# Patient Record
Sex: Female | Born: 1955 | Race: Black or African American | Hispanic: No | Smoking: Never smoker
Health system: Southern US, Community
[De-identification: ages and names within clinical notes are randomized; demographics above are authoritative.]

## PROBLEM LIST (undated history)

## (undated) DIAGNOSIS — M542 Cervicalgia: Secondary | ICD-10-CM

## (undated) DIAGNOSIS — F419 Anxiety disorder, unspecified: Secondary | ICD-10-CM

## (undated) DIAGNOSIS — N200 Calculus of kidney: Secondary | ICD-10-CM

## (undated) DIAGNOSIS — J939 Pneumothorax, unspecified: Secondary | ICD-10-CM

## (undated) DIAGNOSIS — M419 Scoliosis, unspecified: Secondary | ICD-10-CM

## (undated) DIAGNOSIS — N19 Unspecified kidney failure: Secondary | ICD-10-CM

## (undated) DIAGNOSIS — R011 Cardiac murmur, unspecified: Secondary | ICD-10-CM

## (undated) DIAGNOSIS — J45909 Unspecified asthma, uncomplicated: Secondary | ICD-10-CM

## (undated) DIAGNOSIS — M1990 Unspecified osteoarthritis, unspecified site: Secondary | ICD-10-CM

## (undated) DIAGNOSIS — I10 Essential (primary) hypertension: Secondary | ICD-10-CM

## (undated) DIAGNOSIS — A048 Other specified bacterial intestinal infections: Secondary | ICD-10-CM

## (undated) DIAGNOSIS — K219 Gastro-esophageal reflux disease without esophagitis: Secondary | ICD-10-CM

## (undated) DIAGNOSIS — N952 Postmenopausal atrophic vaginitis: Secondary | ICD-10-CM

## (undated) DIAGNOSIS — J189 Pneumonia, unspecified organism: Secondary | ICD-10-CM

## (undated) DIAGNOSIS — I639 Cerebral infarction, unspecified: Secondary | ICD-10-CM

## (undated) DIAGNOSIS — J40 Bronchitis, not specified as acute or chronic: Secondary | ICD-10-CM

## (undated) DIAGNOSIS — R310 Gross hematuria: Secondary | ICD-10-CM

## (undated) DIAGNOSIS — J302 Other seasonal allergic rhinitis: Secondary | ICD-10-CM

## (undated) DIAGNOSIS — R350 Frequency of micturition: Secondary | ICD-10-CM

## (undated) DIAGNOSIS — J984 Other disorders of lung: Secondary | ICD-10-CM

## (undated) HISTORY — PX: NECK SURGERY: SHX720

## (undated) HISTORY — PX: BREAST BIOPSY: SHX20

## (undated) HISTORY — DX: Calculus of kidney: N20.0

## (undated) HISTORY — DX: Essential (primary) hypertension: I10

## (undated) HISTORY — DX: Pneumothorax, unspecified: J93.9

## (undated) HISTORY — PX: CORONARY ARTERY BYPASS GRAFT: SHX141

## (undated) HISTORY — PX: APPENDECTOMY: SHX54

## (undated) HISTORY — DX: Postmenopausal atrophic vaginitis: N95.2

## (undated) HISTORY — DX: Gastro-esophageal reflux disease without esophagitis: K21.9

## (undated) HISTORY — DX: Bronchitis, not specified as acute or chronic: J40

## (undated) HISTORY — DX: Other disorders of lung: J98.4

## (undated) HISTORY — DX: Unspecified osteoarthritis, unspecified site: M19.90

## (undated) HISTORY — DX: Cervicalgia: M54.2

## (undated) HISTORY — DX: Anxiety disorder, unspecified: F41.9

## (undated) HISTORY — DX: Cardiac murmur, unspecified: R01.1

## (undated) HISTORY — DX: Scoliosis, unspecified: M41.9

## (undated) HISTORY — DX: Pneumonia, unspecified organism: J18.9

## (undated) HISTORY — PX: FOOT SURGERY: SHX648

## (undated) HISTORY — DX: Frequency of micturition: R35.0

## (undated) HISTORY — PX: ABDOMINAL HYSTERECTOMY: SHX81

## (undated) HISTORY — DX: Gross hematuria: R31.0

## (undated) HISTORY — DX: Other specified bacterial intestinal infections: A04.8

## (undated) HISTORY — DX: Unspecified asthma, uncomplicated: J45.909

## (undated) HISTORY — DX: Unspecified kidney failure: N19

---

## 2001-12-06 ENCOUNTER — Ambulatory Visit (HOSPITAL_COMMUNITY): Admission: RE | Admit: 2001-12-06 | Discharge: 2001-12-06 | Payer: Self-pay | Admitting: Orthopedic Surgery

## 2001-12-06 ENCOUNTER — Encounter: Payer: Self-pay | Admitting: Orthopedic Surgery

## 2001-12-22 ENCOUNTER — Encounter: Admission: RE | Admit: 2001-12-22 | Discharge: 2002-01-25 | Payer: Self-pay | Admitting: Orthopedic Surgery

## 2002-01-08 ENCOUNTER — Ambulatory Visit (HOSPITAL_COMMUNITY): Admission: RE | Admit: 2002-01-08 | Discharge: 2002-01-08 | Payer: Self-pay | Admitting: Orthopedic Surgery

## 2002-01-15 ENCOUNTER — Ambulatory Visit (HOSPITAL_COMMUNITY): Admission: RE | Admit: 2002-01-15 | Discharge: 2002-01-15 | Payer: Self-pay | Admitting: Orthopedic Surgery

## 2002-01-15 ENCOUNTER — Encounter: Payer: Self-pay | Admitting: Orthopedic Surgery

## 2002-03-26 ENCOUNTER — Ambulatory Visit (HOSPITAL_COMMUNITY): Admission: RE | Admit: 2002-03-26 | Discharge: 2002-03-26 | Payer: Self-pay | Admitting: Orthopedic Surgery

## 2002-03-26 ENCOUNTER — Encounter: Payer: Self-pay | Admitting: Orthopedic Surgery

## 2002-12-30 ENCOUNTER — Encounter: Admission: RE | Admit: 2002-12-30 | Discharge: 2002-12-30 | Payer: Self-pay | Admitting: Neurosurgery

## 2002-12-30 ENCOUNTER — Encounter: Payer: Self-pay | Admitting: Neurosurgery

## 2003-02-08 ENCOUNTER — Ambulatory Visit (HOSPITAL_COMMUNITY): Admission: RE | Admit: 2003-02-08 | Discharge: 2003-02-09 | Payer: Self-pay | Admitting: Neurosurgery

## 2003-04-14 ENCOUNTER — Encounter: Admission: RE | Admit: 2003-04-14 | Discharge: 2003-04-14 | Payer: Self-pay | Admitting: Neurosurgery

## 2003-11-04 ENCOUNTER — Encounter: Admission: RE | Admit: 2003-11-04 | Discharge: 2003-11-04 | Payer: Self-pay | Admitting: Neurosurgery

## 2004-01-11 ENCOUNTER — Ambulatory Visit: Payer: Self-pay | Admitting: Pain Medicine

## 2004-02-07 ENCOUNTER — Ambulatory Visit: Payer: Self-pay | Admitting: Pain Medicine

## 2004-02-15 ENCOUNTER — Ambulatory Visit: Payer: Self-pay | Admitting: Pain Medicine

## 2004-03-07 ENCOUNTER — Encounter: Admission: RE | Admit: 2004-03-07 | Discharge: 2004-03-07 | Payer: Self-pay | Admitting: Neurosurgery

## 2004-03-15 ENCOUNTER — Ambulatory Visit: Payer: Self-pay | Admitting: Pain Medicine

## 2004-03-28 ENCOUNTER — Ambulatory Visit: Payer: Self-pay | Admitting: Pain Medicine

## 2004-04-12 ENCOUNTER — Ambulatory Visit: Payer: Self-pay | Admitting: Pain Medicine

## 2004-04-23 ENCOUNTER — Ambulatory Visit: Payer: Self-pay | Admitting: Pain Medicine

## 2004-05-29 ENCOUNTER — Ambulatory Visit: Payer: Self-pay | Admitting: Pain Medicine

## 2004-05-29 ENCOUNTER — Ambulatory Visit: Payer: Self-pay

## 2004-06-04 ENCOUNTER — Ambulatory Visit: Payer: Self-pay | Admitting: Pain Medicine

## 2004-07-05 ENCOUNTER — Ambulatory Visit: Payer: Self-pay | Admitting: Pain Medicine

## 2004-07-11 ENCOUNTER — Ambulatory Visit: Payer: Self-pay | Admitting: Pain Medicine

## 2004-07-18 ENCOUNTER — Ambulatory Visit: Payer: Self-pay | Admitting: Pain Medicine

## 2004-07-31 ENCOUNTER — Ambulatory Visit: Payer: Self-pay | Admitting: Pain Medicine

## 2004-08-27 ENCOUNTER — Ambulatory Visit: Payer: Self-pay | Admitting: Pain Medicine

## 2004-09-18 ENCOUNTER — Emergency Department: Payer: Self-pay | Admitting: Emergency Medicine

## 2004-09-24 ENCOUNTER — Ambulatory Visit: Payer: Self-pay | Admitting: Pain Medicine

## 2004-10-30 ENCOUNTER — Ambulatory Visit: Payer: Self-pay | Admitting: Pain Medicine

## 2004-10-31 ENCOUNTER — Ambulatory Visit: Payer: Self-pay | Admitting: Pain Medicine

## 2004-12-03 ENCOUNTER — Ambulatory Visit: Payer: Self-pay | Admitting: Pain Medicine

## 2004-12-10 ENCOUNTER — Ambulatory Visit: Payer: Self-pay | Admitting: Pain Medicine

## 2005-01-15 ENCOUNTER — Ambulatory Visit: Payer: Self-pay | Admitting: Pain Medicine

## 2005-01-23 ENCOUNTER — Ambulatory Visit: Payer: Self-pay | Admitting: Pain Medicine

## 2005-02-12 ENCOUNTER — Ambulatory Visit: Payer: Self-pay | Admitting: Pain Medicine

## 2005-02-20 ENCOUNTER — Ambulatory Visit: Payer: Self-pay | Admitting: Pain Medicine

## 2005-03-14 ENCOUNTER — Ambulatory Visit: Payer: Self-pay | Admitting: Pain Medicine

## 2005-04-10 ENCOUNTER — Ambulatory Visit: Payer: Self-pay | Admitting: Pain Medicine

## 2005-05-09 ENCOUNTER — Ambulatory Visit: Payer: Self-pay | Admitting: Pain Medicine

## 2005-05-13 ENCOUNTER — Emergency Department: Payer: Self-pay | Admitting: Emergency Medicine

## 2005-05-15 ENCOUNTER — Ambulatory Visit: Payer: Self-pay | Admitting: Pain Medicine

## 2005-06-06 ENCOUNTER — Ambulatory Visit: Payer: Self-pay | Admitting: Pain Medicine

## 2005-06-12 ENCOUNTER — Ambulatory Visit: Payer: Self-pay | Admitting: Pain Medicine

## 2005-06-18 ENCOUNTER — Encounter: Payer: Self-pay | Admitting: Pain Medicine

## 2005-08-28 ENCOUNTER — Ambulatory Visit: Payer: Self-pay | Admitting: Pain Medicine

## 2005-09-13 ENCOUNTER — Ambulatory Visit: Payer: Self-pay | Admitting: Family Medicine

## 2005-10-01 ENCOUNTER — Ambulatory Visit: Payer: Self-pay | Admitting: Pain Medicine

## 2005-10-21 ENCOUNTER — Ambulatory Visit: Payer: Self-pay | Admitting: Pain Medicine

## 2005-11-07 ENCOUNTER — Ambulatory Visit: Payer: Self-pay | Admitting: Pain Medicine

## 2005-12-02 ENCOUNTER — Ambulatory Visit: Payer: Self-pay | Admitting: Pain Medicine

## 2006-01-02 ENCOUNTER — Ambulatory Visit: Payer: Self-pay | Admitting: Pain Medicine

## 2006-01-08 ENCOUNTER — Ambulatory Visit: Payer: Self-pay | Admitting: Pain Medicine

## 2006-02-25 ENCOUNTER — Ambulatory Visit: Payer: Self-pay | Admitting: Pain Medicine

## 2006-04-01 ENCOUNTER — Ambulatory Visit: Payer: Self-pay | Admitting: Pain Medicine

## 2006-04-14 ENCOUNTER — Ambulatory Visit: Payer: Self-pay | Admitting: Pain Medicine

## 2006-05-07 ENCOUNTER — Ambulatory Visit: Payer: Self-pay | Admitting: Pain Medicine

## 2006-05-19 ENCOUNTER — Ambulatory Visit: Payer: Self-pay | Admitting: Pain Medicine

## 2006-06-03 ENCOUNTER — Ambulatory Visit: Payer: Self-pay | Admitting: Pain Medicine

## 2006-06-11 ENCOUNTER — Ambulatory Visit: Payer: Self-pay | Admitting: Pain Medicine

## 2006-06-20 ENCOUNTER — Emergency Department: Payer: Self-pay | Admitting: Emergency Medicine

## 2006-07-01 ENCOUNTER — Ambulatory Visit: Payer: Self-pay | Admitting: Pain Medicine

## 2006-09-08 ENCOUNTER — Ambulatory Visit: Payer: Self-pay | Admitting: Pain Medicine

## 2006-10-02 ENCOUNTER — Ambulatory Visit: Payer: Self-pay | Admitting: Pain Medicine

## 2007-01-13 ENCOUNTER — Ambulatory Visit: Payer: Self-pay | Admitting: Pain Medicine

## 2007-01-21 ENCOUNTER — Ambulatory Visit: Payer: Self-pay | Admitting: Pain Medicine

## 2007-02-10 ENCOUNTER — Ambulatory Visit: Payer: Self-pay | Admitting: Pain Medicine

## 2007-05-07 ENCOUNTER — Ambulatory Visit: Payer: Self-pay | Admitting: Family Medicine

## 2007-05-19 ENCOUNTER — Ambulatory Visit: Payer: Self-pay | Admitting: Family Medicine

## 2007-06-10 ENCOUNTER — Ambulatory Visit: Payer: Self-pay | Admitting: Gastroenterology

## 2011-01-05 ENCOUNTER — Emergency Department: Payer: Self-pay | Admitting: *Deleted

## 2011-01-10 ENCOUNTER — Ambulatory Visit: Payer: Self-pay | Admitting: Family Medicine

## 2011-06-01 ENCOUNTER — Emergency Department: Payer: Self-pay | Admitting: Emergency Medicine

## 2011-06-19 ENCOUNTER — Ambulatory Visit: Payer: Self-pay | Admitting: Family Medicine

## 2011-06-21 ENCOUNTER — Inpatient Hospital Stay: Payer: Self-pay | Admitting: Internal Medicine

## 2011-06-21 LAB — COMPREHENSIVE METABOLIC PANEL
Albumin: 3.4 g/dL (ref 3.4–5.0)
Alkaline Phosphatase: 84 U/L (ref 50–136)
Anion Gap: 11 (ref 7–16)
BUN: 9 mg/dL (ref 7–18)
Bilirubin,Total: 0.4 mg/dL (ref 0.2–1.0)
Calcium, Total: 8.4 mg/dL — ABNORMAL LOW (ref 8.5–10.1)
Chloride: 108 mmol/L — ABNORMAL HIGH (ref 98–107)
Co2: 25 mmol/L (ref 21–32)
Creatinine: 0.82 mg/dL (ref 0.60–1.30)
EGFR (African American): >60
EGFR (Non-African Amer.): >60
Glucose: 87 mg/dL (ref 65–99)
Osmolality: 285 (ref 275–301)
Potassium: 3.9 mmol/L (ref 3.5–5.1)
SGOT(AST): 60 U/L — ABNORMAL HIGH (ref 15–37)
SGPT (ALT): 67 U/L
Sodium: 144 mmol/L (ref 136–145)
Total Protein: 7.4 g/dL (ref 6.4–8.2)

## 2011-06-21 LAB — CBC
HCT: 34.7 % — ABNORMAL LOW (ref 35.0–47.0)
HGB: 11.5 g/dL — ABNORMAL LOW (ref 12.0–16.0)
MCH: 30.1 pg (ref 26.0–34.0)
MCHC: 33.2 g/dL (ref 32.0–36.0)
MCV: 91 fL (ref 80–100)
RBC: 3.83 10*6/uL (ref 3.80–5.20)
RDW: 14.3 % (ref 11.5–14.5)
WBC: 11.3 10*3/uL — ABNORMAL HIGH (ref 3.6–11.0)

## 2011-06-21 LAB — RAPID HIV-1/2 QL/CONFIRM: HIV-1/2,Rapid Ql: NEGATIVE

## 2011-06-22 LAB — BASIC METABOLIC PANEL
Anion Gap: 11 (ref 7–16)
BUN: 12 mg/dL (ref 7–18)
Calcium, Total: 9.1 mg/dL (ref 8.5–10.1)
Chloride: 106 mmol/L (ref 98–107)
Co2: 24 mmol/L (ref 21–32)
Creatinine: 0.83 mg/dL (ref 0.60–1.30)
EGFR (African American): >60
EGFR (Non-African Amer.): >60
Glucose: 155 mg/dL — ABNORMAL HIGH (ref 65–99)
Osmolality: 284 (ref 275–301)
Potassium: 3.7 mmol/L (ref 3.5–5.1)
Sodium: 141 mmol/L (ref 136–145)

## 2011-06-26 LAB — CULTURE, BLOOD (SINGLE)

## 2011-06-27 ENCOUNTER — Institutional Professional Consult (permissible substitution): Payer: Self-pay | Admitting: Internal Medicine

## 2011-07-18 ENCOUNTER — Ambulatory Visit: Payer: Self-pay | Admitting: Family Medicine

## 2011-12-10 ENCOUNTER — Ambulatory Visit: Payer: Self-pay | Admitting: Family Medicine

## 2012-01-14 ENCOUNTER — Emergency Department: Payer: Self-pay | Admitting: Emergency Medicine

## 2012-01-14 LAB — COMPREHENSIVE METABOLIC PANEL
Albumin: 3.8 g/dL (ref 3.4–5.0)
Alkaline Phosphatase: 63 U/L (ref 50–136)
Anion Gap: 9 (ref 7–16)
BUN: 9 mg/dL (ref 7–18)
Bilirubin,Total: 0.3 mg/dL (ref 0.2–1.0)
Calcium, Total: 8.8 mg/dL (ref 8.5–10.1)
Chloride: 109 mmol/L — ABNORMAL HIGH (ref 98–107)
Co2: 27 mmol/L (ref 21–32)
Creatinine: 0.89 mg/dL (ref 0.60–1.30)
EGFR (African American): >60
EGFR (Non-African Amer.): >60
Glucose: 87 mg/dL (ref 65–99)
Osmolality: 287 (ref 275–301)
Potassium: 3.9 mmol/L (ref 3.5–5.1)
SGOT(AST): 24 U/L (ref 15–37)
SGPT (ALT): 21 U/L (ref 12–78)
Sodium: 145 mmol/L (ref 136–145)
Total Protein: 7.7 g/dL (ref 6.4–8.2)

## 2012-01-14 LAB — CBC
HCT: 35.6 % (ref 35.0–47.0)
HGB: 12 g/dL (ref 12.0–16.0)
MCH: 30.3 pg (ref 26.0–34.0)
MCHC: 33.8 g/dL (ref 32.0–36.0)
MCV: 90 fL (ref 80–100)
Platelet: 318 10*3/uL (ref 150–440)
RBC: 3.96 10*6/uL (ref 3.80–5.20)
RDW: 13.4 % (ref 11.5–14.5)
WBC: 9.8 10*3/uL (ref 3.6–11.0)

## 2012-01-14 LAB — CK TOTAL AND CKMB (NOT AT ARMC)
CK, Total: 210 U/L (ref 21–215)
CK-MB: 1.3 ng/mL (ref 0.5–3.6)

## 2012-01-14 LAB — TROPONIN I: Troponin-I: 0.03 ng/mL

## 2012-07-17 ENCOUNTER — Emergency Department: Payer: Self-pay | Admitting: Emergency Medicine

## 2012-07-17 LAB — BASIC METABOLIC PANEL
Anion Gap: 3 — ABNORMAL LOW (ref 7–16)
BUN: 11 mg/dL (ref 7–18)
Calcium, Total: 9.2 mg/dL (ref 8.5–10.1)
Chloride: 107 mmol/L (ref 98–107)
Co2: 31 mmol/L (ref 21–32)
Creatinine: 0.72 mg/dL (ref 0.60–1.30)
EGFR (African American): >60
EGFR (Non-African Amer.): >60
Glucose: 79 mg/dL (ref 65–99)
Osmolality: 280 (ref 275–301)
Potassium: 3.3 mmol/L — ABNORMAL LOW (ref 3.5–5.1)
Sodium: 141 mmol/L (ref 136–145)

## 2012-07-17 LAB — CBC
HCT: 36.3 % (ref 35.0–47.0)
HGB: 12.1 g/dL (ref 12.0–16.0)
MCH: 29.5 pg (ref 26.0–34.0)
MCHC: 33.4 g/dL (ref 32.0–36.0)
MCV: 89 fL (ref 80–100)
Platelet: 308 10*3/uL (ref 150–440)
RBC: 4.11 10*6/uL (ref 3.80–5.20)
RDW: 13.1 % (ref 11.5–14.5)
WBC: 6.4 10*3/uL (ref 3.6–11.0)

## 2012-07-17 LAB — TROPONIN I: Troponin-I: <0.02 ng/mL

## 2012-10-23 ENCOUNTER — Ambulatory Visit: Payer: Self-pay | Admitting: Family Medicine

## 2013-02-24 ENCOUNTER — Ambulatory Visit: Payer: Self-pay | Admitting: Family Medicine

## 2013-03-09 ENCOUNTER — Emergency Department: Payer: Self-pay | Admitting: Emergency Medicine

## 2013-04-23 ENCOUNTER — Ambulatory Visit: Payer: Self-pay | Admitting: Family Medicine

## 2013-07-21 ENCOUNTER — Ambulatory Visit: Payer: Self-pay | Admitting: Family Medicine

## 2013-07-22 ENCOUNTER — Inpatient Hospital Stay: Payer: Self-pay | Admitting: Internal Medicine

## 2013-07-22 LAB — COMPREHENSIVE METABOLIC PANEL
Albumin: 3.6 g/dL (ref 3.4–5.0)
Alkaline Phosphatase: 94 U/L
Anion Gap: 5 — ABNORMAL LOW (ref 7–16)
BUN: 6 mg/dL — ABNORMAL LOW (ref 7–18)
Bilirubin,Total: 0.4 mg/dL (ref 0.2–1.0)
Calcium, Total: 8.9 mg/dL (ref 8.5–10.1)
Chloride: 104 mmol/L (ref 98–107)
Co2: 28 mmol/L (ref 21–32)
Creatinine: 0.94 mg/dL (ref 0.60–1.30)
EGFR (African American): >60
EGFR (Non-African Amer.): >60
Glucose: 98 mg/dL (ref 65–99)
Osmolality: 271 (ref 275–301)
Potassium: 3.6 mmol/L (ref 3.5–5.1)
SGOT(AST): 40 U/L — ABNORMAL HIGH (ref 15–37)
SGPT (ALT): 36 U/L (ref 12–78)
Sodium: 137 mmol/L (ref 136–145)
Total Protein: 7.8 g/dL (ref 6.4–8.2)

## 2013-07-22 LAB — CK TOTAL AND CKMB (NOT AT ARMC)
CK, Total: 61 U/L
CK-MB: <0.5 ng/mL — ABNORMAL LOW (ref 0.5–3.6)

## 2013-07-22 LAB — CBC
HCT: 39.6 % (ref 35.0–47.0)
HGB: 13 g/dL (ref 12.0–16.0)
MCH: 29.5 pg (ref 26.0–34.0)
MCHC: 32.8 g/dL (ref 32.0–36.0)
MCV: 90 fL (ref 80–100)
Platelet: 306 10*3/uL (ref 150–440)
RBC: 4.4 10*6/uL (ref 3.80–5.20)
RDW: 12.8 % (ref 11.5–14.5)
WBC: 15.6 10*3/uL — ABNORMAL HIGH (ref 3.6–11.0)

## 2013-07-22 LAB — RAPID HIV-1/2 QL/CONFIRM: HIV-1/2,Rapid Ql: NEGATIVE

## 2013-07-22 LAB — TROPONIN I: Troponin-I: <0.02 ng/mL

## 2013-07-22 LAB — RAPID INFLUENZA A&B ANTIGENS

## 2013-07-23 LAB — URINALYSIS, COMPLETE
Bacteria: NONE SEEN
Bilirubin,UR: NEGATIVE
Blood: NEGATIVE
Glucose,UR: NEGATIVE mg/dL (ref 0–75)
Ketone: NEGATIVE
Leukocyte Esterase: NEGATIVE
Nitrite: NEGATIVE
Ph: 6 (ref 4.5–8.0)
Protein: NEGATIVE
RBC,UR: NONE SEEN /HPF (ref 0–5)
Specific Gravity: 1.009 (ref 1.003–1.030)
Squamous Epithelial: <1
WBC UR: 1 /HPF (ref 0–5)

## 2013-07-23 LAB — COMPREHENSIVE METABOLIC PANEL
Albumin: 2.7 g/dL — ABNORMAL LOW (ref 3.4–5.0)
Alkaline Phosphatase: 80 U/L
Anion Gap: 5 — ABNORMAL LOW (ref 7–16)
BUN: 5 mg/dL — ABNORMAL LOW (ref 7–18)
Bilirubin,Total: 0.8 mg/dL (ref 0.2–1.0)
Calcium, Total: 8.5 mg/dL (ref 8.5–10.1)
Chloride: 108 mmol/L — ABNORMAL HIGH (ref 98–107)
Co2: 26 mmol/L (ref 21–32)
Creatinine: 0.98 mg/dL (ref 0.60–1.30)
EGFR (African American): >60
EGFR (Non-African Amer.): >60
Glucose: 96 mg/dL (ref 65–99)
Osmolality: 275 (ref 275–301)
Potassium: 3.5 mmol/L (ref 3.5–5.1)
SGOT(AST): 23 U/L (ref 15–37)
SGPT (ALT): 24 U/L (ref 12–78)
Sodium: 139 mmol/L (ref 136–145)
Total Protein: 6.3 g/dL — ABNORMAL LOW (ref 6.4–8.2)

## 2013-07-23 LAB — CBC WITH DIFFERENTIAL/PLATELET
Basophil #: 0 10*3/uL (ref 0.0–0.1)
Basophil %: 0.2 %
Eosinophil #: 0.1 10*3/uL (ref 0.0–0.7)
Eosinophil %: 0.3 %
HCT: 33.9 % — ABNORMAL LOW (ref 35.0–47.0)
HGB: 11.1 g/dL — ABNORMAL LOW (ref 12.0–16.0)
Lymphocyte #: 1.7 10*3/uL (ref 1.0–3.6)
Lymphocyte %: 9.7 %
MCH: 29.7 pg (ref 26.0–34.0)
MCHC: 32.9 g/dL (ref 32.0–36.0)
MCV: 90 fL (ref 80–100)
Monocyte #: 0.7 x10 3/mm (ref 0.2–0.9)
Monocyte %: 3.9 %
Neutrophil #: 15.1 10*3/uL — ABNORMAL HIGH (ref 1.4–6.5)
Neutrophil %: 85.9 %
Platelet: 237 10*3/uL (ref 150–440)
RBC: 3.75 10*6/uL — ABNORMAL LOW (ref 3.80–5.20)
RDW: 13 % (ref 11.5–14.5)
WBC: 17.6 10*3/uL — ABNORMAL HIGH (ref 3.6–11.0)

## 2013-07-24 LAB — CBC WITH DIFFERENTIAL/PLATELET
Basophil #: 0 10*3/uL (ref 0.0–0.1)
Basophil %: 0.1 %
Eosinophil #: 0 10*3/uL (ref 0.0–0.7)
Eosinophil %: 0 %
HCT: 36.6 % (ref 35.0–47.0)
HGB: 11.7 g/dL — ABNORMAL LOW (ref 12.0–16.0)
Lymphocyte #: 0.9 10*3/uL — ABNORMAL LOW (ref 1.0–3.6)
Lymphocyte %: 4.1 %
MCH: 28.8 pg (ref 26.0–34.0)
MCHC: 31.9 g/dL — ABNORMAL LOW (ref 32.0–36.0)
MCV: 90 fL (ref 80–100)
Monocyte #: 0.3 x10 3/mm (ref 0.2–0.9)
Monocyte %: 1.5 %
Neutrophil #: 19.7 10*3/uL — ABNORMAL HIGH (ref 1.4–6.5)
Neutrophil %: 94.3 %
Platelet: 275 10*3/uL (ref 150–440)
RBC: 4.05 10*6/uL (ref 3.80–5.20)
RDW: 13 % (ref 11.5–14.5)
WBC: 20.9 10*3/uL — ABNORMAL HIGH (ref 3.6–11.0)

## 2013-07-24 LAB — BASIC METABOLIC PANEL
Anion Gap: 6 — ABNORMAL LOW (ref 7–16)
BUN: 5 mg/dL — ABNORMAL LOW (ref 7–18)
Calcium, Total: 9.2 mg/dL (ref 8.5–10.1)
Chloride: 109 mmol/L — ABNORMAL HIGH (ref 98–107)
Co2: 26 mmol/L (ref 21–32)
Creatinine: 0.88 mg/dL (ref 0.60–1.30)
EGFR (African American): >60
EGFR (Non-African Amer.): >60
Glucose: 145 mg/dL — ABNORMAL HIGH (ref 65–99)
Osmolality: 281 (ref 275–301)
Potassium: 3.6 mmol/L (ref 3.5–5.1)
Sodium: 141 mmol/L (ref 136–145)

## 2013-07-24 LAB — VANCOMYCIN, TROUGH: Vancomycin, Trough: 8 ug/mL — ABNORMAL LOW (ref 10–20)

## 2013-07-24 LAB — URINE CULTURE

## 2013-07-25 LAB — CBC WITH DIFFERENTIAL/PLATELET
Basophil #: 0.1 10*3/uL (ref 0.0–0.1)
Basophil %: 0.4 %
Eosinophil #: 0 10*3/uL (ref 0.0–0.7)
Eosinophil %: 0.1 %
HCT: 36.2 % (ref 35.0–47.0)
HGB: 12 g/dL (ref 12.0–16.0)
Lymphocyte #: 1 10*3/uL (ref 1.0–3.6)
Lymphocyte %: 3.6 %
MCH: 29.7 pg (ref 26.0–34.0)
MCHC: 33.2 g/dL (ref 32.0–36.0)
MCV: 89 fL (ref 80–100)
Monocyte #: 0.8 x10 3/mm (ref 0.2–0.9)
Monocyte %: 2.6 %
Neutrophil #: 27.4 10*3/uL — ABNORMAL HIGH (ref 1.4–6.5)
Neutrophil %: 93.3 %
Platelet: 356 10*3/uL (ref 150–440)
RBC: 4.06 10*6/uL (ref 3.80–5.20)
RDW: 13.1 % (ref 11.5–14.5)
WBC: 29.3 10*3/uL — ABNORMAL HIGH (ref 3.6–11.0)

## 2013-07-25 LAB — VANCOMYCIN, TROUGH: Vancomycin, Trough: 14 ug/mL (ref 10–20)

## 2013-07-26 LAB — CBC WITH DIFFERENTIAL/PLATELET
Basophil #: 0.1 10*3/uL (ref 0.0–0.1)
Basophil %: 0.3 %
Eosinophil #: 0 10*3/uL (ref 0.0–0.7)
Eosinophil %: 0 %
HCT: 36 % (ref 35.0–47.0)
HGB: 12.1 g/dL (ref 12.0–16.0)
Lymphocyte #: 1.5 10*3/uL (ref 1.0–3.6)
Lymphocyte %: 7.3 %
MCH: 29.8 pg (ref 26.0–34.0)
MCHC: 33.6 g/dL (ref 32.0–36.0)
MCV: 89 fL (ref 80–100)
Monocyte #: 1.1 x10 3/mm — ABNORMAL HIGH (ref 0.2–0.9)
Monocyte %: 5.6 %
Neutrophil #: 17.7 10*3/uL — ABNORMAL HIGH (ref 1.4–6.5)
Neutrophil %: 86.8 %
Platelet: 381 10*3/uL (ref 150–440)
RBC: 4.06 10*6/uL (ref 3.80–5.20)
RDW: 13 % (ref 11.5–14.5)
WBC: 20.3 10*3/uL — ABNORMAL HIGH (ref 3.6–11.0)

## 2013-07-26 LAB — CREATININE, SERUM
Creatinine: 0.79 mg/dL (ref 0.60–1.30)
EGFR (African American): >60
EGFR (Non-African Amer.): >60

## 2013-07-26 LAB — VANCOMYCIN, TROUGH: Vancomycin, Trough: 14 ug/mL (ref 10–20)

## 2013-07-27 LAB — CREATININE, SERUM
Creatinine: 0.87 mg/dL (ref 0.60–1.30)
EGFR (African American): >60
EGFR (Non-African Amer.): >60

## 2013-07-27 LAB — CLOSTRIDIUM DIFFICILE(ARMC)

## 2013-07-27 LAB — CBC WITH DIFFERENTIAL/PLATELET
Bands: 2 %
Comment - H1-Com1: NORMAL
HCT: 35.7 % (ref 35.0–47.0)
HGB: 11.8 g/dL — ABNORMAL LOW (ref 12.0–16.0)
Lymphocytes: 26 %
MCH: 29.4 pg (ref 26.0–34.0)
MCHC: 33.1 g/dL (ref 32.0–36.0)
MCV: 89 fL (ref 80–100)
Metamyelocyte: 1 %
Monocytes: 10 %
Myelocyte: 2 %
Platelet: 330 10*3/uL (ref 150–440)
RBC: 4.02 10*6/uL (ref 3.80–5.20)
RDW: 12.7 % (ref 11.5–14.5)
Segmented Neutrophils: 59 %
WBC: 15 10*3/uL — ABNORMAL HIGH (ref 3.6–11.0)

## 2013-07-27 LAB — CULTURE, BLOOD (SINGLE)

## 2013-07-27 LAB — VANCOMYCIN, TROUGH: Vancomycin, Trough: 41 ug/mL (ref 10–20)

## 2013-07-28 DIAGNOSIS — I319 Disease of pericardium, unspecified: Secondary | ICD-10-CM

## 2013-07-28 LAB — CBC WITH DIFFERENTIAL/PLATELET
Bands: 3 %
Comment - H1-Com2: NORMAL
HCT: 35.9 % (ref 35.0–47.0)
HGB: 12 g/dL (ref 12.0–16.0)
Lymphocytes: 15 %
MCH: 29.6 pg (ref 26.0–34.0)
MCHC: 33.5 g/dL (ref 32.0–36.0)
MCV: 88 fL (ref 80–100)
MYELOCYTE: 1 %
Metamyelocyte: 1 %
Monocytes: 11 %
Platelet: 345 10*3/uL (ref 150–440)
RBC: 4.06 10*6/uL (ref 3.80–5.20)
RDW: 12.5 % (ref 11.5–14.5)
Segmented Neutrophils: 69 %
WBC: 17.7 10*3/uL — AB (ref 3.6–11.0)

## 2013-07-28 LAB — VANCOMYCIN, RANDOM
VANCOMYCIN, RANDOM: 35 ug/mL
Vancomycin, Random: 35 ug/mL

## 2013-07-28 LAB — EXPECTORATED SPUTUM ASSESSMENT W GRAM STAIN, RFLX TO RESP C

## 2013-07-28 LAB — CREATININE, SERUM
CREATININE: 4 mg/dL — AB (ref 0.60–1.30)
GFR CALC AF AMER: 14 — AB
GFR CALC NON AF AMER: 12 — AB

## 2013-07-29 LAB — CBC WITH DIFFERENTIAL/PLATELET
Basophil #: 0 10*3/uL (ref 0.0–0.1)
Basophil %: 0.1 %
EOS ABS: 0 10*3/uL (ref 0.0–0.7)
Eosinophil %: 0.2 %
HCT: 34.7 % — AB (ref 35.0–47.0)
HGB: 11.3 g/dL — ABNORMAL LOW (ref 12.0–16.0)
LYMPHS ABS: 2.4 10*3/uL (ref 1.0–3.6)
Lymphocyte %: 12.5 %
MCH: 28.7 pg (ref 26.0–34.0)
MCHC: 32.4 g/dL (ref 32.0–36.0)
MCV: 88 fL (ref 80–100)
Monocyte #: 2 x10 3/mm — ABNORMAL HIGH (ref 0.2–0.9)
Monocyte %: 10.7 %
NEUTROS ABS: 14.5 10*3/uL — AB (ref 1.4–6.5)
Neutrophil %: 76.5 %
PLATELETS: 351 10*3/uL (ref 150–440)
RBC: 3.93 10*6/uL (ref 3.80–5.20)
RDW: 12.7 % (ref 11.5–14.5)
WBC: 18.9 10*3/uL — AB (ref 3.6–11.0)

## 2013-07-29 LAB — PROTEIN / CREATININE RATIO, URINE
Creatinine, Urine: 44.9 mg/dL (ref 30.0–125.0)
PROTEIN/CREAT. RATIO: 245 mg/g{creat} — AB (ref 0–200)
Protein, Random Urine: 11 mg/dL (ref 0–12)

## 2013-07-29 LAB — BASIC METABOLIC PANEL
Anion Gap: 8 (ref 7–16)
BUN: 34 mg/dL — ABNORMAL HIGH (ref 7–18)
CALCIUM: 8.1 mg/dL — AB (ref 8.5–10.1)
Chloride: 108 mmol/L — ABNORMAL HIGH (ref 98–107)
Co2: 27 mmol/L (ref 21–32)
Creatinine: 4.84 mg/dL — ABNORMAL HIGH (ref 0.60–1.30)
EGFR (African American): 11 — ABNORMAL LOW
EGFR (Non-African Amer.): 9 — ABNORMAL LOW
Glucose: 98 mg/dL (ref 65–99)
Osmolality: 293 (ref 275–301)
Potassium: 2.6 mmol/L — ABNORMAL LOW (ref 3.5–5.1)
Sodium: 143 mmol/L (ref 136–145)

## 2013-07-29 LAB — SODIUM, URINE, RANDOM: SODIUM, URINE RANDOM: 85 mmol/L (ref 20–110)

## 2013-07-29 LAB — MAGNESIUM: Magnesium: 2.1 mg/dL

## 2013-07-30 LAB — BASIC METABOLIC PANEL
Anion Gap: 9 (ref 7–16)
BUN: 29 mg/dL — ABNORMAL HIGH (ref 7–18)
CALCIUM: 8.4 mg/dL — AB (ref 8.5–10.1)
CHLORIDE: 112 mmol/L — AB (ref 98–107)
CREATININE: 4.47 mg/dL — AB (ref 0.60–1.30)
Co2: 22 mmol/L (ref 21–32)
EGFR (African American): 12 — ABNORMAL LOW
GFR CALC NON AF AMER: 10 — AB
Glucose: 86 mg/dL (ref 65–99)
Osmolality: 290 (ref 275–301)
Potassium: 2.9 mmol/L — ABNORMAL LOW (ref 3.5–5.1)
Sodium: 143 mmol/L (ref 136–145)

## 2013-07-30 LAB — CBC WITH DIFFERENTIAL/PLATELET
Bands: 1 %
Comment - H1-Com1: NORMAL
Comment - H1-Com2: NORMAL
HCT: 34.8 % — AB (ref 35.0–47.0)
HGB: 11.3 g/dL — AB (ref 12.0–16.0)
LYMPHS PCT: 18 %
MCH: 29 pg (ref 26.0–34.0)
MCHC: 32.4 g/dL (ref 32.0–36.0)
MCV: 89 fL (ref 80–100)
METAMYELOCYTE: 1 %
Monocytes: 10 %
PLATELETS: 367 10*3/uL (ref 150–440)
RBC: 3.9 10*6/uL (ref 3.80–5.20)
RDW: 13 % (ref 11.5–14.5)
Segmented Neutrophils: 70 %
WBC: 18.8 10*3/uL — ABNORMAL HIGH (ref 3.6–11.0)

## 2013-07-30 LAB — URINALYSIS, COMPLETE
BACTERIA: NONE SEEN
Bilirubin,UR: NEGATIVE
GLUCOSE, UR: NEGATIVE mg/dL (ref 0–75)
LEUKOCYTE ESTERASE: NEGATIVE
Nitrite: NEGATIVE
PH: 6 (ref 4.5–8.0)
PROTEIN: NEGATIVE
SPECIFIC GRAVITY: 1.008 (ref 1.003–1.030)
Squamous Epithelial: NONE SEEN
WBC UR: 10 /HPF (ref 0–5)

## 2013-07-30 LAB — PROTEIN ELECTROPHORESIS(ARMC)

## 2013-07-31 LAB — CBC WITH DIFFERENTIAL/PLATELET
Basophil #: 0 10*3/uL (ref 0.0–0.1)
Basophil %: 0.1 %
EOS PCT: 0.4 %
Eosinophil #: 0.1 10*3/uL (ref 0.0–0.7)
HCT: 32.8 % — AB (ref 35.0–47.0)
HGB: 10.7 g/dL — ABNORMAL LOW (ref 12.0–16.0)
Lymphocyte #: 2.3 10*3/uL (ref 1.0–3.6)
Lymphocyte %: 14.3 %
MCH: 29.1 pg (ref 26.0–34.0)
MCHC: 32.8 g/dL (ref 32.0–36.0)
MCV: 89 fL (ref 80–100)
MONO ABS: 1.6 x10 3/mm — AB (ref 0.2–0.9)
Monocyte %: 10 %
NEUTROS PCT: 75.2 %
Neutrophil #: 12.3 10*3/uL — ABNORMAL HIGH (ref 1.4–6.5)
Platelet: 344 10*3/uL (ref 150–440)
RBC: 3.69 10*6/uL — AB (ref 3.80–5.20)
RDW: 13.4 % (ref 11.5–14.5)
WBC: 16.4 10*3/uL — AB (ref 3.6–11.0)

## 2013-07-31 LAB — BASIC METABOLIC PANEL
Anion Gap: 6 — ABNORMAL LOW (ref 7–16)
BUN: 28 mg/dL — ABNORMAL HIGH (ref 7–18)
CALCIUM: 8.4 mg/dL — AB (ref 8.5–10.1)
Chloride: 111 mmol/L — ABNORMAL HIGH (ref 98–107)
Co2: 24 mmol/L (ref 21–32)
Creatinine: 3.89 mg/dL — ABNORMAL HIGH (ref 0.60–1.30)
EGFR (African American): 14 — ABNORMAL LOW
EGFR (Non-African Amer.): 12 — ABNORMAL LOW
GLUCOSE: 83 mg/dL (ref 65–99)
OSMOLALITY: 286 (ref 275–301)
Potassium: 3.5 mmol/L (ref 3.5–5.1)
Sodium: 141 mmol/L (ref 136–145)

## 2013-07-31 LAB — BRONCHIAL WASH CULTURE

## 2013-08-01 LAB — BASIC METABOLIC PANEL
ANION GAP: 8 (ref 7–16)
BUN: 22 mg/dL — ABNORMAL HIGH (ref 7–18)
Calcium, Total: 8.2 mg/dL — ABNORMAL LOW (ref 8.5–10.1)
Chloride: 111 mmol/L — ABNORMAL HIGH (ref 98–107)
Co2: 23 mmol/L (ref 21–32)
Creatinine: 3.25 mg/dL — ABNORMAL HIGH (ref 0.60–1.30)
EGFR (Non-African Amer.): 15 — ABNORMAL LOW
GFR CALC AF AMER: 18 — AB
GLUCOSE: 86 mg/dL (ref 65–99)
Osmolality: 286 (ref 275–301)
Potassium: 3.3 mmol/L — ABNORMAL LOW (ref 3.5–5.1)
Sodium: 142 mmol/L (ref 136–145)

## 2013-08-02 LAB — BASIC METABOLIC PANEL
Anion Gap: 7 (ref 7–16)
BUN: 18 mg/dL (ref 7–18)
CHLORIDE: 110 mmol/L — AB (ref 98–107)
CREATININE: 2.95 mg/dL — AB (ref 0.60–1.30)
Calcium, Total: 8.7 mg/dL (ref 8.5–10.1)
Co2: 25 mmol/L (ref 21–32)
EGFR (African American): 20 — ABNORMAL LOW
GFR CALC NON AF AMER: 17 — AB
GLUCOSE: 92 mg/dL (ref 65–99)
Osmolality: 285 (ref 275–301)
POTASSIUM: 3.6 mmol/L (ref 3.5–5.1)
SODIUM: 142 mmol/L (ref 136–145)

## 2013-08-04 LAB — UR PROT ELECTROPHORESIS, URINE RANDOM

## 2013-08-09 DIAGNOSIS — R6510 Systemic inflammatory response syndrome (SIRS) of non-infectious origin without acute organ dysfunction: Secondary | ICD-10-CM | POA: Insufficient documentation

## 2013-08-09 DIAGNOSIS — N189 Chronic kidney disease, unspecified: Secondary | ICD-10-CM | POA: Insufficient documentation

## 2013-08-09 DIAGNOSIS — N179 Acute kidney failure, unspecified: Secondary | ICD-10-CM | POA: Insufficient documentation

## 2013-08-09 DIAGNOSIS — N19 Unspecified kidney failure: Secondary | ICD-10-CM | POA: Insufficient documentation

## 2013-08-14 DIAGNOSIS — A048 Other specified bacterial intestinal infections: Secondary | ICD-10-CM | POA: Insufficient documentation

## 2013-08-14 DIAGNOSIS — K289 Gastrojejunal ulcer, unspecified as acute or chronic, without hemorrhage or perforation: Secondary | ICD-10-CM | POA: Insufficient documentation

## 2013-08-14 DIAGNOSIS — B9681 Helicobacter pylori [H. pylori] as the cause of diseases classified elsewhere: Secondary | ICD-10-CM | POA: Insufficient documentation

## 2013-08-15 DIAGNOSIS — D649 Anemia, unspecified: Secondary | ICD-10-CM | POA: Insufficient documentation

## 2013-08-15 DIAGNOSIS — E079 Disorder of thyroid, unspecified: Secondary | ICD-10-CM | POA: Insufficient documentation

## 2013-08-15 DIAGNOSIS — N183 Chronic kidney disease, stage 3 unspecified: Secondary | ICD-10-CM | POA: Insufficient documentation

## 2013-08-15 DIAGNOSIS — J310 Chronic rhinitis: Secondary | ICD-10-CM | POA: Insufficient documentation

## 2013-08-15 DIAGNOSIS — IMO0001 Reserved for inherently not codable concepts without codable children: Secondary | ICD-10-CM | POA: Insufficient documentation

## 2013-08-15 DIAGNOSIS — E010 Iodine-deficiency related diffuse (endemic) goiter: Secondary | ICD-10-CM | POA: Insufficient documentation

## 2013-08-15 DIAGNOSIS — R748 Abnormal levels of other serum enzymes: Secondary | ICD-10-CM | POA: Insufficient documentation

## 2013-08-15 DIAGNOSIS — R3129 Other microscopic hematuria: Secondary | ICD-10-CM | POA: Insufficient documentation

## 2013-08-15 DIAGNOSIS — A048 Other specified bacterial intestinal infections: Secondary | ICD-10-CM | POA: Insufficient documentation

## 2013-08-18 LAB — CULTURE, FUNGUS WITHOUT SMEAR

## 2013-08-29 DIAGNOSIS — M26629 Arthralgia of temporomandibular joint, unspecified side: Secondary | ICD-10-CM | POA: Insufficient documentation

## 2013-08-31 ENCOUNTER — Ambulatory Visit: Payer: Self-pay | Admitting: Internal Medicine

## 2013-09-23 DIAGNOSIS — M549 Dorsalgia, unspecified: Secondary | ICD-10-CM

## 2013-09-23 DIAGNOSIS — M542 Cervicalgia: Secondary | ICD-10-CM

## 2013-09-23 DIAGNOSIS — G8929 Other chronic pain: Secondary | ICD-10-CM | POA: Insufficient documentation

## 2013-11-02 ENCOUNTER — Ambulatory Visit: Payer: Self-pay | Admitting: Pain Medicine

## 2014-05-11 ENCOUNTER — Ambulatory Visit: Payer: Self-pay | Admitting: Family Medicine

## 2014-06-19 ENCOUNTER — Emergency Department
Admit: 2014-06-19 | Disposition: A | Discharge status: Home / Self Care | Payer: Self-pay | Admitting: Emergency Medicine

## 2014-06-19 LAB — URINALYSIS, COMPLETE
BILIRUBIN, UR: NEGATIVE
Bacteria: NONE SEEN
Blood: NEGATIVE
Glucose,UR: NEGATIVE mg/dL (ref 0–75)
Hyaline Cast: 2
Ketone: NEGATIVE
NITRITE: NEGATIVE
PH: 6 (ref 4.5–8.0)
Protein: NEGATIVE
RBC,UR: 1 /HPF (ref 0–5)
Specific Gravity: 1.019 (ref 1.003–1.030)
Squamous Epithelial: 3
WBC UR: 4 /HPF (ref 0–5)

## 2014-06-27 ENCOUNTER — Emergency Department
Admit: 2014-06-27 | Disposition: A | Discharge status: Home / Self Care | Payer: Self-pay | Admitting: Emergency Medicine

## 2014-06-27 LAB — COMPREHENSIVE METABOLIC PANEL
ALT: 60 U/L — AB
Albumin: 3.5 g/dL
Alkaline Phosphatase: 74 U/L
Anion Gap: 5 — ABNORMAL LOW (ref 7–16)
BUN: 8 mg/dL
Bilirubin,Total: 0.2 mg/dL — ABNORMAL LOW
CO2: 32 mmol/L
Calcium, Total: 8.6 mg/dL — ABNORMAL LOW
Chloride: 102 mmol/L
Creatinine: 0.78 mg/dL
Glucose: 94 mg/dL
Potassium: 4.1 mmol/L
SGOT(AST): 66 U/L — ABNORMAL HIGH
Sodium: 139 mmol/L
TOTAL PROTEIN: 6.4 g/dL — AB

## 2014-06-27 LAB — URINALYSIS, COMPLETE
BACTERIA: NONE SEEN
Bilirubin,UR: NEGATIVE
Blood: NEGATIVE
Glucose,UR: NEGATIVE mg/dL (ref 0–75)
Ketone: NEGATIVE
Leukocyte Esterase: NEGATIVE
Nitrite: NEGATIVE
PH: 7 (ref 4.5–8.0)
Protein: NEGATIVE
Specific Gravity: 1.009 (ref 1.003–1.030)

## 2014-06-27 LAB — LIPASE, BLOOD: Lipase: 27 U/L

## 2014-06-27 LAB — CBC
HCT: 34.8 % — ABNORMAL LOW (ref 35.0–47.0)
HGB: 11.2 g/dL — ABNORMAL LOW (ref 12.0–16.0)
MCH: 29.3 pg (ref 26.0–34.0)
MCHC: 32.3 g/dL (ref 32.0–36.0)
MCV: 91 fL (ref 80–100)
PLATELETS: 322 10*3/uL (ref 150–440)
RBC: 3.83 10*6/uL (ref 3.80–5.20)
RDW: 13.7 % (ref 11.5–14.5)
WBC: 9.4 10*3/uL (ref 3.6–11.0)

## 2014-06-27 LAB — TROPONIN I

## 2014-06-30 ENCOUNTER — Ambulatory Visit
Admit: 2014-06-30 | Disposition: A | Discharge status: Home / Self Care | Payer: Self-pay | Attending: Pain Medicine | Admitting: Pain Medicine

## 2014-07-09 NOTE — Discharge Summary (Signed)
PATIENT NAME:  Rachael Jordan, Rachael Jordan MR#:  [id number] DATE OF BIRTH:  [birth year]  DATE OF ADMISSION:  07/22/2013 DATE OF DISCHARGE:  08/02/2013  ADDENDUM:  This is an addendum to earlier dictated discharge summary on 07/30/2013 by Dr. Demetrios Loll.   FINAL DISCHARGE DIAGNOSES:  1. Pneumonia.  Failed Levaquin as outpatient.  Given total 12 days of therapy in hospital.  2. Acute renal failure, likely due to vancomycin. Slow improvement. Follow with nephrology clinic.  3. Hypertension.  4. Chronic pain.  5. Complaint of abdominal pain. CT scan was negative. Under control with pain medication. Follow with pain clinic.   CONDITION ON DISCHARGE: Stable.   MEDICATION ON DISCHARGE: 1. Promethazine 25 mg oral tablet every 6 hours as needed.  2. Cetirizine 10 mg oral tablet once a day.  3. Oxybutynin 5 mg oral tablet 4 times a day.  4. Acetaminophen and hydrocodone 325/5 mg oral tablet every 6 hours as needed.  5. Omeprazole 40 mg oral delayed-release capsule once a day.  6. Prednisone 10 mg oral once a day for 2 days.  7. Oxycodone 15 mg oral extended-release tablet every 12 hours for 4 days.  8. Metoprolol tartrate 25 mg oral tablet 2 times a day.   DIET ON DISCHARGE: Low-sodium diet.   CONSISTENCY: Regular.   ACTIVITY: As tolerated.   FOLLOWUP: Timeframe to follow up within 1-2 weeks with nephrology team and advised not to take ibuprofen, Advil, or any over-the-counter pain medication. Advised to speak to pharmacist first and no nonsteroidal anti-inflammatory drug due to kidney failure.   For history of presenting illness and hospital course up to May 15th, please see discharge summary done by Dr. Demetrios Loll on May 15th.   HOSPITAL COURSE AFTER MAY 15TH:   1. For SIRS with pneumonia, which was improving with her antibiotics and so it was changed to Augmentin oral.  Her IV steroidals were changed to oral prednisone tapering and patient had significant improvement. So, after finishing 12  days of antibiotic, we did not give any antibiotic at the time of discharge.  2. Acute renal failure, which was gradually getting better in hospital and urine output was good. Nephrology was following almost every day and so advised to follow with nephrology clinic within a week after discharge for complete restoration of kidney function.  3. Leukocytosis. Likely this was due to pneumonia.  4. Chronic pain syndrome.  As all the workup was done, but patient had complained of abdominal pain, and the CT scan was negative, this was chronic pain.  What she had seemed real, and she continued getting pain medication. She continued asking for IV dilaudid, but finally started on long-acting oxycodone and stopped dilaudid.  She was able to tolerate oral hydrocodone and control her pain, so discharged with that.  5. Hypertension. Likely it was due to pain related. Continued hydralazine and Lopressor and it was under control.   IMPORTANT LABORATORY RESULTS: On May 15th, creatinine was 4.47. WBC was 18.8 and hemoglobin was 11.3. Urinalysis was 1000 RBCs and 10 WBCs. On May 16, creatinine 3.89.  On May 17th, it was 3.25 and potassium was 3.3. On May 18th, on day of discharge, creatinine was 2.95, potassium level was 3.6.   TOTAL TIME SPENT ON THIS DISCHARGE: 40 minutes.  ____________________________ Ceasar Lund Anselm Jungling, MD vgv:dd D: 08/02/2013 23:47:00 ET T: 08/03/2013 03:18:02 ET JOB#: [id number]  cc: Ceasar Lund. Anselm Jungling, MD, <Dictator> Unknown cc Salome Holmes, MD  Vaughan Basta MD ELECTRONICALLY SIGNED 08/10/2013  16:32 
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
## 2014-07-09 NOTE — Op Note (Signed)
PATIENT NAME:  Rachael Jordan, [name] MR#:  [id number] DATE OF BIRTH:  10-Sep-1955  PULMONARY PROCEDURE NOTE  DATE OF PROCEDURE:  07/28/2013  PROCEDURE: A diagnostic fiberoptic bronchoscopy with bronchial washings.   INDICATION: Persistent pneumonia.  DETAILS OF PROCEDURE: After informed written consent was obtained from the patient and risks and benefits were explained to the patient, she was prepared in the usual manner in the bronchoscopy suite. The patient was given Versed 3 mg and 75 mcg of fentanyl. The patient tolerated the sedation well. Next, the fiberoptic scope was inserted through the right nostril down into the vocal cords. The vocal cords were anesthetized with 1% lidocaine. Then, the scope was passed through the cords down into the trachea. The trachea was visualized to be extremely erythematous, and it was very sensitive to the touch of the scope. Right lung was examined. Upper lobe and lower lobe all showed diffuse erythema, and the mucosa was friable. There were no masses noted. Some mucus plugging was noted, and the patient was suctioned out. The patient's left lung was then evaluated. Upper lobe, lower lobe and lingula were evaluated, and they were also found to be extremely erythematous. The mucosa was very friable to the touch, and the secretions were again cleared out. Two specimen jars were collected, and each will be sent for diagnostic studies. We will send the specimens for Gram stain, C and S, Legionella, Mycoplasma, AFB and fungus and also will send for viral cultures. The patient tolerated the procedure well. She will be recovered in the standard postoperative period. Will follow up with results once these are available.      ____________________________ Allyne Gee, MD sak:lb D: 07/28/2013 11:03:19 ET T: 07/28/2013 11:30:28 ET JOB#: [id number]  cc: Allyne Gee, MD, <Dictator> Allyne Gee MD ELECTRONICALLY SIGNED 08/03/2013 21:30

## 2014-07-09 NOTE — Consult Note (Signed)
Chief Complaint:  Subjective/Chief Complaint she states that she is slowly improving. Still has a cough and some sputum production   VITAL SIGNS/ANCILLARY NOTES: **Vital Signs.:   11-May-15 05:00  Vital Signs Type Routine  Temperature Temperature (F) 97.7  Celsius 36.5  Temperature Source oral  Pulse Pulse 82  Respirations Respirations 18  Systolic BP Systolic BP 643  Diastolic BP (mmHg) Diastolic BP (mmHg) 329  Mean BP 122  Pulse Ox % Pulse Ox % 94  Pulse Ox Activity Level  At rest  Oxygen Delivery 2L  *Intake and Output.:   Shift 11-May-15 15:00  Grand Totals Intake:  240 Output:      Net:  240 14 Hr.:  240  Oral Intake      In:  240  Length of Stay Totals Intake:  8829 Output:  7200    Net:  5188   Brief Assessment:  GEN well nourished, no acute distress   Cardiac Regular  no murmur  --Gallop   Respiratory normal resp effort  wheezing  rhonchi   Gastrointestinal details normal Soft  Nontender   EXTR negative cyanosis/clubbing, negative edema   Lab Results: Routine Chem:  11-May-15 06:48   Creatinine (comp) 0.79  eGFR (African American) >60  eGFR (Non-African American) >60 (eGFR values <66m/min/1.73 m2 may be an indication of chronic kidney disease (CKD). Calculated eGFR is useful in patients with stable renal function. The eGFR calculation will not be reliable in acutely ill patients when serum creatinine is changing rapidly. It is not useful in  patients on dialysis. The eGFR calculation may not be applicable to patients at the low and high extremes of body sizes, pregnant women, and vegetarians.)  Routine Hem:  11-May-15 06:48   WBC (CBC)  20.3  RBC (CBC) 4.06  Hemoglobin (CBC) 12.1  Hematocrit (CBC) 36.0  Platelet Count (CBC) 381  MCV 89  MCH 29.8  MCHC 33.6  RDW 13.0  Neutrophil % 86.8  Lymphocyte % 7.3  Monocyte % 5.6  Eosinophil % 0.0  Basophil % 0.3  Neutrophil #  17.7  Lymphocyte # 1.5  Monocyte #  1.1  Eosinophil # 0.0  Basophil  # 0.1 (Result(s) reported on 26 Jul 2013 at 07:58AM.)   Assessment/Plan:  Assessment/Plan:  Assessment 1. Pneumonia -multilobar involvement -slowly improving -will need to ontinue with present abx -if there is a decline in status would consdier bronch -repeat cxr   Electronic Signatures: KAllyne Gee(MD)  (Signed 11-May-15 14:00)  Authored: Chief Complaint, VITAL SIGNS/ANCILLARY NOTES, Brief Assessment, Lab Results, Assessment/Plan   Last Updated: 11-May-15 14:00 by KAllyne Gee(MD)

## 2014-07-09 NOTE — Discharge Summary (Signed)
PATIENT NAME:  Rachael Jordan, Rachael Jordan MR#:  [id number] DATE OF BIRTH:  1955/12/11  DATE OF ADMISSION:  07/22/2013  INTERIM DISCHARGE SUMMARY  PRIMARY CARE PHYSICIAN: Dr. Iona Beard.  REASON FOR ADMISSION: Cough, sputum, and shortness of breath.   HOSPITAL COURSE: The patient is a 59 year old African American female with a history of pneumonia 2 years ago who came to the ED due to cough, sputum, and shortness of breath. The patient underwent a CT scan of the chest, which revealed a multifocal pneumonia. The patient was on Levaquin for 4 days without improvement. The patient's oxygen saturation was 93 on room air in the ED. She was tachycardic at 134. For a detailed history and physical examination, please refer to the admission note dictated by Dr. Ether Griffins.   LABORATORY DATA: On admission date showed WBC 15.6, hemoglobin 11. BMP was in normal range. CAT scan of chest showed no evidence of acute pulmonary emboli and new bibasilar airspace opacity.  PROBLEMS: 1. SIRS with pneumonia. After admission, the patient was treated with vancomycin, Zosyn, and Levaquin. However, the patient continued to have a cough, shortness of breath so pulmonary Dr. Humphrey Rolls evaluated the patient, did a bronchoscopy which shows a mucous plus. The mucous plug was removed. After bronchoscopy, the patient's shortness of breath and cough has been improving. After  bronchoscopy the patient's symptoms have improved. The patient is off oxygen by nasal cannula. The patient's lung sounds are clear today, but still has a white count at 18, which is possibly due to pneumonia and the steroid. The patient was treated with IV Solu-Medrol and nebulizer then changed to p.o. prednisone 2 days ago. Since patient developed acute renal failure, vancomycin was discontinued.  2. Acute renal failure, which is possibly due to a combination of vancomycin, ibuprofen, dehydration. The patient's creatinine was normal but increased to 4 two days ago and  increased to 4.8 yesterday, decreased to 4.4 today. The patient got IV fluid support. Vancomycin and Ibuprofen were discontinued. The patient's kidney ultrasound did not show any hydronephrosis. Dr. Holley Raring evaluated the patient, suggested IV fluid support, avoid nephrotoxin, follow up BMP.  3. Hypokalemia. The patient's potassium was 2.6 yesterday was treated with potassium supplement. The patient's potassium is still low at 2.9 today. We will give potassium both p.o. and IV.  4. Chronic pain syndrome. The patient continuously had back pain and also abdominal pain. We gave morphine and Percocet. The patient said it does not work so I requested a pain management consult, per pain management physician, the patient can follow up with him as outpatient. No further recommendation at this time.  5. Patient got an abdomen and pelvis CAT scan, which did not show any acute abnormality. Since the patient continuously complains of severe back pain and abdominal pain, I changed to Dilaudid IV p.r.n.  6. Hypertension which is possibly due to body pain. The patient has been treated with IV hydralazine p.r.n. and Lopressor b.i.d.  7. Discussed with patient and the patient's family member every day. Answered all questions.  CURRENT DIAGNOSES: 1. Systemic inflammatory response syndrome with pneumonia. 2. Acute renal failure. 3. Leukocytosis. 4. Chronic pain syndrome. 5. Hypertension.   ____________________________ Demetrios Loll, MD qc:lt D: 07/30/2013 13:10:10 ET T: 07/30/2013 21:04:28 ET JOB#: [id number]  cc: Demetrios Loll, MD, <Dictator> Demetrios Loll MD ELECTRONICALLY SIGNED 08/07/2013 13:19
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
## 2014-07-20 ENCOUNTER — Other Ambulatory Visit: Payer: Self-pay | Admitting: Pain Medicine

## 2014-07-20 DIAGNOSIS — M5416 Radiculopathy, lumbar region: Secondary | ICD-10-CM

## 2014-07-27 ENCOUNTER — Other Ambulatory Visit: Payer: Self-pay | Admitting: Pain Medicine

## 2014-07-27 ENCOUNTER — Ambulatory Visit
Admission: RE | Admit: 2014-07-27 | Discharge: 2014-07-27 | Disposition: A | Discharge status: Home / Self Care | Payer: PRIVATE HEALTH INSURANCE | Source: Ambulatory Visit | Attending: Pain Medicine | Admitting: Pain Medicine

## 2014-07-27 DIAGNOSIS — R531 Weakness: Secondary | ICD-10-CM | POA: Insufficient documentation

## 2014-07-27 DIAGNOSIS — M5416 Radiculopathy, lumbar region: Secondary | ICD-10-CM

## 2014-07-27 DIAGNOSIS — M5136 Other intervertebral disc degeneration, lumbar region: Secondary | ICD-10-CM

## 2014-07-27 DIAGNOSIS — M5126 Other intervertebral disc displacement, lumbar region: Secondary | ICD-10-CM | POA: Insufficient documentation

## 2014-07-27 DIAGNOSIS — M545 Low back pain: Secondary | ICD-10-CM | POA: Diagnosis present

## 2014-07-27 DIAGNOSIS — M47816 Spondylosis without myelopathy or radiculopathy, lumbar region: Secondary | ICD-10-CM

## 2014-08-01 ENCOUNTER — Telehealth: Payer: Self-pay | Admitting: *Deleted

## 2014-08-01 NOTE — Progress Notes (Signed)
Does not take ASA or other blood thinners. Clearance request sent to Dr. Iona Beard. Pt. Agrees to come for epidural on May 23, pre-procedure instructions given. Does not wish to schedule neurosurgeon until she sees Dr. Primus Bravo.

## 2014-08-01 NOTE — Telephone Encounter (Signed)
-----   Message from Mohammed Kindle, MD sent at 07/27/2014  5:58 PM EDT ----- Please schedule patient for neurosurgical evaluation and have patient take lumbar MRI report and discs to the evaluation. Schedule patient for lumbar epidural steroid injection May 23 or 08/10/2014 . Have primary care physician clear patient for lumbar epidural steroid injection and have the nurses inquire about patient taking aspirin or other anticoagulant medications and discusse with me

## 2014-08-03 ENCOUNTER — Telehealth: Payer: Self-pay | Admitting: Pain Medicine

## 2014-08-03 NOTE — Telephone Encounter (Signed)
[place] needs note requesting permission from dr Iona Beard to have a procedure /  Fax to Dr. Cleon Dew office  [phone]

## 2014-08-04 ENCOUNTER — Telehealth: Payer: Self-pay | Admitting: Pain Medicine

## 2014-08-04 NOTE — Telephone Encounter (Signed)
Dr Primus Bravo when do you want  Rachael Jordan scheduled and what procedure ?

## 2014-08-04 NOTE — Telephone Encounter (Signed)
Rachael Jordan  Schedule lumbar facet, medial branch nerve, blocks for June 1 please  His please call Rachael Jordan to see if patient is taking aspirin or other similar medications and stopped aspirin for 5 days and discuss other medications with me if necessary

## 2014-08-10 NOTE — Progress Notes (Signed)
Patient already has appt. For next Wednesday.  She does not take any blood thinners.

## 2014-08-15 ENCOUNTER — Other Ambulatory Visit: Payer: Self-pay | Admitting: Pain Medicine

## 2014-08-15 DIAGNOSIS — G8929 Other chronic pain: Secondary | ICD-10-CM | POA: Insufficient documentation

## 2014-08-15 DIAGNOSIS — G43909 Migraine, unspecified, not intractable, without status migrainosus: Secondary | ICD-10-CM | POA: Insufficient documentation

## 2014-08-15 DIAGNOSIS — M48062 Spinal stenosis, lumbar region with neurogenic claudication: Secondary | ICD-10-CM | POA: Insufficient documentation

## 2014-08-15 DIAGNOSIS — G43109 Migraine with aura, not intractable, without status migrainosus: Secondary | ICD-10-CM

## 2014-08-15 DIAGNOSIS — M5481 Occipital neuralgia: Secondary | ICD-10-CM

## 2014-08-15 DIAGNOSIS — M5030 Other cervical disc degeneration, unspecified cervical region: Secondary | ICD-10-CM

## 2014-08-15 DIAGNOSIS — M533 Sacrococcygeal disorders, not elsewhere classified: Secondary | ICD-10-CM | POA: Insufficient documentation

## 2014-08-15 DIAGNOSIS — M5136 Other intervertebral disc degeneration, lumbar region: Secondary | ICD-10-CM | POA: Insufficient documentation

## 2014-08-15 DIAGNOSIS — M5416 Radiculopathy, lumbar region: Secondary | ICD-10-CM | POA: Insufficient documentation

## 2014-08-17 ENCOUNTER — Encounter: Payer: Self-pay | Admitting: Pain Medicine

## 2014-08-17 ENCOUNTER — Ambulatory Visit: Payer: PRIVATE HEALTH INSURANCE | Attending: Pain Medicine | Admitting: Pain Medicine

## 2014-08-17 VITALS — BP 121/76 | HR 77 | Temp 98.0°F | Resp 14 | Ht 65.0 in | Wt 160.0 lb

## 2014-08-17 DIAGNOSIS — M533 Sacrococcygeal disorders, not elsewhere classified: Secondary | ICD-10-CM

## 2014-08-17 DIAGNOSIS — M48062 Spinal stenosis, lumbar region with neurogenic claudication: Secondary | ICD-10-CM

## 2014-08-17 DIAGNOSIS — M5126 Other intervertebral disc displacement, lumbar region: Secondary | ICD-10-CM | POA: Diagnosis not present

## 2014-08-17 DIAGNOSIS — M5416 Radiculopathy, lumbar region: Secondary | ICD-10-CM

## 2014-08-17 DIAGNOSIS — M5030 Other cervical disc degeneration, unspecified cervical region: Secondary | ICD-10-CM

## 2014-08-17 DIAGNOSIS — M79605 Pain in left leg: Secondary | ICD-10-CM | POA: Diagnosis present

## 2014-08-17 DIAGNOSIS — G43109 Migraine with aura, not intractable, without status migrainosus: Secondary | ICD-10-CM

## 2014-08-17 DIAGNOSIS — M79604 Pain in right leg: Secondary | ICD-10-CM | POA: Diagnosis present

## 2014-08-17 DIAGNOSIS — M545 Low back pain: Secondary | ICD-10-CM | POA: Diagnosis present

## 2014-08-17 DIAGNOSIS — M5136 Other intervertebral disc degeneration, lumbar region: Secondary | ICD-10-CM

## 2014-08-17 DIAGNOSIS — M5481 Occipital neuralgia: Secondary | ICD-10-CM

## 2014-08-17 MED ORDER — CEFUROXIME AXETIL 250 MG PO TABS: 250.0000 mg | ORAL_TABLET | Freq: Two times a day (BID) | ORAL | Status: DC

## 2014-08-17 MED ORDER — FENTANYL CITRATE (PF) 100 MCG/2ML IJ SOLN
INTRAMUSCULAR | Status: AC
  Administered 2014-08-17: 100 ug via INTRAVENOUS
  Filled 2014-08-17: qty 2

## 2014-08-17 MED ORDER — TRIAMCINOLONE ACETONIDE 40 MG/ML IJ SUSP
INTRAMUSCULAR | Status: AC
  Administered 2014-08-17: 12:00:00
  Filled 2014-08-17: qty 1

## 2014-08-17 MED ORDER — CEFAZOLIN SODIUM 1 G IJ SOLR
INTRAMUSCULAR | Status: AC
  Administered 2014-08-17: 1 g via INTRAVENOUS
  Filled 2014-08-17: qty 10

## 2014-08-17 MED ORDER — LIDOCAINE HCL (PF) 1 % IJ SOLN
INTRAMUSCULAR | Status: AC
  Administered 2014-08-17: 12:00:00
  Filled 2014-08-17: qty 5

## 2014-08-17 MED ORDER — MIDAZOLAM HCL 5 MG/5ML IJ SOLN
INTRAMUSCULAR | Status: AC
  Administered 2014-08-17: 4 mg via INTRAVENOUS
  Filled 2014-08-17: qty 5

## 2014-08-17 MED ORDER — SODIUM CHLORIDE 0.9 % IJ SOLN
INTRAMUSCULAR | Status: AC
  Administered 2014-08-17: 12:00:00
  Filled 2014-08-17: qty 20

## 2014-08-17 MED ORDER — ORPHENADRINE CITRATE 30 MG/ML IJ SOLN
INTRAMUSCULAR | Status: AC
  Administered 2014-08-17: 12:00:00
  Filled 2014-08-17: qty 2

## 2014-08-17 MED ORDER — BUPIVACAINE HCL (PF) 0.25 % IJ SOLN
INTRAMUSCULAR | Status: AC
  Administered 2014-08-17: 12:00:00
  Filled 2014-08-17: qty 30

## 2014-08-17 NOTE — Patient Instructions (Addendum)
Continue present medications and antibiotics  F/U PCP for evaliation of  BP and general medical  condition.  F/U surgical evaluation. As discussed would like for you to have neurosurgical evaluation to discuss the findings on your recent MRI and to discuss treatment of your condition  F/U neurological evaluation.  May consider radiofrequency rhizolysis or intraspinal procedures pending response to present treatment and F/U evaluation.  Patient to call Pain Management Center should patient have concerns prior to scheduled return appointment.   Pain Management Discharge Instructions  General Discharge Instructions :  If you need to reach your doctor call: Monday-Friday 8:00 am - 4:00 pm at [phone] or toll free [phone].  After clinic hours [phone] to have operator reach doctor.  Bring all of your medication bottles to all your appointments in the pain clinic.  To cancel or reschedule your appointment with Pain Management please remember to call 24 hours in advance to avoid a fee.  Refer to the educational materials which you have been given on: General Risks, I had my Procedure. Discharge Instructions, Post Sedation.  Post Procedure Instructions:  The drugs you were given will stay in your system until tomorrow, so for the next 24 hours you should not drive, make any legal decisions or drink any alcoholic beverages.  You may eat anything you prefer, but it is better to start with liquids then soups and crackers, and gradually work up to solid foods.  Please notify your doctor immediately if you have any unusual bleeding, trouble breathing or pain that is not related to your normal pain.  Depending on the type of procedure that was done, some parts of your body may feel week and/or numb.  This usually clears up by tonight or the next day.  Walk with the use of an assistive device or accompanied by an adult for the 24 hours.  You may use ice on the affected area for  the first 24 hours.  Put ice in a Ziploc bag and cover with a towel and place against area 15 minutes on 15 minutes off.  You may switch to heat after 24 hours.  A prescription for CEFTIN was sent to your pharmacy and should be available for pickup today.

## 2014-08-17 NOTE — Progress Notes (Signed)
   Subjective:    [name], female    DOB: [birth year], 57 y.o.   MRN: [id number]  HPI   PROCEDURE PERFORMED: Lumbar epidural steroid injection   NOTE: The patient is a 59 y.o. female who returns to [place] for further evaluation and treatment of pain involving the lumbar and lower extremity region. MRI revealed the patient to be with shallow rightward disc protrusion and mild right subarticular narrowing. Mild facet hypertrophy at L5-S1 without significant foraminal stenosis. The risks, benefits, and expectations of the procedure have been discussed and explained to the patient who was understanding and in agreement with suggested treatment plan. We will proceed with interventional treatment as discussed and explained to the patient who is willing to proceed with procedure as planned.   DESCRIPTION OF PROCEDURE: Lumbar epidural steroid injection with IV Versed, IV fentanyl conscious sedation, EKG, blood pressure, pulse, and pulse oximetry monitoring. The procedure was performed with the patient in the prone position under fluoroscopic guidance. A local anesthetic skin wheal of 1.5% plain lidocaine was accomplished at proposed entry site. An 18-gauge Tuohy epidural needle was inserted at the L 4 vertebral body level right of the midline via loss-of-resistance technique with negative heme and negative CSF return. A total of 4 mL of Preservative-Free normal saline with 40 mg of Kenalog injected incrementally via epidurally placed needle. Needle removed. The patient tolerated the injection well.   PLAN:   1. Medications: We will continue presently prescribed medications. 2. Will consider modification of treatment regimen pending response to treatment rendered on today's visit and follow-up evaluation. 3. The patient is to follow-up with primary care physician regarding blood pressure and general medical condition status post lumbar epidural steroid injection  performed on today's visit. 4. Surgical evaluation. 5. Neurological evaluation. 6. The patient may be a candidate for radiofrequency procedures, implantation device, and other treatment pending response to treatment and follow-up evaluation. 7. The patient has been advised to adhere to proper body mechanics and avoid activities which appear to aggravate condition. 8. The patient has been advised to call the Pain Management Center prior to scheduled return appointment should there be significant change in condition or should there be significant  1. Medications: We will continue presently prescribed medications.  2. Will consider modification of treatment regimen pending response to treatment rendered on today's visit and follow-up evaluation.  3. The patient is to follow-up with primary care physician regarding blood pressure and general medical condition status post lumbar epidural steroid injection performed on today's visit.  4. Surgical evaluation.  5. Neurological evaluation. 6. The patient may be a candidate for radiofrequency procedures, implantation device, and other treatment pending response to treatment and follow-up evaluation.  7. The patient has been advised to adhere to proper body mechanics and avoid activities which appear to aggravate condition.  8. The patient has been advised to call the Pain Management Center prior to scheduled return appointment should there be significant change in condition or should should patient have other concerns regarding condition prior to scheduled return appointment.  The patient is understanding and in agreement with suggested treatment plan.   Review of Systems     Objective:   Physical Exam        Assessment & Plan:

## 2014-08-17 NOTE — Progress Notes (Signed)
Safety precautions to be maintained throughout the outpatient stay will include: orient to surroundings, keep bed in low position, maintain call bell within reach at all times, provide assistance with transfer out of bed and ambulation.  

## 2014-08-17 NOTE — Progress Notes (Signed)
   Subjective:    [name], female    DOB: 1955/11/23, 59 y.o.   MRN: [id number]  HPI    Review of Systems     Objective:   Physical Exam        Assessment & Plan:

## 2014-08-18 ENCOUNTER — Telehealth: Payer: Self-pay | Admitting: *Deleted

## 2014-08-18 NOTE — Telephone Encounter (Signed)
No problems 

## 2014-08-29 ENCOUNTER — Telehealth: Payer: Self-pay | Admitting: Pain Medicine

## 2014-08-29 NOTE — Telephone Encounter (Signed)
Would like to talk to Dr Primus Bravo reaction she had to procedure. Would like to talk to talk to someone

## 2014-08-30 NOTE — Telephone Encounter (Signed)
Patient states that she went to PCP on Friday June 10th for some spasming that she was having in her neck. States that she got a shot in her hip for the pain. States no complications from the LESI and that she is getting pain relief from the procedure. Wanted Dr. Primus Bravo to know.

## 2014-08-30 NOTE — Telephone Encounter (Signed)
Nurses  Please thank patient for providing me with this information also informed me of patient's return appointment date and let me know if patient has any other issues to address

## 2014-08-30 NOTE — Telephone Encounter (Signed)
Patient's next appointment is 09-13-14.

## 2014-09-13 ENCOUNTER — Ambulatory Visit: Payer: PRIVATE HEALTH INSURANCE | Attending: Pain Medicine | Admitting: Pain Medicine

## 2014-09-13 ENCOUNTER — Encounter: Payer: Self-pay | Admitting: Pain Medicine

## 2014-09-13 VITALS — BP 114/65 | HR 85 | Temp 97.4°F | Resp 16 | Ht 65.0 in | Wt 160.0 lb

## 2014-09-13 DIAGNOSIS — M533 Sacrococcygeal disorders, not elsewhere classified: Secondary | ICD-10-CM | POA: Diagnosis not present

## 2014-09-13 DIAGNOSIS — M5481 Occipital neuralgia: Secondary | ICD-10-CM | POA: Diagnosis not present

## 2014-09-13 DIAGNOSIS — M79604 Pain in right leg: Secondary | ICD-10-CM | POA: Diagnosis present

## 2014-09-13 DIAGNOSIS — M5136 Other intervertebral disc degeneration, lumbar region: Secondary | ICD-10-CM | POA: Diagnosis not present

## 2014-09-13 DIAGNOSIS — M5126 Other intervertebral disc displacement, lumbar region: Secondary | ICD-10-CM | POA: Diagnosis not present

## 2014-09-13 DIAGNOSIS — M79605 Pain in left leg: Secondary | ICD-10-CM | POA: Diagnosis present

## 2014-09-13 DIAGNOSIS — M545 Low back pain: Secondary | ICD-10-CM | POA: Diagnosis present

## 2014-09-13 DIAGNOSIS — M5030 Other cervical disc degeneration, unspecified cervical region: Secondary | ICD-10-CM

## 2014-09-13 DIAGNOSIS — M48062 Spinal stenosis, lumbar region with neurogenic claudication: Secondary | ICD-10-CM

## 2014-09-13 DIAGNOSIS — M5416 Radiculopathy, lumbar region: Secondary | ICD-10-CM

## 2014-09-13 MED ORDER — HYDROCODONE-ACETAMINOPHEN 7.5-325 MG PO TABS: ORAL_TABLET | ORAL | Status: DC

## 2014-09-13 NOTE — Progress Notes (Signed)
   Subjective:    [name], female    DOB: 1955-10-05, 59 y.o.   MRN: [id number]  HPI  Patient 59 year old female returns to [place] for further evaluation and treatment of pain involving the lower back and lower extremity regions. Patient is with pain of lesser degree of the cervical region. Patient states that her pain of the lower back lower extremity regions aggravated by standing walking twisting turning maneuvers. Patient denies any trauma change in events of daily living the call significant changes and pathology. Patient's condition on today's visit and will consider patient for interventional treatment consisting of lumbar facet, medial branch nerve, blocks at time return appointment in attempt to decrease severity of symptoms and hopefully avoid progression of patient's symptoms. The patient was understanding and agrees suggested treatment plan.    Review of Systems     Objective:   Physical Exam There was tends to palpation of the spinous Tenderness of the talus musculature region of mild to moderate degree. This appeared to be with unremarkable Spurling's maneuver. Palpation of the acromioclavicular and glenohumeral joint region was with minimal tenderness to palpation. Patient appeared to be with slightly decreased grip strength. Tinel and Phalen's maneuver without increased pain of mild degree. Palpation over the thoracic facet thoracic paraspinal muscles region was with mild tends to palpation of upper thoracic region and moderate tends to palpation in the lower thoracic region with no crepitus of the thoracic region noted. Palpation over the lumbar paraspinal muscles region lumbar facet region associated with moderate moderately severe discomfort with significant muscle spasms lower thoracic region and severe tenderness to palpation over the lumbar facet lumbar paraspinal musculature region. No definite sensory deficit of dermatomal disc disease  was detected. There was negative clonus negative Homans. Mild tinnitus of the greater trochanteric region iliotibial band region. DTRs difficult to elicit patient had difficulty relaxing. Negative clonus negative Homans. Abdomen nontender and no costovertebral angle tenderness noted.          Assessment & Plan:  Degenerative disc disease lumbar spine Shallow rightward disc protrusion with mild right subarticular narrowing. Mild facet hypertrophy L5-S1 without significant foraminal stenosis  Lumbar facet syndrome  Sacroiliac joint dysfunction  Bilateral occipital neuralgia     Plan    Continue present medications hydrocodone acetaminophen as prescribed. We will assume prescribing hydrocodone acetaminophen as discussed was the obtain permission from your prescribing physician to prescribe this medication for you  Lumbar facet, medial branch nerve, blocks to be performed at time return appointment  F/U PCP for evaliation of  BP and general medical  condition.  F/U surgical evaluation  F/U neurological evaluation  May consider radiofrequency rhizolysis or intraspinal procedures pending response to present treatment and F/U evaluation.  Patient to call Pain Management Center should patient have concerns prior to scheduled return appointment.

## 2014-09-13 NOTE — Progress Notes (Signed)
Safety precautions to be maintained throughout the outpatient stay will include: orient to surroundings, keep bed in low position, maintain call bell within reach at all times, provide assistance with transfer out of bed and ambulation.  

## 2014-09-13 NOTE — Patient Instructions (Addendum)
Continue present medications You will receive hydrocodone acetaminophen prescription today  Lumbar facet, medial branch nerve, blocks to be performed 09/26/2014  F/U PCP for evaliation of  BP and general medical  condition.  F/U surgical evaluation.  F/U neurological evaluation.  May consider radiofrequency rhizolysis or intraspinal procedures pending response to present treatment and F/U evaluation.  Patient to call Pain Management Center should patient have concerns prior to scheduled return appointment.

## 2014-09-26 ENCOUNTER — Ambulatory Visit: Payer: PRIVATE HEALTH INSURANCE | Admitting: Pain Medicine

## 2014-09-27 NOTE — Telephone Encounter (Signed)
Patient was called and left message in reference to a  prescription.

## 2014-10-03 ENCOUNTER — Ambulatory Visit: Payer: PRIVATE HEALTH INSURANCE | Attending: Pain Medicine | Admitting: Pain Medicine

## 2014-10-03 VITALS — BP 121/62 | HR 78 | Temp 98.4°F | Resp 16 | Ht 65.0 in | Wt 164.0 lb

## 2014-10-03 DIAGNOSIS — M533 Sacrococcygeal disorders, not elsewhere classified: Secondary | ICD-10-CM

## 2014-10-03 DIAGNOSIS — M5416 Radiculopathy, lumbar region: Secondary | ICD-10-CM

## 2014-10-03 DIAGNOSIS — M5126 Other intervertebral disc displacement, lumbar region: Secondary | ICD-10-CM | POA: Insufficient documentation

## 2014-10-03 DIAGNOSIS — M47816 Spondylosis without myelopathy or radiculopathy, lumbar region: Secondary | ICD-10-CM | POA: Insufficient documentation

## 2014-10-03 DIAGNOSIS — M5030 Other cervical disc degeneration, unspecified cervical region: Secondary | ICD-10-CM

## 2014-10-03 DIAGNOSIS — M5481 Occipital neuralgia: Secondary | ICD-10-CM

## 2014-10-03 DIAGNOSIS — M79604 Pain in right leg: Secondary | ICD-10-CM | POA: Diagnosis present

## 2014-10-03 DIAGNOSIS — G43101 Migraine with aura, not intractable, with status migrainosus: Secondary | ICD-10-CM

## 2014-10-03 DIAGNOSIS — M545 Low back pain: Secondary | ICD-10-CM | POA: Diagnosis present

## 2014-10-03 DIAGNOSIS — M79605 Pain in left leg: Secondary | ICD-10-CM | POA: Diagnosis present

## 2014-10-03 DIAGNOSIS — M5136 Other intervertebral disc degeneration, lumbar region: Secondary | ICD-10-CM

## 2014-10-03 DIAGNOSIS — M48062 Spinal stenosis, lumbar region with neurogenic claudication: Secondary | ICD-10-CM

## 2014-10-03 MED ORDER — BUPIVACAINE HCL (PF) 0.25 % IJ SOLN
INTRAMUSCULAR | Status: AC
  Administered 2014-10-03: 12:00:00
  Filled 2014-10-03: qty 30

## 2014-10-03 MED ORDER — TRIAMCINOLONE ACETONIDE 40 MG/ML IJ SUSP
INTRAMUSCULAR | Status: AC
  Administered 2014-10-03: 12:00:00
  Filled 2014-10-03: qty 1

## 2014-10-03 MED ORDER — ORPHENADRINE CITRATE 30 MG/ML IJ SOLN
INTRAMUSCULAR | Status: AC
  Filled 2014-10-03: qty 2

## 2014-10-03 MED ORDER — FENTANYL CITRATE (PF) 100 MCG/2ML IJ SOLN
INTRAMUSCULAR | Status: AC
  Administered 2014-10-03: 100 ug
  Filled 2014-10-03: qty 2

## 2014-10-03 MED ORDER — MIDAZOLAM HCL 5 MG/5ML IJ SOLN
INTRAMUSCULAR | Status: AC
  Administered 2014-10-03: 5 mg via INTRAVENOUS
  Filled 2014-10-03: qty 5

## 2014-10-03 NOTE — Progress Notes (Signed)
Subjective:    [name], female    DOB: June 17, 1955, 59 y.o.   MRN: [id number]  HPI  PROCEDURE PERFORMED: Lumbar facet (medial branch block)   NOTE: The patient is a 59 y.o. female who returns to [place] for further evaluation and treatment of pain involving the lumbar and lower extremity region. MRI  revealed the patient to be with evidence of multilevel degenerative changes with shallow rightward disc protrusion and mild right subarticular narrowing with facet hypertrophy at L5-S1 of significant degree. The risks, benefits, and expectations of the procedure have been discussed and explained to the patient who was understanding and in agreement with suggested treatment plan. We will proceed with interventional treatment as discussed and as explained to the patient who was understanding and wished to proceed with procedure as planned.   DESCRIPTION OF PROCEDURE: Lumbar facet (medial branch block) with IV Versed, IV fentanyl conscious sedation, EKG, blood pressure, pulse, and pulse oximetry monitoring. The procedure was performed with the patient in the prone position. Betadine prep of proposed entry site performed.   NEEDLE PLACEMENT AT: Left L 3 lumbar facet (medial branch block). Under fluoroscopic guidance with oblique orientation of 15 degrees, a 22-gauge needle was inserted at the L 3 vertebral body level with needle placed at the targeted area of Burton's Eye or Eye of the Scotty Dog with documentation of needle placement in the superior and lateral border of targeted area of Burton's Eye or Eye of the Scotty Dog with oblique orientation of 15 degrees. Following documentation of needle placement at the L 3 vertebral body level, needle placement was then accomplished at the L 4 vertebral body level.   NEEDLE PLACEMENT AT L4 and L5 VERTEBRAL BODY LEVELS ON THE LEFT SIDE The procedure was performed at the L4 and L5 vertebral body levels exactly as was  performed at the L 3 vertebral body level utilizing the same technique and under fluoroscopic guidance.  NEEDLE PLACEMENT AT THE SACRAL ALA with AP view of the lumbosacral spine. With the patient in the prone position, Betadine prep of proposed entry site accomplished, a 22 gauge needle was inserted in the region of the sacral ala (groove formed by the superior articulating process of S1 and the sacral wing). Following documentation of needle placement at the sacral ala,  needle placement was then accomplished at the S1 foramen level.   NEEDLE PLACEMENT AT THE S1 FORAMEN LEVEL under fluoroscopic guidance with AP view of the lumbosacral spine and cephalad orientation of the fluoroscope, a 22-gauge needle was placed at the superior and lateral border of the S1 foramen under fluoroscopic guidance. Following documentation of needle placement at the S1 foramen.   Needle placement was then verified at all levels on lateral view. Following documentation of needle placement at all levels on lateral view and following negative aspiration for heme and CSF, each level was injected with 1 mL of 0.25% bupivacaine with Kenalog.     LUMBAR FACET, MEDIAL BRANCH NERVE, BLOCKS PERFORMED ON THE RIGHT SIDE   The procedure was performed on the right side exactly as was performed on the left side at the same levels and utilizing the same technique under fluoroscopic guidance.     The patient tolerated the procedure well. A total of 40 mg of Kenalog was utilized for the procedure.   PLAN:  1. Medications: The patient will continue presently prescribed medication hydrocodone acetaminophen 2. May consider modification of treatment regimen at time of return  appointment pending response to treatment rendered on today's visit. 3. The patient is to follow-up with primary care physician Dr. Iona Beard for further evaluation of blood pressure and general medical condition status post steroid injection performed on today's  visit. 4. Surgical follow-up evaluation. 5. Neurological follow-up evaluation. 6. The patient may be candidate for radiofrequency procedures, implantation type procedures, and other treatment pending response to treatment and follow-up evaluation. 7. The patient has been advised to call the Pain Management Center prior to scheduled return appointment should there be significant change in condition or should patient have other concerns regarding condition prior to scheduled return appointment.  The patient is understanding and in agreement with suggested treatment plan.      Review of Systems     Objective:   Physical Exam        Assessment & Plan:

## 2014-10-03 NOTE — Patient Instructions (Addendum)
Continue present medications  F/U PCP Dr  Iona Beard  for evaliation of  BP and general medical  condition.  F/U surgical evaluation  F/U neurological evaluation  May consider radiofrequency rhizolysis or intraspinal procedures pending response to present treatment and F/U evaluation.  Patient to call [facility] should patient have concerns prior to scheduled return appointment.  Pain Management Discharge Instructions  General Discharge Instructions :  If you need to reach your doctor call: Monday-Friday 8:00 am - 4:00 pm at [phone] or toll free [phone].  After clinic hours [phone] to have operator reach doctor.  Bring all of your medication bottles to all your appointments in the pain clinic.  To cancel or reschedule your appointment with Pain Management please remember to call 24 hours in advance to avoid a fee.  Refer to the educational materials which you have been given on: General Risks, I had my Procedure. Discharge Instructions, Post Sedation.  Post Procedure Instructions:  The drugs you were given will stay in your system until tomorrow, so for the next 24 hours you should not drive, make any legal decisions or drink any alcoholic beverages.  You may eat anything you prefer, but it is better to start with liquids then soups and crackers, and gradually work up to solid foods.  Please notify your doctor immediately if you have any unusual bleeding, trouble breathing or pain that is not related to your normal pain.  Depending on the type of procedure that was done, some parts of your body may feel week and/or numb.  This usually clears up by tonight or the next day.  Walk with the use of an assistive device or accompanied by an adult for the 24 hours.  You may use ice on the affected area for the first 24 hours.  Put ice in a Ziploc bag and cover with a towel and place against area 15 minutes on 15 minutes off.  You may switch to heat after 24  hours.Facet Joint Block The facet joints connect the bones of the spine (vertebrae). They make it possible for you to bend, twist, and make other movements with your spine. They also prevent you from overbending, overtwisting, and making other excessive movements.  A facet joint block is a procedure where a numbing medicine (anesthetic) is injected into a facet joint. Often, a type of anti-inflammatory medicine called a steroid is also injected. A facet joint block may be done for two reasons:   Diagnosis. A facet joint block may be done as a test to see whether neck or back pain is caused by a worn-down or infected facet joint. If the pain gets better after a facet joint block, it means the pain is probably coming from the facet joint. If the pain does not get better, it means the pain is probably not coming from the facet joint.   Therapy. A facet joint block may be done to relieve neck or back pain caused by a facet joint. A facet joint block is only done as a therapy if the pain does not improve with medicine, exercise programs, physical therapy, and other forms of pain management. LET [facility] CARE PROVIDER KNOW ABOUT:   Any allergies you have.   All medicines you are taking, including vitamins, herbs, eyedrops, and over-the-counter medicines and creams.   Previous problems you or members of your family have had with the use of anesthetics.   Any blood disorders you have had.   Other health problems you  have. RISKS AND COMPLICATIONS Generally, having a facet joint block is safe. However, as with any procedure, complications can occur. Possible complications associated with having a facet joint block include:   Bleeding.   Injury to a nerve near the injection site.   Pain at the injection site.   Weakness or numbness in areas controlled by nerves near the injection site.   Infection.   Temporary fluid retention.   Allergic reaction to anesthetics or medicines used  during the procedure. BEFORE THE PROCEDURE   Follow your health care provider's instructions if you are taking dietary supplements or medicines. You may need to stop taking them or reduce your dosage.   Do not take any new dietary supplements or medicines without asking your health care provider first.   Follow your health care provider's instructions about eating and drinking before the procedure. You may need to stop eating and drinking several hours before the procedure.   Arrange to have an adult drive you home after the procedure. PROCEDURE  You may need to remove your clothing and dress in an open-back gown so that your health care provider can access your spine.   The procedure will be done while you are lying on an X-ray table. Most of the time you will be asked to lie on your stomach, but you may be asked to lie in a different position if an injection will be made in your neck.   Special machines will be used to monitor your oxygen levels, heart rate, and blood pressure.   If an injection will be made in your neck, an intravenous (IV) tube will be inserted into one of your veins. Fluids and medicine will flow directly into your body through the IV tube.   The area over the facet joint where the injection will be made will be cleaned with an antiseptic soap. The surrounding skin will be covered with sterile drapes.   An anesthetic will be applied to your skin to make the injection area numb. You may feel a temporary stinging or burning sensation.   A video X-ray machine will be used to locate the joint. A contrast dye may be injected into the facet joint area to help with locating the joint.   When the joint is located, an anesthetic medicine will be injected into the joint through the needle.   Your health care provider will ask you whether you feel pain relief. If you do feel relief, a steroid may be injected to provide pain relief for a longer period of time. If you  do not feel relief or feel only partial relief, additional injections of an anesthetic may be made in other facet joints.   The needle will be removed, the skin will be cleansed, and bandages will be applied.  AFTER THE PROCEDURE   You will be observed for 15-30 minutes before being allowed to go home. Do not drive. Have an adult drive you or take a taxi or public transportation instead.   If you feel pain relief, the pain will return in several hours or days when the anesthetic wears off.   You may feel pain relief 2-14 days after the procedure. The amount of time this relief lasts varies from person to person.   It is normal to feel some tenderness over the injected area(s) for 2 days following the procedure.   If you have diabetes, you may have a temporary increase in blood sugar. Document Released: 07/24/2006 Document Revised: 07/19/2013 Document  Reviewed: 12/23/2011 Exit[employer] Patient Information 2015 [place], [place]. This information is not intended to replace advice given to you by your health care provider. Make sure you discuss any questions you have with your health care provider.

## 2014-10-03 NOTE — Progress Notes (Signed)
Safety precautions to be maintained throughout the outpatient stay will include: orient to surroundings, keep bed in low position, maintain call bell within reach at all times, provide assistance with transfer out of bed and ambulation.  

## 2014-10-04 ENCOUNTER — Telehealth: Payer: Self-pay | Admitting: *Deleted

## 2014-10-04 NOTE — Telephone Encounter (Signed)
Left msg

## 2014-10-13 ENCOUNTER — Encounter: Payer: PRIVATE HEALTH INSURANCE | Admitting: Pain Medicine

## 2014-10-26 ENCOUNTER — Ambulatory Visit: Payer: PRIVATE HEALTH INSURANCE | Attending: Pain Medicine | Admitting: Pain Medicine

## 2014-10-26 ENCOUNTER — Encounter: Payer: Self-pay | Admitting: Pain Medicine

## 2014-10-26 VITALS — BP 112/77 | HR 92 | Temp 98.3°F | Resp 14 | Ht 65.0 in | Wt 160.0 lb

## 2014-10-26 DIAGNOSIS — M5030 Other cervical disc degeneration, unspecified cervical region: Secondary | ICD-10-CM

## 2014-10-26 DIAGNOSIS — M5126 Other intervertebral disc displacement, lumbar region: Secondary | ICD-10-CM | POA: Diagnosis not present

## 2014-10-26 DIAGNOSIS — M5136 Other intervertebral disc degeneration, lumbar region: Secondary | ICD-10-CM | POA: Diagnosis not present

## 2014-10-26 DIAGNOSIS — M5416 Radiculopathy, lumbar region: Secondary | ICD-10-CM

## 2014-10-26 DIAGNOSIS — G43109 Migraine with aura, not intractable, without status migrainosus: Secondary | ICD-10-CM

## 2014-10-26 DIAGNOSIS — M546 Pain in thoracic spine: Secondary | ICD-10-CM | POA: Diagnosis present

## 2014-10-26 DIAGNOSIS — M533 Sacrococcygeal disorders, not elsewhere classified: Secondary | ICD-10-CM | POA: Diagnosis not present

## 2014-10-26 DIAGNOSIS — M5481 Occipital neuralgia: Secondary | ICD-10-CM | POA: Diagnosis not present

## 2014-10-26 DIAGNOSIS — M48062 Spinal stenosis, lumbar region with neurogenic claudication: Secondary | ICD-10-CM

## 2014-10-26 DIAGNOSIS — M542 Cervicalgia: Secondary | ICD-10-CM | POA: Diagnosis present

## 2014-10-26 NOTE — Progress Notes (Signed)
   Subjective:    [name], female    DOB: [birth year], 57 y.o.   MRN: [id number]  HPI  Patient is 59 year old female returns to [place] for further evaluation and treatment of pain involving the region of the neck and entire back upper and lower extremity regions. Patient is a significant pain occurring in the mid back region as well as the lower back region with significant muscle spasms. Patient with improvement with prior interventional treatment and [facility] wishes to proceed with lumbar facet, medial branch nerve, blocks at time return appointment in attempt to decrease the pain will significantly. We discussed patient's condition and will proceed with lumbar facet, medial branch nerve, blocks at time return appointment and will continue presently prescribed medications. We will also discussed further surgical evaluation and will consider such pending response to treatment and follow-up evaluation. The patient is in agreement with suggested treatment plan  Review of Systems     Objective:   Physical Exam There was tenderness over the splenius capitis and occipitalis musculature region of mild degree. There was mild tenderness of the acromioclavicular glenohumeral joint region. There was unremarkable Spurling's maneuver. Palpation over the thoracic facet thoracic paraspinal musculature region reproduced moderate discomfort with moderate muscle spasms being noted. Palpation over the lower thoracic paraspinal musculature region was with moderately severe tenderness to palpation of muscle spasms. No crepitus of the thoracic region noted. Be with unremarkable Tinel and Phalen's maneuver. Palpation over the lumbar facets lumbar paraspinal musculature region reproduced severe disabling pain. Lateral bending and rotation and extension and palpation of the lumbar facets reproduce mid moderately severe discomfort. Straight leg raising limited to  approximately 20 without increased pain with dorsiflexion noted. With mild to moderate discomfort over the PSIS and PII S regions. There was negative clonus negative Homans. Abdomen was nontender with no costovertebral angle tenderness noted.       Assessment & Plan:   Degenerative disc disease lumbar spine Shallow rightward disc protrusion with mild right subarticular narrowing. Mild facet hypertrophy L5-S1 without significant foraminal stenosis  Lumbar facet syndrome  Sacroiliac joint dysfunction  Bilateral occipital neuralgia    Plan   Continue present medication hydrocodone acetaminophen  Lumbar facet, medial branch nerve, blocks to be performed at time return appointment as planned  F/U PCP Dr. Iona Beard for evaliation of  BP and general medical  condition  F/U surgical evaluation. We have discussed neurosurgical reevaluation and will consider as discussed  F/U neurological evaluation as discussed  May consider radiofrequency rhizolysis or intraspinal procedures pending response to present treatment and F/U evaluation   Patient to call Pain Management Center should patient have concerns prior to scheduled return appointmen.

## 2014-10-26 NOTE — Progress Notes (Signed)
Safety precautions to be maintained throughout the outpatient stay will include: orient to surroundings, keep bed in low position, maintain call bell within reach at all times, provide assistance with transfer out of bed and ambulation.  

## 2014-10-26 NOTE — Patient Instructions (Addendum)
Continue present medication hydrocodone acetaminophen  Lumbar facet, medial branch nerve, blocks to be performed at time return appointment as discussed  F/U PCP Dr. Iona Beard for evaliation of  BP and general medical  condition  F/U surgical evaluation as needed  F/U neurological evaluation  May consider radiofrequency rhizolysis or intraspinal procedures pending response to present treatment and F/U evaluation   Patient to call Pain Management Center should patient have concerns prior to scheduled return appointmen. Facet Blocks Patient Information  Description: The facets are joints in the spine between the vertebrae.  Like any joints in the body, facets can become irritated and painful.  Arthritis can also effect the facets.  By injecting steroids and local anesthetic in and around these joints, we can temporarily block the nerve supply to them.  Steroids act directly on irritated nerves and tissues to reduce selling and inflammation which often leads to decreased pain.  Facet blocks may be done anywhere along the spine from the neck to the low back depending upon the location of your pain.   After numbing the skin with local anesthetic (like Novocaine), a small needle is passed onto the facet joints under x-ray guidance.  You may experience a sensation of pressure while this is being done.  The entire block usually lasts about 15-25 minutes.   Conditions which may be treated by facet blocks:   Low back/buttock pain  Neck/shoulder pain  Certain types of headaches  Preparation for the injection:  1. Do not eat any solid food or dairy products within 6 hours of your appointment. 2. You may drink clear liquid up to 2 hours before appointment.  Clear liquids include water, black coffee, juice or soda.  No milk or cream please. 3. You may take your regular medication, including pain medications, with a sip of water before your appointment.  Diabetics should hold regular insulin (if taken  separately) and take 1/2 normal NPH dose the morning of the procedure.  Carry some sugar containing items with you to your appointment. 4. A driver must accompany you and be prepared to drive you home after your procedure. 5. Bring all your current medications with you. 6. An IV may be inserted and sedation may be given at the discretion of the physician. 7. A blood pressure cuff, EKG and other monitors will often be applied during the procedure.  Some patients may need to have extra oxygen administered for a short period. 8. You will be asked to provide medical information, including your allergies and medications, prior to the procedure.  We must know immediately if you are taking blood thinners (like Coumadin/Warfarin) or if you are allergic to IV iodine contrast (dye).  We must know if you could possible be pregnant.  Possible side-effects:   Bleeding from needle site  Infection (rare, may require surgery)  Nerve injury (rare)  Numbness & tingling (temporary)  Difficulty urinating (rare, temporary)  Spinal headache (a headache worse with upright posture)  Light-headedness (temporary)  Pain at injection site (serveral days)  Decreased blood pressure (rare, temporary)  Weakness in arm/leg (temporary)  Pressure sensation in back/neck (temporary)   Call if you experience:   Fever/chills associated with headache or increased back/neck pain  Headache worsened by an upright position  New onset, weakness or numbness of an extremity below the injection site  Hives or difficulty breathing (go to the emergency room)  Inflammation or drainage at the injection site(s)  Severe back/neck pain greater than usual  New symptoms which  are concerning to you  Please note:  Although the local anesthetic injected can often make your back or neck feel good for several hours after the injection, the pain will likely return. It takes 3-7 days for steroids to work.  You may not notice  any pain relief for at least one week.  If effective, we will often do a series of 2-3 injections spaced 3-6 weeks apart to maximally decrease your pain.  After the initial series, you may be a candidate for a more permanent nerve block of the facets.  If you have any questions, please call #[phone] [place]  What are the risk, side effects and possible complications? Generally speaking, most procedures are safe.  However, with any procedure there are risks, side effects, and the possibility of complications.  The risks and complications are dependent upon the sites that are lesioned, or the type of nerve block to be performed.  The closer the procedure is to the spine, the more serious the risks are.  Great care is taken when placing the radio frequency needles, block needles or lesioning probes, but sometimes complications can occur. 1. Infection: Any time there is an injection through the skin, there is a risk of infection.  This is why sterile conditions are used for these blocks.  There are four possible types of infection. 1. Localized skin infection. 2. Central Nervous System Infection-This can be in the form of Meningitis, which can be deadly. 3. Epidural Infections-This can be in the form of an epidural abscess, which can cause pressure inside of the spine, causing compression of the spinal cord with subsequent paralysis. This would require an emergency surgery to decompress, and there are no guarantees that the patient would recover from the paralysis. 4. Discitis-This is an infection of the intervertebral discs.  It occurs in about 1% of discography procedures.  It is difficult to treat and it may lead to surgery.        2. Pain: the needles have to go through skin and soft tissues, will cause soreness.       3. Damage to internal structures:  The nerves to be lesioned may be near blood vessels or    other nerves  which can be potentially damaged.       4. Bleeding: Bleeding is more common if the patient is taking blood thinners such as  aspirin, Coumadin, Ticiid, Plavix, etc., or if he/she have some genetic predisposition  such as hemophilia. Bleeding into the spinal canal can cause compression of the spinal  cord with subsequent paralysis.  This would require an emergency surgery to  decompress and there are no guarantees that the patient would recover from the  paralysis.       5. Pneumothorax:  Puncturing of a lung is a possibility, every time a needle is introduced in  the area of the chest or upper back.  Pneumothorax refers to free air around the  collapsed lung(s), inside of the thoracic cavity (chest cavity).  Another two possible  complications related to a similar event would include: Hemothorax and Chylothorax.   These are variations of the Pneumothorax, where instead of air around the collapsed  lung(s), you may have blood or chyle, respectively.       6. Spinal headaches: They may occur with any procedures in the area of the spine.       7. Persistent CSF (Cerebro-Spinal Fluid) leakage: This is a rare  problem, but may occur  with prolonged intrathecal or epidural catheters either due to the formation of a fistulous  track or a dural tear.       8. Nerve damage: By working so close to the spinal cord, there is always a possibility of  nerve damage, which could be as serious as a permanent spinal cord injury with  paralysis.       9. Death:  Although rare, severe deadly allergic reactions known as "Anaphylactic  reaction" can occur to any of the medications used.      10. Worsening of the symptoms:  We can always make thing worse.  What are the chances of something like this happening? Chances of any of this occuring are extremely low.  By statistics, you have more of a chance of getting killed in a motor vehicle accident: while driving to the hospital than any of the above occurring .  Nevertheless, you  should be aware that they are possibilities.  In general, it is similar to taking a shower.  Everybody knows that you can slip, hit your head and get killed.  Does that mean that you should not shower again?  Nevertheless always keep in mind that statistics do not mean anything if you happen to be on the wrong side of them.  Even if a procedure has a 1 (one) in a 1,000,000 (million) chance of going wrong, it you happen to be that one..Also, keep in mind that by statistics, you have more of a chance of having something go wrong when taking medications.  Who should not have this procedure? If you are on a blood thinning medication (e.g. Coumadin, Plavix, see list of "Blood Thinners"), or if you have an active infection going on, you should not have the procedure.  If you are taking any blood thinners, please inform your physician.  How should I prepare for this procedure?  Do not eat or drink anything at least six hours prior to the procedure.  Bring a driver with you .  It cannot be a taxi.  Come accompanied by an adult that can drive you back, and that is strong enough to help you if your legs get weak or numb from the local anesthetic.  Take all of your medicines the morning of the procedure with just enough water to swallow them.  If you have diabetes, make sure that you are scheduled to have your procedure done first thing in the morning, whenever possible.  If you have diabetes, take only half of your insulin dose and notify our nurse that you have done so as soon as you arrive at the clinic.  If you are diabetic, but only take blood sugar pills (oral hypoglycemic), then do not take them on the morning of your procedure.  You may take them after you have had the procedure.  Do not take aspirin or any aspirin-containing medications, at least eleven (11) days prior to the procedure.  They may prolong bleeding.  Wear loose fitting clothing that may be easy to take off and that you would not  mind if it got stained with Betadine or blood.  Do not wear any jewelry or perfume  Remove any nail coloring.  It will interfere with some of our monitoring equipment.  NOTE: Remember that this is not meant to be interpreted as a complete list of all possible complications.  Unforeseen problems may occur.  BLOOD THINNERS The following drugs contain aspirin or other products, which can  cause increased bleeding during surgery and should not be taken for 2 weeks prior to and 1 week after surgery.  If you should need take something for relief of minor pain, you may take acetaminophen which is found in Tylenol,m Datril, Anacin-3 and Panadol. It is not blood thinner. The products listed below are.  Do not take any of the products listed below in addition to any listed on your instruction sheet.  A.P.C or A.P.C with Codeine Codeine Phosphate Capsules #3 Ibuprofen Ridaura  ABC compound Congesprin Imuran rimadil  Advil Cope Indocin Robaxisal  Alka-Seltzer Effervescent Pain Reliever and Antacid Coricidin or Coricidin-D  Indomethacin Rufen  Alka-Seltzer plus Cold Medicine Cosprin Ketoprofen S-A-C Tablets  Anacin Analgesic Tablets or Capsules Coumadin Korlgesic Salflex  Anacin Extra Strength Analgesic tablets or capsules CP-2 Tablets Lanoril Salicylate  Anaprox Cuprimine Capsules Levenox Salocol  Anexsia-D Dalteparin Magan Salsalate  Anodynos Darvon compound Magnesium Salicylate Sine-off  Ansaid Dasin Capsules Magsal Sodium Salicylate  Anturane Depen Capsules Marnal Soma  APF Arthritis pain formula Dewitt's Pills Measurin Stanback  Argesic Dia-Gesic Meclofenamic Sulfinpyrazone  Arthritis Bayer Timed Release Aspirin Diclofenac Meclomen Sulindac  Arthritis pain formula Anacin Dicumarol Medipren Supac  Analgesic (Safety coated) Arthralgen Diffunasal Mefanamic Suprofen  Arthritis Strength Bufferin Dihydrocodeine Mepro Compound Suprol  Arthropan liquid Dopirydamole Methcarbomol with Aspirin Synalgos   ASA tablets/Enseals Disalcid Micrainin Tagament  Ascriptin Doan's Midol Talwin  Ascriptin A/D Dolene Mobidin Tanderil  Ascriptin Extra Strength Dolobid Moblgesic Ticlid  Ascriptin with Codeine Doloprin or Doloprin with Codeine Momentum Tolectin  Asperbuf Duoprin Mono-gesic Trendar  Aspergum Duradyne Motrin or Motrin IB Triminicin  Aspirin plain, buffered or enteric coated Durasal Myochrisine Trigesic  Aspirin Suppositories Easprin Nalfon Trillsate  Aspirin with Codeine Ecotrin Regular or Extra Strength Naprosyn Uracel  Atromid-S Efficin Naproxen Ursinus  Auranofin Capsules Elmiron Neocylate Vanquish  Axotal Emagrin Norgesic Verin  Azathioprine Empirin or Empirin with Codeine Normiflo Vitamin E  Azolid Emprazil Nuprin Voltaren  Bayer Aspirin plain, buffered or children's or timed BC Tablets or powders Encaprin Orgaran Warfarin Sodium  Buff-a-Comp Enoxaparin Orudis Zorpin  Buff-a-Comp with Codeine Equegesic Os-Cal-Gesic   Buffaprin Excedrin plain, buffered or Extra Strength Oxalid   Bufferin Arthritis Strength Feldene Oxphenbutazone   Bufferin plain or Extra Strength Feldene Capsules Oxycodone with Aspirin   Bufferin with Codeine Fenoprofen Fenoprofen Pabalate or Pabalate-SF   Buffets II Flogesic Panagesic   Buffinol plain or Extra Strength Florinal or Florinal with Codeine Panwarfarin   Buf-Tabs Flurbiprofen Penicillamine   Butalbital Compound Four-way cold tablets Penicillin   Butazolidin Fragmin Pepto-Bismol   Carbenicillin Geminisyn Percodan   Carna Arthritis Reliever Geopen Persantine   Carprofen Gold's salt Persistin   Chloramphenicol Goody's Phenylbutazone   Chloromycetin Haltrain Piroxlcam   Clmetidine heparin Plaquenil   Cllnoril Hyco-pap Ponstel   Clofibrate Hydroxy chloroquine Propoxyphen         Before stopping any of these medications, be sure to consult the physician who ordered them.  Some, such as Coumadin (Warfarin) are ordered to prevent or treat serious  conditions such as "deep thrombosis", "pumonary embolisms", and other heart problems.  The amount of time that you may need off of the medication may also vary with the medication and the reason for which you were taking it.  If you are taking any of these medications, please make sure you notify your pain physician before you undergo any procedures.

## 2014-11-02 ENCOUNTER — Encounter: Payer: Self-pay | Admitting: Pain Medicine

## 2014-11-02 ENCOUNTER — Ambulatory Visit: Payer: PRIVATE HEALTH INSURANCE | Attending: Pain Medicine | Admitting: Pain Medicine

## 2014-11-02 VITALS — BP 105/63 | HR 63 | Resp 16

## 2014-11-02 DIAGNOSIS — M5126 Other intervertebral disc displacement, lumbar region: Secondary | ICD-10-CM | POA: Insufficient documentation

## 2014-11-02 DIAGNOSIS — M5030 Other cervical disc degeneration, unspecified cervical region: Secondary | ICD-10-CM

## 2014-11-02 DIAGNOSIS — M5481 Occipital neuralgia: Secondary | ICD-10-CM

## 2014-11-02 DIAGNOSIS — M545 Low back pain: Secondary | ICD-10-CM | POA: Diagnosis present

## 2014-11-02 DIAGNOSIS — M5136 Other intervertebral disc degeneration, lumbar region: Secondary | ICD-10-CM | POA: Diagnosis not present

## 2014-11-02 DIAGNOSIS — M5416 Radiculopathy, lumbar region: Secondary | ICD-10-CM

## 2014-11-02 DIAGNOSIS — M533 Sacrococcygeal disorders, not elsewhere classified: Secondary | ICD-10-CM

## 2014-11-02 DIAGNOSIS — M79604 Pain in right leg: Secondary | ICD-10-CM | POA: Diagnosis present

## 2014-11-02 DIAGNOSIS — M48062 Spinal stenosis, lumbar region with neurogenic claudication: Secondary | ICD-10-CM

## 2014-11-02 DIAGNOSIS — G43109 Migraine with aura, not intractable, without status migrainosus: Secondary | ICD-10-CM

## 2014-11-02 DIAGNOSIS — M79605 Pain in left leg: Secondary | ICD-10-CM | POA: Diagnosis present

## 2014-11-02 MED ORDER — FENTANYL CITRATE (PF) 100 MCG/2ML IJ SOLN
INTRAMUSCULAR | Status: AC
  Administered 2014-11-02: 100 ug via INTRAVENOUS
  Filled 2014-11-02: qty 2

## 2014-11-02 MED ORDER — ORPHENADRINE CITRATE 30 MG/ML IJ SOLN
INTRAMUSCULAR | Status: AC
  Filled 2014-11-02: qty 2

## 2014-11-02 MED ORDER — CEFUROXIME AXETIL 250 MG PO TABS: 250.0000 mg | ORAL_TABLET | Freq: Two times a day (BID) | ORAL | Status: DC

## 2014-11-02 MED ORDER — CEFAZOLIN SODIUM 1 G IJ SOLR
INTRAMUSCULAR | Status: AC
  Administered 2014-11-02: 1 g via INTRAVENOUS
  Filled 2014-11-02: qty 10

## 2014-11-02 MED ORDER — TRIAMCINOLONE ACETONIDE 40 MG/ML IJ SUSP
INTRAMUSCULAR | Status: AC
  Administered 2014-11-02: 09:00:00
  Filled 2014-11-02: qty 1

## 2014-11-02 MED ORDER — BUPIVACAINE HCL (PF) 0.25 % IJ SOLN
INTRAMUSCULAR | Status: AC
  Administered 2014-11-02: 09:00:00
  Filled 2014-11-02: qty 30

## 2014-11-02 MED ORDER — MIDAZOLAM HCL 5 MG/5ML IJ SOLN
INTRAMUSCULAR | Status: AC
  Administered 2014-11-02: 3 mg via INTRAVENOUS
  Filled 2014-11-02: qty 5

## 2014-11-02 NOTE — Patient Instructions (Addendum)
Continue present medications and begin taking antibiotic Ceftin as prescribed. Please obtain your antibiotic Ceftin today and begin taking antibiotic today  F/U PCP  Dr. Iona Beard for evaliation of  BP and general medical  condition.  F/U surgical evaluation as discussed  F/U neurological evaluation.  May consider radiofrequency rhizolysis or intraspinal procedures pending response to present treatment and F/U evaluation.  Patient to call [facility] should patient have concerns prior to scheduled return appointment.  Pain Management Discharge Instructions  General Discharge Instructions :  If you need to reach your doctor call: Monday-Friday 8:00 am - 4:00 pm at [phone] or toll free [phone].  After clinic hours [phone] to have operator reach doctor.  Bring all of your medication bottles to all your appointments in the pain clinic.  To cancel or reschedule your appointment with Pain Management please remember to call 24 hours in advance to avoid a fee.  Refer to the educational materials which you have been given on: General Risks, I had my Procedure. Discharge Instructions, Post Sedation.  Post Procedure Instructions:  The drugs you were given will stay in your system until tomorrow, so for the next 24 hours you should not drive, make any legal decisions or drink any alcoholic beverages.  You may eat anything you prefer, but it is better to start with liquids then soups and crackers, and gradually work up to solid foods.  Please notify your doctor immediately if you have any unusual bleeding, trouble breathing or pain that is not related to your normal pain.  Depending on the type of procedure that was done, some parts of your body may feel week and/or numb.  This usually clears up by tonight or the next day.  Walk with the use of an assistive device or accompanied by an adult for the 24 hours.  You may use ice on the affected area for the first 24 hours.   Put ice in a Ziploc bag and cover with a towel and place against area 15 minutes on 15 minutes off.  You may switch to heat after 24 hours.GENERAL RISKS AND COMPLICATIONS  What are the risk, side effects and possible complications? Generally speaking, most procedures are safe.  However, with any procedure there are risks, side effects, and the possibility of complications.  The risks and complications are dependent upon the sites that are lesioned, or the type of nerve block to be performed.  The closer the procedure is to the spine, the more serious the risks are.  Great care is taken when placing the radio frequency needles, block needles or lesioning probes, but sometimes complications can occur. 1. Infection: Any time there is an injection through the skin, there is a risk of infection.  This is why sterile conditions are used for these blocks.  There are four possible types of infection. 1. Localized skin infection. 2. Central Nervous System Infection-This can be in the form of Meningitis, which can be deadly. 3. Epidural Infections-This can be in the form of an epidural abscess, which can cause pressure inside of the spine, causing compression of the spinal cord with subsequent paralysis. This would require an emergency surgery to decompress, and there are no guarantees that the patient would recover from the paralysis. 4. Discitis-This is an infection of the intervertebral discs.  It occurs in about 1% of discography procedures.  It is difficult to treat and it may lead to surgery.        2. Pain: the needles have  to go through skin and soft tissues, will cause soreness.       3. Damage to internal structures:  The nerves to be lesioned may be near blood vessels or    other nerves which can be potentially damaged.       4. Bleeding: Bleeding is more common if the patient is taking blood thinners such as  aspirin, Coumadin, Ticiid, Plavix, etc., or if he/she have some genetic predisposition  such  as hemophilia. Bleeding into the spinal canal can cause compression of the spinal  cord with subsequent paralysis.  This would require an emergency surgery to  decompress and there are no guarantees that the patient would recover from the  paralysis.       5. Pneumothorax:  Puncturing of a lung is a possibility, every time a needle is introduced in  the area of the chest or upper back.  Pneumothorax refers to free air around the  collapsed lung(s), inside of the thoracic cavity (chest cavity).  Another two possible  complications related to a similar event would include: Hemothorax and Chylothorax.   These are variations of the Pneumothorax, where instead of air around the collapsed  lung(s), you may have blood or chyle, respectively.       6. Spinal headaches: They may occur with any procedures in the area of the spine.       7. Persistent CSF (Cerebro-Spinal Fluid) leakage: This is a rare problem, but may occur  with prolonged intrathecal or epidural catheters either due to the formation of a fistulous  track or a dural tear.       8. Nerve damage: By working so close to the spinal cord, there is always a possibility of  nerve damage, which could be as serious as a permanent spinal cord injury with  paralysis.       9. Death:  Although rare, severe deadly allergic reactions known as "Anaphylactic  reaction" can occur to any of the medications used.      10. Worsening of the symptoms:  We can always make thing worse.  What are the chances of something like this happening? Chances of any of this occuring are extremely low.  By statistics, you have more of a chance of getting killed in a motor vehicle accident: while driving to the hospital than any of the above occurring .  Nevertheless, you should be aware that they are possibilities.  In general, it is similar to taking a shower.  Everybody knows that you can slip, hit your head and get killed.  Does that mean that you should not shower again?   Nevertheless always keep in mind that statistics do not mean anything if you happen to be on the wrong side of them.  Even if a procedure has a 1 (one) in a 1,000,000 (million) chance of going wrong, it you happen to be that one..Also, keep in mind that by statistics, you have more of a chance of having something go wrong when taking medications.  Who should not have this procedure? If you are on a blood thinning medication (e.g. Coumadin, Plavix, see list of "Blood Thinners"), or if you have an active infection going on, you should not have the procedure.  If you are taking any blood thinners, please inform your physician.  How should I prepare for this procedure?  Do not eat or drink anything at least six hours prior to the procedure.  Bring a driver with you .  It  cannot be a taxi.  Come accompanied by an adult that can drive you back, and that is strong enough to help you if your legs get weak or numb from the local anesthetic.  Take all of your medicines the morning of the procedure with just enough water to swallow them.  If you have diabetes, make sure that you are scheduled to have your procedure done first thing in the morning, whenever possible.  If you have diabetes, take only half of your insulin dose and notify our nurse that you have done so as soon as you arrive at the clinic.  If you are diabetic, but only take blood sugar pills (oral hypoglycemic), then do not take them on the morning of your procedure.  You may take them after you have had the procedure.  Do not take aspirin or any aspirin-containing medications, at least eleven (11) days prior to the procedure.  They may prolong bleeding.  Wear loose fitting clothing that may be easy to take off and that you would not mind if it got stained with Betadine or blood.  Do not wear any jewelry or perfume  Remove any nail coloring.  It will interfere with some of our monitoring equipment.  NOTE: Remember that this is not  meant to be interpreted as a complete list of all possible complications.  Unforeseen problems may occur.  BLOOD THINNERS The following drugs contain aspirin or other products, which can cause increased bleeding during surgery and should not be taken for 2 weeks prior to and 1 week after surgery.  If you should need take something for relief of minor pain, you may take acetaminophen which is found in Tylenol,m Datril, Anacin-3 and Panadol. It is not blood thinner. The products listed below are.  Do not take any of the products listed below in addition to any listed on your instruction sheet.  A.P.C or A.P.C with Codeine Codeine Phosphate Capsules #3 Ibuprofen Ridaura  ABC compound Congesprin Imuran rimadil  Advil Cope Indocin Robaxisal  Alka-Seltzer Effervescent Pain Reliever and Antacid Coricidin or Coricidin-D  Indomethacin Rufen  Alka-Seltzer plus Cold Medicine Cosprin Ketoprofen S-A-C Tablets  Anacin Analgesic Tablets or Capsules Coumadin Korlgesic Salflex  Anacin Extra Strength Analgesic tablets or capsules CP-2 Tablets Lanoril Salicylate  Anaprox Cuprimine Capsules Levenox Salocol  Anexsia-D Dalteparin Magan Salsalate  Anodynos Darvon compound Magnesium Salicylate Sine-off  Ansaid Dasin Capsules Magsal Sodium Salicylate  Anturane Depen Capsules Marnal Soma  APF Arthritis pain formula Dewitt's Pills Measurin Stanback  Argesic Dia-Gesic Meclofenamic Sulfinpyrazone  Arthritis Bayer Timed Release Aspirin Diclofenac Meclomen Sulindac  Arthritis pain formula Anacin Dicumarol Medipren Supac  Analgesic (Safety coated) Arthralgen Diffunasal Mefanamic Suprofen  Arthritis Strength Bufferin Dihydrocodeine Mepro Compound Suprol  Arthropan liquid Dopirydamole Methcarbomol with Aspirin Synalgos  ASA tablets/Enseals Disalcid Micrainin Tagament  Ascriptin Doan's Midol Talwin  Ascriptin A/D Dolene Mobidin Tanderil  Ascriptin Extra Strength Dolobid Moblgesic Ticlid  Ascriptin with Codeine Doloprin or  Doloprin with Codeine Momentum Tolectin  Asperbuf Duoprin Mono-gesic Trendar  Aspergum Duradyne Motrin or Motrin IB Triminicin  Aspirin plain, buffered or enteric coated Durasal Myochrisine Trigesic  Aspirin Suppositories Easprin Nalfon Trillsate  Aspirin with Codeine Ecotrin Regular or Extra Strength Naprosyn Uracel  Atromid-S Efficin Naproxen Ursinus  Auranofin Capsules Elmiron Neocylate Vanquish  Axotal Emagrin Norgesic Verin  Azathioprine Empirin or Empirin with Codeine Normiflo Vitamin E  Azolid Emprazil Nuprin Voltaren  Bayer Aspirin plain, buffered or children's or timed BC Tablets or powders Encaprin Orgaran Warfarin Sodium  Buff-a-Comp Enoxaparin Orudis Zorpin  Buff-a-Comp with Codeine Equegesic Os-Cal-Gesic   Buffaprin Excedrin plain, buffered or Extra Strength Oxalid   Bufferin Arthritis Strength Feldene Oxphenbutazone   Bufferin plain or Extra Strength Feldene Capsules Oxycodone with Aspirin   Bufferin with Codeine Fenoprofen Fenoprofen Pabalate or Pabalate-SF   Buffets II Flogesic Panagesic   Buffinol plain or Extra Strength Florinal or Florinal with Codeine Panwarfarin   Buf-Tabs Flurbiprofen Penicillamine   Butalbital Compound Four-way cold tablets Penicillin   Butazolidin Fragmin Pepto-Bismol   Carbenicillin Geminisyn Percodan   Carna Arthritis Reliever Geopen Persantine   Carprofen Gold's salt Persistin   Chloramphenicol Goody's Phenylbutazone   Chloromycetin Haltrain Piroxlcam   Clmetidine heparin Plaquenil   Cllnoril Hyco-pap Ponstel   Clofibrate Hydroxy chloroquine Propoxyphen         Before stopping any of these medications, be sure to consult the physician who ordered them.  Some, such as Coumadin (Warfarin) are ordered to prevent or treat serious conditions such as "deep thrombosis", "pumonary embolisms", and other heart problems.  The amount of time that you may need off of the medication may also vary with the medication and the reason for which you were  taking it.  If you are taking any of these medications, please make sure you notify your pain physician before you undergo any procedures.

## 2014-11-02 NOTE — Progress Notes (Signed)
Safety precautions to be maintained throughout the outpatient stay will include: orient to surroundings, keep bed in low position, maintain call bell within reach at all times, provide assistance with transfer out of bed and ambulation.  

## 2014-11-02 NOTE — Progress Notes (Signed)
Subjective:    [name], female    DOB: [birth year], 57 y.o.   MRN: [id number]  HPI  PROCEDURE PERFORMED: Lumbar facet (medial branch block)   NOTE: The patient is a 59 y.o. female who returns to [place] for further evaluation and treatment of pain involving the lumbar and lower extremity region. 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 Mild facet hypertrophy L5-S1 without significant foraminal stenosis. The risks, benefits, and expectations of the procedure have been discussed and explained to the patient who was understanding and in agreement with suggested treatment plan. We will proceed with interventional treatment as discussed and as explained to the patient who was understanding and wished to proceed with procedure as planned.   DESCRIPTION OF PROCEDURE: Lumbar facet (medial branch block) with IV Versed, IV fentanyl conscious sedation, EKG, blood pressure, pulse, and pulse oximetry monitoring. The procedure was performed with the patient in the prone position. Betadine prep of proposed entry site performed.   NEEDLE PLACEMENT AT: Left L 2 lumbar facet (medial branch block). Under fluoroscopic guidance with oblique orientation of 15 degrees, a 22-gauge needle was inserted at the L 2 vertebral body level with needle placed at the targeted area of Burton's Eye or Eye of the Scotty Dog with documentation of needle placement in the superior and lateral border of targeted area of Burton's Eye or Eye of the Scotty Dog with oblique orientation of 15 degrees. Following documentation of needle placement at the L 2 vertebral body level, needle placement was then accomplished at the L 3 vertebral body level.   NEEDLE PLACEMENT AT L3, L4, and L5 VERTEBRAL BODY LEVELS ON THE LEFT SIDE The procedure was performed at the L3, L4, and L5 vertebral  body levels exactly as was performed at the L 2 vertebral body level utilizing the same technique and under fluoroscopic guidance.  NEEDLE PLACEMENT AT THE SACRAL ALA with AP view of the lumbosacral spine. With the patient in the prone position, Betadine prep of proposed entry site accomplished, a 22 gauge needle was inserted in the region of the sacral ala (groove formed by the superior articulating process of S1 and the sacral wing)..    Needle placement was then verified at all levels on lateral view. Following documentation of needle placement at all levels on lateral view and following negative aspiration for heme and CSF, each level was injected with 1 mL of 0.25% bupivacaine with Kenalog.     LUMBAR FACET, MEDIAL BRANCH NERVE, BLOCKS PERFORMED ON THE RIGHT SIDE   The procedure was performed on the right side exactly as was performed on the left side at the same levels and utilizing the same technique under fluoroscopic guidance.     The patient tolerated the procedure well. A total of 40 mg of Kenalog was utilized for the procedure.   PLAN:  1. Medications: The patient will continue presently prescribed medication hydrocodone acetaminophen. 2. May consider modification of treatment regimen at time of return appointment pending response to treatment rendered on today's visit. 3. The patient is to follow-up with primary care physician Dr. Iona Beard for further evaluation of blood pressure and general medical condition status post steroid injection performed on today's visit. 4. Surgical follow-up evaluation. 5. Neurological follow-up evaluation. 6. The patient may be candidate for radiofrequency procedures, implantation type procedures, and other treatment pending response to treatment and  follow-up evaluation. 7. The patient has been advised to call the Pain Management Center prior to scheduled return appointment should there be significant change in condition or should patient have  other concerns regarding condition prior to scheduled return appointment.  The patient is understanding and in agreement with suggested treatment plan.     Review of Systems     Objective:   Physical Exam        Assessment & Plan:

## 2014-11-03 ENCOUNTER — Telehealth: Payer: Self-pay | Admitting: *Deleted

## 2014-11-03 NOTE — Telephone Encounter (Signed)
Message left

## 2014-11-04 ENCOUNTER — Telehealth: Payer: Self-pay | Admitting: Pain Medicine

## 2014-11-04 NOTE — Telephone Encounter (Signed)
Nursing assistant staff spoke with patient earlier today, instructed patient to go to ED. Nurse called patient again, not knowing someone has already spoken to her. States she took some pain meds, and if that doesn't help, she will go to ED.

## 2014-11-04 NOTE — Telephone Encounter (Signed)
Was at sink washing a pot and something happened / extreme pain cannot move or straighten up

## 2014-11-06 NOTE — Telephone Encounter (Signed)
Nurses and Staff,  Please have patient to ask evaluating MD to call me to discuss patient's condition.

## 2014-11-07 NOTE — Telephone Encounter (Signed)
Appt scheduled for 11-14-14 at 1230. Patient notified.

## 2014-11-07 NOTE — Telephone Encounter (Signed)
Called to check on Rachael Jordan today, states pain is better than it was on Friday, but still bad. She did not go to the ED, but applied heating pad and took meds and rested. She is asking if you want to see her?

## 2014-11-07 NOTE — Telephone Encounter (Signed)
Nurses a Staff Please schedule patient for evaluation on Monday, 11/14/2014

## 2014-11-14 ENCOUNTER — Encounter: Payer: Self-pay | Admitting: Pain Medicine

## 2014-11-14 ENCOUNTER — Ambulatory Visit: Payer: PRIVATE HEALTH INSURANCE | Attending: Pain Medicine | Admitting: Pain Medicine

## 2014-11-14 VITALS — BP 120/79 | HR 93 | Temp 98.5°F | Resp 16 | Ht 66.0 in | Wt 164.0 lb

## 2014-11-14 DIAGNOSIS — M5030 Other cervical disc degeneration, unspecified cervical region: Secondary | ICD-10-CM

## 2014-11-14 DIAGNOSIS — M546 Pain in thoracic spine: Secondary | ICD-10-CM | POA: Diagnosis present

## 2014-11-14 DIAGNOSIS — M533 Sacrococcygeal disorders, not elsewhere classified: Secondary | ICD-10-CM

## 2014-11-14 DIAGNOSIS — M51369 Other intervertebral disc degeneration, lumbar region without mention of lumbar back pain or lower extremity pain: Secondary | ICD-10-CM

## 2014-11-14 DIAGNOSIS — M5481 Occipital neuralgia: Secondary | ICD-10-CM

## 2014-11-14 DIAGNOSIS — M48062 Spinal stenosis, lumbar region with neurogenic claudication: Secondary | ICD-10-CM

## 2014-11-14 DIAGNOSIS — G43109 Migraine with aura, not intractable, without status migrainosus: Secondary | ICD-10-CM

## 2014-11-14 DIAGNOSIS — M5126 Other intervertebral disc displacement, lumbar region: Secondary | ICD-10-CM | POA: Insufficient documentation

## 2014-11-14 DIAGNOSIS — M542 Cervicalgia: Secondary | ICD-10-CM | POA: Diagnosis present

## 2014-11-14 DIAGNOSIS — M5136 Other intervertebral disc degeneration, lumbar region: Secondary | ICD-10-CM | POA: Insufficient documentation

## 2014-11-14 DIAGNOSIS — M5416 Radiculopathy, lumbar region: Secondary | ICD-10-CM

## 2014-11-14 MED ORDER — HYDROCODONE-ACETAMINOPHEN 7.5-325 MG PO TABS: ORAL_TABLET | ORAL | Status: DC

## 2014-11-14 NOTE — Progress Notes (Signed)
Discharged to home ambulatory with script in hand for Hydrocodone.  Pre procedure instructions given with teach back 3 done.

## 2014-11-14 NOTE — Patient Instructions (Addendum)
Continue present medication hydrocodone acetaminophen  Block of nerves to the sacroiliac joint to be performed at time of return appointment  F/U PCP Dr. Iona Beard for evaliation of  BP and general medical  condition  F/U surgical evaluation  F/U neurological evaluation  May consider radiofrequency rhizolysis or intraspinal procedures pending response to present treatment and F/U evaluation   Patient to call [facility] should patient have concerns prior to scheduled return appointment. GENERAL RISKS AND COMPLICATIONS  What are the risk, side effects and possible complications? Generally speaking, most procedures are safe.  However, with any procedure there are risks, side effects, and the possibility of complications.  The risks and complications are dependent upon the sites that are lesioned, or the type of nerve block to be performed.  The closer the procedure is to the spine, the more serious the risks are.  Great care is taken when placing the radio frequency needles, block needles or lesioning probes, but sometimes complications can occur. 1. Infection: Any time there is an injection through the skin, there is a risk of infection.  This is why sterile conditions are used for these blocks.  There are four possible types of infection. 1. Localized skin infection. 2. Central Nervous System Infection-This can be in the form of Meningitis, which can be deadly. 3. Epidural Infections-This can be in the form of an epidural abscess, which can cause pressure inside of the spine, causing compression of the spinal cord with subsequent paralysis. This would require an emergency surgery to decompress, and there are no guarantees that the patient would recover from the paralysis. 4. Discitis-This is an infection of the intervertebral discs.  It occurs in about 1% of discography procedures.  It is difficult to treat and it may lead to surgery.        2. Pain: the needles have to go through skin  and soft tissues, will cause soreness.       3. Damage to internal structures:  The nerves to be lesioned may be near blood vessels or    other nerves which can be potentially damaged.       4. Bleeding: Bleeding is more common if the patient is taking blood thinners such as  aspirin, Coumadin, Ticiid, Plavix, etc., or if he/she have some genetic predisposition  such as hemophilia. Bleeding into the spinal canal can cause compression of the spinal  cord with subsequent paralysis.  This would require an emergency surgery to  decompress and there are no guarantees that the patient would recover from the  paralysis.       5. Pneumothorax:  Puncturing of a lung is a possibility, every time a needle is introduced in  the area of the chest or upper back.  Pneumothorax refers to free air around the  collapsed lung(s), inside of the thoracic cavity (chest cavity).  Another two possible  complications related to a similar event would include: Hemothorax and Chylothorax.   These are variations of the Pneumothorax, where instead of air around the collapsed  lung(s), you may have blood or chyle, respectively.       6. Spinal headaches: They may occur with any procedures in the area of the spine.       7. Persistent CSF (Cerebro-Spinal Fluid) leakage: This is a rare problem, but may occur  with prolonged intrathecal or epidural catheters either due to the formation of a fistulous  track or a dural tear.       8. Nerve  damage: By working so close to the spinal cord, there is always a possibility of  nerve damage, which could be as serious as a permanent spinal cord injury with  paralysis.       9. Death:  Although rare, severe deadly allergic reactions known as "Anaphylactic  reaction" can occur to any of the medications used.      10. Worsening of the symptoms:  We can always make thing worse.  What are the chances of something like this happening? Chances of any of this occuring are extremely low.  By statistics,  you have more of a chance of getting killed in a motor vehicle accident: while driving to the hospital than any of the above occurring .  Nevertheless, you should be aware that they are possibilities.  In general, it is similar to taking a shower.  Everybody knows that you can slip, hit your head and get killed.  Does that mean that you should not shower again?  Nevertheless always keep in mind that statistics do not mean anything if you happen to be on the wrong side of them.  Even if a procedure has a 1 (one) in a 1,000,000 (million) chance of going wrong, it you happen to be that one..Also, keep in mind that by statistics, you have more of a chance of having something go wrong when taking medications.  Who should not have this procedure? If you are on a blood thinning medication (e.g. Coumadin, Plavix, see list of "Blood Thinners"), or if you have an active infection going on, you should not have the procedure.  If you are taking any blood thinners, please inform your physician.  How should I prepare for this procedure?  Do not eat or drink anything at least six hours prior to the procedure.  Bring a driver with you .  It cannot be a taxi.  Come accompanied by an adult that can drive you back, and that is strong enough to help you if your legs get weak or numb from the local anesthetic.  Take all of your medicines the morning of the procedure with just enough water to swallow them.  If you have diabetes, make sure that you are scheduled to have your procedure done first thing in the morning, whenever possible.  If you have diabetes, take only half of your insulin dose and notify our nurse that you have done so as soon as you arrive at the clinic.  If you are diabetic, but only take blood sugar pills (oral hypoglycemic), then do not take them on the morning of your procedure.  You may take them after you have had the procedure.  Do not take aspirin or any aspirin-containing medications, at  least eleven (11) days prior to the procedure.  They may prolong bleeding.  Wear loose fitting clothing that may be easy to take off and that you would not mind if it got stained with Betadine or blood.  Do not wear any jewelry or perfume  Remove any nail coloring.  It will interfere with some of our monitoring equipment.  NOTE: Remember that this is not meant to be interpreted as a complete list of all possible complications.  Unforeseen problems may occur.  BLOOD THINNERS The following drugs contain aspirin or other products, which can cause increased bleeding during surgery and should not be taken for 2 weeks prior to and 1 week after surgery.  If you should need take something for relief of minor pain, you  may take acetaminophen which is found in Tylenol,m Datril, Anacin-3 and Panadol. It is not blood thinner. The products listed below are.  Do not take any of the products listed below in addition to any listed on your instruction sheet.  A.P.C or A.P.C with Codeine Codeine Phosphate Capsules #3 Ibuprofen Ridaura  ABC compound Congesprin Imuran rimadil  Advil Cope Indocin Robaxisal  Alka-Seltzer Effervescent Pain Reliever and Antacid Coricidin or Coricidin-D  Indomethacin Rufen  Alka-Seltzer plus Cold Medicine Cosprin Ketoprofen S-A-C Tablets  Anacin Analgesic Tablets or Capsules Coumadin Korlgesic Salflex  Anacin Extra Strength Analgesic tablets or capsules CP-2 Tablets Lanoril Salicylate  Anaprox Cuprimine Capsules Levenox Salocol  Anexsia-D Dalteparin Magan Salsalate  Anodynos Darvon compound Magnesium Salicylate Sine-off  Ansaid Dasin Capsules Magsal Sodium Salicylate  Anturane Depen Capsules Marnal Soma  APF Arthritis pain formula Dewitt's Pills Measurin Stanback  Argesic Dia-Gesic Meclofenamic Sulfinpyrazone  Arthritis Bayer Timed Release Aspirin Diclofenac Meclomen Sulindac  Arthritis pain formula Anacin Dicumarol Medipren Supac  Analgesic (Safety coated) Arthralgen  Diffunasal Mefanamic Suprofen  Arthritis Strength Bufferin Dihydrocodeine Mepro Compound Suprol  Arthropan liquid Dopirydamole Methcarbomol with Aspirin Synalgos  ASA tablets/Enseals Disalcid Micrainin Tagament  Ascriptin Doan's Midol Talwin  Ascriptin A/D Dolene Mobidin Tanderil  Ascriptin Extra Strength Dolobid Moblgesic Ticlid  Ascriptin with Codeine Doloprin or Doloprin with Codeine Momentum Tolectin  Asperbuf Duoprin Mono-gesic Trendar  Aspergum Duradyne Motrin or Motrin IB Triminicin  Aspirin plain, buffered or enteric coated Durasal Myochrisine Trigesic  Aspirin Suppositories Easprin Nalfon Trillsate  Aspirin with Codeine Ecotrin Regular or Extra Strength Naprosyn Uracel  Atromid-S Efficin Naproxen Ursinus  Auranofin Capsules Elmiron Neocylate Vanquish  Axotal Emagrin Norgesic Verin  Azathioprine Empirin or Empirin with Codeine Normiflo Vitamin E  Azolid Emprazil Nuprin Voltaren  Bayer Aspirin plain, buffered or children's or timed BC Tablets or powders Encaprin Orgaran Warfarin Sodium  Buff-a-Comp Enoxaparin Orudis Zorpin  Buff-a-Comp with Codeine Equegesic Os-Cal-Gesic   Buffaprin Excedrin plain, buffered or Extra Strength Oxalid   Bufferin Arthritis Strength Feldene Oxphenbutazone   Bufferin plain or Extra Strength Feldene Capsules Oxycodone with Aspirin   Bufferin with Codeine Fenoprofen Fenoprofen Pabalate or Pabalate-SF   Buffets II Flogesic Panagesic   Buffinol plain or Extra Strength Florinal or Florinal with Codeine Panwarfarin   Buf-Tabs Flurbiprofen Penicillamine   Butalbital Compound Four-way cold tablets Penicillin   Butazolidin Fragmin Pepto-Bismol   Carbenicillin Geminisyn Percodan   Carna Arthritis Reliever Geopen Persantine   Carprofen Gold's salt Persistin   Chloramphenicol Goody's Phenylbutazone   Chloromycetin Haltrain Piroxlcam   Clmetidine heparin Plaquenil   Cllnoril Hyco-pap Ponstel   Clofibrate Hydroxy chloroquine Propoxyphen         Before  stopping any of these medications, be sure to consult the physician who ordered them.  Some, such as Coumadin (Warfarin) are ordered to prevent or treat serious conditions such as "deep thrombosis", "pumonary embolisms", and other heart problems.  The amount of time that you may need off of the medication may also vary with the medication and the reason for which you were taking it.  If you are taking any of these medications, please make sure you notify your pain physician before you undergo any procedures.         Selective Nerve Root Block Patient Information  Description: Specific nerve roots exit the spinal canal and these nerves can be compressed and inflamed by a bulging disc and bone spurs.  By injecting steroids on the nerve root, we can potentially decrease the inflammation surrounding these  nerves, which often leads to decreased pain.  Also, by injecting local anesthesia on the nerve root, this can provide [place] helpful information to give to your referring doctor if it decreases your pain.  Selective nerve root blocks can be done along the spine from the neck to the low back depending on the location of your pain.   After numbing the skin with local anesthesia, a small needle is passed to the nerve root and the position of the needle is verified using x-ray pictures.  After the needle is in correct position, we then deposit the medication.  You may experience a pressure sensation while this is being done.  The entire block usually lasts less than 15 minutes.  Conditions that may be treated with selective nerve root blocks:  Low back and leg pain  Spinal stenosis  Diagnostic block prior to potential surgery  Neck and arm pain  Post laminectomy syndrome  Preparation for the injection:  1. Do not eat any solid food or dairy products within 6 hours of your appointment. 2. You may drink clear liquids up to 2 hours before an appointment.  Clear liquids include water, black coffee,  juice or soda.  No milk or cream please. 3. You may take your regular medications, including pain medications, with a sip of water before your appointment.  Diabetics should hold regular insulin (if taken separately) and take 1/2 normal NPH dose the morning of the procedure.  Carry some sugar containing items with you to your appointment. 4. A driver must accompany you and be prepared to drive you home after your procedure. 5. Bring all your current medications with you. 6. An IV may be inserted and sedation may be given at the discretion of the physician. 7. A blood pressure cuff, EKG, and other monitors will often be applied during the procedure.  Some patients may need to have extra oxygen administered for a short period. 8. You will be asked to provide medical information, including allergies, prior to the procedure.  We must know immediately if you are taking blood  Thinners (like Coumadin) or if you are allergic to IV iodine contrast (dye).  Possible side-effects: All are usually temporary  Bleeding from needle site  Light headedness  Numbness and tingling  Decreased blood pressure  Weakness in arms/legs  Pressure sensation in back/neck  Pain at injection site (several days)  Possible complications: All are extremely rare  Infection  Nerve injury  Spinal headache (a headache wore with upright position)  Call if you experience:  Fever/chills associated with headache or increased back/neck pain  Headache worsened by an upright position  New onset weakness or numbness of an extremity below the injection site  Hives or difficulty breathing (go to the emergency room)  Inflammation or drainage at the injection site(s)  Severe back/neck pain greater than usual  New symptoms which are concerning to you  Please note:  Although the local anesthetic injected can often make your back or neck feel good for several hours after the injection the pain will likely return.   It takes 3-5 days for steroids to work on the nerve root. You may not notice any pain relief for at least one week.  If effective, we will often do a series of 3 injections spaced 3-6 weeks apart to maximally decrease your pain.    If you have any questions, please call [phone] [facility]

## 2014-11-14 NOTE — Progress Notes (Signed)
   Subjective:    [name], female    DOB: 08-08-1955, 59 y.o.   MRN: [id number]  HPI  Patient is a 59 year old female returns to [place] for further evaluation and treatment of pain involving neck upper back lower back and lower extremity regions. Patient states that she has some return of lower back pain with pain radiating across the back to the buttocks on the left as well as on the right. Patient states his pain is aggravated by standing walking twisting turning maneuvers. Patient has difficulty turning over in bed as well. Prolonged standing and walking aggravate patient's pain to severe degree. Patient denies any recent trauma change in events of daily living the call significant change in symptoms pathology. Patient's condition and will continue medications as prescribed this time. We'll consider patient for block of nerves to the sacroiliac joint at time return appointment in attempt to decrease severity of patient's symptoms, minimize progression of patient's symptoms, and avoid the need for more involved treatment. The patient was understanding and in agreement status treatment plan.    Review of Systems     Objective:   Physical Exam  There was tenderness of the splenius capitis and occipitalis musculature region of mild to moderate degree. There were no new lesions of the head and neck noted there was tenderness over the cervical facet cervical paraspinal musculature region of mild degree. Patient appeared to be unremarkable Spurling's maneuver with bilaterally equal grip strength. Tinel and Phalen's maneuver without increased pain of significant degree. There was tennis over the thoracic facet thoracic paraspinal muscles region a mild degree with no crepitus of the thoracic region noted. Palpation over the lumbar paraspinal muscles region lumbar facet region associated with moderate moderately severe discomfort. Lateral bending and rotation and  extension and palpation of the lumbar facets reproduce moderate to moderately severe discomfort. There was moderate to moderately severe tenderness to palpation over the PSIS and PII S regions. There was tenderness over the gluteal and piriformis musculature region of moderate to moderately severe degree as well mild tenderness of the greater trochanteric region and iliotibial band region. Straight leg raising tolerates approximately 30 without increased pain with dorsiflexion noted. There appeared to be negative clonus negative Homans. Abdomen nontender with no costovertebral maintenance noted.    Assessment & Plan:    Degenerative disc disease lumbar spine Shallow rightward disc protrusion with mild right subarticular narrowing. Mild facet hypertrophy L5-S1 without significant foraminal stenosis  Lumbar facet syndrome  Sacroiliac joint dysfunction  Bilateral occipital neuralgia    Plan  Continue present medication hydrocodone acetaminophen  Block of nerves to the sacroiliac joint to be performed at time return appointment  F/U PCP Dr. Iona Beard for evaliation of  BP and general medical  condition  F/U surgical evaluation as discussed  F/U neurological evaluation  May consider radiofrequency rhizolysis or intraspinal procedures pending response to present treatment and F/U evaluation   Patient to call [facility] Center should patient have concerns prior to scheduled return appointment.

## 2014-11-14 NOTE — Progress Notes (Signed)
Safety precautions to be maintained throughout the outpatient stay will include: orient to surroundings, keep bed in low position, maintain call bell within reach at all times, provide assistance with transfer out of bed and ambulation.  

## 2014-11-25 ENCOUNTER — Other Ambulatory Visit: Payer: Self-pay

## 2014-11-25 DIAGNOSIS — N3281 Overactive bladder: Secondary | ICD-10-CM

## 2014-11-25 MED ORDER — MIRABEGRON ER 25 MG PO TB24: 25.0000 mg | ORAL_TABLET | Freq: Every day | ORAL | Status: DC

## 2014-11-30 ENCOUNTER — Ambulatory Visit: Payer: PRIVATE HEALTH INSURANCE | Attending: Pain Medicine | Admitting: Pain Medicine

## 2014-11-30 ENCOUNTER — Encounter: Payer: Self-pay | Admitting: Pain Medicine

## 2014-11-30 VITALS — BP 127/63 | HR 79 | Temp 98.7°F | Resp 16 | Ht 65.0 in | Wt 166.0 lb

## 2014-11-30 DIAGNOSIS — M5136 Other intervertebral disc degeneration, lumbar region: Secondary | ICD-10-CM | POA: Insufficient documentation

## 2014-11-30 DIAGNOSIS — M5416 Radiculopathy, lumbar region: Secondary | ICD-10-CM

## 2014-11-30 DIAGNOSIS — M545 Low back pain: Secondary | ICD-10-CM | POA: Diagnosis present

## 2014-11-30 DIAGNOSIS — M79605 Pain in left leg: Secondary | ICD-10-CM | POA: Diagnosis present

## 2014-11-30 DIAGNOSIS — M79604 Pain in right leg: Secondary | ICD-10-CM | POA: Diagnosis present

## 2014-11-30 DIAGNOSIS — M5126 Other intervertebral disc displacement, lumbar region: Secondary | ICD-10-CM | POA: Diagnosis not present

## 2014-11-30 DIAGNOSIS — G43109 Migraine with aura, not intractable, without status migrainosus: Secondary | ICD-10-CM

## 2014-11-30 DIAGNOSIS — M533 Sacrococcygeal disorders, not elsewhere classified: Secondary | ICD-10-CM

## 2014-11-30 DIAGNOSIS — G43101 Migraine with aura, not intractable, with status migrainosus: Secondary | ICD-10-CM

## 2014-11-30 DIAGNOSIS — M5481 Occipital neuralgia: Secondary | ICD-10-CM

## 2014-11-30 DIAGNOSIS — M5030 Other cervical disc degeneration, unspecified cervical region: Secondary | ICD-10-CM

## 2014-11-30 DIAGNOSIS — M48062 Spinal stenosis, lumbar region with neurogenic claudication: Secondary | ICD-10-CM

## 2014-11-30 MED ORDER — BUPIVACAINE HCL (PF) 0.25 % IJ SOLN
30.0000 mL | Freq: Once | INTRAMUSCULAR | Status: AC
  Administered 2014-11-30: 30 mL

## 2014-11-30 MED ORDER — CEFUROXIME AXETIL 250 MG PO TABS: 250.0000 mg | ORAL_TABLET | Freq: Two times a day (BID) | ORAL | Status: DC

## 2014-11-30 MED ORDER — FENTANYL CITRATE (PF) 100 MCG/2ML IJ SOLN
100.0000 ug | INTRAMUSCULAR | Status: DC
  Administered 2014-11-30: 100 ug via INTRAVENOUS

## 2014-11-30 MED ORDER — ORPHENADRINE CITRATE 30 MG/ML IJ SOLN
INTRAMUSCULAR | Status: AC
Start: 2014-11-30 — End: 2014-11-30
  Administered 2014-11-30: 60 mg via INTRAMUSCULAR
  Filled 2014-11-30: qty 2

## 2014-11-30 MED ORDER — MIDAZOLAM HCL 5 MG/5ML IJ SOLN
INTRAMUSCULAR | Status: AC
  Administered 2014-11-30: 4 mg via INTRAVENOUS
  Filled 2014-11-30: qty 5

## 2014-11-30 MED ORDER — TRIAMCINOLONE ACETONIDE 40 MG/ML IJ SUSP
INTRAMUSCULAR | Status: AC
  Administered 2014-11-30: 40 mg
  Filled 2014-11-30: qty 1

## 2014-11-30 MED ORDER — BUPIVACAINE HCL (PF) 0.25 % IJ SOLN
INTRAMUSCULAR | Status: AC
  Administered 2014-11-30: 30 mL
  Filled 2014-11-30: qty 30

## 2014-11-30 MED ORDER — MIDAZOLAM HCL 5 MG/5ML IJ SOLN
5.0000 mg | INTRAMUSCULAR | Status: DC
  Administered 2014-11-30: 4 mg via INTRAVENOUS

## 2014-11-30 MED ORDER — TRIAMCINOLONE ACETONIDE 40 MG/ML IJ SUSP: 40.0000 mg | Freq: Once | INTRAMUSCULAR | Status: DC

## 2014-11-30 MED ORDER — FENTANYL CITRATE (PF) 100 MCG/2ML IJ SOLN
INTRAMUSCULAR | Status: AC
  Administered 2014-11-30: 100 ug via INTRAVENOUS
  Filled 2014-11-30: qty 2

## 2014-11-30 MED ORDER — ORPHENADRINE CITRATE 30 MG/ML IJ SOLN
60.0000 mg | Freq: Once | INTRAMUSCULAR | Status: AC
  Administered 2014-11-30: 60 mg via INTRAMUSCULAR

## 2014-11-30 NOTE — Progress Notes (Signed)
Subjective:    [name], female    DOB: 26-Jun-1955, 59 y.o.   MRN: [id number]  HPI  PROCEDURE:  Block of nerves to the sacroiliac joint.   NOTE:  The patient is a 59 y.o. female who returns to the [place] for further evaluation and treatment of pain involving the lower back and lower extremity region with pain in the region of the buttocks as well. Prior MRI studies reveal degenerative disc disease lumbar spine with shallow rightward disc protrusion with mild right subarticular narrowing. Mild facet hypertrophy L5-S1 without significant foraminal stenosis.  The patient is with positive Patrick's maneuver and is with severe tenderness to palpation over the PSIS and PII S regions  There is concern regarding a significant component of the patient's pain being due to sacroiliac joint dysfunction The risks, benefits, expectations of the procedure have been discussed and explained to the patient who is understanding and willing to proceed with interventional treatment in attempt to decrease severity of patient's symptoms, minimize the risk of medication escalation and  hopefully retard the progression of the patient's symptoms. We will proceed with what is felt to be a medically necessary procedure, block of nerves to the sacroiliac joint.   DESCRIPTION OF PROCEDURE:  Block of nerves to the sacroiliac joint.   The patient was taken to the fluoroscopy suite. With the patient in the prone position with EKG, blood pressure, pulse and pulse oximetry monitoring, IV Versed, IV fentanyl conscious sedation, Betadine prep of proposed entry site was performed.   Block of nerves at the L5 vertebral body level.   With the patient in prone position, under fluoroscopic guidance, a 22 -gauge needle was inserted at the L5 vertebral body level on the left side. With 15 degrees oblique orientation a 22 -gauge needle was inserted in the region known as Burton's eye or eye of the  Scotty dog. Following documentation of needle placement in the area of Burton's eye or eye of the Scotty dog under fluoroscopic guidance, needle placement was then ac left complished at the sacral ala level on the left side.   Needle placement at the sacral ala.   With the patient in prone position under fluoroscopic guidance with AP view of the lumbosacral spine, a 22 -gauge needle was inserted in the region known as the sacral ala on the left side. Following documentation of needle placement on the left side under fluoroscopic guidance needle placement was then accomplished at the S1 foramen level.   Needle placement at the S1 foramen level.   With the patient in prone position under fluoroscopic guidance with AP view of the lumbosacral spine and cephalad orientation, a 22 -gauge needle was inserted at the superior and lateral border of the S1 foramen on the left side. Following documentation of needle placement at the S1 foramen level on the left side, needle placement was then accomplished at the S2 foramen level on the left side.   Needle placement at the S2 foramen level.   With the patient in prone position with AP view of the lumbosacral spine with cephalad orientation, a 22 - gauge needle was inserted at the superior and lateral border of the S2 foramen under fluoroscopic guidance on the left side. Following needle placement at the L5 vertebral body level, sacral ala, S1 foramen and S2 foramen on the left side, needle placement was verified on lateral view under fluoroscopic guidance.  Following needle placement documentation on lateral view, each needle  was injected with 1 mL of 0.25% bupivacaine and Kenalog.   BLOCK OF THE NERVES TO SACROILIAC JOINT ON THE RIGHT SIDE The procedure was performed on the right side at the same levels as was performed on the left side and utilizing the same technique as on the left side and was performed under fluoroscopic guidance as on the left  side  Myoneural block injections of the lumbar paraspinal musculature region  Following Betadine prep of proposed entry site a 22-gauge needle was inserted in the lumbar paraspinal musculature region following negative aspiration 2 cc of 0.25% bupivacaine with Norflex was injected for myoneural block injection 4  The patient tolerated the procedure well  A total of 56m of Kenalog was utilized for the procedure.   PLAN:  1. Medications: The patient will continue presently prescribed medications. Hydrocodone acetaminophen  2. The patient will be considered for modification of treatment regimen pending response to the procedure performed on today's visit.  3. The patient is to follow-up with primary care physician Dr. GIona Beardor evaluation of blood pressure and general medical condition following the procedure performed on today's visit.  4. Surgical evaluation as discussed.  5. Neurological evaluation as discussed.  6. The patient may be a candidate for radiofrequency procedures, implantation devices and other treatment pending response to treatment performed on today's visit and follow-up evaluation.  7. The patient has been advised to adhere to proper body mechanics and to avoid activities which may exacerbate the patient's symptoms.   Return appointment to Pain Management Center as scheduled.    Review of Systems     Objective:   Physical Exam        Assessment & Plan:

## 2014-11-30 NOTE — Progress Notes (Signed)
Safety precautions to be maintained throughout the outpatient stay will include: orient to surroundings, keep bed in low position, maintain call bell within reach at all times, provide assistance with transfer out of bed and ambulation.  

## 2014-11-30 NOTE — Patient Instructions (Addendum)
PLAN  Continue present medication hydrocodone acetaminophen and begin taking antibiotic Ceftin as prescribed. Please obtain your antibiotic Ceftin today and begin taking antibiotic today  F/U PCP Dr. Iona Beard for evaliation of  BP and general medical  condition. Blood pressure low on today evaluation. Please follow-up with Dr. Iona Beard as discussed  F/U surgical evaluation. May consider pending follow-up evaluations  F/U neurological evaluation. May consider pending follow-up evaluations  May consider radiofrequency rhizolysis or intraspinal procedures pending response to present treatment and F/U evaluation.  Patient to call [facility] should patient have concerns prior to scheduled return appointment.  Pain Management Discharge Instructions  General Discharge Instructions :  If you need to reach your doctor call: Monday-Friday 8:00 am - 4:00 pm at [phone] or toll free [phone].  After clinic hours [phone] to have operator reach doctor.  Bring all of your medication bottles to all your appointments in the pain clinic.  To cancel or reschedule your appointment with Pain Management please remember to call 24 hours in advance to avoid a fee.  Refer to the educational materials which you have been given on: General Risks, I had my Procedure. Discharge Instructions, Post Sedation.  Post Procedure Instructions:  The drugs you were given will stay in your system until tomorrow, so for the next 24 hours you should not drive, make any legal decisions or drink any alcoholic beverages.  You may eat anything you prefer, but it is better to start with liquids then soups and crackers, and gradually work up to solid foods.  Please notify your doctor immediately if you have any unusual bleeding, trouble breathing or pain that is not related to your normal pain.  Depending on the type of procedure that was done, some parts of your body may feel week and/or numb.  This usually  clears up by tonight or the next day.  Walk with the use of an assistive device or accompanied by an adult for the 24 hours.  You may use ice on the affected area for the first 24 hours.  Put ice in a Ziploc bag and cover with a towel and place against area 15 minutes on 15 minutes off.  You may switch to heat after 24 hours.Selective Nerve Root Block Patient Information  Description: Specific nerve roots exit the spinal canal and these nerves can be compressed and inflamed by a bulging disc and bone spurs.  By injecting steroids on the nerve root, we can potentially decrease the inflammation surrounding these nerves, which often leads to decreased pain.  Also, by injecting local anesthesia on the nerve root, this can provide [place] helpful information to give to your referring doctor if it decreases your pain.  Selective nerve root blocks can be done along the spine from the neck to the low back depending on the location of your pain.   After numbing the skin with local anesthesia, a small needle is passed to the nerve root and the position of the needle is verified using x-ray pictures.  After the needle is in correct position, we then deposit the medication.  You may experience a pressure sensation while this is being done.  The entire block usually lasts less than 15 minutes.  Conditions that may be treated with selective nerve root blocks:  Low back and leg pain  Spinal stenosis  Diagnostic block prior to potential surgery  Neck and arm pain  Post laminectomy syndrome  Preparation for the injection:  1. Do not eat any solid  food or dairy products within 6 hours of your appointment. 2. You may drink clear liquids up to 2 hours before an appointment.  Clear liquids include water, black coffee, juice or soda.  No milk or cream please. 3. You may take your regular medications, including pain medications, with a sip of water before your appointment.  Diabetics should hold regular insulin (if  taken separately) and take 1/2 normal NPH dose the morning of the procedure.  Carry some sugar containing items with you to your appointment. 4. A driver must accompany you and be prepared to drive you home after your procedure. 5. Bring all your current medications with you. 6. An IV may be inserted and sedation may be given at the discretion of the physician. 7. A blood pressure cuff, EKG, and other monitors will often be applied during the procedure.  Some patients may need to have extra oxygen administered for a short period. 8. You will be asked to provide medical information, including allergies, prior to the procedure.  We must know immediately if you are taking blood  Thinners (like Coumadin) or if you are allergic to IV iodine contrast (dye).  Possible side-effects: All are usually temporary  Bleeding from needle site  Light headedness  Numbness and tingling  Decreased blood pressure  Weakness in arms/legs  Pressure sensation in back/neck  Pain at injection site (several days)  Possible complications: All are extremely rare  Infection  Nerve injury  Spinal headache (a headache wore with upright position)  Call if you experience:  Fever/chills associated with headache or increased back/neck pain  Headache worsened by an upright position  New onset weakness or numbness of an extremity below the injection site  Hives or difficulty breathing (go to the emergency room)  Inflammation or drainage at the injection site(s)  Severe back/neck pain greater than usual  New symptoms which are concerning to you  Please note:  Although the local anesthetic injected can often make your back or neck feel good for several hours after the injection the pain will likely return.  It takes 3-5 days for steroids to work on the nerve root. You may not notice any pain relief for at least one week.  If effective, we will often do a series of 3 injections spaced 3-6 weeks apart  to maximally decrease your pain.    If you have any questions, please call [phone] [facility]

## 2014-12-01 ENCOUNTER — Ambulatory Visit: Payer: PRIVATE HEALTH INSURANCE | Admitting: Pain Medicine

## 2014-12-01 ENCOUNTER — Telehealth: Payer: Self-pay

## 2014-12-01 NOTE — Telephone Encounter (Signed)
Patient states she is doing fine.   

## 2014-12-26 ENCOUNTER — Telehealth: Payer: Self-pay | Admitting: Pain Medicine

## 2014-12-26 NOTE — Telephone Encounter (Signed)
Ms. Arp called on Mon at 11:00 / her father is in hospital and not expected to live / she willl call back to resched

## 2014-12-27 ENCOUNTER — Ambulatory Visit: Payer: PRIVATE HEALTH INSURANCE | Admitting: Pain Medicine

## 2014-12-28 ENCOUNTER — Encounter: Payer: Self-pay | Admitting: *Deleted

## 2014-12-28 DIAGNOSIS — J189 Pneumonia, unspecified organism: Secondary | ICD-10-CM | POA: Insufficient documentation

## 2014-12-29 ENCOUNTER — Ambulatory Visit (INDEPENDENT_AMBULATORY_CARE_PROVIDER_SITE_OTHER): Payer: PRIVATE HEALTH INSURANCE | Admitting: Obstetrics and Gynecology

## 2014-12-29 ENCOUNTER — Encounter: Payer: Self-pay | Admitting: Obstetrics and Gynecology

## 2014-12-29 VITALS — BP 121/79 | HR 93 | Resp 18 | Ht 65.0 in | Wt 173.3 lb

## 2014-12-29 DIAGNOSIS — R350 Frequency of micturition: Secondary | ICD-10-CM | POA: Diagnosis not present

## 2014-12-29 DIAGNOSIS — R3129 Other microscopic hematuria: Secondary | ICD-10-CM | POA: Diagnosis not present

## 2014-12-29 LAB — MICROSCOPIC EXAMINATION
Bacteria, UA: NONE SEEN
EPITHELIAL CELLS (NON RENAL): NONE SEEN /HPF (ref 0–10)
RBC, UA: NONE SEEN /hpf (ref 0–?)
Renal Epithel, UA: NONE SEEN /hpf

## 2014-12-29 LAB — URINALYSIS, COMPLETE
BILIRUBIN UA: NEGATIVE
Glucose, UA: NEGATIVE
Ketones, UA: NEGATIVE
NITRITE UA: NEGATIVE
PH UA: 6 (ref 5.0–7.5)
PROTEIN UA: NEGATIVE
RBC UA: NEGATIVE
Specific Gravity, UA: 1.02 (ref 1.005–1.030)
UUROB: 0.2 mg/dL (ref 0.2–1.0)

## 2014-12-29 LAB — BLADDER SCAN AMB NON-IMAGING

## 2014-12-29 NOTE — Progress Notes (Signed)
12/29/2014 9:06 AM   Geral Ronney Lion 09-18-1955 [id number]  Referring provider: Sharyne Peach, MD [place], [place] [address]  Chief Complaint  Patient presents with  . Hematuria    HPI: Patient is a 59 year old female with a history of urinary frequency, gross hematuria and vaginal atrophy. She presents today for 6 month follow-up.  Negative hematuria workup completed 07/01/14 including CT urogram and cystoscopy.  She denies any further episodes of gross hematuria since her last visit.  She is currently taking daily Myrbetriq for management of her urinary frequency. She reports that she is tolerating the medication well. She has noticed some slight increase in frequency and states that she occasionally feels the urge to void and is unable to. She does admit to only drinking a few sips of liquids per day. She states that she has no desire to drink.  She has previously being treated for her vaginal atrophy with vaginal estrogen cream. She states that her dysuria completely resolved after using the cream that she has dense discontinue the medication. She does not desire to restart it at this time.  PMH: Past Medical History  Diagnosis Date  . Arthritis   . Lung abnormality     "damaged lung due to pneumonia"  . H. pylori infection   . HTN (hypertension)   . Pneumonia   . Heart murmur   . Pneumothorax   . Scoliosis   . Neck pain   . Kidney failure   . Nephrolithiasis   . Anxiety   . Acid reflux   . Urinary frequency   . Vaginal atrophy   . Gross hematuria     Surgical History: Past Surgical History  Procedure Laterality Date  . Appendectomy    . Abdominal hysterectomy      vaginal  . Neck surgery      Home Medications:    Medication List       This list is accurate as of: 12/29/14  9:06 AM.  Always use your most recent med list.               albuterol-ipratropium 18-103 MCG/ACT inhaler  Commonly known as:  COMBIVENT  Inhale into  the lungs every 4 (four) hours as needed for wheezing or shortness of breath.     amitriptyline 50 MG tablet  Commonly known as:  ELAVIL  Take 50 mg by mouth at bedtime.     cefUROXime 250 MG tablet  Commonly known as:  CEFTIN  Take 1 tablet (250 mg total) by mouth 2 (two) times daily with a meal.     CENTRUM ADULTS PO  Take by mouth.     cetirizine 10 MG tablet  Commonly known as:  ZYRTEC  Take 10 mg by mouth daily.     docusate calcium 240 MG capsule  Commonly known as:  SURFAK  Take 240 mg by mouth QID.     fluticasone 50 MCG/ACT nasal spray  Commonly known as:  FLONASE  Place 2 sprays into both nostrils daily.     HYDROcodone-acetaminophen 7.5-325 MG tablet  Commonly known as:  NORCO  Limit one tab by mouth per day or twice per day if tolerated     mirabegron ER 25 MG Tb24 tablet  Commonly known as:  MYRBETRIQ  Take 25 mg by mouth daily.     mometasone 220 MCG/INH inhaler  Commonly known as:  ASMANEX  Inhale 2 puffs into the lungs every 4 (four) hours as needed.  montelukast 10 MG tablet  Commonly known as:  SINGULAIR  Take 10 mg by mouth daily.     pantoprazole 40 MG tablet  Commonly known as:  PROTONIX  Take 40 mg by mouth.     promethazine 25 MG tablet  Commonly known as:  PHENERGAN  Take 25 mg by mouth every 6 (six) hours as needed for nausea or vomiting.     tiZANidine 4 MG tablet  Commonly known as:  ZANAFLEX  Take 4 mg by mouth 3 (three) times daily.     traZODone 100 MG tablet  Commonly known as:  DESYREL  Take 100 mg by mouth at bedtime.     vitamin C 500 MG tablet  Commonly known as:  ASCORBIC ACID  Take 500 mg by mouth daily.        Allergies:  Allergies  Allergen Reactions  . Grapefruit Bioflavonoid Complex Rash  . Propoxyphene Nausea And Vomiting and Nausea Only    Family History: Family History  Problem Relation Age of Onset  . Arthritis Mother   . Asthma Mother   . Hyperlipidemia Mother   . Hypertension Mother   .  Diabetes Sister   . Cancer Maternal Aunt     esophagus    Social History:  reports that she has never smoked. She does not have any smokeless tobacco history on file. She reports that she does not drink alcohol or use illicit drugs.  ROS: UROLOGY Frequent Urination?: No Hard to postpone urination?: No Burning/pain with urination?: No Get up at night to urinate?: No Leakage of urine?: No Urine stream starts and stops?: Yes Trouble starting stream?: No Do you have to strain to urinate?: No Blood in urine?: No Urinary tract infection?: No Sexually transmitted disease?: No Injury to kidneys or bladder?: No Painful intercourse?: No Weak stream?: No Currently pregnant?: No Vaginal bleeding?: No Last menstrual period?: n  Gastrointestinal Nausea?: No Vomiting?: No Indigestion/heartburn?: No Diarrhea?: No Constipation?: No  Constitutional Fever: No Night sweats?: No Weight loss?: No Fatigue?: No  Skin Skin rash/lesions?: No Itching?: No  Eyes Blurred vision?: No Double vision?: No  Ears/Nose/Throat Sore throat?: No Sinus problems?: Yes  Hematologic/Lymphatic Swollen glands?: No Easy bruising?: No  Cardiovascular Leg swelling?: No Chest pain?: No  Respiratory Cough?: No Shortness of breath?: No  Endocrine Excessive thirst?: No  Musculoskeletal Back pain?: No Joint pain?: No  Neurological Headaches?: Yes Dizziness?: No  Psychologic Depression?: No Anxiety?: No  Physical Exam: BP 121/79 mmHg  Pulse 93  Resp 18  Ht 5' 5"  (1.651 m)  Wt 173 lb 4.8 oz (78.608 kg)  BMI 28.84 kg/m2  Constitutional:  Alert and oriented, No acute distress. HEENT: [place] AT, moist mucus membranes.  Trachea midline, no masses. Cardiovascular: No clubbing, cyanosis, or edema. Respiratory: Normal respiratory effort, no increased work of breathing. Skin: No rashes, bruises or suspicious lesions. Lymph: No cervical or inguinal adenopathy. Neurologic: Grossly intact, no  focal deficits, moving all 4 extremities. Psychiatric: Normal mood and affect.  Laboratory Data:   Urinalysis    Component Value Date/Time   COLORURINE Straw 06/27/2014 1414   APPEARANCEUR Clear 06/27/2014 1414   LABSPEC 1.009 06/27/2014 1414   PHURINE 7.0 06/27/2014 1414   GLUCOSEU Negative 06/27/2014 1414   HGBUR Negative 06/27/2014 1414   BILIRUBINUR Negative 06/27/2014 1414   KETONESUR Negative 06/27/2014 1414   PROTEINUR Negative 06/27/2014 1414   NITRITE Negative 06/27/2014 1414   LEUKOCYTESUR Negative 06/27/2014 1414    Pertinent Imaging:  Assessment & Plan:    1. Hematuria- No further gross hematuria. Negative hematuria workup completed on 07/01/14. No microscopic hematuria noted on today's UA. - Urinalysis, Complete  2. Urinary Frequency- PVR 82m. patient's symptoms previously well managed on Myrbetriq. Recent slight increase in frequency. PVR within normal limits today. I suspect the patient's recent urinary symptoms are related to an adequate fluid intake. I encouraged her to increase her daily water intake significantly. We will see her in 6 months to recheck her symptoms.  3. Vaginal Atrophy- Previously treated with vaginal skin cream. Patient is asymptomatic at this time.  Return in about 6 months (around 06/29/2015).  These notes generated with voice recognition software. I apologize for typographical errors.  LHerbert Moors [facility] [address] [employer] [phone]

## 2015-01-13 ENCOUNTER — Emergency Department: Payer: PRIVATE HEALTH INSURANCE

## 2015-01-13 ENCOUNTER — Encounter: Payer: Self-pay | Admitting: Emergency Medicine

## 2015-01-13 ENCOUNTER — Emergency Department
Admission: EM | Admit: 2015-01-13 | Discharge: 2015-01-13 | Disposition: A | Discharge status: Home / Self Care | Payer: PRIVATE HEALTH INSURANCE | Attending: Emergency Medicine | Admitting: Emergency Medicine

## 2015-01-13 DIAGNOSIS — J209 Acute bronchitis, unspecified: Secondary | ICD-10-CM | POA: Diagnosis not present

## 2015-01-13 DIAGNOSIS — Z7951 Long term (current) use of inhaled steroids: Secondary | ICD-10-CM | POA: Insufficient documentation

## 2015-01-13 DIAGNOSIS — Z79899 Other long term (current) drug therapy: Secondary | ICD-10-CM | POA: Diagnosis not present

## 2015-01-13 DIAGNOSIS — Z792 Long term (current) use of antibiotics: Secondary | ICD-10-CM | POA: Insufficient documentation

## 2015-01-13 DIAGNOSIS — R05 Cough: Secondary | ICD-10-CM | POA: Diagnosis present

## 2015-01-13 DIAGNOSIS — J40 Bronchitis, not specified as acute or chronic: Secondary | ICD-10-CM

## 2015-01-13 LAB — BASIC METABOLIC PANEL
Anion gap: 5 (ref 5–15)
BUN: 14 mg/dL (ref 6–20)
CALCIUM: 8.7 mg/dL — AB (ref 8.9–10.3)
CO2: 29 mmol/L (ref 22–32)
Chloride: 104 mmol/L (ref 101–111)
Creatinine, Ser: 0.81 mg/dL (ref 0.44–1.00)
GFR calc Af Amer: >60 mL/min (ref >60)
GLUCOSE: 102 mg/dL — AB (ref 65–99)
Potassium: 4.1 mmol/L (ref 3.5–5.1)
Sodium: 138 mmol/L (ref 135–145)

## 2015-01-13 LAB — CBC
HCT: 35.5 % (ref 35.0–47.0)
Hemoglobin: 11.6 g/dL — ABNORMAL LOW (ref 12.0–16.0)
MCH: 29.6 pg (ref 26.0–34.0)
MCHC: 32.6 g/dL (ref 32.0–36.0)
MCV: 90.6 fL (ref 80.0–100.0)
Platelets: 396 10*3/uL (ref 150–440)
RBC: 3.92 MIL/uL (ref 3.80–5.20)
RDW: 13.2 % (ref 11.5–14.5)
WBC: 10 10*3/uL (ref 3.6–11.0)

## 2015-01-13 LAB — TROPONIN I

## 2015-01-13 MED ORDER — IPRATROPIUM-ALBUTEROL 0.5-2.5 (3) MG/3ML IN SOLN
3.0000 mL | Freq: Once | RESPIRATORY_TRACT | Status: AC
  Administered 2015-01-13: 3 mL via RESPIRATORY_TRACT
  Filled 2015-01-13: qty 3

## 2015-01-13 MED ORDER — ACETAMINOPHEN 500 MG PO TABS
1000.0000 mg | ORAL_TABLET | Freq: Once | ORAL | Status: AC
  Administered 2015-01-13: 1000 mg via ORAL
  Filled 2015-01-13: qty 2

## 2015-01-13 MED ORDER — LEVOFLOXACIN 750 MG PO TABS: 750.0000 mg | ORAL_TABLET | Freq: Every day | ORAL | Status: AC

## 2015-01-13 MED ORDER — IPRATROPIUM-ALBUTEROL 0.5-2.5 (3) MG/3ML IN SOLN: 3.0000 mL | RESPIRATORY_TRACT | Status: DC | PRN

## 2015-01-13 NOTE — Discharge Instructions (Signed)
Use your albuterol inhaler every 4 hours--2 puffs. Take the antibiotic until finished. Schedule a follow up with the pulmonologist. Take the cough medicine every 12 hours as prescribed--this will also help with your headache. Take Tylenol 1070m every 4 hours as needed.

## 2015-01-13 NOTE — ED Notes (Signed)
Pt to ed with c/o cough, congestion x 3 weeks,  Pt states was seen by md this week and told she had bronchitis and fluid on her lungs.  Pt reports increasing sob.

## 2015-01-13 NOTE — ED Provider Notes (Signed)
ECG viewed and interpreted by myself, attending physician Dr. Reita Cliche  57bpm.  Sinus bradycardia.  Narrow qrs.  Normal axis.  Nonspecific t wave.  Lisa Roca, MD 01/13/15 1524

## 2015-01-13 NOTE — ED Notes (Signed)
Patient signed discharge instructions. Computer froze on screen. Unable to find signature at this time.

## 2015-01-13 NOTE — ED Provider Notes (Signed)
[facility] Emergency Department Provider Note  ____________________________________________  Time seen: Approximately 1:48 PM  I have reviewed the triage vital signs and the nursing notes.   HISTORY  Chief Complaint Cough and Nasal Congestion   HPI Rachael Jordan is a 59 y.o. female who presents to the emergency department for evaluation of cough and chest congestion for the past 3 weeks. She has been evaluated by her PCP. She finished her antibiotic. She is still taking her prednisone, albuterol, and cough medication but continues to have dyspnea and cough.   Past Medical History  Diagnosis Date  . Arthritis   . Lung abnormality     "damaged lung due to pneumonia"  . H. pylori infection   . Pneumonia   . Heart murmur   . Pneumothorax   . Scoliosis   . Neck pain   . Kidney failure   . Nephrolithiasis   . Anxiety   . Acid reflux   . Urinary frequency   . Vaginal atrophy   . Gross hematuria     Patient Active Problem List   Diagnosis Date Noted  . PNA (pneumonia) 12/28/2014  . DDD (degenerative disc disease), lumbar 08/15/2014  . Lumbar radicular syndrome 08/15/2014  . 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 DDD (degenerative disc disease), cervical 08/15/2014  . Bilateral occipital neuralgia 08/15/2014  . Migraine 08/15/2014  . Sacroiliac joint dysfunction 08/15/2014  . Back pain, chronic 09/23/2013  . Chronic cervical pain 09/23/2013  . Arthralgia of temporomandibular joint 08/29/2013  . Absolute anemia 08/15/2013  . Chronic kidney disease (CKD), stage III (moderate) 08/15/2013  . Abnormal serum level of alkaline phosphatase 08/15/2013  . Blush 08/15/2013  . Hematuria, microscopic 08/15/2013  . Inflamed nasal mucosa 08/15/2013  . Big thyroid 08/15/2013  . Gastrointestinal ulcer due to Helicobacter pylori 22/04/5425  . Kidney failure 08/09/2013  . SIRS (systemic inflammatory response  syndrome) ([place]) 08/09/2013    Past Surgical History  Procedure Laterality Date  . Appendectomy    . Abdominal hysterectomy      vaginal  . Neck surgery      Current Outpatient Rx  Name  Route  Sig  Dispense  Refill  . albuterol-ipratropium (COMBIVENT) 18-103 MCG/ACT inhaler   Inhalation   Inhale into the lungs every 4 (four) hours as needed for wheezing or shortness of breath.         Marland Kitchen amitriptyline (ELAVIL) 50 MG tablet   Oral   Take 50 mg by mouth at bedtime.         . cefUROXime (CEFTIN) 250 MG tablet   Oral   Take 1 tablet (250 mg total) by mouth 2 (two) times daily with a meal. Patient not taking: Reported on 11/02/2014   14 tablet   0   . cetirizine (ZYRTEC) 10 MG tablet   Oral   Take 10 mg by mouth daily.         Marland Kitchen docusate calcium (SURFAK) 240 MG capsule   Oral   Take 240 mg by mouth QID.         . fluticasone (FLONASE) 50 MCG/ACT nasal spray   Each Nare   Place 2 sprays into both nostrils daily.         Marland Kitchen HYDROcodone-acetaminophen (NORCO) 7.5-325 MG per tablet      Limit one tab by mouth per day or twice per day if tolerated   60 tablet   0   . mirabegron ER (MYRBETRIQ)  25 MG TB24 tablet   Oral   Take 25 mg by mouth daily.         . mometasone (ASMANEX) 220 MCG/INH inhaler   Inhalation   Inhale 2 puffs into the lungs every 4 (four) hours as needed.         . montelukast (SINGULAIR) 10 MG tablet   Oral   Take 10 mg by mouth daily.         . Multiple Vitamins-Minerals (CENTRUM ADULTS PO)   Oral   Take by mouth.         . pantoprazole (PROTONIX) 40 MG tablet   Oral   Take 40 mg by mouth.         . promethazine (PHENERGAN) 25 MG tablet   Oral   Take 25 mg by mouth every 6 (six) hours as needed for nausea or vomiting.         Marland Kitchen tiZANidine (ZANAFLEX) 4 MG tablet   Oral   Take 4 mg by mouth 3 (three) times daily.           Dispense as written.   . traZODone (DESYREL) 100 MG tablet   Oral   Take 100 mg by mouth at  bedtime.         . vitamin C (ASCORBIC ACID) 500 MG tablet   Oral   Take 500 mg by mouth daily.           Allergies Grapefruit bioflavonoid complex and Propoxyphene  Family History  Problem Relation Age of Onset  . Arthritis Mother   . Asthma Mother   . Hyperlipidemia Mother   . Hypertension Mother   . Diabetes Sister   . Cancer Maternal Aunt     esophagus    Social History Social History  Substance Use Topics  . Smoking status: Never Smoker   . Smokeless tobacco: None  . Alcohol Use: No    Review of Systems Constitutional: No fever/chills Eyes: No visual changes. ENT: No sore throat. Cardiovascular: Denies chest pain. Respiratory: Denies shortness of breath. Gastrointestinal: No abdominal pain.  No nausea, no vomiting.  No diarrhea.  No constipation. Genitourinary: Negative for dysuria. Musculoskeletal: Negative for back pain. Skin: Negative for rash. Neurological: Negative for headaches, focal weakness or numbness.  10-point ROS otherwise negative.  ____________________________________________   PHYSICAL EXAM:  VITAL SIGNS: ED Triage Vitals  Enc Vitals Group     BP --      Pulse Rate 01/13/15 1121 103     Resp 01/13/15 1121 22     Temp 01/13/15 1121 98.4 F (36.9 C)     Temp Source 01/13/15 1121 Oral     SpO2 01/13/15 1121 96 %     Weight 01/13/15 1121 170 lb (77.111 kg)     Height 01/13/15 1121 5' 5"  (1.651 m)     Head Cir --      Peak Flow --      Pain Score 01/13/15 1122 8     Pain Loc --      Pain Edu? --      Excl. in [place]? --     Constitutional: Alert and oriented. Well appearing and in no acute distress. Eyes: Conjunctivae are normal. PERRL. EOMI. Head: Atraumatic. Nose: No congestion/rhinnorhea. Mouth/Throat: Mucous membranes are moist.  Oropharynx non-erythematous. Neck: No stridor.   Cardiovascular: Normal rate, regular rhythm. Grossly normal heart sounds.  Good peripheral circulation. Respiratory: Normal respiratory effort.   No retractions. Rhonchi noted in bilateral bases.  Gastrointestinal: Soft and nontender. No distention. No abdominal bruits. No CVA tenderness. Musculoskeletal: No lower extremity tenderness nor edema.  No joint effusions. Neurologic:  Normal speech and language. No gross focal neurologic deficits are appreciated. No gait instability. Skin:  Skin is warm, dry and intact. No rash noted. Psychiatric: Mood and affect are normal. Speech and behavior are normal.  ____________________________________________   LABS (all labs ordered are listed, but only abnormal results are displayed)  Labs Reviewed  BASIC METABOLIC PANEL - Abnormal; Notable for the following:    Glucose, Bld 102 (*)    Calcium 8.7 (*)    All other components within normal limits  CBC - Abnormal; Notable for the following:    Hemoglobin 11.6 (*)    All other components within normal limits  TROPONIN I   ____________________________________________  EKG  Sinus tachycardia; Normal axis, nonspecific T wave abnormality without STEMI ____________________________________________  RADIOLOGY  Chest xray shows improved atelectasis compared to the previous. No edema, consolidation, effusion, or pneumothorax per radiologist. Awanda Mink B lines noted.  I, Sherrie George, personally viewed and evaluated these images (plain radiographs) as part of my medical decision making.   ____________________________________________   PROCEDURES  Procedure(s) performed: None  Critical Care performed: No  ____________________________________________   INITIAL IMPRESSION / ASSESSMENT AND PLAN / ED COURSE  Pertinent labs & imaging results that were available during my care of the patient were reviewed by me and considered in my medical decision making (see chart for details).  Ambulatory saturation 96-100% on room air.  Patient will be discharged home with Rx for Levaquin, ipratroprium bromide-albuterol for the nebulizer. She was  instructed to call this afternoon to schedule an appointment with her pulmonologist, Dr. Humphrey Rolls. She was advised to return to the emergency department immediately for symptoms that change or worsen over the weekend.  ____________________________________________   FINAL CLINICAL IMPRESSION(S) / ED DIAGNOSES  Final diagnoses:  None      Victorino Dike, FNP 01/13/15 1514  Nance Pear, MD 01/14/15 0710

## 2015-01-17 DIAGNOSIS — M7061 Trochanteric bursitis, right hip: Secondary | ICD-10-CM | POA: Insufficient documentation

## 2015-01-17 DIAGNOSIS — M545 Low back pain, unspecified: Secondary | ICD-10-CM | POA: Insufficient documentation

## 2015-02-03 ENCOUNTER — Telehealth: Payer: Self-pay | Admitting: Pain Medicine

## 2015-02-03 NOTE — Telephone Encounter (Signed)
[name] has been unable to come in for med refill / bronchitis and then father has passed /  Could she pick up a month of meds and come for appt sched for Dec ?

## 2015-02-03 NOTE — Telephone Encounter (Signed)
Spoke with Dr. Primus Bravo, pt must come for appt, Tue at 0700. Pt notified. Please schedule pt for Tue Nov  22 at 0700

## 2015-02-07 ENCOUNTER — Encounter: Payer: Self-pay | Admitting: Pain Medicine

## 2015-02-07 ENCOUNTER — Ambulatory Visit: Payer: PRIVATE HEALTH INSURANCE | Attending: Pain Medicine | Admitting: Pain Medicine

## 2015-02-07 VITALS — BP 111/66 | HR 92 | Temp 98.1°F | Resp 16 | Wt 173.0 lb

## 2015-02-07 DIAGNOSIS — M546 Pain in thoracic spine: Secondary | ICD-10-CM | POA: Diagnosis present

## 2015-02-07 DIAGNOSIS — G43109 Migraine with aura, not intractable, without status migrainosus: Secondary | ICD-10-CM

## 2015-02-07 DIAGNOSIS — M5481 Occipital neuralgia: Secondary | ICD-10-CM | POA: Diagnosis not present

## 2015-02-07 DIAGNOSIS — M5030 Other cervical disc degeneration, unspecified cervical region: Secondary | ICD-10-CM

## 2015-02-07 DIAGNOSIS — M533 Sacrococcygeal disorders, not elsewhere classified: Secondary | ICD-10-CM | POA: Diagnosis not present

## 2015-02-07 DIAGNOSIS — M48062 Spinal stenosis, lumbar region with neurogenic claudication: Secondary | ICD-10-CM

## 2015-02-07 DIAGNOSIS — G43101 Migraine with aura, not intractable, with status migrainosus: Secondary | ICD-10-CM

## 2015-02-07 DIAGNOSIS — M5136 Other intervertebral disc degeneration, lumbar region: Secondary | ICD-10-CM | POA: Insufficient documentation

## 2015-02-07 DIAGNOSIS — M5416 Radiculopathy, lumbar region: Secondary | ICD-10-CM

## 2015-02-07 DIAGNOSIS — M5126 Other intervertebral disc displacement, lumbar region: Secondary | ICD-10-CM | POA: Insufficient documentation

## 2015-02-07 DIAGNOSIS — M542 Cervicalgia: Secondary | ICD-10-CM | POA: Diagnosis present

## 2015-02-07 MED ORDER — HYDROCODONE-ACETAMINOPHEN 7.5-325 MG PO TABS: ORAL_TABLET | ORAL | Status: DC

## 2015-02-07 NOTE — Patient Instructions (Signed)
Continue present medication hydrocodone acetaminophen  F/U PCP Dr. Iona Beard for evaliation of  BP and general medical  condition  F/U surgical evaluation  F/U neurological evaluation  May consider radiofrequency rhizolysis or intraspinal procedures pending response to present treatment and F/U evaluation   Patient to call Pain Management Center should patient have concerns prior to scheduled return appointment.

## 2015-02-07 NOTE — Progress Notes (Signed)
   Subjective:    [name], female    DOB: [birth year], 57 y.o.   MRN: [id number]  HPI  The patient is a 59 year old female who returns to pain management for further evaluation and treatment of pain involving the neck and back upper and lower extremity regions. Patient states that she has had improvement of lower back and lower extremity pain following interventional treatment performed in pain management Center. Patient recently lost her father and is assisting with family matters at this time. We will continue present medications and avoid interventional treatment. The patient denies any trauma or change in events of daily living the call significant change in symptomatology. Patient states that she has some increase of pain with twisting turning maneuvers and pain becomes more intense as the day progresses. Patient also has pain with turning over in bed. We will consider modifications of treatment pending response to present treatment and follow-up evaluation. The patient was understanding and agreement with suggested treatment plan   Review of Systems     Objective:   Physical Exam  There was tenderness over the splenius capitis and a separate talus musculature regions palpation which reproduces pain of mild degree. There was mild tenderness to palpation over the acromioclavicular and glenohumeral joint regions. Tinel and Phalen's maneuver were without significant increase of pain and patient appeared to be with bilaterally equal grip strength. There was tenderness to palpation over the thoracic facet thoracic paraspinal musculature region of mild degree. There was evidence of muscle spasms in the lower thoracic paraspinal musculature region. Palpation over the lumbar paraspinal musculatures and lumbar facet region was attends to palpation of moderate degree. Lateral bending rotation extension and palpation over the lumbar facets reproduced moderate discomfort. Straight  leg raising was tolerates approximately 20 without increased pain with dorsiflexion noted. There was moderate tenderness to palpation of the greater trochanteric region and iliotibial band region. There was moderate tends to palpation of the PSIS and PII S regions. No definite sensory deficit or dermatomal distribution was detected. There was negative clonus negative Homans. DTRs appeared to be trace at the knees. The abdomen was nontender and no costovertebral tenderness was noted.      Assessment & Plan:   Degenerative disc disease lumbar spine Shallow rightward disc protrusion with mild right subarticular narrowing. Mild facet hypertrophy L5-S1 without significant foraminal stenosis  Lumbar facet syndrome  Sacroiliac joint dysfunction  Bilateral occipital neuralgia    PLAN   Continue present medication hydrocodone acetaminophen  F/U PCP Dr. Iona Beard for evaliation of  BP and general medical  condition  F/U surgical evaluation  F/U neurological evaluation  May consider radiofrequency rhizolysis or intraspinal procedures pending response to present treatment and F/U evaluation   Patient to call Pain Management Center should patient have concerns prior to scheduled return appointment.

## 2015-02-16 ENCOUNTER — Telehealth: Payer: Self-pay | Admitting: Pain Medicine

## 2015-02-16 NOTE — Telephone Encounter (Signed)
Back is hurting and swollen / would like to come in for procedure ? Has eval appt 03-07-15

## 2015-02-16 NOTE — Telephone Encounter (Signed)
Juliann Pulse and Nurses Please call patient and described patient's pain to me Schedule patient for procedure for 02/20/2015 or 02/22/2015 provided insurance will approve patient for procedure At this time request approval for lumbar facet, medial branch nerve blocks Discuss patient's pain with me today so that we can change the procedure request if necessary to ensure insurance approval

## 2015-02-17 NOTE — Telephone Encounter (Signed)
Pain in lower back, buttocks, right leg, knee, and ankle. Also pain in left leg, with numbness.

## 2015-02-17 NOTE — Telephone Encounter (Signed)
Dr. Primus Bravo notified. Schedule patient for [facility] on 12/05 or 12/07

## 2015-02-22 ENCOUNTER — Encounter: Payer: Self-pay | Admitting: Pain Medicine

## 2015-02-22 ENCOUNTER — Ambulatory Visit: Payer: PRIVATE HEALTH INSURANCE | Attending: Pain Medicine | Admitting: Pain Medicine

## 2015-02-22 VITALS — BP 127/76 | HR 81 | Temp 98.2°F | Resp 16 | Ht 64.0 in | Wt 173.0 lb

## 2015-02-22 DIAGNOSIS — M79605 Pain in left leg: Secondary | ICD-10-CM | POA: Diagnosis present

## 2015-02-22 DIAGNOSIS — M545 Low back pain: Secondary | ICD-10-CM | POA: Insufficient documentation

## 2015-02-22 DIAGNOSIS — M5416 Radiculopathy, lumbar region: Secondary | ICD-10-CM

## 2015-02-22 DIAGNOSIS — M5136 Other intervertebral disc degeneration, lumbar region: Secondary | ICD-10-CM | POA: Diagnosis not present

## 2015-02-22 DIAGNOSIS — M48062 Spinal stenosis, lumbar region with neurogenic claudication: Secondary | ICD-10-CM

## 2015-02-22 DIAGNOSIS — M79604 Pain in right leg: Secondary | ICD-10-CM | POA: Diagnosis present

## 2015-02-22 DIAGNOSIS — M533 Sacrococcygeal disorders, not elsewhere classified: Secondary | ICD-10-CM

## 2015-02-22 DIAGNOSIS — M5481 Occipital neuralgia: Secondary | ICD-10-CM

## 2015-02-22 DIAGNOSIS — G43101 Migraine with aura, not intractable, with status migrainosus: Secondary | ICD-10-CM

## 2015-02-22 DIAGNOSIS — G43109 Migraine with aura, not intractable, without status migrainosus: Secondary | ICD-10-CM

## 2015-02-22 DIAGNOSIS — M5030 Other cervical disc degeneration, unspecified cervical region: Secondary | ICD-10-CM

## 2015-02-22 MED ORDER — SODIUM CHLORIDE 0.9 % IJ SOLN
20.0000 mL | Freq: Once | INTRAMUSCULAR | Status: AC
  Administered 2015-02-22: 4 mL

## 2015-02-22 MED ORDER — BUPIVACAINE HCL (PF) 0.25 % IJ SOLN
INTRAMUSCULAR | Status: AC
  Filled 2015-02-22: qty 30

## 2015-02-22 MED ORDER — FENTANYL CITRATE (PF) 100 MCG/2ML IJ SOLN
INTRAMUSCULAR | Status: AC
  Administered 2015-02-22: 50 ug via INTRAVENOUS
  Filled 2015-02-22: qty 2

## 2015-02-22 MED ORDER — FENTANYL CITRATE (PF) 100 MCG/2ML IJ SOLN
100.0000 ug | Freq: Once | INTRAMUSCULAR | Status: AC
  Administered 2015-02-22: 50 ug via INTRAVENOUS

## 2015-02-22 MED ORDER — BUPIVACAINE HCL (PF) 0.25 % IJ SOLN: 30.0000 mL | Freq: Once | INTRAMUSCULAR | Status: DC

## 2015-02-22 MED ORDER — LIDOCAINE HCL (PF) 1 % IJ SOLN
INTRAMUSCULAR | Status: AC
  Administered 2015-02-22: 5 mL via SUBCUTANEOUS
  Filled 2015-02-22: qty 10

## 2015-02-22 MED ORDER — MIDAZOLAM HCL 5 MG/5ML IJ SOLN
5.0000 mg | Freq: Once | INTRAMUSCULAR | Status: AC
  Administered 2015-02-22: 2 mg via INTRAVENOUS

## 2015-02-22 MED ORDER — TRIAMCINOLONE ACETONIDE 40 MG/ML IJ SUSP
40.0000 mg | Freq: Once | INTRAMUSCULAR | Status: AC
  Administered 2015-02-22: 40 mg

## 2015-02-22 MED ORDER — CEFAZOLIN SODIUM 1 G IJ SOLR
INTRAMUSCULAR | Status: AC
  Administered 2015-02-22: 1 g via INTRAVENOUS
  Filled 2015-02-22: qty 10

## 2015-02-22 MED ORDER — CEFAZOLIN SODIUM 1-5 GM-% IV SOLN: 1.0000 g | Freq: Once | INTRAVENOUS | Status: DC

## 2015-02-22 MED ORDER — CEFUROXIME AXETIL 250 MG PO TABS: 250.0000 mg | ORAL_TABLET | Freq: Two times a day (BID) | ORAL | Status: DC

## 2015-02-22 MED ORDER — LIDOCAINE HCL (PF) 1 % IJ SOLN
10.0000 mL | Freq: Once | INTRAMUSCULAR | Status: AC
  Administered 2015-02-22: 5 mL via SUBCUTANEOUS

## 2015-02-22 MED ORDER — LACTATED RINGERS IV SOLN: 1000.0000 mL | INTRAVENOUS | Status: DC

## 2015-02-22 MED ORDER — MIDAZOLAM HCL 5 MG/5ML IJ SOLN
INTRAMUSCULAR | Status: AC
  Administered 2015-02-22: 2 mg via INTRAVENOUS
  Filled 2015-02-22: qty 5

## 2015-02-22 MED ORDER — TRIAMCINOLONE ACETONIDE 40 MG/ML IJ SUSP
INTRAMUSCULAR | Status: AC
  Administered 2015-02-22: 40 mg
  Filled 2015-02-22: qty 1

## 2015-02-22 MED ORDER — ORPHENADRINE CITRATE 30 MG/ML IJ SOLN
INTRAMUSCULAR | Status: AC
  Filled 2015-02-22: qty 2

## 2015-02-22 MED ORDER — ORPHENADRINE CITRATE 30 MG/ML IJ SOLN: 60.0000 mg | Freq: Once | INTRAMUSCULAR | Status: DC

## 2015-02-22 MED ORDER — SODIUM CHLORIDE 0.9 % IJ SOLN
INTRAMUSCULAR | Status: AC
  Administered 2015-02-22: 4 mL
  Filled 2015-02-22: qty 20

## 2015-02-22 NOTE — Progress Notes (Signed)
   Subjective:    Patient ID: Nicoletta Dress, female    DOB: 1955/07/05, 59 y.o.   MRN: [id number]  HPI PROCEDURE PERFORMED: Lumbar epidural steroid injection   NOTE: The patient is a 59 y.o. female who returns to [facility] for further evaluation and treatment of pain involving the lumbar and lower extremity region. MRI revealed the patient to be with Degenerative disc disease lumbar spine Shallow rightward disc protrusion with mild right subarticular narrowing. Mild facet hypertrophy L5-S1 without significant foraminal stenosis. There is concern regarding patient being with evidence of lumbar stenosis with neurogenic claudication and lumbar radiculopathy. The risks, benefits, and expectations of the procedure have been discussed and explained to the patient who was understanding and in agreement with suggested treatment plan. We will proceed with lumbar epidural steroid injection as discussed and as explained to the patient who is willing to proceed with procedure as planned.   DESCRIPTION OF PROCEDURE: Lumbar epidural steroid injection with IV Versed, IV fentanyl conscious sedation, EKG, blood pressure, pulse, and pulse oximetry monitoring. The procedure was performed with the patient in the prone position under fluoroscopic guidance. A local anesthetic skin wheal of 1.5% plain lidocaine was accomplished at proposed entry site. An 18-gauge Tuohy epidural needle was inserted at the L 4 vertebral body level right of the midline via loss-of-resistance technique with negative heme and negative CSF return. A total of 4 mL of Preservative-Free normal saline with 40 mg of Kenalog injected incrementally via epidurally placed needle. Needle was removed.    A total of 40 mg of Kenalog was utilized for the procedure.   The patient tolerated the injection well.    PLAN:   1. Medications: We will continue presently prescribed medication hydrocodone acetaminophen 2. Will consider  modification of treatment regimen pending response to treatment rendered on today's visit and follow-up evaluation. 3. The patient is to follow-up with primary care physician Dr. Iona Beard egarding blood pressure and general medical condition status post lumbar epidural steroid injection performed on today's visit. 4. Surgical evaluation. Surgical evaluation as planned  5. Neurological evaluation. May consider PN[facility] EMG studies 6. Patient may be candidate for radiofrequency procedures, implantation device, and other treatment pending response to treatment and follow-up evaluation. 7. The patient has been advised to adhere to proper body mechanics and avoid activities which appear to aggravate condition. 8. The patient has been advised to call the Pain Management Center prior to scheduled return appointment should there be significant change in condition or should there be sign  The patient is understanding and agrees with the suggested  treatment plan      Review of Systems     Objective:   Physical Exam        Assessment & Plan:

## 2015-02-22 NOTE — Progress Notes (Signed)
Safety precautions to be maintained throughout the outpatient stay will include: orient to surroundings, keep bed in low position, maintain call bell within reach at all times, provide assistance with transfer out of bed and ambulation.  

## 2015-02-22 NOTE — Patient Instructions (Addendum)
PLAN  Continue present medication hydrocodone acetaminophen and begin taking antibiotic Ceftin as prescribed. Please obtain your antibiotic Ceftin today and begin taking antibiotic today  F/U PCP Dr. Iona Beard for evaliation of  BP and general medical  condition.   F/U surgical evaluation. Surgical evaluation as discussed  F/U neurological evaluation. May consider pending follow-up evaluations  May consider radiofrequency rhizolysis or intraspinal procedures pending response to present treatment and F/U evaluation.  Patient to call Pain Management Center should patient have concerns prior to scheduled return appointment. Epidural Steroid Injection Patient Information  Description: The epidural space surrounds the nerves as they exit the spinal cord.  In some patients, the nerves can be compressed and inflamed by a bulging disc or a tight spinal canal (spinal stenosis).  By injecting steroids into the epidural space, we can bring irritated nerves into direct contact with a potentially helpful medication.  These steroids act directly on the irritated nerves and can reduce swelling and inflammation which often leads to decreased pain.  Epidural steroids may be injected anywhere along the spine and from the neck to the low back depending upon the location of your pain.   After numbing the skin with local anesthetic (like Novocaine), a small needle is passed into the epidural space slowly.  You may experience a sensation of pressure while this is being done.  The entire block usually last less than 10 minutes.  Conditions which may be treated by epidural steroids:   Low back and leg pain  Neck and arm pain  Spinal stenosis  Post-laminectomy syndrome  Herpes zoster (shingles) pain  Pain from compression fractures  Preparation for the injection:  1. Do not eat any solid food or dairy products within 6 hours of your appointment.  2. You may drink clear liquids up to 2 hours before  appointment.  Clear liquids include water, black coffee, juice or soda.  No milk or cream please. 3. You may take your regular medication, including pain medications, with a sip of water before your appointment  Diabetics should hold regular insulin (if taken separately) and take 1/2 normal NPH dos the morning of the procedure.  Carry some sugar containing items with you to your appointment. 4. A driver must accompany you and be prepared to drive you home after your procedure.  5. Bring all your current medications with your. 6. An IV may be inserted and sedation may be given at the discretion of the physician.   7. A blood pressure cuff, EKG and other monitors will often be applied during the procedure.  Some patients may need to have extra oxygen administered for a short period. 8. You will be asked to provide medical information, including your allergies, prior to the procedure.  We must know immediately if you are taking blood thinners (like Coumadin/Warfarin)  Or if you are allergic to IV iodine contrast (dye). We must know if you could possible be pregnant.  Possible side-effects:  Bleeding from needle site  Infection (rare, may require surgery)  Nerve injury (rare)  Numbness & tingling (temporary)  Difficulty urinating (rare, temporary)  Spinal headache ( a headache worse with upright posture)  Light -headedness (temporary)  Pain at injection site (several days)  Decreased blood pressure (temporary)  Weakness in arm/leg (temporary)  Pressure sensation in back/neck (temporary)  Call if you experience:  Fever/chills associated with headache or increased back/neck pain.  Headache worsened by an upright position.  New onset weakness or numbness of an extremity below the  injection site  Hives or difficulty breathing (go to the emergency room)  Inflammation or drainage at the infection site  Severe back/neck pain  Any new symptoms which are concerning to you  Please  note:  Although the local anesthetic injected can often make your back or neck feel good for several hours after the injection, the pain will likely return.  It takes 3-7 days for steroids to work in the epidural space.  You may not notice any pain relief for at least that one week.  If effective, we will often do a series of three injections spaced 3-6 weeks apart to maximally decrease your pain.  After the initial series, we generally will wait several months before considering a repeat injection of the same type.  If you have any questions, please call [phone] [facility]  General Discharge Instructions :  If you need to reach your doctor call: Monday-Friday 8:00 am - 4:00 pm at [phone] or toll free [phone].  After clinic hours [phone] to have operator reach doctor.  Bring all of your medication bottles to all your appointments in the pain clinic.  To cancel or reschedule your appointment with Pain Management please remember to call 24 hours in advance to avoid a fee.  Refer to the educational materials which you have been given on: General Risks, I had my Procedure. Discharge Instructions, Post Sedation.  Post Procedure Instructions:  The drugs you were given will stay in your system until tomorrow, so for the next 24 hours you should not drive, make any legal decisions or drink any alcoholic beverages.  You may eat anything you prefer, but it is better to start with liquids then soups and crackers, and gradually work up to solid foods.  Please notify your doctor immediately if you have any unusual bleeding, trouble breathing or pain that is not related to your normal pain.  Depending on the type of procedure that was done, some parts of your body may feel week and/or numb.  This usually clears up by tonight or the next day.  Walk with the use of an assistive device or accompanied by an  adult for the 24 hours.  You may use ice on the affected area for the first 24 hours.  Put ice in a Ziploc bag and cover with a towel and place against area 15 minutes on 15 minutes off.  You may switch to heat after 24 hours.

## 2015-02-23 ENCOUNTER — Telehealth: Payer: Self-pay | Admitting: *Deleted

## 2015-02-23 NOTE — Telephone Encounter (Signed)
No problems post procedure. 

## 2015-02-24 ENCOUNTER — Other Ambulatory Visit: Payer: Self-pay | Admitting: Pain Medicine

## 2015-02-27 ENCOUNTER — Encounter: Payer: PRIVATE HEALTH INSURANCE | Admitting: Pain Medicine

## 2015-03-07 ENCOUNTER — Encounter: Payer: Self-pay | Admitting: Pain Medicine

## 2015-03-07 ENCOUNTER — Ambulatory Visit: Payer: PRIVATE HEALTH INSURANCE | Attending: Pain Medicine | Admitting: Pain Medicine

## 2015-03-07 VITALS — BP 110/70 | HR 87 | Temp 98.6°F | Resp 16 | Ht 65.0 in | Wt 173.0 lb

## 2015-03-07 DIAGNOSIS — M48062 Spinal stenosis, lumbar region with neurogenic claudication: Secondary | ICD-10-CM

## 2015-03-07 DIAGNOSIS — M5126 Other intervertebral disc displacement, lumbar region: Secondary | ICD-10-CM | POA: Insufficient documentation

## 2015-03-07 DIAGNOSIS — M5136 Other intervertebral disc degeneration, lumbar region: Secondary | ICD-10-CM

## 2015-03-07 DIAGNOSIS — M533 Sacrococcygeal disorders, not elsewhere classified: Secondary | ICD-10-CM | POA: Insufficient documentation

## 2015-03-07 DIAGNOSIS — M5116 Intervertebral disc disorders with radiculopathy, lumbar region: Secondary | ICD-10-CM | POA: Insufficient documentation

## 2015-03-07 DIAGNOSIS — M4806 Spinal stenosis, lumbar region: Secondary | ICD-10-CM | POA: Insufficient documentation

## 2015-03-07 DIAGNOSIS — M5481 Occipital neuralgia: Secondary | ICD-10-CM | POA: Insufficient documentation

## 2015-03-07 DIAGNOSIS — G43109 Migraine with aura, not intractable, without status migrainosus: Secondary | ICD-10-CM

## 2015-03-07 DIAGNOSIS — M542 Cervicalgia: Secondary | ICD-10-CM | POA: Diagnosis present

## 2015-03-07 DIAGNOSIS — M549 Dorsalgia, unspecified: Secondary | ICD-10-CM | POA: Diagnosis present

## 2015-03-07 DIAGNOSIS — M5030 Other cervical disc degeneration, unspecified cervical region: Secondary | ICD-10-CM | POA: Insufficient documentation

## 2015-03-07 DIAGNOSIS — M5416 Radiculopathy, lumbar region: Secondary | ICD-10-CM

## 2015-03-07 DIAGNOSIS — G43101 Migraine with aura, not intractable, with status migrainosus: Secondary | ICD-10-CM

## 2015-03-07 MED ORDER — HYDROCODONE-ACETAMINOPHEN 7.5-325 MG PO TABS: ORAL_TABLET | ORAL | Status: DC

## 2015-03-07 NOTE — Progress Notes (Signed)
   Subjective:    [name], female    DOB: 24-Aug-1955, 59 y.o.   MRN: [id number]  HPI  The patient is a 59 year old female who returns to pain management for further evaluation and treatment of pain involving the neck entire back upper and lower extremity regions. The patient states that her lower back lower extremity pain is associated with some weakness after prolonged standing and walking. We have discussed performing lumbar epidural steroid injection which patient prefers to delay at this time due to concern regarding weight gain. The patient denies any recent trauma change in events of daily living to cause change in symptomatology. We have also discussed surgical reevaluation and recommend patient for surgical reevaluation as well at this time. We will continue presently prescribed medications. The patient has been advised to call pain management should they be significant change in condition prior to scheduled return appointment. Patient agrees with suggested treatment plan.     Review of Systems     Objective:   Physical Exam  There was tenderness to palpation of the splenius capitis and occipitalis musculature region a mild degree. There was mild tenderness over the cervical facet cervical paraspinal musculature region. Palpation of the acromioclavicular and glenohumeral joint regions reproduce mild discomfort. There appeared to be unremarkable Spurling's maneuver and Tinel and Phalen's maneuver were without increased pain of significant degree palpation over the thoracic facet thoracic paraspinal musculature region was attends to palpation of moderate degree with moderate muscle spasms in the lower thoracic paraspinal musculature region. There was tenderness to palpation over the lumbar paraspinal musculature region lumbar facet region of moderate degree. Lateral bending rotation extension and palpation of the lumbar facets reproduce moderate discomfort. There was  moderate tenderness to palpation of the PSIS and PII S regions. There was mild tenderness of the greater trochanteric region iliotibial band region. Straight leg raising is limited to 20 without an increase of pain with dorsiflexion noted. There was negative clonus negative Homans. DTRs appeared to be trace at the knees. No sensory deficit or dermatomal distribution detected. Abdomen was nontender with no costovertebral tenderness noted      Assessment & Plan:    Degenerative disc disease lumbar spine Shallow rightward disc protrusion with mild right subarticular narrowing. Mild facet hypertrophy L5-S1 without significant foraminal stenosis  Lumbar stenosis with neurogenic claudication  Lumbar radiculopathy  Lumbar facet syndrome  Sacroiliac joint dysfunction  Bilateral occipital neuralgia  Degenerative disc disease of the cervical spine   PLAN   Continue present medication hydrocodone acetaminophen  F/U PCP Dr. Iona Beard for evaliation of  BP and general medical  condition  F/U surgical evaluation. Surgical reevaluation has been addressed Recommend surgical reevaluation at this time  F/U neurological evaluation. May consider [facility] EMG studies as discussed with patient  May consider radiofrequency rhizolysis or intraspinal procedures pending response to present treatment and F/U evaluation   Patient to call Pain Management Center should patient have concerns prior to scheduled return appointment.

## 2015-03-07 NOTE — Progress Notes (Signed)
Safety precautions to be maintained throughout the outpatient stay will include: orient to surroundings, keep bed in low position, maintain call bell within reach at all times, provide assistance with transfer out of bed and ambulation.  

## 2015-03-07 NOTE — Patient Instructions (Addendum)
PLAN   Continue present medication hydrocodone acetaminophen  F/U PCP Dr. Iona Beard for evaliation of  BP and general medical  condition  F/U surgical evaluation  F/U neurological evaluation  May consider radiofrequency rhizolysis or intraspinal procedures pending response to present treatment and F/U evaluation   Patient to call Pain Management Center should patient have concerns prior to scheduled return appointment.

## 2015-03-24 ENCOUNTER — Telehealth: Payer: Self-pay

## 2015-03-24 NOTE — Telephone Encounter (Signed)
Pt myrbetriq has been approved for 12/09/14-12/08/17.

## 2015-04-05 ENCOUNTER — Telehealth: Payer: Self-pay | Admitting: Pain Medicine

## 2015-04-05 NOTE — Telephone Encounter (Signed)
Patient informed that Dr. Primus Bravo is a provider for her insurance.

## 2015-04-05 NOTE — Telephone Encounter (Signed)
Raquel Sarna is going to call patient today to discuss issues

## 2015-04-05 NOTE — Telephone Encounter (Signed)
Nurses and [place] patient to keep her appointment as scheduled for Thursday, 04/06/2015 Also inform patient that I am accepting her insurance and that I am on the list of providers for her insurance

## 2015-04-05 NOTE — Telephone Encounter (Signed)
Has bc[employer]  Blue value for January and cannot pay self pay fee Thurs. Is it ok if her pcp will write her meds for this month until new ins kicks in for Feb ?

## 2015-04-06 ENCOUNTER — Encounter: Payer: Self-pay | Admitting: Pain Medicine

## 2015-04-06 ENCOUNTER — Ambulatory Visit: Payer: BLUE CROSS/BLUE SHIELD | Attending: Pain Medicine | Admitting: Pain Medicine

## 2015-04-06 VITALS — BP 122/74 | HR 94 | Temp 98.2°F | Resp 16 | Ht 65.0 in | Wt 168.0 lb

## 2015-04-06 DIAGNOSIS — G43109 Migraine with aura, not intractable, without status migrainosus: Secondary | ICD-10-CM

## 2015-04-06 DIAGNOSIS — M5126 Other intervertebral disc displacement, lumbar region: Secondary | ICD-10-CM | POA: Diagnosis not present

## 2015-04-06 DIAGNOSIS — M5481 Occipital neuralgia: Secondary | ICD-10-CM | POA: Insufficient documentation

## 2015-04-06 DIAGNOSIS — M546 Pain in thoracic spine: Secondary | ICD-10-CM | POA: Diagnosis present

## 2015-04-06 DIAGNOSIS — M4806 Spinal stenosis, lumbar region: Secondary | ICD-10-CM | POA: Insufficient documentation

## 2015-04-06 DIAGNOSIS — M5030 Other cervical disc degeneration, unspecified cervical region: Secondary | ICD-10-CM | POA: Diagnosis not present

## 2015-04-06 DIAGNOSIS — M542 Cervicalgia: Secondary | ICD-10-CM | POA: Diagnosis present

## 2015-04-06 DIAGNOSIS — M79606 Pain in leg, unspecified: Secondary | ICD-10-CM | POA: Diagnosis present

## 2015-04-06 DIAGNOSIS — M5136 Other intervertebral disc degeneration, lumbar region: Secondary | ICD-10-CM

## 2015-04-06 DIAGNOSIS — M5116 Intervertebral disc disorders with radiculopathy, lumbar region: Secondary | ICD-10-CM | POA: Diagnosis not present

## 2015-04-06 DIAGNOSIS — G43101 Migraine with aura, not intractable, with status migrainosus: Secondary | ICD-10-CM

## 2015-04-06 DIAGNOSIS — M533 Sacrococcygeal disorders, not elsewhere classified: Secondary | ICD-10-CM | POA: Insufficient documentation

## 2015-04-06 DIAGNOSIS — M48062 Spinal stenosis, lumbar region with neurogenic claudication: Secondary | ICD-10-CM

## 2015-04-06 DIAGNOSIS — M5416 Radiculopathy, lumbar region: Secondary | ICD-10-CM

## 2015-04-06 MED ORDER — HYDROCODONE-ACETAMINOPHEN 7.5-325 MG PO TABS: ORAL_TABLET | ORAL | Status: DC

## 2015-04-06 NOTE — Progress Notes (Signed)
Subjective:    [name], female    DOB: [birth year], 60 y.o.   MRN: [id number]  HPI The patient is a 60 year old female who returns to pain management Center for further evaluation and treatment of pain involving the region of the neck entire back upper and lower extremity regions.. The patient admits to multiple arthralgias and myalgias was severe tenderness to palpation of paraspinal misreading cervical region cervical facet region and with pain radiating from the back of the neck traveling to the hip precipitating headaches. Patient continues to have pain of the mid lower back lower extremity region states the pain of the neck and headaches have been most bothersome and incapacitating. We discussed patient's condition and will proceed with interventional treatment at time return appointment. The patient was without headache which appeared to be due to other significant abnormalities and appeared to be with significant component of greater occipital neuralgia myofascial pain related headaches. He denied any symptomatology suggestive of intracranial abnormalities or cardiovascular abnormalities are neurological abnormalities contributing to patient's condition and appeared to be with headaches significant component of which was musculoskeletal with significant component of pain due to greater occipital neuralgia myofascial pain related headaches. The patient was with understanding and in agreement suggested treatment plan to proceed with greater occipital nerve blocks at time return appointment in attempt to decrease severity of headache and pain of the cervical thoracic region, decreased progression of symptoms, and avoid need for more involved treatment.   Review of Systems     Objective:   Physical Exam  There was tenderness over the splenius capitis and occipitalis musculature regions of moderately severe degree with palpation over the cervical facet cervical paraspinal  musculature region reproducing moderately severe degree of pain as well as moderate increase of pain with palpation of the trapezius levator scapula and rhomboid musculature regions. Palpation of the acromioclavicular and glenohumeral joint region was with reproduction of pain of moderate degree and patient appeared to be with bilaterally equal grip strength with Tinel and Phalen's maneuver reproducing minimal discomfort. Palpation thoracic region thoracic facet region was attends to palpation with no crepitus of the thoracic region noted. Palpation over the lumbar paraspinal muscles lumbar facet region was with moderate tenderness to palpation with lateral bending rotation extension and palpation of the lumbar facets reproducing moderate discomfort as well as moderate tenderness of the PSIS PII S region and gluteal and piriformis musculature region with minimal tenderness of the greater trochanteric region iliotibial band region. Straight leg raise was tolerates approximately 20 without increased pain with dorsiflexion noted with negative clonus negative Homans. DTRs appeared to be trace at the knees neurodeficit deficit or dermatomal distribution detected. Negative clonus negative Homans. Abdomen nontender with no costovertebral tenderness noted.      Assessment & Plan:    Degenerative disc disease lumbar spine Shallow rightward disc protrusion with mild right subarticular narrowing. Mild facet hypertrophy L5-S1 without significant foraminal stenosis  Lumbar stenosis with neurogenic claudication  Lumbar radiculopathy  Lumbar facet syndrome  Sacroiliac joint dysfunction  Bilateral occipital neuralgia  Degenerative disc disease of the cervical spine     PLAN   Continue present medication hydrocodone acetaminophen  Greater occipital nerve block to be performed at time return appointment  F/U PCP Dr. Iona Beard for evaliation of  BP and general medical  condition  F/U surgical  evaluation  F/U neurological evaluation  May consider radiofrequency rhizolysis or intraspinal procedures pending response to present treatment and F/U evaluation   Patient  to call Pain Management Center should patient have concerns prior to scheduled return appointment.Pain Management

## 2015-04-06 NOTE — Progress Notes (Signed)
Safety precautions to be maintained throughout the outpatient stay will include: orient to surroundings, keep bed in low position, maintain call bell within reach at all times, provide assistance with transfer out of bed and ambulation.  

## 2015-04-06 NOTE — Patient Instructions (Addendum)
PLAN   Continue present medication hydrocodone acetaminophen  Greater occipital nerve block to be performed at time return appointment  F/U PCP Dr. Iona Beard for evaliation of  BP and general medical  condition  F/U surgical evaluation  F/U neurological evaluation  May consider radiofrequency rhizolysis or intraspinal procedures pending response to present treatment and F/U evaluation   Patient to call Pain Management Center should patient have concerns prior to scheduled return appointment.Pain Management Discharge Instructions  General Discharge Instructions :  If you need to reach your doctor call: Monday-Friday 8:00 am - 4:00 pm at [phone] or toll free [phone].  After clinic hours [phone] to have operator reach doctor.  Bring all of your medication bottles to all your appointments in the pain clinic.  To cancel or reschedule your appointment with Pain Management please remember to call 24 hours in advance to avoid a fee.  Refer to the educational materials which you have been given on: General Risks, I had my Procedure. Discharge Instructions, Post Sedation.  Post Procedure Instructions:  The drugs you were given will stay in your system until tomorrow, so for the next 24 hours you should not drive, make any legal decisions or drink any alcoholic beverages.  You may eat anything you prefer, but it is better to start with liquids then soups and crackers, and gradually work up to solid foods.  Please notify your doctor immediately if you have any unusual bleeding, trouble breathing or pain that is not related to your normal pain.  Depending on the type of procedure that was done, some parts of your body may feel week and/or numb.  This usually clears up by tonight or the next day.  Walk with the use of an assistive device or accompanied by an adult for the 24 hours.  You may use ice on the affected area for the first 24 hours.  Put ice in a Ziploc bag and cover  with a towel and place against area 15 minutes on 15 minutes off.  You may switch to heat after 24 hours.Occipital Nerve Block Patient Information  Description: The occipital nerves originate in the cervical (neck) spinal cord and travel upward through muscle and tissue to supply sensation to the back of the head and top of the scalp.  In addition, the nerves control some of the muscles of the scalp.  Occipital neuralgia is an irritation of these nerves which can cause headaches, numbness of the scalp, and neck discomfort.     The occipital nerve block will interrupt nerve transmission through these nerves and can relieve pain and spasm.  The block consists of insertion of a small needle under the skin in the back of the head to deposit local anesthetic (numbing medicine) and/or steroids around the nerve.  The entire block usually lasts less than 5 minutes.  Conditions which may be treated by occipital blocks:   Muscular pain and spasm of the scalp  Nerve irritation, back of the head  Headaches  Upper neck pain  Preparation for the injection:  1. Do not eat any solid food or dairy products within 6 hours of your appointment. 2. You may drink clear liquids up to 2 hours before appointment.  Clear liquids include water, black coffee, juice or soda.  No milk or cream please. 3. You may take your regular medication, including pain medications, with a sip of water before you appointment.  Diabetics should hold regular insulin (if taken separately) and take 1/2 normal NPH dose  the morning of the procedure.  Carry some sugar containing items with you to your appointment. 4. A driver must accompany you and be prepared to drive you home after your procedure. 5. Bring all your current medications with you. 6. An IV may be inserted and sedation may be given at the discretion of the physician. 7. A blood pressure cuff, EKG, and other monitors will often be applied during the procedure.  Some patients  may need to have extra oxygen administered for a short period. 8. You will be asked to provide medical information, including your allergies and medications, prior to the procedure.  We must know immediately if you are taking blood thinners (like Coumadin/Warfarin) or if you are allergic to IV iodine contrast (dye).  We must know if you could possible be pregnant.  9. Do not wear a high collared shirt or turtleneck.  Tie long hair up in the back if possible.  Possible side-effects:   Bleeding from needle site  Infection (rare, may require surgery)  Nerve injury (rare)  Hair on back of neck can be tinged with iodine scrub (this will wash out)  Light-headedness (temporary)  Pain at injection site (several days)  Decreased blood pressure (rare, temporary)  Seizure (very rare)  Call if you experience:   Hives or difficulty breathing ( go to the emergency room)  Inflammation or drainage at the injection site(s)  Please note:  Although the local anesthetic injected can often make your painful muscles or headache feel good for several hours after the injection, the pain may return.  It takes 3-7 days for steroids to work.  You may not notice any pain relief for at least one week.  If effective, we will often do a series of injections spaced 3-6 weeks apart to maximally decrease your pain.  If you have any questions, please call [phone] [place]  What are the risk, side effects and possible complications? Generally speaking, most procedures are safe.  However, with any procedure there are risks, side effects, and the possibility of complications.  The risks and complications are dependent upon the sites that are lesioned, or the type of nerve block to be performed.  The closer the procedure is to the spine, the more serious the risks are.  Great care is taken when placing the radio frequency needles, block  needles or lesioning probes, but sometimes complications can occur. 1. Infection: Any time there is an injection through the skin, there is a risk of infection.  This is why sterile conditions are used for these blocks.  There are four possible types of infection. 1. Localized skin infection. 2. Central Nervous System Infection-This can be in the form of Meningitis, which can be deadly. 3. Epidural Infections-This can be in the form of an epidural abscess, which can cause pressure inside of the spine, causing compression of the spinal cord with subsequent paralysis. This would require an emergency surgery to decompress, and there are no guarantees that the patient would recover from the paralysis. 4. Discitis-This is an infection of the intervertebral discs.  It occurs in about 1% of discography procedures.  It is difficult to treat and it may lead to surgery.        2. Pain: the needles have to go through skin and soft tissues, will cause soreness.       3. Damage to internal structures:  The nerves to be lesioned may be near blood  vessels or    other nerves which can be potentially damaged.       4. Bleeding: Bleeding is more common if the patient is taking blood thinners such as  aspirin, Coumadin, Ticiid, Plavix, etc., or if he/she have some genetic predisposition  such as hemophilia. Bleeding into the spinal canal can cause compression of the spinal  cord with subsequent paralysis.  This would require an emergency surgery to  decompress and there are no guarantees that the patient would recover from the  paralysis.       5. Pneumothorax:  Puncturing of a lung is a possibility, every time a needle is introduced in  the area of the chest or upper back.  Pneumothorax refers to free air around the  collapsed lung(s), inside of the thoracic cavity (chest cavity).  Another two possible  complications related to a similar event would include: Hemothorax and Chylothorax.   These are variations of the  Pneumothorax, where instead of air around the collapsed  lung(s), you may have blood or chyle, respectively.       6. Spinal headaches: They may occur with any procedures in the area of the spine.       7. Persistent CSF (Cerebro-Spinal Fluid) leakage: This is a rare problem, but may occur  with prolonged intrathecal or epidural catheters either due to the formation of a fistulous  track or a dural tear.       8. Nerve damage: By working so close to the spinal cord, there is always a possibility of  nerve damage, which could be as serious as a permanent spinal cord injury with  paralysis.       9. Death:  Although rare, severe deadly allergic reactions known as "Anaphylactic  reaction" can occur to any of the medications used.      10. Worsening of the symptoms:  We can always make thing worse.  What are the chances of something like this happening? Chances of any of this occuring are extremely low.  By statistics, you have more of a chance of getting killed in a motor vehicle accident: while driving to the hospital than any of the above occurring .  Nevertheless, you should be aware that they are possibilities.  In general, it is similar to taking a shower.  Everybody knows that you can slip, hit your head and get killed.  Does that mean that you should not shower again?  Nevertheless always keep in mind that statistics do not mean anything if you happen to be on the wrong side of them.  Even if a procedure has a 1 (one) in a 1,000,000 (million) chance of going wrong, it you happen to be that one..Also, keep in mind that by statistics, you have more of a chance of having something go wrong when taking medications.  Who should not have this procedure? If you are on a blood thinning medication (e.g. Coumadin, Plavix, see list of "Blood Thinners"), or if you have an active infection going on, you should not have the procedure.  If you are taking any blood thinners, please inform your physician.  How  should I prepare for this procedure?  Do not eat or drink anything at least six hours prior to the procedure.  Bring a driver with you .  It cannot be a taxi.  Come accompanied by an adult that can drive you back, and that is strong enough to help you if your legs get weak or numb from  the local anesthetic.  Take all of your medicines the morning of the procedure with just enough water to swallow them.  If you have diabetes, make sure that you are scheduled to have your procedure done first thing in the morning, whenever possible.  If you have diabetes, take only half of your insulin dose and notify our nurse that you have done so as soon as you arrive at the clinic.  If you are diabetic, but only take blood sugar pills (oral hypoglycemic), then do not take them on the morning of your procedure.  You may take them after you have had the procedure.  Do not take aspirin or any aspirin-containing medications, at least eleven (11) days prior to the procedure.  They may prolong bleeding.  Wear loose fitting clothing that may be easy to take off and that you would not mind if it got stained with Betadine or blood.  Do not wear any jewelry or perfume  Remove any nail coloring.  It will interfere with some of our monitoring equipment.  NOTE: Remember that this is not meant to be interpreted as a complete list of all possible complications.  Unforeseen problems may occur.  BLOOD THINNERS The following drugs contain aspirin or other products, which can cause increased bleeding during surgery and should not be taken for 2 weeks prior to and 1 week after surgery.  If you should need take something for relief of minor pain, you may take acetaminophen which is found in Tylenol,m Datril, Anacin-3 and Panadol. It is not blood thinner. The products listed below are.  Do not take any of the products listed below in addition to any listed on your instruction sheet.  A.P.C or A.P.C with Codeine Codeine  Phosphate Capsules #3 Ibuprofen Ridaura  ABC compound Congesprin Imuran rimadil  Advil Cope Indocin Robaxisal  Alka-Seltzer Effervescent Pain Reliever and Antacid Coricidin or Coricidin-D  Indomethacin Rufen  Alka-Seltzer plus Cold Medicine Cosprin Ketoprofen S-A-C Tablets  Anacin Analgesic Tablets or Capsules Coumadin Korlgesic Salflex  Anacin Extra Strength Analgesic tablets or capsules CP-2 Tablets Lanoril Salicylate  Anaprox Cuprimine Capsules Levenox Salocol  Anexsia-D Dalteparin Magan Salsalate  Anodynos Darvon compound Magnesium Salicylate Sine-off  Ansaid Dasin Capsules Magsal Sodium Salicylate  Anturane Depen Capsules Marnal Soma  APF Arthritis pain formula Dewitt's Pills Measurin Stanback  Argesic Dia-Gesic Meclofenamic Sulfinpyrazone  Arthritis Bayer Timed Release Aspirin Diclofenac Meclomen Sulindac  Arthritis pain formula Anacin Dicumarol Medipren Supac  Analgesic (Safety coated) Arthralgen Diffunasal Mefanamic Suprofen  Arthritis Strength Bufferin Dihydrocodeine Mepro Compound Suprol  Arthropan liquid Dopirydamole Methcarbomol with Aspirin Synalgos  ASA tablets/Enseals Disalcid Micrainin Tagament  Ascriptin Doan's Midol Talwin  Ascriptin A/D Dolene Mobidin Tanderil  Ascriptin Extra Strength Dolobid Moblgesic Ticlid  Ascriptin with Codeine Doloprin or Doloprin with Codeine Momentum Tolectin  Asperbuf Duoprin Mono-gesic Trendar  Aspergum Duradyne Motrin or Motrin IB Triminicin  Aspirin plain, buffered or enteric coated Durasal Myochrisine Trigesic  Aspirin Suppositories Easprin Nalfon Trillsate  Aspirin with Codeine Ecotrin Regular or Extra Strength Naprosyn Uracel  Atromid-S Efficin Naproxen Ursinus  Auranofin Capsules Elmiron Neocylate Vanquish  Axotal Emagrin Norgesic Verin  Azathioprine Empirin or Empirin with Codeine Normiflo Vitamin E  Azolid Emprazil Nuprin Voltaren  Bayer Aspirin plain, buffered or children's or timed BC Tablets or powders Encaprin Orgaran  Warfarin Sodium  Buff-a-Comp Enoxaparin Orudis Zorpin  Buff-a-Comp with Codeine Equegesic Os-Cal-Gesic   Buffaprin Excedrin plain, buffered or Extra Strength Oxalid   Bufferin Arthritis Strength Feldene Oxphenbutazone   Bufferin plain  or Extra Strength Feldene Capsules Oxycodone with Aspirin   Bufferin with Codeine Fenoprofen Fenoprofen Pabalate or Pabalate-SF   Buffets II Flogesic Panagesic   Buffinol plain or Extra Strength Florinal or Florinal with Codeine Panwarfarin   Buf-Tabs Flurbiprofen Penicillamine   Butalbital Compound Four-way cold tablets Penicillin   Butazolidin Fragmin Pepto-Bismol   Carbenicillin Geminisyn Percodan   Carna Arthritis Reliever Geopen Persantine   Carprofen Gold's salt Persistin   Chloramphenicol Goody's Phenylbutazone   Chloromycetin Haltrain Piroxlcam   Clmetidine heparin Plaquenil   Cllnoril Hyco-pap Ponstel   Clofibrate Hydroxy chloroquine Propoxyphen         Before stopping any of these medications, be sure to consult the physician who ordered them.  Some, such as Coumadin (Warfarin) are ordered to prevent or treat serious conditions such as "deep thrombosis", "pumonary embolisms", and other heart problems.  The amount of time that you may need off of the medication may also vary with the medication and the reason for which you were taking it.  If you are taking any of these medications, please make sure you notify your pain physician before you undergo any procedures.

## 2015-04-11 ENCOUNTER — Telehealth: Payer: Self-pay | Admitting: Pain Medicine

## 2015-04-11 NOTE — Telephone Encounter (Signed)
Having severe pain all weekend, meds not helping, what can be done

## 2015-04-12 ENCOUNTER — Telehealth: Payer: Self-pay | Admitting: *Deleted

## 2015-04-12 NOTE — Telephone Encounter (Signed)
Nurses and Secretaries  As previously requested please schedule patient for lumbosacral selective nerve root block for Monday, January 30 or Wednesday, February 1

## 2015-04-12 NOTE — Telephone Encounter (Signed)
Attempted to call patient to ask details of pain. Message left.

## 2015-04-12 NOTE — Telephone Encounter (Signed)
Nurses and Secretaries Please call patient today and describe patient's pain to me in further detail. Need details of pain so that we can decide plan of treatment . Please discuss with me today so that we can call patient today and present plan the patient

## 2015-04-12 NOTE — Telephone Encounter (Signed)
Spoke with patient re; description of pain and routed back to Dr Primus Bravo.

## 2015-04-12 NOTE — Telephone Encounter (Signed)
Spoke with patient, shoulders, back, hips and knees was hurting at medication evaluation but has become more aggravated over the weekend.  Has taken her medicine and muscle relaxers.  Pain is better today and is not as intense as it was over the weekend.  States that the lower back and hips hurt the most.

## 2015-04-13 ENCOUNTER — Other Ambulatory Visit: Payer: Self-pay | Admitting: Pain Medicine

## 2015-04-13 DIAGNOSIS — M5136 Other intervertebral disc degeneration, lumbar region: Secondary | ICD-10-CM

## 2015-04-13 DIAGNOSIS — M533 Sacrococcygeal disorders, not elsewhere classified: Secondary | ICD-10-CM

## 2015-04-13 DIAGNOSIS — M961 Postlaminectomy syndrome, not elsewhere classified: Secondary | ICD-10-CM

## 2015-04-13 DIAGNOSIS — M51369 Other intervertebral disc degeneration, lumbar region without mention of lumbar back pain or lower extremity pain: Secondary | ICD-10-CM

## 2015-04-13 DIAGNOSIS — M47816 Spondylosis without myelopathy or radiculopathy, lumbar region: Secondary | ICD-10-CM

## 2015-04-13 NOTE — Telephone Encounter (Signed)
[employer] does not require a prior auth for SNRB

## 2015-04-13 NOTE — Telephone Encounter (Signed)
Patient called and instructed that Rachael Jordan would call her to schedule a procedure appointment.

## 2015-04-14 ENCOUNTER — Other Ambulatory Visit: Payer: Self-pay | Admitting: Pain Medicine

## 2015-04-17 ENCOUNTER — Ambulatory Visit: Payer: BLUE CROSS/BLUE SHIELD | Attending: Pain Medicine | Admitting: Pain Medicine

## 2015-04-17 ENCOUNTER — Encounter: Payer: Self-pay | Admitting: Pain Medicine

## 2015-04-17 VITALS — BP 111/68 | HR 71 | Temp 98.2°F | Resp 17 | Wt 170.0 lb

## 2015-04-17 DIAGNOSIS — M5126 Other intervertebral disc displacement, lumbar region: Secondary | ICD-10-CM | POA: Diagnosis not present

## 2015-04-17 DIAGNOSIS — M5481 Occipital neuralgia: Secondary | ICD-10-CM

## 2015-04-17 DIAGNOSIS — M5136 Other intervertebral disc degeneration, lumbar region: Secondary | ICD-10-CM | POA: Diagnosis not present

## 2015-04-17 DIAGNOSIS — M545 Low back pain: Secondary | ICD-10-CM | POA: Diagnosis present

## 2015-04-17 DIAGNOSIS — M5030 Other cervical disc degeneration, unspecified cervical region: Secondary | ICD-10-CM

## 2015-04-17 DIAGNOSIS — M5416 Radiculopathy, lumbar region: Secondary | ICD-10-CM

## 2015-04-17 DIAGNOSIS — M48062 Spinal stenosis, lumbar region with neurogenic claudication: Secondary | ICD-10-CM

## 2015-04-17 DIAGNOSIS — G43101 Migraine with aura, not intractable, with status migrainosus: Secondary | ICD-10-CM

## 2015-04-17 DIAGNOSIS — M51369 Other intervertebral disc degeneration, lumbar region without mention of lumbar back pain or lower extremity pain: Secondary | ICD-10-CM

## 2015-04-17 DIAGNOSIS — G43109 Migraine with aura, not intractable, without status migrainosus: Secondary | ICD-10-CM

## 2015-04-17 DIAGNOSIS — M533 Sacrococcygeal disorders, not elsewhere classified: Secondary | ICD-10-CM

## 2015-04-17 MED ORDER — LACTATED RINGERS IV SOLN: 1000.0000 mL | INTRAVENOUS | Status: DC

## 2015-04-17 MED ORDER — FENTANYL CITRATE (PF) 100 MCG/2ML IJ SOLN
INTRAMUSCULAR | Status: AC
  Administered 2015-04-17: 100 ug via INTRAVENOUS
  Filled 2015-04-17: qty 2

## 2015-04-17 MED ORDER — CEFUROXIME AXETIL 250 MG PO TABS: 250.0000 mg | ORAL_TABLET | Freq: Two times a day (BID) | ORAL | Status: DC

## 2015-04-17 MED ORDER — ORPHENADRINE CITRATE 30 MG/ML IJ SOLN: 60.0000 mg | Freq: Once | INTRAMUSCULAR | Status: DC

## 2015-04-17 MED ORDER — MIDAZOLAM HCL 5 MG/5ML IJ SOLN
INTRAMUSCULAR | Status: AC
  Administered 2015-04-17: 5 mg via INTRAVENOUS
  Filled 2015-04-17: qty 5

## 2015-04-17 MED ORDER — CEFAZOLIN SODIUM 1-5 GM-% IV SOLN: 1.0000 g | Freq: Once | INTRAVENOUS | Status: DC

## 2015-04-17 MED ORDER — TRIAMCINOLONE ACETONIDE 40 MG/ML IJ SUSP
INTRAMUSCULAR | Status: AC
  Administered 2015-04-17: 12:00:00
  Filled 2015-04-17: qty 1

## 2015-04-17 MED ORDER — CEFAZOLIN SODIUM 1 G IJ SOLR
INTRAMUSCULAR | Status: AC
  Administered 2015-04-17: 1 g via INTRAVENOUS
  Filled 2015-04-17: qty 10

## 2015-04-17 MED ORDER — FENTANYL CITRATE (PF) 100 MCG/2ML IJ SOLN: 100.0000 ug | Freq: Once | INTRAMUSCULAR | Status: DC

## 2015-04-17 MED ORDER — ORPHENADRINE CITRATE 30 MG/ML IJ SOLN
INTRAMUSCULAR | Status: AC
  Administered 2015-04-17: 12:00:00
  Filled 2015-04-17: qty 2

## 2015-04-17 MED ORDER — BUPIVACAINE HCL (PF) 0.25 % IJ SOLN: 30.0000 mL | Freq: Once | INTRAMUSCULAR | Status: DC

## 2015-04-17 MED ORDER — TRIAMCINOLONE ACETONIDE 40 MG/ML IJ SUSP: 40.0000 mg | Freq: Once | INTRAMUSCULAR | Status: DC

## 2015-04-17 MED ORDER — LIDOCAINE HCL (PF) 1 % IJ SOLN: 10.0000 mL | Freq: Once | INTRAMUSCULAR | Status: DC

## 2015-04-17 MED ORDER — BUPIVACAINE HCL (PF) 0.25 % IJ SOLN
INTRAMUSCULAR | Status: AC
  Administered 2015-04-17: 12:00:00
  Filled 2015-04-17: qty 30

## 2015-04-17 MED ORDER — MIDAZOLAM HCL 5 MG/5ML IJ SOLN: 5.0000 mg | Freq: Once | INTRAMUSCULAR | Status: DC

## 2015-04-17 NOTE — Progress Notes (Signed)
Subjective:    [name], female    DOB: 13-Nov-1955, 60 y.o.   MRN: [id number]  HPI  PROCEDURE PERFORMED: Lumbosacral selective nerve root block   NOTE: The patient is a 60 y.o. female who returns to [facility] for further evaluation and treatment of pain involving the lumbar and lower extremity region. Studies consisting of MRI has revealed the patient to be with evidence of Degenerative disc disease lumbar spine  With shallow rightward disc protrusion with mild right subarticular narrowing. Mild facet hypertrophy L5-S1 without significant foraminal stenosis. There is concern regarding intraspinal abnormalities contributing to the patient's symptomatology. . There is concern regarding lumbar radiculopathy as well as lumbar stenosis contributing to patient's symptomatology The risks, benefits, and expectations of the procedure have been explained to the patient who was understanding and in agreement with suggested treatment plan. We will proceed with interventional treatment as discussed and as explained to the patient. The patient is understanding and in agreement with suggested treatment plan.   DESCRIPTION OF PROCEDURE: Lumbosacral selective nerve root block with IV Versed, IV fentanyl conscious sedation, EKG, blood pressure, pulse, and pulse oximetry monitoring. The procedure was performed with the patient in the prone position under fluoroscopic guidance. With the patient in the prone position, Betadine prep of proposed entry site was performed. Local anesthetic skin wheal of proposed needle entry site was prepared with 1.5% plain lidocaine with AP view of the lumbosacral spine.   PROCEDURE #1: Needle placement at the left L 2 vertebral body: A 22 -gauge needle was inserted at the inferior border of the transverse process of the vertebral body with needle placed medial to the midline of the transverse process on AP view of the lumbosacral spine.   NEEDLE  PLACEMENT AT  L3, L4, and L5  VERTEBRAL BODY LEVELS  Needle  placement was accomplished at L3, L4, and L5  vertebral body levels on the left side exactly as was accomplished at the L2  vertebral body level  and utilizing the same technique and under fluoroscopic guidance.    Needle placement was then verified on lateral view at all levels with needle tip documented to be in the posterior superior quadrant of the intervertebral foramen of  L 2, L3, L4, and L5. Following negative aspiration for heme and CSF at each level, each level was injected with 3 mL of 0.25% bupivacaine with Kenalog.   LUMBOSACRAL SELECTIVE NERVE ROOT BLOCKS THE THE  RIGHT SIDE  The procedure was performed on the right side exactly as was performed on the left side and at the same levels  Under fluoroscopic guidance and utilizing the same technique.    The patient tolerated the procedure well. A total of 10 mg of Kenalog was utilized for the procedure.   PLAN:  1. Medications: Will continue presently prescribed medications. Hydrocodone acetaminophen 2. The patient is to undergo follow-up evaluation with PCP Dr. Iona Beard for evaluation of blood pressure and general medical condition status post procedure performed on today's visit. 3. Surgical follow-up evaluation. We may proceed with further surgical evaluation as discussed with patient 4. Neurological evaluation.May consider PNCV EMG studies and other studies 5. May consider radiofrequency procedures, implantation type procedures and other treatment pending response to treatment and follow-up evaluation. 6. The patient has been advise do adhere to proper body mechanics and avoid activities which may aggravate condition. 7. The patient has been advised to call the Pain Management Center prior to scheduled return appointment should there  be significant change in the patient's condition or should the patient have other concerns regarding condition prior to scheduled return  appointment.   Review of Systems     Objective:   Physical Exam        Assessment & Plan:

## 2015-04-17 NOTE — Progress Notes (Signed)
Safety precautions to be maintained throughout the outpatient stay will include: orient to surroundings, keep bed in low position, maintain call bell within reach at all times, provide assistance with transfer out of bed and ambulation.  

## 2015-04-17 NOTE — Patient Instructions (Addendum)
PLAN  Continue present medication hydrocodone acetaminophen and begin taking antibiotic Ceftin as prescribed. Please obtain your antibiotic Ceftin today and begin taking antibiotic today  F/U PCP Dr. Iona Beard for evaliation of  BP and general medical  condition.   F/U surgical evaluation. Surgical evaluation as discussed  F/U neurological evaluation. May consider pending follow-up evaluations  May consider radiofrequency rhizolysis or intraspinal procedures pending response to present treatment and F/U evaluation.  Patient to call Pain Management Center should patient have concerns prior to scheduled return appointment.Facet Blocks Patient Information  Description: The facets are joints in the spine between the vertebrae.  Like any joints in the body, facets can become irritated and painful.  Arthritis can also effect the facets.  By injecting steroids and local anesthetic in and around these joints, we can temporarily block the nerve supply to them.  Steroids act directly on irritated nerves and tissues to reduce selling and inflammation which often leads to decreased pain.  Facet blocks may be done anywhere along the spine from the neck to the low back depending upon the location of your pain.   After numbing the skin with local anesthetic (like Novocaine), a small needle is passed onto the facet joints under x-ray guidance.  You may experience a sensation of pressure while this is being done.  The entire block usually lasts about 15-25 minutes.   Conditions which may be treated by facet blocks:   Low back/buttock pain  Neck/shoulder pain  Certain types of headaches  Preparation for the injection:  1. Do not eat any solid food or dairy products within 6 hours of your appointment. 2. You may drink clear liquid up to 2 hours before appointment.  Clear liquids include water, black coffee, juice or soda.  No milk or cream please. 3. You may take your regular medication, including pain  medications, with a sip of water before your appointment.  Diabetics should hold regular insulin (if taken separately) and take 1/2 normal NPH dose the morning of the procedure.  Carry some sugar containing items with you to your appointment. 4. A driver must accompany you and be prepared to drive you home after your procedure. 5. Bring all your current medications with you. 6. An IV may be inserted and sedation may be given at the discretion of the physician. 7. A blood pressure cuff, EKG and other monitors will often be applied during the procedure.  Some patients may need to have extra oxygen administered for a short period. 8. You will be asked to provide medical information, including your allergies and medications, prior to the procedure.  We must know immediately if you are taking blood thinners (like Coumadin/Warfarin) or if you are allergic to IV iodine contrast (dye).  We must know if you could possible be pregnant.  Possible side-effects:   Bleeding from needle site  Infection (rare, may require surgery)  Nerve injury (rare)  Numbness & tingling (temporary)  Difficulty urinating (rare, temporary)  Spinal headache (a headache worse with upright posture)  Light-headedness (temporary)  Pain at injection site (serveral days)  Decreased blood pressure (rare, temporary)  Weakness in arm/leg (temporary)  Pressure sensation in back/neck (temporary)   Call if you experience:   Fever/chills associated with headache or increased back/neck pain  Headache worsened by an upright position  New onset, weakness or numbness of an extremity below the injection site  Hives or difficulty breathing (go to the emergency room)  Inflammation or drainage at the injection site(s)  Severe back/neck pain greater than usual  New symptoms which are concerning to you  Please note:  Although the local anesthetic injected can often make your back or neck feel good for several hours after  the injection, the pain will likely return. It takes 3-7 days for steroids to work.  You may not notice any pain relief for at least one week.  If effective, we will often do a series of 2-3 injections spaced 3-6 weeks apart to maximally decrease your pain.  After the initial series, you may be a candidate for a more permanent nerve block of the facets.  If you have any questions, please call #[phone] [facility]  General Discharge Instructions :  If you need to reach your doctor call: Monday-Friday 8:00 am - 4:00 pm at [phone] or toll free [phone].  After clinic hours [phone] to have operator reach doctor.  Bring all of your medication bottles to all your appointments in the pain clinic.  To cancel or reschedule your appointment with Pain Management please remember to call 24 hours in advance to avoid a fee.  Refer to the educational materials which you have been given on: General Risks, I had my Procedure. Discharge Instructions, Post Sedation.  Post Procedure Instructions:  The drugs you were given will stay in your system until tomorrow, so for the next 24 hours you should not drive, make any legal decisions or drink any alcoholic beverages.  You may eat anything you prefer, but it is better to start with liquids then soups and crackers, and gradually work up to solid foods.  Please notify your doctor immediately if you have any unusual bleeding, trouble breathing or pain that is not related to your normal pain.  Depending on the type of procedure that was done, some parts of your body may feel week and/or numb.  This usually clears up by tonight or the next day.  Walk with the use of an assistive device or accompanied by an adult for the 24 hours.  You may use ice on the affected area for the first 24 hours.  Put ice in a Ziploc bag and cover with a towel and place against area 15 minutes  on 15 minutes off.  You may switch to heat after 24 hours.

## 2015-04-18 NOTE — Telephone Encounter (Signed)
Message left

## 2015-05-04 ENCOUNTER — Encounter: Payer: PRIVATE HEALTH INSURANCE | Admitting: Pain Medicine

## 2015-06-01 ENCOUNTER — Encounter: Payer: Self-pay | Admitting: Pain Medicine

## 2015-06-01 ENCOUNTER — Ambulatory Visit: Payer: BLUE CROSS/BLUE SHIELD | Attending: Pain Medicine | Admitting: Pain Medicine

## 2015-06-01 VITALS — BP 120/74 | HR 88 | Temp 98.0°F | Resp 16 | Ht 66.0 in | Wt 163.0 lb

## 2015-06-01 DIAGNOSIS — M5030 Other cervical disc degeneration, unspecified cervical region: Secondary | ICD-10-CM

## 2015-06-01 DIAGNOSIS — M5116 Intervertebral disc disorders with radiculopathy, lumbar region: Secondary | ICD-10-CM | POA: Diagnosis not present

## 2015-06-01 DIAGNOSIS — M48062 Spinal stenosis, lumbar region with neurogenic claudication: Secondary | ICD-10-CM

## 2015-06-01 DIAGNOSIS — M5136 Other intervertebral disc degeneration, lumbar region: Secondary | ICD-10-CM

## 2015-06-01 DIAGNOSIS — M5126 Other intervertebral disc displacement, lumbar region: Secondary | ICD-10-CM | POA: Diagnosis not present

## 2015-06-01 DIAGNOSIS — M5481 Occipital neuralgia: Secondary | ICD-10-CM | POA: Diagnosis not present

## 2015-06-01 DIAGNOSIS — M533 Sacrococcygeal disorders, not elsewhere classified: Secondary | ICD-10-CM | POA: Diagnosis not present

## 2015-06-01 DIAGNOSIS — M5416 Radiculopathy, lumbar region: Secondary | ICD-10-CM

## 2015-06-01 DIAGNOSIS — M4806 Spinal stenosis, lumbar region: Secondary | ICD-10-CM | POA: Insufficient documentation

## 2015-06-01 DIAGNOSIS — G43109 Migraine with aura, not intractable, without status migrainosus: Secondary | ICD-10-CM

## 2015-06-01 DIAGNOSIS — M51369 Other intervertebral disc degeneration, lumbar region without mention of lumbar back pain or lower extremity pain: Secondary | ICD-10-CM

## 2015-06-01 DIAGNOSIS — M542 Cervicalgia: Secondary | ICD-10-CM | POA: Diagnosis present

## 2015-06-01 DIAGNOSIS — M546 Pain in thoracic spine: Secondary | ICD-10-CM | POA: Diagnosis present

## 2015-06-01 DIAGNOSIS — G43101 Migraine with aura, not intractable, with status migrainosus: Secondary | ICD-10-CM

## 2015-06-01 MED ORDER — HYDROCODONE-ACETAMINOPHEN 7.5-325 MG PO TABS: ORAL_TABLET | ORAL | Status: DC

## 2015-06-01 MED ORDER — TOPIRAMATE 25 MG PO TABS: ORAL_TABLET | ORAL | Status: DC

## 2015-06-01 NOTE — Progress Notes (Signed)
   Subjective:    [name], female    DOB: 07/12/1955, 60 y.o.   MRN: [id number]  HPI  The patient is a 60 year old female who returns to pain management for further evaluation and treatment of pain involving the neck entire back upper and lower extremity regions. At the present time patient's most bothersome symptom involves the lower back and lower extremity especially on the right. The patient states that she has healing sensations and sharp shooting pains of the right lower extremity. The symptoms are aggravated by standing walking and become more intense as the day progresses. Patient denies any trauma change in events of daily living the call significant change in symptomatology. We discussed patient's condition and will consider patient for lumbosacral selective nerve root block to be performed at time return appointment. The patient was in agreement with suggested treatment plan. The patient will continue hydrocodone acetaminophen and we will begin Topamax. Patient will avoid Neurontin. All agreed to suggested treatment plan    Review of Systems     Objective:   Physical Exam  There was tenderness to palpation of the splenius capitis and occipitalis musculature regions of mild to moderate degree. There was mild to moderate tenderness of the cervical facet cervical paraspinal musculature region. Palpation of the acromioclavicular and glenohumeral joint regions were with mild discomfort. The patient was with unremarkable Spurling's maneuver and Tinel and Phalen's maneuver were without increase of pain of significant degree. Palpation over the thoracic region thoracic facet region was with mild tenderness of the upper and mid thoracic region and moderate tenderness of the lower thoracic paraspinal musculature region with muscle spasms noted to be moderate to moderately severe right median the left. Palpation over the lumbar paraspinal musculatures and lumbar facet region  was attends to palpation with moderate to moderately severe tenderness on the right compared to the left. Straight leg raising was tolerates approximately 20 without increase of pain with dorsiflexion noted. EHL strength appeared to be decreased. There was negative clonus negative Homans. DTRs were difficult to elicit. The patient had difficulty relaxing. No definite sensory deficit or dermatomal distribution was detected. There was negative clonus negative Homans. Palpation over the PSIS and PII S region was with mild to moderate discomfort with mild tenderness on the greater trochanteric region and iliotibial band region. Abdomen was nontender with no costovertebral tenderness noted.         Assessment & Plan:     Degenerative disc disease lumbar spine Shallow rightward disc protrusion with mild right subarticular narrowing. Mild facet hypertrophy L5-S1 without significant foraminal stenosis  Lumbar stenosis with neurogenic claudication  Lumbar radiculopathy  Lumbar facet syndrome  Sacroiliac joint dysfunction  Bilateral occipital neuralgia      PLAN   Continue present medication hydrocodone acetaminophen and begin Topamax NO NEURONTIN  . Topamax is similar to Neurontin and can cause drowsiness, confusion, and excessive sedation. Be very careful when taking Topamax to avoid these undesirable side effects  Lumbosacral selective nerve root block to be performed at time of return appointment  F/U PCP Dr. Iona Beard for evaliation of  BP and general medical  condition  F/U surgical evaluation. May consider further surgical evaluation  F/U neurological evaluation. May consider [facility] EMG studies and other studies  May consider radiofrequency rhizolysis or intraspinal procedures pending response to present treatment and F/U evaluation   Patient to call Pain Management Center should patient have concerns prior to scheduled return appointment.

## 2015-06-01 NOTE — Progress Notes (Signed)
Safety precautions to be maintained throughout the outpatient stay will include: orient to surroundings, keep bed in low position, maintain call bell within reach at all times, provide assistance with transfer out of bed and ambulation.  

## 2015-06-01 NOTE — Patient Instructions (Addendum)
PLAN   Continue present medication hydrocodone acetaminophen and begin Topamax NO NEURONTIN  . Topamax is similar to Neurontin and can cause drowsiness, confusion, and excessive sedation. Be very careful when taking Topamax to avoid these undesirable side effects  Lumbosacral selective nerve root block to be performed at time of return appointment  F/U PCP Dr. Iona Beard for evaliation of  BP and general medical  condition  F/U surgical evaluation. May consider further surgical evaluation  F/U neurological evaluation. May consider PNCV EMG studies and other studies  May consider radiofrequency rhizolysis or intraspinal procedures pending response to present treatment and F/U evaluation   Patient to call Pain Management Center should patient have concerns prior to scheduled return appointment.GENERAL RISKS AND COMPLICATIONS  What are the risk, side effects and possible complications? Generally speaking, most procedures are safe.  However, with any procedure there are risks, side effects, and the possibility of complications.  The risks and complications are dependent upon the sites that are lesioned, or the type of nerve block to be performed.  The closer the procedure is to the spine, the more serious the risks are.  Great care is taken when placing the radio frequency needles, block needles or lesioning probes, but sometimes complications can occur. 1. Infection: Any time there is an injection through the skin, there is a risk of infection.  This is why sterile conditions are used for these blocks.  There are four possible types of infection. 1. Localized skin infection. 2. Central Nervous System Infection-This can be in the form of Meningitis, which can be deadly. 3. Epidural Infections-This can be in the form of an epidural abscess, which can cause pressure inside of the spine, causing compression of the spinal cord with subsequent paralysis. This would require an emergency surgery to  decompress, and there are no guarantees that the patient would recover from the paralysis. 4. Discitis-This is an infection of the intervertebral discs.  It occurs in about 1% of discography procedures.  It is difficult to treat and it may lead to surgery.        2. Pain: the needles have to go through skin and soft tissues, will cause soreness.       3. Damage to internal structures:  The nerves to be lesioned may be near blood vessels or    other nerves which can be potentially damaged.       4. Bleeding: Bleeding is more common if the patient is taking blood thinners such as  aspirin, Coumadin, Ticiid, Plavix, etc., or if he/she have some genetic predisposition  such as hemophilia. Bleeding into the spinal canal can cause compression of the spinal  cord with subsequent paralysis.  This would require an emergency surgery to  decompress and there are no guarantees that the patient would recover from the  paralysis.       5. Pneumothorax:  Puncturing of a lung is a possibility, every time a needle is introduced in  the area of the chest or upper back.  Pneumothorax refers to free air around the  collapsed lung(s), inside of the thoracic cavity (chest cavity).  Another two possible  complications related to a similar event would include: Hemothorax and Chylothorax.   These are variations of the Pneumothorax, where instead of air around the collapsed  lung(s), you may have blood or chyle, respectively.       6. Spinal headaches: They may occur with any procedures in the area of the spine.  7. Persistent CSF (Cerebro-Spinal Fluid) leakage: This is a rare problem, but may occur  with prolonged intrathecal or epidural catheters either due to the formation of a fistulous  track or a dural tear.       8. Nerve damage: By working so close to the spinal cord, there is always a possibility of  nerve damage, which could be as serious as a permanent spinal cord injury with  paralysis.       9. Death:  Although  rare, severe deadly allergic reactions known as "Anaphylactic  reaction" can occur to any of the medications used.      10. Worsening of the symptoms:  We can always make thing worse.  What are the chances of something like this happening? Chances of any of this occuring are extremely low.  By statistics, you have more of a chance of getting killed in a motor vehicle accident: while driving to the hospital than any of the above occurring .  Nevertheless, you should be aware that they are possibilities.  In general, it is similar to taking a shower.  Everybody knows that you can slip, hit your head and get killed.  Does that mean that you should not shower again?  Nevertheless always keep in mind that statistics do not mean anything if you happen to be on the wrong side of them.  Even if a procedure has a 1 (one) in a 1,000,000 (million) chance of going wrong, it you happen to be that one..Also, keep in mind that by statistics, you have more of a chance of having something go wrong when taking medications.  Who should not have this procedure? If you are on a blood thinning medication (e.g. Coumadin, Plavix, see list of "Blood Thinners"), or if you have an active infection going on, you should not have the procedure.  If you are taking any blood thinners, please inform your physician.  How should I prepare for this procedure?  Do not eat or drink anything at least six hours prior to the procedure.  Bring a driver with you .  It cannot be a taxi.  Come accompanied by an adult that can drive you back, and that is strong enough to help you if your legs get weak or numb from the local anesthetic.  Take all of your medicines the morning of the procedure with just enough water to swallow them.  If you have diabetes, make sure that you are scheduled to have your procedure done first thing in the morning, whenever possible.  If you have diabetes, take only half of your insulin dose and notify our nurse  that you have done so as soon as you arrive at the clinic.  If you are diabetic, but only take blood sugar pills (oral hypoglycemic), then do not take them on the morning of your procedure.  You may take them after you have had the procedure.  Do not take aspirin or any aspirin-containing medications, at least eleven (11) days prior to the procedure.  They may prolong bleeding.  Wear loose fitting clothing that may be easy to take off and that you would not mind if it got stained with Betadine or blood.  Do not wear any jewelry or perfume  Remove any nail coloring.  It will interfere with some of our monitoring equipment.  NOTE: Remember that this is not meant to be interpreted as a complete list of all possible complications.  Unforeseen problems may occur.  BLOOD THINNERS  The following drugs contain aspirin or other products, which can cause increased bleeding during surgery and should not be taken for 2 weeks prior to and 1 week after surgery.  If you should need take something for relief of minor pain, you may take acetaminophen which is found in Tylenol,m Datril, Anacin-3 and Panadol. It is not blood thinner. The products listed below are.  Do not take any of the products listed below in addition to any listed on your instruction sheet.  A.P.C or A.P.C with Codeine Codeine Phosphate Capsules #3 Ibuprofen Ridaura  ABC compound Congesprin Imuran rimadil  Advil Cope Indocin Robaxisal  Alka-Seltzer Effervescent Pain Reliever and Antacid Coricidin or Coricidin-D  Indomethacin Rufen  Alka-Seltzer plus Cold Medicine Cosprin Ketoprofen S-A-C Tablets  Anacin Analgesic Tablets or Capsules Coumadin Korlgesic Salflex  Anacin Extra Strength Analgesic tablets or capsules CP-2 Tablets Lanoril Salicylate  Anaprox Cuprimine Capsules Levenox Salocol  Anexsia-D Dalteparin Magan Salsalate  Anodynos Darvon compound Magnesium Salicylate Sine-off  Ansaid Dasin Capsules Magsal Sodium Salicylate  Anturane  Depen Capsules Marnal Soma  APF Arthritis pain formula Dewitt's Pills Measurin Stanback  Argesic Dia-Gesic Meclofenamic Sulfinpyrazone  Arthritis Bayer Timed Release Aspirin Diclofenac Meclomen Sulindac  Arthritis pain formula Anacin Dicumarol Medipren Supac  Analgesic (Safety coated) Arthralgen Diffunasal Mefanamic Suprofen  Arthritis Strength Bufferin Dihydrocodeine Mepro Compound Suprol  Arthropan liquid Dopirydamole Methcarbomol with Aspirin Synalgos  ASA tablets/Enseals Disalcid Micrainin Tagament  Ascriptin Doan's Midol Talwin  Ascriptin A/D Dolene Mobidin Tanderil  Ascriptin Extra Strength Dolobid Moblgesic Ticlid  Ascriptin with Codeine Doloprin or Doloprin with Codeine Momentum Tolectin  Asperbuf Duoprin Mono-gesic Trendar  Aspergum Duradyne Motrin or Motrin IB Triminicin  Aspirin plain, buffered or enteric coated Durasal Myochrisine Trigesic  Aspirin Suppositories Easprin Nalfon Trillsate  Aspirin with Codeine Ecotrin Regular or Extra Strength Naprosyn Uracel  Atromid-S Efficin Naproxen Ursinus  Auranofin Capsules Elmiron Neocylate Vanquish  Axotal Emagrin Norgesic Verin  Azathioprine Empirin or Empirin with Codeine Normiflo Vitamin E  Azolid Emprazil Nuprin Voltaren  Bayer Aspirin plain, buffered or children's or timed BC Tablets or powders Encaprin Orgaran Warfarin Sodium  Buff-a-Comp Enoxaparin Orudis Zorpin  Buff-a-Comp with Codeine Equegesic Os-Cal-Gesic   Buffaprin Excedrin plain, buffered or Extra Strength Oxalid   Bufferin Arthritis Strength Feldene Oxphenbutazone   Bufferin plain or Extra Strength Feldene Capsules Oxycodone with Aspirin   Bufferin with Codeine Fenoprofen Fenoprofen Pabalate or Pabalate-SF   Buffets II Flogesic Panagesic   Buffinol plain or Extra Strength Florinal or Florinal with Codeine Panwarfarin   Buf-Tabs Flurbiprofen Penicillamine   Butalbital Compound Four-way cold tablets Penicillin   Butazolidin Fragmin Pepto-Bismol   Carbenicillin  Geminisyn Percodan   Carna Arthritis Reliever Geopen Persantine   Carprofen Gold's salt Persistin   Chloramphenicol Goody's Phenylbutazone   Chloromycetin Haltrain Piroxlcam   Clmetidine heparin Plaquenil   Cllnoril Hyco-pap Ponstel   Clofibrate Hydroxy chloroquine Propoxyphen         Before stopping any of these medications, be sure to consult the physician who ordered them.  Some, such as Coumadin (Warfarin) are ordered to prevent or treat serious conditions such as "deep thrombosis", "pumonary embolisms", and other heart problems.  The amount of time that you may need off of the medication may also vary with the medication and the reason for which you were taking it.  If you are taking any of these medications, please make sure you notify your pain physician before you undergo any procedures.         Selective Nerve Root  Block Patient Information  Description: Specific nerve roots exit the spinal canal and these nerves can be compressed and inflamed by a bulging disc and bone spurs.  By injecting steroids on the nerve root, we can potentially decrease the inflammation surrounding these nerves, which often leads to decreased pain.  Also, by injecting local anesthesia on the nerve root, this can provide [place] helpful information to give to your referring doctor if it decreases your pain.  Selective nerve root blocks can be done along the spine from the neck to the low back depending on the location of your pain.   After numbing the skin with local anesthesia, a small needle is passed to the nerve root and the position of the needle is verified using x-ray pictures.  After the needle is in correct position, we then deposit the medication.  You may experience a pressure sensation while this is being done.  The entire block usually lasts less than 15 minutes.  Conditions that may be treated with selective nerve root blocks:  Low back and leg pain  Spinal stenosis  Diagnostic block prior to  potential surgery  Neck and arm pain  Post laminectomy syndrome  Preparation for the injection:  1. Do not eat any solid food or dairy products within 8 hours of your appointment. 2. You may drink clear liquids up to 3 hours before an appointment.  Clear liquids include water, black coffee, juice or soda.  No milk or cream please. 3. You may take your regular medications, including pain medications, with a sip of water before your appointment.  Diabetics should hold regular insulin (if taken separately) and take 1/2 normal NPH dose the morning of the procedure.  Carry some sugar containing items with you to your appointment. 4. A driver must accompany you and be prepared to drive you home after your procedure. 5. Bring all your current medications with you. 6. An IV may be inserted and sedation may be given at the discretion of the physician. 7. A blood pressure cuff, EKG, and other monitors will often be applied during the procedure.  Some patients may need to have extra oxygen administered for a short period. 8. You will be asked to provide medical information, including allergies, prior to the procedure.  We must know immediately if you are taking blood  Thinners (like Coumadin) or if you are allergic to IV iodine contrast (dye).  Possible side-effects: All are usually temporary  Bleeding from needle site  Light headedness  Numbness and tingling  Decreased blood pressure  Weakness in arms/legs  Pressure sensation in back/neck  Pain at injection site (several days)  Possible complications: All are extremely rare  Infection  Nerve injury  Spinal headache (a headache wore with upright position)  Call if you experience:  Fever/chills associated with headache or increased back/neck pain  Headache worsened by an upright position  New onset weakness or numbness of an extremity below the injection site  Hives or difficulty breathing (go to the emergency  room)  Inflammation or drainage at the injection site(s)  Severe back/neck pain greater than usual  New symptoms which are concerning to you  Please note:  Although the local anesthetic injected can often make your back or neck feel good for several hours after the injection the pain will likely return.  It takes 3-5 days for steroids to work on the nerve root. You may not notice any pain relief for at least one week.  If effective, we will  often do a series of 3 injections spaced 3-6 weeks apart to maximally decrease your pain.    If you have any questions, please call [phone] [facility]    DISCONTINUE NEURONTIN. START TOPAMAX AS PRESCRIBED. BE CAUTIOUS AS TO SIDE EFFECTS OF TOPAMAX- AS DISCUSSED WITH DR Zelie Asbill.

## 2015-06-09 ENCOUNTER — Other Ambulatory Visit: Payer: Self-pay | Admitting: Pain Medicine

## 2015-06-12 ENCOUNTER — Ambulatory Visit: Payer: BLUE CROSS/BLUE SHIELD | Attending: Pain Medicine | Admitting: Pain Medicine

## 2015-06-12 ENCOUNTER — Encounter: Payer: Self-pay | Admitting: Pain Medicine

## 2015-06-12 VITALS — BP 122/76 | HR 72 | Temp 97.2°F | Resp 14 | Ht 64.0 in | Wt 170.0 lb

## 2015-06-12 DIAGNOSIS — M5481 Occipital neuralgia: Secondary | ICD-10-CM

## 2015-06-12 DIAGNOSIS — M79606 Pain in leg, unspecified: Secondary | ICD-10-CM | POA: Diagnosis present

## 2015-06-12 DIAGNOSIS — G43101 Migraine with aura, not intractable, with status migrainosus: Secondary | ICD-10-CM

## 2015-06-12 DIAGNOSIS — G43109 Migraine with aura, not intractable, without status migrainosus: Secondary | ICD-10-CM

## 2015-06-12 DIAGNOSIS — M5136 Other intervertebral disc degeneration, lumbar region: Secondary | ICD-10-CM

## 2015-06-12 DIAGNOSIS — M5126 Other intervertebral disc displacement, lumbar region: Secondary | ICD-10-CM | POA: Diagnosis not present

## 2015-06-12 DIAGNOSIS — M48062 Spinal stenosis, lumbar region with neurogenic claudication: Secondary | ICD-10-CM

## 2015-06-12 DIAGNOSIS — M5416 Radiculopathy, lumbar region: Secondary | ICD-10-CM

## 2015-06-12 DIAGNOSIS — M533 Sacrococcygeal disorders, not elsewhere classified: Secondary | ICD-10-CM

## 2015-06-12 DIAGNOSIS — M545 Low back pain: Secondary | ICD-10-CM | POA: Diagnosis present

## 2015-06-12 DIAGNOSIS — M5030 Other cervical disc degeneration, unspecified cervical region: Secondary | ICD-10-CM

## 2015-06-12 MED ORDER — MIDAZOLAM HCL 5 MG/5ML IJ SOLN
INTRAMUSCULAR | Status: AC
  Administered 2015-06-12: 3 mg via INTRAVENOUS
  Filled 2015-06-12: qty 5

## 2015-06-12 MED ORDER — CEFUROXIME AXETIL 250 MG PO TABS: 250.0000 mg | ORAL_TABLET | Freq: Two times a day (BID) | ORAL | Status: DC

## 2015-06-12 MED ORDER — LACTATED RINGERS IV SOLN: 1000.0000 mL | INTRAVENOUS | Status: DC

## 2015-06-12 MED ORDER — BUPIVACAINE HCL (PF) 0.25 % IJ SOLN
INTRAMUSCULAR | Status: AC
  Administered 2015-06-12: 09:00:00
  Filled 2015-06-12: qty 30

## 2015-06-12 MED ORDER — FENTANYL CITRATE (PF) 100 MCG/2ML IJ SOLN: 100.0000 ug | Freq: Once | INTRAMUSCULAR | Status: DC

## 2015-06-12 MED ORDER — BUPIVACAINE HCL (PF) 0.25 % IJ SOLN: 30.0000 mL | Freq: Once | INTRAMUSCULAR | Status: DC

## 2015-06-12 MED ORDER — MIDAZOLAM HCL 5 MG/5ML IJ SOLN: 5.0000 mg | Freq: Once | INTRAMUSCULAR | Status: DC

## 2015-06-12 MED ORDER — TRIAMCINOLONE ACETONIDE 40 MG/ML IJ SUSP
INTRAMUSCULAR | Status: AC
  Administered 2015-06-12: 09:00:00
  Filled 2015-06-12: qty 1

## 2015-06-12 MED ORDER — FENTANYL CITRATE (PF) 100 MCG/2ML IJ SOLN
INTRAMUSCULAR | Status: AC
  Administered 2015-06-12: 100 ug via INTRAVENOUS
  Filled 2015-06-12: qty 2

## 2015-06-12 MED ORDER — TRIAMCINOLONE ACETONIDE 40 MG/ML IJ SUSP: 40.0000 mg | Freq: Once | INTRAMUSCULAR | Status: DC

## 2015-06-12 MED ORDER — CEFAZOLIN SODIUM 1-5 GM-% IV SOLN
1.0000 g | Freq: Once | INTRAVENOUS | Status: DC
Start: 2015-06-12 — End: 2015-07-04

## 2015-06-12 MED ORDER — LIDOCAINE HCL (PF) 1 % IJ SOLN: 10.0000 mL | Freq: Once | INTRAMUSCULAR | Status: DC

## 2015-06-12 MED ORDER — LIDOCAINE HCL (PF) 1 % IJ SOLN
INTRAMUSCULAR | Status: AC
  Administered 2015-06-12: 09:00:00
  Filled 2015-06-12: qty 5

## 2015-06-12 MED ORDER — CEFAZOLIN SODIUM 1 G IJ SOLR
INTRAMUSCULAR | Status: AC
  Administered 2015-06-12: 09:00:00 via INTRAVENOUS
  Filled 2015-06-12: qty 10

## 2015-06-12 MED ORDER — ORPHENADRINE CITRATE 30 MG/ML IJ SOLN
INTRAMUSCULAR | Status: AC
  Administered 2015-06-12: 09:00:00
  Filled 2015-06-12: qty 2

## 2015-06-12 MED ORDER — ORPHENADRINE CITRATE 30 MG/ML IJ SOLN: 60.0000 mg | Freq: Once | INTRAMUSCULAR | Status: DC

## 2015-06-12 NOTE — Patient Instructions (Addendum)
PLAN  Continue present medication hydrocodone acetaminophen and begin taking antibiotic Ceftin as prescribed. Please obtain your antibiotic Ceftin today and begin taking antibiotic today  F/U PCP Dr. Iona Beard for evaliation of  BP and general medical  condition.   F/U surgical evaluation. Surgical evaluation as discussed  F/U neurological evaluation. May consider PNCV/EMG studies and other studies pending follow-up evaluations  May consider radiofrequency rhizolysis or intraspinal procedures pending response to present treatment and F/U evaluation.  Patient to call [facility] should patient have concerns prior to scheduled return appointmentConstipation, Adult Constipation is when a person has fewer than three bowel movements a week, has difficulty having a bowel movement, or has stools that are dry, hard, or larger than normal. As people grow older, constipation is more common. A low-fiber diet, not taking in enough fluids, and taking certain medicines may make constipation worse.  CAUSES   Certain medicines, such as antidepressants, pain medicine, iron supplements, antacids, and water pills.   Certain diseases, such as diabetes, irritable bowel syndrome (IBS), thyroid disease, or depression.   Not drinking enough water.   Not eating enough fiber-rich foods.   Stress or travel.   Lack of physical activity or exercise.   Ignoring the urge to have a bowel movement.   Using laxatives too much.  SIGNS AND SYMPTOMS   Having fewer than three bowel movements a week.   Straining to have a bowel movement.   Having stools that are hard, dry, or larger than normal.   Feeling full or bloated.   Pain in the lower abdomen.   Not feeling relief after having a bowel movement.  DIAGNOSIS  Your health care provider will take a medical history and perform a physical exam. Further testing may be done for severe constipation. Some tests may include:  A barium enema  X-ray to examine your rectum, colon, and, sometimes, your small intestine.   A sigmoidoscopy to examine your lower colon.   A colonoscopy to examine your entire colon. TREATMENT  Treatment will depend on the severity of your constipation and what is causing it. Some dietary treatments include drinking more fluids and eating more fiber-rich foods. Lifestyle treatments may include regular exercise. If these diet and lifestyle recommendations do not help, your health care provider may recommend taking over-the-counter laxative medicines to help you have bowel movements. Prescription medicines may be prescribed if over-the-counter medicines do not work.  HOME CARE INSTRUCTIONS   Eat foods that have a lot of fiber, such as fruits, vegetables, whole grains, and beans.  Limit foods high in fat and processed sugars, such as french fries, hamburgers, cookies, candies, and soda.   A fiber supplement may be added to your diet if you cannot get enough fiber from foods.   Drink enough fluids to keep your urine clear or pale yellow.   Exercise regularly or as directed by your health care provider.   Go to the restroom when you have the urge to go. Do not hold it.   Only take over-the-counter or prescription medicines as directed by your health care provider. Do not take other medicines for constipation without talking to your health care provider first.  [place] IF:   You have bright red blood in your stool.   Your constipation lasts for more than 4 days or gets worse.   You have abdominal or rectal pain.   You have thin, pencil-like stools.   You have unexplained weight loss. MAKE SURE YOU:  Understand these instructions.  Will watch your condition.  Will get help right away if you are not doing well or get worse.   This information is not intended to replace advice given to you by your health care provider. Make sure you discuss any questions you have  with your health care provider.   Document Released: 12/01/2003 Document Revised: 03/25/2014 Document Reviewed: 12/14/2012 Elsevier Interactive Patient Education [employer].

## 2015-06-12 NOTE — Progress Notes (Signed)
Safety precautions to be maintained throughout the outpatient stay will include: orient to surroundings, keep bed in low position, maintain call bell within reach at all times, provide assistance with transfer out of bed and ambulation.  

## 2015-06-12 NOTE — Progress Notes (Signed)
Subjective:    [name], female    DOB: 1955/06/25, 60 y.o.   MRN: [id number]  HPI  PROCEDURE PERFORMED: Lumbosacral selective nerve root block   NOTE: The patient is a 60 y.o. female who returns to [facility] for further evaluation and treatment of pain involving the lumbar and lower extremity region. Studies consisting of MRI has revealed the patient to be with evidence of  degenerative disc disease lumbar spine Shallow rightward disc protrusion with mild right subarticular narrowing. Mild facet hypertrophy L5-S1 without significant foraminal stenosis. There is concern regarding intraspinal abnormalities contributing to the patient's symptomatology. 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 We will proceed with interventional treatment as discussed and as explained to the patient. The patient is understanding and in agreement with suggested treatment plan.   DESCRIPTION OF PROCEDURE: Lumbosacral selective nerve root block with IV Versed, IV fentanyl conscious sedation, EKG, blood pressure, pulse, and pulse oximetry monitoring. The procedure was performed with the patient in the prone position under fluoroscopic guidance. With the patient in the prone position, Betadine prep of proposed entry site was performed. Local anesthetic skin wheal of proposed needle entry site was prepared with 1.5% plain lidocaine with AP view of the lumbosacral spine.   PROCEDURE #1: Needle placement at the right L 2 vertebral body: A 22 -gauge needle was inserted at the inferior border of the transverse process of the vertebral body with needle placed medial to the midline of the transverse process on AP view of the lumbosacral spine.   NEEDLE  PLACEMENT AT  L3, L4, and L5  VERTEBRAL BODY LEVELS  Needle  placement was accomplished at L3, L4, and L5  vertebral body levels on the right side exactly as was accomplished at the L2  vertebral body level  and utilizing the same technique and under fluoroscopic guidance.     Needle placement was then verified on lateral view at all levels with needle tip documented to be in the posterior superior quadrant of the intervertebral foramen of  L 2, L3, L4, and L5, and needle tip documented at the level of the S1 foramen. Following negative aspiration for heme and CSF at each level, each level was injected with 3 mL of 0.25% bupivacaine with Kenalog.      The patient tolerated the procedure well. A total of 10 mg of Kenalog was utilized for the procedure.   PLAN:  1. Medications: Will continue presently prescribed medications. 2. The patient is to undergo follow-up evaluation with PCP Dr. Cleon Dew for evaluation of blood pressure and general medical condition status post procedure performed on today's visit. 3. Surgical follow-up evaluation. Has been addressed 4. Neurological evaluation. May consider PNCV/EMG studies and other studies 5. May consider radiofrequency procedures, implantation type procedures and other treatment pending response to treatment and follow-up evaluation. 6. The patient has been advise do adhere to proper body mechanics and avoid activities which may aggravate condition. 7. The patient has been advised to call the [facility] prior to scheduled return appointment should there be significant change in the patient's condition or should the patient have other concerns regarding condition prior to scheduled return appointment.   Review of Systems     Objective:   Physical Exam  Assessment & Plan:

## 2015-06-13 ENCOUNTER — Telehealth: Payer: Self-pay | Admitting: *Deleted

## 2015-06-13 NOTE — Telephone Encounter (Signed)
No problems post procedure. 

## 2015-06-27 ENCOUNTER — Ambulatory Visit: Payer: Self-pay | Admitting: Podiatry

## 2015-06-29 ENCOUNTER — Encounter: Payer: Self-pay | Admitting: Podiatry

## 2015-06-29 ENCOUNTER — Ambulatory Visit (INDEPENDENT_AMBULATORY_CARE_PROVIDER_SITE_OTHER): Payer: BLUE CROSS/BLUE SHIELD | Admitting: Podiatry

## 2015-06-29 ENCOUNTER — Ambulatory Visit (INDEPENDENT_AMBULATORY_CARE_PROVIDER_SITE_OTHER): Payer: BLUE CROSS/BLUE SHIELD

## 2015-06-29 DIAGNOSIS — M205X9 Other deformities of toe(s) (acquired), unspecified foot: Secondary | ICD-10-CM | POA: Diagnosis not present

## 2015-06-29 DIAGNOSIS — M2040 Other hammer toe(s) (acquired), unspecified foot: Secondary | ICD-10-CM | POA: Diagnosis not present

## 2015-06-29 DIAGNOSIS — M2010 Hallux valgus (acquired), unspecified foot: Secondary | ICD-10-CM

## 2015-06-29 DIAGNOSIS — R52 Pain, unspecified: Secondary | ICD-10-CM

## 2015-06-29 NOTE — Progress Notes (Signed)
   Subjective:    [name], female    DOB: 11/28/1955, 60 y.o.   MRN: [id number]  HPI  60 year old female presents the also concerns of bilateral bunions as well as her second toe crossing over her big toe left side worse than the right. His been ongoing for several years and has been progressive. She has tried shoe gear changes, offloading padding without any relief. She denies any numbness or tingling. The areas painful most of pressure in shoe gear and after standing for long periods of time. At this time so discussed other options. No other complaints at this time.  Review of Systems  All other systems reviewed and are negative.      Objective:   Physical Exam General: AAO x3, NAD  Dermatological: Skin is warm, dry and supple bilateral. Nails x 10 are well manicured; remaining integument appears unremarkable at this time. There are no open sores, no preulcerative lesions, no rash or signs of infection present.  Vascular: Dorsalis Pedis artery and Posterior Tibial artery pedal pulses are 2/4 bilateral with immedate capillary fill time. Pedal hair growth present. No varicosities and no lower extremity edema present bilateral. There is no pain with calf compression, swelling, warmth, erythema.   Neruologic: Grossly intact via light touch bilateral. Vibratory intact via tuning fork bilateral. Protective threshold with Semmes Wienstein monofilament intact to all pedal sites bilateral. Patellar and Achilles deep tendon reflexes 2+ bilateral. No Babinski or clonus noted bilateral.   Musculoskeletal:  Moderate HAV present bilaterally with mild tenderness palpation medial aspect of the first metatarsal head with a left side worse than right. There is no hypermobility. There is no pain or crepitation first MTPJ range of motion. On the left side greater than the right the second toe does overlap the hallux. There is hammertoe contractures present. There is no other areas of  tenderness to bilateral lower extremities. MMT 5/5, ROM WNL   Gait: Unassisted, Nonantalgic.      Assessment & Plan:  60 year old female with left greater than right bunion, hammertoe, overlapping second toe. -Treatment options discussed including all alternatives, risks, and complications -Etiology of symptoms were discussed -X-rays were obtained and reviewed with the patient.  -At this time a discussed both conservative and surgical treatment options. She is attended multiple conservative treatment options I discussed with her surgical intervention today. I discussed her likely Austin/Akin bunionectomy with hammertoe repair 2 and 3 and MTPJ capsulotomy the second toe. She'll consider her options. She'll likely undergo this in the near future. Also discussed with her medical clearance as well as to discuss pain management. She is a plantar palms of both these physicians. -Follow-up in the near future at her timeframe or sooner if any problems arise. In the meantime, encouraged to call the office with any questions, concerns, change in symptoms.   Celesta Gentile, DPM

## 2015-07-02 DIAGNOSIS — M2040 Other hammer toe(s) (acquired), unspecified foot: Secondary | ICD-10-CM | POA: Insufficient documentation

## 2015-07-02 DIAGNOSIS — M2010 Hallux valgus (acquired), unspecified foot: Secondary | ICD-10-CM | POA: Insufficient documentation

## 2015-07-02 DIAGNOSIS — M205X9 Other deformities of toe(s) (acquired), unspecified foot: Secondary | ICD-10-CM | POA: Insufficient documentation

## 2015-07-04 ENCOUNTER — Encounter: Payer: Self-pay | Admitting: Pain Medicine

## 2015-07-04 ENCOUNTER — Ambulatory Visit: Payer: PRIVATE HEALTH INSURANCE | Admitting: Urology

## 2015-07-04 ENCOUNTER — Ambulatory Visit: Payer: BLUE CROSS/BLUE SHIELD | Attending: Pain Medicine | Admitting: Pain Medicine

## 2015-07-04 VITALS — BP 131/81 | HR 85 | Temp 98.3°F | Resp 14 | Ht 65.0 in | Wt 165.0 lb

## 2015-07-04 DIAGNOSIS — M5481 Occipital neuralgia: Secondary | ICD-10-CM

## 2015-07-04 DIAGNOSIS — G43109 Migraine with aura, not intractable, without status migrainosus: Secondary | ICD-10-CM

## 2015-07-04 DIAGNOSIS — M48062 Spinal stenosis, lumbar region with neurogenic claudication: Secondary | ICD-10-CM

## 2015-07-04 DIAGNOSIS — M5126 Other intervertebral disc displacement, lumbar region: Secondary | ICD-10-CM | POA: Diagnosis not present

## 2015-07-04 DIAGNOSIS — M533 Sacrococcygeal disorders, not elsewhere classified: Secondary | ICD-10-CM

## 2015-07-04 DIAGNOSIS — M4806 Spinal stenosis, lumbar region: Secondary | ICD-10-CM | POA: Diagnosis not present

## 2015-07-04 DIAGNOSIS — M5116 Intervertebral disc disorders with radiculopathy, lumbar region: Secondary | ICD-10-CM | POA: Insufficient documentation

## 2015-07-04 DIAGNOSIS — Z9889 Other specified postprocedural states: Secondary | ICD-10-CM | POA: Insufficient documentation

## 2015-07-04 DIAGNOSIS — M545 Low back pain: Secondary | ICD-10-CM | POA: Diagnosis present

## 2015-07-04 DIAGNOSIS — M5416 Radiculopathy, lumbar region: Secondary | ICD-10-CM

## 2015-07-04 DIAGNOSIS — M51369 Other intervertebral disc degeneration, lumbar region without mention of lumbar back pain or lower extremity pain: Secondary | ICD-10-CM

## 2015-07-04 DIAGNOSIS — M79606 Pain in leg, unspecified: Secondary | ICD-10-CM | POA: Diagnosis present

## 2015-07-04 DIAGNOSIS — G43101 Migraine with aura, not intractable, with status migrainosus: Secondary | ICD-10-CM

## 2015-07-04 DIAGNOSIS — M5136 Other intervertebral disc degeneration, lumbar region: Secondary | ICD-10-CM

## 2015-07-04 DIAGNOSIS — M5030 Other cervical disc degeneration, unspecified cervical region: Secondary | ICD-10-CM

## 2015-07-04 MED ORDER — TOPIRAMATE 25 MG PO TABS: ORAL_TABLET | ORAL | Status: DC

## 2015-07-04 MED ORDER — HYDROCODONE-ACETAMINOPHEN 7.5-325 MG PO TABS: ORAL_TABLET | ORAL | Status: DC

## 2015-07-04 NOTE — Progress Notes (Signed)
Scheduled to have bunion foot surgery in August 2017

## 2015-07-04 NOTE — Patient Instructions (Addendum)
PLAN   Continue present medication hydrocodone acetaminophen and Topamax NO NEURONTIN  . Topamax is similar to Neurontin and can cause drowsiness, confusion, and excessive sedation. Be very careful when taking Topamax to avoid these undesirable side effects  Lumbosacral selective nerve root block to be performed at time of return appointment  F/U PCP Dr. Iona Beard for evaliation of  BP and general medical  condition  F/U surgical evaluation. May consider further surgical evaluation  F/U neurological evaluation. May consider PN[facility] EMG studies and other studies  May consider radiofrequency rhizolysis or intraspinal procedures pending response to present treatment and F/U evaluation   Patient to call [facility] should patient have concerns prior to scheduled return appointment.  Selective Nerve Root Block Patient Information  Description: Specific nerve roots exit the spinal canal and these nerves can be compressed and inflamed by a bulging disc and bone spurs.  By injecting steroids on the nerve root, we can potentially decrease the inflammation surrounding these nerves, which often leads to decreased pain.  Also, by injecting local anesthesia on the nerve root, this can provide [place] helpful information to give to your referring doctor if it decreases your pain.  Selective nerve root blocks can be done along the spine from the neck to the low back depending on the location of your pain.   After numbing the skin with local anesthesia, a small needle is passed to the nerve root and the position of the needle is verified using x-ray pictures.  After the needle is in correct position, we then deposit the medication.  You may experience a pressure sensation while this is being done.  The entire block usually lasts less than 15 minutes.  Conditions that may be treated with selective nerve root blocks:  Low back and leg pain  Spinal stenosis  Diagnostic block prior to potential  surgery  Neck and arm pain  Post laminectomy syndrome  Preparation for the injection:  1. Do not eat any solid food or dairy products within 8 hours of your appointment. 2. You may drink clear liquids up to 3 hours before an appointment.  Clear liquids include water, black coffee, juice or soda.  No milk or cream please. 3. You may take your regular medications, including pain medications, with a sip of water before your appointment.  Diabetics should hold regular insulin (if taken separately) and take 1/2 normal NPH dose the morning of the procedure.  Carry some sugar containing items with you to your appointment. 4. A driver must accompany you and be prepared to drive you home after your procedure. 5. Bring all your current medications with you. 6. An IV may be inserted and sedation may be given at the discretion of the physician. 7. A blood pressure cuff, EKG, and other monitors will often be applied during the procedure.  Some patients may need to have extra oxygen administered for a short period. 8. You will be asked to provide medical information, including allergies, prior to the procedure.  We must know immediately if you are taking blood  Thinners (like Coumadin) or if you are allergic to IV iodine contrast (dye).  Possible side-effects: All are usually temporary  Bleeding from needle site  Light headedness  Numbness and tingling  Decreased blood pressure  Weakness in arms/legs  Pressure sensation in back/neck  Pain at injection site (several days)  Possible complications: All are extremely rare  Infection  Nerve injury  Spinal headache (a headache wore with upright position)  Call if you experience:  Fever/chills associated with headache or increased back/neck pain  Headache worsened by an upright position  New onset weakness or numbness of an extremity below the injection site  Hives or difficulty breathing (go to the emergency room)  Inflammation or  drainage at the injection site(s)  Severe back/neck pain greater than usual  New symptoms which are concerning to you  Please note:  Although the local anesthetic injected can often make your back or neck feel good for several hours after the injection the pain will likely return.  It takes 3-5 days for steroids to work on the nerve root. You may not notice any pain relief for at least one week.  If effective, we will often do a series of 3 injections spaced 3-6 weeks apart to maximally decrease your pain.    If you have any questions, please call [phone] [facility]  A prescription for TOPAMAX was sent to your pharmacy and should be available for pickup today.  A prescription for HYDROCODONE was given to you today.GENERAL RISKS AND COMPLICATIONS  What are the risk, side effects and possible complications? Generally speaking, most procedures are safe.  However, with any procedure there are risks, side effects, and the possibility of complications.  The risks and complications are dependent upon the sites that are lesioned, or the type of nerve block to be performed.  The closer the procedure is to the spine, the more serious the risks are.  Great care is taken when placing the radio frequency needles, block needles or lesioning probes, but sometimes complications can occur. 1. Infection: Any time there is an injection through the skin, there is a risk of infection.  This is why sterile conditions are used for these blocks.  There are four possible types of infection. 1. Localized skin infection. 2. Central Nervous System Infection-This can be in the form of Meningitis, which can be deadly. 3. Epidural Infections-This can be in the form of an epidural abscess, which can cause pressure inside of the spine, causing compression of the spinal cord with subsequent paralysis. This would require an emergency surgery to decompress, and there are no guarantees  that the patient would recover from the paralysis. 4. Discitis-This is an infection of the intervertebral discs.  It occurs in about 1% of discography procedures.  It is difficult to treat and it may lead to surgery.        2. Pain: the needles have to go through skin and soft tissues, will cause soreness.       3. Damage to internal structures:  The nerves to be lesioned may be near blood vessels or    other nerves which can be potentially damaged.       4. Bleeding: Bleeding is more common if the patient is taking blood thinners such as  aspirin, Coumadin, Ticiid, Plavix, etc., or if he/she have some genetic predisposition  such as hemophilia. Bleeding into the spinal canal can cause compression of the spinal  cord with subsequent paralysis.  This would require an emergency surgery to  decompress and there are no guarantees that the patient would recover from the  paralysis.       5. Pneumothorax:  Puncturing of a lung is a possibility, every time a needle is introduced in  the area of the chest or upper back.  Pneumothorax refers to free air around the  collapsed lung(s), inside of the thoracic cavity (chest cavity).  Another  two possible  complications related to a similar event would include: Hemothorax and Chylothorax.   These are variations of the Pneumothorax, where instead of air around the collapsed  lung(s), you may have blood or chyle, respectively.       6. Spinal headaches: They may occur with any procedures in the area of the spine.       7. Persistent CSF (Cerebro-Spinal Fluid) leakage: This is a rare problem, but may occur  with prolonged intrathecal or epidural catheters either due to the formation of a fistulous  track or a dural tear.       8. Nerve damage: By working so close to the spinal cord, there is always a possibility of  nerve damage, which could be as serious as a permanent spinal cord injury with  paralysis.       9. Death:  Although rare, severe deadly allergic reactions  known as "Anaphylactic  reaction" can occur to any of the medications used.      10. Worsening of the symptoms:  We can always make thing worse.  What are the chances of something like this happening? Chances of any of this occuring are extremely low.  By statistics, you have more of a chance of getting killed in a motor vehicle accident: while driving to the hospital than any of the above occurring .  Nevertheless, you should be aware that they are possibilities.  In general, it is similar to taking a shower.  Everybody knows that you can slip, hit your head and get killed.  Does that mean that you should not shower again?  Nevertheless always keep in mind that statistics do not mean anything if you happen to be on the wrong side of them.  Even if a procedure has a 1 (one) in a 1,000,000 (million) chance of going wrong, it you happen to be that one..Also, keep in mind that by statistics, you have more of a chance of having something go wrong when taking medications.  Who should not have this procedure? If you are on a blood thinning medication (e.g. Coumadin, Plavix, see list of "Blood Thinners"), or if you have an active infection going on, you should not have the procedure.  If you are taking any blood thinners, please inform your physician.  How should I prepare for this procedure?  Do not eat or drink anything at least six hours prior to the procedure.  Bring a driver with you .  It cannot be a taxi.  Come accompanied by an adult that can drive you back, and that is strong enough to help you if your legs get weak or numb from the local anesthetic.  Take all of your medicines the morning of the procedure with just enough water to swallow them.  If you have diabetes, make sure that you are scheduled to have your procedure done first thing in the morning, whenever possible.  If you have diabetes, take only half of your insulin dose and notify our nurse that you have done so as soon as you  arrive at the clinic.  If you are diabetic, but only take blood sugar pills (oral hypoglycemic), then do not take them on the morning of your procedure.  You may take them after you have had the procedure.  Do not take aspirin or any aspirin-containing medications, at least eleven (11) days prior to the procedure.  They may prolong bleeding.  Wear loose fitting clothing that may be easy to take  off and that you would not mind if it got stained with Betadine or blood.  Do not wear any jewelry or perfume  Remove any nail coloring.  It will interfere with some of our monitoring equipment.  NOTE: Remember that this is not meant to be interpreted as a complete list of all possible complications.  Unforeseen problems may occur.  BLOOD THINNERS The following drugs contain aspirin or other products, which can cause increased bleeding during surgery and should not be taken for 2 weeks prior to and 1 week after surgery.  If you should need take something for relief of minor pain, you may take acetaminophen which is found in Tylenol,m Datril, Anacin-3 and Panadol. It is not blood thinner. The products listed below are.  Do not take any of the products listed below in addition to any listed on your instruction sheet.  A.P.C or A.P.C with Codeine Codeine Phosphate Capsules #3 Ibuprofen Ridaura  ABC compound Congesprin Imuran rimadil  Advil Cope Indocin Robaxisal  Alka-Seltzer Effervescent Pain Reliever and Antacid Coricidin or Coricidin-D  Indomethacin Rufen  Alka-Seltzer plus Cold Medicine Cosprin Ketoprofen S-A-C Tablets  Anacin Analgesic Tablets or Capsules Coumadin Korlgesic Salflex  Anacin Extra Strength Analgesic tablets or capsules CP-2 Tablets Lanoril Salicylate  Anaprox Cuprimine Capsules Levenox Salocol  Anexsia-D Dalteparin Magan Salsalate  Anodynos Darvon compound Magnesium Salicylate Sine-off  Ansaid Dasin Capsules Magsal Sodium Salicylate  Anturane Depen Capsules Marnal Soma  APF  Arthritis pain formula Dewitt's Pills Measurin Stanback  Argesic Dia-Gesic Meclofenamic Sulfinpyrazone  Arthritis Bayer Timed Release Aspirin Diclofenac Meclomen Sulindac  Arthritis pain formula Anacin Dicumarol Medipren Supac  Analgesic (Safety coated) Arthralgen Diffunasal Mefanamic Suprofen  Arthritis Strength Bufferin Dihydrocodeine Mepro Compound Suprol  Arthropan liquid Dopirydamole Methcarbomol with Aspirin Synalgos  ASA tablets/Enseals Disalcid Micrainin Tagament  Ascriptin Doan's Midol Talwin  Ascriptin A/D Dolene Mobidin Tanderil  Ascriptin Extra Strength Dolobid Moblgesic Ticlid  Ascriptin with Codeine Doloprin or Doloprin with Codeine Momentum Tolectin  Asperbuf Duoprin Mono-gesic Trendar  Aspergum Duradyne Motrin or Motrin IB Triminicin  Aspirin plain, buffered or enteric coated Durasal Myochrisine Trigesic  Aspirin Suppositories Easprin Nalfon Trillsate  Aspirin with Codeine Ecotrin Regular or Extra Strength Naprosyn Uracel  Atromid-S Efficin Naproxen Ursinus  Auranofin Capsules Elmiron Neocylate Vanquish  Axotal Emagrin Norgesic Verin  Azathioprine Empirin or Empirin with Codeine Normiflo Vitamin E  Azolid Emprazil Nuprin Voltaren  Bayer Aspirin plain, buffered or children's or timed BC Tablets or powders Encaprin Orgaran Warfarin Sodium  Buff-a-Comp Enoxaparin Orudis Zorpin  Buff-a-Comp with Codeine Equegesic Os-Cal-Gesic   Buffaprin Excedrin plain, buffered or Extra Strength Oxalid   Bufferin Arthritis Strength Feldene Oxphenbutazone   Bufferin plain or Extra Strength Feldene Capsules Oxycodone with Aspirin   Bufferin with Codeine Fenoprofen Fenoprofen Pabalate or Pabalate-SF   Buffets II Flogesic Panagesic   Buffinol plain or Extra Strength Florinal or Florinal with Codeine Panwarfarin   Buf-Tabs Flurbiprofen Penicillamine   Butalbital Compound Four-way cold tablets Penicillin   Butazolidin Fragmin Pepto-Bismol   Carbenicillin Geminisyn Percodan   Carna Arthritis  Reliever Geopen Persantine   Carprofen Gold's salt Persistin   Chloramphenicol Goody's Phenylbutazone   Chloromycetin Haltrain Piroxlcam   Clmetidine heparin Plaquenil   Cllnoril Hyco-pap Ponstel   Clofibrate Hydroxy chloroquine Propoxyphen         Before stopping any of these medications, be sure to consult the physician who ordered them.  Some, such as Coumadin (Warfarin) are ordered to prevent or treat serious conditions such as "deep thrombosis", "pumonary embolisms",  and other heart problems.  The amount of time that you may need off of the medication may also vary with the medication and the reason for which you were taking it.  If you are taking any of these medications, please make sure you notify your pain physician before you undergo any procedures.         Selective Nerve Root Block Patient Information  Description: Specific nerve roots exit the spinal canal and these nerves can be compressed and inflamed by a bulging disc and bone spurs.  By injecting steroids on the nerve root, we can potentially decrease the inflammation surrounding these nerves, which often leads to decreased pain.  Also, by injecting local anesthesia on the nerve root, this can provide [place] helpful information to give to your referring doctor if it decreases your pain.  Selective nerve root blocks can be done along the spine from the neck to the low back depending on the location of your pain.   After numbing the skin with local anesthesia, a small needle is passed to the nerve root and the position of the needle is verified using x-ray pictures.  After the needle is in correct position, we then deposit the medication.  You may experience a pressure sensation while this is being done.  The entire block usually lasts less than 15 minutes.  Conditions that may be treated with selective nerve root blocks:  Low back and leg pain  Spinal stenosis  Diagnostic block prior to potential surgery  Neck and arm  pain  Post laminectomy syndrome  Preparation for the injection:  9. Do not eat any solid food or dairy products within 8 hours of your appointment. 10. You may drink clear liquids up to 3 hours before an appointment.  Clear liquids include water, black coffee, juice or soda.  No milk or cream please. 11. You may take your regular medications, including pain medications, with a sip of water before your appointment.  Diabetics should hold regular insulin (if taken separately) and take 1/2 normal NPH dose the morning of the procedure.  Carry some sugar containing items with you to your appointment. 12. A driver must accompany you and be prepared to drive you home after your procedure. 11. Bring all your current medications with you. 14. An IV may be inserted and sedation may be given at the discretion of the physician. 15. A blood pressure cuff, EKG, and other monitors will often be applied during the procedure.  Some patients may need to have extra oxygen administered for a short period. 66. You will be asked to provide medical information, including allergies, prior to the procedure.  We must know immediately if you are taking blood  Thinners (like Coumadin) or if you are allergic to IV iodine contrast (dye).  Possible side-effects: All are usually temporary  Bleeding from needle site  Light headedness  Numbness and tingling  Decreased blood pressure  Weakness in arms/legs  Pressure sensation in back/neck  Pain at injection site (several days)  Possible complications: All are extremely rare  Infection  Nerve injury  Spinal headache (a headache wore with upright position)  Call if you experience:  Fever/chills associated with headache or increased back/neck pain  Headache worsened by an upright position  New onset weakness or numbness of an extremity below the injection site  Hives or difficulty breathing (go to the emergency room)  Inflammation or drainage at the  injection site(s)  Severe back/neck pain greater than usual  New  symptoms which are concerning to you  Please note:  Although the local anesthetic injected can often make your back or neck feel good for several hours after the injection the pain will likely return.  It takes 3-5 days for steroids to work on the nerve root. You may not notice any pain relief for at least one week.  If effective, we will often do a series of 3 injections spaced 3-6 weeks apart to maximally decrease your pain.    If you have any questions, please call [phone] [facility]

## 2015-07-04 NOTE — Progress Notes (Signed)
   Subjective:    [name], female    DOB: Aug 01, 1955, 60 y.o.   MRN: [id number]  HPI   The patient is a 60 year old female who returns to pain management for further evaluation and treatment of pain involving the lower back and lower extremity region predominantly the patient states that the pain is aggravated by standing walking and becomes more intense as the day progresses. The patient denies any trauma change in events of daily living to cause change in symptomatology. We discussed patient's condition. The patient is with prior surgery of the lumbar region without desire to consider additional surgery. We will proceed with lumbosacral selective nerve root block at time of return appointment in attempt to decrease severity of symptoms, minimize progression of symptoms, and avoid the need for more involved treatment. The patient states that she has had improvement of her pain with previous lumbosacral selective nerve blocks. The patient will continue Topamax and hydrocodone acetaminophen as prescribed    Review of Systems     Objective:   Physical Exam  There was tenderness over the splenius capitis and occipitalis musculature region of mild degree with mild tenderness over the cervical facet cervical paraspinal musculature region. Palpation over the region of the acromioclavicular and glenohumeral joint regions reproduced pain of mild degree with mild tenderness over the thoracic facet thoracic paraspinal musculature region. Palpation over the thoracic paraspinal musculature region was with evidence of muscle spasm of moderate degree in the lower thoracic region. There was unremarkable Spurling's maneuver. Tinel and Phalen's maneuver were without increased pain of significant degree. Palpation over the lumbar paraspinal musculatures and lumbar facet region was attends to palpation of moderate degree with lateral bending rotation extension and palpation of the lumbar  facets reproducing moderately severe discomfort. Straight leg raising was decreased on the right compared to the left. EHL strength appeared to be decreased on the right compared to the left. DTRs appeared to be trace at the knees. No definite sensory deficit or dermatomal distribution was detected. There was negative clonus negative Homans. Palpation of the PSIS and PII S region reproduced mild to moderate discomfort with mild tenderness along the greater trochanteric region and iliotibial band region. There was negative clonus negative Homans. Abdomen was nontender with no costovertebral angle tenderness noted      Assessment & Plan:    Degenerative disc disease lumbar spine Shallow rightward disc protrusion with mild right subarticular narrowing. Mild facet hypertrophy L5-S1 without significant foraminal stenosis  Lumbar stenosis with neurogenic claudication  Lumbar radiculopathy  Lumbar facet syndrome  Sacroiliac joint dysfunction  Bilateral occipital neuralgia    PLAN   Continue present medication hydrocodone acetaminophen and Topamax NO NEURONTIN  . Topamax is similar to Neurontin and can cause drowsiness, confusion, and excessive sedation. Be very careful when taking Topamax to avoid these undesirable side effects  Lumbosacral selective nerve root block to be performed at time of return appointment  F/U PCP Dr. Iona Beard for evaliation of  BP and general medical  condition  F/U surgical evaluation. May consider further surgical evaluation  F/U neurological evaluation. May consider PNCV EMG studies and other studies  May consider radiofrequency rhizolysis or intraspinal procedures pending response to present treatment and F/U evaluation   Patient to call Pain Management Center should patient have concerns prior to scheduled return appointment.

## 2015-07-06 ENCOUNTER — Telehealth: Payer: Self-pay | Admitting: *Deleted

## 2015-07-06 NOTE — Telephone Encounter (Signed)
"  Dr. Jacqualyn Posey told me to call you to schedule foot surgery.  I'm wondering if we can do it the first of August.  Give me a call."  I'm returning your call.  I can schedule you tentatively August 2nd.  Dr. Jacqualyn Posey said he wants medical clearance.  "I saw my doctor yesterday.  She said she has no problem giving the clearance.  She said normally someone from your office will send a medical clearance request.  I also spoke with Dr. Primus Bravo and informed him Dr. Jacqualyn Posey will want to switch me to Percocet and wanted to know if that's okay.  He said it shouldn't be a problem."  I can schedule you for a consultation with Dr. Jacqualyn Posey.  He can see you on May 18.  "That will be fine any date will be fine, whatever is good for you."  Okay, your appointment will be 08/03/2015 at 9:15am.  Who are your doctors we need to get clearance from.  "My pain management doctor is Dr. Primus Bravo at [facility] and the other doctor is Dr. Salvadore Farber with [facility] in [place]."

## 2015-07-11 ENCOUNTER — Ambulatory Visit (INDEPENDENT_AMBULATORY_CARE_PROVIDER_SITE_OTHER): Payer: BLUE CROSS/BLUE SHIELD | Admitting: Urology

## 2015-07-11 ENCOUNTER — Encounter: Payer: Self-pay | Admitting: Urology

## 2015-07-11 VITALS — BP 114/79 | HR 86 | Ht 65.0 in | Wt 166.0 lb

## 2015-07-11 DIAGNOSIS — Z87448 Personal history of other diseases of urinary system: Secondary | ICD-10-CM

## 2015-07-11 DIAGNOSIS — N952 Postmenopausal atrophic vaginitis: Secondary | ICD-10-CM

## 2015-07-11 DIAGNOSIS — R3129 Other microscopic hematuria: Secondary | ICD-10-CM

## 2015-07-11 DIAGNOSIS — R350 Frequency of micturition: Secondary | ICD-10-CM | POA: Diagnosis not present

## 2015-07-11 LAB — URINALYSIS, COMPLETE
Bilirubin, UA: NEGATIVE
GLUCOSE, UA: NEGATIVE
KETONES UA: NEGATIVE
Leukocytes, UA: NEGATIVE
NITRITE UA: NEGATIVE
PROTEIN UA: NEGATIVE
RBC, UA: NEGATIVE
SPEC GRAV UA: 1.01 (ref 1.005–1.030)
UUROB: 0.2 mg/dL (ref 0.2–1.0)
pH, UA: 6.5 (ref 5.0–7.5)

## 2015-07-11 LAB — MICROSCOPIC EXAMINATION
Bacteria, UA: NONE SEEN
RBC MICROSCOPIC, UA: NONE SEEN /HPF (ref 0–?)

## 2015-07-11 LAB — BLADDER SCAN AMB NON-IMAGING

## 2015-07-11 MED ORDER — MIRABEGRON ER 25 MG PO TB24: 25.0000 mg | ORAL_TABLET | Freq: Every day | ORAL | Status: DC

## 2015-07-11 NOTE — Progress Notes (Signed)
10:04 AM   Rachael Jordan Jun 29, 1955 [id number]  Referring provider: Sharyne Peach, MD [place], [place] [address]  Chief Complaint  Patient presents with  . Urinary Frequency    55month   HPI: Patient is a 60year old African-American female with a history of urinary frequency, gross hematuria and vaginal atrophy. She presents today for 6 month follow-up.    History of gross hematuria Negative hematuria workup completed 07/01/14 including CT urogram and cystoscopy.  She denies any further episodes of gross hematuria since her last visit.  Her UA today is negative for hematuria.  Urinary frequency She is currently taking daily Myrbetriq for management of her urinary frequency.   She had been out of the medication for 1 week and could tell there was a worsening of her urinary symptoms.   She reports that she is tolerating the medication well.  She does admit to only drinking a few sips of liquids per day. She states that she has no desire to drink.  Vaginal atrophy She has previously being treated for her vaginal atrophy with vaginal estrogen cream. She states that her dysuria completely resolved after using the cream that she has since discontinue the medication.  She does not desire to restart it at this time.  PMH: Past Medical History  Diagnosis Date  . Arthritis   . Lung abnormality     "damaged lung due to pneumonia"  . H. pylori infection   . Pneumonia   . Heart murmur   . Pneumothorax   . Scoliosis   . Neck pain   . Kidney failure   . Nephrolithiasis   . Anxiety   . Acid reflux   . Urinary frequency   . Vaginal atrophy   . Gross hematuria     Surgical History: Past Surgical History  Procedure Laterality Date  . Appendectomy    . Abdominal hysterectomy      vaginal  . Neck surgery      Home Medications:    Medication List       This list is accurate as of: 07/11/15 10:04 AM.  Always use your most recent med list.                azelastine 0.1 % nasal spray  Commonly known as:  ASTELIN  Place into the nose.     CENTRUM ADULTS PO  Take by mouth.     cetirizine 10 MG tablet  Commonly known as:  ZYRTEC  Take 10 mg by mouth daily.     HYDROcodone-acetaminophen 7.5-325 MG tablet  Commonly known as:  NORCO  Limit one tab by mouth per day or 2-4 times per day if tolerated     ipratropium-albuterol 0.5-2.5 (3) MG/3ML Soln  Commonly known as:  DUONEB  Take 3 mLs by nebulization every 4 (four) hours as needed.     mirabegron ER 25 MG Tb24 tablet  Commonly known as:  MYRBETRIQ  Take 1 tablet (25 mg total) by mouth daily.     montelukast 10 MG tablet  Commonly known as:  SINGULAIR  Take 10 mg by mouth daily.     pantoprazole 40 MG tablet  Commonly known as:  PROTONIX  Take 40 mg by mouth.     phentermine 15 MG capsule  Take 15 mg by mouth daily. Reported on 06/12/2015     promethazine 25 MG tablet  Commonly known as:  PHENERGAN  Take 25 mg by mouth every 6 (six)  hours as needed for nausea or vomiting.     tiZANidine 4 MG tablet  Commonly known as:  ZANAFLEX  Take 4 mg by mouth 3 (three) times daily.     topiramate 25 MG tablet  Commonly known as:  TOPAMAX  Limit 1 tablet by mouth per day or twice per day if tolerated  NO NEURONTIN     traZODone 100 MG tablet  Commonly known as:  DESYREL  Take 100 mg by mouth at bedtime.     vitamin C 500 MG tablet  Commonly known as:  ASCORBIC ACID  Take 500 mg by mouth daily.        Allergies:  Allergies  Allergen Reactions  . Grapefruit Bioflavonoid Complex Rash  . Propoxyphene Nausea And Vomiting and Nausea Only    Family History: Family History  Problem Relation Age of Onset  . Arthritis Mother   . Asthma Mother   . Hyperlipidemia Mother   . Hypertension Mother   . Diabetes Sister   . Cancer Maternal Aunt     esophagus    Social History:  reports that she has never smoked. She does not have any smokeless tobacco history on file. She  reports that she does not drink alcohol or use illicit drugs.  ROS: UROLOGY Frequent Urination?: No Hard to postpone urination?: No Burning/pain with urination?: No Get up at night to urinate?: Yes Leakage of urine?: No Urine stream starts and stops?: No Trouble starting stream?: Yes Do you have to strain to urinate?: No Blood in urine?: No Urinary tract infection?: No Sexually transmitted disease?: No Injury to kidneys or bladder?: No Painful intercourse?: No Weak stream?: No Currently pregnant?: No Vaginal bleeding?: No Last menstrual period?: n  Gastrointestinal Nausea?: No Vomiting?: No Indigestion/heartburn?: No Diarrhea?: No Constipation?: Yes  Constitutional Fever: No Night sweats?: No Weight loss?: No Fatigue?: No  Skin Skin rash/lesions?: No Itching?: No  Eyes Blurred vision?: No Double vision?: No  Ears/Nose/Throat Sore throat?: No Sinus problems?: Yes  Hematologic/Lymphatic Swollen glands?: No Easy bruising?: No  Cardiovascular Leg swelling?: No Chest pain?: No  Respiratory Cough?: No Shortness of breath?: No  Endocrine Excessive thirst?: No  Musculoskeletal Back pain?: No Joint pain?: No  Neurological Headaches?: No Dizziness?: No  Psychologic Depression?: No Anxiety?: No  Physical Exam: BP 114/79 mmHg  Pulse 86  Ht 5' 5"  (1.651 m)  Wt 166 lb (75.297 kg)  BMI 27.62 kg/m2  Constitutional:  Alert and oriented, No acute distress. HEENT: [place] AT, moist mucus membranes.  Trachea midline, no masses. Cardiovascular: No clubbing, cyanosis, or edema. Respiratory: Normal respiratory effort, no increased work of breathing. Skin: No rashes, bruises or suspicious lesions. Lymph: No cervical or inguinal adenopathy. Neurologic: Grossly intact, no focal deficits, moving all 4 extremities. Psychiatric: Normal mood and affect.  Laboratory Data:  Urinalysis Results for orders placed or performed in visit on 07/11/15  Microscopic  Examination  Result Value Ref Range   WBC, UA 0-5 0 -  5 /hpf   RBC, UA None seen 0 -  2 /hpf   Epithelial Cells (non renal) 0-10 0 - 10 /hpf   Bacteria, UA None seen None seen/Few  Urinalysis, Complete  Result Value Ref Range   Specific Gravity, UA 1.010 1.005 - 1.030   pH, UA 6.5 5.0 - 7.5   Color, UA Yellow Yellow   Appearance Ur Clear Clear   Leukocytes, UA Negative Negative   Protein, UA Negative Negative/Trace   Glucose, UA Negative Negative  Ketones, UA Negative Negative   RBC, UA Negative Negative   Bilirubin, UA Negative Negative   Urobilinogen, Ur 0.2 0.2 - 1.0 mg/dL   Nitrite, UA Negative Negative   Microscopic Examination See below:   BLADDER SCAN AMB NON-IMAGING  Result Value Ref Range   Scan Result [facility]    Pertinent imaging Results for Rachael Jordan, Rachael Jordan (MRN [id number]) as of 07/11/2015 13:32  Ref. Range 07/11/2015 09:39  Scan Result Unknown 0ML   Assessment & Plan:    1. History of gross hematuria- No further gross hematuria. Negative hematuria workup completed on 07/01/14. No microscopic hematuria noted on today's UA.  She will return in 1 year for repeat UA.  She will contact the office if she should develop gross hematuria.  - Urinalysis, Complete  2. Urinary Frequency- PVR 0 mL. patient's symptoms  well managed on Myrbetriq.  She will continue the medication. She will follow-up in one year time for symptom recheck and PVR.  3. Vaginal Atrophy- Previously treated with vaginal estrogen cream. Patient remains asymptomatic at this time.  Will continue to monitor. She'll return in 1 year for symptom recheck.  Return in about 1 year (around 07/10/2016) for UA and PVR and symptom recheck.  These notes generated with voice recognition software. I apologize for typographical errors.  Zara Council, [facility] [address], [place], [place] [address] [phone]

## 2015-07-12 ENCOUNTER — Encounter: Payer: Self-pay | Admitting: Pain Medicine

## 2015-07-12 ENCOUNTER — Ambulatory Visit: Payer: BLUE CROSS/BLUE SHIELD | Attending: Pain Medicine | Admitting: Pain Medicine

## 2015-07-12 VITALS — BP 122/67 | HR 72 | Temp 97.6°F | Resp 12 | Ht 65.0 in | Wt 166.0 lb

## 2015-07-12 DIAGNOSIS — M5136 Other intervertebral disc degeneration, lumbar region: Secondary | ICD-10-CM | POA: Insufficient documentation

## 2015-07-12 DIAGNOSIS — Z87448 Personal history of other diseases of urinary system: Secondary | ICD-10-CM | POA: Insufficient documentation

## 2015-07-12 DIAGNOSIS — M5030 Other cervical disc degeneration, unspecified cervical region: Secondary | ICD-10-CM

## 2015-07-12 DIAGNOSIS — M5416 Radiculopathy, lumbar region: Secondary | ICD-10-CM

## 2015-07-12 DIAGNOSIS — M545 Low back pain: Secondary | ICD-10-CM | POA: Diagnosis present

## 2015-07-12 DIAGNOSIS — M5481 Occipital neuralgia: Secondary | ICD-10-CM

## 2015-07-12 DIAGNOSIS — R350 Frequency of micturition: Secondary | ICD-10-CM | POA: Insufficient documentation

## 2015-07-12 DIAGNOSIS — M5126 Other intervertebral disc displacement, lumbar region: Secondary | ICD-10-CM | POA: Insufficient documentation

## 2015-07-12 DIAGNOSIS — G43109 Migraine with aura, not intractable, without status migrainosus: Secondary | ICD-10-CM

## 2015-07-12 DIAGNOSIS — G43101 Migraine with aura, not intractable, with status migrainosus: Secondary | ICD-10-CM

## 2015-07-12 DIAGNOSIS — M533 Sacrococcygeal disorders, not elsewhere classified: Secondary | ICD-10-CM

## 2015-07-12 DIAGNOSIS — N952 Postmenopausal atrophic vaginitis: Secondary | ICD-10-CM | POA: Insufficient documentation

## 2015-07-12 DIAGNOSIS — M48062 Spinal stenosis, lumbar region with neurogenic claudication: Secondary | ICD-10-CM

## 2015-07-12 DIAGNOSIS — M79606 Pain in leg, unspecified: Secondary | ICD-10-CM | POA: Diagnosis present

## 2015-07-12 MED ORDER — CEFAZOLIN SODIUM 1-5 GM-% IV SOLN: 1.0000 g | Freq: Once | INTRAVENOUS | Status: DC

## 2015-07-12 MED ORDER — FENTANYL CITRATE (PF) 100 MCG/2ML IJ SOLN
100.0000 ug | Freq: Once | INTRAMUSCULAR | Status: AC
  Administered 2015-07-12: 50 ug via INTRAVENOUS

## 2015-07-12 MED ORDER — CEFUROXIME AXETIL 250 MG PO TABS: 250.0000 mg | ORAL_TABLET | Freq: Two times a day (BID) | ORAL | Status: DC

## 2015-07-12 MED ORDER — BUPIVACAINE HCL (PF) 0.25 % IJ SOLN
INTRAMUSCULAR | Status: AC
  Administered 2015-07-12: 30 mL
  Filled 2015-07-12: qty 30

## 2015-07-12 MED ORDER — TRIAMCINOLONE ACETONIDE 40 MG/ML IJ SUSP
40.0000 mg | Freq: Once | INTRAMUSCULAR | Status: AC
  Administered 2015-07-12: 40 mg

## 2015-07-12 MED ORDER — ORPHENADRINE CITRATE 30 MG/ML IJ SOLN
INTRAMUSCULAR | Status: AC
  Administered 2015-07-12: 60 mg via INTRAMUSCULAR
  Filled 2015-07-12: qty 2

## 2015-07-12 MED ORDER — BUPIVACAINE HCL (PF) 0.25 % IJ SOLN
30.0000 mL | Freq: Once | INTRAMUSCULAR | Status: AC
  Administered 2015-07-12: 30 mL

## 2015-07-12 MED ORDER — MIDAZOLAM HCL 5 MG/5ML IJ SOLN
5.0000 mg | Freq: Once | INTRAMUSCULAR | Status: AC
  Administered 2015-07-12: 3 mg via INTRAVENOUS

## 2015-07-12 MED ORDER — CEFAZOLIN SODIUM 1 G IJ SOLR
INTRAMUSCULAR | Status: AC
  Administered 2015-07-12: 1 g
  Filled 2015-07-12: qty 10

## 2015-07-12 MED ORDER — MIDAZOLAM HCL 5 MG/5ML IJ SOLN
INTRAMUSCULAR | Status: AC
  Administered 2015-07-12: 3 mg via INTRAVENOUS
  Filled 2015-07-12: qty 5

## 2015-07-12 MED ORDER — LACTATED RINGERS IV SOLN: 1000.0000 mL | INTRAVENOUS | Status: DC

## 2015-07-12 MED ORDER — TRIAMCINOLONE ACETONIDE 40 MG/ML IJ SUSP
INTRAMUSCULAR | Status: AC
  Administered 2015-07-12: 40 mg
  Filled 2015-07-12: qty 1

## 2015-07-12 MED ORDER — ORPHENADRINE CITRATE 30 MG/ML IJ SOLN
60.0000 mg | Freq: Once | INTRAMUSCULAR | Status: AC
  Administered 2015-07-12: 60 mg via INTRAMUSCULAR

## 2015-07-12 MED ORDER — FENTANYL CITRATE (PF) 100 MCG/2ML IJ SOLN
INTRAMUSCULAR | Status: AC
  Administered 2015-07-12: 50 ug via INTRAVENOUS
  Filled 2015-07-12: qty 2

## 2015-07-12 NOTE — Progress Notes (Signed)
Patient here today for a procedure due to lower back pain that is causing pain in R leg and foot.  Safety precautions to be maintained throughout the outpatient stay will include: orient to surroundings, keep bed in low position, maintain call bell within reach at all times, provide assistance with transfer out of bed and ambulation.

## 2015-07-12 NOTE — Patient Instructions (Addendum)
PLAN  Continue present medication hydrocodone acetaminophen and begin taking antibiotic Ceftin as prescribed. Please obtain your antibiotic Ceftin today and begin taking antibiotic today  F/U PCP Dr. Iona Beard for evaliation of  BP and general medical  condition.   F/U surgical evaluation. Surgical evaluation as discussed  F/U neurological evaluation. May consider PNCV/EMG studies and other studies pending follow-up evaluations  May consider radiofrequency rhizolysis or intraspinal procedures pending response to present treatment and F/U evaluation.  Patient to call Pain Management Center should patient have concerns prior to scheduled return appointmentPain Management Discharge Instructions  General Discharge Instructions :  If you need to reach your doctor call: Monday-Friday 8:00 am - 4:00 pm at [phone] or toll free [phone].  After clinic hours [phone] to have operator reach doctor.  Bring all of your medication bottles to all your appointments in the pain clinic.  To cancel or reschedule your appointment with Pain Management please remember to call 24 hours in advance to avoid a fee.  Refer to the educational materials which you have been given on: General Risks, I had my Procedure. Discharge Instructions, Post Sedation.  Post Procedure Instructions:  The drugs you were given will stay in your system until tomorrow, so for the next 24 hours you should not drive, make any legal decisions or drink any alcoholic beverages.  You may eat anything you prefer, but it is better to start with liquids then soups and crackers, and gradually work up to solid foods.  Please notify your doctor immediately if you have any unusual bleeding, trouble breathing or pain that is not related to your normal pain.  Depending on the type of procedure that was done, some parts of your body may feel week and/or numb.  This usually clears up by tonight or the next day.  Walk with the use of  an assistive device or accompanied by an adult for the 24 hours.  You may use ice on the affected area for the first 24 hours.  Put ice in a Ziploc bag and cover with a towel and place against area 15 minutes on 15 minutes off.  You may switch to heat after 24 hours.

## 2015-07-12 NOTE — Progress Notes (Signed)
Subjective:    [name], female    DOB: [birth year], 58 y.o.   MRN: [id number]  HPI  PROCEDURE PERFORMED: Lumbosacral selective nerve root block   NOTE: The patient is a 60 y.o. female who returns to [facility] for further evaluation and treatment of pain involving the lumbar and lower extremity region. Studies consisting of MRI has revealed the patient to be with evidence of degenerative disc disease lumbar spine Shallow rightward disc protrusion with mild right subarticular narrowing. Mild facet hypertrophy L5-S1 without significant foraminal stenosis. There is concern regarding intraspinal abnormalities contributing to the patient's symptomatology with concern regarding component of pain due to lumbar radiculopathy . The risks, benefits, and expectations of the procedure have been explained to the patient who was understanding and in agreement with suggested treatment plan. We will proceed with interventional treatment as discussed and as explained to the patient. The patient is understanding and in agreement with suggested treatment plan.   DESCRIPTION OF PROCEDURE: Lumbosacral selective nerve root block with IV Versed, IV fentanyl conscious sedation, EKG, blood pressure, pulse, capnography, and pulse oximetry monitoring. The procedure was performed with the patient in the prone position under fluoroscopic guidance. With the patient in the prone position, Betadine prep of proposed entry site was performed. Local anesthetic skin wheal of proposed needle entry site was prepared with 1.5% plain lidocaine with AP view of the lumbosacral spine.   PROCEDURE #1: Needle placement at the right  L2  vertebral body: A 22 -gauge needle was inserted at the inferior border of the transverse process of the vertebral body with needle placed medial to the midline of the transverse process on AP view of the lumbosacral spine.   NEEDLE PLACEMENT AT  L3, L4, and L5  VERTEBRAL  BODY LEVELS  Needle  placement was accomplished at L3, L4, and L5  vertebral body levels on the right  side exactly as was accomplished at the L2  vertebral body level  and utilizing the same technique and under fluoroscopic guidance.  PROCEDURE #4: Needle placement at the S1 foramen. With the patient in the prone position with Betadine prep of proposed entry site accomplished, the S1 foramen was visualized under fluoroscopic guidance with AP view of the lumbosacral spine with cephalad orientation of the fluoroscope with local anesthetic skin wheal of 1.5% lidocaine of proposed needle entry site prepared. A 22-gauge needle was inserted S1 foramen under fluoroscopic guidance eliciting paresthesias radiating from the buttocks to the lower extremity after which needle was slightly withdrawn.   Needle placement was then verified on lateral view at all levels with needle tip documented to be in the posterior superior quadrant of the intervertebral foramen of  L 2, L3, L4, and L5. Following negative aspiration for heme and CSF at each level, each level was injected with 3 mL of 0.25% bupivacaine with Kenalog.  Myoneural block injections of the gluteal musculature region Following Betadine prep of proposed entry site a 22-gauge needle was inserted into the gluteal musculature region and following negative aspiration 2 cc of 0.25% bupivacaine with Norflex was injected for myoneural block injection of the gluteal musculature region 4   The patient tolerated the procedure well. A total of 10 mg of Kenalog was utilized for the procedure.   PLAN:  1. Medications: Will continue presently prescribed medication hydrocodone acetaminophen. 2. The patient is to undergo follow-up evaluation with PCP Dr. Iona Beard  for evaluation of blood pressure and general medical condition status post  procedure performed on today's visit. 3. Surgical follow-up evaluation.Has been addressed  4. Neurological evaluation.May consider  PNCV EMG studies and other studies  5. May consider radiofrequency procedures, implantation type procedures and other treatment pending response to treatment and follow-up evaluation. 6. The patient has been advise do adhere to proper body mechanics and avoid activities which may aggravate condition. 7. The patient has been advised to call the Pain Management Center prior to scheduled return appointment should there be significant change in the patient's condition or should the patient have other concerns regarding condition prior to scheduled return appointment.   Review of Systems     Objective:   Physical Exam        Assessment & Plan:

## 2015-07-13 ENCOUNTER — Telehealth: Payer: Self-pay | Admitting: *Deleted

## 2015-07-13 NOTE — Telephone Encounter (Signed)
Spoke with patient re; procedure on yesterday.  Denies any questions or concerns.

## 2015-07-14 ENCOUNTER — Encounter: Payer: Self-pay | Admitting: *Deleted

## 2015-07-18 NOTE — Telephone Encounter (Signed)
Medical clearance letter was faxed to Dr. Primus Bravo and Dr. Salome Holmes.

## 2015-07-19 ENCOUNTER — Telehealth: Payer: Self-pay | Admitting: Pain Medicine

## 2015-07-19 NOTE — Telephone Encounter (Signed)
Having problems in arm that IV was in pain and swelling, please call

## 2015-07-20 NOTE — Telephone Encounter (Signed)
Patient states arm turned black, and had tingling down the arm. Patient advised to see PCP. Also wants Dr. Primus Bravo to know that she continues to have pain in neck and lower back. Dr. Primus Bravo, please advise.

## 2015-07-20 NOTE — Telephone Encounter (Signed)
Nurses Please have patient see primary care physician for evaluation of IV site. We will further evaluate pain involving the neck and upper extremity at time of return appointment Please have patient go to the emergency room if she has significant pain of the neck and upper extremity and have the emergency room physician called me

## 2015-07-21 NOTE — Telephone Encounter (Signed)
attempted to return call.  Left message to return call to office.

## 2015-08-01 ENCOUNTER — Encounter: Payer: Self-pay | Admitting: Pain Medicine

## 2015-08-01 ENCOUNTER — Ambulatory Visit: Payer: BLUE CROSS/BLUE SHIELD | Attending: Pain Medicine | Admitting: Pain Medicine

## 2015-08-01 VITALS — BP 124/84 | HR 91 | Temp 98.0°F | Resp 16 | Ht 65.5 in | Wt 155.0 lb

## 2015-08-01 DIAGNOSIS — M5136 Other intervertebral disc degeneration, lumbar region: Secondary | ICD-10-CM

## 2015-08-01 DIAGNOSIS — M79606 Pain in leg, unspecified: Secondary | ICD-10-CM | POA: Diagnosis present

## 2015-08-01 DIAGNOSIS — M4806 Spinal stenosis, lumbar region: Secondary | ICD-10-CM | POA: Diagnosis not present

## 2015-08-01 DIAGNOSIS — M533 Sacrococcygeal disorders, not elsewhere classified: Secondary | ICD-10-CM | POA: Insufficient documentation

## 2015-08-01 DIAGNOSIS — M5126 Other intervertebral disc displacement, lumbar region: Secondary | ICD-10-CM | POA: Insufficient documentation

## 2015-08-01 DIAGNOSIS — M5481 Occipital neuralgia: Secondary | ICD-10-CM | POA: Diagnosis not present

## 2015-08-01 DIAGNOSIS — M5030 Other cervical disc degeneration, unspecified cervical region: Secondary | ICD-10-CM

## 2015-08-01 DIAGNOSIS — M5116 Intervertebral disc disorders with radiculopathy, lumbar region: Secondary | ICD-10-CM | POA: Insufficient documentation

## 2015-08-01 DIAGNOSIS — M545 Low back pain: Secondary | ICD-10-CM | POA: Diagnosis present

## 2015-08-01 DIAGNOSIS — G43101 Migraine with aura, not intractable, with status migrainosus: Secondary | ICD-10-CM

## 2015-08-01 DIAGNOSIS — G43109 Migraine with aura, not intractable, without status migrainosus: Secondary | ICD-10-CM

## 2015-08-01 DIAGNOSIS — M48062 Spinal stenosis, lumbar region with neurogenic claudication: Secondary | ICD-10-CM

## 2015-08-01 DIAGNOSIS — M5416 Radiculopathy, lumbar region: Secondary | ICD-10-CM

## 2015-08-01 MED ORDER — TOPIRAMATE 25 MG PO TABS: ORAL_TABLET | ORAL | Status: DC

## 2015-08-01 MED ORDER — HYDROCODONE-ACETAMINOPHEN 7.5-325 MG PO TABS: ORAL_TABLET | ORAL | Status: DC

## 2015-08-01 NOTE — Patient Instructions (Addendum)
PLAN   Continue present medication hydrocodone acetaminophen and Topamax NO NEURONTIN  . Topamax is similar to Neurontin and can cause drowsiness, confusion, and excessive sedation. Be very careful when taking Topamax to avoid these undesirable side effects  Lumbar facet, medial branch nerve, blocks to be performed at time return appointment  F/U PCP Dr. Iona Beard for evaliation of  BP and general medical  condition  F/U surgical evaluation. May consider further surgical evaluation  F/U neurological evaluation. May consider [facility] EMG studies and other studies  May consider radiofrequency rhizolysis or intraspinal procedures pending response to present treatment and F/U evaluation   Patient to call [facility] should patient have concerns prior to scheduled return appointment.GENERAL RISKS AND COMPLICATIONS  What are the risk, side effects and possible complications? Generally speaking, most procedures are safe.  However, with any procedure there are risks, side effects, and the possibility of complications.  The risks and complications are dependent upon the sites that are lesioned, or the type of nerve block to be performed.  The closer the procedure is to the spine, the more serious the risks are.  Great care is taken when placing the radio frequency needles, block needles or lesioning probes, but sometimes complications can occur. 1. Infection: Any time there is an injection through the skin, there is a risk of infection.  This is why sterile conditions are used for these blocks.  There are four possible types of infection. 1. Localized skin infection. 2. Central Nervous System Infection-This can be in the form of Meningitis, which can be deadly. 3. Epidural Infections-This can be in the form of an epidural abscess, which can cause pressure inside of the spine, causing compression of the spinal cord with subsequent paralysis. This would require an emergency surgery to decompress, and  there are no guarantees that the patient would recover from the paralysis. 4. Discitis-This is an infection of the intervertebral discs.  It occurs in about 1% of discography procedures.  It is difficult to treat and it may lead to surgery.        2. Pain: the needles have to go through skin and soft tissues, will cause soreness.       3. Damage to internal structures:  The nerves to be lesioned may be near blood vessels or    other nerves which can be potentially damaged.       4. Bleeding: Bleeding is more common if the patient is taking blood thinners such as  aspirin, Coumadin, Ticiid, Plavix, etc., or if he/she have some genetic predisposition  such as hemophilia. Bleeding into the spinal canal can cause compression of the spinal  cord with subsequent paralysis.  This would require an emergency surgery to  decompress and there are no guarantees that the patient would recover from the  paralysis.       5. Pneumothorax:  Puncturing of a lung is a possibility, every time a needle is introduced in  the area of the chest or upper back.  Pneumothorax refers to free air around the  collapsed lung(s), inside of the thoracic cavity (chest cavity).  Another two possible  complications related to a similar event would include: Hemothorax and Chylothorax.   These are variations of the Pneumothorax, where instead of air around the collapsed  lung(s), you may have blood or chyle, respectively.       6. Spinal headaches: They may occur with any procedures in the area of the spine.  7. Persistent CSF (Cerebro-Spinal Fluid) leakage: This is a rare problem, but may occur  with prolonged intrathecal or epidural catheters either due to the formation of a fistulous  track or a dural tear.       8. Nerve damage: By working so close to the spinal cord, there is always a possibility of  nerve damage, which could be as serious as a permanent spinal cord injury with  paralysis.       9. Death:  Although rare, severe  deadly allergic reactions known as "Anaphylactic  reaction" can occur to any of the medications used.      10. Worsening of the symptoms:  We can always make thing worse.  What are the chances of something like this happening? Chances of any of this occuring are extremely low.  By statistics, you have more of a chance of getting killed in a motor vehicle accident: while driving to the hospital than any of the above occurring .  Nevertheless, you should be aware that they are possibilities.  In general, it is similar to taking a shower.  Everybody knows that you can slip, hit your head and get killed.  Does that mean that you should not shower again?  Nevertheless always keep in mind that statistics do not mean anything if you happen to be on the wrong side of them.  Even if a procedure has a 1 (one) in a 1,000,000 (million) chance of going wrong, it you happen to be that one..Also, keep in mind that by statistics, you have more of a chance of having something go wrong when taking medications.  Who should not have this procedure? If you are on a blood thinning medication (e.g. Coumadin, Plavix, see list of "Blood Thinners"), or if you have an active infection going on, you should not have the procedure.  If you are taking any blood thinners, please inform your physician.  How should I prepare for this procedure?  Do not eat or drink anything at least six hours prior to the procedure.  Bring a driver with you .  It cannot be a taxi.  Come accompanied by an adult that can drive you back, and that is strong enough to help you if your legs get weak or numb from the local anesthetic.  Take all of your medicines the morning of the procedure with just enough water to swallow them.  If you have diabetes, make sure that you are scheduled to have your procedure done first thing in the morning, whenever possible.  If you have diabetes, take only half of your insulin dose and notify our nurse that you have  done so as soon as you arrive at the clinic.  If you are diabetic, but only take blood sugar pills (oral hypoglycemic), then do not take them on the morning of your procedure.  You may take them after you have had the procedure.  Do not take aspirin or any aspirin-containing medications, at least eleven (11) days prior to the procedure.  They may prolong bleeding.  Wear loose fitting clothing that may be easy to take off and that you would not mind if it got stained with Betadine or blood.  Do not wear any jewelry or perfume  Remove any nail coloring.  It will interfere with some of our monitoring equipment.  NOTE: Remember that this is not meant to be interpreted as a complete list of all possible complications.  Unforeseen problems may occur.  BLOOD THINNERS  The following drugs contain aspirin or other products, which can cause increased bleeding during surgery and should not be taken for 2 weeks prior to and 1 week after surgery.  If you should need take something for relief of minor pain, you may take acetaminophen which is found in Tylenol,m Datril, Anacin-3 and Panadol. It is not blood thinner. The products listed below are.  Do not take any of the products listed below in addition to any listed on your instruction sheet.  A.P.C or A.P.C with Codeine Codeine Phosphate Capsules #3 Ibuprofen Ridaura  ABC compound Congesprin Imuran rimadil  Advil Cope Indocin Robaxisal  Alka-Seltzer Effervescent Pain Reliever and Antacid Coricidin or Coricidin-D  Indomethacin Rufen  Alka-Seltzer plus Cold Medicine Cosprin Ketoprofen S-A-C Tablets  Anacin Analgesic Tablets or Capsules Coumadin Korlgesic Salflex  Anacin Extra Strength Analgesic tablets or capsules CP-2 Tablets Lanoril Salicylate  Anaprox Cuprimine Capsules Levenox Salocol  Anexsia-D Dalteparin Magan Salsalate  Anodynos Darvon compound Magnesium Salicylate Sine-off  Ansaid Dasin Capsules Magsal Sodium Salicylate  Anturane Depen Capsules  Marnal Soma  APF Arthritis pain formula Dewitt's Pills Measurin Stanback  Argesic Dia-Gesic Meclofenamic Sulfinpyrazone  Arthritis Bayer Timed Release Aspirin Diclofenac Meclomen Sulindac  Arthritis pain formula Anacin Dicumarol Medipren Supac  Analgesic (Safety coated) Arthralgen Diffunasal Mefanamic Suprofen  Arthritis Strength Bufferin Dihydrocodeine Mepro Compound Suprol  Arthropan liquid Dopirydamole Methcarbomol with Aspirin Synalgos  ASA tablets/Enseals Disalcid Micrainin Tagament  Ascriptin Doan's Midol Talwin  Ascriptin A/D Dolene Mobidin Tanderil  Ascriptin Extra Strength Dolobid Moblgesic Ticlid  Ascriptin with Codeine Doloprin or Doloprin with Codeine Momentum Tolectin  Asperbuf Duoprin Mono-gesic Trendar  Aspergum Duradyne Motrin or Motrin IB Triminicin  Aspirin plain, buffered or enteric coated Durasal Myochrisine Trigesic  Aspirin Suppositories Easprin Nalfon Trillsate  Aspirin with Codeine Ecotrin Regular or Extra Strength Naprosyn Uracel  Atromid-S Efficin Naproxen Ursinus  Auranofin Capsules Elmiron Neocylate Vanquish  Axotal Emagrin Norgesic Verin  Azathioprine Empirin or Empirin with Codeine Normiflo Vitamin E  Azolid Emprazil Nuprin Voltaren  Bayer Aspirin plain, buffered or children's or timed BC Tablets or powders Encaprin Orgaran Warfarin Sodium  Buff-a-Comp Enoxaparin Orudis Zorpin  Buff-a-Comp with Codeine Equegesic Os-Cal-Gesic   Buffaprin Excedrin plain, buffered or Extra Strength Oxalid   Bufferin Arthritis Strength Feldene Oxphenbutazone   Bufferin plain or Extra Strength Feldene Capsules Oxycodone with Aspirin   Bufferin with Codeine Fenoprofen Fenoprofen Pabalate or Pabalate-SF   Buffets II Flogesic Panagesic   Buffinol plain or Extra Strength Florinal or Florinal with Codeine Panwarfarin   Buf-Tabs Flurbiprofen Penicillamine   Butalbital Compound Four-way cold tablets Penicillin   Butazolidin Fragmin Pepto-Bismol   Carbenicillin Geminisyn Percodan    Carna Arthritis Reliever Geopen Persantine   Carprofen Gold's salt Persistin   Chloramphenicol Goody's Phenylbutazone   Chloromycetin Haltrain Piroxlcam   Clmetidine heparin Plaquenil   Cllnoril Hyco-pap Ponstel   Clofibrate Hydroxy chloroquine Propoxyphen         Before stopping any of these medications, be sure to consult the physician who ordered them.  Some, such as Coumadin (Warfarin) are ordered to prevent or treat serious conditions such as "deep thrombosis", "pumonary embolisms", and other heart problems.  The amount of time that you may need off of the medication may also vary with the medication and the reason for which you were taking it.  If you are taking any of these medications, please make sure you notify your pain physician before you undergo any procedures.         Facet Joint Block  The facet joints connect the bones of the spine (vertebrae). They make it possible for you to bend, twist, and make other movements with your spine. They also prevent you from overbending, overtwisting, and making other excessive movements.  A facet joint block is a procedure where a numbing medicine (anesthetic) is injected into a facet joint. Often, a type of anti-inflammatory medicine called a steroid is also injected. A facet joint block may be done for two reasons:  2. Diagnosis. A facet joint block may be done as a test to see whether neck or back pain is caused by a worn-down or infected facet joint. If the pain gets better after a facet joint block, it means the pain is probably coming from the facet joint. If the pain does not get better, it means the pain is probably not coming from the facet joint.  3. Therapy. A facet joint block may be done to relieve neck or back pain caused by a facet joint. A facet joint block is only done as a therapy if the pain does not improve with medicine, exercise programs, physical therapy, and other forms of pain management. LET [facility] CARE  PROVIDER KNOW ABOUT:   Any allergies you have.   All medicines you are taking, including vitamins, herbs, eyedrops, and over-the-counter medicines and creams.   Previous problems you or members of your family have had with the use of anesthetics.   Any blood disorders you have had.   Other health problems you have. RISKS AND COMPLICATIONS Generally, having a facet joint block is safe. However, as with any procedure, complications can occur. Possible complications associated with having a facet joint block include:   Bleeding.   Injury to a nerve near the injection site.   Pain at the injection site.   Weakness or numbness in areas controlled by nerves near the injection site.   Infection.   Temporary fluid retention.   Allergic reaction to anesthetics or medicines used during the procedure. BEFORE THE PROCEDURE   Follow your health care provider's instructions if you are taking dietary supplements or medicines. You may need to stop taking them or reduce your dosage.   Do not take any new dietary supplements or medicines without asking your health care provider first.   Follow your health care provider's instructions about eating and drinking before the procedure. You may need to stop eating and drinking several hours before the procedure.   Arrange to have an adult drive you home after the procedure. PROCEDURE 12. You may need to remove your clothing and dress in an open-back gown so that your health care provider can access your spine.  13. The procedure will be done while you are lying on an X-ray table. Most of the time you will be asked to lie on your stomach, but you may be asked to lie in a different position if an injection will be made in your neck.  14. Special machines will be used to monitor your oxygen levels, heart rate, and blood pressure.  15. If an injection will be made in your neck, an intravenous (IV) tube will be inserted into one of your  veins. Fluids and medicine will flow directly into your body through the IV tube.  16. The area over the facet joint where the injection will be made will be cleaned with an antiseptic soap. The surrounding skin will be covered with sterile drapes.  17. An anesthetic will be applied to your skin to make  the injection area numb. You may feel a temporary stinging or burning sensation.  18. A video X-ray machine will be used to locate the joint. A contrast dye may be injected into the facet joint area to help with locating the joint.  19. When the joint is located, an anesthetic medicine will be injected into the joint through the needle.  21. Your health care provider will ask you whether you feel pain relief. If you do feel relief, a steroid may be injected to provide pain relief for a longer period of time. If you do not feel relief or feel only partial relief, additional injections of an anesthetic may be made in other facet joints.  21. The needle will be removed, the skin will be cleansed, and bandages will be applied.  AFTER THE PROCEDURE   You will be observed for 15-30 minutes before being allowed to go home. Do not drive. Have an adult drive you or take a taxi or public transportation instead.   If you feel pain relief, the pain will return in several hours or days when the anesthetic wears off.   You may feel pain relief 2-14 days after the procedure. The amount of time this relief lasts varies from person to person.   It is normal to feel some tenderness over the injected area(s) for 2 days following the procedure.   If you have diabetes, you may have a temporary increase in blood sugar.   This information is not intended to replace advice given to you by your health care provider. Make sure you discuss any questions you have with your health care provider.   Document Released: 07/24/2006 Document Revised: 03/25/2014 Document Reviewed: 12/23/2011 Elsevier Interactive  Patient Education [employer].

## 2015-08-01 NOTE — Progress Notes (Signed)
Safety precautions to be maintained throughout the outpatient stay will include: orient to surroundings, keep bed in low position, maintain call bell within reach at all times, provide assistance with transfer out of bed and ambulation.  

## 2015-08-01 NOTE — Progress Notes (Signed)
Subjective:    Patient ID: Nicoletta Dress, female    DOB: [birth year], 58 y.o.   MRN: [id number]  HPI  The patient is a 60 year old female who returns to pain management for further evaluation and treatment of pain involving the lower back and lower extremity region. The patient also had complaint of pain involving the region of the right wrist and upper extremity in the region of the wrist and hand predominantly due to prior intravenous access. The patient stated that her pain involves the lower back and lower extremity region and that the pain increased with twisting and turning maneuvers especially. The patient is with prior surgical intervention of the lumbar region and appears to be with significant component of pain due to facet syndrome. We discussed patient's condition and will continue medication hydrocodone acetaminophen and will proceed with lumbar facet, medial branch nerve, blocks to be performed at time of return appointment in attempt to decrease severity of symptoms, minimize progression of symptoms, and avoid the need for more involved treatment. The patient was with understanding and agreement suggested treatment plan.  Review of Systems     Objective:   Physical Exam  There was tenderness to palpation of the paraspinal musculature the cervical region cervical facet region of mild degree with mild tenderness over the splenius capitis and occipitalis musculature regions. Palpation of the thoracic facet thoracic paraspinal musculature region was attends to palpation of the lower thoracic region a moderate degree with no crepitus of the thoracic region noted. Palpation of the acromial clavicular and glenohumeral joint regions was attends to palpation of mild degree and patient appeared to be with unremarkable Spurling's maneuver. Tinel and Phalen's maneuver were without increased pain of significant degree and patient appeared to be with bilaterally equal grip strength.  Palpation over the lumbar paraspinal must reason lumbar facet region was attends to palpation with lateral bending rotation extension and palpation of the lumbar facets reproducing pain of moderately severe degree. There was severe tenderness to palpation over the PSIS and PII S regions as well as the gluteal and piriformis musculature region with moderate tenderness of the greater trochanteric region and iliotibial band region. Straight leg raise was tolerates approximately 30 without an increase of pain with dorsiflexion noted. EHL strength appeared to be slightly decreased. No definite sensory deficit of dermatomal distribution was detected. DTRs appeared to be trace at the knees. There was negative clonus negative Homans. Abdomen nontender with no costovertebral tenderness noted.      Assessment & Plan:    Degenerative disc disease lumbar spine Shallow rightward disc protrusion with mild right subarticular narrowing. Mild facet hypertrophy L5-S1 without significant foraminal stenosis  Lumbar facet syndrome  Lumbar stenosis with neurogenic claudication  Lumbar radiculopathy  Sacroiliac joint dysfunction  Bilateral occipital neuralgia        PLAN   Continue present medication hydrocodone acetaminophen and Topamax NO NEURONTIN  . Topamax is similar to Neurontin and can cause drowsiness, confusion, and excessive sedation. Be very careful when taking Topamax to avoid these undesirable side effects  Lumbar facet, medial branch nerve, blocks to be performed at time return appointment  F/U PCP Dr. Iona Beard for evaliation of  BP and general medical  condition  F/U surgical evaluation. May consider further surgical evaluation  F/U neurological evaluation. May consider PN[facility] EMG studies and other studies  May consider radiofrequency rhizolysis or intraspinal procedures pending response to present treatment and F/U evaluation   Patient to call Pain Management Center should patient  have  concerns prior to scheduled return appointment.

## 2015-08-03 ENCOUNTER — Ambulatory Visit (INDEPENDENT_AMBULATORY_CARE_PROVIDER_SITE_OTHER): Payer: BLUE CROSS/BLUE SHIELD | Admitting: Podiatry

## 2015-08-03 ENCOUNTER — Encounter: Payer: Self-pay | Admitting: Podiatry

## 2015-08-03 DIAGNOSIS — M2040 Other hammer toe(s) (acquired), unspecified foot: Secondary | ICD-10-CM

## 2015-08-03 DIAGNOSIS — M205X9 Other deformities of toe(s) (acquired), unspecified foot: Secondary | ICD-10-CM

## 2015-08-03 DIAGNOSIS — M2010 Hallux valgus (acquired), unspecified foot: Secondary | ICD-10-CM

## 2015-08-03 NOTE — Patient Instructions (Signed)

## 2015-08-04 NOTE — Progress Notes (Signed)
[name], female   DOB: 22-Jun-1955, 60 y.o.   MRN: [id number]  Subjective: 60 year old female presents the office they for surgical consultation with her son for pain and left foot. The majority pain condition overlying the bunion as well as a crossover second toe. She describes as a sharp pain at times as well. This is the third toe is leaning as well. She is tried multiple conservative treatments for any relief of symptoms. She is tried shoe gear changes, offloading, padding without any relief of symptoms. At this time she is requesting surgical intervention. Denies any systemic complaints such as fevers, chills, nausea, vomiting. No acute changes since last appointment, and no other complaints at this time.   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 The third digit on the left side is also somewhat medially deviated. There is tenderness overlying these areas. No other areas of tenderness to the foot. No areas of pinpoint bony tenderness or pain with vibratory sensation. MMT 5/5, ROM WNL. No edema, erythema, increase in warmth to bilateral lower extremities.  No open lesions or pre-ulcerative lesions.  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  -Previous x-rays were again reviewed with the patient. I discussed both conservative and surgical treatment options as point she'll pursue surgical intervention.  I discussed with her left foot [place], Akin bunionectomy with toe repair of second and likely third digit as well as MPJ capsulotomy. -Patient encouraged  to call the office with any questions, concerns, change in symptoms.  The incision placement as well as the postoperative course was discussed with the patient. I discussed risks of the surgery which include, but not limited to, infection, bleeding, pain, swelling, need for further surgery, delayed or nonhealing, painful or ugly scar, numbness or sensation changes, over/under correction, recurrence, transfer lesions, further deformity, hardware failure, DVT/PE, loss of toe/foot. Patient understands these risks and wishes to proceed with surgery. The surgical consent was reviewed with the patient all 3 pages were signed. No promises or guarantees were given to the outcome of the procedure. All questions were answered to the best of my ability. Before the surgery the patient was encouraged to call the office if there is any further questions. The surgery will be performed at the [facility] on an outpatient basis.  Celesta Gentile, DPM

## 2015-08-07 ENCOUNTER — Ambulatory Visit: Payer: BLUE CROSS/BLUE SHIELD | Attending: Pain Medicine | Admitting: Pain Medicine

## 2015-08-07 ENCOUNTER — Encounter: Payer: Self-pay | Admitting: Pain Medicine

## 2015-08-07 VITALS — BP 123/79 | HR 78 | Temp 96.1°F | Resp 12 | Ht 65.0 in | Wt 161.0 lb

## 2015-08-07 DIAGNOSIS — M5126 Other intervertebral disc displacement, lumbar region: Secondary | ICD-10-CM | POA: Insufficient documentation

## 2015-08-07 DIAGNOSIS — M5030 Other cervical disc degeneration, unspecified cervical region: Secondary | ICD-10-CM

## 2015-08-07 DIAGNOSIS — M5416 Radiculopathy, lumbar region: Secondary | ICD-10-CM

## 2015-08-07 DIAGNOSIS — M5481 Occipital neuralgia: Secondary | ICD-10-CM

## 2015-08-07 DIAGNOSIS — M79606 Pain in leg, unspecified: Secondary | ICD-10-CM | POA: Diagnosis present

## 2015-08-07 DIAGNOSIS — M5136 Other intervertebral disc degeneration, lumbar region: Secondary | ICD-10-CM

## 2015-08-07 DIAGNOSIS — G43101 Migraine with aura, not intractable, with status migrainosus: Secondary | ICD-10-CM

## 2015-08-07 DIAGNOSIS — M48062 Spinal stenosis, lumbar region with neurogenic claudication: Secondary | ICD-10-CM

## 2015-08-07 DIAGNOSIS — M545 Low back pain: Secondary | ICD-10-CM | POA: Diagnosis present

## 2015-08-07 DIAGNOSIS — M533 Sacrococcygeal disorders, not elsewhere classified: Secondary | ICD-10-CM

## 2015-08-07 DIAGNOSIS — G43109 Migraine with aura, not intractable, without status migrainosus: Secondary | ICD-10-CM

## 2015-08-07 MED ORDER — MIDAZOLAM HCL 5 MG/5ML IJ SOLN
5.0000 mg | Freq: Once | INTRAMUSCULAR | Status: AC
  Administered 2015-08-07: 2 mg via INTRAVENOUS
  Filled 2015-08-07: qty 5

## 2015-08-07 MED ORDER — ORPHENADRINE CITRATE 30 MG/ML IJ SOLN
60.0000 mg | Freq: Once | INTRAMUSCULAR | Status: AC
  Administered 2015-08-07: 60 mg via INTRAMUSCULAR
  Filled 2015-08-07: qty 2

## 2015-08-07 MED ORDER — CEFAZOLIN SODIUM 1-5 GM-% IV SOLN
1.0000 g | Freq: Once | INTRAVENOUS | Status: AC
  Administered 2015-08-07: 1 g via INTRAVENOUS

## 2015-08-07 MED ORDER — LACTATED RINGERS IV SOLN: 1000.0000 mL | INTRAVENOUS | Status: DC

## 2015-08-07 MED ORDER — BUPIVACAINE HCL (PF) 0.25 % IJ SOLN
30.0000 mL | Freq: Once | INTRAMUSCULAR | Status: AC
  Administered 2015-08-07: 30 mL
  Filled 2015-08-07: qty 30

## 2015-08-07 MED ORDER — CEFUROXIME AXETIL 250 MG PO TABS: 250.0000 mg | ORAL_TABLET | Freq: Two times a day (BID) | ORAL | Status: DC

## 2015-08-07 MED ORDER — CEFAZOLIN SODIUM 1 G IJ SOLR
INTRAMUSCULAR | Status: AC
  Administered 2015-08-07: 11:00:00
  Filled 2015-08-07: qty 10

## 2015-08-07 MED ORDER — TRIAMCINOLONE ACETONIDE 40 MG/ML IJ SUSP
40.0000 mg | Freq: Once | INTRAMUSCULAR | Status: AC
  Administered 2015-08-07: 40 mg
  Filled 2015-08-07: qty 1

## 2015-08-07 MED ORDER — FENTANYL CITRATE (PF) 100 MCG/2ML IJ SOLN
100.0000 ug | Freq: Once | INTRAMUSCULAR | Status: AC
  Administered 2015-08-07: 50 ug via INTRAVENOUS
  Filled 2015-08-07: qty 2

## 2015-08-07 NOTE — Progress Notes (Signed)
Subjective:    [name], female    DOB: [birth year], 60 y.o.   MRN: [id number]  HPI  PROCEDURE PERFORMED: Lumbosacral selective nerve root block   NOTE: The patient is a 60 y.o. female who returns to [facility] for further evaluation and treatment of pain involving the lumbar and lower extremity region. Studies consisting of MRI has revealed the patient to be with evidence of Degenerative disc disease lumbar spine Shallow rightward disc protrusion with mild right subarticular narrowing. Mild facet hypertrophy L5-S1 without significant foraminal stenosis. There is concern regarding intraspinal abnormalities continue patient's symptomatology with concern regarding lumbar facet syndrome as well as lumbar radiculopathy. The patient is with complaint of significant pain involving the left lower extremity on today's visit which is more significant than pain of the lumbar region or the right lower extremity. This is his been made to proceed with lumbosacral selective nerve root block in attempt to decrease severity of patient's symptoms, minimize progression of symptoms, and avoid the need for more involved treatment. All agreed to suggested treatment plan. There is concern regarding intraspinal abnormalities contributing to the patient's symptomatology. The risks, benefits, and expectations of the procedure have been explained to the patient who was understanding and in agreement with suggested treatment plan. We will proceed with interventional treatment as discussed and as explained to the patient. The patient is understanding and in agreement with suggested treatment plan.   DESCRIPTION OF PROCEDURE: Lumbosacral selective nerve root block with IV Versed, IV fentanyl conscious sedation, EKG, blood pressure, pulse, capnography, and pulse oximetry monitoring. The procedure was performed with the patient in the prone position under fluoroscopic guidance. With the patient in  the prone position, Betadine prep of proposed entry site was performed. Local anesthetic skin wheal of proposed needle entry site was prepared with 1.5% plain lidocaine with AP view of the lumbosacral spine.   PROCEDURE #1: Needle placement at the left L 2 vertebral body: A 22 -gauge needle was inserted at the inferior border of the transverse process of the vertebral body with needle placed medial to the midline of the transverse process on AP view of the lumbosacral spine.   NEEDLE PLACEMENT AT  L3, L4, and L5  VERTEBRAL BODY LEVELS  Needle  placement was accomplished at L3, L4, and L5  vertebral body levels on the left side exactly as was accomplished at the L2  vertebral body level  and utilizing the same technique and under fluoroscopic guidance.  PROCEDURE #4: Needle placement at the S1 foramen. With the patient in the prone position with Betadine prep of proposed entry site accomplished, the S1 foramen was visualized under fluoroscopic guidance with AP view of the lumbosacral spine with cephalad orientation of the fluoroscope with local anesthetic skin wheal of 1.5% lidocaine of proposed needle entry site prepared. A 22-gauge needle was inserted S1 foramen under fluoroscopic guidance eliciting paresthesias radiating from the buttocks to the lower extremity after which needle was slightly withdrawn.   Needle placement was then verified on lateral view at all levels with needle tip documented to be in the posterior superior quadrant of the intervertebral foramen of  L 2, L3, L4, and L5. Following negative aspiration for heme and CSF at each level, each level was injected with 3 mL of 0.25% bupivacaine with Kenalog.  Myoneural block injections of the lumbar paraspinal musculature region Following Betadine prep of proposed entry site a 22-gauge needle was inserted into the lumbar paraspinal musculature region and  following negative aspiration 2 cc of 0.25% bupivacaine with Norflex was injected for  myoneural block injection of the lumbar paraspinal musculature region 4   The patient tolerated the procedure well  . A total of 10 mg of Kenalog was utilized for the procedure.   PLAN:  1. Medications: Will continue presently prescribed medications hydrocodone acetaminophen and Topamax. 2. The patient is to undergo follow-up evaluation with PCP Dr. Iona Beard for evaluation of blood pressure and general medical condition status post procedure performed on today's visit. 3. Surgical follow-up evaluation Has been addressed. 4. Neurological evaluation. May consider PNCV EMG studies and other studies May consider radiofrequency procedures, implantation type procedures and other treatment pending response to treatment and follow-up evaluation. 5. The patient has been advise do adhere to proper body mechanics and avoid activities which may aggravate condition. 6. The patient has been advised to call the [facility] Management Center prior to scheduled return appointment should there be significant change in the patient's condition or should the patient have other concerns regarding condition prior to scheduled return appointment.   Review of Systems     Objective:   Physical Exam        Assessment & Plan:

## 2015-08-07 NOTE — Progress Notes (Signed)
Patient her for procedure d/t neck pain.   Safety precautions to be maintained throughout the outpatient stay will include: orient to surroundings, keep bed in low position, maintain call bell within reach at all times, provide assistance with transfer out of bed and ambulation.

## 2015-08-07 NOTE — Patient Instructions (Addendum)
PLAN  Continue present medication hydrocodone acetaminophen and begin taking antibiotic Ceftin as prescribed. Please obtain your antibiotic Ceftin today and begin taking antibiotic today  F/U PCP Dr. Iona Beard for evaliation of  BP and general medical  condition.   F/U surgical evaluation. Surgical evaluation as discussed  F/U neurological evaluation. May consider PNCV/EMG studies and other studies pending follow-up evaluations  May consider radiofrequency rhizolysis or intraspinal procedures pending response to present treatment and F/U evaluation.  Patient to call Pain Management Center should patient have concerns prior to scheduled return appointment  Pain Management Discharge Instructions  General Discharge Instructions :  If you need to reach your doctor call: Monday-Friday 8:00 am - 4:00 pm at [phone] or toll free [phone].  After clinic hours [phone] to have operator reach doctor.  Bring all of your medication bottles to all your appointments in the pain clinic.  To cancel or reschedule your appointment with Pain Management please remember to call 24 hours in advance to avoid a fee.  Refer to the educational materials which you have been given on: General Risks, I had my Procedure. Discharge Instructions, Post Sedation.  Post Procedure Instructions:  The drugs you were given will stay in your system until tomorrow, so for the next 24 hours you should not drive, make any legal decisions or drink any alcoholic beverages.  You may eat anything you prefer, but it is better to start with liquids then soups and crackers, and gradually work up to solid foods.  Please notify your doctor immediately if you have any unusual bleeding, trouble breathing or pain that is not related to your normal pain.  Depending on the type of procedure that was done, some parts of your body may feel week and/or numb.  This usually clears up by tonight or the next day.  Walk with the use  of an assistive device or accompanied by an adult for the 24 hours.  You may use ice on the affected area for the first 24 hours.  Put ice in a Ziploc bag and cover with a towel and place against area 15 minutes on 15 minutes off.  You may switch to heat after 24 hours.  A prescription for CEFTIN was sent to your pharmacy and should be available for pickup today.

## 2015-08-08 ENCOUNTER — Telehealth: Payer: Self-pay | Admitting: *Deleted

## 2015-08-08 NOTE — Telephone Encounter (Signed)
Left message post procedure.

## 2015-08-08 NOTE — Telephone Encounter (Signed)
Dr. Iona Beard referred patient to Cardiology for medical clearance due to abnormal EKG.  Cannot give clearance.

## 2015-08-11 ENCOUNTER — Telehealth: Payer: Self-pay | Admitting: *Deleted

## 2015-08-11 NOTE — Telephone Encounter (Signed)
"  Is there any way possible I can move my surgery from August 2 to the following week?"  Yes, we can move it to August 9.  "That will be great because August 2 is my mama's birthday."  I'll get it rescheduled.  I called and rescheduled surgery with Renee at [facility]

## 2015-08-29 ENCOUNTER — Other Ambulatory Visit: Payer: Self-pay | Admitting: Family Medicine

## 2015-08-29 DIAGNOSIS — R0602 Shortness of breath: Secondary | ICD-10-CM | POA: Insufficient documentation

## 2015-08-29 DIAGNOSIS — Z1231 Encounter for screening mammogram for malignant neoplasm of breast: Secondary | ICD-10-CM

## 2015-08-30 ENCOUNTER — Encounter: Payer: BLUE CROSS/BLUE SHIELD | Admitting: Pain Medicine

## 2015-09-12 ENCOUNTER — Telehealth: Payer: Self-pay | Admitting: *Deleted

## 2015-09-12 NOTE — Telephone Encounter (Signed)
Pt called gave appt for 09/28/15 @ 8:15am...td

## 2015-09-14 ENCOUNTER — Other Ambulatory Visit: Payer: Self-pay | Admitting: Family Medicine

## 2015-09-14 ENCOUNTER — Ambulatory Visit
Admission: RE | Admit: 2015-09-14 | Discharge: 2015-09-14 | Disposition: A | Discharge status: Home / Self Care | Payer: BLUE CROSS/BLUE SHIELD | Source: Ambulatory Visit | Attending: Family Medicine | Admitting: Family Medicine

## 2015-09-14 DIAGNOSIS — Z1231 Encounter for screening mammogram for malignant neoplasm of breast: Secondary | ICD-10-CM | POA: Diagnosis present

## 2015-09-16 DIAGNOSIS — J40 Bronchitis, not specified as acute or chronic: Secondary | ICD-10-CM

## 2015-09-16 HISTORY — DX: Bronchitis, not specified as acute or chronic: J40

## 2015-09-20 ENCOUNTER — Telehealth: Payer: Self-pay | Admitting: *Deleted

## 2015-09-20 NOTE — Telephone Encounter (Signed)
Returned call-Pt states she is having a lot of pain in her back and down leg.Encourage pain medications, heat and if in severe pain she could go to the ER. Pt has appt on July 13,2017 for med refill

## 2015-09-20 NOTE — Telephone Encounter (Signed)
PT IS HAVING A LOT OF PROBLEMS WITH HER BACK AND RIGHT LEG. PLEASE GIVE THE PT A CALL.Marland KitchenMarland KitchenTHANKS

## 2015-09-21 ENCOUNTER — Telehealth: Payer: Self-pay | Admitting: *Deleted

## 2015-09-21 NOTE — Telephone Encounter (Signed)
Nurses Thank you for advising patient. I may consider neurosurgical reevaluation and updating MRI as well as obtaining PNCV/EMG studies and other studies at the time of patient's appointment.. Please inform patient of potential plan and remind patient, again, to go to the emergency department if her symptoms continue or worsen.  Thank you

## 2015-09-21 NOTE — Telephone Encounter (Signed)
Spoke with patient and told her Dr Primus Bravo response and plan.  Also, told patient that if she continues to worsen she should go to the ED for evaluation.  Patient has appt on July 13.  Patient verbalizes u/o information given.

## 2015-09-28 ENCOUNTER — Ambulatory Visit: Payer: BLUE CROSS/BLUE SHIELD | Attending: Pain Medicine | Admitting: Pain Medicine

## 2015-09-28 ENCOUNTER — Encounter: Payer: Self-pay | Admitting: Pain Medicine

## 2015-09-28 VITALS — BP 123/70 | HR 81 | Temp 98.3°F | Resp 16 | Ht 65.0 in | Wt 160.0 lb

## 2015-09-28 DIAGNOSIS — M5126 Other intervertebral disc displacement, lumbar region: Secondary | ICD-10-CM | POA: Diagnosis not present

## 2015-09-28 DIAGNOSIS — M5416 Radiculopathy, lumbar region: Secondary | ICD-10-CM

## 2015-09-28 DIAGNOSIS — M533 Sacrococcygeal disorders, not elsewhere classified: Secondary | ICD-10-CM | POA: Diagnosis not present

## 2015-09-28 DIAGNOSIS — M4806 Spinal stenosis, lumbar region: Secondary | ICD-10-CM | POA: Insufficient documentation

## 2015-09-28 DIAGNOSIS — M546 Pain in thoracic spine: Secondary | ICD-10-CM | POA: Diagnosis present

## 2015-09-28 DIAGNOSIS — M5116 Intervertebral disc disorders with radiculopathy, lumbar region: Secondary | ICD-10-CM | POA: Insufficient documentation

## 2015-09-28 DIAGNOSIS — M542 Cervicalgia: Secondary | ICD-10-CM | POA: Diagnosis present

## 2015-09-28 DIAGNOSIS — G43101 Migraine with aura, not intractable, with status migrainosus: Secondary | ICD-10-CM

## 2015-09-28 DIAGNOSIS — M5030 Other cervical disc degeneration, unspecified cervical region: Secondary | ICD-10-CM

## 2015-09-28 DIAGNOSIS — G43109 Migraine with aura, not intractable, without status migrainosus: Secondary | ICD-10-CM

## 2015-09-28 DIAGNOSIS — M5481 Occipital neuralgia: Secondary | ICD-10-CM | POA: Insufficient documentation

## 2015-09-28 DIAGNOSIS — M79606 Pain in leg, unspecified: Secondary | ICD-10-CM | POA: Diagnosis present

## 2015-09-28 DIAGNOSIS — M48062 Spinal stenosis, lumbar region with neurogenic claudication: Secondary | ICD-10-CM

## 2015-09-28 DIAGNOSIS — M5136 Other intervertebral disc degeneration, lumbar region: Secondary | ICD-10-CM

## 2015-09-28 MED ORDER — HYDROCODONE-ACETAMINOPHEN 7.5-325 MG PO TABS: ORAL_TABLET | ORAL | Status: DC

## 2015-09-28 NOTE — Progress Notes (Signed)
Patient here for medication management.  Patient states she will be having foot surgery on August 9. Safety precautions to be maintained throughout the outpatient stay will include: orient to surroundings, keep bed in low position, maintain call bell within reach at all times, provide assistance with transfer out of bed and ambulation.

## 2015-09-28 NOTE — Patient Instructions (Addendum)
PLAN  Continue present medication hydrocodone acetaminophen   Lumbosacral selective nerve root block to be performed at time of return appointment  F/U PCP Dr. Iona Beard for evaliation of  BP and general medical  condition.   F/U surgical evaluation. Surgical evaluation as discussed  F/U neurological evaluation. May consider PNCV/EMG studies and other studies pending follow-up evaluations  May consider radiofrequency rhizolysis or intraspinal procedures pending response to present treatment and F/U evaluation.  Patient to call [facility] should patient have concerns prior to scheduled return appointmentSelective Nerve Root Block Patient Information  Description: Specific nerve roots exit the spinal canal and these nerves can be compressed and inflamed by a bulging disc and bone spurs.  By injecting steroids on the nerve root, we can potentially decrease the inflammation surrounding these nerves, which often leads to decreased pain.  Also, by injecting local anesthesia on the nerve root, this can provide [place] helpful information to give to your referring doctor if it decreases your pain.  Selective nerve root blocks can be done along the spine from the neck to the low back depending on the location of your pain.   After numbing the skin with local anesthesia, a small needle is passed to the nerve root and the position of the needle is verified using x-ray pictures.  After the needle is in correct position, we then deposit the medication.  You may experience a pressure sensation while this is being done.  The entire block usually lasts less than 15 minutes.  Conditions that may be treated with selective nerve root blocks:  Low back and leg pain  Spinal stenosis  Diagnostic block prior to potential surgery  Neck and arm pain  Post laminectomy syndrome  Preparation for the injection:  1. Do not eat any solid food or dairy products within 8 hours of your appointment. 2. You may  drink clear liquids up to 3 hours before an appointment.  Clear liquids include water, black coffee, juice or soda.  No milk or cream please. 3. You may take your regular medications, including pain medications, with a sip of water before your appointment.  Diabetics should hold regular insulin (if taken separately) and take 1/2 normal NPH dose the morning of the procedure.  Carry some sugar containing items with you to your appointment. 4. A driver must accompany you and be prepared to drive you home after your procedure. 5. Bring all your current medications with you. 6. An IV may be inserted and sedation may be given at the discretion of the physician. 7. A blood pressure cuff, EKG, and other monitors will often be applied during the procedure.  Some patients may need to have extra oxygen administered for a short period. 8. You will be asked to provide medical information, including allergies, prior to the procedure.  We must know immediately if you are taking blood  Thinners (like Coumadin) or if you are allergic to IV iodine contrast (dye).  Possible side-effects: All are usually temporary  Bleeding from needle site  Light headedness  Numbness and tingling  Decreased blood pressure  Weakness in arms/legs  Pressure sensation in back/neck  Pain at injection site (several days)  Possible complications: All are extremely rare  Infection  Nerve injury  Spinal headache (a headache wore with upright position)  Call if you experience:  Fever/chills associated with headache or increased back/neck pain  Headache worsened by an upright position  New onset weakness or numbness of an extremity below the injection  site  Hives or difficulty breathing (go to the emergency room)  Inflammation or drainage at the injection site(s)  Severe back/neck pain greater than usual  New symptoms which are concerning to you  Please note:  Although the local anesthetic injected can often  make your back or neck feel good for several hours after the injection the pain will likely return.  It takes 3-5 days for steroids to work on the nerve root. You may not notice any pain relief for at least one week.  If effective, we will often do a series of 3 injections spaced 3-6 weeks apart to maximally decrease your pain.    If you have any questions, please call [phone]  [facility] R[facility] AND COMPLICATIONS  What are the risk, side effects and possible complications? Generally speaking, most procedures are safe.  However, with any procedure there are risks, side effects, and the possibility of complications.  The risks and complications are dependent upon the sites that are lesioned, or the type of nerve block to be performed.  The closer the procedure is to the spine, the more serious the risks are.  Great care is taken when placing the radio frequency needles, block needles or lesioning probes, but sometimes complications can occur. 1. Infection: Any time there is an injection through the skin, there is a risk of infection.  This is why sterile conditions are used for these blocks.  There are four possible types of infection. 1. Localized skin infection. 2. Central Nervous System Infection-This can be in the form of Meningitis, which can be deadly. 3. Epidural Infections-This can be in the form of an epidural abscess, which can cause pressure inside of the spine, causing compression of the spinal cord with subsequent paralysis. This would require an emergency surgery to decompress, and there are no guarantees that the patient would recover from the paralysis. 4. Discitis-This is an infection of the intervertebral discs.  It occurs in about 1% of discography procedures.  It is difficult to treat and it may lead to surgery.        2. Pain: the needles have to go through skin and soft tissues, will cause soreness.       3. Damage to internal  structures:  The nerves to be lesioned may be near blood vessels or    other nerves which can be potentially damaged.       4. Bleeding: Bleeding is more common if the patient is taking blood thinners such as  aspirin, Coumadin, Ticiid, Plavix, etc., or if he/she have some genetic predisposition  such as hemophilia. Bleeding into the spinal canal can cause compression of the spinal  cord with subsequent paralysis.  This would require an emergency surgery to  decompress and there are no guarantees that the patient would recover from the  paralysis.       5. Pneumothorax:  Puncturing of a lung is a possibility, every time a needle is introduced in  the area of the chest or upper back.  Pneumothorax refers to free air around the  collapsed lung(s), inside of the thoracic cavity (chest cavity).  Another two possible  complications related to a similar event would include: Hemothorax and Chylothorax.   These are variations of the Pneumothorax, where instead of air around the collapsed  lung(s), you may have blood or chyle, respectively.       6. Spinal headaches: They may occur with any procedures in the area of the spine.  7. Persistent CSF (Cerebro-Spinal Fluid) leakage: This is a rare problem, but may occur  with prolonged intrathecal or epidural catheters either due to the formation of a fistulous  track or a dural tear.       8. Nerve damage: By working so close to the spinal cord, there is always a possibility of  nerve damage, which could be as serious as a permanent spinal cord injury with  paralysis.       9. Death:  Although rare, severe deadly allergic reactions known as "Anaphylactic  reaction" can occur to any of the medications used.      10. Worsening of the symptoms:  We can always make thing worse.  What are the chances of something like this happening? Chances of any of this occuring are extremely low.  By statistics, you have more of a chance of getting killed in a motor vehicle  accident: while driving to the hospital than any of the above occurring .  Nevertheless, you should be aware that they are possibilities.  In general, it is similar to taking a shower.  Everybody knows that you can slip, hit your head and get killed.  Does that mean that you should not shower again?  Nevertheless always keep in mind that statistics do not mean anything if you happen to be on the wrong side of them.  Even if a procedure has a 1 (one) in a 1,000,000 (million) chance of going wrong, it you happen to be that one..Also, keep in mind that by statistics, you have more of a chance of having something go wrong when taking medications.  Who should not have this procedure? If you are on a blood thinning medication (e.g. Coumadin, Plavix, see list of "Blood Thinners"), or if you have an active infection going on, you should not have the procedure.  If you are taking any blood thinners, please inform your physician.  How should I prepare for this procedure?  Do not eat or drink anything at least six hours prior to the procedure.  Bring a driver with you .  It cannot be a taxi.  Come accompanied by an adult that can drive you back, and that is strong enough to help you if your legs get weak or numb from the local anesthetic.  Take all of your medicines the morning of the procedure with just enough water to swallow them.  If you have diabetes, make sure that you are scheduled to have your procedure done first thing in the morning, whenever possible.  If you have diabetes, take only half of your insulin dose and notify our nurse that you have done so as soon as you arrive at the clinic.  If you are diabetic, but only take blood sugar pills (oral hypoglycemic), then do not take them on the morning of your procedure.  You may take them after you have had the procedure.  Do not take aspirin or any aspirin-containing medications, at least eleven (11) days prior to the procedure.  They may prolong  bleeding.  Wear loose fitting clothing that may be easy to take off and that you would not mind if it got stained with Betadine or blood.  Do not wear any jewelry or perfume  Remove any nail coloring.  It will interfere with some of our monitoring equipment.  NOTE: Remember that this is not meant to be interpreted as a complete list of all possible complications.  Unforeseen problems may occur.  BLOOD THINNERS  The following drugs contain aspirin or other products, which can cause increased bleeding during surgery and should not be taken for 2 weeks prior to and 1 week after surgery.  If you should need take something for relief of minor pain, you may take acetaminophen which is found in Tylenol,m Datril, Anacin-3 and Panadol. It is not blood thinner. The products listed below are.  Do not take any of the products listed below in addition to any listed on your instruction sheet.  A.P.C or A.P.C with Codeine Codeine Phosphate Capsules #3 Ibuprofen Ridaura  ABC compound Congesprin Imuran rimadil  Advil Cope Indocin Robaxisal  Alka-Seltzer Effervescent Pain Reliever and Antacid Coricidin or Coricidin-D  Indomethacin Rufen  Alka-Seltzer plus Cold Medicine Cosprin Ketoprofen S-A-C Tablets  Anacin Analgesic Tablets or Capsules Coumadin Korlgesic Salflex  Anacin Extra Strength Analgesic tablets or capsules CP-2 Tablets Lanoril Salicylate  Anaprox Cuprimine Capsules Levenox Salocol  Anexsia-D Dalteparin Magan Salsalate  Anodynos Darvon compound Magnesium Salicylate Sine-off  Ansaid Dasin Capsules Magsal Sodium Salicylate  Anturane Depen Capsules Marnal Soma  APF Arthritis pain formula Dewitt's Pills Measurin Stanback  Argesic Dia-Gesic Meclofenamic Sulfinpyrazone  Arthritis Bayer Timed Release Aspirin Diclofenac Meclomen Sulindac  Arthritis pain formula Anacin Dicumarol Medipren Supac  Analgesic (Safety coated) Arthralgen Diffunasal Mefanamic Suprofen  Arthritis Strength Bufferin Dihydrocodeine  Mepro Compound Suprol  Arthropan liquid Dopirydamole Methcarbomol with Aspirin Synalgos  ASA tablets/Enseals Disalcid Micrainin Tagament  Ascriptin Doan's Midol Talwin  Ascriptin A/D Dolene Mobidin Tanderil  Ascriptin Extra Strength Dolobid Moblgesic Ticlid  Ascriptin with Codeine Doloprin or Doloprin with Codeine Momentum Tolectin  Asperbuf Duoprin Mono-gesic Trendar  Aspergum Duradyne Motrin or Motrin IB Triminicin  Aspirin plain, buffered or enteric coated Durasal Myochrisine Trigesic  Aspirin Suppositories Easprin Nalfon Trillsate  Aspirin with Codeine Ecotrin Regular or Extra Strength Naprosyn Uracel  Atromid-S Efficin Naproxen Ursinus  Auranofin Capsules Elmiron Neocylate Vanquish  Axotal Emagrin Norgesic Verin  Azathioprine Empirin or Empirin with Codeine Normiflo Vitamin E  Azolid Emprazil Nuprin Voltaren  Bayer Aspirin plain, buffered or children's or timed BC Tablets or powders Encaprin Orgaran Warfarin Sodium  Buff-a-Comp Enoxaparin Orudis Zorpin  Buff-a-Comp with Codeine Equegesic Os-Cal-Gesic   Buffaprin Excedrin plain, buffered or Extra Strength Oxalid   Bufferin Arthritis Strength Feldene Oxphenbutazone   Bufferin plain or Extra Strength Feldene Capsules Oxycodone with Aspirin   Bufferin with Codeine Fenoprofen Fenoprofen Pabalate or Pabalate-SF   Buffets II Flogesic Panagesic   Buffinol plain or Extra Strength Florinal or Florinal with Codeine Panwarfarin   Buf-Tabs Flurbiprofen Penicillamine   Butalbital Compound Four-way cold tablets Penicillin   Butazolidin Fragmin Pepto-Bismol   Carbenicillin Geminisyn Percodan   Carna Arthritis Reliever Geopen Persantine   Carprofen Gold's salt Persistin   Chloramphenicol Goody's Phenylbutazone   Chloromycetin Haltrain Piroxlcam   Clmetidine heparin Plaquenil   Cllnoril Hyco-pap Ponstel   Clofibrate Hydroxy chloroquine Propoxyphen         Before stopping any of these medications, be sure to consult the physician who ordered  them.  Some, such as Coumadin (Warfarin) are ordered to prevent or treat serious conditions such as "deep thrombosis", "pumonary embolisms", and other heart problems.  The amount of time that you may need off of the medication may also vary with the medication and the reason for which you were taking it.  If you are taking any of these medications, please make sure you notify your pain physician before you undergo any procedures.         GENERAL RISKS AND  COMPLICATIONS  What are the risk, side effects and possible complications? Generally speaking, most procedures are safe.  However, with any procedure there are risks, side effects, and the possibility of complications.  The risks and complications are dependent upon the sites that are lesioned, or the type of nerve block to be performed.  The closer the procedure is to the spine, the more serious the risks are.  Great care is taken when placing the radio frequency needles, block needles or lesioning probes, but sometimes complications can occur. 2. Infection: Any time there is an injection through the skin, there is a risk of infection.  This is why sterile conditions are used for these blocks.  There are four possible types of infection. 1. Localized skin infection. 2. Central Nervous System Infection-This can be in the form of Meningitis, which can be deadly. 3. Epidural Infections-This can be in the form of an epidural abscess, which can cause pressure inside of the spine, causing compression of the spinal cord with subsequent paralysis. This would require an emergency surgery to decompress, and there are no guarantees that the patient would recover from the paralysis. 4. Discitis-This is an infection of the intervertebral discs.  It occurs in about 1% of discography procedures.  It is difficult to treat and it may lead to surgery.        2. Pain: the needles have to go through skin and soft tissues, will cause soreness.       3. Damage to  internal structures:  The nerves to be lesioned may be near blood vessels or    other nerves which can be potentially damaged.       4. Bleeding: Bleeding is more common if the patient is taking blood thinners such as  aspirin, Coumadin, Ticiid, Plavix, etc., or if he/she have some genetic predisposition  such as hemophilia. Bleeding into the spinal canal can cause compression of the spinal  cord with subsequent paralysis.  This would require an emergency surgery to  decompress and there are no guarantees that the patient would recover from the  paralysis.       5. Pneumothorax:  Puncturing of a lung is a possibility, every time a needle is introduced in  the area of the chest or upper back.  Pneumothorax refers to free air around the  collapsed lung(s), inside of the thoracic cavity (chest cavity).  Another two possible  complications related to a similar event would include: Hemothorax and Chylothorax.   These are variations of the Pneumothorax, where instead of air around the collapsed  lung(s), you may have blood or chyle, respectively.       6. Spinal headaches: They may occur with any procedures in the area of the spine.       7. Persistent CSF (Cerebro-Spinal Fluid) leakage: This is a rare problem, but may occur  with prolonged intrathecal or epidural catheters either due to the formation of a fistulous  track or a dural tear.       8. Nerve damage: By working so close to the spinal cord, there is always a possibility of  nerve damage, which could be as serious as a permanent spinal cord injury with  paralysis.       9. Death:  Although rare, severe deadly allergic reactions known as "Anaphylactic  reaction" can occur to any of the medications used.      10. Worsening of the symptoms:  We can always make thing worse.  What are  the chances of something like this happening? Chances of any of this occuring are extremely low.  By statistics, you have more of a chance of getting killed in a motor  vehicle accident: while driving to the hospital than any of the above occurring .  Nevertheless, you should be aware that they are possibilities.  In general, it is similar to taking a shower.  Everybody knows that you can slip, hit your head and get killed.  Does that mean that you should not shower again?  Nevertheless always keep in mind that statistics do not mean anything if you happen to be on the wrong side of them.  Even if a procedure has a 1 (one) in a 1,000,000 (million) chance of going wrong, it you happen to be that one..Also, keep in mind that by statistics, you have more of a chance of having something go wrong when taking medications.  Who should not have this procedure? If you are on a blood thinning medication (e.g. Coumadin, Plavix, see list of "Blood Thinners"), or if you have an active infection going on, you should not have the procedure.  If you are taking any blood thinners, please inform your physician.  How should I prepare for this procedure?  Do not eat or drink anything at least six hours prior to the procedure.  Bring a driver with you .  It cannot be a taxi.  Come accompanied by an adult that can drive you back, and that is strong enough to help you if your legs get weak or numb from the local anesthetic.  Take all of your medicines the morning of the procedure with just enough water to swallow them.  If you have diabetes, make sure that you are scheduled to have your procedure done first thing in the morning, whenever possible.  If you have diabetes, take only half of your insulin dose and notify our nurse that you have done so as soon as you arrive at the clinic.  If you are diabetic, but only take blood sugar pills (oral hypoglycemic), then do not take them on the morning of your procedure.  You may take them after you have had the procedure.  Do not take aspirin or any aspirin-containing medications, at least eleven (11) days prior to the procedure.  They may  prolong bleeding.  Wear loose fitting clothing that may be easy to take off and that you would not mind if it got stained with Betadine or blood.  Do not wear any jewelry or perfume  Remove any nail coloring.  It will interfere with some of our monitoring equipment.  NOTE: Remember that this is not meant to be interpreted as a complete list of all possible complications.  Unforeseen problems may occur.  BLOOD THINNERS The following drugs contain aspirin or other products, which can cause increased bleeding during surgery and should not be taken for 2 weeks prior to and 1 week after surgery.  If you should need take something for relief of minor pain, you may take acetaminophen which is found in Tylenol,m Datril, Anacin-3 and Panadol. It is not blood thinner. The products listed below are.  Do not take any of the products listed below in addition to any listed on your instruction sheet.  A.P.C or A.P.C with Codeine Codeine Phosphate Capsules #3 Ibuprofen Ridaura  ABC compound Congesprin Imuran rimadil  Advil Cope Indocin Robaxisal  Alka-Seltzer Effervescent Pain Reliever and Antacid Coricidin or Coricidin-D  Indomethacin Rufen  Alka-Seltzer plus  Cold Medicine Cosprin Ketoprofen S-A-C Tablets  Anacin Analgesic Tablets or Capsules Coumadin Korlgesic Salflex  Anacin Extra Strength Analgesic tablets or capsules CP-2 Tablets Lanoril Salicylate  Anaprox Cuprimine Capsules Levenox Salocol  Anexsia-D Dalteparin Magan Salsalate  Anodynos Darvon compound Magnesium Salicylate Sine-off  Ansaid Dasin Capsules Magsal Sodium Salicylate  Anturane Depen Capsules Marnal Soma  APF Arthritis pain formula Dewitt's Pills Measurin Stanback  Argesic Dia-Gesic Meclofenamic Sulfinpyrazone  Arthritis Bayer Timed Release Aspirin Diclofenac Meclomen Sulindac  Arthritis pain formula Anacin Dicumarol Medipren Supac  Analgesic (Safety coated) Arthralgen Diffunasal Mefanamic Suprofen  Arthritis Strength Bufferin  Dihydrocodeine Mepro Compound Suprol  Arthropan liquid Dopirydamole Methcarbomol with Aspirin Synalgos  ASA tablets/Enseals Disalcid Micrainin Tagament  Ascriptin Doan's Midol Talwin  Ascriptin A/D Dolene Mobidin Tanderil  Ascriptin Extra Strength Dolobid Moblgesic Ticlid  Ascriptin with Codeine Doloprin or Doloprin with Codeine Momentum Tolectin  Asperbuf Duoprin Mono-gesic Trendar  Aspergum Duradyne Motrin or Motrin IB Triminicin  Aspirin plain, buffered or enteric coated Durasal Myochrisine Trigesic  Aspirin Suppositories Easprin Nalfon Trillsate  Aspirin with Codeine Ecotrin Regular or Extra Strength Naprosyn Uracel  Atromid-S Efficin Naproxen Ursinus  Auranofin Capsules Elmiron Neocylate Vanquish  Axotal Emagrin Norgesic Verin  Azathioprine Empirin or Empirin with Codeine Normiflo Vitamin E  Azolid Emprazil Nuprin Voltaren  Bayer Aspirin plain, buffered or children's or timed BC Tablets or powders Encaprin Orgaran Warfarin Sodium  Buff-a-Comp Enoxaparin Orudis Zorpin  Buff-a-Comp with Codeine Equegesic Os-Cal-Gesic   Buffaprin Excedrin plain, buffered or Extra Strength Oxalid   Bufferin Arthritis Strength Feldene Oxphenbutazone   Bufferin plain or Extra Strength Feldene Capsules Oxycodone with Aspirin   Bufferin with Codeine Fenoprofen Fenoprofen Pabalate or Pabalate-SF   Buffets II Flogesic Panagesic   Buffinol plain or Extra Strength Florinal or Florinal with Codeine Panwarfarin   Buf-Tabs Flurbiprofen Penicillamine   Butalbital Compound Four-way cold tablets Penicillin   Butazolidin Fragmin Pepto-Bismol   Carbenicillin Geminisyn Percodan   Carna Arthritis Reliever Geopen Persantine   Carprofen Gold's salt Persistin   Chloramphenicol Goody's Phenylbutazone   Chloromycetin Haltrain Piroxlcam   Clmetidine heparin Plaquenil   Cllnoril Hyco-pap Ponstel   Clofibrate Hydroxy chloroquine Propoxyphen         Before stopping any of these medications, be sure to consult the  physician who ordered them.  Some, such as Coumadin (Warfarin) are ordered to prevent or treat serious conditions such as "deep thrombosis", "pumonary embolisms", and other heart problems.  The amount of time that you may need off of the medication may also vary with the medication and the reason for which you were taking it.  If you are taking any of these medications, please make sure you notify your pain physician before you undergo any procedures.         Selective Nerve Root Block Patient Information  Description: Specific nerve roots exit the spinal canal and these nerves can be compressed and inflamed by a bulging disc and bone spurs.  By injecting steroids on the nerve root, we can potentially decrease the inflammation surrounding these nerves, which often leads to decreased pain.  Also, by injecting local anesthesia on the nerve root, this can provide [place] helpful information to give to your referring doctor if it decreases your pain.  Selective nerve root blocks can be done along the spine from the neck to the low back depending on the location of your pain.   After numbing the skin with local anesthesia, a small needle is passed to the nerve root and the position  of the needle is verified using x-ray pictures.  After the needle is in correct position, we then deposit the medication.  You may experience a pressure sensation while this is being done.  The entire block usually lasts less than 15 minutes.  Conditions that may be treated with selective nerve root blocks:  Low back and leg pain  Spinal stenosis  Diagnostic block prior to potential surgery  Neck and arm pain  Post laminectomy syndrome  Preparation for the injection:  9. Do not eat any solid food or dairy products within 8 hours of your appointment. 10. You may drink clear liquids up to 3 hours before an appointment.  Clear liquids include water, black coffee, juice or soda.  No milk or cream please. 11. You may take  your regular medications, including pain medications, with a sip of water before your appointment.  Diabetics should hold regular insulin (if taken separately) and take 1/2 normal NPH dose the morning of the procedure.  Carry some sugar containing items with you to your appointment. 12. A driver must accompany you and be prepared to drive you home after your procedure. 79. Bring all your current medications with you. 14. An IV may be inserted and sedation may be given at the discretion of the physician. 15. A blood pressure cuff, EKG, and other monitors will often be applied during the procedure.  Some patients may need to have extra oxygen administered for a short period. 28. You will be asked to provide medical information, including allergies, prior to the procedure.  We must know immediately if you are taking blood  Thinners (like Coumadin) or if you are allergic to IV iodine contrast (dye).  Possible side-effects: All are usually temporary  Bleeding from needle site  Light headedness  Numbness and tingling  Decreased blood pressure  Weakness in arms/legs  Pressure sensation in back/neck  Pain at injection site (several days)  Possible complications: All are extremely rare  Infection  Nerve injury  Spinal headache (a headache wore with upright position)  Call if you experience:  Fever/chills associated with headache or increased back/neck pain  Headache worsened by an upright position  New onset weakness or numbness of an extremity below the injection site  Hives or difficulty breathing (go to the emergency room)  Inflammation or drainage at the injection site(s)  Severe back/neck pain greater than usual  New symptoms which are concerning to you  Please note:  Although the local anesthetic injected can often make your back or neck feel good for several hours after the injection the pain will likely return.  It takes 3-5 days for steroids to work on the nerve  root. You may not notice any pain relief for at least one week.  If effective, we will often do a series of 3 injections spaced 3-6 weeks apart to maximally decrease your pain.    If you have any questions, please call [phone] [facility]

## 2015-09-28 NOTE — Progress Notes (Signed)
   Subjective:    [name], female    DOB: [birth year], 60 y.o.   MRN: [id number]  HPI  The patient returns to pain management for further evaluation and treatment of pain involving the neck entire back upper and lower extremity region. The patient states she is beginning to have some return of pain involving the lower back and lower extremity region. The patient stated that she enjoyed having no pain of the lower back and lower extremity following lumbosacral selective nerve root block. The patient stated that she was in hopes of being able to undergo the procedure again. The patient denies trauma change in events of daily living the cost change in symptomatology. We will continue hydrocodone acetaminophen. The patient is without plans for additional surgical intervention. We will proceed with lumbosacral selective nerve root block to be performed at time of return appointment. All agreed to suggested treatment plan  Review of Systems     Objective:   Physical Exam   Palpation of the splenius capitate and occipitalis region reproduced mild to moderate discomfort with mild to moderate tenderness over the cervical and thoracic facet thoracic paraspinal musculature region. There was mild to moderate tenderness of the acromioclavicular and glenohumeral joint region. The patient was with decreased grip strength. Tinel and Phalen's maneuver was associated with mild discomfort. There was unremarkable Spurling's maneuver. The patient relates to perform drop test with mild to moderate difficulty. Palpation of the lumbar region was with tenderness to palpation of moderate degree with lateral bending rotation extension and palpation over the lumbar facets reproducing moderate discomfort. Straight leg raising was tolerates approximately 20 without increase of pain with dorsiflexion noted. No definite sensory deficit of dermatomal distribution was detected. DTRs were difficult to elicit.  Palpation of the PSIS and PII S regions reproduce moderate discomfort. There was negative clonus negative Homans.     Assessment & Plan:     Degenerative disc disease lumbar spine Shallow rightward disc protrusion with mild right subarticular narrowing. Mild facet hypertrophy L5-S1 without significant foraminal stenosis  Lumbar facet syndrome  Lumbar stenosis with neurogenic claudication  Lumbar radiculopathy  Sacroiliac joint dysfunction  Bilateral occipital neuralgia       PLAN  Continue present medication hydrocodone acetaminophen   Lumbosacral selective nerve root block to be performed at time of return appointment  F/U PCP Dr. Iona Beard for evaliation of  BP and general medical  condition.   F/U surgical evaluation. Surgical evaluation as discussed  F/U neurological evaluation. May consider PNCV/EMG studies and other studies pending follow-up evaluations  May consider radiofrequency rhizolysis or intraspinal procedures pending response to present treatment and F/U evaluation.  Patient to call Pain Management Center should patient have concerns prior to scheduled return appointment

## 2015-10-09 ENCOUNTER — Encounter: Payer: Self-pay | Admitting: Pain Medicine

## 2015-10-09 ENCOUNTER — Ambulatory Visit: Payer: BLUE CROSS/BLUE SHIELD | Attending: Pain Medicine | Admitting: Pain Medicine

## 2015-10-09 DIAGNOSIS — M79606 Pain in leg, unspecified: Secondary | ICD-10-CM | POA: Diagnosis present

## 2015-10-09 DIAGNOSIS — G43109 Migraine with aura, not intractable, without status migrainosus: Secondary | ICD-10-CM

## 2015-10-09 DIAGNOSIS — M5136 Other intervertebral disc degeneration, lumbar region: Secondary | ICD-10-CM | POA: Diagnosis not present

## 2015-10-09 DIAGNOSIS — M533 Sacrococcygeal disorders, not elsewhere classified: Secondary | ICD-10-CM

## 2015-10-09 DIAGNOSIS — M48062 Spinal stenosis, lumbar region with neurogenic claudication: Secondary | ICD-10-CM

## 2015-10-09 DIAGNOSIS — M5481 Occipital neuralgia: Secondary | ICD-10-CM

## 2015-10-09 DIAGNOSIS — G43101 Migraine with aura, not intractable, with status migrainosus: Secondary | ICD-10-CM

## 2015-10-09 DIAGNOSIS — M5126 Other intervertebral disc displacement, lumbar region: Secondary | ICD-10-CM | POA: Diagnosis not present

## 2015-10-09 DIAGNOSIS — M545 Low back pain: Secondary | ICD-10-CM | POA: Diagnosis present

## 2015-10-09 DIAGNOSIS — M5030 Other cervical disc degeneration, unspecified cervical region: Secondary | ICD-10-CM

## 2015-10-09 DIAGNOSIS — M5416 Radiculopathy, lumbar region: Secondary | ICD-10-CM

## 2015-10-09 MED ORDER — LIDOCAINE HCL (PF) 1 % IJ SOLN
10.0000 mL | Freq: Once | INTRAMUSCULAR | Status: AC
  Administered 2015-10-09: 10 mL via SUBCUTANEOUS

## 2015-10-09 MED ORDER — MIDAZOLAM HCL 5 MG/5ML IJ SOLN
5.0000 mg | Freq: Once | INTRAMUSCULAR | Status: AC
  Administered 2015-10-09: 2 mg via INTRAVENOUS

## 2015-10-09 MED ORDER — CEFAZOLIN IN D5W 1 GM/50ML IV SOLN
1.0000 g | Freq: Once | INTRAVENOUS | Status: AC
  Administered 2015-10-09: 1 g via INTRAVENOUS

## 2015-10-09 MED ORDER — TRIAMCINOLONE ACETONIDE 40 MG/ML IJ SUSP
40.0000 mg | Freq: Once | INTRAMUSCULAR | Status: AC
  Administered 2015-10-09: 40 mg

## 2015-10-09 MED ORDER — LACTATED RINGERS IV SOLN
1000.0000 mL | INTRAVENOUS | Status: DC
  Administered 2015-10-09: 1000 mL via INTRAVENOUS

## 2015-10-09 MED ORDER — FENTANYL CITRATE (PF) 100 MCG/2ML IJ SOLN
100.0000 ug | Freq: Once | INTRAMUSCULAR | Status: AC
  Administered 2015-10-09: 100 ug via INTRAVENOUS

## 2015-10-09 MED ORDER — CEFUROXIME AXETIL 250 MG PO TABS: 250.0000 mg | ORAL_TABLET | Freq: Two times a day (BID) | ORAL | 0 refills | Status: DC

## 2015-10-09 MED ORDER — ORPHENADRINE CITRATE 30 MG/ML IJ SOLN
60.0000 mg | Freq: Once | INTRAMUSCULAR | Status: AC
  Administered 2015-10-09: 60 mg via INTRAMUSCULAR

## 2015-10-09 MED ORDER — BUPIVACAINE HCL (PF) 0.25 % IJ SOLN
30.0000 mL | Freq: Once | INTRAMUSCULAR | Status: AC
  Administered 2015-10-09: 30 mL

## 2015-10-09 NOTE — Patient Instructions (Addendum)
PLAN  Continue present medication hydrocodone acetaminophen and begin taking antibiotic Ceftin as prescribed. Please obtain your antibiotic Ceftin today and begin taking antibiotic today  F/U PCP Dr. Iona Beard for evaliation of  BP and general medical  condition.   F/U surgical evaluation. Surgical evaluation as discussed  F/U neurological evaluation. May consider PNCV/EMG studies and other studies pending follow-up evaluations  May consider radiofrequency rhizolysis or intraspinal procedures pending response to present treatment and F/U evaluation.  Patient to call Pain Management Center should patient have concerns prior to scheduled return appointmentPain Management Discharge Instructions  General Discharge Instructions :  If you need to reach your doctor call: Monday-Friday 8:00 am - 4:00 pm at [phone] or toll free [phone].  After clinic hours [phone] to have operator reach doctor.  Bring all of your medication bottles to all your appointments in the pain clinic.  To cancel or reschedule your appointment with Pain Management please remember to call 24 hours in advance to avoid a fee.  Refer to the educational materials which you have been given on: General Risks, I had my Procedure. Discharge Instructions, Post Sedation.  Post Procedure Instructions:  The drugs you were given will stay in your system until tomorrow, so for the next 24 hours you should not drive, make any legal decisions or drink any alcoholic beverages.  You may eat anything you prefer, but it is better to start with liquids then soups and crackers, and gradually work up to solid foods.  Please notify your doctor immediately if you have any unusual bleeding, trouble breathing or pain that is not related to your normal pain.  Depending on the type of procedure that was done, some parts of your body may feel week and/or numb.  This usually clears up by tonight or the next day.  Walk with the use of  an assistive device or accompanied by an adult for the 24 hours.  You may use ice on the affected area for the first 24 hours.  Put ice in a Ziploc bag and cover with a towel and place against area 15 minutes on 15 minutes off.  You may switch to heat after 24 hours.GENERAL RISKS AND COMPLICATIONS  What are the risk, side effects and possible complications? Generally speaking, most procedures are safe.  However, with any procedure there are risks, side effects, and the possibility of complications.  The risks and complications are dependent upon the sites that are lesioned, or the type of nerve block to be performed.  The closer the procedure is to the spine, the more serious the risks are.  Great care is taken when placing the radio frequency needles, block needles or lesioning probes, but sometimes complications can occur. 1. Infection: Any time there is an injection through the skin, there is a risk of infection.  This is why sterile conditions are used for these blocks.  There are four possible types of infection. 1. Localized skin infection. 2. Central Nervous System Infection-This can be in the form of Meningitis, which can be deadly. 3. Epidural Infections-This can be in the form of an epidural abscess, which can cause pressure inside of the spine, causing compression of the spinal cord with subsequent paralysis. This would require an emergency surgery to decompress, and there are no guarantees that the patient would recover from the paralysis. 4. Discitis-This is an infection of the intervertebral discs.  It occurs in about 1% of discography procedures.  It is difficult to treat and it may lead  to surgery.        2. Pain: the needles have to go through skin and soft tissues, will cause soreness.       3. Damage to internal structures:  The nerves to be lesioned may be near blood vessels or    other nerves which can be potentially damaged.       4. Bleeding: Bleeding is more common if the  patient is taking blood thinners such as  aspirin, Coumadin, Ticiid, Plavix, etc., or if he/she have some genetic predisposition  such as hemophilia. Bleeding into the spinal canal can cause compression of the spinal  cord with subsequent paralysis.  This would require an emergency surgery to  decompress and there are no guarantees that the patient would recover from the  paralysis.       5. Pneumothorax:  Puncturing of a lung is a possibility, every time a needle is introduced in  the area of the chest or upper back.  Pneumothorax refers to free air around the  collapsed lung(s), inside of the thoracic cavity (chest cavity).  Another two possible  complications related to a similar event would include: Hemothorax and Chylothorax.   These are variations of the Pneumothorax, where instead of air around the collapsed  lung(s), you may have blood or chyle, respectively.       6. Spinal headaches: They may occur with any procedures in the area of the spine.       7. Persistent CSF (Cerebro-Spinal Fluid) leakage: This is a rare problem, but may occur  with prolonged intrathecal or epidural catheters either due to the formation of a fistulous  track or a dural tear.       8. Nerve damage: By working so close to the spinal cord, there is always a possibility of  nerve damage, which could be as serious as a permanent spinal cord injury with  paralysis.       9. Death:  Although rare, severe deadly allergic reactions known as "Anaphylactic  reaction" can occur to any of the medications used.      10. Worsening of the symptoms:  We can always make thing worse.  What are the chances of something like this happening? Chances of any of this occuring are extremely low.  By statistics, you have more of a chance of getting killed in a motor vehicle accident: while driving to the hospital than any of the above occurring .  Nevertheless, you should be aware that they are possibilities.  In general, it is similar to taking a  shower.  Everybody knows that you can slip, hit your head and get killed.  Does that mean that you should not shower again?  Nevertheless always keep in mind that statistics do not mean anything if you happen to be on the wrong side of them.  Even if a procedure has a 1 (one) in a 1,000,000 (million) chance of going wrong, it you happen to be that one..Also, keep in mind that by statistics, you have more of a chance of having something go wrong when taking medications.  Who should not have this procedure? If you are on a blood thinning medication (e.g. Coumadin, Plavix, see list of "Blood Thinners"), or if you have an active infection going on, you should not have the procedure.  If you are taking any blood thinners, please inform your physician.  How should I prepare for this procedure?  Do not eat or drink anything at least six  hours prior to the procedure.  Bring a driver with you .  It cannot be a taxi.  Come accompanied by an adult that can drive you back, and that is strong enough to help you if your legs get weak or numb from the local anesthetic.  Take all of your medicines the morning of the procedure with just enough water to swallow them.  If you have diabetes, make sure that you are scheduled to have your procedure done first thing in the morning, whenever possible.  If you have diabetes, take only half of your insulin dose and notify our nurse that you have done so as soon as you arrive at the clinic.  If you are diabetic, but only take blood sugar pills (oral hypoglycemic), then do not take them on the morning of your procedure.  You may take them after you have had the procedure.  Do not take aspirin or any aspirin-containing medications, at least eleven (11) days prior to the procedure.  They may prolong bleeding.  Wear loose fitting clothing that may be easy to take off and that you would not mind if it got stained with Betadine or blood.  Do not wear any jewelry or  perfume  Remove any nail coloring.  It will interfere with some of our monitoring equipment.  NOTE: Remember that this is not meant to be interpreted as a complete list of all possible complications.  Unforeseen problems may occur.  BLOOD THINNERS The following drugs contain aspirin or other products, which can cause increased bleeding during surgery and should not be taken for 2 weeks prior to and 1 week after surgery.  If you should need take something for relief of minor pain, you may take acetaminophen which is found in Tylenol,m Datril, Anacin-3 and Panadol. It is not blood thinner. The products listed below are.  Do not take any of the products listed below in addition to any listed on your instruction sheet.  A.P.C or A.P.C with Codeine Codeine Phosphate Capsules #3 Ibuprofen Ridaura  ABC compound Congesprin Imuran rimadil  Advil Cope Indocin Robaxisal  Alka-Seltzer Effervescent Pain Reliever and Antacid Coricidin or Coricidin-D  Indomethacin Rufen  Alka-Seltzer plus Cold Medicine Cosprin Ketoprofen S-A-C Tablets  Anacin Analgesic Tablets or Capsules Coumadin Korlgesic Salflex  Anacin Extra Strength Analgesic tablets or capsules CP-2 Tablets Lanoril Salicylate  Anaprox Cuprimine Capsules Levenox Salocol  Anexsia-D Dalteparin Magan Salsalate  Anodynos Darvon compound Magnesium Salicylate Sine-off  Ansaid Dasin Capsules Magsal Sodium Salicylate  Anturane Depen Capsules Marnal Soma  APF Arthritis pain formula Dewitt's Pills Measurin Stanback  Argesic Dia-Gesic Meclofenamic Sulfinpyrazone  Arthritis Bayer Timed Release Aspirin Diclofenac Meclomen Sulindac  Arthritis pain formula Anacin Dicumarol Medipren Supac  Analgesic (Safety coated) Arthralgen Diffunasal Mefanamic Suprofen  Arthritis Strength Bufferin Dihydrocodeine Mepro Compound Suprol  Arthropan liquid Dopirydamole Methcarbomol with Aspirin Synalgos  ASA tablets/Enseals Disalcid Micrainin Tagament  Ascriptin Doan's Midol  Talwin  Ascriptin A/D Dolene Mobidin Tanderil  Ascriptin Extra Strength Dolobid Moblgesic Ticlid  Ascriptin with Codeine Doloprin or Doloprin with Codeine Momentum Tolectin  Asperbuf Duoprin Mono-gesic Trendar  Aspergum Duradyne Motrin or Motrin IB Triminicin  Aspirin plain, buffered or enteric coated Durasal Myochrisine Trigesic  Aspirin Suppositories Easprin Nalfon Trillsate  Aspirin with Codeine Ecotrin Regular or Extra Strength Naprosyn Uracel  Atromid-S Efficin Naproxen Ursinus  Auranofin Capsules Elmiron Neocylate Vanquish  Axotal Emagrin Norgesic Verin  Azathioprine Empirin or Empirin with Codeine Normiflo Vitamin E  Azolid Emprazil Nuprin Voltaren  Bayer Aspirin plain,  buffered or children's or timed BC Tablets or powders Encaprin Orgaran Warfarin Sodium  Buff-a-Comp Enoxaparin Orudis Zorpin  Buff-a-Comp with Codeine Equegesic Os-Cal-Gesic   Buffaprin Excedrin plain, buffered or Extra Strength Oxalid   Bufferin Arthritis Strength Feldene Oxphenbutazone   Bufferin plain or Extra Strength Feldene Capsules Oxycodone with Aspirin   Bufferin with Codeine Fenoprofen Fenoprofen Pabalate or Pabalate-SF   Buffets II Flogesic Panagesic   Buffinol plain or Extra Strength Florinal or Florinal with Codeine Panwarfarin   Buf-Tabs Flurbiprofen Penicillamine   Butalbital Compound Four-way cold tablets Penicillin   Butazolidin Fragmin Pepto-Bismol   Carbenicillin Geminisyn Percodan   Carna Arthritis Reliever Geopen Persantine   Carprofen Gold's salt Persistin   Chloramphenicol Goody's Phenylbutazone   Chloromycetin Haltrain Piroxlcam   Clmetidine heparin Plaquenil   Cllnoril Hyco-pap Ponstel   Clofibrate Hydroxy chloroquine Propoxyphen         Before stopping any of these medications, be sure to consult the physician who ordered them.  Some, such as Coumadin (Warfarin) are ordered to prevent or treat serious conditions such as "deep thrombosis", "pumonary embolisms", and other heart  problems.  The amount of time that you may need off of the medication may also vary with the medication and the reason for which you were taking it.  If you are taking any of these medications, please make sure you notify your pain physician before you undergo any procedures.

## 2015-10-09 NOTE — Progress Notes (Signed)
PROCEDURE PERFORMED: Lumbosacral selective nerve root block   NOTE: The patient is a 60 y.o. female who returns to [place] for further evaluation and treatment of pain involving the lumbar and lower extremity region. Studies consisting of MRI has revealed the patient to be with evidence of Degenerative disc disease lumbar spine Shallow rightward disc protrusion with mild right subarticular narrowing. Mild facet hypertrophy L5-S1 without significant foraminal stenosis. There is concern regarding intraspinal abnormalities contributing to the patient's symptomatology. There is concern regarding significant component of patient's pain being due to lumbar radiculopathy The risks, benefits, and expectations of the procedure have been explained to the patient who was understanding and in agreement with suggested treatment plan. We will proceed with interventional treatment as discussed and as explained to the patient. The patient is understanding and in agreement with suggested treatment plan.   DESCRIPTION OF PROCEDURE: Lumbosacral selective nerve root block with IV Versed, IV fentanyl conscious sedation, EKG, blood pressure, pulse, capnography, and pulse oximetry monitoring. The procedure was performed with the patient in the prone position under fluoroscopic guidance. With the patient in the prone position, Betadine prep of proposed entry site was performed. Local anesthetic skin wheal of proposed needle entry site was prepared with 1.5% plain lidocaine with AP view of the lumbosacral spine.   PROCEDURE #1: Needle placement at the right L 2 vertebral body: A 22 -gauge needle was inserted at the inferior border of the transverse process of the vertebral body with needle placed medial to the midline of the transverse process on AP view of the lumbosacral spine.   NEEDLE PLACEMENT AT  L3, L4, and L5  VERTEBRAL BODY LEVELS  Needle  placement was accomplished at L3, L4, and L5  vertebral  body levels on the right side exactly as was accomplished at the L2  vertebral body level  and utilizing the same technique and under fluoroscopic guidance.   Needle placement was then verified on lateral view at all levels with needle tip documented to be in the posterior superior quadrant of the intervertebral foramen of  L 2 L3, L4, and L5, and needle tip documented at the level of the S1 foramen. Following negative aspiration for heme and CSF at each level, each level was injected with 3 mL of 0.25% bupivacaine with Kenalog.   Myoneural block injections of the cervical region Following Betadine prep proposed entry site a 22-gauge needle was inserted into the cervical paraspinal musculature region and following negative aspiration 2 cc of 0.25% bupivacaine with Norflex was injected for myoneural block injection of the paraspinal cervical musculature region times two.   The patient tolerated the procedure well. A total of 10 mg of Kenalog was utilized for the procedure.   PLAN:  1. Medications: Will continue presently prescribed medication hydrocodone acetaminophen  We also discussed patient's use of Topamax 2. The patient is to undergo follow-up evaluation with PCP Dr. Iona Beard for evaluation of blood pressure and general medical condition status post procedure performed on today's visit. 3. Surgical follow-up evaluation. Has been addressed 4. Neurological evaluation. May consider PNCV EMG studies and other studies 5. May consider radiofrequency procedures, implantation type procedures and other treatment pending response to treatment and follow-up evaluation. 6. The patient has been advised do adhere to proper body mechanics and avoid activities which may aggravate condition. 7. The patient has been advised to call the Pain Management Center prior to scheduled return appointment should there be significant change in the patient's condition  or should the patient have other concerns regarding  condition prior to scheduled return appointment.

## 2015-10-10 ENCOUNTER — Telehealth: Payer: Self-pay | Admitting: *Deleted

## 2015-10-10 NOTE — Telephone Encounter (Signed)
Voicemail left with patient to call with any questions or concerns re; procedure on yesterday.

## 2015-10-11 ENCOUNTER — Telehealth: Payer: Self-pay

## 2015-10-11 NOTE — Telephone Encounter (Signed)
Nurses Please call patient and describe pain which patient is having in detail. There is concern regarding patient's pain being due to nerve entrapment as well as to intraspinal abnormalities. Recommend patient see her primary care physician to evaluate pain involving the inguinal region. Pending follow-up patient by primary care physician, the patient may be a candidate for nerve block of the inguinal region or of the intraspinal region. Please call patient and get further details of patient's symptoms and discussed with me and we will consider additional treatment as felt to be necessary. Thank you

## 2015-10-11 NOTE — Telephone Encounter (Signed)
Patient states that that the pain is in the lower back and goes around to right groin and right side of neck hurts as well.  Feels like a burning pain.  Instructed to go to PCP  As per Dr Primus Bravo.

## 2015-10-11 NOTE — Telephone Encounter (Signed)
Pt wants to know what can she do for this continuous pain in her groin.

## 2015-10-11 NOTE — Telephone Encounter (Signed)
Thank you very much 

## 2015-10-12 ENCOUNTER — Telehealth: Payer: Self-pay | Admitting: *Deleted

## 2015-10-12 NOTE — Telephone Encounter (Signed)
I'm calling to let you know we still haven't received medical clearance from Dr. Iona Beard.  You are scheduled for surgery on August 9.  "I know, I go to see her on tomorrow.  I will get her to send you a letter then."

## 2015-10-20 ENCOUNTER — Telehealth: Payer: Self-pay

## 2015-10-20 NOTE — Telephone Encounter (Signed)
Returned pt phone call, she stated that she needed to reschedule her surgery from 10/25/15 due to recent sinus infection and bronchitis. She stated that she was almost finished with her 2 week abt regimen and would like to get a surgical date before her kids returned to school. Agreed with new surgical date of 11/01/15. Informed [facility] and Dr Jacqualyn Posey of change.

## 2015-10-24 ENCOUNTER — Ambulatory Visit: Payer: BLUE CROSS/BLUE SHIELD | Attending: Pain Medicine | Admitting: Pain Medicine

## 2015-10-24 ENCOUNTER — Encounter: Payer: Self-pay | Admitting: Pain Medicine

## 2015-10-24 VITALS — BP 125/76 | HR 95 | Temp 98.5°F | Resp 14 | Ht 63.0 in | Wt 155.0 lb

## 2015-10-24 DIAGNOSIS — M533 Sacrococcygeal disorders, not elsewhere classified: Secondary | ICD-10-CM | POA: Insufficient documentation

## 2015-10-24 DIAGNOSIS — M5030 Other cervical disc degeneration, unspecified cervical region: Secondary | ICD-10-CM

## 2015-10-24 DIAGNOSIS — M4806 Spinal stenosis, lumbar region: Secondary | ICD-10-CM | POA: Diagnosis not present

## 2015-10-24 DIAGNOSIS — M48062 Spinal stenosis, lumbar region with neurogenic claudication: Secondary | ICD-10-CM

## 2015-10-24 DIAGNOSIS — M5416 Radiculopathy, lumbar region: Secondary | ICD-10-CM

## 2015-10-24 DIAGNOSIS — M5126 Other intervertebral disc displacement, lumbar region: Secondary | ICD-10-CM | POA: Diagnosis not present

## 2015-10-24 DIAGNOSIS — M5136 Other intervertebral disc degeneration, lumbar region: Secondary | ICD-10-CM

## 2015-10-24 DIAGNOSIS — M79606 Pain in leg, unspecified: Secondary | ICD-10-CM | POA: Diagnosis present

## 2015-10-24 DIAGNOSIS — M546 Pain in thoracic spine: Secondary | ICD-10-CM | POA: Diagnosis present

## 2015-10-24 DIAGNOSIS — M5116 Intervertebral disc disorders with radiculopathy, lumbar region: Secondary | ICD-10-CM | POA: Diagnosis not present

## 2015-10-24 DIAGNOSIS — M5481 Occipital neuralgia: Secondary | ICD-10-CM | POA: Diagnosis not present

## 2015-10-24 DIAGNOSIS — M542 Cervicalgia: Secondary | ICD-10-CM | POA: Diagnosis present

## 2015-10-24 DIAGNOSIS — G43009 Migraine without aura, not intractable, without status migrainosus: Secondary | ICD-10-CM

## 2015-10-24 MED ORDER — HYDROCODONE-ACETAMINOPHEN 7.5-325 MG PO TABS: ORAL_TABLET | ORAL | 0 refills | Status: DC

## 2015-10-24 MED ORDER — TOPIRAMATE 25 MG PO TABS: ORAL_TABLET | ORAL | 0 refills | Status: DC

## 2015-10-24 NOTE — Progress Notes (Signed)
    The patient is a 60 year old female who returns to pain management for further evaluation and treatment of pain involving the neck entire back upper and lower extremity region. At the present time patient states that she has headache which occurs and back of the neck radiates to the back of the head and continues to behind the eyes. We discussed patient's condition and we will proceed with greater occipital nerve block to be performed at time return appointment in attempt to decrease severity of patient's headaches. The patient was with understanding and agreed with suggested treatment plan. The patient denied any trauma change in events of daily living the call significant change in symptoms pathology. The patient stated that she was doing well in terms of lower back lower extremity pain and had no pain involving the inguinal region status post lumbosacral selective nerve root blocks.     Physical examination   There was tenderness to palpation of the splenius capitis and occipitalis region palpation which reproduces moderately severe discomfort. No new lesions of the head and neck were noted. There were no bounding pulsations of the temporal region noted. No excessive tends to palpation over the region of the sinuses noted. Palpation of the cervical facet region reproduced moderate discomfort as well. Palpation of the thoracic region was attends to palpation without crepitus of the thoracic region noted. Palpation over the lumbar region was of increased pain of mild-to-moderate degree with lateral bending rotation extension and palpation of the lumbar facets reproducing mild to moderate discomfort. Straight leg raise was tolerates approximately 30 without increased pain with dorsiflexion noted there was no sensory deficit or dermatomal distribution detected. The inguinal region was with minimal tenderness to palpation there was no sensory deficit or dermatomal distribution detected. There was  negative clonus negative Homans. Abdomen nontender with no costovertebral tenderness noted     Assessment   Degenerative disc disease lumbar spine Shallow rightward disc protrusion with mild right subarticular narrowing. Mild facet hypertrophy L5-S1 without significant foraminal stenosis  Lumbar facet syndrome  Lumbar stenosis with neurogenic claudication  Lumbar radiculopathy  Sacroiliac joint dysfunction  Bilateral occipital neuralgia     PLAN  Continue present medication Topamax and hydrocodone acetaminophen    Greater occipital nerve block to be performed at time of return appointment  F/U PCP Dr. Iona Beard for evaliation of  BP and general medical  condition.   F/U surgical evaluation. Surgical evaluation as discussed  F/U neurological evaluation. May consider PNCV/EMG studies and other studies pending follow-up evaluations  May consider radiofrequency rhizolysis or intraspinal procedures pending response to present treatment and F/U evaluation.  Patient to call [facility] should patient have concerns prior to scheduled return appointment

## 2015-10-24 NOTE — Patient Instructions (Addendum)
PLAN  Continue present medication Topamax and hydrocodone acetaminophen    Greater occipital nerve block to be performed at time of return appointment  F/U PCP Dr. Iona Beard for evaliation of  BP and general medical  condition.   F/U surgical evaluation. Surgical evaluation as discussed  F/U neurological evaluation. May consider PNCV/EMG studies and other studies pending follow-up evaluations  May consider radiofrequency rhizolysis or intraspinal procedures pending response to present treatment and F/U evaluation.  Patient to call Pain Management Center should patient have concerns prior to scheduled return appointment  Occipital Nerve Block Patient Information  Description: The occipital nerves originate in the cervical (neck) spinal cord and travel upward through muscle and tissue to supply sensation to the back of the head and top of the scalp.  In addition, the nerves control some of the muscles of the scalp.  Occipital neuralgia is an irritation of these nerves which can cause headaches, numbness of the scalp, and neck discomfort.     The occipital nerve block will interrupt nerve transmission through these nerves and can relieve pain and spasm.  The block consists of insertion of a small needle under the skin in the back of the head to deposit local anesthetic (numbing medicine) and/or steroids around the nerve.  The entire block usually lasts less than 5 minutes.  Conditions which may be treated by occipital blocks:   Muscular pain and spasm of the scalp  Nerve irritation, back of the head  Headaches  Upper neck pain  Preparation for the injection:  1. Do not eat any solid food or dairy products within 8 hours of your appointment. 2. You may drink clear liquids up to 3 hours before appointment.  Clear liquids include water, black coffee, juice or soda.  No milk or cream please. 3. You may take your regular medication, including pain medications, with a sip of water before you  appointment.  Diabetics should hold regular insulin (if taken separately) and take 1/2 normal NPH dose the morning of the procedure.  Carry some sugar containing items with you to your appointment. 4. A driver must accompany you and be prepared to drive you home after your procedure. 5. Bring all your current medications with you. 6. An IV may be inserted and sedation may be given at the discretion of the physician. 7. A blood pressure cuff, EKG, and other monitors will often be applied during the procedure.  Some patients may need to have extra oxygen administered for a short period. 8. You will be asked to provide medical information, including your allergies and medications, prior to the procedure.  We must know immediately if you are taking blood thinners (like Coumadin/Warfarin) or if you are allergic to IV iodine contrast (dye).  We must know if you could possible be pregnant.  9. Do not wear a high collared shirt or turtleneck.  Tie long hair up in the back if possible.  Possible side-effects:   Bleeding from needle site  Infection (rare, may require surgery)  Nerve injury (rare)  Hair on back of neck can be tinged with iodine scrub (this will wash out)  Light-headedness (temporary)  Pain at injection site (several days)  Decreased blood pressure (rare, temporary)  Seizure (very rare)  Call if you experience:   Hives or difficulty breathing ( go to the emergency room)  Inflammation or drainage at the injection site(s)  Please note:  Although the local anesthetic injected can often make your painful muscles or headache feel good  for several hours after the injection, the pain may return.  It takes 3-7 days for steroids to work.  You may not notice any pain relief for at least one week.  If effective, we will often do a series of injections spaced 3-6 weeks apart to maximally decrease your pain.  If you have any questions, please call [phone] [facility]

## 2015-10-26 ENCOUNTER — Encounter: Payer: Self-pay | Admitting: Podiatry

## 2015-10-26 ENCOUNTER — Telehealth: Payer: Self-pay | Admitting: *Deleted

## 2015-10-26 NOTE — Telephone Encounter (Signed)
Unable to contact pt, phone number in EPIC is invalid.

## 2015-10-27 ENCOUNTER — Telehealth: Payer: Self-pay | Admitting: *Deleted

## 2015-10-27 ENCOUNTER — Telehealth: Payer: Self-pay | Admitting: Pain Medicine

## 2015-10-27 NOTE — Telephone Encounter (Signed)
Patient states she has taken turn for the worse with bronchitis and illness and wants to wait on procedure. She will resched when she is feeling better.

## 2015-10-27 NOTE — Telephone Encounter (Signed)
Thank you :)

## 2015-10-27 NOTE — Telephone Encounter (Signed)
"  I need to reschedule my surgery again from 11/01/2015.  I thought I was getting better then I made a turn for the worst. 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  All of it together is excruciating.  I am experiencing pain and nausea.  I went back to the doctor today."  I'm hope you feel better soon.  When would you like to reschedule your surgery.  He can do it on August 30.  "That date will be fine."  I'll call and reschedule with the surgical center and I'll let Dr. Jacqualyn Posey know.  I called and left Caren Griffins at [facility] a message to reschedule surgery from 11/01/2015 to 11/15/2015.  Patient has Bronchitis, Sinus Infection and TMJ.

## 2015-10-31 DIAGNOSIS — E538 Deficiency of other specified B group vitamins: Secondary | ICD-10-CM | POA: Insufficient documentation

## 2015-11-01 ENCOUNTER — Ambulatory Visit: Payer: BLUE CROSS/BLUE SHIELD | Admitting: Pain Medicine

## 2015-11-01 LAB — TOXASSURE SELECT 13 (MW), URINE: PDF: 0

## 2015-11-01 NOTE — Progress Notes (Signed)
Reviewed

## 2015-11-02 ENCOUNTER — Encounter: Payer: Self-pay | Admitting: Podiatry

## 2015-11-03 ENCOUNTER — Telehealth: Payer: Self-pay

## 2015-11-03 NOTE — Telephone Encounter (Signed)
LVM to follow up on pt post op status. Advised any acute status changes should be report immediatley.

## 2015-11-09 ENCOUNTER — Encounter: Payer: Self-pay | Admitting: Podiatry

## 2015-11-14 ENCOUNTER — Telehealth: Payer: Self-pay | Admitting: *Deleted

## 2015-11-14 NOTE — Telephone Encounter (Addendum)
Pt states she was waiting to get instructions for what to do and what not to do before her surgery tomorrow. Murlean Caller - Surgery coordinator states she has spoken to pt. 12/27/2015-Pt complains of burning over the top of her foot and swelling. Pt states she has been icing without relief, and has some swelling.  I offered pt an appt in [place] and transferred to schedulers.

## 2015-11-14 NOTE — Telephone Encounter (Signed)
"  I'm scheduled for surgery on tomorrow.  Is there anything I'm supposed to do before surgery?"  You are not to eat or drink anything after midnight.  Do you still have your blue bag with brochure from the surgical center?  "Oh yes, I need to get that scrub brush out and clean my foot tonight.  Do I take my medicine in the morning?"  Call the surgical center and ask them.  Their number is [phone].

## 2015-11-15 ENCOUNTER — Encounter: Payer: Self-pay | Admitting: Podiatry

## 2015-11-15 DIAGNOSIS — M2012 Hallux valgus (acquired), left foot: Secondary | ICD-10-CM | POA: Diagnosis not present

## 2015-11-15 DIAGNOSIS — M2022 Hallux rigidus, left foot: Secondary | ICD-10-CM | POA: Diagnosis not present

## 2015-11-15 DIAGNOSIS — M2042 Other hammer toe(s) (acquired), left foot: Secondary | ICD-10-CM | POA: Diagnosis not present

## 2015-11-16 ENCOUNTER — Encounter: Payer: Self-pay | Admitting: Podiatry

## 2015-11-21 ENCOUNTER — Telehealth: Payer: Self-pay | Admitting: Podiatry

## 2015-11-21 NOTE — Telephone Encounter (Signed)
Patient called the [facility] office on 11/16/15 returning a call. This was her post-op call. I spoke to the patient. She states that she was having pain to her foot and it started earlier that morning. She has been taking 1 percocet every 4 hours. I increased her dose to 2 every 6 hours. Continue to ice and elevate and not dangle her foot or be up on her feet. She verbalized understanding. Denies any systemic complaints such as fevers, chills, nausea, vomiting. No calf pain, chest pain, SOB. No other complaints at this time. Encouraged to call back with any further questions or concerns.

## 2015-11-23 ENCOUNTER — Ambulatory Visit: Payer: BLUE CROSS/BLUE SHIELD | Admitting: Pain Medicine

## 2015-11-24 ENCOUNTER — Encounter: Payer: Self-pay | Admitting: Podiatry

## 2015-11-24 ENCOUNTER — Ambulatory Visit (INDEPENDENT_AMBULATORY_CARE_PROVIDER_SITE_OTHER): Payer: BLUE CROSS/BLUE SHIELD | Admitting: Podiatry

## 2015-11-24 ENCOUNTER — Ambulatory Visit (INDEPENDENT_AMBULATORY_CARE_PROVIDER_SITE_OTHER): Payer: BLUE CROSS/BLUE SHIELD

## 2015-11-24 VITALS — BP 105/72 | HR 108 | Ht 63.0 in | Wt 158.0 lb

## 2015-11-24 DIAGNOSIS — Z9889 Other specified postprocedural states: Secondary | ICD-10-CM

## 2015-11-24 DIAGNOSIS — M2012 Hallux valgus (acquired), left foot: Secondary | ICD-10-CM

## 2015-11-24 DIAGNOSIS — M2042 Other hammer toe(s) (acquired), left foot: Secondary | ICD-10-CM

## 2015-11-24 NOTE — Patient Instructions (Signed)
Pain Relief Preoperatively and Postoperatively  If you have questions, problems, or concerns about the pain that you may feel after surgery, let your health care provider know. Patients have the right to assessment and management of pain. Severe pain after surgery--and the fear or anxiety associated with that pain--may cause extreme discomfort that:  · Prevents sleep.  · Decreases the ability to breathe deeply and to cough. This can result in pneumonia or other upper airway infections.  · Causes the heart to beat more quickly and the blood pressure to be higher.  · Increases the risk for constipation and bloating.  · Decreases the ability of wounds to heal.  · May result in depression, increased anxiety, and feelings of helplessness.  Relieving pain before surgery (preoperatively) is also important because it lessens pain that you have after surgery (postoperatively). Patients who receive pain relief both before and after surgery experience greater pain relief than those who receive pain relief only after surgery. Let your health care provider know if you are having uncontrolled pain. This is very important. Pain after surgery is more difficult to manage if it is severe, so receiving prompt and adequate treatment of acute pain is necessary. If you become constipated after taking pain medicine, drink more liquids if you can. Your health care provider may have you take a mild laxative.  PAIN CONTROL METHODS  Your health care providers follow policies and procedures about the management of your pain. These guidelines should be explained to you before surgery. Plans for pain control after surgery must be decided upon by you and your health care provider and put into use with your full understanding and agreement. Do not be afraid to ask questions about the care that you are receiving.  Your health care providers will attempt to control your pain in various ways, and these methods may be used together (multimodal  analgesia). Using this approach has many benefits for you, including being able to eat, move around, and leave the hospital sooner.  As-Needed Pain Control  · You may be given pain medicine through an IV tube or as a pill or liquid that you can swallow. Let your health care provider know when you are having pain, and he or she will give you the pain medicine that is ordered for you.  IV Patient-Controlled Analgesia (PCA) Pump  · You can receive your pain medicine through an IV tube that goes into one of your veins. You can control the amount of pain medicine that you get. The pain medicine is controlled by a pump. When you push the button that is hooked up to this pump, you receive a specific amount of pain medicine. This button should be pushed only by you or by someone who is specifically assigned by you to do so. It is set up to keep you from accidentally giving yourself too much pain medicine. You will be able to start using your pain pump in the recovery room after your surgery. This method can be helpful for most types of surgery.  · Tell your health care provider:    If you are having too much pain.    If you are feeling too sleepy or nauseous.  Continuous Epidural Pain Control  · A thin, soft tube (catheter) is put into your back, outside the outer layer of your spinal cord. Pain medicine flows through the catheter to lessen pain in areas of your body that are below the level of catheter placement. Continuous epidural pain control may work best for you   if you are having surgery on your abdomen, hip area, or legs. The epidural catheter is usually put into your back shortly before surgery. It is left in until you can eat, take medicine by mouth, pass urine, and have a bowel movement.  · Giving pain medicine through the epidural catheter may help you to heal more quickly because you can do these things sooner:    Regain normal bowel and bladder function.    Return to eating.    Get up and walk.  Medicine That  Numbs the Area (Local Anesthetic)  You may be given pain medicine:  · As an injection near the area of the pain (local infiltration).  · As an injection near the nerve that controls the sensation to a specific part of your body (peripheral nerve block).  · In your spine to block pain (spinal block).  · Through a local anesthetic reservoir pump. If your surgeon or anesthesiologist selects this option as a part of your pain control, one or more thin, soft tubes will be inserted into your incision site(s) at the end of surgery. These tubes will be connected to a device that is filled with a non-narcotic pain medicine. This medicine gradually empties into your incision site over the next several days. Usually, after all of the medicine is used, your health care provider will remove the tubes and throw away the device.  Opioids  · Moderate to moderately severe acute pain after surgery may respond to opioids. Opioids are narcotic pain medicine. Opioids are often combined with non-narcotic medicines to improve pain relief, lower the risk of side effects, and reduce the chance of addiction.  · If you follow your health care provider's directions about taking opioids and you do not have a history of substance abuse, your risk of becoming addicted is very small. To prevent addiction, opioids are given for short periods of time in careful doses.  Other Methods of Pain Control  · Steroids.  · Physical therapy.  · Heat and cold therapy.  · Compression, such as wrapping an elastic bandage around the area of the pain.  · Massage.     This information is not intended to replace advice given to you by your health care provider. Make sure you discuss any questions you have with your health care provider.     Document Released: 05/25/2002 Document Revised: 03/25/2014 Document Reviewed: 05/29/2010  Elsevier Interactive Patient Education ©2016 [employer]

## 2015-11-24 NOTE — Progress Notes (Signed)
Subjective: Patient comes in today 1 week postop for bunionectomy and hammertoe repair of the left foot. Patient presents wearing her CAM boot and dressings which are clean dry and intact. Patient does state she's had quite a bit of significant pain over the past week  Objective: Incision sites are well coapted no dehiscence noted. No erythema with only a moderate amount of edema.  Radiographic exam: Orthopedic hardware intact with no changes evident on radiologic exam. X-rays demonstrate stable postop.  Assessment:  #1 one week postop bunionectomy and hammertoe repair left foot.  Plan of care: Patient is doing well. She's returned back in 1 week with either myself or Dr. Earleen Newport for 2 week evaluation and possible suture removal.

## 2015-11-24 NOTE — Progress Notes (Signed)
DOS 08.30.2017 Left foot surgical correction of bunion (Austin/Akin bunionectomy) hammertoe repair 2nd toe possible 3rd toe

## 2015-11-30 ENCOUNTER — Ambulatory Visit (INDEPENDENT_AMBULATORY_CARE_PROVIDER_SITE_OTHER): Payer: BLUE CROSS/BLUE SHIELD | Admitting: Podiatry

## 2015-11-30 ENCOUNTER — Encounter: Payer: Self-pay | Admitting: Podiatry

## 2015-11-30 DIAGNOSIS — Z9889 Other specified postprocedural states: Secondary | ICD-10-CM

## 2015-11-30 DIAGNOSIS — M2012 Hallux valgus (acquired), left foot: Secondary | ICD-10-CM | POA: Diagnosis not present

## 2015-11-30 DIAGNOSIS — M2042 Other hammer toe(s) (acquired), left foot: Secondary | ICD-10-CM

## 2015-11-30 MED ORDER — OXYCODONE-ACETAMINOPHEN 5-325 MG PO TABS: 1.0000 | ORAL_TABLET | ORAL | 0 refills | Status: DC | PRN

## 2015-11-30 NOTE — Progress Notes (Signed)
Subjective: Rachael Jordan is a 60 y.o. female is seen today in office s/p left Austin/akin, 2nd and 3rd hammertoe repair preformed on 11/15/15. She is getting some sharp, shooting pain to her toes as she is on her feet or puts her foot down and it swells. Denies any systemic complaints such as fevers, chills, nausea, vomiting. No calf pain, chest pain, shortness of breath.   Objective: General: No acute distress, AAOx3  DP/PT pulses palpable 2/4, CRT < 3 sec to all digits.  Protective sensation intact. Motor function intact.  Left foot: Incision is well coapted without any evidence of dehiscence and sutures intact. There is no surrounding erythema, ascending cellulitis, fluctuance, crepitus, malodor, drainage/purulence. There is mild edema around the surgical site. There is mild pain along the surgical site. Toes sit in a rectus position.  No other areas of tenderness to bilateral lower extremities.  No other open lesions or pre-ulcerative lesions.  No pain with calf compression, swelling, warmth, erythema.   Assessment and Plan:  Status post left foot surgery, doing well with no complications   -Treatment options discussed including all alternatives, risks, and complications -I left sutures intact. Antibiotic ointment was applied followed by dressing. Keep dressing clean, dry, intact. If the bandage becomes tight shoes the outer bandage. -Ice/elevation. She'll wear surgical shoe at night but she is not to wear this during the day. She is to with the cam boot at all times otherwise. -Pain medication as needed. Refilled Percocet. She's been taking Vicodin that she had previously for her back. She is not to take any other pain is well taking Percocet. She verbally understood this. -Monitor for any clinical signs or symptoms of infection and DVT/PE and directed to call the office immediately should any occur or go to the ER. -Follow-up in 1 week or sooner if any problems arise. In the  meantime, encouraged to call the office with any questions, concerns, change in symptoms.   Celesta Gentile, DPM

## 2015-12-07 ENCOUNTER — Ambulatory Visit (INDEPENDENT_AMBULATORY_CARE_PROVIDER_SITE_OTHER): Payer: BLUE CROSS/BLUE SHIELD | Admitting: Podiatry

## 2015-12-07 ENCOUNTER — Encounter: Payer: Self-pay | Admitting: Podiatry

## 2015-12-07 DIAGNOSIS — M2042 Other hammer toe(s) (acquired), left foot: Secondary | ICD-10-CM

## 2015-12-07 DIAGNOSIS — M2012 Hallux valgus (acquired), left foot: Secondary | ICD-10-CM

## 2015-12-07 DIAGNOSIS — Z9889 Other specified postprocedural states: Secondary | ICD-10-CM

## 2015-12-07 MED ORDER — OXYCODONE-ACETAMINOPHEN 5-325 MG PO TABS: 1.0000 | ORAL_TABLET | Freq: Four times a day (QID) | ORAL | 0 refills | Status: DC | PRN

## 2015-12-11 NOTE — Progress Notes (Signed)
Subjective: Rachael Jordan is a 60 y.o. female is seen today in office s/p left Austin/akin, 2nd and 3rd hammertoe repair preformed on 11/15/15. She is still getting some sharp, shooting pain to her toes as she is on her feet or puts her foot down and it swells but this is improving. She presents today for suture removal. Denies any systemic complaints such as fevers, chills, nausea, vomiting. No calf pain, chest pain, shortness of breath.   Objective: General: No acute distress, AAOx3  DP/PT pulses palpable 2/4, CRT < 3 sec to all digits.  Protective sensation intact. Motor function intact.  Left foot: Incision is well coapted without any evidence of dehiscence and sutures intact. There is no surrounding erythema, ascending cellulitis, fluctuance, crepitus, malodor, drainage/purulence. There is mild edema around the surgical site but this is improving. There is mild pain along the surgical site. Toes sit in a rectus position.  No other areas of tenderness to bilateral lower extremities.  No other open lesions or pre-ulcerative lesions.  No pain with calf compression, swelling, warmth, erythema.   Assessment and Plan:  Status post left foot surgery, doing well with no complications   -Treatment options discussed including all alternatives, risks, and complications -Sutures removed today without complications. Antibiotic ointment was applied followed by dressing. Keep dressing clean, dry, intact. -Continue Cam Walker. -Ice elevation. -Pain medication as needed. Refilled Percocet today. -Monitor for any clinical signs or symptoms of infection and DVT/PE and directed to call the office immediately should any occur or go to the ER. -Follow-up as scheduled or sooner if any problems arise. In the meantime, encouraged to call the office with any questions, concerns, change in symptoms.   Celesta Gentile, DPM

## 2015-12-14 DIAGNOSIS — R202 Paresthesia of skin: Secondary | ICD-10-CM | POA: Insufficient documentation

## 2015-12-21 ENCOUNTER — Ambulatory Visit (INDEPENDENT_AMBULATORY_CARE_PROVIDER_SITE_OTHER): Payer: BLUE CROSS/BLUE SHIELD

## 2015-12-21 ENCOUNTER — Encounter: Payer: Self-pay | Admitting: Podiatry

## 2015-12-21 ENCOUNTER — Ambulatory Visit (INDEPENDENT_AMBULATORY_CARE_PROVIDER_SITE_OTHER): Payer: BLUE CROSS/BLUE SHIELD | Admitting: Podiatry

## 2015-12-21 DIAGNOSIS — M205X9 Other deformities of toe(s) (acquired), unspecified foot: Secondary | ICD-10-CM

## 2015-12-21 DIAGNOSIS — Z09 Encounter for follow-up examination after completed treatment for conditions other than malignant neoplasm: Secondary | ICD-10-CM

## 2015-12-21 DIAGNOSIS — M2012 Hallux valgus (acquired), left foot: Secondary | ICD-10-CM

## 2015-12-21 NOTE — Progress Notes (Signed)
Subjective: Rachael Jordan is a 60 y.o. female is seen today in office s/p left Austin/akin, 2nd and 3rd hammertoe repair preformed on 11/15/15. She said that she is doing better and she has not been taking any pain medicine. She has been continuing to walk in the cam boot. She presents today for pin removal. The sharp pains have improved as well. Denies any systemic complaints such as fevers, chills, nausea, vomiting. No calf pain, chest pain, shortness of breath.   Objective: General: No acute distress, AAOx3  DP/PT pulses palpable 2/4, CRT < 3 sec to all digits.  Protective sensation intact. Motor function intact.  Left foot: Incision is well coapted without any evidence of dehiscence and a scar has formed. K wire intact the second toe. Toes sit in rectus position. There is minimal edema to the surgical sites. Mild to palpation of the surgery sites however they appear to be improved. There is no one erythema, increase in warmth there is no drainage or any signs of infection.  No other areas of tenderness to bilateral lower extremities.  No other open lesions or pre-ulcerative lesions.  No pain with calf compression, swelling, warmth, erythema.   Assessment and Plan:  Status post left foot surgery, doing well with no complications   -Treatment options discussed including all alternatives, risks, and complications -X-rays obtained and reviewed. Hardware intact. Increased consolidation across the osteotomy site. No evidence of acute fracture. -Daily K wire was removed in total. Robotic on it was applied followed by a bandage. -She can start range of motion exercises. Ordered physical therapy. -Transition to surgical shoe which was dispensed today. She can transition to a regular shoe for the next couple weeks as tolerated. -Continue ice and elevation. -She has vicodin at home. This was prescribed by pain management she can return to her normal prescription. -Monitor for any clinical  signs or symptoms of infection and DVT/PE and directed to call the office immediately should any occur or go to the ER. -Follow-up in 3 weeks or sooner if any problems arise. In the meantime, encouraged to call the office with any questions, concerns, change in symptoms.   Celesta Gentile, DPM

## 2015-12-28 ENCOUNTER — Encounter: Payer: Self-pay | Admitting: Podiatry

## 2015-12-28 ENCOUNTER — Ambulatory Visit (INDEPENDENT_AMBULATORY_CARE_PROVIDER_SITE_OTHER): Payer: BLUE CROSS/BLUE SHIELD | Admitting: Podiatry

## 2015-12-28 DIAGNOSIS — M205X9 Other deformities of toe(s) (acquired), unspecified foot: Secondary | ICD-10-CM

## 2015-12-28 DIAGNOSIS — Z09 Encounter for follow-up examination after completed treatment for conditions other than malignant neoplasm: Secondary | ICD-10-CM

## 2015-12-28 DIAGNOSIS — M2012 Hallux valgus (acquired), left foot: Secondary | ICD-10-CM

## 2015-12-28 DIAGNOSIS — M792 Neuralgia and neuritis, unspecified: Secondary | ICD-10-CM

## 2015-12-28 MED ORDER — GABAPENTIN 300 MG PO CAPS: 300.0000 mg | ORAL_CAPSULE | Freq: Every day | ORAL | 3 refills | Status: DC

## 2015-12-28 NOTE — Patient Instructions (Signed)
Gabapentin capsules or tablets What is this medicine? GABAPENTIN (GA ba pen tin) is used to control partial seizures in adults with epilepsy. It is also used to treat certain types of nerve pain. This medicine may be used for other purposes; ask your health care provider or pharmacist if you have questions. What should I tell my health care provider before I take this medicine? They need to know if you have any of these conditions: -kidney disease -suicidal thoughts, plans, or attempt; a previous suicide attempt by you or a family member -an unusual or allergic reaction to gabapentin, other medicines, foods, dyes, or preservatives -pregnant or trying to get pregnant -breast-feeding How should I use this medicine? Take this medicine by mouth with a glass of water. Follow the directions on the prescription label. You can take it with or without food. If it upsets your stomach, take it with food.Take your medicine at regular intervals. Do not take it more often than directed. Do not stop taking except on your doctor's advice. If you are directed to break the 600 or 800 mg tablets in half as part of your dose, the extra half tablet should be used for the next dose. If you have not used the extra half tablet within 28 days, it should be thrown away. A special MedGuide will be given to you by the pharmacist with each prescription and refill. Be sure to read this information carefully each time. Talk to your pediatrician regarding the use of this medicine in children. Special care may be needed. Overdosage: If you think you have taken too much of this medicine contact a poison control center or emergency room at once. NOTE: This medicine is only for you. Do not share this medicine with others. What if I miss a dose? If you miss a dose, take it as soon as you can. If it is almost time for your next dose, take only that dose. Do not take double or extra doses. What may interact with this medicine? Do not  take this medicine with any of the following medications: -other gabapentin products This medicine may also interact with the following medications: -alcohol -antacids -antihistamines for allergy, cough and cold -certain medicines for anxiety or sleep -certain medicines for depression or psychotic disturbances -homatropine; hydrocodone -naproxen -narcotic medicines (opiates) for pain -phenothiazines like chlorpromazine, mesoridazine, prochlorperazine, thioridazine This list may not describe all possible interactions. Give your health care provider a list of all the medicines, herbs, non-prescription drugs, or dietary supplements you use. Also tell them if you smoke, drink alcohol, or use illegal drugs. Some items may interact with your medicine. What should I watch for while using this medicine? Visit your doctor or health care professional for regular checks on your progress. You may want to keep a record at home of how you feel your condition is responding to treatment. You may want to share this information with your doctor or health care professional at each visit. You should contact your doctor or health care professional if your seizures get worse or if you have any new types of seizures. Do not stop taking this medicine or any of your seizure medicines unless instructed by your doctor or health care professional. Stopping your medicine suddenly can increase your seizures or their severity. Wear a medical identification bracelet or chain if you are taking this medicine for seizures, and carry a card that lists all your medications. You may get drowsy, dizzy, or have blurred vision. Do not drive, use  machinery, or do anything that needs mental alertness until you know how this medicine affects you. To reduce dizzy or fainting spells, do not sit or stand up quickly, especially if you are an older patient. Alcohol can increase drowsiness and dizziness. Avoid alcoholic drinks. Your mouth may get  dry. Chewing sugarless gum or sucking hard candy, and drinking plenty of water will help. The use of this medicine may increase the chance of suicidal thoughts or actions. Pay special attention to how you are responding while on this medicine. Any worsening of mood, or thoughts of suicide or dying should be reported to your health care professional right away. Women who become pregnant while using this medicine may enroll in the [place] Pregnancy Registry by calling [phone]. This registry collects information about the safety of antiepileptic drug use during pregnancy. What side effects may I notice from receiving this medicine? Side effects that you should report to your doctor or health care professional as soon as possible: -allergic reactions like skin rash, itching or hives, swelling of the face, lips, or tongue -worsening of mood, thoughts or actions of suicide or dying Side effects that usually do not require medical attention (report to your doctor or health care professional if they continue or are bothersome): -constipation -difficulty walking or controlling muscle movements -dizziness -nausea -slurred speech -tiredness -tremors -weight gain This list may not describe all possible side effects. Call your doctor for medical advice about side effects. You may report side effects to FDA at 1-800-FDA-1088. Where should I keep my medicine? Keep out of reach of children. This medicine may cause accidental overdose and death if it taken by other adults, children, or pets. Mix any unused medicine with a substance like cat litter or coffee grounds. Then throw the medicine away in a sealed container like a sealed bag or a coffee can with a lid. Do not use the medicine after the expiration date. Store at room temperature between 15 and 30 degrees C (59 and 86 degrees F). NOTE: This sheet is a summary. It may not cover all possible information. If you have  questions about this medicine, talk to your doctor, pharmacist, or health care provider.    2016, Elsevier/Gold Standard. (2013-04-30 15:26:50)

## 2015-12-28 NOTE — Progress Notes (Signed)
Subjective: Rachael Jordan is a 60 y.o. female is seen today in office s/p left Austin/akin, 2nd and 3rd hammertoe repair preformed on 11/15/15. She presents today for concerns of a burning sharp pain to the top of her foot going the toes. She states that she's had this since the surgery and has been improving but does continue. She states that she has continued icing elevating the area and she has been wearing surgical shoe walking with the use of a crutch help take pressure off the foot. She has been doing range of motion exercises for the toe joints. Denies any systemic complaints such as fevers, chills, nausea, vomiting. No calf pain, chest pain, shortness of breath.   Objective: General: No acute distress, AAOx3  DP/PT pulses palpable 2/4, CRT < 3 sec to all digits.  Protective sensation intact. Motor function intact.  Left foot: Incision is well coapted without any evidence of dehiscence and a scar has formed. The toe sits in rectus position. There is mild to palpation on surgical sites. There is a mild decrease range of motion of MTPJ's of the first second with mild discomfort with MPJ range of motion. There is mild edema to the foot without any associated erythema or increase in warmth. There is no drainage from the incision is no clinical signs of infection. Subjective there is sharp pains and some of course the superficial peroneal nerve I dorsal osteophyte of the foot. No other areas of tenderness are identified bilaterally. No other areas of tenderness to bilateral lower extremities.  No other open lesions or pre-ulcerative lesions.  No pain with calf compression, swelling, warmth, erythema.   Assessment and Plan:  Status post left foot surgery, neuritis  -Treatment options discussed including all alternatives, risks, and complications -Continue range of motion exercises as well as icing to the area. Continue with compression to help with swelling. Prescribed gabapentin to take at  nighttime to help the nerve pain and discussed side effects. She was on this medication previously but she has not taken it in some time she does not have any at home. Also prescribed a topical cream for neuritis through a compound pharmacy. Vicodin as needed. Elevation. Flap in 2 weeks or sooner if needed. Call any questions or concerns the meantime.  Celesta Gentile, DPM

## 2016-01-11 ENCOUNTER — Encounter: Payer: BLUE CROSS/BLUE SHIELD | Admitting: Podiatry

## 2016-01-18 ENCOUNTER — Encounter: Payer: BLUE CROSS/BLUE SHIELD | Admitting: Podiatry

## 2016-01-18 ENCOUNTER — Ambulatory Visit (INDEPENDENT_AMBULATORY_CARE_PROVIDER_SITE_OTHER): Payer: BLUE CROSS/BLUE SHIELD | Admitting: Podiatry

## 2016-01-18 DIAGNOSIS — M2012 Hallux valgus (acquired), left foot: Secondary | ICD-10-CM

## 2016-01-18 DIAGNOSIS — M205X9 Other deformities of toe(s) (acquired), unspecified foot: Secondary | ICD-10-CM

## 2016-01-18 DIAGNOSIS — Z09 Encounter for follow-up examination after completed treatment for conditions other than malignant neoplasm: Secondary | ICD-10-CM

## 2016-01-19 ENCOUNTER — Telehealth: Payer: Self-pay | Admitting: *Deleted

## 2016-01-19 MED ORDER — GABAPENTIN 300 MG PO CAPS: 300.0000 mg | ORAL_CAPSULE | Freq: Three times a day (TID) | ORAL | 3 refills | Status: DC

## 2016-01-19 NOTE — Telephone Encounter (Addendum)
Pt states Dr. Jacqualyn Posey ordered pt to take Gabapentin 369m 1 tablet 2-3 times daily, and she needs a new rx.  Dr. WJacqualyn Poseyordered Gabapentin 302m#90 1 capsule 3 time a day +3refills. Orders to [facility]/27/2017-Pt states Dr. CrPrimus Bravoanted her to ask if Dr. WaJacqualyn Poseyould up her Gabapentin to 4-5 times daily, since Dr. WaJacqualyn Poseyrescribed. 02/21/2016-Informed pt of Dr. WaLeigh Aurorarders to increase Gabapentin to 60061mne tablet 3 times daily. I told her it would be ordered with 5 additional refills, and Dr. WagJacqualyn Poseyuld want to see her in 6 months to make certain the dose was working for her pt states understanding and that she has an appt 03/07/2016.

## 2016-01-25 NOTE — Progress Notes (Signed)
Subjective: Rachael Jordan is a 60 y.o. female is seen today in office s/p left Austin/akin, 2nd and 3rd hammertoe repair preformed on 11/15/15. She's been 1 physical therapy and she feels that she is doing better. Her pain is improving PT is helping with this. She said at some sharp pains to her toes at times. She cannot correlate any activity that brings [place] on an she states it is intermittent in nature. Swelling is also improved. Denies any systemic complaints such as fevers, chills, nausea, vomiting. No calf pain, chest pain, shortness of breath.   Objective: General: No acute distress, AAOx3  DP/PT pulses palpable 2/4, CRT < 3 sec to all digits.  Protective sensation intact. Motor function intact.  Left foot: Incision is well coapted without any evidence of dehiscence and a scar has formed. Toes are in rectus position. Mild decrease in first and second MPJ range of motion have this has apparently improved compared to last appointment. There is mild edema to the area. There is mild to palpation of the surgical sites however she states this is improving as well. Subjectively the sharp pains along the course of the superficial peroneal nerve have improved. No other areas of tenderness are identified bilaterally. No other areas of tenderness to bilateral lower extremities.  No other open lesions or pre-ulcerative lesions.  No pain with calf compression, swelling, warmth, erythema.   Assessment and Plan:  Status post left foot surgery, neuritis  -Treatment options discussed including all alternatives, risks, and complications -Continue range of motion exercises as well as icing to the area. Continue physical therapy. Continue with compression to help with swelling. She's taking gabapentin nighttime without any side effects. Discussed increasing gabapentin gradually discussed with her to this and she verbally understood. -She can start to transition to regular shoe as tolerated. -Order  compound cream for scarring. She has minimal scarring foot to help prevent future scarring she would proceed with this. -Ice/elevation -Follow-up as scheduled or sooner if any problems arise. In the meantime, encouraged to call the office with any questions, concerns, change in symptoms.   Celesta Gentile, DPM

## 2016-02-01 ENCOUNTER — Other Ambulatory Visit: Payer: Self-pay | Admitting: Pain Medicine

## 2016-02-12 ENCOUNTER — Encounter: Payer: Self-pay | Admitting: *Deleted

## 2016-02-12 NOTE — Telephone Encounter (Signed)
Can you see how much she was taking before. We can go up to 642m TID and see how she does with this. I think this is what she was on before.

## 2016-02-21 MED ORDER — GABAPENTIN 600 MG PO TABS: 600.0000 mg | ORAL_TABLET | Freq: Three times a day (TID) | ORAL | 5 refills | Status: DC

## 2016-03-07 ENCOUNTER — Ambulatory Visit (INDEPENDENT_AMBULATORY_CARE_PROVIDER_SITE_OTHER): Payer: Self-pay | Admitting: Podiatry

## 2016-03-07 DIAGNOSIS — Z09 Encounter for follow-up examination after completed treatment for conditions other than malignant neoplasm: Secondary | ICD-10-CM

## 2016-03-07 DIAGNOSIS — M205X9 Other deformities of toe(s) (acquired), unspecified foot: Secondary | ICD-10-CM

## 2016-03-07 DIAGNOSIS — M792 Neuralgia and neuritis, unspecified: Secondary | ICD-10-CM

## 2016-03-12 NOTE — Progress Notes (Signed)
Subjective: Rachael Jordan is a 60 y.o. female is seen today in office s/p left Austin/akin, 2nd and 3rd hammertoe repair preformed on 11/15/15. She states that she is doing better but she still getting sharp come burning pain to her toes which is intermittent. She is continuing the gabapentin 300 mg 3 times a day and she did not go to 600 mg quite yet due to not getting the prescription. She has returned to a regular shoe. Her swelling has been minimal. She states that she has been very active and she is on her feet all day.  Denies any systemic complaints such as fevers, chills, nausea, vomiting. No calf pain, chest pain, shortness of breath.   Objective: General: No acute distress, AAOx3  DP/PT pulses palpable 2/4, CRT < 3 sec to all digits.  Protective sensation intact. Motor function intact.  Left foot: Incision is well coapted without any evidence of dehiscence and a scar has formed. Toes are in rectus position. There is minimal tenderness on surgical sites. She still getting sharp, nerve pain to her toes intermittently. Today the sharp pains appear to be localized to the toes and does not appear to be following 1 nerve distribution.  No other areas of tenderness are identified bilaterally. No other areas of tenderness to bilateral lower extremities.  No other open lesions or pre-ulcerative lesions.  No pain with calf compression, swelling, warmth, erythema.   Assessment and Plan:  Status post left foot surgery, neuritis  -Treatment options discussed including all alternatives, risks, and complications -Recommended start to increase her gabapentin to 600 mg 3 times a day. I also discussed this with Dr. Primus Bravo and she can go up to 622m 4-5 day if needed.  -Continue range of motion exercises. -She is when I dressy, flat shoe today. Discussed with her more supportive shoe. -Ice/elevation -Follow-up as scheduled or sooner if any problems arise. In the meantime, encouraged to call the  office with any questions, concerns, change in symptoms.   MCelesta Gentile DPM

## 2016-03-20 ENCOUNTER — Other Ambulatory Visit: Payer: Self-pay | Admitting: Pain Medicine

## 2016-04-04 ENCOUNTER — Ambulatory Visit: Payer: BLUE CROSS/BLUE SHIELD | Admitting: Podiatry

## 2016-04-18 ENCOUNTER — Ambulatory Visit (INDEPENDENT_AMBULATORY_CARE_PROVIDER_SITE_OTHER): Payer: BLUE CROSS/BLUE SHIELD

## 2016-04-18 ENCOUNTER — Ambulatory Visit (INDEPENDENT_AMBULATORY_CARE_PROVIDER_SITE_OTHER): Payer: BLUE CROSS/BLUE SHIELD | Admitting: Podiatry

## 2016-04-18 DIAGNOSIS — M792 Neuralgia and neuritis, unspecified: Secondary | ICD-10-CM

## 2016-04-18 DIAGNOSIS — M2012 Hallux valgus (acquired), left foot: Secondary | ICD-10-CM | POA: Diagnosis not present

## 2016-04-18 DIAGNOSIS — Q828 Other specified congenital malformations of skin: Secondary | ICD-10-CM | POA: Diagnosis not present

## 2016-04-18 MED ORDER — GABAPENTIN 600 MG PO TABS: 600.0000 mg | ORAL_TABLET | Freq: Three times a day (TID) | ORAL | 2 refills | Status: DC

## 2016-04-19 NOTE — Progress Notes (Signed)
Subjective: Rachael Jordan is a 61 y.o. female is seen today in office s/p left Austin/akin, 2nd and 3rd hammertoe repair preformed on 11/15/15. She states that she is doing much better. Nerve pain to her feet is improved as well. She is taking 300 mg a gabapentin fortified times a day. She never increase to 600 mg. She still states that she gets some occasional sharp pains when she is on her feet for some time. She is able to wear regular shoe without any problems during the day. She is asking to wear high heels today. She also states that she's had a corn on the ball of her foot for several years and she points to submetatarsal 3. She states the area throbs a time and she is asking if this can be trimmed today.  Denies any systemic complaints such as fevers, chills, nausea, vomiting. No calf pain, chest pain, shortness of breath.   Objective: General: No acute distress, AAOx3  DP/PT pulses palpable 2/4, CRT < 3 sec to all digits.  Protective sensation intact. Motor function intact.  Left foot: Incision is well coapted without any evidence of dehiscence and a scar has formed. Toes are in rectus position. There is minimal tenderness on surgical sites. The sharp ends to her feet have greatly improved. The toes are rectus position. There is mild prominence the third metatarsal head. Under the series of small annular hyperkeratotic lesion. Upon debridement no underlying ulceration, drainage, formed body or evidence of verruca. No other areas of tenderness to bilateral lower extremities.  No other open lesions or pre-ulcerative lesions.  No pain with calf compression, swelling, warmth, erythema.   Assessment and Plan:  Status post left foot surgery with much improved symptoms; porokertosis  -Treatment options discussed including all alternatives, risks, and complications -X-rays were obtained and reviewed with the patient. Healing osteotomy sites present. Hardware intact.  -I recent gabapentin  600 mg 3 times a day to her pharmacy. We will start with this dose. She did not get the prescription after last appointment she still had 300 mg at home. -She is doing well from a surgical standpoint. We will discharge her from her postoperative course. However discussed that this any increase in pain or any problems to call the office. -Hyperkeratotic lesion sub-metatarsal 3 with sharp debrided without complications. A pad was placed around the lesion followed by salicylic acid and a bandage. Post procedure instructions were discussed. Monitor for infection.   Celesta Gentile, DPM

## 2016-04-30 NOTE — Progress Notes (Signed)
DOS 08.30.2017 Left foot surgical correction of bunion (Austin/Akin bunionectomy) hammertoe repair 2nd toe possible 3rd toe.

## 2016-07-09 NOTE — Progress Notes (Deleted)
11:14 AM   Rachael Jordan [birth year] [id number]  Referring provider: Sharyne Peach, MD [address] [place], [place] [address]  No chief complaint on file.   HPI: Patient is a 61 year old African-American female with a history of urinary frequency, gross hematuria and vaginal atrophy. She presents today for 12 month follow-up.    History of gross hematuria Negative hematuria workup completed 07/01/14 including CT urogram and cystoscopy.  She denies any further episodes of gross hematuria since her last visit.  Her UA today is negative for hematuria. ***  Urinary frequency She is currently taking daily Myrbetriq for management of her urinary frequency.   She had been out of the medication for 1 week and could tell there was a worsening of her urinary symptoms.   She reports that she is tolerating the medication well.  She does admit to only drinking a few sips of liquids per day. She states that she has no desire to drink.  The patient has been experiencing urgency x *** (***), frequency x *** (***), not/is restricting fluids to avoid visits to the restroom ***, not/is engaging in toilet mapping, incontinence x *** (***) and nocturia x *** (***).    Vaginal atrophy She has previously being treated for her vaginal atrophy with vaginal estrogen cream. She states that her dysuria completely resolved after using the cream that she has since discontinue the medication.  She does not desire to restart it at this time.  PMH: Past Medical History:  Diagnosis Date  . Acid reflux   . Anxiety   . Arthritis   . Bronchitis 09/2015  . Gross hematuria   . H. pylori infection   . Heart murmur   . Kidney failure   . Lung abnormality    "damaged lung due to pneumonia"  . Neck pain   . Nephrolithiasis   . Pneumonia   . Pneumothorax   . Scoliosis   . Urinary frequency   . Vaginal atrophy     Surgical History: Past Surgical History:  Procedure Laterality Date  . ABDOMINAL  HYSTERECTOMY     vaginal  . APPENDECTOMY    . NECK SURGERY      Home Medications:  Allergies as of 07/10/2016      Reactions   Grapefruit Bioflavonoid Complex Rash   Propoxyphene Nausea And Vomiting, Nausea Only      Medication List       Accurate as of 07/09/16 11:14 AM. Always use your most recent med list.          azelastine 0.1 % nasal spray Commonly known as:  ASTELIN Place into the nose.   cefUROXime 500 MG tablet Commonly known as:  CEFTIN Take 500 mg by mouth 2 (two) times daily with a meal.   CENTRUM ADULTS PO Take by mouth. Reported on 09/28/2015   cetirizine 10 MG tablet Commonly known as:  ZYRTEC Take 10 mg by mouth daily. Reported on 08/01/2015   gabapentin 600 MG tablet Commonly known as:  NEURONTIN Take 1 tablet (600 mg total) by mouth 3 (three) times daily.   gabapentin 600 MG tablet Commonly known as:  NEURONTIN Take 1 tablet (600 mg total) by mouth 3 (three) times daily.   HYDROcodone-acetaminophen 7.5-325 MG tablet Commonly known as:  NORCO Limit one tab by mouth per day or 3-5 times per day if tolerated   HYDROCODONE-CHLORPHENIRAMINE PO Take 5 mLs by mouth every 12 (twelve) hours as needed.   ipratropium-albuterol 0.5-2.5 (3) MG/3ML Soln  Commonly known as:  DUONEB Take 3 mLs by nebulization every 4 (four) hours as needed.   mirabegron ER 25 MG Tb24 tablet Commonly known as:  MYRBETRIQ Take 1 tablet (25 mg total) by mouth daily.   montelukast 10 MG tablet Commonly known as:  SINGULAIR Take 10 mg by mouth daily.   oxyCODONE-acetaminophen 5-325 MG tablet Commonly known as:  ROXICET Take 1 tablet by mouth every 6 (six) hours as needed for severe pain.   pantoprazole 40 MG tablet Commonly known as:  PROTONIX Take 40 mg by mouth.   phentermine 15 MG capsule Take 15 mg by mouth daily. Reported on 08/07/2015   promethazine 25 MG tablet Commonly known as:  PHENERGAN Take 25 mg by mouth every 6 (six) hours as needed for nausea or  vomiting.   tiZANidine 4 MG tablet Commonly known as:  ZANAFLEX Take 4 mg by mouth 3 (three) times daily.   topiramate 25 MG tablet Commonly known as:  TOPAMAX Limit 1 - 2 tablets by mouth per day or twice per day if tolerated  NO NEURONTIN   traZODone 100 MG tablet Commonly known as:  DESYREL Take 100 mg by mouth at bedtime.   vitamin C 500 MG tablet Commonly known as:  ASCORBIC ACID Take 500 mg by mouth daily.       Allergies:  Allergies  Allergen Reactions  . Grapefruit Bioflavonoid Complex Rash  . Propoxyphene Nausea And Vomiting and Nausea Only    Family History: Family History  Problem Relation Age of Onset  . Arthritis Mother   . Asthma Mother   . Hyperlipidemia Mother   . Hypertension Mother   . Diabetes Sister   . Cancer Maternal Aunt     esophagus  . Breast cancer Neg Hx     Social History:  reports that she has never smoked. She has never used smokeless tobacco. She reports that she does not drink alcohol or use drugs.  ROS:                                        Physical Exam: There were no vitals taken for this visit.  Constitutional:  Alert and oriented, No acute distress. HEENT: [place] AT, moist mucus membranes.  Trachea midline, no masses. Cardiovascular: No clubbing, cyanosis, or edema. Respiratory: Normal respiratory effort, no increased work of breathing. Skin: No rashes, bruises or suspicious lesions. Lymph: No cervical or inguinal adenopathy. Neurologic: Grossly intact, no focal deficits, moving all 4 extremities. Psychiatric: Normal mood and affect.  Laboratory Data:  Urinalysis ***  Pertinent imaging ***  Assessment & Plan:    1. History of gross hematuria- No further gross hematuria. Negative hematuria workup completed on 07/01/14. No microscopic hematuria noted on today's UA.  She will return in 1 year for repeat UA.  She will contact the office if she should develop gross hematuria.  - Urinalysis,  Complete  2. Urinary Frequency- PVR 0 mL. patient's symptoms  well managed on Myrbetriq.  She will continue the medication. She will follow-up in one year time for symptom recheck and PVR.  3. Vaginal Atrophy- Previously treated with vaginal estrogen cream. Patient remains asymptomatic at this time.  Will continue to monitor. She'll return in 1 year for symptom recheck.  No Follow-up on file.  These notes generated with voice recognition software. I apologize for typographical errors.  Zara Council, PA-C  [facility] [address], [place], [place] [address] [phone]

## 2016-07-10 ENCOUNTER — Ambulatory Visit: Payer: BLUE CROSS/BLUE SHIELD | Admitting: Urology

## 2016-07-29 NOTE — Progress Notes (Signed)
10:53 AM   Rachael Jordan 05/21/1955 [id number]  Referring provider: Sharyne Peach, MD [place], [place] [address]  Chief Complaint  Patient presents with  . Follow-up    1 year hx of hematuria / vaginal atrophy    HPI: Patient is a 61 year old African-American female with a history of urinary frequency, gross hematuria and vaginal atrophy. She presents today for 12 month follow-up.    History of gross hematuria Negative hematuria workup completed 07/01/14 including CT urogram and cystoscopy.  She has episodes of red tinged urine for time to time but is only occurs with morning voids.  Her UA today is negative for hematuria.   Urinary frequency She is currently taking daily Myrbetriq for management of her urinary frequency.   She had been out of the medication for 1 week and could tell there was a worsening of her urinary symptoms.   She reports that she is tolerating the medication well.  She does admit to only drinking a few sips of liquids per day. She states that she has no desire to drink.  The patient has been experiencing urgency x 4-7, frequency x 4-7, not restricting fluids to avoid visits to the restroom, is engaging in toilet mapping, incontinence x 0-3 and nocturia x 4-7.   Her PVR is 0 mL.    Vaginal atrophy She has previously being treated for her vaginal atrophy with vaginal estrogen cream. She states that her dysuria completely resolved after using the cream that she has since discontinue the medication.  She does not desire to restart it at this time.  PMH: Past Medical History:  Diagnosis Date  . Acid reflux   . Anxiety   . Arthritis   . Bronchitis 09/2015  . Gross hematuria   . H. pylori infection   . Heart murmur   . Kidney failure   . Lung abnormality    "damaged lung due to pneumonia"  . Neck pain   . Nephrolithiasis   . Pneumonia   . Pneumothorax   . Scoliosis   . Urinary frequency   . Vaginal atrophy     Surgical  History: Past Surgical History:  Procedure Laterality Date  . ABDOMINAL HYSTERECTOMY     vaginal  . APPENDECTOMY    . FOOT SURGERY Left    10/2015  . NECK SURGERY      Home Medications:  Allergies as of 07/31/2016      Reactions   Grapefruit Bioflavonoid Complex Rash   Propoxyphene Nausea And Vomiting, Nausea Only      Medication List       Accurate as of 07/31/16 10:53 AM. Always use your most recent med list.          Acetaminophen 500 MG coapsule Take by mouth.   amitriptyline 50 MG tablet Commonly known as:  ELAVIL Take 50 mg by mouth at bedtime.   azelastine 0.1 % nasal spray Commonly known as:  ASTELIN Place into the nose.   Calcium Carbonate-Vitamin D3 600-400 MG-UNIT Tabs Take by mouth.   cefUROXime 500 MG tablet Commonly known as:  CEFTIN Take 500 mg by mouth 2 (two) times daily with a meal.   CENTRUM ADULTS PO Take by mouth. Reported on 09/28/2015   cetirizine 10 MG tablet Commonly known as:  ZYRTEC Take 10 mg by mouth daily. Reported on 08/01/2015   conjugated estrogens vaginal cream Commonly known as:  PREMARIN Place 1 Applicatorful vaginally daily. Apply 0.10m (pea-sized amount)  just  inside the vaginal introitus with a finger-tip every night for two weeks and then Monday, Wednesday and Friday nights.   docusate sodium 100 MG capsule Commonly known as:  COLACE Take by mouth.   estradiol 0.1 MG/GM vaginal cream Commonly known as:  ESTRACE VAGINAL Apply 0.42m (pea-sized amount)  just inside the vaginal introitus with a finger-tip every night for two weeks and then Monday, Wednesday and Friday nights.   gabapentin 600 MG tablet Commonly known as:  NEURONTIN Take 1 tablet (600 mg total) by mouth 3 (three) times daily.   gabapentin 600 MG tablet Commonly known as:  NEURONTIN Take 1 tablet (600 mg total) by mouth 3 (three) times daily.   HYDROcodone-acetaminophen 10-325 MG tablet Commonly known as:  NORCO Take 1 tablet by mouth every 6 (six)  hours as needed.   HYDROcodone-acetaminophen 7.5-325 MG tablet Commonly known as:  NORCO Limit one tab by mouth per day or 3-5 times per day if tolerated   HYDROCODONE-CHLORPHENIRAMINE PO Take 5 mLs by mouth every 12 (twelve) hours as needed.   ipratropium-albuterol 0.5-2.5 (3) MG/3ML Soln Commonly known as:  DUONEB Take 3 mLs by nebulization every 4 (four) hours as needed.   mirabegron ER 25 MG Tb24 tablet Commonly known as:  MYRBETRIQ Take 1 tablet (25 mg total) by mouth daily.   montelukast 10 MG tablet Commonly known as:  SINGULAIR Take 10 mg by mouth daily.   naproxen 500 MG EC tablet Commonly known as:  EC NAPROSYN Take by mouth.   OMEPRAZOLE PO Take by mouth.   oxyCODONE-acetaminophen 5-325 MG tablet Commonly known as:  ROXICET Take 1 tablet by mouth every 6 (six) hours as needed for severe pain.   pantoprazole 40 MG tablet Commonly known as:  PROTONIX Take 40 mg by mouth.   phentermine 15 MG capsule Take 15 mg by mouth daily. Reported on 08/07/2015   potassium chloride SA 20 MEQ tablet Commonly known as:  K-DUR,KLOR-CON Take 3 tablets by mouth daily for 7 days   promethazine 25 MG tablet Commonly known as:  PHENERGAN Take 25 mg by mouth every 6 (six) hours as needed for nausea or vomiting.   promethazine 25 MG tablet Commonly known as:  PHENERGAN Take 25 mg by mouth every 8 (eight) hours as needed for nausea or vomiting (take 1 tablet by mouth every eight hours as needed for nausea and/or vomiting).   rizatriptan 10 MG tablet Commonly known as:  MAXALT Take 10 mg by mouth as needed for migraine. May repeat in 2 hours if needed   tiZANidine 4 MG tablet Commonly known as:  ZANAFLEX Take 4 mg by mouth 3 (three) times daily.   topiramate 25 MG tablet Commonly known as:  TOPAMAX Limit 1 - 2 tablets by mouth per day or twice per day if tolerated  NO NEURONTIN   traZODone 100 MG tablet Commonly known as:  DESYREL Take 100 mg by mouth at bedtime.    valACYclovir 1000 MG tablet Commonly known as:  VALTREX Take by mouth.   vitamin B-12 1000 MCG tablet Commonly known as:  CYANOCOBALAMIN Take by mouth.   vitamin C 500 MG tablet Commonly known as:  ASCORBIC ACID Take 500 mg by mouth daily.       Allergies:  Allergies  Allergen Reactions  . Grapefruit Bioflavonoid Complex Rash  . Propoxyphene Nausea And Vomiting and Nausea Only    Family History: Family History  Problem Relation Age of Onset  . Arthritis Mother   .  Asthma Mother   . Hyperlipidemia Mother   . Hypertension Mother   . Diabetes Sister   . Cancer Maternal Aunt        esophagus  . Breast cancer Neg Hx   . Kidney cancer Neg Hx   . Bladder Cancer Neg Hx     Social History:  reports that she has never smoked. She has never used smokeless tobacco. She reports that she does not drink alcohol or use drugs.  ROS: UROLOGY Frequent Urination?: Yes Hard to postpone urination?: No Burning/pain with urination?: No Get up at night to urinate?: Yes Leakage of urine?: Yes Urine stream starts and stops?: No Trouble starting stream?: No Do you have to strain to urinate?: No Blood in urine?: No Urinary tract infection?: No Sexually transmitted disease?: No Injury to kidneys or bladder?: No Painful intercourse?: No Weak stream?: No Currently pregnant?: No Vaginal bleeding?: No Last menstrual period?: n  Gastrointestinal Nausea?: No Vomiting?: Yes Indigestion/heartburn?: No Diarrhea?: No Constipation?: No  Constitutional Fever: No Night sweats?: Yes Weight loss?: No Fatigue?: No  Skin Skin rash/lesions?: No Itching?: No  Eyes Blurred vision?: Yes Double vision?: No  Ears/Nose/Throat Sore throat?: No Sinus problems?: Yes  Hematologic/Lymphatic Swollen glands?: No Easy bruising?: No  Cardiovascular Leg swelling?: No Chest pain?: No  Respiratory Cough?: No Shortness of breath?: No  Endocrine Excessive thirst?:  No  Musculoskeletal Back pain?: Yes Joint pain?: Yes  Neurological Headaches?: Yes Dizziness?: No  Psychologic Depression?: No Anxiety?: No  Physical Exam: BP (!) 144/88   Pulse (!) 102   Ht 5' 5"  (1.651 m)   Wt 166 lb 6.4 oz (75.5 kg)   BMI 27.69 kg/m   Constitutional: Well nourished. Alert and oriented, No acute distress. HEENT: [place] AT, moist mucus membranes. Trachea midline, no masses. Cardiovascular: No clubbing, cyanosis, or edema. Respiratory: Normal respiratory effort, no increased work of breathing. GI: Abdomen is soft, non tender, non distended, no abdominal masses. Liver and spleen not palpable.  No hernias appreciated.  Stool sample for occult testing is not indicated.   GU: No CVA tenderness.  No bladder fullness or masses. Atrophic external genitalia, normal pubic hair distribution, no lesions.  Normal urethral meatus, no lesions, no prolapse, no discharge.   No urethral masses, tenderness and/or tenderness. No bladder fullness, tenderness or masses. Pale vagina mucosa, poor estrogen effect, no discharge, no lesions, good pelvic support, no cystocele or rectocele noted.  Cervix and uterus are surgically absent.  No adnexal/parametria masses or tenderness noted.  Anus and perineum are without rashes or lesions.    Skin: No rashes, bruises or suspicious lesions. Lymph: No cervical or inguinal adenopathy. Neurologic: Grossly intact, no focal deficits, moving all 4 extremities. Psychiatric: Normal mood and affect.   Laboratory Data:  Urinalysis Unremarkable.  See EPIC.    Pertinent imaging Results for WYNELL HALBERG, Dazja (MRN [id number]) as of 07/31/2016 10:39  Ref. Range 07/31/2016 10:30  Scan Result Unknown 0    Assessment & Plan:    1. History of gross hematuria- Occasional red tinged urine.  Negative hematuria workup completed on 07/01/14.  No microscopic hematuria noted on today's UA.  She will return in 1 year for repeat UA.  She will contact the office  if she should develop gross hematuria.  - Urinalysis, Complete  2. Urinary Frequency- PVR 0 mL. patient's symptoms  well managed on Myrbetriq.  Needs a refill.  She will continue the medication. She will follow-up in one year time for symptom recheck  and PVR.  3. Vaginal Atrophy- Restart vaginal estrogen cream. Patient remains asymptomatic at this time.  Will continue to monitor. She'll return in 1 year for symptom recheck.  Return in about 1 year (around 07/31/2017) for OAB questionnaire, UA, PVR and exam.  These notes generated with voice recognition software. I apologize for typographical errors.  Zara Council, [facility] [address] [place], [place] [address] [phone]

## 2016-07-31 ENCOUNTER — Ambulatory Visit: Payer: BLUE CROSS/BLUE SHIELD | Admitting: Urology

## 2016-07-31 ENCOUNTER — Encounter: Payer: Self-pay | Admitting: Urology

## 2016-07-31 VITALS — BP 144/88 | HR 102 | Ht 65.0 in | Wt 166.4 lb

## 2016-07-31 DIAGNOSIS — Z87448 Personal history of other diseases of urinary system: Secondary | ICD-10-CM

## 2016-07-31 DIAGNOSIS — R350 Frequency of micturition: Secondary | ICD-10-CM

## 2016-07-31 DIAGNOSIS — N952 Postmenopausal atrophic vaginitis: Secondary | ICD-10-CM

## 2016-07-31 LAB — URINALYSIS, COMPLETE
BILIRUBIN UA: NEGATIVE
Glucose, UA: NEGATIVE
Ketones, UA: NEGATIVE
Leukocytes, UA: NEGATIVE
Nitrite, UA: NEGATIVE
PH UA: 7.5 (ref 5.0–7.5)
Protein, UA: NEGATIVE
Specific Gravity, UA: 1.01 (ref 1.005–1.030)
UUROB: 0.2 mg/dL (ref 0.2–1.0)

## 2016-07-31 LAB — BLADDER SCAN AMB NON-IMAGING: SCAN RESULT: 0

## 2016-07-31 MED ORDER — ESTROGENS, CONJUGATED 0.625 MG/GM VA CREA: 1.0000 | TOPICAL_CREAM | Freq: Every day | VAGINAL | 12 refills | Status: DC

## 2016-07-31 MED ORDER — ESTRADIOL 0.1 MG/GM VA CREA
TOPICAL_CREAM | VAGINAL | 12 refills | Status: DC
Start: 2016-07-31 — End: 2017-06-08

## 2016-07-31 MED ORDER — MIRABEGRON ER 25 MG PO TB24: 25.0000 mg | ORAL_TABLET | Freq: Every day | ORAL | 11 refills | Status: DC

## 2016-08-14 ENCOUNTER — Telehealth: Payer: Self-pay

## 2016-08-14 NOTE — Telephone Encounter (Signed)
PA for myrbetriq has been DENIED. Pt is to try darfenacin, oxybutynin, tolterodine, trospium.

## 2016-09-02 ENCOUNTER — Other Ambulatory Visit: Payer: Self-pay | Admitting: Family Medicine

## 2016-09-02 DIAGNOSIS — Z1231 Encounter for screening mammogram for malignant neoplasm of breast: Secondary | ICD-10-CM

## 2016-09-14 IMAGING — MR MR LUMBAR SPINE W/O CM
4 of 5 series · 25 of 48 positions shown · non-contrast
Comparison: Report of MRI lumbar spine 06/02/2003. CT of the
abdomen and pelvis 05/11/2014

CLINICAL DATA: Lumbar radiculopathy. Low back pain extending into
the right lower extremity to the foot. Numbness and weakness in the
right leg.

EXAM:
MRI LUMBAR SPINE WITHOUT CONTRAST
TECHNIQUE: Multiplanar, multisequence MR imaging of the lumbar spine was
performed. No intravenous contrast was administered.

[Series 3: T2 · sagittal · 4.0mm · 0.81mm/px · 5 of 14 slices shown (1 of 2)]
[im 1/14]
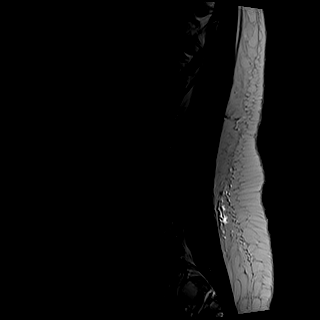
[im 4/14]
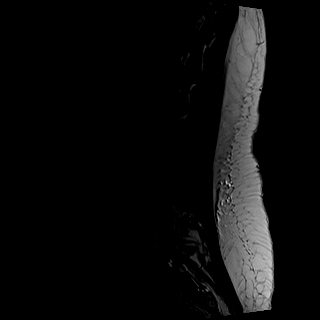
[im 7/14]
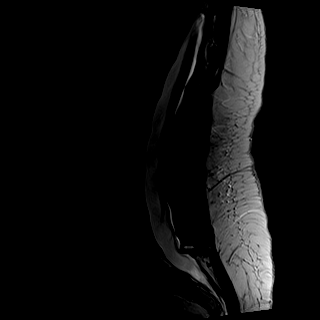
[im 10/14]
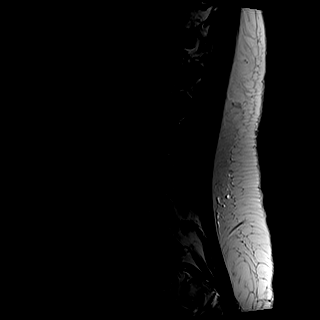
[im 14/14]
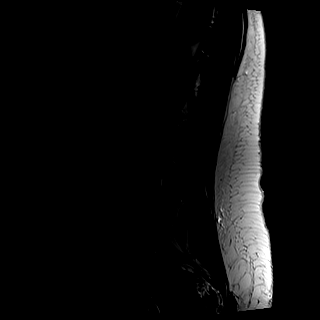

[Series 4: T1 · sagittal · 4.0mm · 0.41mm/px · 5 of 14 slices shown (1 of 2)]
[im 1/14]
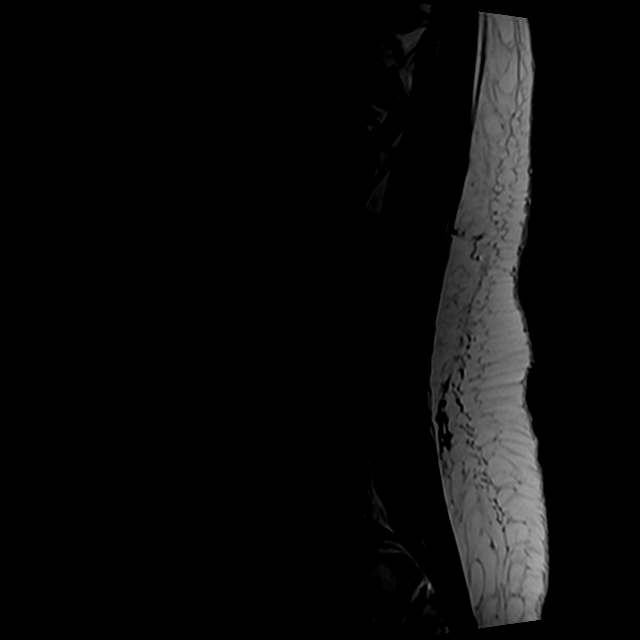
[im 4/14]
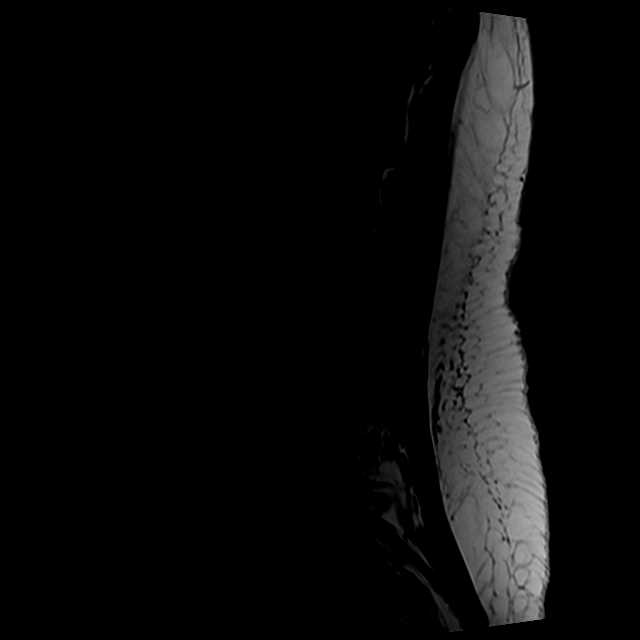
[im 7/14]
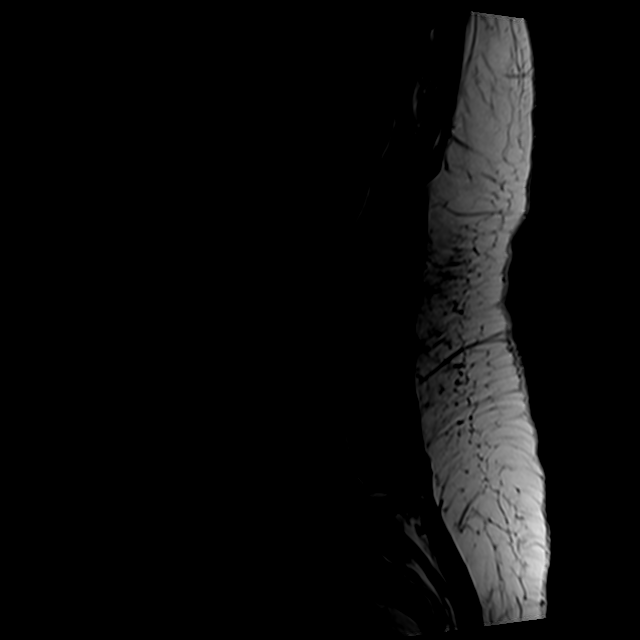
[im 10/14]
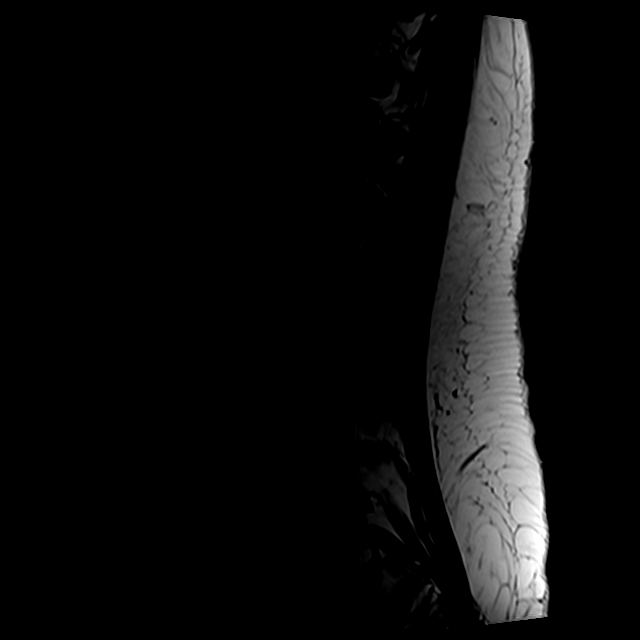
[im 14/14]
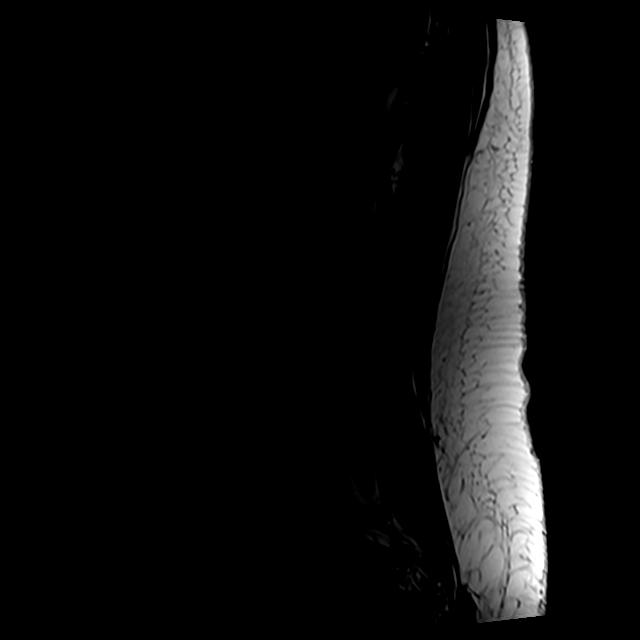

[Series 6: T2 · axial · 4.0mm · 0.78mm/px · z∈[-55,+162]mm · 10 of 39 slices shown (2 of 2)]
[im 3/39]
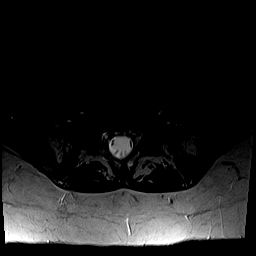
[im 6/39]
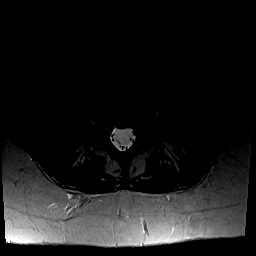
[im 8/39]
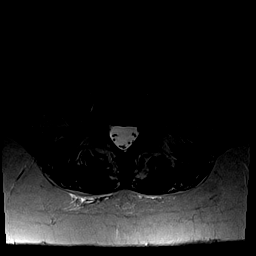
[im 13/39]
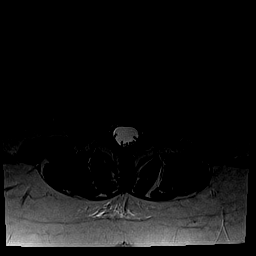
[im 18/39]
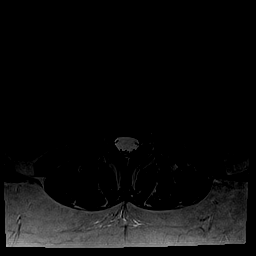
[im 21/39]
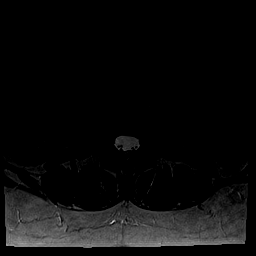
[im 23/39]
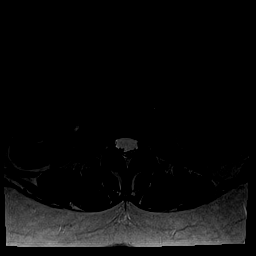
[im 28/39]
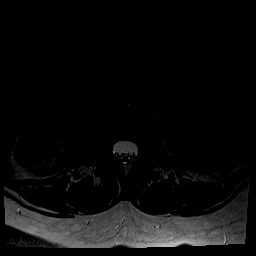
[im 33/39]
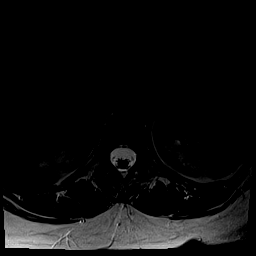
[im 39/39]
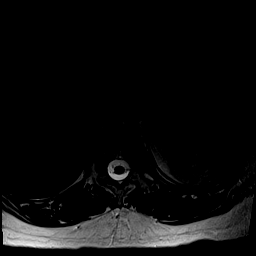

[Series 7: T1 · axial · 4.0mm · 0.31mm/px · z∈[-55,+133]mm · 5 of 39 slices shown (2 of 2)]
[im 3/39]
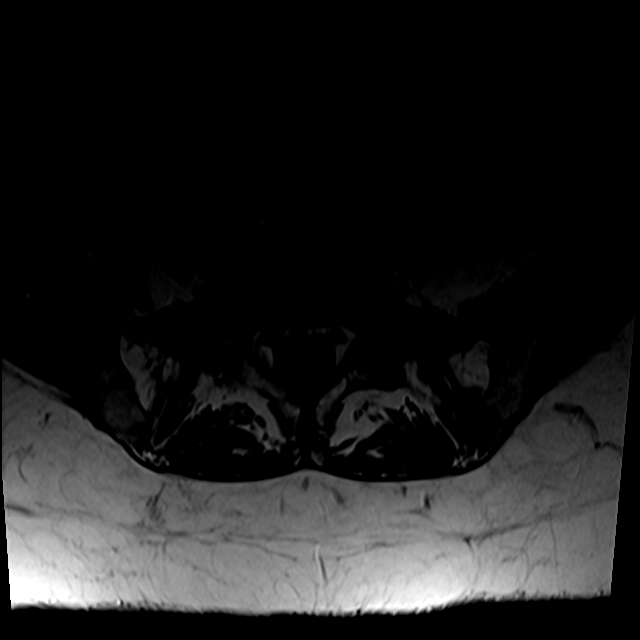
[im 6/39]
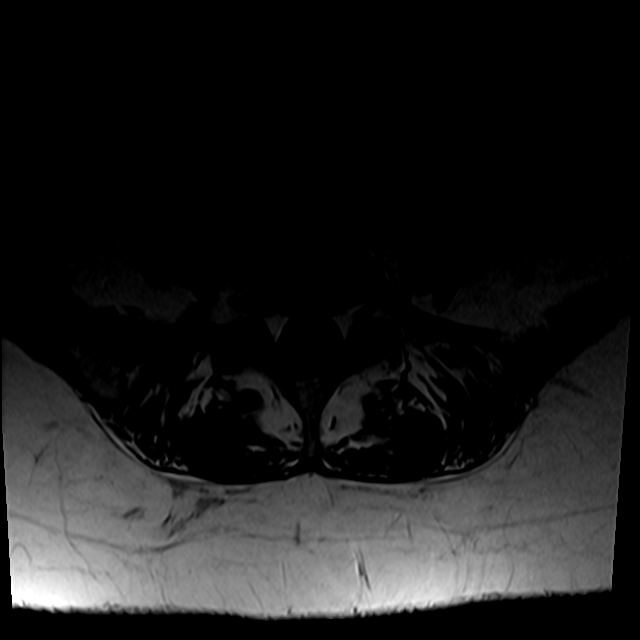
[im 8/39]
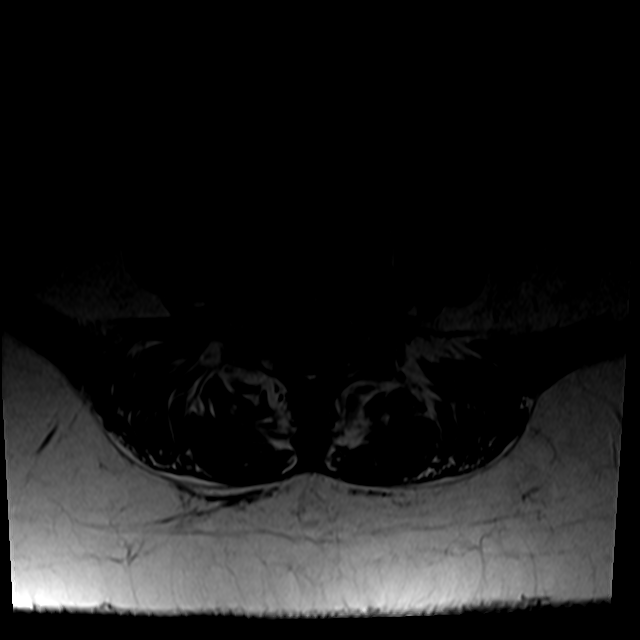
[im 21/39]
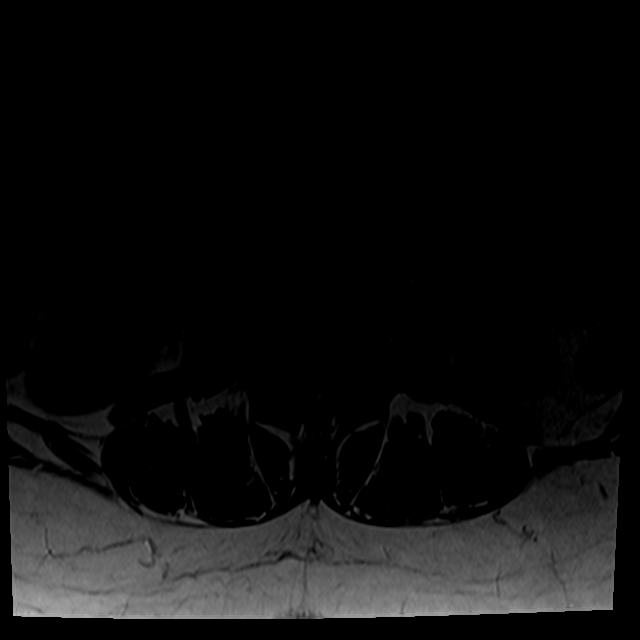
[im 33/39]
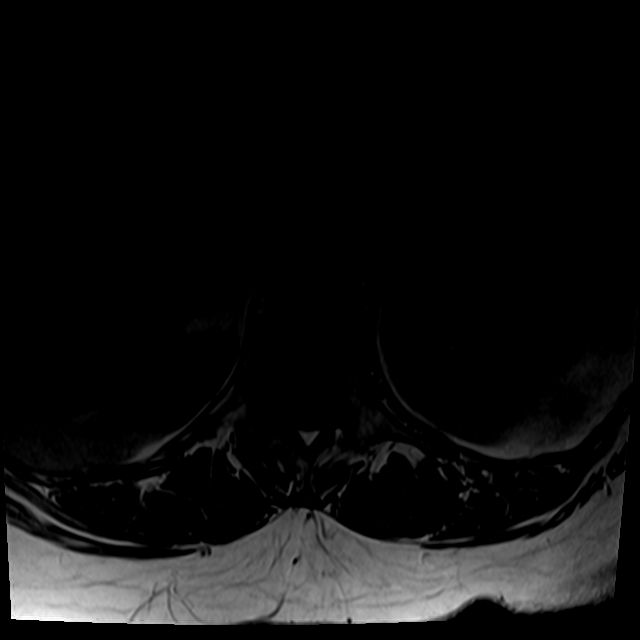

[25 of 48 positions shown; findings below may reference images not displayed]

FINDINGS: Normal signal is present in the conus medullaris which terminates at
L1-2. Marrow signal, vertebral body heights, alignment are normal.

Limited imaging of the abdomen is unremarkable. There is no
significant adenopathy.

The disc levels at L4-5 and above are normal.

L5-S1: A shallow rightward disc protrusion is present. There is mild
right subarticular narrowing. Mild facet hypertrophy is present
bilaterally without significant foraminal stenosis.
IMPRESSION: 1. Shallow rightward disc protrusion with mild right subarticular
narrowing.
2. Mild facet hypertrophy at L5-S1 without significant foraminal
stenosis.
3. No other significant disc protrusion or stenosis.

## 2016-09-16 ENCOUNTER — Ambulatory Visit
Admission: RE | Admit: 2016-09-16 | Discharge: 2016-09-16 | Disposition: A | Discharge status: Home / Self Care | Payer: BLUE CROSS/BLUE SHIELD | Source: Ambulatory Visit | Attending: Family Medicine | Admitting: Family Medicine

## 2016-09-16 DIAGNOSIS — Z1231 Encounter for screening mammogram for malignant neoplasm of breast: Secondary | ICD-10-CM | POA: Insufficient documentation

## 2016-10-28 ENCOUNTER — Ambulatory Visit
Admission: RE | Admit: 2016-10-28 | Discharge: 2016-10-28 | Disposition: A | Discharge status: Home / Self Care | Payer: BLUE CROSS/BLUE SHIELD | Source: Ambulatory Visit | Attending: Unknown Physician Specialty | Admitting: Unknown Physician Specialty

## 2016-10-28 ENCOUNTER — Other Ambulatory Visit: Payer: Self-pay | Admitting: Unknown Physician Specialty

## 2016-10-28 DIAGNOSIS — M5416 Radiculopathy, lumbar region: Secondary | ICD-10-CM | POA: Diagnosis present

## 2016-10-28 DIAGNOSIS — M8588 Other specified disorders of bone density and structure, other site: Secondary | ICD-10-CM | POA: Diagnosis not present

## 2016-10-28 DIAGNOSIS — R1031 Right lower quadrant pain: Secondary | ICD-10-CM

## 2017-01-08 DIAGNOSIS — R209 Unspecified disturbances of skin sensation: Secondary | ICD-10-CM | POA: Insufficient documentation

## 2017-03-23 ENCOUNTER — Emergency Department
Admission: EM | Admit: 2017-03-23 | Discharge: 2017-03-23 | Disposition: A | Discharge status: Home / Self Care | Payer: BLUE CROSS/BLUE SHIELD | Attending: Emergency Medicine | Admitting: Emergency Medicine

## 2017-03-23 ENCOUNTER — Encounter: Payer: Self-pay | Admitting: Intensive Care

## 2017-03-23 DIAGNOSIS — M545 Low back pain: Secondary | ICD-10-CM | POA: Diagnosis present

## 2017-03-23 DIAGNOSIS — M5432 Sciatica, left side: Secondary | ICD-10-CM

## 2017-03-23 DIAGNOSIS — M5442 Lumbago with sciatica, left side: Secondary | ICD-10-CM | POA: Insufficient documentation

## 2017-03-23 DIAGNOSIS — N183 Chronic kidney disease, stage 3 (moderate): Secondary | ICD-10-CM | POA: Diagnosis not present

## 2017-03-23 DIAGNOSIS — G8929 Other chronic pain: Secondary | ICD-10-CM

## 2017-03-23 LAB — URINALYSIS, COMPLETE (UACMP) WITH MICROSCOPIC
BILIRUBIN URINE: NEGATIVE
Bacteria, UA: NONE SEEN
GLUCOSE, UA: NEGATIVE mg/dL
HGB URINE DIPSTICK: NEGATIVE
Ketones, ur: NEGATIVE mg/dL
Leukocytes, UA: NEGATIVE
NITRITE: NEGATIVE
Protein, ur: NEGATIVE mg/dL
SPECIFIC GRAVITY, URINE: 1.018 (ref 1.005–1.030)
pH: 6 (ref 5.0–8.0)

## 2017-03-23 MED ORDER — METHOCARBAMOL 500 MG PO TABS
1000.0000 mg | ORAL_TABLET | Freq: Once | ORAL | Status: AC
Start: 2017-03-23 — End: 2017-03-23
  Administered 2017-03-23: 1000 mg via ORAL
  Filled 2017-03-23: qty 2

## 2017-03-23 MED ORDER — KETOROLAC TROMETHAMINE 30 MG/ML IJ SOLN
30.0000 mg | Freq: Once | INTRAMUSCULAR | Status: AC
Start: 2017-03-23 — End: 2017-03-23
  Administered 2017-03-23: 30 mg via INTRAMUSCULAR
  Filled 2017-03-23: qty 1

## 2017-03-23 NOTE — ED Provider Notes (Signed)
[facility] Emergency Department Provider Note   ____________________________________________   First MD Initiated Contact with Patient 03/23/17 [phone]     (approximate)  I have reviewed the triage vital signs and the nursing notes.   HISTORY  Chief Complaint Back Pain (Lside pain, back pain, bilateral leg pain)   HPI Rachael Jordan is a 62 y.o. female does have complaint of low back pain with left-sided radiculopathy. Patient has a history of arthritis and is also currently seeing her doctor for the same. She also is taking Zanaflex and Percocet. She denies any urinary symptoms at this time. She denies any saddle anesthesias or incontinence of bowel or bladder. Patient denies any recent injury.she rates her pain as 10 over 10.   Past Medical History:  Diagnosis Date  . Acid reflux   . Anxiety   . Arthritis   . Bronchitis 09/2015  . Gross hematuria   . H. pylori infection   . Heart murmur   . Kidney failure   . Lung abnormality    "damaged lung due to pneumonia"  . Neck pain   . Nephrolithiasis   . Pneumonia   . Pneumothorax   . Scoliosis   . Urinary frequency   . Vaginal atrophy     Patient Active Problem List   Diagnosis Date Noted  . History of hematuria 07/12/2015  . Urinary frequency 07/12/2015  . Vaginal atrophy 07/12/2015  . HAV (hallux abducto valgus) 07/02/2015  . Overlapping toe 07/02/2015  . Hammertoe 07/02/2015  . LBP (low back pain) 01/17/2015  . Trochanteric bursitis of right hip 01/17/2015  . PNA (pneumonia) 12/28/2014  . DDD (degenerative disc disease), lumbar 08/15/2014  . Lumbar radicular syndrome 08/15/2014  . 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 DDD (degenerative disc disease), cervical 08/15/2014  . Bilateral occipital neuralgia 08/15/2014  . Migraine 08/15/2014  . Sacroiliac joint dysfunction 08/15/2014  . Back pain, chronic 09/23/2013  . Chronic cervical pain  09/23/2013  . Arthralgia of temporomandibular joint 08/29/2013  . Absolute anemia 08/15/2013  . Chronic kidney disease (CKD), stage III (moderate) ([place]) 08/15/2013  . Abnormal serum level of alkaline phosphatase 08/15/2013  . Blush 08/15/2013  . Hematuria, microscopic 08/15/2013  . Inflamed nasal mucosa 08/15/2013  . Big thyroid 08/15/2013  . Disease of thyroid gland 08/15/2013  . Gastrointestinal ulcer due to Helicobacter pylori 93/81/8299  . Other specified bacterial intestinal infections 08/14/2013  . Kidney failure 08/09/2013  . SIRS (systemic inflammatory response syndrome) ([place]) 08/09/2013  . Systemic inflammatory response syndrome (SIRS) ([place]) 08/09/2013    Past Surgical History:  Procedure Laterality Date  . ABDOMINAL HYSTERECTOMY     vaginal  . APPENDECTOMY    . FOOT SURGERY Left    10/2015  . NECK SURGERY      Prior to Admission medications   Medication Sig Start Date End Date Taking? Authorizing Provider  Acetaminophen 500 MG coapsule Take by mouth.    [provider]  amitriptyline (ELAVIL) 50 MG tablet Take 50 mg by mouth at bedtime.    [provider]  azelastine (ASTELIN) 0.1 % nasal spray Place into the nose. 07/05/15 07/04/16  [provider]  Calcium Carbonate-Vitamin D3 600-400 MG-UNIT TABS Take by mouth.    [provider]  cefUROXime (CEFTIN) 500 MG tablet Take 500 mg by mouth 2 (two) times daily with a meal.    [provider]  cephALEXin (KEFLEX) 500 MG capsule Take 500 mg by mouth  3 (three) times daily.    [provider]  cetirizine (ZYRTEC) 10 MG tablet Take 10 mg by mouth daily. Reported on 08/01/2015    [provider]  conjugated estrogens (PREMARIN) vaginal cream Place 1 Applicatorful vaginally daily. Apply 0.64m (pea-sized amount)  just inside the vaginal introitus with a finger-tip every night for two weeks and then Monday, Wednesday and Friday nights. 07/31/16   MZara CouncilA, PA-C    docusate sodium (COLACE) 100 MG capsule Take by mouth.    [provider]  estradiol (ESTRACE VAGINAL) 0.1 MG/GM vaginal cream Apply 0.515m(pea-sized amount)  just inside the vaginal introitus with a finger-tip every night for two weeks and then Monday, Wednesday and Friday nights. 07/31/16   McZara Council, PA-C  gabapentin (NEURONTIN) 600 MG tablet Take 1 tablet (600 mg total) by mouth 3 (three) times daily. Patient not taking: Reported on 07/31/2016 02/21/16   WaTrula SladeDPM  gabapentin (NEURONTIN) 600 MG tablet Take 1 tablet (600 mg total) by mouth 3 (three) times daily. 04/18/16   WaTrula SladeDPM  HYDROcodone-acetaminophen (NORCO) 10-325 MG tablet Take 1 tablet by mouth every 6 (six) hours as needed.    [provider]  HYDROcodone-acetaminophen (NORCO) 7.5-325 MG tablet Limit one tab by mouth per day or 3-5 times per day if tolerated 10/24/15   CrMohammed KindleMD  HYDROCODONE-CHLORPHENIRAMINE PO Take 5 mLs by mouth every 12 (twelve) hours as needed.    [provider]  ipratropium-albuterol (DUONEB) 0.5-2.5 (3) MG/3ML SOLN Take 3 mLs by nebulization every 4 (four) hours as needed. 01/13/15   Triplett, CaJohnette Abraham, FNP  mirabegron ER (MYRBETRIQ) 25 MG TB24 tablet Take 1 tablet (25 mg total) by mouth daily. 07/31/16   McZara Council, PA-C  montelukast (SINGULAIR) 10 MG tablet Take 10 mg by mouth daily.    [provider]  Multiple Vitamins-Minerals (CENTRUM ADULTS PO) Take by mouth. Reported on 09/28/2015    [provider]  OMEPRAZOLE PO Take by mouth.    [provider]  oxyCODONE-acetaminophen (PERCOCET/ROXICET) 5-325 MG tablet Take 1 tablet by mouth every 4 (four) hours as needed for severe pain (take 1 tablet every four to six hours as needed for pain).    [provider]  oxyCODONE-acetaminophen (ROXICET) 5-325 MG tablet Take 1 tablet by mouth every 6 (six) hours as needed for severe pain. Patient not taking:  Reported on 07/31/2016 12/07/15   WaTrula SladeDPM  pantoprazole (PROTONIX) 40 MG tablet Take 40 mg by mouth.    [provider]  phentermine 15 MG capsule Take 15 mg by mouth daily. Reported on 08/07/2015    [provider]  potassium chloride SA (K-DUR,KLOR-CON) 20 MEQ tablet Take 3 tablets by mouth daily for 7 days 08/06/15   [provider]  promethazine (PHENERGAN) 25 MG tablet Take 25 mg by mouth every 6 (six) hours as needed for nausea or vomiting.    [provider]  promethazine (PHENERGAN) 25 MG tablet Take 25 mg by mouth every 8 (eight) hours as needed for nausea or vomiting (take 1 tablet by mouth every eight hours as needed for nausea and/or vomiting).    [provider]  rizatriptan (MAXALT) 10 MG tablet Take 10 mg by mouth as needed for migraine. May repeat in 2 hours if needed    [provider]  tiZANidine (ZANAFLEX) 4 MG tablet Take 4 mg by mouth 3 (three) times daily.  [provider]  topiramate (TOPAMAX) 25 MG tablet Limit 1 - 2 tablets by mouth per day or twice per day if tolerated  NO NEURONTIN Patient not taking: Reported on 07/31/2016 10/24/15   Mohammed Kindle, MD  traZODone (DESYREL) 100 MG tablet Take 100 mg by mouth at bedtime.    [provider]  vitamin C (ASCORBIC ACID) 500 MG tablet Take 500 mg by mouth daily.    [provider]    Allergies Grapefruit bioflavonoid complex; Propoxyphene; and Prednisolone  Family History  Problem Relation Age of Onset  . Arthritis Mother   . Asthma Mother   . Hyperlipidemia Mother   . Hypertension Mother   . Diabetes Sister   . Cancer Maternal Aunt        esophagus  . Breast cancer Neg Hx   . Kidney cancer Neg Hx   . Bladder Cancer Neg Hx     Social History Social History   Tobacco Use  . Smoking status: Never Smoker  . Smokeless tobacco: Never Used  Substance Use Topics  . Alcohol use: No    Alcohol/week: 0.0 oz  . Drug use: No     Review of Systems Constitutional: No fever/chills Eyes: No visual changes. ENT: No sore throat. Denies ear pain. Cardiovascular: Denies chest pain. Respiratory: Denies shortness of breath. Gastrointestinal: No abdominal pain.  No nausea, no vomiting.   Genitourinary: Negative for dysuria. Musculoskeletal: positive for chronic low back pain. Positive for radiculopathy. Skin: Negative for rash. Neurological: Negative for headaches, focal weakness or numbness. ____________________________________________   PHYSICAL EXAM:  VITAL SIGNS: ED Triage Vitals  Enc Vitals Group     BP 03/23/17 0753 (!) 148/80     Pulse Rate 03/23/17 0753 98     Resp 03/23/17 0753 16     Temp 03/23/17 0753 98.2 F (36.8 C)     Temp Source 03/23/17 0753 Oral     SpO2 03/23/17 0753 100 %     Weight 03/23/17 0752 176 lb (79.8 kg)     Height 03/23/17 0752 5' 5"  (1.651 m)     Head Circumference --      Peak Flow --      Pain Score 03/23/17 0752 10     Pain Loc --      Pain Edu? --      Excl. in [place]? --    Constitutional: Alert and oriented. Well appearing and in no acute distress. Eyes: Conjunctivae are normal.  Head: Atraumatic. Neck: No stridor.   Cardiovascular: Normal rate, regular rhythm. Grossly normal heart sounds.  Good peripheral circulation. Respiratory: Normal respiratory effort.  No retractions. Lungs CTAB. Gastrointestinal: Soft and nontender. No distention.  No CVA tenderness. Musculoskeletal: No lower extremity tenderness nor edema.  No joint effusions. Examination back there is no gross deformity noted however there is some tenderness on palpation of the SI joint and surrounding tissue on the left.  Patient has guarded gait. Range of motion is decreased secondary to discomfort. Good muscle strength bilaterally. Neurologic:  Normal speech and language. No gross focal neurologic deficits are appreciated. reflexes 2+ bilaterally. Skin:  Skin is warm, dry and intact. No rash  noted. Psychiatric: Mood and affect are normal. Speech and behavior are normal.  ____________________________________________   LABS (all labs ordered are listed, but only abnormal results are displayed)  Labs Reviewed  URINALYSIS, COMPLETE (UACMP) WITH MICROSCOPIC - Abnormal; Notable for the following components:      Result Value   Color, Urine  YELLOW (*)    APPearance CLEAR (*)    Squamous Epithelial / LPF 0-5 (*)    All other components within normal limits    PROCEDURES  Procedure(s) performed: None  Procedures  Critical Care performed: No  ____________________________________________   INITIAL IMPRESSION / ASSESSMENT AND PLAN / ED COURSE Urinalysis is reassuring that patient does not have a stone. Patient did get some improvement with the Toradol injection. She'll continue taking her current medications of Zanaflex and oxycodone. Patient is to follow-up with her PCP if any continued problems.  ____________________________________________   FINAL CLINICAL IMPRESSION(S) / ED DIAGNOSES  Final diagnoses:  Sciatica of left side  Chronic left-sided low back pain with left-sided sciatica     ED Discharge Orders    None       Note:  This document was prepared using Dragon voice recognition software and may include unintentional dictation errors.    Johnn Hai, PA-C 03/23/17 1112    Nance Pear, MD 03/23/17 [phone]

## 2017-03-23 NOTE — Discharge Instructions (Signed)
Continue regular medication as prescribed by her doctor including the Zanaflex and oxycodone. Elevate legs to help with back pain. Moist heat or ice to your back as needed for comfort.

## 2017-03-23 NOTE — ED Notes (Signed)
See triage note. Pt c/o pain from bil hips down to bil feet. States she has been taking "all my medicine and it's not helping." Pt's bag of meds contains bottles of oxycodone, gabapentin, and tizanidine, as well as diclofenac gel.

## 2017-03-23 NOTE — ED Triage Notes (Signed)
Patient c/o generalized pan in L side, lower back, and bilateral legs. HX arthritis. No recent injury

## 2017-04-06 DIAGNOSIS — G894 Chronic pain syndrome: Secondary | ICD-10-CM | POA: Insufficient documentation

## 2017-04-08 DIAGNOSIS — E663 Overweight: Secondary | ICD-10-CM | POA: Insufficient documentation

## 2017-05-19 ENCOUNTER — Other Ambulatory Visit: Payer: Self-pay | Admitting: Internal Medicine

## 2017-05-19 ENCOUNTER — Telehealth: Payer: Self-pay

## 2017-05-19 DIAGNOSIS — R0602 Shortness of breath: Secondary | ICD-10-CM

## 2017-05-19 NOTE — Telephone Encounter (Signed)
Patient has been advised that she is due for her next chest xray. She will need to have this done prior to her appointment 05/26/17/[place]

## 2017-05-20 ENCOUNTER — Ambulatory Visit
Admission: RE | Admit: 2017-05-20 | Discharge: 2017-05-20 | Disposition: A | Discharge status: Home / Self Care | Payer: BLUE CROSS/BLUE SHIELD | Source: Ambulatory Visit | Attending: Internal Medicine | Admitting: Internal Medicine

## 2017-05-20 DIAGNOSIS — R0602 Shortness of breath: Secondary | ICD-10-CM | POA: Insufficient documentation

## 2017-05-20 DIAGNOSIS — R918 Other nonspecific abnormal finding of lung field: Secondary | ICD-10-CM | POA: Diagnosis not present

## 2017-05-26 ENCOUNTER — Encounter: Payer: Self-pay | Admitting: Internal Medicine

## 2017-05-26 ENCOUNTER — Ambulatory Visit: Payer: BLUE CROSS/BLUE SHIELD | Admitting: Internal Medicine

## 2017-05-26 VITALS — BP 126/80 | HR 84 | Resp 16 | Ht 65.0 in | Wt 176.0 lb

## 2017-05-26 DIAGNOSIS — R0602 Shortness of breath: Secondary | ICD-10-CM | POA: Diagnosis not present

## 2017-05-26 DIAGNOSIS — J4520 Mild intermittent asthma, uncomplicated: Secondary | ICD-10-CM

## 2017-05-26 NOTE — Patient Instructions (Signed)

## 2017-05-26 NOTE — Progress Notes (Signed)
[facility] [place], [place] [address]  Pulmonary Sleep Medicine  Office Visit Note  Patient Name: Rachael Jordan DOB: [birth year] MRN [id number]  Date of Service: 05/26/2017  Complaints/HPI: Doing better. She had a CXR done showed some LL scar. She states that she on occasion has issues with cough and a little bit of sputum production. No more than usual. She has no hemoptysis noted. Denies having any chest pain noted. She has no admissions to the hospital  ROS  General: (-) fever, (-) chills, (-) night sweats, (-) weakness Skin: (-) rashes, (-) itching,. Eyes: (-) visual changes, (-) redness, (-) itching. Nose and Sinuses: (-) nasal stuffiness or itchiness, (-) postnasal drip, (-) nosebleeds, (-) sinus trouble. Mouth and Throat: (-) sore throat, (-) hoarseness. Neck: (-) swollen glands, (-) enlarged thyroid, (-) neck pain. Respiratory: + cough, (-) bloody sputum, + shortness of breath, - wheezing. Cardiovascular: - ankle swelling, (-) chest pain. Lymphatic: (-) lymph node enlargement. Neurologic: (-) numbness, (-) tingling. Psychiatric: (-) anxiety, (-) depression   Current Medication: Outpatient Encounter Medications as of 05/26/2017  Medication Sig Note  . Acetaminophen 500 MG coapsule Take by mouth. 07/31/2016: PRN  . amitriptyline (ELAVIL) 50 MG tablet Take 50 mg by mouth at bedtime.   Marland Kitchen azelastine (ASTELIN) 0.1 % nasal spray Place into the nose. 07/11/2015: Received from: [place]  . Calcium Carbonate-Vitamin D3 600-400 MG-UNIT TABS Take by mouth.   . cefUROXime (CEFTIN) 500 MG tablet Take 500 mg by mouth 2 (two) times daily with a meal.   . cetirizine (ZYRTEC) 10 MG tablet Take 10 mg by mouth daily. Reported on 08/01/2015   . conjugated estrogens (PREMARIN) vaginal cream Place 1 Applicatorful vaginally daily. Apply 0.34m (pea-sized amount)  just inside the vaginal introitus with a finger-tip every night for two weeks and then  Monday, Wednesday and Friday nights.   . docusate sodium (COLACE) 100 MG capsule Take by mouth. 07/31/2016: PRN  . estradiol (ESTRACE VAGINAL) 0.1 MG/GM vaginal cream Apply 0.545m(pea-sized amount)  just inside the vaginal introitus with a finger-tip every night for two weeks and then Monday, Wednesday and Friday nights.   . gabapentin (NEURONTIN) 600 MG tablet Take 1 tablet (600 mg total) by mouth 3 (three) times daily. (Patient not taking: Reported on 07/31/2016)   . gabapentin (NEURONTIN) 600 MG tablet Take 1 tablet (600 mg total) by mouth 3 (three) times daily.   . Marland KitchenYDROcodone-acetaminophen (NORCO) 10-325 MG tablet Take 1 tablet by mouth every 6 (six) hours as needed.   . Marland KitchenYDROcodone-acetaminophen (NORCO) 7.5-325 MG tablet Limit one tab by mouth per day or 3-5 times per day if tolerated   . HYDROCODONE-CHLORPHENIRAMINE PO Take 5 mLs by mouth every 12 (twelve) hours as needed.   . Marland Kitchenpratropium-albuterol (DUONEB) 0.5-2.5 (3) MG/3ML SOLN Take 3 mLs by nebulization every 4 (four) hours as needed.   . mirabegron ER (MYRBETRIQ) 25 MG TB24 tablet Take 1 tablet (25 mg total) by mouth daily.   . montelukast (SINGULAIR) 10 MG tablet Take 10 mg by mouth daily.   . Multiple Vitamins-Minerals (CENTRUM ADULTS PO) Take by mouth. Reported on 09/28/2015   . OMEPRAZOLE PO Take by mouth.   . oxyCODONE-acetaminophen (ROXICET) 5-325 MG tablet Take 1 tablet by mouth every 6 (six) hours as needed for severe pain. (Patient not taking: Reported on 07/31/2016)   . pantoprazole (PROTONIX) 40 MG tablet Take 40 mg by mouth.   . phentermine 15 MG capsule Take 15  mg by mouth daily. Reported on 08/07/2015   . potassium chloride SA (K-DUR,KLOR-CON) 20 MEQ tablet Take 3 tablets by mouth daily for 7 days   . promethazine (PHENERGAN) 25 MG tablet Take 25 mg by mouth every 6 (six) hours as needed for nausea or vomiting.   . rizatriptan (MAXALT) 10 MG tablet Take 10 mg by mouth as needed for migraine. May repeat in 2 hours if needed   .  tiZANidine (ZANAFLEX) 4 MG tablet Take 4 mg by mouth 3 (three) times daily.   Marland Kitchen topiramate (TOPAMAX) 25 MG tablet Limit 1 - 2 tablets by mouth per day or twice per day if tolerated  NO NEURONTIN (Patient not taking: Reported on 07/31/2016)   . traZODone (DESYREL) 100 MG tablet Take 100 mg by mouth at bedtime.   . vitamin C (ASCORBIC ACID) 500 MG tablet Take 500 mg by mouth daily.    Facility-Administered Encounter Medications as of 05/26/2017  Medication  . ceFAZolin (ANCEF) IVPB 1 g/50 mL premix  . lactated ringers infusion 1,000 mL  . lactated ringers infusion 1,000 mL  . lactated ringers infusion 1,000 mL    Surgical History: Past Surgical History:  Procedure Laterality Date  . ABDOMINAL HYSTERECTOMY     vaginal  . APPENDECTOMY    . FOOT SURGERY Left    10/2015  . NECK SURGERY      Medical History: Past Medical History:  Diagnosis Date  . Acid reflux   . Anxiety   . Arthritis   . Bronchitis 09/2015  . Gross hematuria   . H. pylori infection   . Heart murmur   . Kidney failure   . Lung abnormality    "damaged lung due to pneumonia"  . Neck pain   . Nephrolithiasis   . Pneumonia   . Pneumothorax   . Scoliosis   . Urinary frequency   . Vaginal atrophy     Family History: Family History  Problem Relation Age of Onset  . Arthritis Mother   . Asthma Mother   . Hyperlipidemia Mother   . Hypertension Mother   . Diabetes Sister   . Cancer Maternal Aunt        esophagus  . Breast cancer Neg Hx   . Kidney cancer Neg Hx   . Bladder Cancer Neg Hx     Social History: Social History   Socioeconomic History  . Marital status: Legally Separated    Spouse name: Not on file  . Number of children: Not on file  . Years of education: Not on file  . Highest education level: Not on file  Social Needs  . Financial resource strain: Not on file  . Food insecurity - worry: Not on file  . Food insecurity - inability: Not on file  . Transportation needs - medical: Not on  file  . Transportation needs - non-medical: Not on file  Occupational History  . Not on file  Tobacco Use  . Smoking status: Never Smoker  . Smokeless tobacco: Never Used  Substance and Sexual Activity  . Alcohol use: No    Alcohol/week: 0.0 oz  . Drug use: No  . Sexual activity: Not on file  Other Topics Concern  . Not on file  Social History Narrative  . Not on file    Vital Signs: Blood pressure 126/80, pulse 84, resp. rate 16, height 5' 5"  (1.651 m), weight 176 lb (79.8 kg), SpO2 98 %.  Examination: General Appearance: The patient is well-developed,  well-nourished, and in no distress. Skin: Gross inspection of skin unremarkable. Head: normocephalic, no gross deformities. Eyes: no gross deformities noted. ENT: ears appear grossly normal no exudates. Neck: Supple. No thyromegaly. No LAD. Respiratory: No rhonchi noted. Cardiovascular: Normal S1 and S2 without murmur or rub. Extremities: No cyanosis. pulses are equal. Neurologic: Alert and oriented. No involuntary movements.  LABS: Recent Results (from the past 2160 hour(s))  Urinalysis, Complete w Microscopic     Status: Abnormal   Collection Time: 03/23/17  8:13 AM  Result Value Ref Range   Color, Urine YELLOW (A) YELLOW   APPearance CLEAR (A) CLEAR   Specific Gravity, Urine 1.018 1.005 - 1.030   pH 6.0 5.0 - 8.0   Glucose, UA NEGATIVE NEGATIVE mg/dL   Hgb urine dipstick NEGATIVE NEGATIVE   Bilirubin Urine NEGATIVE NEGATIVE   Ketones, ur NEGATIVE NEGATIVE mg/dL   Protein, ur NEGATIVE NEGATIVE mg/dL   Nitrite NEGATIVE NEGATIVE   Leukocytes, UA NEGATIVE NEGATIVE   RBC / HPF 0-5 0 - 5 RBC/hpf   WBC, UA 0-5 0 - 5 WBC/hpf   Bacteria, UA NONE SEEN NONE SEEN   Squamous Epithelial / LPF 0-5 (A) NONE SEEN    Comment: Performed at [facility], [address], [place], [place] [address]    Radiology: Dg Chest 2 View  Result Date: 05/20/2017 CLINICAL DATA:  Cough. EXAM: CHEST  2 VIEW COMPARISON:  Radiographs  of January 13, 2015. FINDINGS: The heart size and mediastinal contours are within normal limits. No pneumothorax or pleural effusion is noted. Right lung is clear. Mild left basilar subsegmental atelectasis or scarring is noted. The visualized skeletal structures are unremarkable. IMPRESSION: Mild left basilar subsegmental atelectasis or scarring. Electronically Signed   By: Marijo Conception, M.D.   On: 05/20/2017 13:40    No results found.  Dg Chest 2 View  Result Date: 05/20/2017 CLINICAL DATA:  Cough. EXAM: CHEST  2 VIEW COMPARISON:  Radiographs of January 13, 2015. FINDINGS: The heart size and mediastinal contours are within normal limits. No pneumothorax or pleural effusion is noted. Right lung is clear. Mild left basilar subsegmental atelectasis or scarring is noted. The visualized skeletal structures are unremarkable. IMPRESSION: Mild left basilar subsegmental atelectasis or scarring. Electronically Signed   By: Marijo Conception, M.D.   On: 05/20/2017 13:40      Assessment and Plan: Patient Active Problem List   Diagnosis Date Noted  . Overweight (BMI 25.0-29.9) 04/08/2017  . Chronic pain syndrome 04/06/2017  . Sensory disturbance 01/08/2017  . Pain in the groin, right 10/28/2016  . Paresthesia of both hands 12/14/2015  . B12 deficiency 10/31/2015  . SOB (shortness of breath) 08/29/2015  . History of hematuria 07/12/2015  . Urinary frequency 07/12/2015  . Vaginal atrophy 07/12/2015  . HAV (hallux abducto valgus) 07/02/2015  . Overlapping toe 07/02/2015  . Hammertoe 07/02/2015  . LBP (low back pain) 01/17/2015  . Trochanteric bursitis of right hip 01/17/2015  . PNA (pneumonia) 12/28/2014  . DDD (degenerative disc disease), lumbar 08/15/2014  . Lumbar radicular syndrome 08/15/2014  . 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 DDD (degenerative disc disease), cervical 08/15/2014  . Bilateral occipital neuralgia 08/15/2014  . Migraine 08/15/2014  .  Sacroiliac joint dysfunction 08/15/2014  . Back pain, chronic 09/23/2013  . Chronic cervical pain 09/23/2013  . Arthralgia of temporomandibular joint 08/29/2013  . Absolute anemia 08/15/2013  . Chronic kidney disease (CKD), stage III (moderate) ([place]) 08/15/2013  . Abnormal  serum level of alkaline phosphatase 08/15/2013  . Blush 08/15/2013  . Hematuria, microscopic 08/15/2013  . Inflamed nasal mucosa 08/15/2013  . Big thyroid 08/15/2013  . Disease of thyroid gland 08/15/2013  . Positive H. pylori test 08/15/2013  . Gastrointestinal ulcer due to Helicobacter pylori 00/29/8473  . Other specified bacterial intestinal infections 08/14/2013  . Kidney failure 08/09/2013  . SIRS (systemic inflammatory response syndrome) ([place]) 08/09/2013  . Systemic inflammatory response syndrome (SIRS) ([place]) 08/09/2013    1. SOB at baseline likely related to deconditioning and also some underlying asthmatic bronchitis will also check a followu p overnight oximetry on to reassess 2. Chronic Asthma she is using her inhalers as prescribed 3. Obesity again discussed weight loss with her 4. PFT resutls were reviewed  General Counseling: I have discussed the findings of the evaluation and examination with Tristyn.  I have also discussed any further diagnostic evaluation thatmay be needed or ordered today. Dajia verbalizes understanding of the findings of todays visit. We also reviewed her medications today and discussed drug interactions and side effects including but not limited excessive drowsiness and altered mental states. We also discussed that there is always a risk not just to her but also people around her. she has been encouraged to call the office with any questions or concerns that should arise related to todays visit.    Time spent: 38mn  I have personally obtained a history, examined the patient, evaluated laboratory and imaging results, formulated the assessment and plan and placed orders.     SAllyne Gee MD FKin[facility] Specialists PaPulmonary and Critical Care Sleep medicine

## 2017-05-30 ENCOUNTER — Telehealth: Payer: Self-pay | Admitting: Internal Medicine

## 2017-05-30 NOTE — Telephone Encounter (Signed)
Faxed POX order to [facility]/ [place]

## 2017-06-05 ENCOUNTER — Other Ambulatory Visit: Payer: Self-pay

## 2017-06-05 ENCOUNTER — Emergency Department: Payer: BLUE CROSS/BLUE SHIELD

## 2017-06-05 ENCOUNTER — Encounter: Payer: Self-pay | Admitting: Emergency Medicine

## 2017-06-05 ENCOUNTER — Inpatient Hospital Stay
Admission: EM | Admit: 2017-06-05 | Discharge: 2017-06-08 | DRG: 552 | Disposition: A | Discharge status: Home Health | Payer: BLUE CROSS/BLUE SHIELD | Attending: Specialist | Admitting: Specialist

## 2017-06-05 DIAGNOSIS — K219 Gastro-esophageal reflux disease without esophagitis: Secondary | ICD-10-CM | POA: Diagnosis present

## 2017-06-05 DIAGNOSIS — F329 Major depressive disorder, single episode, unspecified: Secondary | ICD-10-CM | POA: Diagnosis present

## 2017-06-05 DIAGNOSIS — G43109 Migraine with aura, not intractable, without status migrainosus: Secondary | ICD-10-CM

## 2017-06-05 DIAGNOSIS — Z8261 Family history of arthritis: Secondary | ICD-10-CM | POA: Diagnosis not present

## 2017-06-05 DIAGNOSIS — R3911 Hesitancy of micturition: Secondary | ICD-10-CM | POA: Diagnosis present

## 2017-06-05 DIAGNOSIS — M5416 Radiculopathy, lumbar region: Secondary | ICD-10-CM

## 2017-06-05 DIAGNOSIS — Z87442 Personal history of urinary calculi: Secondary | ICD-10-CM | POA: Diagnosis not present

## 2017-06-05 DIAGNOSIS — G629 Polyneuropathy, unspecified: Secondary | ICD-10-CM | POA: Diagnosis present

## 2017-06-05 DIAGNOSIS — Z79899 Other long term (current) drug therapy: Secondary | ICD-10-CM

## 2017-06-05 DIAGNOSIS — M5030 Other cervical disc degeneration, unspecified cervical region: Secondary | ICD-10-CM | POA: Diagnosis present

## 2017-06-05 DIAGNOSIS — S20219A Contusion of unspecified front wall of thorax, initial encounter: Secondary | ICD-10-CM | POA: Diagnosis present

## 2017-06-05 DIAGNOSIS — M5136 Other intervertebral disc degeneration, lumbar region: Secondary | ICD-10-CM

## 2017-06-05 DIAGNOSIS — M1990 Unspecified osteoarthritis, unspecified site: Secondary | ICD-10-CM | POA: Diagnosis present

## 2017-06-05 DIAGNOSIS — M5481 Occipital neuralgia: Secondary | ICD-10-CM

## 2017-06-05 DIAGNOSIS — M549 Dorsalgia, unspecified: Secondary | ICD-10-CM | POA: Diagnosis present

## 2017-06-05 DIAGNOSIS — M419 Scoliosis, unspecified: Secondary | ICD-10-CM | POA: Diagnosis present

## 2017-06-05 DIAGNOSIS — S32029A Unspecified fracture of second lumbar vertebra, initial encounter for closed fracture: Secondary | ICD-10-CM | POA: Diagnosis present

## 2017-06-05 DIAGNOSIS — Z79891 Long term (current) use of opiate analgesic: Secondary | ICD-10-CM

## 2017-06-05 DIAGNOSIS — S22000A Wedge compression fracture of unspecified thoracic vertebra, initial encounter for closed fracture: Secondary | ICD-10-CM

## 2017-06-05 DIAGNOSIS — Z90710 Acquired absence of both cervix and uterus: Secondary | ICD-10-CM | POA: Diagnosis not present

## 2017-06-05 DIAGNOSIS — K5900 Constipation, unspecified: Secondary | ICD-10-CM | POA: Diagnosis present

## 2017-06-05 DIAGNOSIS — M5117 Intervertebral disc disorders with radiculopathy, lumbosacral region: Secondary | ICD-10-CM | POA: Diagnosis present

## 2017-06-05 DIAGNOSIS — Y92410 Unspecified street and highway as the place of occurrence of the external cause: Secondary | ICD-10-CM

## 2017-06-05 DIAGNOSIS — S301XXA Contusion of abdominal wall, initial encounter: Secondary | ICD-10-CM | POA: Diagnosis present

## 2017-06-05 DIAGNOSIS — Z91018 Allergy to other foods: Secondary | ICD-10-CM

## 2017-06-05 DIAGNOSIS — S32019A Unspecified fracture of first lumbar vertebra, initial encounter for closed fracture: Secondary | ICD-10-CM | POA: Diagnosis not present

## 2017-06-05 DIAGNOSIS — G8929 Other chronic pain: Secondary | ICD-10-CM | POA: Diagnosis present

## 2017-06-05 DIAGNOSIS — S22089A Unspecified fracture of T11-T12 vertebra, initial encounter for closed fracture: Secondary | ICD-10-CM | POA: Diagnosis present

## 2017-06-05 DIAGNOSIS — M545 Low back pain, unspecified: Secondary | ICD-10-CM

## 2017-06-05 DIAGNOSIS — M48062 Spinal stenosis, lumbar region with neurogenic claudication: Secondary | ICD-10-CM

## 2017-06-05 DIAGNOSIS — G43101 Migraine with aura, not intractable, with status migrainosus: Secondary | ICD-10-CM

## 2017-06-05 DIAGNOSIS — S32009A Unspecified fracture of unspecified lumbar vertebra, initial encounter for closed fracture: Secondary | ICD-10-CM

## 2017-06-05 DIAGNOSIS — Z79890 Hormone replacement therapy (postmenopausal): Secondary | ICD-10-CM | POA: Diagnosis not present

## 2017-06-05 DIAGNOSIS — M5116 Intervertebral disc disorders with radiculopathy, lumbar region: Secondary | ICD-10-CM | POA: Diagnosis present

## 2017-06-05 DIAGNOSIS — Z888 Allergy status to other drugs, medicaments and biological substances status: Secondary | ICD-10-CM | POA: Diagnosis not present

## 2017-06-05 DIAGNOSIS — M533 Sacrococcygeal disorders, not elsewhere classified: Secondary | ICD-10-CM

## 2017-06-05 LAB — CBC
HCT: 38.2 % (ref 35.0–47.0)
HEMOGLOBIN: 12.2 g/dL (ref 12.0–16.0)
MCH: 29.4 pg (ref 26.0–34.0)
MCHC: 32 g/dL (ref 32.0–36.0)
MCV: 91.9 fL (ref 80.0–100.0)
Platelets: 352 10*3/uL (ref 150–440)
RBC: 4.15 MIL/uL (ref 3.80–5.20)
RDW: 14.5 % (ref 11.5–14.5)
WBC: 12.8 10*3/uL — ABNORMAL HIGH (ref 3.6–11.0)

## 2017-06-05 LAB — BASIC METABOLIC PANEL
ANION GAP: 9 (ref 5–15)
BUN: 16 mg/dL (ref 6–20)
CO2: 28 mmol/L (ref 22–32)
Calcium: 10.5 mg/dL — ABNORMAL HIGH (ref 8.9–10.3)
Chloride: 102 mmol/L (ref 101–111)
Creatinine, Ser: 0.92 mg/dL (ref 0.44–1.00)
GFR calc Af Amer: >60 mL/min (ref >60)
GLUCOSE: 115 mg/dL — AB (ref 65–99)
Potassium: 3.6 mmol/L (ref 3.5–5.1)
SODIUM: 139 mmol/L (ref 135–145)

## 2017-06-05 LAB — TROPONIN I

## 2017-06-05 MED ORDER — SENNA 8.6 MG PO TABS
1.0000 | ORAL_TABLET | Freq: Two times a day (BID) | ORAL | Status: DC
  Administered 2017-06-05 – 2017-06-08 (×6): 8.6 mg via ORAL
  Filled 2017-06-05 (×6): qty 1

## 2017-06-05 MED ORDER — ESTROGENS, CONJUGATED 0.625 MG/GM VA CREA
1.0000 | TOPICAL_CREAM | Freq: Every day | VAGINAL | Status: DC
  Administered 2017-06-05 – 2017-06-07 (×3): 1 via VAGINAL
  Filled 2017-06-05: qty 30

## 2017-06-05 MED ORDER — OXYCODONE-ACETAMINOPHEN 5-325 MG PO TABS
1.0000 | ORAL_TABLET | Freq: Once | ORAL | Status: AC
  Administered 2017-06-05: 1 via ORAL
  Filled 2017-06-05: qty 1

## 2017-06-05 MED ORDER — AMITRIPTYLINE HCL 25 MG PO TABS
50.0000 mg | ORAL_TABLET | Freq: Every day | ORAL | Status: DC
  Administered 2017-06-05 – 2017-06-07 (×3): 50 mg via ORAL
  Filled 2017-06-05 (×3): qty 2

## 2017-06-05 MED ORDER — HYDROCODONE-ACETAMINOPHEN 10-325 MG PO TABS
1.0000 | ORAL_TABLET | Freq: Four times a day (QID) | ORAL | Status: DC | PRN
  Administered 2017-06-05 – 2017-06-07 (×4): 1 via ORAL
  Filled 2017-06-05 (×5): qty 1

## 2017-06-05 MED ORDER — RIZATRIPTAN BENZOATE 10 MG PO TABS
10.0000 mg | ORAL_TABLET | Freq: Four times a day (QID) | ORAL | Status: AC | PRN
  Administered 2017-06-05: 10 mg via ORAL
  Filled 2017-06-05: qty 1

## 2017-06-05 MED ORDER — GABAPENTIN 300 MG PO CAPS
600.0000 mg | ORAL_CAPSULE | Freq: Three times a day (TID) | ORAL | Status: DC
  Administered 2017-06-05 – 2017-06-08 (×10): 600 mg via ORAL
  Filled 2017-06-05 (×10): qty 2

## 2017-06-05 MED ORDER — POLYETHYLENE GLYCOL 3350 17 G PO PACK: 17.0000 g | PACK | Freq: Every day | ORAL | Status: DC | PRN

## 2017-06-05 MED ORDER — ONDANSETRON HCL 4 MG PO TABS: 4.0000 mg | ORAL_TABLET | Freq: Four times a day (QID) | ORAL | Status: DC | PRN

## 2017-06-05 MED ORDER — DOCUSATE SODIUM 100 MG PO CAPS
100.0000 mg | ORAL_CAPSULE | Freq: Two times a day (BID) | ORAL | Status: DC
  Administered 2017-06-05 – 2017-06-06 (×2): 100 mg via ORAL
  Filled 2017-06-05 (×2): qty 1

## 2017-06-05 MED ORDER — PANTOPRAZOLE SODIUM 40 MG PO TBEC
40.0000 mg | DELAYED_RELEASE_TABLET | Freq: Every day | ORAL | Status: DC
  Administered 2017-06-05 – 2017-06-08 (×4): 40 mg via ORAL
  Filled 2017-06-05 (×4): qty 1

## 2017-06-05 MED ORDER — PANTOPRAZOLE SODIUM 40 MG PO TBEC: 40.0000 mg | DELAYED_RELEASE_TABLET | Freq: Every day | ORAL | Status: DC

## 2017-06-05 MED ORDER — HYDROMORPHONE HCL 1 MG/ML IJ SOLN
1.0000 mg | INTRAMUSCULAR | Status: DC | PRN
  Administered 2017-06-05 – 2017-06-07 (×6): 1 mg via INTRAVENOUS
  Filled 2017-06-05 (×7): qty 1

## 2017-06-05 MED ORDER — PHENTERMINE HCL 15 MG PO CAPS: 15.0000 mg | ORAL_CAPSULE | Freq: Every day | ORAL | Status: DC

## 2017-06-05 MED ORDER — SODIUM CHLORIDE 0.9 % IV SOLN
INTRAVENOUS | Status: DC
  Administered 2017-06-05 – 2017-06-06 (×2): via INTRAVENOUS
  Administered 2017-06-07: 75 mL/h via INTRAVENOUS

## 2017-06-05 MED ORDER — TIZANIDINE HCL 4 MG PO TABS
4.0000 mg | ORAL_TABLET | Freq: Three times a day (TID) | ORAL | Status: DC
  Administered 2017-06-05 – 2017-06-08 (×8): 4 mg via ORAL
  Filled 2017-06-05 (×13): qty 1

## 2017-06-05 MED ORDER — ENOXAPARIN SODIUM 40 MG/0.4ML ~~LOC~~ SOLN
40.0000 mg | SUBCUTANEOUS | Status: DC
  Administered 2017-06-05 – 2017-06-07 (×3): 40 mg via SUBCUTANEOUS
  Filled 2017-06-05 (×3): qty 0.4

## 2017-06-05 MED ORDER — ACETAMINOPHEN 325 MG PO TABS: 650.0000 mg | ORAL_TABLET | Freq: Four times a day (QID) | ORAL | Status: DC | PRN

## 2017-06-05 MED ORDER — TRAZODONE HCL 100 MG PO TABS
100.0000 mg | ORAL_TABLET | Freq: Every day | ORAL | Status: DC
  Administered 2017-06-05 – 2017-06-07 (×3): 100 mg via ORAL
  Filled 2017-06-05 (×3): qty 1

## 2017-06-05 MED ORDER — ONDANSETRON HCL 4 MG/2ML IJ SOLN: 4.0000 mg | Freq: Four times a day (QID) | INTRAMUSCULAR | Status: DC | PRN

## 2017-06-05 MED ORDER — HYDROMORPHONE HCL 1 MG/ML IJ SOLN
0.5000 mg | Freq: Once | INTRAMUSCULAR | Status: AC
  Administered 2017-06-05: 0.5 mg via INTRAVENOUS
  Filled 2017-06-05: qty 1

## 2017-06-05 MED ORDER — ACETAMINOPHEN 650 MG RE SUPP: 650.0000 mg | Freq: Four times a day (QID) | RECTAL | Status: DC | PRN

## 2017-06-05 MED ORDER — NON FORMULARY: 10.0000 mg | Freq: Four times a day (QID) | Status: DC | PRN

## 2017-06-05 MED ORDER — LACTATED RINGERS IV SOLN: 1000.0000 mL | INTRAVENOUS | Status: DC

## 2017-06-05 MED ORDER — IOPAMIDOL (ISOVUE-300) INJECTION 61%
100.0000 mL | Freq: Once | INTRAVENOUS | Status: AC | PRN
  Administered 2017-06-05: 100 mL via INTRAVENOUS
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
## 2017-06-05 NOTE — ED Notes (Signed)
[facility] at bedside with brace.

## 2017-06-05 NOTE — ED Notes (Signed)
This RN notified [employer] whom states they will have a TLSO brace here as soon as possible. RN will continue to monitor.

## 2017-06-05 NOTE — ED Notes (Signed)
Transport to floor rom 157.AS
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
## 2017-06-05 NOTE — Progress Notes (Signed)
Pt in room 157. Received pain medication. Admission complete. VSS. No adventitious heart or lung sounds. Pulses intact in all extremities and can move all extremities. Will monitor.

## 2017-06-06 ENCOUNTER — Inpatient Hospital Stay: Payer: BLUE CROSS/BLUE SHIELD

## 2017-06-06 LAB — BASIC METABOLIC PANEL
ANION GAP: 8 (ref 5–15)
BUN: 12 mg/dL (ref 6–20)
CHLORIDE: 107 mmol/L (ref 101–111)
CO2: 26 mmol/L (ref 22–32)
Calcium: 8.5 mg/dL — ABNORMAL LOW (ref 8.9–10.3)
Creatinine, Ser: 0.9 mg/dL (ref 0.44–1.00)
GFR calc non Af Amer: >60 mL/min (ref >60)
Glucose, Bld: 101 mg/dL — ABNORMAL HIGH (ref 65–99)
POTASSIUM: 3.6 mmol/L (ref 3.5–5.1)
SODIUM: 141 mmol/L (ref 135–145)

## 2017-06-06 LAB — CBC
HEMATOCRIT: 32.1 % — AB (ref 35.0–47.0)
HEMOGLOBIN: 10.4 g/dL — AB (ref 12.0–16.0)
MCH: 29.7 pg (ref 26.0–34.0)
MCHC: 32.5 g/dL (ref 32.0–36.0)
MCV: 91.5 fL (ref 80.0–100.0)
Platelets: 291 10*3/uL (ref 150–440)
RBC: 3.51 MIL/uL — AB (ref 3.80–5.20)
RDW: 14.8 % — ABNORMAL HIGH (ref 11.5–14.5)
WBC: 6.8 10*3/uL (ref 3.6–11.0)

## 2017-06-06 MED ORDER — DOCUSATE SODIUM 100 MG PO CAPS
200.0000 mg | ORAL_CAPSULE | Freq: Two times a day (BID) | ORAL | Status: DC
  Administered 2017-06-06 – 2017-06-08 (×4): 200 mg via ORAL
  Filled 2017-06-06 (×4): qty 2

## 2017-06-06 MED ORDER — DOCUSATE SODIUM 100 MG PO CAPS
100.0000 mg | ORAL_CAPSULE | Freq: Once | ORAL | Status: AC
Start: 2017-06-06 — End: 2017-06-06
  Administered 2017-06-06: 100 mg via ORAL
  Filled 2017-06-06: qty 1

## 2017-06-06 MED ORDER — SODIUM CHLORIDE 0.9 % IV BOLUS (SEPSIS)
500.0000 mL | Freq: Once | INTRAVENOUS | Status: AC
  Administered 2017-06-06: 500 mL via INTRAVENOUS
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
## 2017-06-06 NOTE — Progress Notes (Signed)
Pt's BP=81/44 and 86/43 when rechecked. Pt is comfortable and no complaints. Dr. Marcille Blanco paged and ordered for NS 579m bolus. Will administer and continue to monitor.

## 2017-06-07 LAB — HIV ANTIBODY (ROUTINE TESTING W REFLEX): HIV Screen 4th Generation wRfx: NONREACTIVE

## 2017-06-07 MED ORDER — CYCLOBENZAPRINE HCL 10 MG PO TABS
5.0000 mg | ORAL_TABLET | Freq: Three times a day (TID) | ORAL | Status: DC | PRN
  Administered 2017-06-07 (×2): 5 mg via ORAL
  Filled 2017-06-07 (×2): qty 1

## 2017-06-07 MED ORDER — OXYCODONE HCL 5 MG PO TABS
10.0000 mg | ORAL_TABLET | ORAL | Status: DC | PRN
  Administered 2017-06-07 – 2017-06-08 (×7): 10 mg via ORAL
  Filled 2017-06-07 (×7): qty 2

## 2017-06-07 MED ORDER — SUMATRIPTAN SUCCINATE 50 MG PO TABS
50.0000 mg | ORAL_TABLET | ORAL | Status: DC | PRN
  Administered 2017-06-07 – 2017-06-08 (×2): 50 mg via ORAL
  Filled 2017-06-07 (×3): qty 1

## 2017-06-07 MED ORDER — PHENOL 1.4 % MT LIQD
1.0000 | OROMUCOSAL | Status: DC | PRN
Start: 2017-06-07 — End: 2017-06-08
  Filled 2017-06-07: qty 177
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
## 2017-06-08 MED ORDER — TROLAMINE SALICYLATE 10 % EX CREA
TOPICAL_CREAM | CUTANEOUS | Status: DC | PRN
  Filled 2017-06-08: qty 85
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
## 2017-06-08 NOTE — Care Management (Signed)
Spoke with Ms. Bacallao at the bedside. Discussed home health agencies. [place]. Will update Jermaine, [facility] representative. Discharge to home today per Dr. Tor Netters. Shelbie Ammons RN MSN CCM Care Management [phone]

## 2017-06-08 NOTE — Progress Notes (Signed)
Patient is being discharged to home with [facility] [place] instructions given and patient acknowledged understanding. Patient and family had no questions. IV and belongings packed.  Family here to take patient home.
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
## 2017-06-08 NOTE — Clinical Social Work Note (Signed)
PT is now recommending home with home health. CSW is signing off. Please consult should additional needs arise.  Santiago Bumpers, MSW, Latanya Presser [phone]

## 2017-06-09 ENCOUNTER — Telehealth: Payer: Self-pay | Admitting: Internal Medicine

## 2017-06-09 NOTE — Telephone Encounter (Signed)
FAXED PAPERWORK FOR OVERNIGHT OXIMETRY TO [email or web address]

## 2017-06-12 LAB — URINE DRUGS OF ABUSE SCREEN W ALC, ROUTINE (REF LAB)
Amphetamines, Urine: NEGATIVE ng/mL
BARBITURATE, UR: NEGATIVE ng/mL
CANNABINOID QUANT UR: NEGATIVE ng/mL
COCAINE (METAB.): NEGATIVE ng/mL
ETHANOL U, QUAN: NEGATIVE %
Methadone Screen, Urine: NEGATIVE ng/mL
Phencyclidine, Ur: NEGATIVE ng/mL
Propoxyphene, Urine: NEGATIVE ng/mL

## 2017-06-12 LAB — OPIATES CONFIRMATION, URINE: OPIATES: NEGATIVE

## 2017-06-12 LAB — DRUG PROFILE 799031: BENZODIAZEPINES: NEGATIVE

## 2017-06-13 ENCOUNTER — Telehealth: Payer: Self-pay

## 2017-06-13 NOTE — Telephone Encounter (Signed)
EMMI Follow-up: It was noted on the report that the patient had questions regarding discharge papers and who to call for changes in condition.  I left a VM and will try to call again.

## 2017-06-24 ENCOUNTER — Ambulatory Visit
Admission: RE | Admit: 2017-06-24 | Discharge: 2017-06-24 | Disposition: A | Discharge status: Home / Self Care | Payer: BLUE CROSS/BLUE SHIELD | Source: Ambulatory Visit | Attending: Internal Medicine | Admitting: Internal Medicine

## 2017-06-24 ENCOUNTER — Encounter: Payer: Self-pay | Admitting: Internal Medicine

## 2017-06-24 ENCOUNTER — Ambulatory Visit: Payer: BLUE CROSS/BLUE SHIELD | Admitting: Internal Medicine

## 2017-06-24 VITALS — BP 113/66 | HR 87 | Resp 16 | Ht 65.0 in | Wt 178.0 lb

## 2017-06-24 DIAGNOSIS — R0602 Shortness of breath: Secondary | ICD-10-CM

## 2017-06-24 DIAGNOSIS — G4734 Idiopathic sleep related nonobstructive alveolar hypoventilation: Secondary | ICD-10-CM

## 2017-06-24 DIAGNOSIS — J984 Other disorders of lung: Secondary | ICD-10-CM | POA: Diagnosis not present

## 2017-06-24 DIAGNOSIS — J449 Chronic obstructive pulmonary disease, unspecified: Secondary | ICD-10-CM
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
## 2017-06-24 NOTE — Addendum Note (Signed)
Addended by: Edd Arbour on: 06/24/2017 03:00 PM   Modules accepted: Orders

## 2017-06-24 NOTE — Patient Instructions (Signed)
Echocardiogram An echocardiogram, or echocardiography, uses sound waves (ultrasound) to produce an image of your heart. The echocardiogram is simple, painless, obtained within a short period of time, and offers valuable information to your health care provider. The images from an echocardiogram can provide information such as:  Evidence of coronary artery disease (CAD).  Heart size.  Heart muscle function.  Heart valve function.  Aneurysm detection.  Evidence of a past heart attack.  Fluid buildup around the heart.  Heart muscle thickening.  Assess heart valve function.  Tell a health care provider about:  Any allergies you have.  All medicines you are taking, including vitamins, herbs, eye drops, creams, and over-the-counter medicines.  Any problems you or family members have had with anesthetic medicines.  Any blood disorders you have.  Any surgeries you have had.  Any medical conditions you have.  Whether you are pregnant or may be pregnant. What happens before the procedure? No special preparation is needed. Eat and drink normally. What happens during the procedure?  In order to produce an image of your heart, gel will be applied to your chest and a wand-like tool (transducer) will be moved over your chest. The gel will help transmit the sound waves from the transducer. The sound waves will harmlessly bounce off your heart to allow the heart images to be captured in real-time motion. These images will then be recorded.  You may need an IV to receive a medicine that improves the quality of the pictures. What happens after the procedure? You may return to your normal schedule including diet, activities, and medicines, unless your health care provider tells you otherwise. This information is not intended to replace advice given to you by your health care provider. Make sure you discuss any questions you have with your health care provider. Document Released: 03/01/2000  Document Revised: 10/21/2015 Document Reviewed: 11/09/2012 Elsevier Interactive Patient Education  2017 [employer].

## 2017-06-25 ENCOUNTER — Telehealth: Payer: Self-pay | Admitting: Internal Medicine

## 2017-06-25 NOTE — Telephone Encounter (Signed)
Faxed [facility] order for POX overnight oximetry ON O2. Beth

## 2017-07-12 DIAGNOSIS — J410 Simple chronic bronchitis: Secondary | ICD-10-CM | POA: Insufficient documentation

## 2017-07-12 DIAGNOSIS — S22000S Wedge compression fracture of unspecified thoracic vertebra, sequela: Secondary | ICD-10-CM | POA: Insufficient documentation

## 2017-07-12 DIAGNOSIS — G4734 Idiopathic sleep related nonobstructive alveolar hypoventilation: Secondary | ICD-10-CM | POA: Insufficient documentation

## 2017-07-12 DIAGNOSIS — S32000S Wedge compression fracture of unspecified lumbar vertebra, sequela: Secondary | ICD-10-CM | POA: Insufficient documentation

## 2017-07-17 ENCOUNTER — Ambulatory Visit: Payer: Self-pay | Admitting: Internal Medicine

## 2017-07-17 ENCOUNTER — Telehealth: Payer: Self-pay | Admitting: Internal Medicine

## 2017-07-17 NOTE — Telephone Encounter (Signed)
Per Dr Devona Konig after reviewing her overnight oximetry results we will increase her O2 nightly by 1 liter and patient has been advised. Rachael Jordan

## 2017-07-18 ENCOUNTER — Ambulatory Visit: Payer: BLUE CROSS/BLUE SHIELD

## 2017-07-18 DIAGNOSIS — R0602 Shortness of breath: Secondary | ICD-10-CM

## 2017-07-28 ENCOUNTER — Encounter: Payer: Self-pay | Admitting: Internal Medicine

## 2017-07-28 ENCOUNTER — Ambulatory Visit: Payer: BLUE CROSS/BLUE SHIELD | Admitting: Internal Medicine

## 2017-07-28 VITALS — BP 148/70 | HR 91 | Resp 16 | Ht 65.0 in | Wt 179.8 lb

## 2017-07-28 DIAGNOSIS — Z9981 Dependence on supplemental oxygen: Secondary | ICD-10-CM

## 2017-07-28 DIAGNOSIS — J181 Lobar pneumonia, unspecified organism: Secondary | ICD-10-CM

## 2017-07-28 DIAGNOSIS — R0602 Shortness of breath: Secondary | ICD-10-CM | POA: Diagnosis not present

## 2017-07-28 DIAGNOSIS — J449 Chronic obstructive pulmonary disease, unspecified: Secondary | ICD-10-CM

## 2017-07-28 NOTE — Progress Notes (Signed)
[facility] [place], [place] [address]  Pulmonary Sleep Medicine   Office Visit Note  Patient Name: Rachael Jordan DOB: 24-May-1955 MRN [id number]  Date of Service: 07/28/2017  Complaints/HPI:  She is doing fairly well she has had no issues since her last visit.  The echocardiogram reveals normal LV function normal LV size and normal RV function and normal RV size.  She has actually done very well as far as her breathing is concerned.  She also had concern for diagnosis of COPD which was confirmed by her pulmonary functions that were done in September of 2018 where she had mild reduction and mild COPD.  She is using her inhalers in these do seem to help her significantly.  She also is on oxygen at nighttime and we increased that to 3 L flow based on her overnight oximetry that was done  ROS  General: (-) fever, (-) chills, (-) night sweats, (-) weakness Skin: (-) rashes, (-) itching,. Eyes: (-) visual changes, (-) redness, (-) itching. Nose and Sinuses: (-) nasal stuffiness or itchiness, (-) postnasal drip, (-) nosebleeds, (-) sinus trouble. Mouth and Throat: (-) sore throat, (-) hoarseness. Neck: (-) swollen glands, (-) enlarged thyroid, (-) neck pain. Respiratory: - cough, (-) bloody sputum, + shortness of breath, - wheezing. Cardiovascular: - ankle swelling, (-) chest pain. Lymphatic: (-) lymph node enlargement. Neurologic: (-) numbness, (-) tingling. Psychiatric: (-) anxiety, (-) depression   Current Medication: Outpatient Encounter Medications as of 07/28/2017  Medication Sig Note  . azelastine (ASTELIN) 0.1 % nasal spray Place 1 spray into the nose 2 (two) times daily.    . Biotin 10000 MCG TABS Take 1 tablet by mouth daily.   . Calcium Carbonate-Vitamin D3 600-400 MG-UNIT TABS Take 1 tablet by mouth daily.  06/05/2017: Needs to buy more.  . celecoxib (CELEBREX) 200 MG capsule Take 200 mg by mouth 2 (two) times daily.   . cetirizine (ZYRTEC) 10 MG  tablet Take 10 mg by mouth at bedtime. Reported on 08/01/2015   . diclofenac sodium (VOLTAREN) 1 % GEL Apply 2-4 g topically 4 (four) times daily.   Marland Kitchen docusate sodium (COLACE) 100 MG capsule Take 100 mg by mouth daily as needed for mild constipation.  07/31/2016: PRN  . gabapentin (NEURONTIN) 600 MG tablet Take 1 tablet (600 mg total) by mouth 3 (three) times daily. (Patient taking differently: Take 800 mg by mouth 3 (three) times daily. ) 06/05/2017: May take additional at bedtime if needed.  Marland Kitchen ipratropium-albuterol (DUONEB) 0.5-2.5 (3) MG/3ML SOLN Take 3 mLs by nebulization every 4 (four) hours as needed.   . montelukast (SINGULAIR) 10 MG tablet Take 10 mg by mouth daily.   . Multiple Vitamins-Minerals (CENTRUM ADULTS PO) Take by mouth. Reported on 09/28/2015   . Oxycodone HCl 10 MG TABS Take 5-10 mg by mouth See admin instructions. Take 5-10 mg 3-5 times daily if tolerated   . phentermine 15 MG capsule Take 15 mg by mouth daily. Reported on 08/07/2015   . promethazine (PHENERGAN) 25 MG tablet Take 25 mg by mouth every 6 (six) hours as needed for nausea or vomiting.   . rizatriptan (MAXALT) 10 MG tablet Take 10 mg by mouth as needed for migraine. May repeat in 2 hours if needed   . tiZANidine (ZANAFLEX) 4 MG tablet Take 4 mg by mouth 4 (four) times daily.    . traZODone (DESYREL) 100 MG tablet Take 100 mg by mouth at bedtime.   . vitamin C (ASCORBIC  ACID) 500 MG tablet Take 500 mg by mouth daily.    No facility-administered encounter medications on file as of 07/28/2017.     Surgical History: Past Surgical History:  Procedure Laterality Date  . ABDOMINAL HYSTERECTOMY     vaginal  . APPENDECTOMY    . FOOT SURGERY Left    10/2015  . NECK SURGERY      Medical History: Past Medical History:  Diagnosis Date  . Acid reflux   . Anxiety   . Arthritis   . Bronchitis 09/2015  . Gross hematuria   . H. pylori infection   . Heart murmur   . Kidney failure   . Lung abnormality    "damaged lung  due to pneumonia"  . Neck pain   . Nephrolithiasis   . Pneumonia   . Pneumothorax   . Scoliosis   . Urinary frequency   . Vaginal atrophy     Family History: Family History  Problem Relation Age of Onset  . Arthritis Mother   . Asthma Mother   . Hyperlipidemia Mother   . Hypertension Mother   . Diabetes Sister   . Cancer Maternal Aunt        esophagus  . Breast cancer Neg Hx   . Kidney cancer Neg Hx   . Bladder Cancer Neg Hx     Social History: Social History   Socioeconomic History  . Marital status: Legally Separated    Spouse name: Not on file  . Number of children: Not on file  . Years of education: Not on file  . Highest education level: Not on file  Occupational History  . Not on file  Social Needs  . Financial resource strain: Not on file  . Food insecurity:    Worry: Not on file    Inability: Not on file  . Transportation needs:    Medical: Not on file    Non-medical: Not on file  Tobacco Use  . Smoking status: Never Smoker  . Smokeless tobacco: Never Used  Substance and Sexual Activity  . Alcohol use: No    Alcohol/week: 0.0 oz  . Drug use: No  . Sexual activity: Not on file  Lifestyle  . Physical activity:    Days per week: Not on file    Minutes per session: Not on file  . Stress: Not on file  Relationships  . Social connections:    Talks on phone: Not on file    Gets together: Not on file    Attends religious service: Not on file    Active member of club or organization: Not on file    Attends meetings of clubs or organizations: Not on file    Relationship status: Not on file  . Intimate partner violence:    Fear of current or ex partner: Not on file    Emotionally abused: Not on file    Physically abused: Not on file    Forced sexual activity: Not on file  Other Topics Concern  . Not on file  Social History Narrative  . Not on file    Vital Signs: Blood pressure (!) 148/70, pulse 91, resp. rate 16, height 5' 5"  (1.651 m),  weight 179 lb 12.8 oz (81.6 kg), SpO2 99 %.  Examination: General Appearance: The patient is well-developed, well-nourished, and in no distress. Skin: Gross inspection of skin unremarkable. Head: normocephalic, no gross deformities. Eyes: no gross deformities noted. ENT: ears appear grossly normal no exudates. Neck: Supple. No thyromegaly.  No LAD. Respiratory: no rhonchi. Cardiovascular: Normal S1 and S2 without murmur or rub. Extremities: No cyanosis. pulses are equal. Neurologic: Alert and oriented. No involuntary movements.  LABS: Recent Results (from the past 2160 hour(s))  Basic metabolic panel     Status: Abnormal   Collection Time: 06/05/17 10:00 AM  Result Value Ref Range   Sodium 139 135 - 145 mmol/L   Potassium 3.6 3.5 - 5.1 mmol/L   Chloride 102 101 - 111 mmol/L   CO2 28 22 - 32 mmol/L   Glucose, Bld 115 (H) 65 - 99 mg/dL   BUN 16 6 - 20 mg/dL   Creatinine, Ser 0.92 0.44 - 1.00 mg/dL   Calcium 10.5 (H) 8.9 - 10.3 mg/dL   GFR calc non Af Amer >60 >60 mL/min   GFR calc Af Amer >60 >60 mL/min    Comment: (NOTE) The eGFR has been calculated using the CKD EPI equation. This calculation has not been validated in all clinical situations. eGFR's persistently <60 mL/min signify possible Chronic Kidney Disease.    Anion gap 9 5 - 15    Comment: Performed at [facility], [address] [place], [place] [address]  CBC     Status: Abnormal   Collection Time: 06/05/17 10:00 AM  Result Value Ref Range   WBC 12.8 (H) 3.6 - 11.0 K/uL   RBC 4.15 3.80 - 5.20 MIL/uL   Hemoglobin 12.2 12.0 - 16.0 g/dL   HCT 38.2 35.0 - 47.0 %   MCV 91.9 80.0 - 100.0 fL   MCH 29.4 26.0 - 34.0 pg   MCHC 32.0 32.0 - 36.0 g/dL   RDW 14.5 11.5 - 14.5 %   Platelets 352 150 - 440 K/uL    Comment: Performed at [facility], [address] [place], [place] [address]  Troponin I     Status: None   Collection Time: 06/05/17 10:00 AM  Result Value Ref Range   Troponin I <0.03  <0.03 ng/mL    Comment: Performed at [facility], [address] [place], [place] [address]  Basic metabolic panel     Status: Abnormal   Collection Time: 06/06/17  5:25 AM  Result Value Ref Range   Sodium 141 135 - 145 mmol/L   Potassium 3.6 3.5 - 5.1 mmol/L   Chloride 107 101 - 111 mmol/L   CO2 26 22 - 32 mmol/L   Glucose, Bld 101 (H) 65 - 99 mg/dL   BUN 12 6 - 20 mg/dL   Creatinine, Ser 0.90 0.44 - 1.00 mg/dL   Calcium 8.5 (L) 8.9 - 10.3 mg/dL   GFR calc non Af Amer >60 >60 mL/min   GFR calc Af Amer >60 >60 mL/min    Comment: (NOTE) The eGFR has been calculated using the CKD EPI equation. This calculation has not been validated in all clinical situations. eGFR's persistently <60 mL/min signify possible Chronic Kidney Disease.    Anion gap 8 5 - 15    Comment: Performed at [facility], [address] [place], [place] [address]  CBC     Status: Abnormal   Collection Time: 06/06/17  5:25 AM  Result Value Ref Range   WBC 6.8 3.6 - 11.0 K/uL   RBC 3.51 (L) 3.80 - 5.20 MIL/uL   Hemoglobin 10.4 (L) 12.0 - 16.0 g/dL   HCT 32.1 (L) 35.0 - 47.0 %   MCV 91.5 80.0 - 100.0 fL   MCH 29.7 26.0 - 34.0 pg  MCHC 32.5 32.0 - 36.0 g/dL   RDW 14.8 (H) 11.5 - 14.5 %   Platelets 291 150 - 440 K/uL    Comment: Performed at [facility], [address] [place], [place] [address]  HIV antibody (Routine Testing)     Status: None   Collection Time: 06/06/17  5:27 AM  Result Value Ref Range   HIV Screen 4th Generation wRfx Non Reactive Non Reactive    Comment: (NOTE) Performed At: [facility] [place], [place] [id number] Rush Farmer MD [id number] Performed at [facility], [address] [place], [place] [address]   Urine drugs of abuse scrn w alc, routine (Ref Lab)     Status: None   Collection Time: 06/06/17  7:36 AM  Result Value Ref Range   Amphetamines, Urine Negative Cutoff=1000 ng/mL    Comment: Amphetamine  test includes Amphetamine and Methamphetamine.   Barbiturate, Ur Negative Cutoff=300 ng/mL   Benzodiazepine Quant, Ur See Final Results Cutoff=300 ng/mL   Cannabinoid Quant, Ur Negative Cutoff=50 ng/mL   Cocaine (Metab.) Negative Cutoff=300 ng/mL   Opiate Quant, Ur See Final Results Cutoff=300 ng/mL    Comment: Opiate test includes Codeine and Morphine only.   Phencyclidine, Ur Negative Cutoff=25 ng/mL   Methadone Screen, Urine Negative Cutoff=300 ng/mL   Propoxyphene, Urine Negative Cutoff=300 ng/mL   Ethanol U, Quan Negative Cutoff=0.020 %    Comment: (NOTE) Performed At: [facility] [address] [place], [place] [id number] Avis Epley PhD [id number] Performed at [facility], [address] [place], [place] [address]   Drug Profile [phone]     Status: None   Collection Time: 06/06/17  7:36 AM  Result Value Ref Range   BENZODIAZEPINES Negative Cutoff=300    Comment: (NOTE) Performed At: [facility] [address] [place], [place] [id number] Avis Epley PhD [id number] Performed at [facility], [address] [place], [place] [address]   Opiates Confirmation, Urine     Status: None   Collection Time: 06/06/17  7:36 AM  Result Value Ref Range   OPIATES Negative Cutoff=300    Comment: (NOTE) Opiate test includes Codeine and Morphine only. Performed At: [facility] [address] [place], [place] [id number] Avis Epley PhD [id number] Performed at [facility], [address] [place], [place] [address]     Radiology: Dg Chest 2 View  Result Date: 06/25/2017 CLINICAL DATA:  Shortness of breath for 8 months.  Asthma. EXAM: CHEST - 2 VIEW COMPARISON:  06/05/2017 FINDINGS: The heart size and mediastinal contours are within normal limits. Left lower lobe scarring again noted. No evidence of pulmonary infiltrate or edema. No evidence of pleural effusion. Cervical spine fusion hardware again noted. IMPRESSION: Left lower  lobe scarring.  No active cardiopulmonary disease. Electronically Signed   By: Earle Gell M.D.   On: 06/25/2017 09:27    No results found.  No results found.    Assessment and Plan: Patient Active Problem List   Diagnosis Date Noted  . Compression fracture of thoracic vertebra, sequela 07/12/2017  . Lumbar compression fracture, sequela 07/12/2017  . Nocturnal hypoxia 07/12/2017  . Simple chronic bronchitis ([place]) 07/12/2017  . Intractable back pain 06/05/2017  . Overweight (BMI 25.0-29.9) 04/08/2017  . Chronic pain syndrome 04/06/2017  . Sensory disturbance 01/08/2017  . Pain in the groin, right 10/28/2016  . Paresthesia of both hands 12/14/2015  . B12 deficiency 10/31/2015  . SOB (shortness of breath) 08/29/2015  .  History of hematuria 07/12/2015  . Urinary frequency 07/12/2015  . Vaginal atrophy 07/12/2015  . HAV (hallux abducto valgus) 07/02/2015  . Overlapping toe 07/02/2015  . Hammertoe 07/02/2015  . LBP (low back pain) 01/17/2015  . Trochanteric bursitis of right hip 01/17/2015  . PNA (pneumonia) 12/28/2014  . DDD (degenerative disc disease), lumbar 08/15/2014  . Lumbar radicular syndrome 08/15/2014  . 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 DDD (degenerative disc disease), cervical 08/15/2014  . Bilateral occipital neuralgia 08/15/2014  . Migraine 08/15/2014  . Sacroiliac joint dysfunction 08/15/2014  . Back pain, chronic 09/23/2013  . Chronic cervical pain 09/23/2013  . Arthralgia of temporomandibular joint 08/29/2013  . Absolute anemia 08/15/2013  . Chronic kidney disease (CKD), stage III (moderate) ([place]) 08/15/2013  . Abnormal serum level of alkaline phosphatase 08/15/2013  . Blush 08/15/2013  . Hematuria, microscopic 08/15/2013  . Inflamed nasal mucosa 08/15/2013  . Big thyroid 08/15/2013  . Disease of thyroid gland 08/15/2013  . Positive H. pylori test 08/15/2013  . Gastrointestinal ulcer due to Helicobacter pylori  83/11/4074  . Other specified bacterial intestinal infections 08/14/2013  . Kidney failure 08/09/2013  . SIRS (systemic inflammatory response syndrome) ([place]) 08/09/2013  . Systemic inflammatory response syndrome (SIRS) ([place]) 08/09/2013    1. COPD mild by PFT her last FEV1 was 74% of predicted. She is doing well with the inhalers. She is on proair and also breo. She is using as prescribed 2. Echo results were reviewed today she does not have systolic or diastolic dysfunction noted on the echo. 3. Pneumonia lobar resolved will monitor 4. Oxygen dependent she is currently on 3lpm at night and will be continued  General Counseling: I have discussed the findings of the evaluation and examination with Rachael Jordan.  I have also discussed any further diagnostic evaluation thatmay be needed or ordered today. Rachael Jordan verbalizes understanding of the findings of todays visit. We also reviewed her medications today and discussed drug interactions and side effects including but not limited excessive drowsiness and altered mental states. We also discussed that there is always a risk not just to her but also people around her. she has been encouraged to call the office with any questions or concerns that should arise related to todays visit.    Time spent: 64mn  I have personally obtained a history, examined the patient, evaluated laboratory and imaging results, formulated the assessment and plan and placed orders.    SAllyne Gee MD FGood Samaritan Hospital - West IslipPulmonary and Critical Care Sleep medicine

## 2017-07-28 NOTE — Patient Instructions (Signed)

## 2017-07-31 ENCOUNTER — Ambulatory Visit: Payer: Self-pay | Admitting: Urology

## 2017-09-01 ENCOUNTER — Ambulatory Visit: Payer: BLUE CROSS/BLUE SHIELD | Admitting: Urology

## 2017-09-03 ENCOUNTER — Other Ambulatory Visit: Payer: Self-pay | Admitting: Orthopedic Surgery

## 2017-09-03 ENCOUNTER — Other Ambulatory Visit (HOSPITAL_COMMUNITY): Payer: Self-pay | Admitting: Orthopedic Surgery

## 2017-09-03 DIAGNOSIS — M546 Pain in thoracic spine: Secondary | ICD-10-CM

## 2017-09-15 ENCOUNTER — Ambulatory Visit: Payer: BLUE CROSS/BLUE SHIELD

## 2017-09-27 ENCOUNTER — Ambulatory Visit
Admission: RE | Admit: 2017-09-27 | Discharge: 2017-09-27 | Disposition: A | Discharge status: Home / Self Care | Payer: BLUE CROSS/BLUE SHIELD | Source: Ambulatory Visit | Attending: Orthopedic Surgery | Admitting: Orthopedic Surgery

## 2017-09-27 DIAGNOSIS — R600 Localized edema: Secondary | ICD-10-CM | POA: Diagnosis not present

## 2017-09-27 DIAGNOSIS — M47894 Other spondylosis, thoracic region: Secondary | ICD-10-CM | POA: Insufficient documentation

## 2017-09-27 DIAGNOSIS — M546 Pain in thoracic spine: Secondary | ICD-10-CM | POA: Insufficient documentation

## 2017-09-27 DIAGNOSIS — M4854XA Collapsed vertebra, not elsewhere classified, thoracic region, initial encounter for fracture: Secondary | ICD-10-CM | POA: Diagnosis not present
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
## 2017-09-29 ENCOUNTER — Encounter: Payer: Self-pay | Admitting: Urology

## 2017-09-29 ENCOUNTER — Ambulatory Visit: Payer: BLUE CROSS/BLUE SHIELD | Admitting: Urology

## 2017-09-29 VITALS — BP 155/91 | HR 71 | Wt 173.0 lb

## 2017-09-29 DIAGNOSIS — N952 Postmenopausal atrophic vaginitis: Secondary | ICD-10-CM | POA: Diagnosis not present

## 2017-09-29 DIAGNOSIS — R350 Frequency of micturition: Secondary | ICD-10-CM

## 2017-09-29 DIAGNOSIS — Z87448 Personal history of other diseases of urinary system: Secondary | ICD-10-CM

## 2017-09-29 LAB — URINALYSIS, COMPLETE
Bilirubin, UA: NEGATIVE
Glucose, UA: NEGATIVE
KETONES UA: NEGATIVE
LEUKOCYTES UA: NEGATIVE
Nitrite, UA: NEGATIVE
PROTEIN UA: NEGATIVE
RBC, UA: NEGATIVE
SPEC GRAV UA: 1.015 (ref 1.005–1.030)
Urobilinogen, Ur: 0.2 mg/dL (ref 0.2–1.0)
pH, UA: 6.5 (ref 5.0–7.5)

## 2017-09-29 LAB — MICROSCOPIC EXAMINATION: WBC, UA: NONE SEEN /hpf (ref 0–5)

## 2017-09-29 LAB — BLADDER SCAN AMB NON-IMAGING: Scan Result: 0

## 2017-09-29 MED ORDER — MIRABEGRON ER 25 MG PO TB24: 25.0000 mg | ORAL_TABLET | Freq: Every day | ORAL | 3 refills | Status: DC

## 2017-09-29 MED ORDER — DIAZEPAM 10 MG PO TABS: ORAL_TABLET | ORAL | 0 refills | Status: DC

## 2017-09-30 LAB — BUN+CREAT
BUN/Creatinine Ratio: 11 — ABNORMAL LOW (ref 12–28)
BUN: 9 mg/dL (ref 8–27)
Creatinine, Ser: 0.85 mg/dL (ref 0.57–1.00)
GFR calc Af Amer: 86 mL/min/{1.73_m2} (ref >59)
GFR calc non Af Amer: 75 mL/min/{1.73_m2} (ref >59)

## 2017-10-01 LAB — CULTURE, URINE COMPREHENSIVE

## 2017-10-03 ENCOUNTER — Ambulatory Visit
Admission: RE | Admit: 2017-10-03 | Discharge: 2017-10-03 | Disposition: A | Discharge status: Home / Self Care | Payer: BLUE CROSS/BLUE SHIELD | Source: Ambulatory Visit | Attending: Family Medicine | Admitting: Family Medicine

## 2017-10-03 ENCOUNTER — Other Ambulatory Visit: Payer: Self-pay | Admitting: Family Medicine

## 2017-10-03 DIAGNOSIS — M25562 Pain in left knee: Secondary | ICD-10-CM | POA: Diagnosis present

## 2017-10-03 DIAGNOSIS — G8929 Other chronic pain: Secondary | ICD-10-CM

## 2017-10-03 DIAGNOSIS — M25561 Pain in right knee: Principal | ICD-10-CM

## 2017-10-20 ENCOUNTER — Ambulatory Visit
Admission: RE | Admit: 2017-10-20 | Discharge: 2017-10-20 | Disposition: A | Discharge status: Home / Self Care | Payer: BLUE CROSS/BLUE SHIELD | Source: Ambulatory Visit | Attending: Urology | Admitting: Urology

## 2017-10-20 DIAGNOSIS — Z87448 Personal history of other diseases of urinary system: Secondary | ICD-10-CM | POA: Insufficient documentation

## 2017-10-20 DIAGNOSIS — R3129 Other microscopic hematuria: Secondary | ICD-10-CM | POA: Insufficient documentation

## 2017-10-20 MED ORDER — IOPAMIDOL (ISOVUE-300) INJECTION 61%
125.0000 mL | Freq: Once | INTRAVENOUS | Status: AC | PRN
  Administered 2017-10-20: 125 mL via INTRAVENOUS

## 2017-10-21 ENCOUNTER — Other Ambulatory Visit: Payer: Self-pay

## 2017-10-21 ENCOUNTER — Emergency Department
Admission: EM | Admit: 2017-10-21 | Discharge: 2017-10-21 | Disposition: A | Discharge status: Home / Self Care | Payer: BLUE CROSS/BLUE SHIELD | Attending: Emergency Medicine | Admitting: Emergency Medicine

## 2017-10-21 DIAGNOSIS — Z79899 Other long term (current) drug therapy: Secondary | ICD-10-CM | POA: Insufficient documentation

## 2017-10-21 DIAGNOSIS — H04123 Dry eye syndrome of bilateral lacrimal glands: Secondary | ICD-10-CM | POA: Diagnosis not present

## 2017-10-21 DIAGNOSIS — N183 Chronic kidney disease, stage 3 (moderate): Secondary | ICD-10-CM | POA: Insufficient documentation

## 2017-10-21 DIAGNOSIS — H5713 Ocular pain, bilateral: Secondary | ICD-10-CM | POA: Diagnosis not present

## 2017-10-21 MED ORDER — EYE WASH OPHTH SOLN
1.0000 [drp] | OPHTHALMIC | Status: DC | PRN
  Filled 2017-10-21: qty 118

## 2017-10-21 MED ORDER — ERYTHROMYCIN 5 MG/GM OP OINT: 1.0000 "application " | TOPICAL_OINTMENT | Freq: Every day | OPHTHALMIC | 0 refills | Status: DC

## 2017-10-21 MED ORDER — TETRACAINE HCL 0.5 % OP SOLN
2.0000 [drp] | Freq: Once | OPHTHALMIC | Status: AC
  Administered 2017-10-21: 2 [drp] via OPHTHALMIC
  Filled 2017-10-21: qty 4

## 2017-10-21 MED ORDER — OLOPATADINE HCL 0.1 % OP SOLN: 1.0000 [drp] | Freq: Two times a day (BID) | OPHTHALMIC | 12 refills | Status: DC

## 2017-10-21 MED ORDER — FLUORESCEIN SODIUM 1 MG OP STRP
1.0000 | ORAL_STRIP | Freq: Once | OPHTHALMIC | Status: AC
  Administered 2017-10-21: 1 via OPHTHALMIC
  Filled 2017-10-21: qty 1

## 2017-10-21 NOTE — ED Provider Notes (Signed)
[facility] Emergency Department Provider Note  ____________________________________________   First MD Initiated Contact with Patient 10/21/17 1727     (approximate)  I have reviewed the triage vital signs and the nursing notes.   HISTORY  Chief Complaint Eye Pain    HPI Ahlani Older is a 62 y.o. female presents emergency department complaining of dry eyes with burning and hurting.  She has blurred vision due to the dry eyes.  She states that she was seen by an eye doctor last year was told does not sleep under the fan but she continues to sleep underneath her see on fan.  She states she awoke this morning with the symptoms.  And she is used several Visine type products without relief.   She denies headache.  She denies any pain behind the eyes.   Past Medical History:  Diagnosis Date  . Acid reflux   . Anxiety   . Arthritis   . Bronchitis 09/2015  . Gross hematuria   . H. pylori infection   . Heart murmur   . Kidney failure   . Lung abnormality    "damaged lung due to pneumonia"  . Neck pain   . Nephrolithiasis   . Pneumonia   . Pneumothorax   . Scoliosis   . Urinary frequency   . Vaginal atrophy     Patient Active Problem List   Diagnosis Date Noted  . Compression fracture of thoracic vertebra, sequela 07/12/2017  . Lumbar compression fracture, sequela 07/12/2017  . Nocturnal hypoxia 07/12/2017  . Simple chronic bronchitis ([place]) 07/12/2017  . Intractable back pain 06/05/2017  . Overweight (BMI 25.0-29.9) 04/08/2017  . Chronic pain syndrome 04/06/2017  . Sensory disturbance 01/08/2017  . Pain in the groin, right 10/28/2016  . Paresthesia of both hands 12/14/2015  . B12 deficiency 10/31/2015  . SOB (shortness of breath) 08/29/2015  . History of hematuria 07/12/2015  . Urinary frequency 07/12/2015  . Vaginal atrophy 07/12/2015  . HAV (hallux abducto valgus) 07/02/2015  . Overlapping toe 07/02/2015  . Hammertoe 07/02/2015  .  LBP (low back pain) 01/17/2015  . Trochanteric bursitis of right hip 01/17/2015  . PNA (pneumonia) 12/28/2014  . DDD (degenerative disc disease), lumbar 08/15/2014  . Lumbar radicular syndrome 08/15/2014  . 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 DDD (degenerative disc disease), cervical 08/15/2014  . Bilateral occipital neuralgia 08/15/2014  . Migraine 08/15/2014  . Sacroiliac joint dysfunction 08/15/2014  . Back pain, chronic 09/23/2013  . Chronic cervical pain 09/23/2013  . Arthralgia of temporomandibular joint 08/29/2013  . Absolute anemia 08/15/2013  . Chronic kidney disease (CKD), stage III (moderate) ([place]) 08/15/2013  . Abnormal serum level of alkaline phosphatase 08/15/2013  . Blush 08/15/2013  . Hematuria, microscopic 08/15/2013  . Inflamed nasal mucosa 08/15/2013  . Big thyroid 08/15/2013  . Disease of thyroid gland 08/15/2013  . Positive H. pylori test 08/15/2013  . Gastrointestinal ulcer due to Helicobacter pylori 25/07/3974  . Other specified bacterial intestinal infections 08/14/2013  . Kidney failure 08/09/2013  . SIRS (systemic inflammatory response syndrome) ([place]) 08/09/2013  . Systemic inflammatory response syndrome (SIRS) ([place]) 08/09/2013    Past Surgical History:  Procedure Laterality Date  . ABDOMINAL HYSTERECTOMY     vaginal  . APPENDECTOMY    . FOOT SURGERY Left    10/2015  . NECK SURGERY      Prior to Admission medications   Medication Sig Start Date End Date Taking? Authorizing Provider  azelastine (  ASTELIN) 0.1 % nasal spray Place 1 spray into the nose 2 (two) times daily.  07/05/15 09/29/17  [provider]  Biotin 10000 MCG TABS Take 1 tablet by mouth daily.    [provider]  Calcium Carbonate-Vitamin D3 600-400 MG-UNIT TABS Take 1 tablet by mouth daily.     [provider]  celecoxib (CELEBREX) 200 MG capsule Take 200 mg by mouth 2 (two) times daily.    [provider]    cetirizine (ZYRTEC) 10 MG tablet Take 10 mg by mouth at bedtime. Reported on 08/01/2015    [provider]  diazepam (VALIUM) 10 MG tablet Take 30 minutes prior to the cystoscopy 09/29/17   Zara Council A, PA-C  diclofenac sodium (VOLTAREN) 1 % GEL Apply 2-4 g topically 4 (four) times daily.    [provider]  docusate sodium (COLACE) 100 MG capsule Take 100 mg by mouth daily as needed for mild constipation.     [provider]  erythromycin ophthalmic ointment Place 1 application into both eyes at bedtime. 10/21/17   Joshoa Shawler, Linden Dolin, PA-C  gabapentin (NEURONTIN) 800 MG tablet Take by mouth. 02/20/17 02/20/18  [provider]  ipratropium-albuterol (DUONEB) 0.5-2.5 (3) MG/3ML SOLN Take 3 mLs by nebulization every 4 (four) hours as needed. 01/13/15   Victorino Dike, FNP  linaclotide Rolan Lipa) 72 MCG capsule Take by mouth. 06/13/17 06/14/18  [provider]  mirabegron ER (MYRBETRIQ) 25 MG TB24 tablet Take 1 tablet (25 mg total) by mouth daily. 09/29/17   Zara Council A, PA-C  montelukast (SINGULAIR) 10 MG tablet Take 10 mg by mouth daily.    [provider]  Multiple Vitamins-Minerals (CENTRUM ADULTS PO) Take by mouth. Reported on 09/28/2015    [provider]  olopatadine (PATANOL) 0.1 % ophthalmic solution Place 1 drop into both eyes 2 (two) times daily. 10/21/17   Riata Ikeda, Linden Dolin, PA-C  Oxycodone HCl 10 MG TABS Take 5-10 mg by mouth See admin instructions. Take 5-10 mg 3-5 times daily if tolerated    [provider]  phentermine 15 MG capsule Take 15 mg by mouth daily. Reported on 08/07/2015    [provider]  promethazine (PHENERGAN) 25 MG tablet Take 25 mg by mouth every 6 (six) hours as needed for nausea or vomiting.    [provider]  rizatriptan (MAXALT) 10 MG tablet Take 10 mg by mouth as needed for migraine. May repeat in 2 hours if needed    [provider]  tiZANidine (ZANAFLEX) 4 MG tablet  Take 4 mg by mouth 4 (four) times daily.     [provider]  traZODone (DESYREL) 100 MG tablet Take 100 mg by mouth at bedtime.    [provider]  valACYclovir (VALTREX) 1000 MG tablet Take by mouth. 06/13/17 06/13/18  [provider]  vitamin C (ASCORBIC ACID) 500 MG tablet Take 500 mg by mouth daily.    [provider]    Allergies Grapefruit bioflavonoid complex; Grapefruit extract; Propoxyphene; and Prednisolone  Family History  Problem Relation Age of Onset  . Arthritis Mother   . Asthma Mother   . Hyperlipidemia Mother   . Hypertension Mother   . Diabetes Sister   . Cancer Maternal Aunt        esophagus  . Breast cancer Neg Hx   . Kidney cancer Neg Hx   . Bladder Cancer Neg Hx     Social History Social History   Tobacco Use  .  Smoking status: Never Smoker  . Smokeless tobacco: Never Used  Substance Use Topics  . Alcohol use: No    Alcohol/week: 0.0 oz  . Drug use: No    Review of Systems  Constitutional: No fever/chills Eyes: Positive for eyes burning and irritating with visual changes as afternoon. ENT: No sore throat. Respiratory: Denies cough Genitourinary: Negative for dysuria. Musculoskeletal: Negative for back pain. Skin: Negative for rash.    ____________________________________________   PHYSICAL EXAM:  VITAL SIGNS: ED Triage Vitals  Enc Vitals Group     BP 10/21/17 1730 138/73     Pulse Rate 10/21/17 1730 91     Resp 10/21/17 1730 16     Temp 10/21/17 1730 98.2 F (36.8 C)     Temp Source 10/21/17 1730 Oral     SpO2 10/21/17 1730 100 %     Weight 10/21/17 1726 170 lb (77.1 kg)     Height 10/21/17 1726 5' 5"  (1.651 m)     Head Circumference --      Peak Flow --      Pain Score 10/21/17 1726 10     Pain Loc --      Pain Edu? --      Excl. in [place]? --     Constitutional: Alert and oriented. Well appearing and in no acute distress. Eyes: Conjunctivae are a little injected bilaterally.  There is some  mucus noted across the left eye.  No drainage is noted.  The patient has Vaseline on both lids.  Tetracaine 2 drops in each eye.Fluorscein stain shows no dye uptake.  No foreign body is noted.  No ulcerations are noted.  Visual acuity is left eye 20/50, right eye 20/100, and both eyes 20/70.  Patient had relief of the burning and pain with the tetracaine Head: Atraumatic. Nose: No congestion/rhinnorhea. Mouth/Throat: Mucous membranes are moist.   Neck:  supple no lymphadenopathy noted Cardiovascular: Normal rate, regular rhythm. Heart sounds are normal Respiratory: Normal respiratory effort.  No retractions, lungs c t a  GU: deferred Musculoskeletal: FROM all extremities, warm and well perfused Neurologic:  Normal speech and language.  Skin:  Skin is warm, dry and intact. No rash noted. Psychiatric: Mood and affect are normal. Speech and behavior are normal.  ____________________________________________   LABS (all labs ordered are listed, but only abnormal results are displayed)  Labs Reviewed - No data to display ____________________________________________   ____________________________________________  RADIOLOGY    ____________________________________________   PROCEDURES  Procedure(s) performed: No  Procedures    ____________________________________________   INITIAL IMPRESSION / ASSESSMENT AND PLAN / ED COURSE  Pertinent labs & imaging results that were available during my care of the patient were reviewed by me and considered in my medical decision making (see chart for details).   Patient is a 62 year old female presents emergency department complaining of her eyes burning and being irritated along with some blurred vision this afternoon.  She noticed the burning and pain this morning when she awoke.  She has had similar symptoms in the past which ended up being dry eyes.  She was told to not sleep under the ceiling fan however she has continued to do so.  On  physical exam patient appears well.  Visual acuity with both eyes is 20/70.  Peripheral vision appears to be intact.  Patient had relief of the burning and pain with the tetracaine.  No dye uptake was noted with the fluorscein stain.  No ulcerations or foreign body were noted.  Discussed physical findings with the patient.  She is to use allergy eyedrops for her allergies.  She is to use the erythromycin ophthalmic ointment nightly.  She is to follow-up with her regular eye doctor if not better in 2 to 3 days but if she is worsening she should either [place] and tell them she was seen in the emergency department or return to the emergency department.  She states she understands and will comply.  She was discharged in stable condition     As part of my medical decision making, I reviewed the following data within the [place] notes reviewed and incorporated, Old chart reviewed, Notes from prior ED visits and [place] Controlled Substance Database  ____________________________________________   FINAL CLINICAL IMPRESSION(S) / ED DIAGNOSES  Final diagnoses:  Pain of both eyes  Dry eyes      NEW MEDICATIONS STARTED DURING THIS VISIT:  New Prescriptions   ERYTHROMYCIN OPHTHALMIC OINTMENT    Place 1 application into both eyes at bedtime.   OLOPATADINE (PATANOL) 0.1 % OPHTHALMIC SOLUTION    Place 1 drop into both eyes 2 (two) times daily.     Note:  This document was prepared using Dragon voice recognition software and may include unintentional dictation errors.    Versie Starks, PA-C 10/21/17 1804    Carrie Mew, MD 10/21/17 2045

## 2017-10-21 NOTE — ED Triage Notes (Signed)
Pt reports that she woke up this am with bilat eyes burning and hurting - she now has blurred vision - reports one year ago this happened and she was told to stop sleeping under the fan

## 2017-10-21 NOTE — ED Notes (Signed)
Visual acuity preformed. L eye 20/50, R eye 20/100 and bilateral 20/70

## 2017-10-21 NOTE — Discharge Instructions (Addendum)
Follow-up with [facility] if you are worsening.  Follow-up with your regular eye doctor if just not better in 2 to 3 days.  Use medication as prescribed.  Return to the emergency department if you are worsening in your vision has changed.

## 2017-11-06 ENCOUNTER — Ambulatory Visit: Payer: BLUE CROSS/BLUE SHIELD | Admitting: Urology

## 2017-11-06 ENCOUNTER — Encounter: Payer: Self-pay | Admitting: Urology

## 2017-11-06 VITALS — BP 97/64 | HR 69 | Ht 65.0 in | Wt 169.4 lb

## 2017-11-06 DIAGNOSIS — Z87448 Personal history of other diseases of urinary system: Secondary | ICD-10-CM

## 2017-11-06 DIAGNOSIS — R350 Frequency of micturition: Secondary | ICD-10-CM

## 2017-11-06 LAB — MICROSCOPIC EXAMINATION
RBC, UA: NONE SEEN /hpf (ref 0–2)
WBC UA: NONE SEEN /HPF (ref 0–5)

## 2017-11-06 LAB — URINALYSIS, COMPLETE
Bilirubin, UA: NEGATIVE
GLUCOSE, UA: NEGATIVE
KETONES UA: NEGATIVE
Leukocytes, UA: NEGATIVE
NITRITE UA: NEGATIVE
Protein, UA: NEGATIVE
RBC, UA: NEGATIVE
SPEC GRAV UA: 1.02 (ref 1.005–1.030)
UUROB: 0.2 mg/dL (ref 0.2–1.0)
pH, UA: 7 (ref 5.0–7.5)

## 2017-11-06 MED ORDER — CIPROFLOXACIN HCL 500 MG PO TABS
500.0000 mg | ORAL_TABLET | Freq: Once | ORAL | Status: AC
Start: 2017-11-06 — End: 2017-11-06
  Administered 2017-11-06: 500 mg via ORAL

## 2017-11-06 MED ORDER — LIDOCAINE HCL URETHRAL/MUCOSAL 2 % EX GEL
1.0000 "application " | Freq: Once | CUTANEOUS | Status: AC
  Administered 2017-11-06: 1 via URETHRAL

## 2017-11-06 NOTE — Progress Notes (Signed)
   11/06/17  CC:  Chief Complaint  Patient presents with  . Cysto    HPI: 62 year old female with a history of intermittent gross hematuria.  Negative CTU and cystoscopy and 2016.  She presents for reevaluation.  CT urogram performed on 10/20/2017 showed no upper tract abnormalities.  Blood pressure 97/64, pulse 69, height 5' 5"  (1.651 m), weight 169 lb 6.4 oz (76.8 kg). NED. A&Ox3.   No respiratory distress   Abd soft, NT, ND Atrophic external genitalia with patent urethral meatus  Cystoscopy Procedure Note  Patient identification was confirmed, informed consent was obtained, and patient was prepped using Betadine solution.  Lidocaine jelly was administered per urethral meatus.    Preoperative abx where received prior to procedure.    Procedure: - Flexible cystoscope introduced, without any difficulty.   - Thorough search of the bladder revealed:    normal urethral meatus    normal urothelium    no stones    no ulcers     no tumors    no urethral polyps    no trabeculation  - Ureteral orifices were normal in position and appearance.  Post-Procedure: - Patient tolerated the procedure well  Assessment/ Plan: No significant abnormalities noted on CTU/cystoscopy.  Follow-up with La[facility] in 6 months.  Urinalysis today showed no RBCs.   Abbie Sons, MD

## 2017-11-12 ENCOUNTER — Other Ambulatory Visit: Payer: Self-pay | Admitting: Urology

## 2017-11-13 NOTE — Progress Notes (Signed)
Urine cytology showed no evidence of malignant cells.

## 2017-11-14 ENCOUNTER — Telehealth: Payer: Self-pay

## 2017-11-14 ENCOUNTER — Encounter: Payer: Self-pay | Admitting: Podiatry

## 2017-11-14 NOTE — Telephone Encounter (Signed)
Entered in error

## 2017-11-14 NOTE — Telephone Encounter (Signed)
Tried to call pt, no answer, no vm set up.

## 2017-12-01 ENCOUNTER — Ambulatory Visit: Payer: Self-pay | Admitting: Internal Medicine

## 2017-12-29 ENCOUNTER — Other Ambulatory Visit: Payer: Self-pay

## 2017-12-29 ENCOUNTER — Emergency Department
Admission: EM | Admit: 2017-12-29 | Discharge: 2017-12-29 | Discharge status: Against Medical Advice | Payer: BLUE CROSS/BLUE SHIELD | Attending: Emergency Medicine | Admitting: Emergency Medicine

## 2017-12-29 ENCOUNTER — Encounter: Payer: Self-pay | Admitting: Emergency Medicine

## 2017-12-29 ENCOUNTER — Emergency Department: Payer: BLUE CROSS/BLUE SHIELD

## 2017-12-29 DIAGNOSIS — Z79899 Other long term (current) drug therapy: Secondary | ICD-10-CM | POA: Insufficient documentation

## 2017-12-29 DIAGNOSIS — I10 Essential (primary) hypertension: Secondary | ICD-10-CM

## 2017-12-29 DIAGNOSIS — R42 Dizziness and giddiness: Secondary | ICD-10-CM | POA: Diagnosis not present

## 2017-12-29 DIAGNOSIS — R51 Headache: Secondary | ICD-10-CM | POA: Diagnosis not present

## 2017-12-29 DIAGNOSIS — N183 Chronic kidney disease, stage 3 (moderate): Secondary | ICD-10-CM | POA: Diagnosis not present

## 2017-12-29 DIAGNOSIS — I129 Hypertensive chronic kidney disease with stage 1 through stage 4 chronic kidney disease, or unspecified chronic kidney disease: Secondary | ICD-10-CM | POA: Diagnosis not present

## 2017-12-29 LAB — TROPONIN I: Troponin I: <0.03 ng/mL (ref <0.03)

## 2017-12-29 LAB — COMPREHENSIVE METABOLIC PANEL
ALBUMIN: 4.5 g/dL (ref 3.5–5.0)
ALT: 15 U/L (ref 0–44)
ANION GAP: 10 (ref 5–15)
AST: 24 U/L (ref 15–41)
Alkaline Phosphatase: 82 U/L (ref 38–126)
BILIRUBIN TOTAL: 0.6 mg/dL (ref 0.3–1.2)
BUN: 9 mg/dL (ref 6–20)
CHLORIDE: 103 mmol/L (ref 98–111)
CO2: 26 mmol/L (ref 22–32)
Calcium: 10 mg/dL (ref 8.9–10.3)
Creatinine, Ser: 0.9 mg/dL (ref 0.44–1.00)
GFR calc Af Amer: >60 mL/min (ref >60)
GFR calc non Af Amer: >60 mL/min (ref >60)
GLUCOSE: 111 mg/dL — AB (ref 70–99)
Potassium: 3.3 mmol/L — ABNORMAL LOW (ref 3.5–5.1)
SODIUM: 139 mmol/L (ref 135–145)
TOTAL PROTEIN: 8.7 g/dL — AB (ref 6.5–8.1)

## 2017-12-29 LAB — URINALYSIS, COMPLETE (UACMP) WITH MICROSCOPIC
BACTERIA UA: NONE SEEN
Bilirubin Urine: NEGATIVE
GLUCOSE, UA: NEGATIVE mg/dL
Hgb urine dipstick: NEGATIVE
KETONES UR: 5 mg/dL — AB
LEUKOCYTES UA: NEGATIVE
NITRITE: NEGATIVE
PH: 7 (ref 5.0–8.0)
Protein, ur: NEGATIVE mg/dL
Specific Gravity, Urine: 1.006 (ref 1.005–1.030)
Squamous Epithelial / LPF: NONE SEEN (ref 0–5)

## 2017-12-29 LAB — CBC
HCT: 42.6 % (ref 36.0–46.0)
Hemoglobin: 13.8 g/dL (ref 12.0–15.0)
MCH: 29.1 pg (ref 26.0–34.0)
MCHC: 32.4 g/dL (ref 30.0–36.0)
MCV: 89.9 fL (ref 80.0–100.0)
NRBC: 0 % (ref 0.0–0.2)
PLATELETS: 417 10*3/uL — AB (ref 150–400)
RBC: 4.74 MIL/uL (ref 3.87–5.11)
RDW: 12.9 % (ref 11.5–15.5)
WBC: 10.2 10*3/uL (ref 4.0–10.5)

## 2017-12-29 MED ORDER — MECLIZINE HCL 25 MG PO TABS: 25.0000 mg | ORAL_TABLET | Freq: Three times a day (TID) | ORAL | 1 refills | Status: DC | PRN

## 2017-12-29 MED ORDER — KETOROLAC TROMETHAMINE 10 MG PO TABS: 10.0000 mg | ORAL_TABLET | Freq: Four times a day (QID) | ORAL | 0 refills | Status: DC | PRN

## 2017-12-29 MED ORDER — SODIUM CHLORIDE 0.9 % IV BOLUS
1000.0000 mL | Freq: Once | INTRAVENOUS | Status: AC
  Administered 2017-12-29: 1000 mL via INTRAVENOUS

## 2017-12-29 MED ORDER — SCOPOLAMINE 1 MG/3DAYS TD PT72: 1.0000 | MEDICATED_PATCH | TRANSDERMAL | 1 refills | Status: DC

## 2017-12-29 MED ORDER — MORPHINE SULFATE (PF) 4 MG/ML IV SOLN
4.0000 mg | Freq: Once | INTRAVENOUS | Status: AC
  Administered 2017-12-29: 4 mg via INTRAVENOUS
  Filled 2017-12-29: qty 1

## 2017-12-29 MED ORDER — MECLIZINE HCL 25 MG PO TABS
25.0000 mg | ORAL_TABLET | Freq: Once | ORAL | Status: AC
  Administered 2017-12-29: 25 mg via ORAL
  Filled 2017-12-29: qty 1

## 2017-12-29 MED ORDER — SCOPOLAMINE 1 MG/3DAYS TD PT72: 1.0000 | MEDICATED_PATCH | TRANSDERMAL | 1 refills | Status: AC

## 2017-12-29 MED ORDER — METOCLOPRAMIDE HCL 5 MG/ML IJ SOLN
10.0000 mg | Freq: Once | INTRAMUSCULAR | Status: AC
  Administered 2017-12-29: 10 mg via INTRAVENOUS
  Filled 2017-12-29: qty 2

## 2017-12-29 MED ORDER — DIPHENHYDRAMINE HCL 50 MG/ML IJ SOLN
50.0000 mg | Freq: Once | INTRAMUSCULAR | Status: AC
  Administered 2017-12-29: 50 mg via INTRAVENOUS
  Filled 2017-12-29: qty 1

## 2017-12-29 MED ORDER — KETOROLAC TROMETHAMINE 30 MG/ML IJ SOLN
15.0000 mg | INTRAMUSCULAR | Status: AC
  Administered 2017-12-29: 15 mg via INTRAVENOUS
  Filled 2017-12-29: qty 1

## 2017-12-29 MED ORDER — METOCLOPRAMIDE HCL 10 MG PO TABS: 10.0000 mg | ORAL_TABLET | Freq: Four times a day (QID) | ORAL | 0 refills | Status: DC | PRN

## 2017-12-29 NOTE — Discharge Instructions (Addendum)
Your labs, CT scan of the head, and MRI of the brain were all unremarkable today.  Take medications as needed to control your symptoms and follow-up with an ear nose throat specialist for closer evaluation of your vertigo which appears to be due to inner ear dysfunction.

## 2017-12-29 NOTE — ED Notes (Signed)
Patient transported to MRI 

## 2017-12-29 NOTE — ED Notes (Signed)
Pt moved back to main lobby.

## 2017-12-29 NOTE — ED Notes (Signed)
Pt ambulatory to toilet with standby assist, pt denies dizziness, complains of severe headache.  Medication given; see MAR.

## 2017-12-29 NOTE — ED Provider Notes (Signed)
 -----------------------------------------   4:21 PM on 12/29/2017 -----------------------------------------  Patient reassessed, reporting persistent dizziness and severe frontal headache.  I will give a migraine cocktail while obtaining MRI/MRA of the brain to evaluate for possible cerebellar versus thalamic infarct..  She was observed to ambulate with a steady gait when going to the bathroom.  ----------------------------------------- 10:07 PM on 12/29/2017 -----------------------------------------  MRI unremarkable.  No acute stroke or other neurologic process.  I doubt encephalitis or meningitis.  Vital signs are unremarkable.  She is feeling much better after receiving migraine cocktail as well as morphine.  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  She does note that the symptoms are associated with a buzzing feeling in her left ear and that her symptoms are worse when she turns her head to the left.  No reason to suspect carotid dissection or vertebral dissection.   Carrie Mew, MD 12/29/17 2208

## 2017-12-29 NOTE — ED Provider Notes (Signed)
[facility] Emergency Department Provider Note ____________________________________________   First MD Initiated Contact with Patient 12/29/17 1326     (approximate)  I have reviewed the triage vital signs and the nursing notes.   HISTORY  Chief Complaint Dizziness   HPI Rachael Jordan is a 62 y.o. female with a history of kidney failure as well as anxiety and reflux was presented to the emergency department today complaining of vertigo.  She says that she has been vertiginous since this past Wednesday when she fell.  However, says that she has had intermittent vertigo over the past 2 years.  Mild rhinorrhea today.  Also says that she feels a "vibration" in her ears, bilaterally.  Denies any pressure or ringing in her ears.  Denies any urinary frequency or burning.  Denies any weakness or numbness.  States that the vertigo worsens when she turns her head from side to side.  Also complaining of a frontal headache that is a 10 out of 10 and feels like a tightness.  Says that the dizziness started before the headache.  Does not report any sudden onset of headache or neck stiffness/pain.  Also has been tracking her blood pressure as well as pulse and found that her pulse is been ranging from the 40s to the 110s and blood pressures been ranging in the 16B systolic all the way up to almost 200.  Past Medical History:  Diagnosis Date  . Acid reflux   . Anxiety   . Arthritis   . Bronchitis 09/2015  . Gross hematuria   . H. pylori infection   . Heart murmur   . Kidney failure   . Lung abnormality    "damaged lung due to pneumonia"  . Neck pain   . Nephrolithiasis   . Pneumonia   . Pneumothorax   . Scoliosis   . Urinary frequency   . Vaginal atrophy     Patient Active Problem List   Diagnosis Date Noted  . Compression fracture of thoracic vertebra, sequela 07/12/2017  . Lumbar compression fracture, sequela 07/12/2017  . Nocturnal hypoxia 07/12/2017  .  Simple chronic bronchitis ([place]) 07/12/2017  . Intractable back pain 06/05/2017  . Overweight (BMI 25.0-29.9) 04/08/2017  . Chronic pain syndrome 04/06/2017  . Sensory disturbance 01/08/2017  . Pain in the groin, right 10/28/2016  . Paresthesia of both hands 12/14/2015  . B12 deficiency 10/31/2015  . SOB (shortness of breath) 08/29/2015  . History of hematuria 07/12/2015  . Urinary frequency 07/12/2015  . Vaginal atrophy 07/12/2015  . HAV (hallux abducto valgus) 07/02/2015  . Overlapping toe 07/02/2015  . Hammertoe 07/02/2015  . LBP (low back pain) 01/17/2015  . Trochanteric bursitis of right hip 01/17/2015  . PNA (pneumonia) 12/28/2014  . DDD (degenerative disc disease), lumbar 08/15/2014  . Lumbar radicular syndrome 08/15/2014  . 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 DDD (degenerative disc disease), cervical 08/15/2014  . Bilateral occipital neuralgia 08/15/2014  . Migraine 08/15/2014  . Sacroiliac joint dysfunction 08/15/2014  . Back pain, chronic 09/23/2013  . Chronic cervical pain 09/23/2013  . Arthralgia of temporomandibular joint 08/29/2013  . Absolute anemia 08/15/2013  . Chronic kidney disease (CKD), stage III (moderate) ([place]) 08/15/2013  . Abnormal serum level of alkaline phosphatase 08/15/2013  . Blush 08/15/2013  . Hematuria, microscopic 08/15/2013  . Inflamed nasal mucosa 08/15/2013  . Big thyroid 08/15/2013  . Disease of thyroid gland 08/15/2013  . Positive H. pylori test 08/15/2013  . Gastrointestinal  ulcer due to Helicobacter pylori 46/65/9935  . Other specified bacterial intestinal infections 08/14/2013  . Kidney failure 08/09/2013  . SIRS (systemic inflammatory response syndrome) ([place]) 08/09/2013  . Systemic inflammatory response syndrome (SIRS) ([place]) 08/09/2013    Past Surgical History:  Procedure Laterality Date  . ABDOMINAL HYSTERECTOMY     vaginal  . APPENDECTOMY    . FOOT SURGERY Left    10/2015  . NECK  SURGERY      Prior to Admission medications   Medication Sig Start Date End Date Taking? Authorizing Provider  azelastine (ASTELIN) 0.1 % nasal spray Place 1 spray into the nose 2 (two) times daily.  07/05/15 09/29/17  [provider]  Biotin 10000 MCG TABS Take 1 tablet by mouth daily.    [provider]  Calcium Carbonate-Vitamin D3 600-400 MG-UNIT TABS Take 1 tablet by mouth daily.     [provider]  celecoxib (CELEBREX) 200 MG capsule Take 200 mg by mouth 2 (two) times daily.    [provider]  cetirizine (ZYRTEC) 10 MG tablet Take 10 mg by mouth at bedtime. Reported on 08/01/2015    [provider]  diazepam (VALIUM) 10 MG tablet Take 30 minutes prior to the cystoscopy 09/29/17   Zara Council A, PA-C  diclofenac sodium (VOLTAREN) 1 % GEL Apply 2-4 g topically 4 (four) times daily.    [provider]  docusate sodium (COLACE) 100 MG capsule Take 100 mg by mouth daily as needed for mild constipation.     [provider]  erythromycin ophthalmic ointment Place 1 application into both eyes at bedtime. 10/21/17   Fisher, Linden Dolin, PA-C  gabapentin (NEURONTIN) 800 MG tablet Take by mouth. 02/20/17 02/20/18  [provider]  hydrocortisone 2.5 % ointment Apply topically. 10/03/17 10/03/18  [provider]  ipratropium-albuterol (DUONEB) 0.5-2.5 (3) MG/3ML SOLN Take 3 mLs by nebulization every 4 (four) hours as needed. 01/13/15   Victorino Dike, FNP  linaclotide Rolan Lipa) 72 MCG capsule Take by mouth. 06/13/17 06/14/18  [provider]  mirabegron ER (MYRBETRIQ) 25 MG TB24 tablet Take 1 tablet (25 mg total) by mouth daily. 09/29/17   Zara Council A, PA-C  montelukast (SINGULAIR) 10 MG tablet Take 10 mg by mouth daily.    [provider]  Multiple Vitamins-Minerals (CENTRUM ADULTS PO) Take by mouth. Reported on 09/28/2015    [provider]  olopatadine (PATANOL) 0.1 % ophthalmic solution Place  1 drop into both eyes 2 (two) times daily. 10/21/17   Fisher, Linden Dolin, PA-C  Oxycodone HCl 10 MG TABS Take 5-10 mg by mouth See admin instructions. Take 5-10 mg 3-5 times daily if tolerated    [provider]  phentermine 15 MG capsule Take 15 mg by mouth daily. Reported on 08/07/2015    [provider]  promethazine (PHENERGAN) 25 MG tablet Take 25 mg by mouth every 6 (six) hours as needed for nausea or vomiting.    [provider]  rizatriptan (MAXALT) 10 MG tablet Take 10 mg by mouth as needed for migraine. May repeat in 2 hours if needed    [provider]  tiZANidine (ZANAFLEX) 4 MG tablet Take 4 mg by mouth 4 (four) times daily.     [provider]  traZODone (DESYREL) 100 MG tablet Take 100 mg by mouth at bedtime.    [provider]  valACYclovir (VALTREX) 1000 MG tablet Take by mouth. 06/13/17 06/13/18  [provider]  vitamin C (ASCORBIC  ACID) 500 MG tablet Take 500 mg by mouth daily.    [provider]    Allergies Grapefruit bioflavonoid complex; Grapefruit extract; Propoxyphene; and Prednisolone  Family History  Problem Relation Age of Onset  . Arthritis Mother   . Asthma Mother   . Hyperlipidemia Mother   . Hypertension Mother   . Diabetes Sister   . Cancer Maternal Aunt        esophagus  . Breast cancer Neg Hx   . Kidney cancer Neg Hx   . Bladder Cancer Neg Hx     Social History Social History   Tobacco Use  . Smoking status: Never Smoker  . Smokeless tobacco: Never Used  Substance Use Topics  . Alcohol use: No    Alcohol/week: 0.0 standard drinks  . Drug use: No    Review of Systems  Constitutional: No fever/chills Eyes: No visual changes. ENT: No sore throat. Cardiovascular: Denies chest pain. Respiratory: Denies shortness of breath. Gastrointestinal: No abdominal pain.  no vomiting.  No diarrhea.  No constipation. Genitourinary: Negative for dysuria. Musculoskeletal: Negative for back  pain. Skin: Negative for rash. Neurological: Negative for focal weakness or numbness.   ____________________________________________   PHYSICAL EXAM:  VITAL SIGNS: ED Triage Vitals  Enc Vitals Group     BP 12/29/17 1046 (!) 168/93     Pulse Rate 12/29/17 1046 (!) 120     Resp 12/29/17 1046 18     Temp 12/29/17 1046 98.5 F (36.9 C)     Temp Source 12/29/17 1046 Oral     SpO2 12/29/17 1046 98 %     Weight 12/29/17 1052 163 lb (73.9 kg)     Height 12/29/17 1052 5' 5.5" (1.664 m)     Head Circumference --      Peak Flow --      Pain Score 12/29/17 1051 10     Pain Loc --      Pain Edu? --      Excl. in [place]? --     Constitutional: Alert and oriented. Well appearing and in no acute distress. Eyes: Conjunctivae are normal.  Head: Atraumatic.  Normal TMs bilaterally. Nose: No congestion/rhinnorhea. Mouth/Throat: Mucous membranes are moist.  Neck: No stridor.   Cardiovascular: Normal rate, regular rhythm. Grossly normal heart sounds.  Heart rate 89 at this time. Respiratory: Normal respiratory effort.  No retractions. Lungs CTAB. Gastrointestinal: Soft and nontender. No distention. No CVA tenderness. Musculoskeletal: No lower extremity tenderness nor edema.  No joint effusions. Neurologic:  Normal speech and language. No gross focal neurologic deficits are appreciated.  No ataxia on heel-to-shin nor is there any ataxia on finger-nose testing.  No nystagmus. Skin:  Skin is warm, dry and intact. No rash noted. Psychiatric: Mood and affect are normal. Speech and behavior are normal.  ____________________________________________   LABS (all labs ordered are listed, but only abnormal results are displayed)  Labs Reviewed  CBC - Abnormal; Notable for the following components:      Result Value   Platelets 417 (*)    All other components within normal limits  COMPREHENSIVE METABOLIC PANEL - Abnormal; Notable for the following components:   Potassium 3.3 (*)    Glucose, Bld 111  (*)    Total Protein 8.7 (*)    All other components within normal limits  URINALYSIS, COMPLETE (UACMP) WITH MICROSCOPIC - Abnormal; Notable for the following components:   Color, Urine YELLOW (*)    APPearance CLEAR (*)    Ketones, ur  5 (*)    All other components within normal limits  TROPONIN I   ____________________________________________  EKG  ED ECG REPORT I, Doran Stabler, the attending physician, personally viewed and interpreted this ECG.   Date: 12/29/2017  EKG Time: 1100  Rate: 108  Rhythm: sinus tachycardia  Axis: Normal  Intervals:LVH  ST&T Change: No ST segment elevation or depression.  No abnormal T wave inversion. No significant change from previous EKG ____________________________________________  RADIOLOGY  CT head without any acute process. ____________________________________________   PROCEDURES  Procedure(s) performed:   Procedures  Critical Care performed:   ____________________________________________   INITIAL IMPRESSION / ASSESSMENT AND PLAN / ED COURSE  Pertinent labs & imaging results that were available during my care of the patient were reviewed by me and considered in my medical decision making (see chart for details).  DDX: Central vertigo, peripheral vertigo, Mnire's disease, dehydration, electrolyte abnormality, CVA, tension headache, migraine type headache As part of my medical decision making, I reviewed the following data within the electronic MEDICAL RECORD NUMBER Notes from prior ED visits  ----------------------------------------- 2:57 PM on 12/29/2017 -----------------------------------------  Patient at this time is about 750 mL's of fluid left.  Will require reassessment.  Reassuring lab work as well as CT of the brain.  Signed out to Dr. Joni Fears.  Blood pressure and pulse have been labile for patient's home reporting.  However here in the emergency department pulse is been approximately in the 90s with blood  pressures in the 160s to 180s over 90s to 100s. ____________________________________________   FINAL CLINICAL IMPRESSION(S) / ED DIAGNOSES  Vertigo. Hypertension.   NEW MEDICATIONS STARTED DURING THIS VISIT:  New Prescriptions   No medications on file     Note:  This document was prepared using Dragon voice recognition software and may include unintentional dictation errors.     Orbie Pyo, MD 12/29/17 [phone]

## 2017-12-29 NOTE — ED Triage Notes (Signed)
Dizzy x 1 week. States fell 5 days ago due to unsteadiness. Intermittent headache. Denies use of blood thinners. Denies falls prior to dizziness starting. Smile symmetrical, grips and leg strength equal.

## 2018-01-01 ENCOUNTER — Other Ambulatory Visit: Payer: Self-pay | Admitting: Family Medicine

## 2018-01-01 DIAGNOSIS — I10 Essential (primary) hypertension: Secondary | ICD-10-CM

## 2018-01-06 ENCOUNTER — Ambulatory Visit
Admission: RE | Admit: 2018-01-06 | Discharge: 2018-01-06 | Disposition: A | Discharge status: Home / Self Care | Payer: BLUE CROSS/BLUE SHIELD | Source: Ambulatory Visit | Attending: Family Medicine | Admitting: Family Medicine

## 2018-01-06 DIAGNOSIS — I10 Essential (primary) hypertension: Secondary | ICD-10-CM | POA: Diagnosis present

## 2018-01-06 DIAGNOSIS — I6381 Other cerebral infarction due to occlusion or stenosis of small artery: Secondary | ICD-10-CM | POA: Insufficient documentation

## 2018-01-06 DIAGNOSIS — I672 Cerebral atherosclerosis: Secondary | ICD-10-CM | POA: Insufficient documentation

## 2018-01-06 HISTORY — DX: Essential (primary) hypertension: I10

## 2018-02-18 DIAGNOSIS — Z13820 Encounter for screening for osteoporosis: Secondary | ICD-10-CM | POA: Insufficient documentation

## 2018-02-18 DIAGNOSIS — R682 Dry mouth, unspecified: Secondary | ICD-10-CM | POA: Insufficient documentation

## 2018-03-31 DIAGNOSIS — M810 Age-related osteoporosis without current pathological fracture: Secondary | ICD-10-CM | POA: Insufficient documentation

## 2018-03-31 DIAGNOSIS — R768 Other specified abnormal immunological findings in serum: Secondary | ICD-10-CM | POA: Insufficient documentation

## 2018-05-05 ENCOUNTER — Encounter: Payer: Self-pay | Admitting: Podiatry

## 2018-05-05 ENCOUNTER — Other Ambulatory Visit: Payer: Self-pay | Admitting: Podiatry

## 2018-05-05 ENCOUNTER — Ambulatory Visit (INDEPENDENT_AMBULATORY_CARE_PROVIDER_SITE_OTHER): Payer: PRIVATE HEALTH INSURANCE

## 2018-05-05 ENCOUNTER — Ambulatory Visit: Payer: PRIVATE HEALTH INSURANCE | Admitting: Podiatry

## 2018-05-05 DIAGNOSIS — M779 Enthesopathy, unspecified: Secondary | ICD-10-CM

## 2018-05-05 DIAGNOSIS — M7741 Metatarsalgia, right foot: Secondary | ICD-10-CM

## 2018-05-05 DIAGNOSIS — B351 Tinea unguium: Secondary | ICD-10-CM

## 2018-05-05 DIAGNOSIS — M7751 Other enthesopathy of right foot: Secondary | ICD-10-CM

## 2018-05-05 DIAGNOSIS — M79671 Pain in right foot: Secondary | ICD-10-CM

## 2018-05-05 DIAGNOSIS — M21619 Bunion of unspecified foot: Secondary | ICD-10-CM | POA: Diagnosis not present

## 2018-05-06 DIAGNOSIS — M7741 Metatarsalgia, right foot: Secondary | ICD-10-CM | POA: Insufficient documentation

## 2018-05-06 DIAGNOSIS — M21619 Bunion of unspecified foot: Secondary | ICD-10-CM | POA: Insufficient documentation

## 2018-05-06 NOTE — Progress Notes (Signed)
Subjective: 63 year old female presents the office today for concerns of forefoot pain areas of the toes on the right foot only when wearing certain shoes.  This is been ongoing for 8 months.  She states that when she wears her "pumps" she gets pain of all of her foot.  She denies any recent injury or trauma she denies any swelling.  She has no pain at rest or without shoes. Denies any systemic complaints such as fevers, chills, nausea, vomiting. No acute changes since last appointment, and no other complaints at this time.   Objective: AAO x3, NAD DP/PT pulses palpable bilaterally, CRT less than 3 seconds Not able to identify any area of pinpoint bony tenderness pain vibratory sensation.  There is no edema, erythema.  Unable to palpate a neuroma.  There is no significant discomfort on the contralateral extremity.  Bunion is been doing very well in the left side she having no issues.  On the right foot bunion deformities present there is prominence of metatarsal heads. No open lesions or pre-ulcerative lesions.  No pain with calf compression, swelling, warmth, erythema  Assessment: Metatarsalgia/capsulitis right foot  Plan: -All treatment options discussed with the patient including all alternatives, risks, complications.  -X-rays were obtained and reviewed with her.  Bunion deformities present.  No evidence of acute fracture.  Elongated second metatarsal. -She has no pain on exam today and this is most with wearing shoes.  I dispensed metatarsal offloading pads.  Also a gel cushion can be helpful that I dispensed.  We will check orthotic coverage to see if we can make her more of a custom orthotic which she would like to have addressed insert.  We will check insurance and if she decides to get them I will have her follow-up with Liliane Channel. -Patient encouraged to call the office with any questions, concerns, change in symptoms.   Trula Slade DPM

## 2018-05-06 NOTE — Addendum Note (Signed)
Addended by: [place] Mon R on: 05/06/2018 05:04 PM   Modules accepted: Orders
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
## 2018-05-13 ENCOUNTER — Encounter: Payer: Self-pay | Admitting: Urology

## 2018-05-13 ENCOUNTER — Ambulatory Visit: Payer: Self-pay | Admitting: Urology

## 2018-05-20 ENCOUNTER — Telehealth: Payer: Self-pay | Admitting: *Deleted

## 2018-05-20 NOTE — Telephone Encounter (Signed)
-----   Message from Trula Slade, DPM sent at 05/19/2018  5:34 PM EST ----- Can you please let her know the culture did not show fungus and is more of damage. We can try urea cream. If still concerned about fungus we could do a compound cream from [place] that includes the antifungal and the urea.

## 2018-05-20 NOTE — Telephone Encounter (Signed)
I informed pt of Dr. Leigh Aurora review of results and orders. I informed pt of Revitaderm40 cost $22.00 and daily use to help soften nail for easier cutting and filing smooth and thin.

## 2018-06-02 ENCOUNTER — Other Ambulatory Visit: Payer: PRIVATE HEALTH INSURANCE | Admitting: Orthotics

## 2018-06-05 ENCOUNTER — Telehealth: Payer: Self-pay | Admitting: *Deleted

## 2018-06-05 NOTE — Telephone Encounter (Signed)
There are no alternative medication to Myrbetriq.  It is the only medication in its class.  The other OAB medications are in the anticholinergic class and their side effect profile consists of dry eyes, constipation, dry mouth and dementia.  They do have generics available.  Is she interested in trying one of those medications?

## 2018-06-05 NOTE — Telephone Encounter (Signed)
Received notification from patient's [facility] denied coverage of Myrbetriq 79m due to patient's insurance plan changed. Patient still taking-please advise for an alternate medication? Thanks

## 2018-06-05 NOTE — Telephone Encounter (Signed)
Unable to leave message, no voicemail.

## 2018-06-08 NOTE — Telephone Encounter (Signed)
2nd attempt, unable to leave message

## 2018-06-11 ENCOUNTER — Encounter: Payer: Self-pay | Admitting: *Deleted

## 2018-06-11 NOTE — Telephone Encounter (Signed)
Spoke with patient-she does not want to start an alternate medication with these side effects. She states she will call her insurance company to find out what they will cover since they will not cover Myrbetriq.

## 2018-06-11 NOTE — Telephone Encounter (Signed)
Left VM to return call-mailed letter.

## 2018-06-12 ENCOUNTER — Telehealth: Payer: Self-pay | Admitting: Urology

## 2018-06-12 NOTE — Telephone Encounter (Signed)
Left message on cell# VM to return call

## 2018-06-12 NOTE — Telephone Encounter (Signed)
Rachael Jordan can also try a non medical treatment for her urinary issues, PTNS.  We can send her a brochure regarding this treatment and if she is interested, we can check to see if her insurance will cover this treatment.

## 2018-06-12 NOTE — Telephone Encounter (Signed)
Spoke with patient-she would like to proceed with PTNS for treatment for OAB. Mailed brochure-she is aware due to the Covid-19 virus there may be a delay in getting scheduled. Patient verbalized understanding.

## 2018-06-30 ENCOUNTER — Other Ambulatory Visit: Payer: PRIVATE HEALTH INSURANCE | Admitting: Orthotics

## 2018-09-04 ENCOUNTER — Ambulatory Visit: Payer: PRIVATE HEALTH INSURANCE | Attending: Family Medicine | Admitting: Physical Therapy

## 2018-09-04 DIAGNOSIS — R269 Unspecified abnormalities of gait and mobility: Secondary | ICD-10-CM | POA: Insufficient documentation

## 2018-09-04 DIAGNOSIS — M25561 Pain in right knee: Secondary | ICD-10-CM | POA: Insufficient documentation

## 2018-09-04 DIAGNOSIS — M5441 Lumbago with sciatica, right side: Secondary | ICD-10-CM | POA: Diagnosis present

## 2018-09-04 DIAGNOSIS — M6281 Muscle weakness (generalized): Secondary | ICD-10-CM | POA: Diagnosis present

## 2018-09-04 DIAGNOSIS — G8929 Other chronic pain: Secondary | ICD-10-CM | POA: Insufficient documentation

## 2018-09-04 DIAGNOSIS — M25562 Pain in left knee: Secondary | ICD-10-CM | POA: Diagnosis present

## 2018-09-04 DIAGNOSIS — M5442 Lumbago with sciatica, left side: Secondary | ICD-10-CM | POA: Diagnosis present

## 2018-09-04 DIAGNOSIS — M542 Cervicalgia: Secondary | ICD-10-CM | POA: Insufficient documentation
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
## 2018-09-15 ENCOUNTER — Other Ambulatory Visit: Payer: Self-pay

## 2018-09-15 ENCOUNTER — Telehealth: Payer: Self-pay | Admitting: Urology

## 2018-09-15 ENCOUNTER — Ambulatory Visit (INDEPENDENT_AMBULATORY_CARE_PROVIDER_SITE_OTHER): Payer: PRIVATE HEALTH INSURANCE | Admitting: Family Medicine

## 2018-09-15 ENCOUNTER — Encounter: Payer: Self-pay | Admitting: Family Medicine

## 2018-09-15 VITALS — BP 133/83 | HR 109 | Ht 65.5 in | Wt 160.0 lb

## 2018-09-15 DIAGNOSIS — R310 Gross hematuria: Secondary | ICD-10-CM

## 2018-09-15 DIAGNOSIS — R350 Frequency of micturition: Secondary | ICD-10-CM

## 2018-09-15 LAB — BLADDER SCAN AMB NON-IMAGING: Scan Result: 0

## 2018-09-15 NOTE — Telephone Encounter (Signed)
Pt. Would like for clinical to call her about blood in her urine,feeling lots of pressure to urinate but when she goes to the bathroom she can not urinate.

## 2018-09-15 NOTE — Telephone Encounter (Signed)
Patient states blood started this weekend and pressure/urge to urinate is worse. She was asked to come in today for a UA and PVR on the nurse schedule to rule out infection

## 2018-09-15 NOTE — Progress Notes (Signed)
Patient presents today with urinary frequency and hematuria and lower abdominal pain. A urine was collected for UA, UCX. A PVR was performed with a residual of 0 ml. Patient states she has not been on ABX or had any Urological surgeries in the last 30 days. I reviewed the UA in office and it looks normal. We will wait to see what the Dorma Russell looks like.

## 2018-09-16 LAB — URINALYSIS, COMPLETE
Bilirubin, UA: NEGATIVE
Glucose, UA: NEGATIVE
Ketones, UA: NEGATIVE
Leukocytes,UA: NEGATIVE
Nitrite, UA: NEGATIVE
Protein,UA: NEGATIVE
RBC, UA: NEGATIVE
Specific Gravity, UA: 1.01 (ref 1.005–1.030)
Urobilinogen, Ur: 0.2 mg/dL (ref 0.2–1.0)
pH, UA: 6 (ref 5.0–7.5)

## 2018-09-16 LAB — MICROSCOPIC EXAMINATION
Bacteria, UA: NONE SEEN
RBC, Urine: NONE SEEN /hpf (ref 0–2)

## 2018-09-18 LAB — CULTURE, URINE COMPREHENSIVE

## 2018-09-20 NOTE — Addendum Note (Signed)
Addended by: Pura Spice on: 09/20/2018 07:29 PM   Modules accepted: Orders

## 2018-09-21 ENCOUNTER — Telehealth: Payer: Self-pay

## 2018-09-21 NOTE — Telephone Encounter (Signed)
Patient notified please schedule next available

## 2018-09-21 NOTE — Telephone Encounter (Signed)
-----   Message from Nori Riis, PA-C sent at 09/21/2018  7:39 AM EDT ----- Please let Mrs. Chambless know that her urine culture was negative.  She needs an office visit for further evaluation since she was having gross hematuria.

## 2018-10-02 ENCOUNTER — Ambulatory Visit: Payer: PRIVATE HEALTH INSURANCE

## 2018-10-09 ENCOUNTER — Ambulatory Visit: Payer: PRIVATE HEALTH INSURANCE

## 2018-10-14 ENCOUNTER — Telehealth: Payer: Self-pay | Admitting: Urology

## 2018-10-14 NOTE — Telephone Encounter (Signed)
Pt called office and asked to cancel all PTNS appts, did not wish to resched at this time. She did say she would keep her August appt with [facility].   FYI

## 2018-10-16 ENCOUNTER — Ambulatory Visit: Payer: PRIVATE HEALTH INSURANCE

## 2018-10-20 ENCOUNTER — Other Ambulatory Visit: Payer: Self-pay

## 2018-10-20 ENCOUNTER — Telehealth: Payer: Self-pay

## 2018-10-20 NOTE — Telephone Encounter (Signed)
Spoke with pt we received pres for breo and proair she was not that inhaler for long time pt need to been seen for refills

## 2018-10-21 ENCOUNTER — Encounter: Payer: Self-pay | Admitting: Emergency Medicine

## 2018-10-21 ENCOUNTER — Emergency Department
Admission: EM | Admit: 2018-10-21 | Discharge: 2018-10-21 | Disposition: A | Discharge status: Home / Self Care | Payer: PRIVATE HEALTH INSURANCE | Attending: Emergency Medicine | Admitting: Emergency Medicine

## 2018-10-21 DIAGNOSIS — I2510 Atherosclerotic heart disease of native coronary artery without angina pectoris: Secondary | ICD-10-CM | POA: Insufficient documentation

## 2018-10-21 DIAGNOSIS — I129 Hypertensive chronic kidney disease with stage 1 through stage 4 chronic kidney disease, or unspecified chronic kidney disease: Secondary | ICD-10-CM | POA: Insufficient documentation

## 2018-10-21 DIAGNOSIS — R51 Headache: Secondary | ICD-10-CM | POA: Insufficient documentation

## 2018-10-21 DIAGNOSIS — R519 Headache, unspecified: Secondary | ICD-10-CM

## 2018-10-21 DIAGNOSIS — Z7982 Long term (current) use of aspirin: Secondary | ICD-10-CM | POA: Diagnosis not present

## 2018-10-21 DIAGNOSIS — R112 Nausea with vomiting, unspecified: Secondary | ICD-10-CM | POA: Insufficient documentation

## 2018-10-21 DIAGNOSIS — Z79899 Other long term (current) drug therapy: Secondary | ICD-10-CM | POA: Insufficient documentation

## 2018-10-21 DIAGNOSIS — E876 Hypokalemia: Secondary | ICD-10-CM | POA: Diagnosis not present

## 2018-10-21 DIAGNOSIS — N183 Chronic kidney disease, stage 3 (moderate): Secondary | ICD-10-CM | POA: Insufficient documentation

## 2018-10-21 LAB — CBC WITH DIFFERENTIAL/PLATELET
Abs Immature Granulocytes: 0.02 10*3/uL (ref 0.00–0.07)
Basophils Absolute: 0.1 10*3/uL (ref 0.0–0.1)
Basophils Relative: 1 %
Eosinophils Absolute: 0.2 10*3/uL (ref 0.0–0.5)
Eosinophils Relative: 2 %
HCT: 35.7 % — ABNORMAL LOW (ref 36.0–46.0)
Hemoglobin: 11.8 g/dL — ABNORMAL LOW (ref 12.0–15.0)
Immature Granulocytes: 0 %
Lymphocytes Relative: 30 %
Lymphs Abs: 3.3 10*3/uL (ref 0.7–4.0)
MCH: 29.7 pg (ref 26.0–34.0)
MCHC: 33.1 g/dL (ref 30.0–36.0)
MCV: 89.9 fL (ref 80.0–100.0)
Monocytes Absolute: 0.6 10*3/uL (ref 0.1–1.0)
Monocytes Relative: 5 %
Neutro Abs: 6.9 10*3/uL (ref 1.7–7.7)
Neutrophils Relative %: 62 %
Platelets: 348 10*3/uL (ref 150–400)
RBC: 3.97 MIL/uL (ref 3.87–5.11)
RDW: 12.4 % (ref 11.5–15.5)
WBC: 11.1 10*3/uL — ABNORMAL HIGH (ref 4.0–10.5)
nRBC: 0 % (ref 0.0–0.2)

## 2018-10-21 LAB — COMPREHENSIVE METABOLIC PANEL
ALT: 13 U/L (ref 0–44)
AST: 21 U/L (ref 15–41)
Albumin: 4.2 g/dL (ref 3.5–5.0)
Alkaline Phosphatase: 65 U/L (ref 38–126)
Anion gap: 10 (ref 5–15)
BUN: 13 mg/dL (ref 8–23)
CO2: 27 mmol/L (ref 22–32)
Calcium: 9.4 mg/dL (ref 8.9–10.3)
Chloride: 103 mmol/L (ref 98–111)
Creatinine, Ser: 0.88 mg/dL (ref 0.44–1.00)
GFR calc Af Amer: >60 mL/min (ref >60)
GFR calc non Af Amer: >60 mL/min (ref >60)
Glucose, Bld: 96 mg/dL (ref 70–99)
Potassium: 3.3 mmol/L — ABNORMAL LOW (ref 3.5–5.1)
Sodium: 140 mmol/L (ref 135–145)
Total Bilirubin: 0.7 mg/dL (ref 0.3–1.2)
Total Protein: 7.5 g/dL (ref 6.5–8.1)

## 2018-10-21 LAB — TSH: TSH: 0.794 u[IU]/mL (ref 0.350–4.500)

## 2018-10-21 LAB — TROPONIN I (HIGH SENSITIVITY): Troponin I (High Sensitivity): 3 ng/L (ref <18)

## 2018-10-21 LAB — MAGNESIUM: Magnesium: 2 mg/dL (ref 1.7–2.4)

## 2018-10-21 MED ORDER — POTASSIUM CHLORIDE 20 MEQ/15ML (10%) PO SOLN
20.0000 meq | Freq: Once | ORAL | Status: AC
  Administered 2018-10-21: 20 meq via ORAL
  Filled 2018-10-21: qty 15

## 2018-10-21 MED ORDER — LACTATED RINGERS IV BOLUS
1000.0000 mL | Freq: Once | INTRAVENOUS | Status: AC
  Administered 2018-10-21: 1000 mL via INTRAVENOUS

## 2018-10-21 MED ORDER — PROCHLORPERAZINE EDISYLATE 10 MG/2ML IJ SOLN
10.0000 mg | Freq: Once | INTRAMUSCULAR | Status: AC
  Administered 2018-10-21: 10 mg via INTRAVENOUS
  Filled 2018-10-21: qty 2

## 2018-10-21 MED ORDER — DIPHENHYDRAMINE HCL 50 MG/ML IJ SOLN
25.0000 mg | Freq: Once | INTRAMUSCULAR | Status: AC
  Administered 2018-10-21: 25 mg via INTRAVENOUS
  Filled 2018-10-21: qty 1

## 2018-10-21 NOTE — ED Triage Notes (Signed)
Patient ambulatory to triage with steady gait, without difficulty or distress noted, mask in place; pt reports last night began having frontal HA 183/108 BP accomp by N/V and heart racing; st hx of same

## 2018-10-21 NOTE — ED Provider Notes (Signed)
Patient reports her headache is gone, she feels much better and would like to be discharged.  She has a ride home, not driving herself this evening.  Patient awake alert in no distress, appears well and appropriate for discharge.  Return precautions and treatment recommendations and follow-up discussed with the patient who is agreeable with the plan.    Delman Kitten, MD 10/21/18 2340

## 2018-10-21 NOTE — ED Provider Notes (Signed)
[facility] Emergency Department Provider Note   ____________________________________________   First MD Initiated Contact with Patient 10/21/18 2034     (approximate)  I have reviewed the triage vital signs and the nursing notes.   HISTORY  Chief Complaint Headache    HPI Rachael Jordan is a 63 y.o. female with history of migraines, hypertension, COPD presents to the ED complaining of headache with nausea and vomiting.  Patient reports she developed a headache yesterday evening which she describes as a bilateral throbbing.  It has been gradually worsening since onset, similar to prior headaches but more severe.  This is been associated with some photophobia as well as nausea and vomiting.  She also describes some diffuse muscle aches.  She denies any chest pain or shortness of breath, but does states she feels like her heart is been racing at times.  She denies any neck stiffness, numbness, weakness, vision changes, or speech changes.  She typically takes rizatriptan for her headaches, but has not taken any with this episode.        Past Medical History:  Diagnosis Date  . Acid reflux   . Anxiety   . Arthritis   . Bronchitis 09/2015  . Gross hematuria   . H. pylori infection   . Heart murmur   . Kidney failure   . 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 Lung abnormality    "damaged lung due to pneumonia"  . Neck pain   . Nephrolithiasis   . Pneumonia   . Pneumothorax   . Scoliosis   . Urinary frequency   . Vaginal atrophy     Patient Active Problem List   Diagnosis Date Noted  . Bunion 05/06/2018  . Metatarsalgia of right foot 05/06/2018  . Age-related osteoporosis without current pathological fracture 03/31/2018  . False positive ana 03/31/2018  . Mouth dryness 02/18/2018  . Screening for osteoporosis 02/18/2018  . 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 Lacunar  infarction ([place]) 01/06/2018  . Compression fracture of thoracic vertebra, sequela 07/12/2017  . Lumbar compression fracture, sequela 07/12/2017  . Nocturnal hypoxia 07/12/2017  . Simple chronic bronchitis ([place]) 07/12/2017  . Intractable back pain 06/05/2017  . Overweight (BMI 25.0-29.9) 04/08/2017  . Chronic pain syndrome 04/06/2017  . Sensory disturbance 01/08/2017  . Pain in the groin, right 10/28/2016  . Paresthesia of both hands 12/14/2015  . B12 deficiency 10/31/2015  . SOB (shortness of breath) 08/29/2015  . History of hematuria 07/12/2015  . Urinary frequency 07/12/2015  . Vaginal atrophy 07/12/2015  . HAV (hallux abducto valgus) 07/02/2015  . Overlapping toe 07/02/2015  . Hammertoe 07/02/2015  . LBP (low back pain) 01/17/2015  . Trochanteric bursitis of right hip 01/17/2015  . PNA (pneumonia) 12/28/2014  . DDD (degenerative disc disease), lumbar 08/15/2014  . Lumbar radicular syndrome 08/15/2014  . 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 DDD (degenerative disc disease), cervical 08/15/2014  . Bilateral occipital neuralgia 08/15/2014  . Migraine 08/15/2014  . Sacroiliac joint dysfunction 08/15/2014  . Back pain, chronic 09/23/2013  . Chronic cervical pain 09/23/2013  . Arthralgia of temporomandibular joint 08/29/2013  . Absolute anemia 08/15/2013  . Chronic kidney disease (CKD), stage III (moderate) ([place]) 08/15/2013  . Abnormal serum level of alkaline phosphatase 08/15/2013  . Blush 08/15/2013  . Hematuria, microscopic 08/15/2013  . Inflamed nasal mucosa 08/15/2013  . Big thyroid 08/15/2013  . Disease of thyroid gland 08/15/2013  . Positive H.  pylori test 08/15/2013  . Gastrointestinal ulcer due to Helicobacter pylori 02/77/4128  . Other specified bacterial intestinal infections 08/14/2013  . Kidney failure 08/09/2013  . SIRS (systemic inflammatory response syndrome) ([place]) 08/09/2013  . Systemic inflammatory response syndrome  (SIRS) ([place]) 08/09/2013    Past Surgical History:  Procedure Laterality Date  . ABDOMINAL HYSTERECTOMY     vaginal  . APPENDECTOMY    . FOOT SURGERY Left    10/2015  . NECK SURGERY      Prior to Admission medications   Medication Sig Start Date End Date Taking? Authorizing Provider  aspirin EC 81 MG tablet Take by mouth.    [provider]  atorvastatin (LIPITOR) 20 MG tablet Take by mouth. 01/01/18 01/01/19  [provider]  azelastine (ASTELIN) 0.1 % nasal spray Place 1 spray into the nose 2 (two) times daily.  07/05/15 09/29/17  [provider]  Biotin 10000 MCG TABS Take 1 tablet by mouth daily.    [provider]  Calcium Carbonate-Vitamin D3 600-400 MG-UNIT TABS Take 1 tablet by mouth daily.     [provider]  celecoxib (CELEBREX) 200 MG capsule Take 200 mg by mouth 2 (two) times daily.    [provider]  cetirizine (ZYRTEC) 10 MG tablet Take 10 mg by mouth at bedtime. Reported on 08/01/2015    [provider]  Cholecalciferol (VITAMIN D-1000 MAX ST) 25 MCG (1000 UT) tablet Take by mouth.    [provider]  diazepam (VALIUM) 10 MG tablet Take 30 minutes prior to the cystoscopy Patient not taking: Reported on 09/04/2018 09/29/17   Zara Council A, PA-C  diclofenac sodium (VOLTAREN) 1 % GEL Apply 2-4 g topically 4 (four) times daily.    [provider]  docusate sodium (COLACE) 100 MG capsule Take 100 mg by mouth daily as needed for mild constipation.     [provider]  erythromycin ophthalmic ointment Place 1 application into both eyes at bedtime. 10/21/17   Fisher, Linden Dolin, PA-C  gabapentin (NEURONTIN) 800 MG tablet Take by mouth. 02/20/17 02/20/18  [provider]  ipratropium-albuterol (DUONEB) 0.5-2.5 (3) MG/3ML SOLN Take 3 mLs by nebulization every 4 (four) hours as needed. Patient not taking: Reported on 09/04/2018 01/13/15   Sherrie George B, FNP  ketorolac (TORADOL) 10 MG  tablet Take 1 tablet (10 mg total) by mouth every 6 (six) hours as needed for moderate pain. Patient not taking: Reported on 09/04/2018 12/29/17   Carrie Mew, MD  linaclotide Rolan Lipa) 72 MCG capsule Take by mouth. 06/13/17 06/14/18  [provider]  meclizine (ANTIVERT) 25 MG tablet Take 1 tablet (25 mg total) by mouth 3 (three) times daily as needed for dizziness or nausea. Patient not taking: Reported on 09/04/2018 12/29/17   Carrie Mew, MD  metoCLOPramide (REGLAN) 10 MG tablet Take 1 tablet (10 mg total) by mouth every 6 (six) hours as needed. 12/29/17   Carrie Mew, MD  mirabegron ER (MYRBETRIQ) 25 MG TB24 tablet Take 1 tablet (25 mg total) by mouth daily. 09/29/17   Zara Council A, PA-C  montelukast (SINGULAIR) 10 MG tablet Take 10 mg by mouth daily.    [provider]  Multiple Vitamins-Minerals (CENTRUM ADULTS PO) Take by mouth. Reported on 09/28/2015    [provider]  nabumetone (RELAFEN) 750 MG tablet Take by mouth. 04/20/18 04/20/19  [provider]  olmesartan (BENICAR) 5 MG tablet Take by mouth.    [provider]  olopatadine (PATANOL) 0.1 %  ophthalmic solution Place 1 drop into both eyes 2 (two) times daily. 10/21/17   Fisher, Linden Dolin, PA-C  Oxycodone HCl 10 MG TABS Take 5-10 mg by mouth See admin instructions. Take 5-10 mg 3-5 times daily if tolerated    [provider]  oxyCODONE-acetaminophen (PERCOCET/ROXICET) 5-325 MG tablet Take by mouth.    [provider]  phentermine 15 MG capsule Take 15 mg by mouth daily. Reported on 08/07/2015    [provider]  promethazine (PHENERGAN) 25 MG tablet Take 25 mg by mouth every 6 (six) hours as needed for nausea or vomiting.    [provider]  rizatriptan (MAXALT) 10 MG tablet Take 10 mg by mouth as needed for migraine. May repeat in 2 hours if needed    [provider]  scopolamine (TRANSDERM-SCOP, 1.5 MG,) 1 MG/3DAYS Place 1 patch (1.5  mg total) onto the skin every 3 (three) days. 12/29/17 12/29/18  Carrie Mew, MD  tiZANidine (ZANAFLEX) 4 MG tablet Take 4 mg by mouth 4 (four) times daily.     [provider]  traZODone (DESYREL) 100 MG tablet Take 100 mg by mouth at bedtime.    [provider]  triamcinolone cream (KENALOG) 0.1 % Apply topically. 12/12/17 12/12/18  [provider]  vitamin C (ASCORBIC ACID) 500 MG tablet Take 500 mg by mouth daily.    [provider]    Allergies Grapefruit bioflavonoid complex, Grapefruit extract, Propoxyphene, and Prednisolone  Family History  Problem Relation Age of Onset  . Arthritis Mother   . Asthma Mother   . Hyperlipidemia Mother   . Hypertension Mother   . Diabetes Sister   . Cancer Maternal Aunt        esophagus  . Breast cancer Neg Hx   . Kidney cancer Neg Hx   . Bladder Cancer Neg Hx     Social History Social History   Tobacco Use  . Smoking status: Never Smoker  . Smokeless tobacco: Never Used  Substance Use Topics  . Alcohol use: No    Alcohol/week: 0.0 standard drinks  . Drug use: No    Review of Systems  Constitutional: No fever/chills Eyes: No visual changes. ENT: No sore throat. Cardiovascular: Denies chest pain. Respiratory: Denies shortness of breath. Gastrointestinal: No abdominal pain.  Positive for nausea and vomiting.  No diarrhea.  No constipation. Genitourinary: Negative for dysuria. Musculoskeletal: Negative for back pain.  Positive for myalgias. Skin: Negative for rash. Neurological: Positive for headaches, negative for focal weakness or numbness.  ____________________________________________   PHYSICAL EXAM:  VITAL SIGNS: ED Triage Vitals  Enc Vitals Group     BP 10/21/18 1908 (!) 137/110     Pulse Rate 10/21/18 1908 (!) 123     Resp 10/21/18 1908 18     Temp 10/21/18 1908 99.5 F (37.5 C)     Temp Source 10/21/18 1908 Oral     SpO2 10/21/18 1908 99 %     Weight 10/21/18 1906 148 lb  (67.1 kg)     Height 10/21/18 1906 5' 5"  (1.651 m)     Head Circumference --      Peak Flow --      Pain Score 10/21/18 1905 9     Pain Loc --      Pain Edu? --      Excl. in [place]? --     Constitutional: Alert and oriented. Eyes: Conjunctivae are normal. Head: Atraumatic. Nose: No congestion/rhinnorhea. Mouth/Throat: Mucous membranes are moist. Neck:  Normal ROM Cardiovascular: Normal rate, regular rhythm. Grossly normal heart sounds. Respiratory: Normal respiratory effort.  No retractions. Lungs CTAB. Gastrointestinal: Soft and nontender. No distention. Genitourinary: deferred Musculoskeletal: No lower extremity tenderness nor edema. Neurologic:  Normal speech and language. No gross focal neurologic deficits are appreciated.  5 out of 5 strength in bilateral upper and lower extremities, no pronator drift.  Sensation intact throughout.  Cranial nerves II through XII grossly intact. Skin:  Skin is warm, dry and intact. No rash noted. Psychiatric: Mood and affect are normal. Speech and behavior are normal.  ____________________________________________   LABS (all labs ordered are listed, but only abnormal results are displayed)  Labs Reviewed  CBC WITH DIFFERENTIAL/PLATELET - Abnormal; Notable for the following components:      Result Value   WBC 11.1 (*)    Hemoglobin 11.8 (*)    HCT 35.7 (*)    All other components within normal limits  COMPREHENSIVE METABOLIC PANEL - Abnormal; Notable for the following components:   Potassium 3.3 (*)    All other components within normal limits  MAGNESIUM  TSH  TROPONIN I (HIGH SENSITIVITY)   ____________________________________________  EKG  ED ECG REPORT I, Blake Divine, the attending physician, personally viewed and interpreted this ECG.   Date: 10/21/2018  EKG Time: 19:10  Rate: 115  Rhythm: sinus tachycardia  Axis: Normal  Intervals:none and first-degree A-V block   ST&T Change: None    PROCEDURES  Procedure(s)  performed (including Critical Care):  Procedures   ____________________________________________   INITIAL IMPRESSION / ASSESSMENT AND PLAN / ED COURSE       63 year old female with history of migraines presenting to the ED with gradually worsening headache since last night associated with nausea, vomiting, and muscle aches.  Differential includes SAH, meningitis, tension headache, migraine headache, viral illness.  Patient nontoxic-appearing and in no acute distress, vital stable and grossly normal.  She is neurologically intact with no focal deficits on exam, given gradual onset of headache, doubt SAH.  Doubt meningitis as she is afebrile without any meningismus.  Lab work unremarkable outside of mild hypokalemia.  Will treat with migraine cocktail and reevaluate.  Patient turned over to Dr. Jacqualine Code pending reevaluation.      ____________________________________________   FINAL CLINICAL IMPRESSION(S) / ED DIAGNOSES  Final diagnoses:  Acute nonintractable headache, unspecified headache type  Non-intractable vomiting with nausea, unspecified vomiting type  Hypokalemia     ED Discharge Orders    None       Note:  This document was prepared using Dragon voice recognition software and may include unintentional dictation errors.   Blake Divine, MD 10/21/18 2115

## 2018-10-23 ENCOUNTER — Ambulatory Visit: Payer: PRIVATE HEALTH INSURANCE

## 2018-10-27 NOTE — Progress Notes (Signed)
10/28/2018 1:54 PM   Rachael Jordan 1955/12/25 [id number]  Referring provider: Sharyne Peach, MD [place],  [place] [address]  Chief Complaint  Patient presents with  . Hematuria  . Urinary Urgency    HPI: Rachael Jordan is a 62 year old female with a history of gross hematuria, urinary frequency and vaginal atrophy who presents today for further evaluation.  History of hematuria (high risk) Non-smoker.  Hematuria work up in 06/2014 was negative.  CTU in 10/2017 revealed normal adrenal glands. The kidneys are both unremarkable. No kidney stones identified. No hydronephrosis or hydroureter. No kidney mass identified. No suspicious filling defects identified within the collecting systems, ureters or urinary bladder.  Cystoscopy in 10/2017 with Dr. Bernardo Heater was NED.  She reported an episode of gross hematuria in June 2020.  Her UA was negative for AMH.    Urinary frequency Myrbetriq was cost prohibitive.  Discontinued PTNS treatments.  The patient is  experiencing urgency x 0-3 (improved), frequency x 4-7 (unchanged), is restricting fluids to avoid visits to the restroom, is engaging in toilet mapping, incontinence x 0-3 (unchanged) and nocturia x 4-7 (worsened).   Her BP is 155/82.   Her PVR is 129 mL.  Her PCP had placed her on Sanctura for the frequency.   She is having the pressure to urinate to months.  She states she feels the urge and when she goes to sit on the toilet and nothing comes out.     PMH: Past Medical History:  Diagnosis Date  . Acid reflux   . Anxiety   . Arthritis   . Bronchitis 09/2015  . Gross hematuria   . H. pylori infection   . Heart murmur   . Kidney failure   . 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 Lung abnormality    "damaged lung due to pneumonia"  . Neck pain   . Nephrolithiasis   . Pneumonia   . Pneumothorax   . Scoliosis   . Urinary frequency   . Vaginal atrophy     Surgical History: Past Surgical History:   Procedure Laterality Date  . ABDOMINAL HYSTERECTOMY     vaginal  . APPENDECTOMY    . FOOT SURGERY Left    10/2015  . NECK SURGERY      Home Medications:  Allergies as of 10/28/2018      Reactions   Grapefruit Bioflavonoid Complex Rash   Grapefruit Extract Rash   Propoxyphene Nausea And Vomiting, Nausea Only   Prednisolone    Psychotic features      Medication List       Accurate as of October 28, 2018 11:59 PM. If you have any questions, ask your nurse or doctor.        aspirin EC 81 MG tablet Take by mouth.   atorvastatin 20 MG tablet Commonly known as: LIPITOR Take by mouth.   azelastine 0.1 % nasal spray Commonly known as: ASTELIN Place 1 spray into the nose 2 (two) times daily.   Biotin 10000 MCG Tabs Take 1 tablet by mouth daily.   Calcium Carbonate-Vitamin D3 600-400 MG-UNIT Tabs Take 1 tablet by mouth daily.   celecoxib 200 MG capsule Commonly known as: CELEBREX Take 200 mg by mouth 2 (two) times daily.   CENTRUM ADULTS PO Take by mouth. Reported on 09/28/2015   cetirizine 10 MG tablet Commonly known as: ZYRTEC Take 10 mg by mouth at bedtime. Reported on 08/01/2015   diazepam 10 MG tablet Commonly  known as: Valium Take 30 minutes prior to the cystoscopy   diclofenac sodium 1 % Gel Commonly known as: VOLTAREN Apply 2-4 g topically 4 (four) times daily.   docusate sodium 100 MG capsule Commonly known as: COLACE Take 100 mg by mouth daily as needed for mild constipation.   DULoxetine 30 MG capsule Commonly known as: CYMBALTA   erythromycin ophthalmic ointment Place 1 application into both eyes at bedtime.   gabapentin 800 MG tablet Commonly known as: NEURONTIN Take by mouth.   hydrocortisone 2.5 % ointment Apply topically.   ipratropium-albuterol 0.5-2.5 (3) MG/3ML Soln Commonly known as: DUONEB Take 3 mLs by nebulization every 4 (four) hours as needed.   ketorolac 10 MG tablet Commonly known as: TORADOL Take 1 tablet (10 mg total)  by mouth every 6 (six) hours as needed for moderate pain.   Linzess 72 MCG capsule Generic drug: linaclotide Take by mouth.   meclizine 25 MG tablet Commonly known as: ANTIVERT Take 1 tablet (25 mg total) by mouth 3 (three) times daily as needed for dizziness or nausea.   metoCLOPramide 10 MG tablet Commonly known as: REGLAN Take 1 tablet (10 mg total) by mouth every 6 (six) hours as needed.   mirabegron ER 25 MG Tb24 tablet Commonly known as: MYRBETRIQ Take 1 tablet (25 mg total) by mouth daily.   montelukast 10 MG tablet Commonly known as: SINGULAIR Take 10 mg by mouth daily.   nabumetone 750 MG tablet Commonly known as: RELAFEN Take by mouth.   olmesartan 5 MG tablet Commonly known as: BENICAR Take by mouth.   olopatadine 0.1 % ophthalmic solution Commonly known as: PATANOL Place 1 drop into both eyes 2 (two) times daily.   Oxycodone HCl 10 MG Tabs Take 5-10 mg by mouth See admin instructions. Take 5-10 mg 3-5 times daily if tolerated   oxyCODONE-acetaminophen 5-325 MG tablet Commonly known as: PERCOCET/ROXICET Take by mouth.   phentermine 15 MG capsule Take 15 mg by mouth daily. Reported on 08/07/2015   promethazine 25 MG tablet Commonly known as: PHENERGAN Take 25 mg by mouth every 6 (six) hours as needed for nausea or vomiting.   rizatriptan 10 MG tablet Commonly known as: MAXALT Take 10 mg by mouth as needed for migraine. May repeat in 2 hours if needed   scopolamine 1 MG/3DAYS Commonly known as: Transderm-Scop (1.5 MG) Place 1 patch (1.5 mg total) onto the skin every 3 (three) days.   tiZANidine 4 MG tablet Commonly known as: ZANAFLEX Take 4 mg by mouth 4 (four) times daily.   traZODone 100 MG tablet Commonly known as: DESYREL Take 100 mg by mouth at bedtime.   triamcinolone cream 0.1 % Commonly known as: KENALOG Apply topically.   trospium 20 MG tablet Commonly known as: SANCTURA Take by mouth.   valACYclovir 1000 MG tablet Commonly  known as: VALTREX Take by mouth.   venlafaxine XR 37.5 MG 24 hr capsule Commonly known as: EFFEXOR-XR Take by mouth.   vitamin C 500 MG tablet Commonly known as: ASCORBIC ACID Take 500 mg by mouth daily.   Vitamin D-1000 Max St 25 MCG (1000 UT) tablet Generic drug: Cholecalciferol Take by mouth.       Allergies:  Allergies  Allergen Reactions  . Grapefruit Bioflavonoid Complex Rash  . Grapefruit Extract Rash  . Propoxyphene Nausea And Vomiting and Nausea Only  . Prednisolone     Psychotic features    Family History: Family History  Problem Relation Age of Onset  .  Arthritis Mother   . Asthma Mother   . Hyperlipidemia Mother   . Hypertension Mother   . Diabetes Sister   . Cancer Maternal Aunt        esophagus  . Breast cancer Neg Hx   . Kidney cancer Neg Hx   . Bladder Cancer Neg Hx     Social History:  reports that she has never smoked. She has never used smokeless tobacco. She reports that she does not drink alcohol or use drugs.  ROS: UROLOGY Frequent Urination?: Yes Hard to postpone urination?: Yes Burning/pain with urination?: No Get up at night to urinate?: Yes Leakage of urine?: No Urine stream starts and stops?: No Trouble starting stream?: Yes Do you have to strain to urinate?: Yes Blood in urine?: No Urinary tract infection?: No Sexually transmitted disease?: No Injury to kidneys or bladder?: No Painful intercourse?: No Weak stream?: No Currently pregnant?: No Vaginal bleeding?: No Last menstrual period?: N/A  Gastrointestinal Nausea?: No Vomiting?: No Indigestion/heartburn?: No Diarrhea?: No Constipation?: No  Constitutional Fever: No Night sweats?: No Weight loss?: No Fatigue?: No  Skin Skin rash/lesions?: No Itching?: No  Eyes Blurred vision?: No Double vision?: No  Ears/Nose/Throat Sore throat?: No Sinus problems?: No  Hematologic/Lymphatic Swollen glands?: No Easy bruising?: No  Cardiovascular Leg swelling?:  No Chest pain?: No  Respiratory Cough?: No Shortness of breath?: No  Endocrine Excessive thirst?: No  Musculoskeletal Back pain?: Yes Joint pain?: Yes  Neurological Headaches?: No Dizziness?: No  Psychologic Depression?: No Anxiety?: No  Physical Exam: BP (!) 155/82 (BP Location: Left Arm, Patient Position: Sitting, Cuff Size: Normal)   Pulse (!) 57   Ht 5' 3"  (1.6 m)   Wt 150 lb (68 kg)   BMI 26.57 kg/m   Constitutional:  Well nourished. Alert and oriented, No acute distress. HEENT: [place] AT, moist mucus membranes.  Trachea midline, no masses. Cardiovascular: No clubbing, cyanosis, or edema. Respiratory: Normal respiratory effort, no increased work of breathing. GI: Abdomen is soft, non tender, non distended, no abdominal masses. Liver and spleen not palpable.  No hernias appreciated.  Stool sample for occult testing is not indicated.   GU: No CVA tenderness.  No bladder fullness or masses.  Atrophic external genitalia, sparse pubic hair distribution, no lesions.  Normal urethral meatus, no lesions, no prolapse, no discharge.   No urethral masses, tenderness and/or tenderness. No bladder fullness, tenderness or masses. pink vagina mucosa, fair estrogen effect, no discharge, no lesions, good pelvic support, no cystocele and no rectocele noted.  Cervix and uterus are surgically absent.  No adnexal/parametria masses or tenderness noted.  Anus and perineum are without rashes or lesions.    Skin: No rashes, bruises or suspicious lesions. Lymph: No inguinal adenopathy. Neurologic: Grossly intact, no focal deficits, moving all 4 extremities. Psychiatric: Normal mood and affect.   Laboratory Data: Lab Results  Component Value Date   WBC 8.1 11/02/2018   HGB 11.6 (L) 11/02/2018   HCT 36.0 11/02/2018   MCV 92.3 11/02/2018   PLT 349 11/02/2018    Lab Results  Component Value Date   CREATININE 0.93 11/02/2018    No results found for: PSA  No results found for: TESTOSTERONE   No results found for: HGBA1C  Lab Results  Component Value Date   TSH 0.794 10/21/2018    No results found for: CHOL, HDL, CHOLHDL, VLDL, LDLCALC  Lab Results  Component Value Date   AST 21 10/21/2018   Lab Results  Component Value Date  ALT 13 10/21/2018   No components found for: ALKALINEPHOPHATASE No components found for: BILIRUBINTOTAL  No results found for: ESTRADIOL  Urinalysis Component     Latest Ref Rng & Units 10/28/2018  Specific Gravity, UA     1.005 - 1.030 <1.005 (L)  pH, UA     5.0 - 7.5 6.0  Color, UA     Yellow Yellow  Appearance Ur     Clear Clear  Leukocytes,UA     Negative Negative  Protein,UA     Negative/Trace Negative  Glucose, UA     Negative Negative  Ketones, UA     Negative Negative  RBC, UA     Negative Trace (A)  Bilirubin, UA     Negative Negative  Urobilinogen, Ur     0.2 - 1.0 mg/dL 0.2  Nitrite, UA     Negative Negative  Microscopic Examination      See below:   Component     Latest Ref Rng & Units 10/28/2018  Bacteria, UA     None seen/Few None seen   I have reviewed the labs.   Pertinent Imaging: Results for ANNASTYN, SILVEY (MRN [id number]) as of 11/05/2018 16:08  Ref. Range 10/28/2018 13:52  Scan Result Unknown 171m   Assessment & Plan:    1. Gross hematuria (high risk) No hematuria seen on today exam Will recheck UA upon return   2. History of hematuria Hematuria work up completed in 10/2017 - findings positive for NED Report of gross hematuria in June 2020 UA today negative Will recheck UA in one month    3. Feelings of incomplete bladder emptying Patient will discontinue the Sanctura at this time Will reassess in one month with OAB questionnaire and PVR     Return in about 1 month (around 11/28/2018) for PVR and OAB questionnaire.  These notes generated with voice recognition software. I apologize for typographical errors.  SZara Council PA-C  [facility] [address] [place] [phone]

## 2018-10-28 ENCOUNTER — Ambulatory Visit (INDEPENDENT_AMBULATORY_CARE_PROVIDER_SITE_OTHER): Payer: PRIVATE HEALTH INSURANCE | Admitting: Urology

## 2018-10-28 ENCOUNTER — Encounter: Payer: Self-pay | Admitting: Urology

## 2018-10-28 ENCOUNTER — Other Ambulatory Visit: Payer: Self-pay

## 2018-10-28 VITALS — BP 155/82 | HR 57 | Ht 63.0 in | Wt 150.0 lb

## 2018-10-28 DIAGNOSIS — R350 Frequency of micturition: Secondary | ICD-10-CM | POA: Diagnosis not present

## 2018-10-28 DIAGNOSIS — R310 Gross hematuria: Secondary | ICD-10-CM | POA: Diagnosis not present

## 2018-10-28 DIAGNOSIS — Z87448 Personal history of other diseases of urinary system: Secondary | ICD-10-CM

## 2018-10-28 LAB — BLADDER SCAN AMB NON-IMAGING

## 2018-10-29 LAB — MICROSCOPIC EXAMINATION
Bacteria, UA: NONE SEEN
Epithelial Cells (non renal): NONE SEEN /hpf (ref 0–10)
RBC, Urine: NONE SEEN /hpf (ref 0–2)

## 2018-10-29 LAB — URINALYSIS, COMPLETE
Bilirubin, UA: NEGATIVE
Glucose, UA: NEGATIVE
Ketones, UA: NEGATIVE
Leukocytes,UA: NEGATIVE
Nitrite, UA: NEGATIVE
Protein,UA: NEGATIVE
Specific Gravity, UA: <1.005 — ABNORMAL LOW (ref 1.005–1.030)
Urobilinogen, Ur: 0.2 mg/dL (ref 0.2–1.0)
pH, UA: 6 (ref 5.0–7.5)

## 2018-10-30 ENCOUNTER — Ambulatory Visit: Payer: PRIVATE HEALTH INSURANCE

## 2018-11-02 ENCOUNTER — Emergency Department: Payer: PRIVATE HEALTH INSURANCE

## 2018-11-02 ENCOUNTER — Emergency Department
Admission: EM | Admit: 2018-11-02 | Discharge: 2018-11-02 | Disposition: A | Discharge status: Home / Self Care | Payer: PRIVATE HEALTH INSURANCE | Attending: Emergency Medicine | Admitting: Emergency Medicine

## 2018-11-02 ENCOUNTER — Encounter: Payer: Self-pay | Admitting: Intensive Care

## 2018-11-02 ENCOUNTER — Other Ambulatory Visit: Payer: Self-pay

## 2018-11-02 DIAGNOSIS — R0789 Other chest pain: Secondary | ICD-10-CM | POA: Insufficient documentation

## 2018-11-02 DIAGNOSIS — R42 Dizziness and giddiness: Secondary | ICD-10-CM | POA: Diagnosis present

## 2018-11-02 DIAGNOSIS — I10 Essential (primary) hypertension: Secondary | ICD-10-CM

## 2018-11-02 DIAGNOSIS — Z79899 Other long term (current) drug therapy: Secondary | ICD-10-CM | POA: Insufficient documentation

## 2018-11-02 DIAGNOSIS — Z91018 Allergy to other foods: Secondary | ICD-10-CM | POA: Insufficient documentation

## 2018-11-02 DIAGNOSIS — Z7982 Long term (current) use of aspirin: Secondary | ICD-10-CM | POA: Insufficient documentation

## 2018-11-02 DIAGNOSIS — N183 Chronic kidney disease, stage 3 (moderate): Secondary | ICD-10-CM | POA: Insufficient documentation

## 2018-11-02 DIAGNOSIS — M546 Pain in thoracic spine: Secondary | ICD-10-CM | POA: Diagnosis not present

## 2018-11-02 DIAGNOSIS — G8929 Other chronic pain: Secondary | ICD-10-CM

## 2018-11-02 DIAGNOSIS — I129 Hypertensive chronic kidney disease with stage 1 through stage 4 chronic kidney disease, or unspecified chronic kidney disease: Secondary | ICD-10-CM | POA: Insufficient documentation

## 2018-11-02 DIAGNOSIS — Z880 Allergy status to penicillin: Secondary | ICD-10-CM | POA: Diagnosis not present

## 2018-11-02 DIAGNOSIS — R011 Cardiac murmur, unspecified: Secondary | ICD-10-CM | POA: Insufficient documentation

## 2018-11-02 LAB — BASIC METABOLIC PANEL
Anion gap: 7 (ref 5–15)
BUN: 7 mg/dL — ABNORMAL LOW (ref 8–23)
CO2: 30 mmol/L (ref 22–32)
Calcium: 9.3 mg/dL (ref 8.9–10.3)
Chloride: 102 mmol/L (ref 98–111)
Creatinine, Ser: 0.93 mg/dL (ref 0.44–1.00)
GFR calc Af Amer: >60 mL/min (ref >60)
GFR calc non Af Amer: >60 mL/min (ref >60)
Glucose, Bld: 93 mg/dL (ref 70–99)
Potassium: 3.3 mmol/L — ABNORMAL LOW (ref 3.5–5.1)
Sodium: 139 mmol/L (ref 135–145)

## 2018-11-02 LAB — CBC
HCT: 36 % (ref 36.0–46.0)
Hemoglobin: 11.6 g/dL — ABNORMAL LOW (ref 12.0–15.0)
MCH: 29.7 pg (ref 26.0–34.0)
MCHC: 32.2 g/dL (ref 30.0–36.0)
MCV: 92.3 fL (ref 80.0–100.0)
Platelets: 349 10*3/uL (ref 150–400)
RBC: 3.9 MIL/uL (ref 3.87–5.11)
RDW: 12.2 % (ref 11.5–15.5)
WBC: 8.1 10*3/uL (ref 4.0–10.5)
nRBC: 0 % (ref 0.0–0.2)

## 2018-11-02 LAB — TROPONIN I (HIGH SENSITIVITY): Troponin I (High Sensitivity): <2 ng/L (ref <18)

## 2018-11-02 MED ORDER — MORPHINE SULFATE (PF) 4 MG/ML IV SOLN
4.0000 mg | Freq: Once | INTRAVENOUS | Status: AC
  Administered 2018-11-02: 4 mg via INTRAVENOUS
  Filled 2018-11-02: qty 1

## 2018-11-02 MED ORDER — LACTATED RINGERS IV BOLUS
1000.0000 mL | Freq: Once | INTRAVENOUS | Status: AC
  Administered 2018-11-02: 19:00:00 1000 mL via INTRAVENOUS

## 2018-11-02 NOTE — ED Triage Notes (Addendum)
Patient reports she is here after calling her MD and telling them her b/p was high, they sent her here. Patient c/o headache today, HTN, and dizziness over weekend through today. Also reports neck pain, cp that caused sob yesterday. Was recently seen for same

## 2018-11-02 NOTE — ED Notes (Signed)
Pt stating severe pain in lower back that radiates up to chest. Pt states she also has pain in legs. Pt ambulatory to ED stretcher at this time with steady gait. Respirations even and unlabored. Pt in NAD at this time.

## 2018-11-02 NOTE — ED Provider Notes (Signed)
[facility] Emergency Department Provider Note   ____________________________________________   First MD Initiated Contact with Patient 11/02/18 1735     (approximate)  I have reviewed the triage vital signs and the nursing notes.   HISTORY  Chief Complaint Dizziness and Chest Pain    HPI Rachael Jordan is a 63 y.o. female with past medical history of hypertension who presents to the ED complaining of back pain and elevated blood pressure.  Patient reports that she has been dealing with a couple of days of pain radiating down the entirety of her back.  She describes it as sharp and moving from her neck all the way to her lower back.  She states this is similar to pain she has had in the past and follows with pain management for.  She spoke to her PCP about this problem and mentioned that her blood pressure has been elevated.  Her PCP then recommended she be evaluated in the ED.  She currently denies any chest pain, shortness of breath, fever, or cough.  She denies any pain or swelling in her legs.        Past Medical History:  Diagnosis Date  . Acid reflux   . Anxiety   . Arthritis   . Bronchitis 09/2015  . Gross hematuria   . H. pylori infection   . Heart murmur   . Kidney failure   . 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 Lung abnormality    "damaged lung due to pneumonia"  . Neck pain   . Nephrolithiasis   . Pneumonia   . Pneumothorax   . Scoliosis   . Urinary frequency   . Vaginal atrophy     Patient Active Problem List   Diagnosis Date Noted  . Bunion 05/06/2018  . Metatarsalgia of right foot 05/06/2018  . Age-related osteoporosis without current pathological fracture 03/31/2018  . False positive ana 03/31/2018  . Mouth dryness 02/18/2018  . Screening for osteoporosis 02/18/2018  . 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 Lacunar infarction ([place]) 01/06/2018  .  Compression fracture of thoracic vertebra, sequela 07/12/2017  . Lumbar compression fracture, sequela 07/12/2017  . Nocturnal hypoxia 07/12/2017  . Simple chronic bronchitis ([place]) 07/12/2017  . Intractable back pain 06/05/2017  . Overweight (BMI 25.0-29.9) 04/08/2017  . Chronic pain syndrome 04/06/2017  . Sensory disturbance 01/08/2017  . Pain in the groin, right 10/28/2016  . Paresthesia of both hands 12/14/2015  . B12 deficiency 10/31/2015  . SOB (shortness of breath) 08/29/2015  . History of hematuria 07/12/2015  . Urinary frequency 07/12/2015  . Vaginal atrophy 07/12/2015  . HAV (hallux abducto valgus) 07/02/2015  . Overlapping toe 07/02/2015  . Hammertoe 07/02/2015  . LBP (low back pain) 01/17/2015  . Trochanteric bursitis of right hip 01/17/2015  . PNA (pneumonia) 12/28/2014  . DDD (degenerative disc disease), lumbar 08/15/2014  . Lumbar radicular syndrome 08/15/2014  . 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 DDD (degenerative disc disease), cervical 08/15/2014  . Bilateral occipital neuralgia 08/15/2014  . Migraine 08/15/2014  . Sacroiliac joint dysfunction 08/15/2014  . Back pain, chronic 09/23/2013  . Chronic cervical pain 09/23/2013  . Arthralgia of temporomandibular joint 08/29/2013  . Absolute anemia 08/15/2013  . Chronic kidney disease (CKD), stage III (moderate) ([place]) 08/15/2013  . Abnormal serum level of alkaline phosphatase 08/15/2013  . Blush 08/15/2013  . Hematuria, microscopic 08/15/2013  . Inflamed nasal mucosa 08/15/2013  . Big thyroid  08/15/2013  . Disease of thyroid gland 08/15/2013  . Positive H. pylori test 08/15/2013  . Gastrointestinal ulcer due to Helicobacter pylori 19/41/7408  . Other specified bacterial intestinal infections 08/14/2013  . Kidney failure 08/09/2013  . SIRS (systemic inflammatory response syndrome) ([place]) 08/09/2013  . Systemic inflammatory response syndrome (SIRS) ([place]) 08/09/2013    Past  Surgical History:  Procedure Laterality Date  . ABDOMINAL HYSTERECTOMY     vaginal  . APPENDECTOMY    . FOOT SURGERY Left    10/2015  . NECK SURGERY      Prior to Admission medications   Medication Sig Start Date End Date Taking? Authorizing Provider  aspirin EC 81 MG tablet Take by mouth.    [provider]  atorvastatin (LIPITOR) 20 MG tablet Take by mouth. 01/01/18 01/01/19  [provider]  azelastine (ASTELIN) 0.1 % nasal spray Place 1 spray into the nose 2 (two) times daily.  07/05/15 09/29/17  [provider]  Biotin 10000 MCG TABS Take 1 tablet by mouth daily.    [provider]  Calcium Carbonate-Vitamin D3 600-400 MG-UNIT TABS Take 1 tablet by mouth daily.     [provider]  celecoxib (CELEBREX) 200 MG capsule Take 200 mg by mouth 2 (two) times daily.    [provider]  cetirizine (ZYRTEC) 10 MG tablet Take 10 mg by mouth at bedtime. Reported on 08/01/2015    [provider]  Cholecalciferol (VITAMIN D-1000 MAX ST) 25 MCG (1000 UT) tablet Take by mouth.    [provider]  diazepam (VALIUM) 10 MG tablet Take 30 minutes prior to the cystoscopy Patient not taking: Reported on 09/04/2018 09/29/17   Zara Council A, PA-C  diclofenac sodium (VOLTAREN) 1 % GEL Apply 2-4 g topically 4 (four) times daily.    [provider]  docusate sodium (COLACE) 100 MG capsule Take 100 mg by mouth daily as needed for mild constipation.     [provider]  DULoxetine (CYMBALTA) 30 MG capsule  09/01/18   [provider]  erythromycin ophthalmic ointment Place 1 application into both eyes at bedtime. 10/21/17   [place], Linden Dolin, PA-C  gabapentin (NEURONTIN) 800 MG tablet Take by mouth. 02/20/17 02/20/18  [provider]  hydrocortisone 2.5 % ointment Apply topically. 09/29/18 09/29/19  [provider]  ipratropium-albuterol (DUONEB) 0.5-2.5 (3) MG/3ML SOLN Take 3 mLs by nebulization every 4  (four) hours as needed. 01/13/15   Triplett, Johnette Abraham B, FNP  ketorolac (TORADOL) 10 MG tablet Take 1 tablet (10 mg total) by mouth every 6 (six) hours as needed for moderate pain. 12/29/17   Carrie Mew, MD  linaclotide Rolan Lipa) 72 MCG capsule Take by mouth. 06/13/17 06/14/18  [provider]  meclizine (ANTIVERT) 25 MG tablet Take 1 tablet (25 mg total) by mouth 3 (three) times daily as needed for dizziness or nausea. 12/29/17   Carrie Mew, MD  metoCLOPramide (REGLAN) 10 MG tablet Take 1 tablet (10 mg total) by mouth every 6 (six) hours as needed. 12/29/17   Carrie Mew, MD  mirabegron ER (MYRBETRIQ) 25 MG TB24 tablet Take 1 tablet (25 mg total) by mouth daily. 09/29/17   Zara Council A, PA-C  montelukast (SINGULAIR) 10 MG tablet Take 10 mg by mouth daily.    [provider]  Multiple Vitamins-Minerals (CENTRUM ADULTS PO) Take by mouth. Reported on 09/28/2015    [provider]  nabumetone (RELAFEN) 750 MG tablet Take by mouth. 04/20/18 04/20/19  [provider]  olmesartan (BENICAR) 5 MG tablet Take by mouth.    [provider]  olopatadine (PATANOL) 0.1 % ophthalmic solution Place 1 drop into both eyes 2 (two) times daily. 10/21/17   [place], Linden Dolin, PA-C  Oxycodone HCl 10 MG TABS Take 5-10 mg by mouth See admin instructions. Take 5-10 mg 3-5 times daily if tolerated    [provider]  oxyCODONE-acetaminophen (PERCOCET/ROXICET) 5-325 MG tablet Take by mouth.    [provider]  phentermine 15 MG capsule Take 15 mg by mouth daily. Reported on 08/07/2015    [provider]  promethazine (PHENERGAN) 25 MG tablet Take 25 mg by mouth every 6 (six) hours as needed for nausea or vomiting.    [provider]  rizatriptan (MAXALT) 10 MG tablet Take 10 mg by mouth as needed for migraine. May repeat in 2 hours if needed    [provider]  scopolamine (TRANSDERM-SCOP, 1.5 MG,) 1 MG/3DAYS Place 1 patch (1.5 mg  total) onto the skin every 3 (three) days. 12/29/17 12/29/18  Carrie Mew, MD  tiZANidine (ZANAFLEX) 4 MG tablet Take 4 mg by mouth 4 (four) times daily.     [provider]  traZODone (DESYREL) 100 MG tablet Take 100 mg by mouth at bedtime.    [provider]  triamcinolone cream (KENALOG) 0.1 % Apply topically. 12/12/17 12/12/18  [provider]  trospium (SANCTURA) 20 MG tablet Take by mouth. 08/28/18 08/28/19  [provider]  valACYclovir (VALTREX) 1000 MG tablet Take by mouth. 06/30/18 04/12/19  [provider]  venlafaxine XR (EFFEXOR-XR) 37.5 MG 24 hr capsule Take by mouth. 06/19/18 06/19/19  [provider]  vitamin C (ASCORBIC ACID) 500 MG tablet Take 500 mg by mouth daily.    [provider]    Allergies Grapefruit bioflavonoid complex, Grapefruit extract, Propoxyphene, and Prednisolone  Family History  Problem Relation Age of Onset  . Arthritis Mother   . Asthma Mother   . Hyperlipidemia Mother   . Hypertension Mother   . Diabetes Sister   . Cancer Maternal Aunt        esophagus  . Breast cancer Neg Hx   . Kidney cancer Neg Hx   . Bladder Cancer Neg Hx     Social History Social History   Tobacco Use  . Smoking status: Never Smoker  . Smokeless tobacco: Never Used  Substance Use Topics  . Alcohol use: No    Alcohol/week: 0.0 standard drinks  . Drug use: No    Review of Systems  Constitutional: No fever/chills Eyes: No visual changes. ENT: No sore throat. Cardiovascular: Denies chest pain. Respiratory: Denies shortness of breath. Gastrointestinal: No abdominal pain.  No nausea, no vomiting.  No diarrhea.  No constipation. Genitourinary: Negative for dysuria. Musculoskeletal: Positive for back pain. Skin: Negative for rash. Neurological: Negative for headaches, focal weakness or numbness.  ____________________________________________   PHYSICAL EXAM:  VITAL SIGNS: ED Triage Vitals  Enc  Vitals Group     BP 11/02/18 1528 (!) 169/100     Pulse Rate 11/02/18 1528 (!) 118     Resp 11/02/18 1530 16     Temp 11/02/18 1528 99.1 F (37.3 C)     Temp Source 11/02/18 1528 Oral     SpO2 11/02/18 1528 99 %     Weight 11/02/18 1528 150 lb (68 kg)     Height 11/02/18 1528 5' 5"  (1.651 m)     Head Circumference --  Peak Flow --      Pain Score 11/02/18 1534 5     Pain Loc --      Pain Edu? --      Excl. in [place]? --     Constitutional: Alert and oriented. Eyes: Conjunctivae are normal. Head: Atraumatic. Nose: No congestion/rhinnorhea. Mouth/Throat: Mucous membranes are moist. Neck: Normal ROM Cardiovascular: Normal rate, regular rhythm. Grossly normal heart sounds.  2+ radial pulses bilaterally. Respiratory: Normal respiratory effort.  No retractions. Lungs CTAB. Gastrointestinal: Soft and nontender. No distention. Genitourinary: deferred Musculoskeletal: No lower extremity tenderness nor edema. Neurologic:  Normal speech and language. No gross focal neurologic deficits are appreciated. Skin:  Skin is warm, dry and intact. No rash noted. Psychiatric: Mood and affect are normal. Speech and behavior are normal.  ____________________________________________   LABS (all labs ordered are listed, but only abnormal results are displayed)  Labs Reviewed  BASIC METABOLIC PANEL - Abnormal; Notable for the following components:      Result Value   Potassium 3.3 (*)    BUN 7 (*)    All other components within normal limits  CBC - Abnormal; Notable for the following components:   Hemoglobin 11.6 (*)    All other components within normal limits  TROPONIN I (HIGH SENSITIVITY)   ____________________________________________  EKG  ED ECG REPORT I, Blake Divine, the attending physician, personally viewed and interpreted this ECG.   Date: 11/02/2018  EKG Time: 15:39  Rate: 110  Rhythm: sinus tachycardia  Axis: Normal  Intervals:none  ST&T Change: Nonspecific T wave  abnormality   PROCEDURES  Procedure(s) performed (including Critical Care):  Procedures   ____________________________________________   INITIAL IMPRESSION / ASSESSMENT AND PLAN / ED COURSE       63 year old female with history of hypertension presenting to the ED with acute on chronic pain moving down much of her back as well as with elevated blood pressure.  Pain appears to be chronic in nature, do not suspect ACS as EKG without ischemic changes and troponin within normal limits.  Do not feel recheck of troponin in 2 hours is indicated given patient's constant symptoms.  Chest x-ray negative for acute process.  Remainder of labs unremarkable.  No apparent hypertensive emergency, do not suspect aortic dissection given patient symptoms not consistent with this and she has equal pulses throughout.  Patient appropriate for outpatient follow-up with pain management and her PCP, patient agrees with plan.      ____________________________________________   FINAL CLINICAL IMPRESSION(S) / ED DIAGNOSES  Final diagnoses:  Chronic bilateral thoracic back pain  Hypertension, unspecified type  Dizziness     ED Discharge Orders    None       Note:  This document was prepared using Dragon voice recognition software and may include unintentional dictation errors.   Blake Divine, MD 11/02/18 2155

## 2018-11-02 NOTE — ED Notes (Signed)
Pt stating "I feel better, I would like to go home. My pain is under control". MD aware.

## 2018-11-06 ENCOUNTER — Ambulatory Visit: Payer: PRIVATE HEALTH INSURANCE

## 2018-11-09 ENCOUNTER — Other Ambulatory Visit: Payer: Self-pay

## 2018-11-09 ENCOUNTER — Encounter: Payer: Self-pay | Admitting: Internal Medicine

## 2018-11-09 ENCOUNTER — Ambulatory Visit: Payer: PRIVATE HEALTH INSURANCE | Admitting: Internal Medicine

## 2018-11-09 VITALS — BP 133/95 | HR 109 | Resp 16 | Ht 65.0 in | Wt 155.4 lb

## 2018-11-09 DIAGNOSIS — R0602 Shortness of breath: Secondary | ICD-10-CM

## 2018-11-09 DIAGNOSIS — J449 Chronic obstructive pulmonary disease, unspecified: Secondary | ICD-10-CM | POA: Diagnosis not present

## 2018-11-09 DIAGNOSIS — R0683 Snoring: Secondary | ICD-10-CM

## 2018-11-09 MED ORDER — BUDESONIDE-FORMOTEROL FUMARATE 80-4.5 MCG/ACT IN AERO: 2.0000 | INHALATION_SPRAY | Freq: Two times a day (BID) | RESPIRATORY_TRACT | 3 refills | Status: DC

## 2018-11-09 NOTE — Progress Notes (Signed)
[facility] [place], [place] [address]  Pulmonary Sleep Medicine   Office Visit Note  Patient Name: Rachael Jordan DOB: [birth year] MRN [id number]  Date of Service: 11/09/2018  Complaints/HPI: Patient is here for follow-up she has not been in the office in over a year.  States that she has noted more shortness of breath.  She states that she is having some cough and wheezing also.  Patient also has been having generalized aches and pains for which she sees primary care physician.  Reviewed her medications and right now she is on pro-air as needed SVN she is not on any inhaled steroids.  She did have a spirometry done today which showed that her FEV1 was significantly reduced compared to previous study the severity of the significance I think she would actually benefit from inhalation therapy with inhaled Labe and corticosteroid.  The other issue is that she did also have a reduction in her vital capacity and this could present restrictive component for her pulmonary issues  ROS  General: (-) fever, (-) chills, (-) night sweats, (-) weakness Skin: (-) rashes, (-) itching,. Eyes: (-) visual changes, (-) redness, (-) itching. Nose and Sinuses: (-) nasal stuffiness or itchiness, (-) postnasal drip, (-) nosebleeds, (-) sinus trouble. Mouth and Throat: (-) sore throat, (-) hoarseness. Neck: (-) swollen glands, (-) enlarged thyroid, (-) neck pain. Respiratory: + cough, (-) bloody sputum, + shortness of breath, - wheezing. Cardiovascular: - ankle swelling, (-) chest pain. Lymphatic: (-) lymph node enlargement. Neurologic: (-) numbness, (-) tingling. Psychiatric: (-) anxiety, (-) depression   Current Medication: Outpatient Encounter Medications as of 11/09/2018  Medication Sig Note  . albuterol (VENTOLIN HFA) 108 (90 Base) MCG/ACT inhaler Inhale 1 puff into the lungs every 6 (six) hours as needed for wheezing or shortness of breath.   Marland Kitchen aspirin EC 81 MG tablet Take  by mouth.   Marland Kitchen atorvastatin (LIPITOR) 20 MG tablet Take by mouth.   . Biotin 10000 MCG TABS Take 1 tablet by mouth daily.   . Calcium Carbonate-Vitamin D3 600-400 MG-UNIT TABS Take 1 tablet by mouth daily.    . celecoxib (CELEBREX) 200 MG capsule Take 200 mg by mouth 2 (two) times daily.   . cetirizine (ZYRTEC) 10 MG tablet Take 10 mg by mouth at bedtime. Reported on 08/01/2015   . Cholecalciferol (VITAMIN D-1000 MAX ST) 25 MCG (1000 UT) tablet Take by mouth.   . diclofenac sodium (VOLTAREN) 1 % GEL Apply 2-4 g topically 4 (four) times daily.   Marland Kitchen docusate sodium (COLACE) 100 MG capsule Take 100 mg by mouth daily as needed for mild constipation.  07/31/2016: PRN  . DULoxetine (CYMBALTA) 30 MG capsule Take 30 mg by mouth daily.    . hydrocortisone 2.5 % ointment Apply topically.   Marland Kitchen ipratropium-albuterol (DUONEB) 0.5-2.5 (3) MG/3ML SOLN Take 3 mLs by nebulization every 4 (four) hours as needed.   . metoCLOPramide (REGLAN) 10 MG tablet Take 1 tablet (10 mg total) by mouth every 6 (six) hours as needed.   . montelukast (SINGULAIR) 10 MG tablet Take 10 mg by mouth daily.   . Multiple Vitamins-Minerals (CENTRUM ADULTS PO) Take by mouth. Reported on 09/28/2015   . nabumetone (RELAFEN) 750 MG tablet Take 750 mg by mouth 2 (two) times daily as needed.   Marland Kitchen olmesartan (BENICAR) 5 MG tablet Take 5 mg by mouth 2 (two) times daily.    . Oxycodone HCl 10 MG TABS Take 5-10 mg by mouth See admin  instructions. Take 5-10 mg 3-5 times daily if tolerated   . promethazine (PHENERGAN) 25 MG tablet Take 25 mg by mouth every 6 (six) hours as needed for nausea or vomiting.   . rizatriptan (MAXALT) 10 MG tablet Take 10 mg by mouth as needed for migraine. May repeat in 2 hours if needed   . scopolamine (TRANSDERM-SCOP, 1.5 MG,) 1 MG/3DAYS Place 1 patch (1.5 mg total) onto the skin every 3 (three) days.   Marland Kitchen tiZANidine (ZANAFLEX) 4 MG tablet Take 4 mg by mouth 4 (four) times daily.    . traZODone (DESYREL) 100 MG tablet Take  100 mg by mouth at bedtime.   . triamcinolone cream (KENALOG) 0.1 % Apply topically.   . trospium (SANCTURA) 20 MG tablet Take by mouth.   . valACYclovir (VALTREX) 1000 MG tablet Take 1,000 mg by mouth as needed.    . venlafaxine XR (EFFEXOR-XR) 37.5 MG 24 hr capsule Take 37.5 mg by mouth daily.    . vitamin C (ASCORBIC ACID) 500 MG tablet Take 500 mg by mouth daily.   Marland Kitchen azelastine (ASTELIN) 0.1 % nasal spray Place 1 spray into the nose 2 (two) times daily.    Marland Kitchen gabapentin (NEURONTIN) 800 MG tablet Take by mouth.   . linaclotide (LINZESS) 72 MCG capsule Take by mouth.   . [DISCONTINUED] diazepam (VALIUM) 10 MG tablet Take 30 minutes prior to the cystoscopy (Patient not taking: Reported on 09/04/2018)   . [DISCONTINUED] erythromycin ophthalmic ointment Place 1 application into both eyes at bedtime. (Patient not taking: Reported on 11/09/2018)   . [DISCONTINUED] ketorolac (TORADOL) 10 MG tablet Take 1 tablet (10 mg total) by mouth every 6 (six) hours as needed for moderate pain. (Patient not taking: Reported on 11/09/2018)   . [DISCONTINUED] meclizine (ANTIVERT) 25 MG tablet Take 1 tablet (25 mg total) by mouth 3 (three) times daily as needed for dizziness or nausea. (Patient not taking: Reported on 11/09/2018)   . [DISCONTINUED] mirabegron ER (MYRBETRIQ) 25 MG TB24 tablet Take 1 tablet (25 mg total) by mouth daily. (Patient not taking: Reported on 11/09/2018)   . [DISCONTINUED] nabumetone (RELAFEN) 750 MG tablet Take by mouth.   . [DISCONTINUED] olopatadine (PATANOL) 0.1 % ophthalmic solution Place 1 drop into both eyes 2 (two) times daily. (Patient not taking: Reported on 11/09/2018)   . [DISCONTINUED] oxyCODONE-acetaminophen (PERCOCET/ROXICET) 5-325 MG tablet Take by mouth.   . [DISCONTINUED] phentermine 15 MG capsule Take 15 mg by mouth daily. Reported on 08/07/2015    No facility-administered encounter medications on file as of 11/09/2018.     Surgical History: Past Surgical History:  Procedure  Laterality Date  . ABDOMINAL HYSTERECTOMY     vaginal  . APPENDECTOMY    . FOOT SURGERY Left    10/2015  . NECK SURGERY      Medical History: Past Medical History:  Diagnosis Date  . Acid reflux   . Anxiety   . Arthritis   . Bronchitis 09/2015  . Gross hematuria   . H. pylori infection   . Heart murmur   . Kidney failure   . 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 Lung abnormality    "damaged lung due to pneumonia"  . Neck pain   . Nephrolithiasis   . Pneumonia   . Pneumothorax   . Scoliosis   . Urinary frequency   . Vaginal atrophy     Family History: Family History  Problem Relation Age of Onset  . Arthritis Mother   . Asthma  Mother   . Hyperlipidemia Mother   . Hypertension Mother   . Diabetes Sister   . Cancer Maternal Aunt        esophagus  . Breast cancer Neg Hx   . Kidney cancer Neg Hx   . Bladder Cancer Neg Hx     Social History: Social History   Socioeconomic History  . Marital status: Legally Separated    Spouse name: Not on file  . Number of children: Not on file  . Years of education: Not on file  . Highest education level: Not on file  Occupational History  . Not on file  Social Needs  . Financial resource strain: Not on file  . Food insecurity    Worry: Not on file    Inability: Not on file  . Transportation needs    Medical: Not on file    Non-medical: Not on file  Tobacco Use  . Smoking status: Never Smoker  . Smokeless tobacco: Never Used  Substance and Sexual Activity  . Alcohol use: No    Alcohol/week: 0.0 standard drinks  . Drug use: No  . Sexual activity: Not on file  Lifestyle  . Physical activity    Days per week: Not on file    Minutes per session: Not on file  . Stress: Not on file  Relationships  . Social Herbalist on phone: Not on file    Gets together: Not on file    Attends religious service: Not on file    Active member of club or organization: Not on file    Attends meetings of clubs or  organizations: Not on file    Relationship status: Not on file  . Intimate partner violence    Fear of current or ex partner: Not on file    Emotionally abused: Not on file    Physically abused: Not on file    Forced sexual activity: Not on file  Other Topics Concern  . Not on file  Social History Narrative  . Not on file    Vital Signs: Blood pressure (!) 133/95, pulse (!) 109, resp. rate 16, height 5' 5"  (1.651 m), weight 155 lb 6.4 oz (70.5 kg), SpO2 95 %.  Examination: General Appearance: The patient is well-developed, well-nourished, and in no distress. Skin: Gross inspection of skin unremarkable. Head: normocephalic, no gross deformities. Eyes: no gross deformities noted. ENT: ears appear grossly normal no exudates. Neck: Supple. No thyromegaly. No LAD. Respiratory: no rhonchi noted at this time. Cardiovascular: Normal S1 and S2 without murmur or rub. Extremities: No cyanosis. pulses are equal. Neurologic: Alert and oriented. No involuntary movements.  LABS: Recent Results (from the past 2160 hour(s))  Urinalysis, Complete     Status: None   Collection Time: 09/15/18  4:08 PM  Result Value Ref Range   Specific Gravity, UA 1.010 1.005 - 1.030   pH, UA 6.0 5.0 - 7.5   Color, UA Yellow Yellow   Appearance Ur Clear Clear   Leukocytes,UA Negative Negative   Protein,UA Negative Negative/Trace   Glucose, UA Negative Negative   Ketones, UA Negative Negative   RBC, UA Negative Negative   Bilirubin, UA Negative Negative   Urobilinogen, Ur 0.2 0.2 - 1.0 mg/dL   Nitrite, UA Negative Negative   Microscopic Examination See below:   CULTURE, URINE COMPREHENSIVE     Status: None   Collection Time: 09/15/18  4:08 PM   Specimen: Urine   UR  Result Value Ref  Range   Urine Culture, Comprehensive Final report    Organism ID, Bacteria Comment     Comment: Mixed urogenital flora 10,000-25,000 colony forming units per mL   Microscopic Examination     Status: None   Collection  Time: 09/15/18  4:08 PM   URINE  Result Value Ref Range   WBC, UA 0-5 0 - 5 /hpf   RBC None seen 0 - 2 /hpf   Epithelial Cells (non renal) 0-10 0 - 10 /hpf   Bacteria, UA None seen None seen/Few  Bladder Scan (Post Void Residual) in office     Status: None   Collection Time: 09/15/18  4:16 PM  Result Value Ref Range   Scan Result 0   CBC with Differential     Status: Abnormal   Collection Time: 10/21/18  7:09 PM  Result Value Ref Range   WBC 11.1 (H) 4.0 - 10.5 K/uL   RBC 3.97 3.87 - 5.11 MIL/uL   Hemoglobin 11.8 (L) 12.0 - 15.0 g/dL   HCT 35.7 (L) 36.0 - 46.0 %   MCV 89.9 80.0 - 100.0 fL   MCH 29.7 26.0 - 34.0 pg   MCHC 33.1 30.0 - 36.0 g/dL   RDW 12.4 11.5 - 15.5 %   Platelets 348 150 - 400 K/uL   nRBC 0.0 0.0 - 0.2 %   Neutrophils Relative % 62 %   Neutro Abs 6.9 1.7 - 7.7 K/uL   Lymphocytes Relative 30 %   Lymphs Abs 3.3 0.7 - 4.0 K/uL   Monocytes Relative 5 %   Monocytes Absolute 0.6 0.1 - 1.0 K/uL   Eosinophils Relative 2 %   Eosinophils Absolute 0.2 0.0 - 0.5 K/uL   Basophils Relative 1 %   Basophils Absolute 0.1 0.0 - 0.1 K/uL   Immature Granulocytes 0 %   Abs Immature Granulocytes 0.02 0.00 - 0.07 K/uL    Comment: Performed at [facility], [address] [place], [place] [address]  Comprehensive metabolic panel     Status: Abnormal   Collection Time: 10/21/18  7:09 PM  Result Value Ref Range   Sodium 140 135 - 145 mmol/L   Potassium 3.3 (L) 3.5 - 5.1 mmol/L   Chloride 103 98 - 111 mmol/L   CO2 27 22 - 32 mmol/L   Glucose, Bld 96 70 - 99 mg/dL   BUN 13 8 - 23 mg/dL   Creatinine, Ser 0.88 0.44 - 1.00 mg/dL   Calcium 9.4 8.9 - 10.3 mg/dL   Total Protein 7.5 6.5 - 8.1 g/dL   Albumin 4.2 3.5 - 5.0 g/dL   AST 21 15 - 41 U/L   ALT 13 0 - 44 U/L   Alkaline Phosphatase 65 38 - 126 U/L   Total Bilirubin 0.7 0.3 - 1.2 mg/dL   GFR calc non Af Amer >60 >60 mL/min   GFR calc Af Amer >60 >60 mL/min   Anion gap 10 5 - 15    Comment: Performed at [facility], [address] [place], [place] [address]  Troponin I (High Sensitivity)     Status: None   Collection Time: 10/21/18  7:09 PM  Result Value Ref Range   Troponin I (High Sensitivity) 3 <18 ng/L    Comment: (NOTE) Elevated high sensitivity troponin I (hsTnI) values and significant  changes across serial measurements may suggest ACS but many other  chronic and acute conditions are known to elevate hsTnI results.  Refer to the "Links" section  for chest pain algorithms and additional  guidance. Performed at [facility], [address] [place], [place] [address]   Magnesium     Status: None   Collection Time: 10/21/18  7:09 PM  Result Value Ref Range   Magnesium 2.0 1.7 - 2.4 mg/dL    Comment: Performed at [facility], [address] [place], [place] [address]  TSH     Status: None   Collection Time: 10/21/18  7:09 PM  Result Value Ref Range   TSH 0.794 0.350 - 4.500 uIU/mL    Comment: Performed by a 3rd Generation assay with a functional sensitivity of <=0.01 uIU/mL. Performed at [facility], [address] [place], [place] [address]   Bladder Scan (Post Void Residual) in office     Status: None   Collection Time: 10/28/18  1:52 PM  Result Value Ref Range   Scan Result 146m   Urinalysis, Complete     Status: Abnormal   Collection Time: 10/28/18  2:19 PM  Result Value Ref Range   Specific Gravity, UA <1.005 (L) 1.005 - 1.030   pH, UA 6.0 5.0 - 7.5   Color, UA Yellow Yellow   Appearance Ur Clear Clear   Leukocytes,UA Negative Negative   Protein,UA Negative Negative/Trace   Glucose, UA Negative Negative   Ketones, UA Negative Negative   RBC, UA Trace (A) Negative   Bilirubin, UA Negative Negative   Urobilinogen, Ur 0.2 0.2 - 1.0 mg/dL   Nitrite, UA Negative Negative   Microscopic Examination See below:   Microscopic Examination     Status: None   Collection Time: 10/28/18  2:19 PM   URINE  Result Value Ref Range   WBC, UA  0-5 0 - 5 /hpf   RBC None seen 0 - 2 /hpf   Epithelial Cells (non renal) None seen 0 - 10 /hpf   Bacteria, UA None seen None seen/Few  Basic metabolic panel     Status: Abnormal   Collection Time: 11/02/18  3:47 PM  Result Value Ref Range   Sodium 139 135 - 145 mmol/L   Potassium 3.3 (L) 3.5 - 5.1 mmol/L   Chloride 102 98 - 111 mmol/L   CO2 30 22 - 32 mmol/L   Glucose, Bld 93 70 - 99 mg/dL   BUN 7 (L) 8 - 23 mg/dL   Creatinine, Ser 0.93 0.44 - 1.00 mg/dL   Calcium 9.3 8.9 - 10.3 mg/dL   GFR calc non Af Amer >60 >60 mL/min   GFR calc Af Amer >60 >60 mL/min   Anion gap 7 5 - 15    Comment: [facility] [address], [place] [address] CBC     Status: Abnormal   Collection Time: 11/02/18  3:47 PM  Result Value Ref Range   WBC 8.1 4.0 - 10.5 K/uL   RBC 3.90 3.87 - 5.11 MIL/uL   Hemoglobin 11.6 (L) 12.0 - 15.0 g/dL   HCT 36.0 36.0 - 46.0 %   MCV 92.3 80.0 - 100.0 fL   MCH 29.7 26.0 - 34.0 pg   MCHC 32.2 30.0 - 36.0 g/dL   RDW 12.2 11.5 - 15.5 %   Platelets 349 150 - 400 K/uL   nRBC 0.0 0.0 - 0.2 %    Comment: Performed at [facility] [address], [place] [address] Troponin I (High Sensitivity)     Status: None   Collection Time: 11/02/18  3:47 PM  Result Value Ref Range   Troponin I (High Sensitivity) <2 <18 ng/L    Comment: (NOTE) Elevated high sensitivity troponin I (hsTnI) values and significant  changes across serial measurements may suggest ACS but many other  chronic and acute conditions are known to elevate hsTnI results.  Refer to the "Links" section for chest pain algorithms and additional  guidance. Performed at [facility], [address] [place], [place] [address]     Radiology: Dg Chest 2 View  Result Date: 11/02/2018 CLINICAL DATA:  Chest pain, shortness of breath. EXAM: CHEST - 2 VIEW COMPARISON:  Radiographs of June 24, 2017. FINDINGS: The heart size and mediastinal contours are  within normal limits. Left lung is clear. Minimal right basilar subsegmental atelectasis is noted. No pneumothorax or pleural effusion is noted. The visualized skeletal structures are unremarkable. IMPRESSION: Minimal right basilar subsegmental atelectasis. Electronically Signed   By: Marijo Conception M.D.   On: 11/02/2018 16:08    No results found.  Dg Chest 2 View  Result Date: 11/02/2018 CLINICAL DATA:  Chest pain, shortness of breath. EXAM: CHEST - 2 VIEW COMPARISON:  Radiographs of June 24, 2017. FINDINGS: The heart size and mediastinal contours are within normal limits. Left lung is clear. Minimal right basilar subsegmental atelectasis is noted. No pneumothorax or pleural effusion is noted. The visualized skeletal structures are unremarkable. IMPRESSION: Minimal right basilar subsegmental atelectasis. Electronically Signed   By: Marijo Conception M.D.   On: 11/02/2018 16:08      Assessment and Plan: Patient Active Problem List   Diagnosis Date Noted  . Bunion 05/06/2018  . Metatarsalgia of right foot 05/06/2018  . Age-related osteoporosis without current pathological fracture 03/31/2018  . False positive ana 03/31/2018  . Mouth dryness 02/18/2018  . Screening for osteoporosis 02/18/2018  . 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 Lacunar infarction ([place]) 01/06/2018  . Compression fracture of thoracic vertebra, sequela 07/12/2017  . Lumbar compression fracture, sequela 07/12/2017  . Nocturnal hypoxia 07/12/2017  . Simple chronic bronchitis ([place]) 07/12/2017  . Intractable back pain 06/05/2017  . Overweight (BMI 25.0-29.9) 04/08/2017  . Chronic pain syndrome 04/06/2017  . Sensory disturbance 01/08/2017  . Pain in the groin, right 10/28/2016  . Paresthesia of both hands 12/14/2015  . B12 deficiency 10/31/2015  . SOB (shortness of breath) 08/29/2015  . History of hematuria 07/12/2015  . Urinary frequency 07/12/2015  .  Vaginal atrophy 07/12/2015  . HAV (hallux abducto valgus) 07/02/2015  . Overlapping toe 07/02/2015  . Hammertoe 07/02/2015  . LBP (low back pain) 01/17/2015  . Trochanteric bursitis of right hip 01/17/2015  . PNA (pneumonia) 12/28/2014  . DDD (degenerative disc disease), lumbar 08/15/2014  . Lumbar radicular syndrome 08/15/2014  . 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 DDD (degenerative disc disease), cervical 08/15/2014  . Bilateral occipital neuralgia 08/15/2014  . Migraine 08/15/2014  . Sacroiliac joint dysfunction 08/15/2014  . Back pain, chronic 09/23/2013  . Chronic cervical pain 09/23/2013  . Arthralgia of temporomandibular joint 08/29/2013  . Absolute anemia 08/15/2013  . Chronic kidney disease (CKD), stage III (moderate) ([place]) 08/15/2013  . Abnormal serum level of alkaline phosphatase 08/15/2013  . Blush 08/15/2013  . Hematuria, microscopic 08/15/2013  . Inflamed nasal mucosa 08/15/2013  . Big thyroid 08/15/2013  . Disease of thyroid gland 08/15/2013  . Positive H. pylori test 08/15/2013  . Gastrointestinal ulcer due to Helicobacter pylori 28/78/6767  . Other  specified bacterial intestinal infections 08/14/2013  . Kidney failure 08/09/2013  . SIRS (systemic inflammatory response syndrome) ([place]) 08/09/2013  . Systemic inflammatory response syndrome (SIRS) ([place]) 08/09/2013    1. COPD reduced FEV1 but also has reduction in her VC. Suggest getting a CPFT the complete pulmonary function will actually help [place] to determine whether there is a restrictive component.  I did review her chest x-ray which showed some linear scarring the on the last chest film.  I will hold off on doing a CT for now until the get the complete pulmonary function studies done. 2. SOB has outlined above appears to be reduction in her spirometry which may be contributing to her underlying symptoms I am going to get complete pulmonary functions as already  outlined. 3. Snoring she notes that the symptoms have worsened and I think we will get a follow-up sleep study done she had a previous home study done which was unremarkable we will start with a home study if it is still unremarkable and her symptoms are persistent will do a in lab study.  General Counseling: I have discussed the findings of the evaluation and examination with Nicolas.  I have also discussed any further diagnostic evaluation thatmay be needed or ordered today. Lateya verbalizes understanding of the findings of todays visit. We also reviewed her medications today and discussed drug interactions and side effects including but not limited excessive drowsiness and altered mental states. We also discussed that there is always a risk not just to her but also people around her. she has been encouraged to call the office with any questions or concerns that should arise related to todays visit.    Time spent: 15 minutes  I have personally obtained a history, examined the patient, evaluated laboratory and imaging results, formulated the assessment and plan and placed orders.    Allyne Gee, MD [facility] Pulmonary and Critical Care Sleep medicine

## 2018-11-09 NOTE — Patient Instructions (Signed)
Budesonide; Formoterol Inhalation What is this medicine? BUDESONIDE; FORMOTEROL (byoo DES oh nide; for [employer] te [employer]) inhalation is a combination of 2 medicines that decrease inflammation and help to open up the airways in your lungs. It is used to treat asthma. It is also used to treat chronic obstructive pulmonary disease (COPD), including chronic bronchitis or emphysema. Do NOT use for an acute asthma or COPD attack. This medicine may be used for other purposes; ask your health care provider or pharmacist if you have questions. COMMON BRAND NAME(S): Symbicort What should I tell my health care provider before I take this medicine? They need to know if you have any of these conditions:  bone problems  diabetes  heart disease or irregular heartbeat  high blood pressure  immune system problems  infection  liver disease  worsening asthma  an unusual or allergic reaction to budesonide, formoterol, medicines, foods, dyes, or preservatives  pregnant or trying to get pregnant  breast-feeding How should I use this medicine? This medicine is inhaled through the mouth. Rinse your mouth with water after use. Make sure not to swallow the water. Follow the directions on your prescription label. Do not use more often than directed. Do not stop taking except on your doctor's advice. Make sure that you are using your inhaler correctly. Ask your doctor or health care provider if you have any questions. A special MedGuide will be given to you by the pharmacist with each prescription and refill. Be sure to read this information carefully each time. Talk to your pediatrician regarding the use of this medicine in children. While this drug may be prescribed for children as young as 29 years of age for selected conditions, precautions do apply. Overdosage: If you think you have taken too much of this medicine contact a poison control center or emergency room at once. NOTE: This medicine is only for you. Do  not share this medicine with others. What if I miss a dose? If you miss a dose, use it as soon as you remember. If it is almost time for your next dose, use only that dose and continue with your regular schedule, spacing doses evenly. Do not use double or extra doses. What may interact with this medicine? Do not take this medicine with any of the following medications:  MAOIs like Carbex, Eldepryl, Marplan, Nardil, and Parnate  mifepristone  probucol  procarbazine  some other medicines for asthma like formoterol, salmeterol This medicine may also interact with the following medications:  antibiotics like clarithromycin, erythromycin  cimetidine  diuretics  grapefruit juice  itraconazole  ketoconazole  medicines for depression, anxiety, or psychotic disturbances  medicines for irregular heartbeat  methadone  some heart medicines like atenolol, metoprolol  some other medicines for breathing problems  some vaccines This list may not describe all possible interactions. Give your health care provider a list of all the medicines, herbs, non-prescription drugs, or dietary supplements you use. Also tell them if you smoke, drink alcohol, or use illegal drugs. Some items may interact with your medicine. What should I watch for while using this medicine? Tell your doctor or health care professional if your symptoms do not improve or get worse. Do not use this medicine more than every 12 hours. NEVER use this medicine for an acute asthma or COPD attack. You should use your short-acting rescue inhalers for this purpose. If your symptoms get worse or if you need your short-acting inhalers more often, call your doctor right away. This medicine  may increase your risk of getting an infection. Tell your doctor or health care professional if you are around anyone with measles or chickenpox, or if you develop sores or blisters that do not heal properly. What side effects may I notice from  receiving this medicine? Side effects that you should report to your doctor or health care professional as soon as possible:  allergic reactions such as skin rash or itching, hives, swelling of the face, lips or tongue  breathing problems  changes in vision  chest pain  fast, irregular heartbeat  feeling faint or lightheaded, falls  fever  high blood pressure  nervousness  tremors  white patches or sores in mouth Side effects that usually do not require medical attention (report to your doctor or health care professional if they continue or are bothersome):  cough  different taste in mouth  headache  sore throat  stuffy nose  stomach upset This list may not describe all possible side effects. Call your doctor for medical advice about side effects. You may report side effects to FDA at 1-800-FDA-1088. Where should I keep my medicine? Keep out of the reach of children. Store in a dry place at room temperature between 20 and 25 degrees C (68 and 77 degrees F). Do not get the inhaler wet. Keep track of the number of doses used. Throw away the inhaler after using the marked number of inhalations or after the expiration date, whichever comes first. Do not burn or puncture canister. NOTE: This sheet is a summary. It may not cover all possible information. If you have questions about this medicine, talk to your doctor, pharmacist, or health care provider.  2020 Elsevier/Gold Standard (2016-03-07 15:25:18)

## 2018-11-13 ENCOUNTER — Ambulatory Visit: Payer: PRIVATE HEALTH INSURANCE

## 2018-11-18 ENCOUNTER — Other Ambulatory Visit: Payer: PRIVATE HEALTH INSURANCE | Admitting: Internal Medicine

## 2018-11-20 ENCOUNTER — Other Ambulatory Visit: Payer: Self-pay

## 2018-11-20 ENCOUNTER — Ambulatory Visit: Payer: PRIVATE HEALTH INSURANCE

## 2018-11-20 ENCOUNTER — Other Ambulatory Visit
Admission: RE | Admit: 2018-11-20 | Discharge: 2018-11-20 | Disposition: A | Discharge status: Home / Self Care | Payer: PRIVATE HEALTH INSURANCE | Attending: Nephrology | Admitting: Nephrology

## 2018-11-20 DIAGNOSIS — R310 Gross hematuria: Secondary | ICD-10-CM | POA: Diagnosis not present

## 2018-11-20 LAB — COMPREHENSIVE METABOLIC PANEL
ALT: 12 U/L (ref 0–44)
AST: 20 U/L (ref 15–41)
Albumin: 4.1 g/dL (ref 3.5–5.0)
Alkaline Phosphatase: 65 U/L (ref 38–126)
Anion gap: 11 (ref 5–15)
BUN: 6 mg/dL — ABNORMAL LOW (ref 8–23)
CO2: 25 mmol/L (ref 22–32)
Calcium: 9.5 mg/dL (ref 8.9–10.3)
Chloride: 102 mmol/L (ref 98–111)
Creatinine, Ser: 0.82 mg/dL (ref 0.44–1.00)
GFR calc Af Amer: >60 mL/min (ref >60)
GFR calc non Af Amer: >60 mL/min (ref >60)
Glucose, Bld: 100 mg/dL — ABNORMAL HIGH (ref 70–99)
Potassium: 3.9 mmol/L (ref 3.5–5.1)
Sodium: 138 mmol/L (ref 135–145)
Total Bilirubin: 0.5 mg/dL (ref 0.3–1.2)
Total Protein: 8.1 g/dL (ref 6.5–8.1)

## 2018-11-21 LAB — C4 COMPLEMENT: Complement C4, Body Fluid: 38 mg/dL (ref 14–44)

## 2018-11-21 LAB — ANA W/REFLEX IF POSITIVE: Anti Nuclear Antibody (ANA): NEGATIVE

## 2018-11-21 LAB — C3 COMPLEMENT: C3 Complement: 124 mg/dL (ref 82–167)

## 2018-11-23 LAB — MISC LABCORP TEST (SEND OUT): Labcorp test code: 163857

## 2018-11-24 LAB — GLOMERULAR BASEMENT MEMBRANE ANTIBODIES: GBM Ab: 10 units (ref 0–20)

## 2018-11-25 ENCOUNTER — Other Ambulatory Visit: Payer: Self-pay

## 2018-11-25 ENCOUNTER — Ambulatory Visit: Payer: PRIVATE HEALTH INSURANCE | Admitting: Internal Medicine

## 2018-11-25 DIAGNOSIS — R0602 Shortness of breath: Secondary | ICD-10-CM | POA: Diagnosis not present

## 2018-11-25 LAB — MISC LABCORP TEST (SEND OUT): Labcorp test code: 123006

## 2018-11-25 LAB — PULMONARY FUNCTION TEST

## 2018-11-26 ENCOUNTER — Ambulatory Visit: Payer: PRIVATE HEALTH INSURANCE | Admitting: Internal Medicine

## 2018-11-27 ENCOUNTER — Ambulatory Visit: Payer: PRIVATE HEALTH INSURANCE

## 2018-12-01 NOTE — Progress Notes (Signed)
12/02/2018 11:00 AM   [place] 09/11/1955 [id number]  Referring provider: Sharyne Peach, MD [place],  [place] [address]  Chief Complaint  Patient presents with  . Hematuria    HPI: Rachael Jordan is a 63 year old female with a history of gross hematuria, urinary frequency and vaginal atrophy who presents today for further evaluation.  History of hematuria (high risk) Non-smoker.  Hematuria work up in 06/2014 was negative.  CTU in 10/2017 revealed normal adrenal glands. The kidneys are both unremarkable. No kidney stones identified. No hydronephrosis or hydroureter. No kidney mass identified. No suspicious filling defects identified within the collecting systems, ureters or urinary bladder.  Cystoscopy in 10/2017 with Dr. Bernardo Heater was NED.  She reported an episode of gross hematuria in June 2020.  Her UA was negative for AMH on 10/28/2018 and today.    Urinary frequency Myrbetriq was cost prohibitive.  Discontinued PTNS treatments. She is experiencing urgency x 0-3 (stable) , frequency x 4-7 (stable), is restricting fluids to avoid visits to the restroom, is engaging in toilet mapping, incontinence x 4-7 (worse) and nocturia x 4-7 (stable).   Her BP is 155/103.   Her PVR is 0 mL.  She is taking the Sanctura IR 20 mg once daily and is satisfied with effect.  She is having issues with stress incontinence.    PMH: Past Medical History:  Diagnosis Date  . Acid reflux   . Anxiety   . Arthritis   . Bronchitis 09/2015  . Gross hematuria   . H. pylori infection   . Heart murmur   . Kidney failure   . 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 Lung abnormality    "damaged lung due to pneumonia"  . Neck pain   . Nephrolithiasis   . Pneumonia   . Pneumothorax   . Scoliosis   . Urinary frequency   . Vaginal atrophy     Surgical History: Past Surgical History:  Procedure Laterality Date  . ABDOMINAL HYSTERECTOMY     vaginal  . APPENDECTOMY    . FOOT  SURGERY Left    10/2015  . NECK SURGERY      Home Medications:  Allergies as of 12/02/2018      Reactions   Grapefruit Bioflavonoid Complex Rash   Grapefruit Extract Rash   Propoxyphene Nausea And Vomiting, Nausea Only   Prednisolone    Psychotic features      Medication List       Accurate as of December 02, 2018 11:00 AM. If you have any questions, ask your nurse or doctor.        albuterol 108 (90 Base) MCG/ACT inhaler Commonly known as: VENTOLIN HFA Inhale 1 puff into the lungs every 6 (six) hours as needed for wheezing or shortness of breath.   aspirin EC 81 MG tablet Take by mouth.   atorvastatin 20 MG tablet Commonly known as: LIPITOR Take by mouth.   azelastine 0.1 % nasal spray Commonly known as: ASTELIN Place 1 spray into the nose 2 (two) times daily.   Biotin 10000 MCG Tabs Take 1 tablet by mouth daily.   budesonide-formoterol 80-4.5 MCG/ACT inhaler Commonly known as: SYMBICORT Inhale 2 puffs into the lungs 2 (two) times daily.   Calcium Carbonate-Vitamin D3 600-400 MG-UNIT Tabs Take 1 tablet by mouth daily.   celecoxib 200 MG capsule Commonly known as: CELEBREX Take 200 mg by mouth 2 (two) times daily.   CENTRUM ADULTS PO Take by mouth.  Reported on 09/28/2015   cetirizine 10 MG tablet Commonly known as: ZYRTEC Take 10 mg by mouth at bedtime. Reported on 08/01/2015   diclofenac sodium 1 % Gel Commonly known as: VOLTAREN Apply 2-4 g topically 4 (four) times daily.   docusate sodium 100 MG capsule Commonly known as: COLACE Take 100 mg by mouth daily as needed for mild constipation.   DULoxetine 30 MG capsule Commonly known as: CYMBALTA Take 30 mg by mouth daily.   gabapentin 800 MG tablet Commonly known as: NEURONTIN Take by mouth.   hydrocortisone 2.5 % ointment Apply topically.   ipratropium-albuterol 0.5-2.5 (3) MG/3ML Soln Commonly known as: DUONEB Take 3 mLs by nebulization every 4 (four) hours as needed.   Linzess 72 MCG  capsule Generic drug: linaclotide Take by mouth.   metoCLOPramide 10 MG tablet Commonly known as: REGLAN Take 1 tablet (10 mg total) by mouth every 6 (six) hours as needed.   montelukast 10 MG tablet Commonly known as: SINGULAIR Take 10 mg by mouth daily.   nabumetone 750 MG tablet Commonly known as: RELAFEN Take 750 mg by mouth 2 (two) times daily as needed.   olmesartan 5 MG tablet Commonly known as: BENICAR Take 5 mg by mouth 2 (two) times daily.   Oxycodone HCl 10 MG Tabs Take 5-10 mg by mouth See admin instructions. Take 5-10 mg 3-5 times daily if tolerated   promethazine 25 MG tablet Commonly known as: PHENERGAN Take 25 mg by mouth every 6 (six) hours as needed for nausea or vomiting.   rizatriptan 10 MG tablet Commonly known as: MAXALT Take 10 mg by mouth as needed for migraine. May repeat in 2 hours if needed   scopolamine 1 MG/3DAYS Commonly known as: Transderm-Scop (1.5 MG) Place 1 patch (1.5 mg total) onto the skin every 3 (three) days.   tiZANidine 4 MG tablet Commonly known as: ZANAFLEX Take 4 mg by mouth 4 (four) times daily.   traZODone 100 MG tablet Commonly known as: DESYREL Take 100 mg by mouth at bedtime.   triamcinolone cream 0.1 % Commonly known as: KENALOG Apply topically.   trospium 20 MG tablet Commonly known as: SANCTURA Take by mouth.   valACYclovir 1000 MG tablet Commonly known as: VALTREX Take 1,000 mg by mouth as needed.   venlafaxine XR 37.5 MG 24 hr capsule Commonly known as: EFFEXOR-XR Take 37.5 mg by mouth daily.   vitamin C 500 MG tablet Commonly known as: ASCORBIC ACID Take 500 mg by mouth daily.   Vitamin D-1000 Max St 25 MCG (1000 UT) tablet Generic drug: Cholecalciferol Take by mouth.       Allergies:  Allergies  Allergen Reactions  . Grapefruit Bioflavonoid Complex Rash  . Grapefruit Extract Rash  . Propoxyphene Nausea And Vomiting and Nausea Only  . Prednisolone     Psychotic features    Family  History: Family History  Problem Relation Age of Onset  . Arthritis Mother   . Asthma Mother   . Hyperlipidemia Mother   . Hypertension Mother   . Diabetes Sister   . Cancer Maternal Aunt        esophagus  . Breast cancer Neg Hx   . Kidney cancer Neg Hx   . Bladder Cancer Neg Hx     Social History:  reports that she has never smoked. She has never used smokeless tobacco. She reports that she does not drink alcohol or use drugs.  ROS: UROLOGY Frequent Urination?: No Hard to postpone urination?: No  Burning/pain with urination?: No Get up at night to urinate?: No Leakage of urine?: Yes Urine stream starts and stops?: No Trouble starting stream?: No Do you have to strain to urinate?: No Blood in urine?: No Urinary tract infection?: No Sexually transmitted disease?: No Injury to kidneys or bladder?: No Painful intercourse?: No Weak stream?: No Currently pregnant?: No Vaginal bleeding?: No Last menstrual period?: n  Gastrointestinal Nausea?: No Vomiting?: No Indigestion/heartburn?: No Diarrhea?: No Constipation?: No  Constitutional Fever: No Night sweats?: No Weight loss?: No Fatigue?: No  Skin Skin rash/lesions?: No Itching?: No  Eyes Blurred vision?: No Double vision?: No  Ears/Nose/Throat Sore throat?: No Sinus problems?: No  Hematologic/Lymphatic Swollen glands?: No Easy bruising?: No  Cardiovascular Leg swelling?: No Chest pain?: No  Respiratory Cough?: No Shortness of breath?: No  Endocrine Excessive thirst?: No  Musculoskeletal Back pain?: No Joint pain?: No  Neurological Headaches?: No Dizziness?: No  Psychologic Depression?: No Anxiety?: No  Physical Exam: BP (!) 153/103 (BP Location: Left Arm, Patient Position: Sitting, Cuff Size: Normal)   Pulse 82   Ht 5' 5"  (1.651 m)   Wt 156 lb (70.8 kg)   BMI 25.96 kg/m   Constitutional:  Well nourished. Alert and oriented, No acute distress. HEENT: [place] AT, moist mucus membranes.   Trachea midline, no masses. Cardiovascular: No clubbing, cyanosis, or edema. Respiratory: Normal respiratory effort, no increased work of breathing. Neurologic: Grossly intact, no focal deficits, moving all 4 extremities. Psychiatric: Normal mood and affect.   Laboratory Data: Lab Results  Component Value Date   WBC 8.1 11/02/2018   HGB 11.6 (L) 11/02/2018   HCT 36.0 11/02/2018   MCV 92.3 11/02/2018   PLT 349 11/02/2018    Lab Results  Component Value Date   CREATININE 0.82 11/20/2018    No results found for: PSA  No results found for: TESTOSTERONE  No results found for: HGBA1C  Lab Results  Component Value Date   TSH 0.794 10/21/2018    No results found for: CHOL, HDL, CHOLHDL, VLDL, LDLCALC  Lab Results  Component Value Date   AST 20 11/20/2018   Lab Results  Component Value Date   ALT 12 11/20/2018   No components found for: ALKALINEPHOPHATASE No components found for: BILIRUBINTOTAL  No results found for: ESTRADIOL  Urinalysis Component     Latest Ref Rng & Units 10/28/2018  Specific Gravity, UA     1.005 - 1.030 <1.005 (L)  pH, UA     5.0 - 7.5 6.0  Color, UA     Yellow Yellow  Appearance Ur     Clear Clear  Leukocytes,UA     Negative Negative  Protein,UA     Negative/Trace Negative  Glucose, UA     Negative Negative  Ketones, UA     Negative Negative  RBC, UA     Negative Trace (A)  Bilirubin, UA     Negative Negative  Urobilinogen, Ur     0.2 - 1.0 mg/dL 0.2  Nitrite, UA     Negative Negative  Microscopic Examination      See below:   Component     Latest Ref Rng & Units 10/28/2018  Bacteria, UA     None seen/Few None seen   Component     Latest Ref Rng & Units 12/02/2018  Specific Gravity, UA     1.005 - 1.030 1.020  pH, UA     5.0 - 7.5 7.0  Color, UA     Yellow  Yellow  Appearance Ur     Clear Clear  Leukocytes,UA     Negative Negative  Protein,UA     Negative/Trace Negative  Glucose, UA     Negative Negative   Ketones, UA     Negative Negative  RBC, UA     Negative Negative  Bilirubin, UA     Negative Negative  Urobilinogen, Ur     0.2 - 1.0 mg/dL 0.2  Nitrite, UA     Negative Negative  Microscopic Examination      See below:   Component     Latest Ref Rng & Units 12/02/2018  WBC, UA     0 - 5 /hpf 0-5  RBC     0 - 2 /hpf 0-2  Epithelial Cells (non renal)     0 - 10 /hpf 0-10  Bacteria, UA     None seen/Few None seen   I have reviewed the labs.   Pertinent Imaging: Results for IDALY, VERRET (MRN [id number]) as of 12/02/2018 11:10  Ref. Range 12/02/2018 11:08  Scan Result Unknown 0    Assessment & Plan:    1. Gross hematuria (high risk) UA is negative  2. History of hematuria Hematuria work up completed in 10/2017 - findings positive for NED Report of gross hematuria in June 2020 UA today negative RTC in one year for UA Patient to report any further episodes of hematuria  3. Feelings of incomplete bladder emptying Resolved  4. SUI Continue Sanctura IR 20 mg once daily Offered referral to PT, but would like to think about and will call back if she decides to schedule     Return in about 1 year (around 12/02/2019) for OAB questionnaire, UA, PVR and exam.  These notes generated with voice recognition software. I apologize for typographical errors.  Zara Council, PA-C  [facility] [address]  [place], [place] [address] [phone]

## 2018-12-02 ENCOUNTER — Ambulatory Visit: Payer: PRIVATE HEALTH INSURANCE | Admitting: Internal Medicine

## 2018-12-02 ENCOUNTER — Ambulatory Visit (INDEPENDENT_AMBULATORY_CARE_PROVIDER_SITE_OTHER): Payer: PRIVATE HEALTH INSURANCE | Admitting: Urology

## 2018-12-02 ENCOUNTER — Other Ambulatory Visit: Payer: Self-pay

## 2018-12-02 ENCOUNTER — Encounter: Payer: Self-pay | Admitting: Urology

## 2018-12-02 VITALS — BP 153/103 | HR 82 | Ht 65.0 in | Wt 156.0 lb

## 2018-12-02 DIAGNOSIS — G4734 Idiopathic sleep related nonobstructive alveolar hypoventilation: Secondary | ICD-10-CM

## 2018-12-02 DIAGNOSIS — N393 Stress incontinence (female) (male): Secondary | ICD-10-CM | POA: Diagnosis not present

## 2018-12-02 DIAGNOSIS — R3914 Feeling of incomplete bladder emptying: Secondary | ICD-10-CM | POA: Diagnosis not present

## 2018-12-02 DIAGNOSIS — G4710 Hypersomnia, unspecified: Secondary | ICD-10-CM | POA: Diagnosis not present

## 2018-12-02 DIAGNOSIS — Z87448 Personal history of other diseases of urinary system: Secondary | ICD-10-CM

## 2018-12-02 LAB — URINALYSIS, COMPLETE
Bilirubin, UA: NEGATIVE
Glucose, UA: NEGATIVE
Ketones, UA: NEGATIVE
Leukocytes,UA: NEGATIVE
Nitrite, UA: NEGATIVE
Protein,UA: NEGATIVE
RBC, UA: NEGATIVE
Specific Gravity, UA: 1.02 (ref 1.005–1.030)
Urobilinogen, Ur: 0.2 mg/dL (ref 0.2–1.0)
pH, UA: 7 (ref 5.0–7.5)

## 2018-12-02 LAB — BLADDER SCAN AMB NON-IMAGING: Scan Result: 0

## 2018-12-02 LAB — MICROSCOPIC EXAMINATION: Bacteria, UA: NONE SEEN

## 2018-12-04 ENCOUNTER — Ambulatory Visit: Payer: PRIVATE HEALTH INSURANCE

## 2018-12-04 ENCOUNTER — Other Ambulatory Visit: Payer: Self-pay | Admitting: Family Medicine

## 2018-12-04 DIAGNOSIS — Z1231 Encounter for screening mammogram for malignant neoplasm of breast: Secondary | ICD-10-CM

## 2018-12-04 NOTE — Procedures (Signed)
[facility] [place], [address]  DATE OF SERVICE: November 25, 2018  Complete Pulmonary Function Testing Interpretation:  FINDINGS:  Forced vital capacity is mildly decreased.  FEV1 is mildly decreased.  FEV1 FVC ratio is within normal limits.  Postbronchodilator there is no significant improvement in the FEV1.  Flow volume loop also shows suboptimal effort.  Total lung capacity is mildly decreased residual volume is normal residual volume: Capacity ratio is increased thoracic gas volume is normal.  DLCO is mildly decreased.  DLCO corrected for alveolar volume is within normal limits.  IMPRESSION:  This pulmonary function study is suggestive of mild restrictive lung disease.  There is a reduction in the DLCO however it is normal when corrected for alveolar volume clinical correlation is recommended.  Allyne Gee, MD [facility] Critical [facility] Medicine Sleep Medicine

## 2018-12-08 ENCOUNTER — Other Ambulatory Visit: Payer: Self-pay

## 2018-12-08 ENCOUNTER — Encounter: Payer: Self-pay | Admitting: Internal Medicine

## 2018-12-08 ENCOUNTER — Ambulatory Visit: Payer: PRIVATE HEALTH INSURANCE | Admitting: Internal Medicine

## 2018-12-08 VITALS — BP 114/80 | HR 98 | Resp 16 | Ht 65.5 in | Wt 155.0 lb

## 2018-12-08 DIAGNOSIS — G4734 Idiopathic sleep related nonobstructive alveolar hypoventilation: Secondary | ICD-10-CM

## 2018-12-08 DIAGNOSIS — J4520 Mild intermittent asthma, uncomplicated: Secondary | ICD-10-CM

## 2018-12-08 DIAGNOSIS — Z9981 Dependence on supplemental oxygen: Secondary | ICD-10-CM

## 2018-12-08 DIAGNOSIS — J449 Chronic obstructive pulmonary disease, unspecified: Secondary | ICD-10-CM | POA: Diagnosis not present

## 2018-12-08 NOTE — Progress Notes (Signed)
[facility] [place], [place] [address]  Pulmonary Sleep Medicine   Office Visit Note  Patient Name: Rachael Jordan DOB: 1955/05/31 MRN [id number]  Date of Service: 12/08/2018  Complaints/HPI: Pt is here to review results of sleep study and PFT.  Her pft show mild restrictive lung disease. Her sleep study hows an overall ahi of 3.7/hr.  She does have a saturation level of 87% on the study also.  She already uses oxygen at night, and reports good compliance with this. She reports she has been using her symbicort as needed, not daily.  She requires her rescue inhaler rarely.    ROS  General: (-) fever, (-) chills, (-) night sweats, (-) weakness Skin: (-) rashes, (-) itching,. Eyes: (-) visual changes, (-) redness, (-) itching. Nose and Sinuses: (-) nasal stuffiness or itchiness, (-) postnasal drip, (-) nosebleeds, (-) sinus trouble. Mouth and Throat: (-) sore throat, (-) hoarseness. Neck: (-) swollen glands, (-) enlarged thyroid, (-) neck pain. Respiratory: - cough, (-) bloody sputum, - shortness of breath, - wheezing. Cardiovascular: - ankle swelling, (-) chest pain. Lymphatic: (-) lymph node enlargement. Neurologic: (-) numbness, (-) tingling. Psychiatric: (-) anxiety, (-) depression   Current Medication: Outpatient Encounter Medications as of 12/08/2018  Medication Sig Note  . albuterol (VENTOLIN HFA) 108 (90 Base) MCG/ACT inhaler Inhale 1 puff into the lungs every 6 (six) hours as needed for wheezing or shortness of breath.   Marland Kitchen aspirin EC 81 MG tablet Take by mouth.   Marland Kitchen atorvastatin (LIPITOR) 20 MG tablet Take by mouth.   . Biotin 10000 MCG TABS Take 1 tablet by mouth daily.   . budesonide-formoterol (SYMBICORT) 80-4.5 MCG/ACT inhaler Inhale 2 puffs into the lungs 2 (two) times daily.   . Calcium Carbonate-Vitamin D3 600-400 MG-UNIT TABS Take 1 tablet by mouth daily.    . celecoxib (CELEBREX) 200 MG capsule Take 200 mg by mouth 2 (two) times daily.    . cetirizine (ZYRTEC) 10 MG tablet Take 10 mg by mouth at bedtime. Reported on 08/01/2015   . Cholecalciferol (VITAMIN D-1000 MAX ST) 25 MCG (1000 UT) tablet Take by mouth.   . diclofenac sodium (VOLTAREN) 1 % GEL Apply 2-4 g topically 4 (four) times daily.   Marland Kitchen docusate sodium (COLACE) 100 MG capsule Take 100 mg by mouth daily as needed for mild constipation.  07/31/2016: PRN  . DULoxetine (CYMBALTA) 30 MG capsule Take 30 mg by mouth daily.    . hydrocortisone 2.5 % ointment Apply topically.   Marland Kitchen ipratropium-albuterol (DUONEB) 0.5-2.5 (3) MG/3ML SOLN Take 3 mLs by nebulization every 4 (four) hours as needed.   . metoCLOPramide (REGLAN) 10 MG tablet Take 1 tablet (10 mg total) by mouth every 6 (six) hours as needed.   . montelukast (SINGULAIR) 10 MG tablet Take 10 mg by mouth daily.   . Multiple Vitamins-Minerals (CENTRUM ADULTS PO) Take by mouth. Reported on 09/28/2015   . nabumetone (RELAFEN) 750 MG tablet Take 750 mg by mouth 2 (two) times daily as needed.   Marland Kitchen olmesartan (BENICAR) 5 MG tablet Take 5 mg by mouth 2 (two) times daily.    . Oxycodone HCl 10 MG TABS Take 5-10 mg by mouth See admin instructions. Take 5-10 mg 3-5 times daily if tolerated   . promethazine (PHENERGAN) 25 MG tablet Take 25 mg by mouth every 6 (six) hours as needed for nausea or vomiting.   . rizatriptan (MAXALT) 10 MG tablet Take 10 mg by mouth as needed for  migraine. May repeat in 2 hours if needed   . scopolamine (TRANSDERM-SCOP, 1.5 MG,) 1 MG/3DAYS Place 1 patch (1.5 mg total) onto the skin every 3 (three) days.   Marland Kitchen tiZANidine (ZANAFLEX) 4 MG tablet Take 4 mg by mouth 4 (four) times daily.    . traZODone (DESYREL) 100 MG tablet Take 100 mg by mouth at bedtime.   . triamcinolone cream (KENALOG) 0.1 % Apply topically.   . trospium (SANCTURA) 20 MG tablet Take by mouth.   . valACYclovir (VALTREX) 1000 MG tablet Take 1,000 mg by mouth as needed.    . venlafaxine XR (EFFEXOR-XR) 37.5 MG 24 hr capsule Take 37.5 mg by mouth  daily.    . vitamin C (ASCORBIC ACID) 500 MG tablet Take 500 mg by mouth daily.   Marland Kitchen azelastine (ASTELIN) 0.1 % nasal spray Place 1 spray into the nose 2 (two) times daily.    Marland Kitchen gabapentin (NEURONTIN) 800 MG tablet Take by mouth.   . linaclotide (LINZESS) 72 MCG capsule Take by mouth.    No facility-administered encounter medications on file as of 12/08/2018.     Surgical History: Past Surgical History:  Procedure Laterality Date  . ABDOMINAL HYSTERECTOMY     vaginal  . APPENDECTOMY    . FOOT SURGERY Left    10/2015  . NECK SURGERY      Medical History: Past Medical History:  Diagnosis Date  . Acid reflux   . Anxiety   . Arthritis   . Bronchitis 09/2015  . Gross hematuria   . H. pylori infection   . Heart murmur   . Kidney failure   . 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 Lung abnormality    "damaged lung due to pneumonia"  . Neck pain   . Nephrolithiasis   . Pneumonia   . Pneumothorax   . Scoliosis   . Urinary frequency   . Vaginal atrophy     Family History: Family History  Problem Relation Age of Onset  . Arthritis Mother   . Asthma Mother   . Hyperlipidemia Mother   . Hypertension Mother   . Diabetes Sister   . Cancer Maternal Aunt        esophagus  . Breast cancer Neg Hx   . Kidney cancer Neg Hx   . Bladder Cancer Neg Hx     Social History: Social History   Socioeconomic History  . Marital status: Legally Separated    Spouse name: Not on file  . Number of children: Not on file  . Years of education: Not on file  . Highest education level: Not on file  Occupational History  . Not on file  Social Needs  . Financial resource strain: Not on file  . Food insecurity    Worry: Not on file    Inability: Not on file  . Transportation needs    Medical: Not on file    Non-medical: Not on file  Tobacco Use  . Smoking status: Never Smoker  . Smokeless tobacco: Never Used  Substance and Sexual Activity  . Alcohol use: No    Alcohol/week:  0.0 standard drinks  . Drug use: No  . Sexual activity: Not on file  Lifestyle  . Physical activity    Days per week: Not on file    Minutes per session: Not on file  . Stress: Not on file  Relationships  . Social Herbalist on phone: Not on file    Gets together:  Not on file    Attends religious service: Not on file    Active member of club or organization: Not on file    Attends meetings of clubs or organizations: Not on file    Relationship status: Not on file  . Intimate partner violence    Fear of current or ex partner: Not on file    Emotionally abused: Not on file    Physically abused: Not on file    Forced sexual activity: Not on file  Other Topics Concern  . Not on file  Social History Narrative  . Not on file    Vital Signs: Blood pressure 114/80, pulse 98, resp. rate 16, height 5' 5.5" (1.664 m), weight 155 lb (70.3 kg), SpO2 98 %.  Examination: General Appearance: The patient is well-developed, well-nourished, and in no distress. Skin: Gross inspection of skin unremarkable. Head: normocephalic, no gross deformities. Eyes: no gross deformities noted. ENT: ears appear grossly normal no exudates. Neck: Supple. No thyromegaly. No LAD. Respiratory: clear bilateraly. Cardiovascular: Normal S1 and S2 without murmur or rub. Extremities: No cyanosis. pulses are equal. Neurologic: Alert and oriented. No involuntary movements.  LABS: Recent Results (from the past 2160 hour(s))  Urinalysis, Complete     Status: None   Collection Time: 09/15/18  4:08 PM  Result Value Ref Range   Specific Gravity, UA 1.010 1.005 - 1.030   pH, UA 6.0 5.0 - 7.5   Color, UA Yellow Yellow   Appearance Ur Clear Clear   Leukocytes,UA Negative Negative   Protein,UA Negative Negative/Trace   Glucose, UA Negative Negative   Ketones, UA Negative Negative   RBC, UA Negative Negative   Bilirubin, UA Negative Negative   Urobilinogen, Ur 0.2 0.2 - 1.0 mg/dL   Nitrite, UA Negative  Negative   Microscopic Examination See below:   CULTURE, URINE COMPREHENSIVE     Status: None   Collection Time: 09/15/18  4:08 PM   Specimen: Urine   UR  Result Value Ref Range   Urine Culture, Comprehensive Final report    Organism ID, Bacteria Comment     Comment: Mixed urogenital flora 10,000-25,000 colony forming units per mL   Microscopic Examination     Status: None   Collection Time: 09/15/18  4:08 PM   URINE  Result Value Ref Range   WBC, UA 0-5 0 - 5 /hpf   RBC None seen 0 - 2 /hpf   Epithelial Cells (non renal) 0-10 0 - 10 /hpf   Bacteria, UA None seen None seen/Few  Bladder Scan (Post Void Residual) in office     Status: None   Collection Time: 09/15/18  4:16 PM  Result Value Ref Range   Scan Result 0   CBC with Differential     Status: Abnormal   Collection Time: 10/21/18  7:09 PM  Result Value Ref Range   WBC 11.1 (H) 4.0 - 10.5 K/uL   RBC 3.97 3.87 - 5.11 MIL/uL   Hemoglobin 11.8 (L) 12.0 - 15.0 g/dL   HCT 35.7 (L) 36.0 - 46.0 %   MCV 89.9 80.0 - 100.0 fL   MCH 29.7 26.0 - 34.0 pg   MCHC 33.1 30.0 - 36.0 g/dL   RDW 12.4 11.5 - 15.5 %   Platelets 348 150 - 400 K/uL   nRBC 0.0 0.0 - 0.2 %   Neutrophils Relative % 62 %   Neutro Abs 6.9 1.7 - 7.7 K/uL   Lymphocytes Relative 30 %   Lymphs Abs  3.3 0.7 - 4.0 K/uL   Monocytes Relative 5 %   Monocytes Absolute 0.6 0.1 - 1.0 K/uL   Eosinophils Relative 2 %   Eosinophils Absolute 0.2 0.0 - 0.5 K/uL   Basophils Relative 1 %   Basophils Absolute 0.1 0.0 - 0.1 K/uL   Immature Granulocytes 0 %   Abs Immature Granulocytes 0.02 0.00 - 0.07 K/uL    Comment: Performed at [facility], [address] [place], [place] [address]  Comprehensive metabolic panel     Status: Abnormal   Collection Time: 10/21/18  7:09 PM  Result Value Ref Range   Sodium 140 135 - 145 mmol/L   Potassium 3.3 (L) 3.5 - 5.1 mmol/L   Chloride 103 98 - 111 mmol/L   CO2 27 22 - 32 mmol/L   Glucose, Bld 96 70 - 99 mg/dL   BUN 13 8 -  23 mg/dL   Creatinine, Ser 0.88 0.44 - 1.00 mg/dL   Calcium 9.4 8.9 - 10.3 mg/dL   Total Protein 7.5 6.5 - 8.1 g/dL   Albumin 4.2 3.5 - 5.0 g/dL   AST 21 15 - 41 U/L   ALT 13 0 - 44 U/L   Alkaline Phosphatase 65 38 - 126 U/L   Total Bilirubin 0.7 0.3 - 1.2 mg/dL   GFR calc non Af Amer >60 >60 mL/min   GFR calc Af Amer >60 >60 mL/min   Anion gap 10 5 - 15    Comment: Performed at [facility], [place], [place] [address]  Troponin I (High Sensitivity)     Status: None   Collection Time: 10/21/18  7:09 PM  Result Value Ref Range   Troponin I (High Sensitivity) 3 <18 ng/L    Comment: (NOTE) Elevated high sensitivity troponin I (hsTnI) values and significant  changes across serial measurements may suggest ACS but many other  chronic and acute conditions are known to elevate hsTnI results.  Refer to the "Links" section for chest pain algorithms and additional  guidance. Performed at [facility], [address] [place], [place] [address]   Magnesium     Status: None   Collection Time: 10/21/18  7:09 PM  Result Value Ref Range   Magnesium 2.0 1.7 - 2.4 mg/dL    Comment: Performed at [facility], [address] [place], [place] [address]  TSH     Status: None   Collection Time: 10/21/18  7:09 PM  Result Value Ref Range   TSH 0.794 0.350 - 4.500 uIU/mL    Comment: Performed by a 3rd Generation assay with a functional sensitivity of <=0.01 uIU/mL. Performed at [facility], [address] [place],  [address]   Bladder Scan (Post Void Residual) in office     Status: None   Collection Time: 10/28/18  1:52 PM  Result Value Ref Range   Scan Result 178m   Urinalysis, Complete     Status: Abnormal   Collection Time: 10/28/18  2:19 PM  Result Value Ref Range   Specific Gravity, UA <1.005 (L) 1.005 - 1.030   pH, UA 6.0 5.0 - 7.5   Color, UA Yellow Yellow   Appearance Ur Clear Clear   Leukocytes,UA Negative Negative    Protein,UA Negative Negative/Trace   Glucose, UA Negative Negative   Ketones, UA Negative Negative   RBC, UA Trace (A) Negative   Bilirubin, UA Negative Negative   Urobilinogen, Ur 0.2 0.2 - 1.0 mg/dL   Nitrite,  UA Negative Negative   Microscopic Examination See below:   Microscopic Examination     Status: None   Collection Time: 10/28/18  2:19 PM   URINE  Result Value Ref Range   WBC, UA 0-5 0 - 5 /hpf   RBC None seen 0 - 2 /hpf   Epithelial Cells (non renal) None seen 0 - 10 /hpf   Bacteria, UA None seen None seen/Few  Basic metabolic panel     Status: Abnormal   Collection Time: 11/02/18  3:47 PM  Result Value Ref Range   Sodium 139 135 - 145 mmol/L   Potassium 3.3 (L) 3.5 - 5.1 mmol/L   Chloride 102 98 - 111 mmol/L   CO2 30 22 - 32 mmol/L   Glucose, Bld 93 70 - 99 mg/dL   BUN 7 (L) 8 - 23 mg/dL   Creatinine, Ser 0.93 0.44 - 1.00 mg/dL   Calcium 9.3 8.9 - 10.3 mg/dL   GFR calc non Af Amer >60 >60 mL/min   GFR calc Af Amer >60 >60 mL/min   Anion gap 7 5 - 15    Comment: Performed at [facility], [address] [place], [place] [address]  CBC     Status: Abnormal   Collection Time: 11/02/18  3:47 PM  Result Value Ref Range   WBC 8.1 4.0 - 10.5 K/uL   RBC 3.90 3.87 - 5.11 MIL/uL   Hemoglobin 11.6 (L) 12.0 - 15.0 g/dL   HCT 36.0 36.0 - 46.0 %   MCV 92.3 80.0 - 100.0 fL   MCH 29.7 26.0 - 34.0 pg   MCHC 32.2 30.0 - 36.0 g/dL   RDW 12.2 11.5 - 15.5 %   Platelets 349 150 - 400 K/uL   nRBC 0.0 0.0 - 0.2 %    Comment: Performed at [facility], [place], [place] [address]  Troponin I (High Sensitivity)     Status: None   Collection Time: 11/02/18  3:47 PM  Result Value Ref Range   Troponin I (High Sensitivity) <2 <18 ng/L    Comment: (NOTE) Elevated high sensitivity troponin I (hsTnI) values and significant  changes across serial measurements may suggest ACS but many other  chronic and acute conditions are known to elevate hsTnI  results.  Refer to the "Links" section for chest pain algorithms and additional  guidance. Performed at [facility], [address] [place], [place] [address]   Comprehensive metabolic panel     Status: Abnormal   Collection Time: 11/20/18 10:18 AM  Result Value Ref Range   Sodium 138 135 - 145 mmol/L   Potassium 3.9 3.5 - 5.1 mmol/L   Chloride 102 98 - 111 mmol/L   CO2 25 22 - 32 mmol/L   Glucose, Bld 100 (H) 70 - 99 mg/dL   BUN 6 (L) 8 - 23 mg/dL   Creatinine, Ser 0.82 0.44 - 1.00 mg/dL   Calcium 9.5 8.9 - 10.3 mg/dL   Total Protein 8.1 6.5 - 8.1 g/dL   Albumin 4.1 3.5 - 5.0 g/dL   AST 20 15 - 41 U/L   ALT 12 0 - 44 U/L   Alkaline Phosphatase 65 38 - 126 U/L   Total Bilirubin 0.5 0.3 - 1.2 mg/dL   GFR calc non Af Amer >60 >60 mL/min   GFR calc Af Amer >60 >60 mL/min   Anion gap 11 5 - 15    Comment: Performed at [facility], [address] [place], [place] [address]  ANA w/Reflex if Positive     Status: None   Collection Time: 11/20/18 10:18 AM  Result Value Ref Range   Anti Nuclear Antibody (ANA) Negative Negative    Comment: (NOTE) Performed At: [facility] [place], [place] [id number] Rush Farmer MD [id number]   C3 complement     Status: None   Collection Time: 11/20/18 10:18 AM  Result Value Ref Range   C3 Complement 124 82 - 167 mg/dL    Comment: (NOTE) Performed At: [facility] [place], [place] [id number] Rush Farmer MD [id number]   C4 complement     Status: None   Collection Time: 11/20/18 10:18 AM  Result Value Ref Range   Complement C4, Body Fluid 38 14 - 44 mg/dL    Comment: (NOTE) Performed At: [facility] [place], [place] [id number] Rush Farmer MD [id number]   Glomerular basement membrane antibodies     Status: None   Collection Time: 11/20/18 10:18 AM  Result Value Ref Range   GBM Ab 10 0 - 20 units    Comment: (NOTE)                    Negative                   0 - 20                   Weak Positive             21 - 30                   Moderate to Strong Positive   >30 Performed At: [facility] [place], [place] [id number] Rush Farmer MD [id number]   Miscellaneous [facility] test (send-out)     Status: None   Collection Time: 11/20/18 10:18 AM  Result Value Ref Range   [facility] test code [id number]    [facility] test name MPO     Comment: Performed at [facility], [address] [place], [place] [address]   Misc [facility] result COMMENT     Comment: (NOTE) Test Ordered: [id number] Myeloperoxidase (MPO) Myeloperoxidase (MPO)          7792        [H ] pmol/L   BN     Reference Range: 0-469                                 **Results verified by repeat testing** Results of this test are labeled for research purposes only by the assay's manufacturer. The performance characteristics of this assay have not been established by the manufacturer. The result should not be used for treatment or for diagnostic purposes without confirmation of the diagnosis by another medically established diagnostic product or procedure. The performance characteristics were determined by [facility].                            Low CVD Risk         <470                            Moderate Risk   470 - 539  High Risk            >539 Performed At: [facility] [place], [place] [id number] Rush Farmer MD [id number]   Miscellaneous [facility] test (send-out)     Status: None   Collection Time: 11/20/18 10:18 AM  Result Value Ref Range   [facility] test code [id number]    [facility] test name ANTIPROTEINASE PR3     Comment: Performed at [facility], [address], [place], [place] [address]   Misc [facility] result COMMENT     Comment: (NOTE) Test Ordered: [id number] Antiproteinase 3 (PR-3) Abs Antiproteinase 3 (PR-3) Abs    8.6         [facility] ] U/mL     BN      Reference Range: 0.0-3.5                               Performed At: [facility] [address] [place], [place] [id number] Rush Farmer MD [id number]   Urinalysis, Complete     Status: None   Collection Time: 12/02/18 10:53 AM  Result Value Ref Range   Specific Gravity, UA 1.020 1.005 - 1.030   pH, UA 7.0 5.0 - 7.5   Color, UA Yellow Yellow   Appearance Ur Clear Clear   Leukocytes,UA Negative Negative   Protein,UA Negative Negative/Trace   Glucose, UA Negative Negative   Ketones, UA Negative Negative   RBC, UA Negative Negative   Bilirubin, UA Negative Negative   Urobilinogen, Ur 0.2 0.2 - 1.0 mg/dL   Nitrite, UA Negative Negative   Microscopic Examination See below:   Microscopic Examination     Status: None   Collection Time: 12/02/18 10:53 AM   URINE  Result Value Ref Range   WBC, UA 0-5 0 - 5 /hpf   RBC 0-2 0 - 2 /hpf   Epithelial Cells (non renal) 0-10 0 - 10 /hpf   Bacteria, UA None seen None seen/Few  Bladder Scan (Post Void Residual) in office     Status: None   Collection Time: 12/02/18 11:08 AM  Result Value Ref Range   Scan Result 0     Radiology: No results found.  No results found.  No results found.    Assessment and Plan: Patient Active Problem List   Diagnosis Date Noted  . Bunion 05/06/2018  . Metatarsalgia of right foot 05/06/2018  . Age-related osteoporosis without current pathological fracture 03/31/2018  . False positive ana 03/31/2018  . Mouth dryness 02/18/2018  . Screening for osteoporosis 02/18/2018  . 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 Compression fracture of thoracic vertebra, sequela 07/12/2017  . Lumbar compression fracture, sequela 07/12/2017  . Nocturnal hypoxia 07/12/2017  . Simple chronic bronchitis ([place]) 07/12/2017  . Intractable back pain 06/05/2017  . Overweight (BMI 25.0-29.9) 04/08/2017  . Chronic pain  syndrome 04/06/2017  . Sensory disturbance 01/08/2017  . Pain in the groin, right 10/28/2016  . Paresthesia of both hands 12/14/2015  . B12 deficiency 10/31/2015  . SOB (shortness of breath) 08/29/2015  . History of hematuria 07/12/2015  . Urinary frequency 07/12/2015  . Vaginal atrophy 07/12/2015  . HAV (hallux abducto valgus) 07/02/2015  . Overlapping toe 07/02/2015  . Hammertoe 07/02/2015  . LBP (low back pain) 01/17/2015  . Trochanteric bursitis of right hip 01/17/2015  . PNA (pneumonia) 12/28/2014  . DDD (degenerative  disc disease), lumbar 08/15/2014  . Lumbar radicular syndrome 08/15/2014  . 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 DDD (degenerative disc disease), cervical 08/15/2014  . Bilateral occipital neuralgia 08/15/2014  . Migraine 08/15/2014  . Sacroiliac joint dysfunction 08/15/2014  . Back pain, chronic 09/23/2013  . Chronic cervical pain 09/23/2013  . Arthralgia of temporomandibular joint 08/29/2013  . Absolute anemia 08/15/2013  . Chronic kidney disease (CKD), stage III (moderate) ([place]) 08/15/2013  . Abnormal serum level of alkaline phosphatase 08/15/2013  . Blush 08/15/2013  . Hematuria, microscopic 08/15/2013  . Inflamed nasal mucosa 08/15/2013  . Big thyroid 08/15/2013  . Disease of thyroid gland 08/15/2013  . Positive H. pylori test 08/15/2013  . Gastrointestinal ulcer due to Helicobacter pylori 63/03/6008  . Other specified bacterial intestinal infections 08/14/2013  . Kidney failure 08/09/2013  . SIRS (systemic inflammatory response syndrome) ([place]) 08/09/2013  . Systemic inflammatory response syndrome (SIRS) ([place]) 08/09/2013    1. Obstructive chronic bronchitis without exacerbation (HCC) Stable, continue present therapy.   2. Mild intermittent asthma without complication Stable, continue to use inhalers as directed.   3. Oxygen dependent Pt continues to require oxygen at night, 2 lpm.   4. Nocturnal  hypoxia Continue to wear oxygen at night as prescribed.   General Counseling: I have discussed the findings of the evaluation and examination with Kiki.  I have also discussed any further diagnostic evaluation thatmay be needed or ordered today. Ladavia verbalizes understanding of the findings of todays visit. We also reviewed her medications today and discussed drug interactions and side effects including but not limited excessive drowsiness and altered mental states. We also discussed that there is always a risk not just to her but also people around her. she has been encouraged to call the office with any questions or concerns that should arise related to todays visit.    Time spent: 15 This patient was seen by Orson Gear AGNP-C in Collaboration with Dr. Devona Konig as a part of collaborative care agreement.   I have personally obtained a history, examined the patient, evaluated laboratory and imaging results, formulated the assessment and plan and placed orders.    Allyne Gee, MD [facility] Pulmonary and Critical Care Sleep medicine

## 2018-12-11 ENCOUNTER — Ambulatory Visit: Payer: PRIVATE HEALTH INSURANCE

## 2018-12-15 ENCOUNTER — Other Ambulatory Visit: Payer: Self-pay

## 2018-12-15 ENCOUNTER — Telehealth: Payer: Self-pay

## 2018-12-15 DIAGNOSIS — Z1211 Encounter for screening for malignant neoplasm of colon: Secondary | ICD-10-CM

## 2018-12-15 MED ORDER — NA SULFATE-K SULFATE-MG SULF 17.5-3.13-1.6 GM/177ML PO SOLN: 1.0000 | Freq: Once | ORAL | 0 refills | Status: AC

## 2018-12-15 NOTE — Procedures (Signed)
[facility] [place], [place] [address]  Sleep Specialist: Allyne Gee, MD [place] Sleep Study Interpretation  Patient Name: Rachael Jordan Patient MR GNOIBB:[id number] DOB:13-Oct-1955  Date of Study: December 02, 2018  Indications for study: Hypersomnia excessive daytime somnolence  BMI: 25.8 kg/m       Respiratory Data:  Total AHI: 3.7/h  Total Obstructive Apneas: 12  Total Central Apneas: 1  Total Mixed Apneas: 0  Total Hypopneas: 8  If the AHI is greater than 5 per hour patient qualifies for PAP evaluation  Oximetry Data:  Oxygen Desaturation Index: 3/h  Lowest Desaturation: 87%  Cardiac Data:  Minimum Heart Rate: 57  Maximum Heart Rate: 96   Impression / Diagnosis:  This apnea study does not show significant obstructive sleep apnea however the patient does have significant oxygen desaturation and may warrant nocturnal oxygen supplementation.  Clinical correlation is strongly recommended.  GENERAL Recommendations:  1.  Consider Auto PAP with pressure ranges 5-20 cmH20 with download, or facility based PAP Titration Study  2.  Consider PAP interface mask fitted for patient comfort, Heated Humidification & PAP compliance monitoring (1 month, 3 months & 12 months after PAP initiation)  3. Consider treatment with mandibular advancement splint (MAS) or referral to an ENT surgeon for modification to the upper airway if the patient prefers an alternate therapy or the PAP trial is unsuccessful  4. Sleep hygiene measures should be discussed with the patient  5. Behavioral therapy such as weight reduction or smoking cessation as appropriate for the patient  6. Advise patient against the use of alcohol or sedatives in so much as these substances can worsen excessive daytime sleepiness and respiratory disturbances of sleep  7. Advise patient against participating in potentially dangerous activities while drowsy such as operating  a motor vehicle, heavy equipment or power tools as it can put them and others in danger  8. Advise patient of the long term consequences of OSA if left untreated, need for treatment and close follow up  9. Clinical follow up as deemed necessary     This Level III home sleep study was performed using the [employer], a 4 channel screening device subject to limitations. Depending on actual total sleep time, not measured in this study, the AHI (sum of apneas and hypopneas/hr of sleep) and therefore the severity of sleep apnea may be underestimated. As with any single night study, including Level 1 attended PSG, severity of sleep apnea may also be underestimated due to the lack of supine and/or REM sleep.  The interpretation associated with this report is based on normal values and degrees of severity in accordance with AASM parameters and/or estimated from multiple sources in the literature for adults ages 19-80+. These may not agree with the displayed values. The patient's treating physician should use the interpretation and recommendations in conjunction with the overall clinical evaluation and treatment of the patient.  Some of the terminology used in this scored [facility]nea[facility] report was developed several years ago and may not always be in accordance with current nomenclature. This in no way affects the accuracy of the data or the reliability of the interpretation and recommendations.

## 2018-12-15 NOTE — Telephone Encounter (Signed)
Gastroenterology Pre-Procedure Review  Request Date: 12/29/2018 Requesting Physician: Dr. Allen Norris  PATIENT REVIEW QUESTIONS: The patient responded to the following health history questions as indicated:    1. Are you having any GI issues? no 2. Do you have a personal history of Polyps? yes (unsure of the year ) 3. Do you have a family history of Colon Cancer or Polyps? no 4. Diabetes Mellitus? no 5. Joint replacements in the past 12 months?no 6. Major health problems in the past 3 months?no 7. Any artificial heart valves, MVP, or defibrillator?no    MEDICATIONS & ALLERGIES:    Patient reports the following regarding taking any anticoagulation/antiplatelet therapy:   Plavix, Coumadin, Eliquis, Xarelto, Lovenox, Pradaxa, Brilinta, or Effient? no Aspirin? no  Patient confirms/reports the following medications:  Current Outpatient Medications  Medication Sig Dispense Refill  . albuterol (VENTOLIN HFA) 108 (90 Base) MCG/ACT inhaler Inhale 1 puff into the lungs every 6 (six) hours as needed for wheezing or shortness of breath.    Marland Kitchen aspirin EC 81 MG tablet Take by mouth.    Marland Kitchen atorvastatin (LIPITOR) 20 MG tablet Take by mouth.    Marland Kitchen azelastine (ASTELIN) 0.1 % nasal spray Place 1 spray into the nose 2 (two) times daily.     . Biotin 10000 MCG TABS Take 1 tablet by mouth daily.    . budesonide-formoterol (SYMBICORT) 80-4.5 MCG/ACT inhaler Inhale 2 puffs into the lungs 2 (two) times daily. 1 Inhaler 3  . Calcium Carbonate-Vitamin D3 600-400 MG-UNIT TABS Take 1 tablet by mouth daily.     . celecoxib (CELEBREX) 200 MG capsule Take 200 mg by mouth 2 (two) times daily.    . cetirizine (ZYRTEC) 10 MG tablet Take 10 mg by mouth at bedtime. Reported on 08/01/2015    . Cholecalciferol (VITAMIN D-1000 MAX ST) 25 MCG (1000 UT) tablet Take by mouth.    . diclofenac sodium (VOLTAREN) 1 % GEL Apply 2-4 g topically 4 (four) times daily.    Marland Kitchen docusate sodium (COLACE) 100 MG capsule Take 100 mg by mouth daily as  needed for mild constipation.     . DULoxetine (CYMBALTA) 30 MG capsule Take 30 mg by mouth daily.     Marland Kitchen gabapentin (NEURONTIN) 800 MG tablet Take by mouth.    . hydrocortisone 2.5 % ointment Apply topically.    Marland Kitchen ipratropium-albuterol (DUONEB) 0.5-2.5 (3) MG/3ML SOLN Take 3 mLs by nebulization every 4 (four) hours as needed. 120 mL 0  . linaclotide (LINZESS) 72 MCG capsule Take by mouth.    . metoCLOPramide (REGLAN) 10 MG tablet Take 1 tablet (10 mg total) by mouth every 6 (six) hours as needed. 30 tablet 0  . montelukast (SINGULAIR) 10 MG tablet Take 10 mg by mouth daily.    . Multiple Vitamins-Minerals (CENTRUM ADULTS PO) Take by mouth. Reported on 09/28/2015    . nabumetone (RELAFEN) 750 MG tablet Take 750 mg by mouth 2 (two) times daily as needed.    Marland Kitchen olmesartan (BENICAR) 5 MG tablet Take 5 mg by mouth 2 (two) times daily.     . Oxycodone HCl 10 MG TABS Take 5-10 mg by mouth See admin instructions. Take 5-10 mg 3-5 times daily if tolerated    . promethazine (PHENERGAN) 25 MG tablet Take 25 mg by mouth every 6 (six) hours as needed for nausea or vomiting.    . rizatriptan (MAXALT) 10 MG tablet Take 10 mg by mouth as needed for migraine. May repeat in 2 hours if needed    .  scopolamine (TRANSDERM-SCOP, 1.5 MG,) 1 MG/3DAYS Place 1 patch (1.5 mg total) onto the skin every 3 (three) days. 4 patch 1  . tiZANidine (ZANAFLEX) 4 MG tablet Take 4 mg by mouth 4 (four) times daily.     . traZODone (DESYREL) 100 MG tablet Take 100 mg by mouth at bedtime.    . trospium (SANCTURA) 20 MG tablet Take by mouth.    . valACYclovir (VALTREX) 1000 MG tablet Take 1,000 mg by mouth as needed.     . venlafaxine XR (EFFEXOR-XR) 37.5 MG 24 hr capsule Take 37.5 mg by mouth daily.     . vitamin C (ASCORBIC ACID) 500 MG tablet Take 500 mg by mouth daily.     No current facility-administered medications for this visit.     Patient confirms/reports the following allergies:  Allergies  Allergen Reactions  .  Grapefruit Bioflavonoid Complex Rash  . Grapefruit Extract Rash  . Propoxyphene Nausea And Vomiting and Nausea Only  . Prednisolone     Psychotic features    No orders of the defined types were placed in this encounter.   AUTHORIZATION INFORMATION Primary Insurance: 1D#: Group #:  Secondary Insurance: 1D#: Group #:  SCHEDULE INFORMATION: Date: 12/29/2018 Time: Location: [place]

## 2018-12-18 ENCOUNTER — Ambulatory Visit: Payer: PRIVATE HEALTH INSURANCE

## 2018-12-25 ENCOUNTER — Ambulatory Visit: Payer: PRIVATE HEALTH INSURANCE

## 2018-12-25 ENCOUNTER — Other Ambulatory Visit
Admission: RE | Admit: 2018-12-25 | Discharge: 2018-12-25 | Disposition: A | Discharge status: Home / Self Care | Payer: PRIVATE HEALTH INSURANCE | Source: Ambulatory Visit | Attending: Gastroenterology | Admitting: Gastroenterology

## 2018-12-25 ENCOUNTER — Other Ambulatory Visit: Payer: Self-pay

## 2018-12-25 DIAGNOSIS — Z01812 Encounter for preprocedural laboratory examination: Secondary | ICD-10-CM | POA: Insufficient documentation

## 2018-12-25 DIAGNOSIS — Z20828 Contact with and (suspected) exposure to other viral communicable diseases: Secondary | ICD-10-CM | POA: Diagnosis not present

## 2018-12-25 LAB — SARS CORONAVIRUS 2 (TAT 6-24 HRS): SARS Coronavirus 2: NEGATIVE

## 2018-12-28 ENCOUNTER — Encounter
Admission: RE | Admit: 2018-12-28 | Discharge: 2018-12-28 | Disposition: A | Discharge status: Home / Self Care | Payer: PRIVATE HEALTH INSURANCE | Source: Ambulatory Visit | Attending: Nephrology | Admitting: Nephrology

## 2018-12-28 ENCOUNTER — Other Ambulatory Visit: Payer: Self-pay

## 2018-12-28 ENCOUNTER — Encounter: Payer: Self-pay | Admitting: *Deleted

## 2018-12-28 DIAGNOSIS — Z01812 Encounter for preprocedural laboratory examination: Secondary | ICD-10-CM | POA: Diagnosis present

## 2018-12-28 LAB — COMPREHENSIVE METABOLIC PANEL
ALT: 15 U/L (ref 0–44)
AST: 20 U/L (ref 15–41)
Albumin: 4 g/dL (ref 3.5–5.0)
Alkaline Phosphatase: 62 U/L (ref 38–126)
Anion gap: 7 (ref 5–15)
BUN: 13 mg/dL (ref 8–23)
CO2: 29 mmol/L (ref 22–32)
Calcium: 9.2 mg/dL (ref 8.9–10.3)
Chloride: 102 mmol/L (ref 98–111)
Creatinine, Ser: 1.12 mg/dL — ABNORMAL HIGH (ref 0.44–1.00)
GFR calc Af Amer: >60 mL/min (ref >60)
GFR calc non Af Amer: 53 mL/min — ABNORMAL LOW (ref >60)
Glucose, Bld: 92 mg/dL (ref 70–99)
Potassium: 4 mmol/L (ref 3.5–5.1)
Sodium: 138 mmol/L (ref 135–145)
Total Bilirubin: 0.9 mg/dL (ref 0.3–1.2)
Total Protein: 7.5 g/dL (ref 6.5–8.1)

## 2018-12-28 LAB — URINALYSIS, ROUTINE W REFLEX MICROSCOPIC
Bacteria, UA: NONE SEEN
Bilirubin Urine: NEGATIVE
Glucose, UA: NEGATIVE mg/dL
Hgb urine dipstick: NEGATIVE
Ketones, ur: NEGATIVE mg/dL
Leukocytes,Ua: NEGATIVE
Nitrite: NEGATIVE
Protein, ur: NEGATIVE mg/dL
Specific Gravity, Urine: 1.012 (ref 1.005–1.030)
Squamous Epithelial / HPF: NONE SEEN (ref 0–5)
pH: 6 (ref 5.0–8.0)

## 2018-12-28 LAB — CBC
HCT: 34.8 % — ABNORMAL LOW (ref 36.0–46.0)
Hemoglobin: 11.2 g/dL — ABNORMAL LOW (ref 12.0–15.0)
MCH: 29.3 pg (ref 26.0–34.0)
MCHC: 32.2 g/dL (ref 30.0–36.0)
MCV: 91.1 fL (ref 80.0–100.0)
Platelets: 352 10*3/uL (ref 150–400)
RBC: 3.82 MIL/uL — ABNORMAL LOW (ref 3.87–5.11)
RDW: 13 % (ref 11.5–15.5)
WBC: 7.5 10*3/uL (ref 4.0–10.5)
nRBC: 0 % (ref 0.0–0.2)

## 2018-12-28 LAB — PROTIME-INR
INR: 0.9 (ref 0.8–1.2)
Prothrombin Time: 12.4 seconds (ref 11.4–15.2)

## 2018-12-28 LAB — PROTEIN / CREATININE RATIO, URINE
Creatinine, Urine: 157 mg/dL
Protein Creatinine Ratio: 0.05 mg/mg{Cre} (ref 0.00–0.15)
Total Protein, Urine: 8 mg/dL

## 2018-12-28 NOTE — Pre-Procedure Instructions (Signed)
Secure chat notification to Dr Holley Raring of positive antibodies in pt's T&S

## 2018-12-29 ENCOUNTER — Ambulatory Visit: Payer: PRIVATE HEALTH INSURANCE | Admitting: Registered Nurse

## 2018-12-29 ENCOUNTER — Ambulatory Visit
Admission: RE | Admit: 2018-12-29 | Discharge: 2018-12-29 | Disposition: A | Discharge status: Home / Self Care | Payer: PRIVATE HEALTH INSURANCE | Attending: Gastroenterology | Admitting: Gastroenterology

## 2018-12-29 ENCOUNTER — Encounter
Admission: RE | Disposition: A | Discharge status: Home / Self Care | Payer: Self-pay | Source: Home / Self Care | Attending: Gastroenterology

## 2018-12-29 DIAGNOSIS — Z1211 Encounter for screening for malignant neoplasm of colon: Secondary | ICD-10-CM | POA: Insufficient documentation

## 2018-12-29 DIAGNOSIS — I10 Essential (primary) hypertension: Secondary | ICD-10-CM | POA: Insufficient documentation

## 2018-12-29 DIAGNOSIS — Z7982 Long term (current) use of aspirin: Secondary | ICD-10-CM | POA: Diagnosis not present

## 2018-12-29 DIAGNOSIS — Z7951 Long term (current) use of inhaled steroids: Secondary | ICD-10-CM | POA: Insufficient documentation

## 2018-12-29 DIAGNOSIS — Z886 Allergy status to analgesic agent status: Secondary | ICD-10-CM | POA: Diagnosis not present

## 2018-12-29 DIAGNOSIS — Z791 Long term (current) use of non-steroidal anti-inflammatories (NSAID): Secondary | ICD-10-CM | POA: Diagnosis not present

## 2018-12-29 DIAGNOSIS — F419 Anxiety disorder, unspecified: Secondary | ICD-10-CM | POA: Insufficient documentation

## 2018-12-29 DIAGNOSIS — Z888 Allergy status to other drugs, medicaments and biological substances status: Secondary | ICD-10-CM | POA: Insufficient documentation

## 2018-12-29 DIAGNOSIS — M1990 Unspecified osteoarthritis, unspecified site: Secondary | ICD-10-CM | POA: Diagnosis not present

## 2018-12-29 HISTORY — PX: COLONOSCOPY WITH PROPOFOL: SHX5780

## 2018-12-29 SURGERY — COLONOSCOPY WITH PROPOFOL
Anesthesia: General

## 2018-12-29 MED ORDER — PROPOFOL 10 MG/ML IV BOLUS
INTRAVENOUS | Status: DC | PRN
  Administered 2018-12-29: 70 mg via INTRAVENOUS
  Administered 2018-12-29: 30 mg via INTRAVENOUS

## 2018-12-29 MED ORDER — SODIUM CHLORIDE 0.9 % IV SOLN
INTRAVENOUS | Status: DC
  Administered 2018-12-29: 1000 mL via INTRAVENOUS

## 2018-12-29 MED ORDER — PROPOFOL 500 MG/50ML IV EMUL
INTRAVENOUS | Status: DC | PRN
  Administered 2018-12-29: 150 ug/kg/min via INTRAVENOUS

## 2018-12-29 MED ORDER — LIDOCAINE HCL (CARDIAC) PF 100 MG/5ML IV SOSY
PREFILLED_SYRINGE | INTRAVENOUS | Status: DC | PRN
  Administered 2018-12-29: 60 mg via INTRAVENOUS

## 2018-12-29 NOTE — H&P (Signed)
Rachael Lame, MD [address] [place], [place] [address] Phone: [phone] Fax : [phone]  Primary Care Physician:  Sharyne Peach, MD Primary Gastroenterologist:  Dr. Allen Norris  Pre-Procedure History & Physical: HPI:  Rachael Jordan is a 63 y.o. female is here for a screening colonoscopy.   Past Medical History:  Diagnosis Date   Acid reflux    Anxiety    Arthritis    Bronchitis 09/2015   Gross hematuria    H. pylori infection    Heart murmur    Kidney failure    Labile essential hypertension 01/06/2018   Lung abnormality    "damaged lung due to pneumonia"   Neck pain    Nephrolithiasis    Pneumonia    Pneumothorax    Scoliosis    Urinary frequency    Vaginal atrophy     Past Surgical History:  Procedure Laterality Date   ABDOMINAL HYSTERECTOMY     vaginal   APPENDECTOMY     FOOT SURGERY Left    10/2015   NECK SURGERY      Prior to Admission medications   Medication Sig Start Date End Date Taking? Authorizing Provider  albuterol (VENTOLIN HFA) 108 (90 Base) MCG/ACT inhaler Inhale 1 puff into the lungs every 6 (six) hours as needed for wheezing or shortness of breath.   Yes [provider]  aspirin EC 81 MG tablet Take by mouth.   Yes [provider]  atorvastatin (LIPITOR) 20 MG tablet Take by mouth. 01/01/18 01/01/19 Yes [provider]  Biotin 10000 MCG TABS Take 1 tablet by mouth daily.   Yes [provider]  budesonide-formoterol (SYMBICORT) 80-4.5 MCG/ACT inhaler Inhale 2 puffs into the lungs 2 (two) times daily. 11/09/18  Yes Allyne Gee, MD  Calcium Carbonate-Vitamin D3 600-400 MG-UNIT TABS Take 1 tablet by mouth daily.    Yes [provider]  cetirizine (ZYRTEC) 10 MG tablet Take 10 mg by mouth at bedtime. Reported on 08/01/2015   Yes [provider]  Cholecalciferol (VITAMIN D-1000 MAX ST) 25 MCG (1000 UT) tablet Take by mouth.   Yes [provider]   diclofenac sodium (VOLTAREN) 1 % GEL Apply 2-4 g topically 4 (four) times daily.   Yes [provider]  docusate sodium (COLACE) 100 MG capsule Take 100 mg by mouth daily as needed for mild constipation.    Yes [provider]  DULoxetine (CYMBALTA) 30 MG capsule Take 30 mg by mouth daily.  09/01/18  Yes [provider]  hydrocortisone 2.5 % ointment Apply topically. 09/29/18 09/29/19 Yes [provider]  ipratropium-albuterol (DUONEB) 0.5-2.5 (3) MG/3ML SOLN Take 3 mLs by nebulization every 4 (four) hours as needed. 01/13/15  Yes Triplett, Cari B, FNP  metoCLOPramide (REGLAN) 10 MG tablet Take 1 tablet (10 mg total) by mouth every 6 (six) hours as needed. 12/29/17  Yes Carrie Mew, MD  montelukast (SINGULAIR) 10 MG tablet Take 10 mg by mouth daily.   Yes [provider]  Multiple Vitamins-Minerals (CENTRUM ADULTS PO) Take by mouth. Reported on 09/28/2015   Yes [provider]  nabumetone (RELAFEN) 750 MG tablet Take 750 mg by mouth 2 (two) times daily as needed.   Yes [provider]  olmesartan (BENICAR) 5 MG tablet Take 5 mg by mouth 2 (two) times daily.    Yes [provider]  Oxycodone HCl 10 MG TABS Take 5-10 mg by mouth See admin instructions. Take 5-10 mg 3-5 times daily  if tolerated   Yes [provider]  promethazine (PHENERGAN) 25 MG tablet Take 25 mg by mouth every 6 (six) hours as needed for nausea or vomiting.   Yes [provider]  rizatriptan (MAXALT) 10 MG tablet Take 10 mg by mouth as needed for migraine. May repeat in 2 hours if needed   Yes [provider]  scopolamine (TRANSDERM-SCOP, 1.5 MG,) 1 MG/3DAYS Place 1 patch (1.5 mg total) onto the skin every 3 (three) days. 12/29/17 12/29/18 Yes Carrie Mew, MD  tiZANidine (ZANAFLEX) 4 MG tablet Take 4 mg by mouth 4 (four) times daily.    Yes [provider]  traZODone (DESYREL) 100 MG tablet Take 100 mg by mouth at  bedtime.   Yes [provider]  trospium (SANCTURA) 20 MG tablet Take by mouth. 08/28/18 08/28/19 Yes [provider]  valACYclovir (VALTREX) 1000 MG tablet Take 1,000 mg by mouth as needed.  06/30/18 04/12/19 Yes [provider]  venlafaxine XR (EFFEXOR-XR) 37.5 MG 24 hr capsule Take 37.5 mg by mouth daily.  06/19/18 06/19/19 Yes [provider]  vitamin C (ASCORBIC ACID) 500 MG tablet Take 500 mg by mouth daily.   Yes [provider]  azelastine (ASTELIN) 0.1 % nasal spray Place 1 spray into the nose 2 (two) times daily.  07/05/15 09/29/17  [provider]  celecoxib (CELEBREX) 200 MG capsule Take 200 mg by mouth 2 (two) times daily.    [provider]  gabapentin (NEURONTIN) 800 MG tablet Take by mouth. 02/20/17 02/20/18  [provider]  linaclotide Rolan Lipa) 72 MCG capsule Take by mouth. 06/13/17 06/14/18  [provider]    Allergies as of 12/15/2018 - Review Complete 12/08/2018  Allergen Reaction Noted   Grapefruit bioflavonoid complex Rash 12/28/2014   Grapefruit extract Rash 08/13/2013   Propoxyphene Nausea And Vomiting and Nausea Only 12/28/2014   Prednisolone  03/23/2017    Family History  Problem Relation Age of Onset   Arthritis Mother    Asthma Mother    Hyperlipidemia Mother    Hypertension Mother    Diabetes Sister    Cancer Maternal Aunt        esophagus   Breast cancer Neg Hx    Kidney cancer Neg Hx    Bladder Cancer Neg Hx     Social History   Socioeconomic History   Marital status: Legally Separated    Spouse name: Not on file   Number of children: Not on file   Years of education: Not on file   Highest education level: Not on file  Occupational History   Not on file  Social Needs   Financial resource strain: Not on file   Food insecurity    Worry: Not on file    Inability: Not on file   Transportation needs    Medical: Not on file    Non-medical: Not on file    Tobacco Use   Smoking status: Never Smoker   Smokeless tobacco: Never Used  Substance and Sexual Activity   Alcohol use: No    Alcohol/week: 0.0 standard drinks   Drug use: No   Sexual activity: Not on file  Lifestyle   Physical activity    Days per week: Not on file    Minutes per session: Not on file   Stress: Not on file  Relationships   Social connections    Talks on phone: Not on file    Gets together: Not on file  Attends religious service: Not on file    Active member of club or organization: Not on file    Attends meetings of clubs or organizations: Not on file    Relationship status: Not on file   Intimate partner violence    Fear of current or ex partner: Not on file    Emotionally abused: Not on file    Physically abused: Not on file    Forced sexual activity: Not on file  Other Topics Concern   Not on file  Social History Narrative   Not on file    Review of Systems: See HPI, otherwise negative ROS  Physical Exam: BP (!) 156/104    Pulse 65    Temp (!) 96.1 F (35.6 C) (Tympanic)    Resp 20    Ht 5' 5.5" (1.664 m)    Wt 71.7 kg    SpO2 100%    BMI 25.89 kg/m  General:   Alert,  pleasant and cooperative in NAD Head:  Normocephalic and atraumatic. Neck:  Supple; no masses or thyromegaly. Lungs:  Clear throughout to auscultation.    Heart:  Regular rate and rhythm. Abdomen:  Soft, nontender and nondistended. Normal bowel sounds, without guarding, and without rebound.   Neurologic:  Alert and  oriented x4;  grossly normal neurologically.  Impression/Plan: Rachael Jordan is now here to undergo a screening colonoscopy.  Risks, benefits, and alternatives regarding colonoscopy have been reviewed with the patient.  Questions have been answered.  All parties agreeable.

## 2018-12-29 NOTE — Transfer of Care (Signed)
Immediate Anesthesia Transfer of Care Note  Patient: Rachael Jordan  Procedure(s) Performed: COLONOSCOPY WITH PROPOFOL (N/A )  Patient Location: PACU  Anesthesia Type:General  Level of Consciousness: awake, alert  and oriented  Airway & Oxygen Therapy: Patient Spontanous Breathing and Patient connected to nasal cannula oxygen  Post-op Assessment: Report given to RN and Post -op Vital signs reviewed and stable  Post vital signs: Reviewed and stable  Last Vitals:  Vitals Value Taken Time  BP 156/89 12/29/18 1106  Temp 36.4 C 12/29/18 1106  Pulse    Resp 20 12/29/18 1106  SpO2 100 % 12/29/18 1106    Last Pain:  Vitals:   12/29/18 1106  TempSrc: Oral  PainSc:          Complications: No apparent anesthesia complications

## 2018-12-29 NOTE — Op Note (Signed)
[facility] Gastroenterology Patient Name: Rachael Jordan Procedure Date: 12/29/2018 10:35 AM MRN: [id number] Account #: [id number] Date of Birth: [birth year] Admit Type: Outpatient Age: 63 Room: [facility] ENDO ROOM 4 Gender: Female Note Status: Finalized Procedure:            Colonoscopy Indications:          Screening for colorectal malignant neoplasm Providers:            Lucilla Lame MD, MD Medicines:            Propofol per Anesthesia Complications:        No immediate complications. Procedure:            Pre-Anesthesia Assessment:                       - Prior to the procedure, a History and Physical was                        performed, and patient medications and allergies were                        reviewed. The patient's tolerance of previous                        anesthesia was also reviewed. The risks and benefits of                        the procedure and the sedation options and risks were                        discussed with the patient. All questions were                        answered, and informed consent was obtained. Prior                        Anticoagulants: The patient has taken no previous                        anticoagulant or antiplatelet agents. ASA Grade                        Assessment: II - A patient with mild systemic disease.                        After reviewing the risks and benefits, the patient was                        deemed in satisfactory condition to undergo the                        procedure.                       After obtaining informed consent, the colonoscope was                        passed under direct vision. Throughout the procedure,                        the patient's blood pressure,  pulse, and oxygen                        saturations were monitored continuously. The                        Colonoscope was introduced through the anus and                        advanced to the the cecum, identified by  appendiceal                        orifice and ileocecal valve. The colonoscopy was                        performed without difficulty. The patient tolerated the                        procedure well. The quality of the bowel preparation                        was excellent. Findings:      The perianal and digital rectal examinations were normal.      The colon (entire examined portion) appeared normal. Impression:           - The entire examined colon is normal.                       - No specimens collected. Recommendation:       - Discharge patient to home.                       - Resume previous diet.                       - Continue present medications.                       - Repeat colonoscopy in 10 years for screening unless                        any change in family history or lower GI problems. Procedure Code(s):    --- Professional ---                       [phone], Colonoscopy, flexible; diagnostic, including                        collection of specimen(s) by brushing or washing, when                        performed (separate procedure) Diagnosis Code(s):    --- Professional ---                       Z12.11, Encounter for screening for malignant neoplasm                        of colon CPT copyright 2019 American Medical Association. All rights reserved. The codes documented in this report are preliminary and upon coder review may  be revised to meet current compliance requirements. Lucilla Lame MD, MD 12/29/2018 11:02:01 AM This report has  been signed electronically. Number of Addenda: 0 Note Initiated On: 12/29/2018 10:35 AM Scope Withdrawal Time: 0 hours 7 minutes 48 seconds  Total Procedure Duration: 0 hours 12 minutes 58 seconds  Estimated Blood Loss: Estimated blood loss: none.      [facility]

## 2018-12-29 NOTE — Anesthesia Post-op Follow-up Note (Signed)
Anesthesia QCDR form completed.        

## 2018-12-29 NOTE — Anesthesia Preprocedure Evaluation (Signed)
Anesthesia Evaluation  Patient identified by MRN, date of birth, ID band Patient awake    Reviewed: Allergy & Precautions, NPO status , Patient's Chart, lab work & pertinent test results  History of Anesthesia Complications Negative for: history of anesthetic complications  Airway Mallampati: II  TM Distance: >3 FB Neck ROM: Full    Dental  (+) Upper Dentures   Pulmonary neg sleep apnea, COPD,    breath sounds clear to auscultation- rhonchi (-) wheezing      Cardiovascular hypertension, Pt. on medications (-) CAD, (-) Past MI, (-) Cardiac Stents and (-) CABG  Rhythm:Regular Rate:Normal - Systolic murmurs and - Diastolic murmurs    Neuro/Psych  Headaches, neg Seizures Anxiety    GI/Hepatic Neg liver ROS, PUD, GERD  ,  Endo/Other  negative endocrine ROSneg diabetes  Renal/GU Renal InsufficiencyRenal disease     Musculoskeletal  (+) Arthritis ,   Abdominal (+) - obese,   Peds  Hematology  (+) anemia ,   Anesthesia Other Findings Past Medical History: No date: Acid reflux No date: Anxiety No date: Arthritis 09/2015: Bronchitis No date: Gross hematuria No date: H. pylori infection No date: Heart murmur No date: Kidney failure 01/06/2018: Labile essential hypertension No date: Lung abnormality     Comment:  "damaged lung due to pneumonia" No date: Neck pain No date: Nephrolithiasis No date: Pneumonia No date: Pneumothorax No date: Scoliosis No date: Urinary frequency No date: Vaginal atrophy   Reproductive/Obstetrics                             Anesthesia Physical Anesthesia Plan  ASA: III  Anesthesia Plan: General   Post-op Pain Management:    Induction: Intravenous  PONV Risk Score and Plan: 2 and Propofol infusion  Airway Management Planned: Natural Airway  Additional Equipment:   Intra-op Plan:   Post-operative Plan:   Informed Consent: I have reviewed the  patients History and Physical, chart, labs and discussed the procedure including the risks, benefits and alternatives for the proposed anesthesia with the patient or authorized representative who has indicated his/her understanding and acceptance.     Dental advisory given  Plan Discussed with: CRNA and Anesthesiologist  Anesthesia Plan Comments:         Anesthesia Quick Evaluation

## 2018-12-29 NOTE — Anesthesia Postprocedure Evaluation (Signed)
Anesthesia Post Note  Patient: Rachael Jordan  Procedure(s) Performed: COLONOSCOPY WITH PROPOFOL (N/A )  Patient location during evaluation: Endoscopy Anesthesia Type: General Level of consciousness: awake and alert and oriented Pain management: pain level controlled Vital Signs Assessment: post-procedure vital signs reviewed and stable Respiratory status: spontaneous breathing, nonlabored ventilation and respiratory function stable Cardiovascular status: blood pressure returned to baseline and stable Postop Assessment: no signs of nausea or vomiting Anesthetic complications: no     Last Vitals:  Vitals:   12/29/18 1125 12/29/18 1135  BP:    Pulse:    Resp:    Temp:    SpO2: 100% 100%    Last Pain:  Vitals:   12/29/18 1135  TempSrc:   PainSc: 0-No pain                 Shadd Dunstan

## 2018-12-30 ENCOUNTER — Encounter: Payer: Self-pay | Admitting: Gastroenterology

## 2018-12-31 ENCOUNTER — Observation Stay: Payer: PRIVATE HEALTH INSURANCE

## 2018-12-31 ENCOUNTER — Observation Stay
Admission: RE | Admit: 2018-12-31 | Discharge: 2019-01-01 | Disposition: A | Discharge status: Home / Self Care | Payer: PRIVATE HEALTH INSURANCE | Source: Ambulatory Visit | Attending: Nephrology | Admitting: Nephrology

## 2018-12-31 ENCOUNTER — Other Ambulatory Visit: Payer: Self-pay

## 2018-12-31 DIAGNOSIS — I129 Hypertensive chronic kidney disease with stage 1 through stage 4 chronic kidney disease, or unspecified chronic kidney disease: Secondary | ICD-10-CM | POA: Insufficient documentation

## 2018-12-31 DIAGNOSIS — K219 Gastro-esophageal reflux disease without esophagitis: Secondary | ICD-10-CM | POA: Diagnosis not present

## 2018-12-31 DIAGNOSIS — Z794 Long term (current) use of insulin: Secondary | ICD-10-CM | POA: Insufficient documentation

## 2018-12-31 DIAGNOSIS — N189 Chronic kidney disease, unspecified: Secondary | ICD-10-CM | POA: Insufficient documentation

## 2018-12-31 DIAGNOSIS — Z7982 Long term (current) use of aspirin: Secondary | ICD-10-CM | POA: Diagnosis not present

## 2018-12-31 DIAGNOSIS — Z79899 Other long term (current) drug therapy: Secondary | ICD-10-CM | POA: Insufficient documentation

## 2018-12-31 DIAGNOSIS — Z888 Allergy status to other drugs, medicaments and biological substances status: Secondary | ICD-10-CM | POA: Insufficient documentation

## 2018-12-31 DIAGNOSIS — R3129 Other microscopic hematuria: Principal | ICD-10-CM | POA: Insufficient documentation

## 2018-12-31 LAB — TYPE AND SCREEN
ABO/RH(D): O POS
Antibody Screen: POSITIVE
Unit division: 0
Unit division: 0

## 2018-12-31 LAB — BPAM RBC
Blood Product Expiration Date: 202011152359
Blood Product Expiration Date: 202011152359
Unit Type and Rh: 5100
Unit Type and Rh: 5100

## 2018-12-31 MED ORDER — MIDAZOLAM HCL 2 MG/2ML IJ SOLN
INTRAMUSCULAR | Status: AC
  Filled 2018-12-31: qty 4

## 2018-12-31 MED ORDER — VENLAFAXINE HCL ER 37.5 MG PO CP24
37.5000 mg | ORAL_CAPSULE | Freq: Every day | ORAL | Status: DC
  Administered 2019-01-01: 37.5 mg via ORAL
  Filled 2018-12-31: qty 1

## 2018-12-31 MED ORDER — LORATADINE 10 MG PO TABS
10.0000 mg | ORAL_TABLET | Freq: Every day | ORAL | Status: DC
  Administered 2018-12-31: 10 mg via ORAL
  Filled 2018-12-31: qty 1

## 2018-12-31 MED ORDER — ADULT MULTIVITAMIN W/MINERALS CH
1.0000 | ORAL_TABLET | Freq: Every day | ORAL | Status: DC
  Administered 2018-12-31 – 2019-01-01 (×2): 1 via ORAL
  Filled 2018-12-31 (×2): qty 1

## 2018-12-31 MED ORDER — FENTANYL CITRATE (PF) 100 MCG/2ML IJ SOLN
INTRAMUSCULAR | Status: AC
  Filled 2018-12-31: qty 2

## 2018-12-31 MED ORDER — GABAPENTIN 800 MG PO TABS
800.0000 mg | ORAL_TABLET | Freq: Three times a day (TID) | ORAL | Status: DC
  Filled 2018-12-31: qty 1

## 2018-12-31 MED ORDER — ASPIRIN EC 81 MG PO TBEC
81.0000 mg | DELAYED_RELEASE_TABLET | Freq: Every day | ORAL | Status: DC
  Administered 2019-01-01: 81 mg via ORAL
  Filled 2018-12-31: qty 1

## 2018-12-31 MED ORDER — MOMETASONE FURO-FORMOTEROL FUM 100-5 MCG/ACT IN AERO
2.0000 | INHALATION_SPRAY | Freq: Two times a day (BID) | RESPIRATORY_TRACT | Status: DC
  Administered 2018-12-31 – 2019-01-01 (×2): 2 via RESPIRATORY_TRACT
  Filled 2018-12-31: qty 8.8

## 2018-12-31 MED ORDER — MIDAZOLAM HCL 2 MG/2ML IJ SOLN
INTRAMUSCULAR | Status: AC | PRN
  Administered 2018-12-31 (×2): 1 mg via INTRAVENOUS

## 2018-12-31 MED ORDER — DULOXETINE HCL 30 MG PO CPEP
30.0000 mg | ORAL_CAPSULE | Freq: Every day | ORAL | Status: DC
  Administered 2018-12-31 – 2019-01-01 (×2): 30 mg via ORAL
  Filled 2018-12-31 (×2): qty 1

## 2018-12-31 MED ORDER — MONTELUKAST SODIUM 10 MG PO TABS
10.0000 mg | ORAL_TABLET | Freq: Every day | ORAL | Status: DC
  Administered 2018-12-31: 10 mg via ORAL
  Filled 2018-12-31: qty 1

## 2018-12-31 MED ORDER — GABAPENTIN 400 MG PO CAPS
800.0000 mg | ORAL_CAPSULE | Freq: Three times a day (TID) | ORAL | Status: DC
  Administered 2018-12-31 – 2019-01-01 (×2): 800 mg via ORAL
  Filled 2018-12-31 (×2): qty 2

## 2018-12-31 MED ORDER — TRAZODONE HCL 100 MG PO TABS
100.0000 mg | ORAL_TABLET | Freq: Every day | ORAL | Status: DC
  Administered 2018-12-31: 20:00:00 100 mg via ORAL
  Filled 2018-12-31: qty 1

## 2018-12-31 MED ORDER — IRBESARTAN 150 MG PO TABS
150.0000 mg | ORAL_TABLET | Freq: Every day | ORAL | Status: DC
  Administered 2018-12-31 – 2019-01-01 (×2): 150 mg via ORAL
  Filled 2018-12-31 (×2): qty 1

## 2018-12-31 MED ORDER — ATORVASTATIN CALCIUM 20 MG PO TABS
20.0000 mg | ORAL_TABLET | Freq: Every day | ORAL | Status: DC
  Administered 2018-12-31: 20:00:00 20 mg via ORAL
  Filled 2018-12-31: qty 1

## 2018-12-31 MED ORDER — FENTANYL CITRATE (PF) 100 MCG/2ML IJ SOLN
INTRAMUSCULAR | Status: AC | PRN
  Administered 2018-12-31 (×2): 50 ug via INTRAVENOUS

## 2018-12-31 MED ORDER — SODIUM CHLORIDE 0.9 % IV SOLN
INTRAVENOUS | Status: DC
  Administered 2018-12-31: 15:00:00 via INTRAVENOUS

## 2018-12-31 MED ORDER — OXYCODONE-ACETAMINOPHEN 5-325 MG PO TABS
1.0000 | ORAL_TABLET | ORAL | Status: DC | PRN
  Administered 2018-12-31 – 2019-01-01 (×3): 2 via ORAL
  Filled 2018-12-31 (×3): qty 2

## 2018-12-31 MED ORDER — DICLOFENAC SODIUM 1 % TD GEL
2.0000 g | Freq: Every day | TRANSDERMAL | Status: DC | PRN
  Administered 2019-01-01: 2 g via TOPICAL
  Filled 2018-12-31 (×2): qty 100
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
## 2018-12-31 NOTE — Procedures (Signed)
Interventional Radiology Procedure Note  Procedure: CT guided medical renal biopsy. Left kidney.  Complications: None Recommendations:  - Local wound care.  - Dr. Holley Raring has plans for 23 hour observation - Do not submerge for 7 days - Advance diet per the primary team.  VIR ok with diet  Signed,  Dulcy Fanny. Earleen Newport, DO

## 2018-12-31 NOTE — Sedation Documentation (Signed)
Notes transferred from wrong pt chart line by line.
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
## 2019-01-01 DIAGNOSIS — R3129 Other microscopic hematuria: Secondary | ICD-10-CM | POA: Diagnosis not present

## 2019-01-01 LAB — CBC
HCT: 31.4 % — ABNORMAL LOW (ref 36.0–46.0)
Hemoglobin: 10 g/dL — ABNORMAL LOW (ref 12.0–15.0)
MCH: 29.2 pg (ref 26.0–34.0)
MCHC: 31.8 g/dL (ref 30.0–36.0)
MCV: 91.5 fL (ref 80.0–100.0)
Platelets: 273 10*3/uL (ref 150–400)
RBC: 3.43 MIL/uL — ABNORMAL LOW (ref 3.87–5.11)
RDW: 13.2 % (ref 11.5–15.5)
WBC: 10.7 10*3/uL — ABNORMAL HIGH (ref 4.0–10.5)
nRBC: 0 % (ref 0.0–0.2)

## 2019-01-01 LAB — RENAL FUNCTION PANEL
Albumin: 3.5 g/dL (ref 3.5–5.0)
Anion gap: 9 (ref 5–15)
BUN: 16 mg/dL (ref 8–23)
CO2: 27 mmol/L (ref 22–32)
Calcium: 8.8 mg/dL — ABNORMAL LOW (ref 8.9–10.3)
Chloride: 106 mmol/L (ref 98–111)
Creatinine, Ser: 0.88 mg/dL (ref 0.44–1.00)
GFR calc Af Amer: >60 mL/min (ref >60)
GFR calc non Af Amer: >60 mL/min (ref >60)
Glucose, Bld: 142 mg/dL — ABNORMAL HIGH (ref 70–99)
Phosphorus: 3.9 mg/dL (ref 2.5–4.6)
Potassium: 3.3 mmol/L — ABNORMAL LOW (ref 3.5–5.1)
Sodium: 142 mmol/L (ref 135–145)

## 2019-01-01 MED ORDER — TIZANIDINE HCL 4 MG PO TABS
4.0000 mg | ORAL_TABLET | Freq: Four times a day (QID) | ORAL | Status: DC
  Administered 2019-01-01 (×2): 4 mg via ORAL
  Filled 2019-01-01 (×6): qty 1

## 2019-01-01 MED ORDER — PROMETHAZINE HCL 25 MG PO TABS: 25.0000 mg | ORAL_TABLET | Freq: Four times a day (QID) | ORAL | 0 refills | Status: DC | PRN

## 2019-01-01 MED ORDER — OXYCODONE-ACETAMINOPHEN 5-325 MG PO TABS: 1.0000 | ORAL_TABLET | ORAL | 0 refills | Status: DC | PRN

## 2019-01-01 NOTE — Discharge Instructions (Signed)
No heavy lifting greater than 5lbs for the next 10 days. If you have severe left flank pain or blood in the urine that doesn't clear, come back to the emergency department.

## 2019-01-01 NOTE — Progress Notes (Signed)
Rachael Jordan  A and O x 4 VSS. Pt tolerating diet well. No complaints of pain or nausea. IV removed intact, prescriptions given. Pt voices understanding of discharge instructions with no further questions. Pt discharged via wheelchair with axillary.   Allergies as of 01/01/2019      Reactions   Grapefruit Bioflavonoid Complex Rash   Grapefruit Extract Rash   Propoxyphene Nausea And Vomiting, Nausea Only   Prednisolone    Psychotic features      Medication List    STOP taking these medications   aspirin EC 81 MG tablet   celecoxib 200 MG capsule Commonly known as: CELEBREX   diclofenac sodium 1 % Gel Commonly known as: VOLTAREN   metoCLOPramide 10 MG tablet Commonly known as: REGLAN     TAKE these medications   albuterol 108 (90 Base) MCG/ACT inhaler Commonly known as: VENTOLIN HFA Inhale 1 puff into the lungs every 6 (six) hours as needed for wheezing or shortness of breath.   atorvastatin 20 MG tablet Commonly known as: LIPITOR Take 20 mg by mouth daily at 6 PM.   azelastine 0.1 % nasal spray Commonly known as: ASTELIN Place 1 spray into the nose 2 (two) times daily.   Biotin 10000 MCG Tabs Take 1 tablet by mouth daily.   budesonide-formoterol 80-4.5 MCG/ACT inhaler Commonly known as: SYMBICORT Inhale 2 puffs into the lungs 2 (two) times daily.   Calcium Carbonate-Vitamin D3 600-400 MG-UNIT Tabs Take 1 tablet by mouth daily.   CENTRUM ADULTS PO Take by mouth. Reported on 09/28/2015   cetirizine 10 MG tablet Commonly known as: ZYRTEC Take 10 mg by mouth at bedtime. Reported on 08/01/2015   docusate sodium 100 MG capsule Commonly known as: COLACE Take 100 mg by mouth daily as needed for mild constipation.   DULoxetine 30 MG capsule Commonly known as: CYMBALTA Take 30 mg by mouth daily.   gabapentin 800 MG tablet Commonly known as: NEURONTIN Take 800 mg by mouth 3 (three) times daily.   hydrocortisone 2.5 % ointment Apply topically.    ipratropium-albuterol 0.5-2.5 (3) MG/3ML Soln Commonly known as: DUONEB Take 3 mLs by nebulization every 4 (four) hours as needed.   Linzess 72 MCG capsule Generic drug: linaclotide Take by mouth.   montelukast 10 MG tablet Commonly known as: SINGULAIR Take 10 mg by mouth daily.   nabumetone 750 MG tablet Commonly known as: RELAFEN Take 750 mg by mouth 2 (two) times daily as needed.   olmesartan 20 MG tablet Commonly known as: BENICAR Take 10 mg by mouth daily.   Oxycodone HCl 10 MG Tabs Take 5-10 mg by mouth See admin instructions. Take 5-10 mg 3-5 times daily if tolerated   oxyCODONE-acetaminophen 5-325 MG tablet Commonly known as: PERCOCET/ROXICET Take 1-2 tablets by mouth every 4 (four) hours as needed for moderate pain or severe pain.   promethazine 25 MG tablet Commonly known as: PHENERGAN Take 1 tablet (25 mg total) by mouth every 6 (six) hours as needed for nausea or vomiting.   rizatriptan 10 MG tablet Commonly known as: MAXALT Take 10 mg by mouth as needed for migraine. May repeat in 2 hours if needed   tiZANidine 4 MG tablet Commonly known as: ZANAFLEX Take 4 mg by mouth 4 (four) times daily.   traZODone 100 MG tablet Commonly known as: DESYREL Take 100 mg by mouth at bedtime.   trospium 20 MG tablet Commonly known as: SANCTURA Take 20 mg by mouth at bedtime.   valACYclovir 1000  MG tablet Commonly known as: VALTREX Take 1,000 mg by mouth as needed.   venlafaxine XR 37.5 MG 24 hr capsule Commonly known as: EFFEXOR-XR Take 37.5 mg by mouth daily.   vitamin C 500 MG tablet Commonly known as: ASCORBIC ACID Take 500 mg by mouth daily.   Vitamin D-1000 Max St 25 MCG (1000 UT) tablet Generic drug: Cholecalciferol Take by mouth.       Vitals:   01/01/19 0550 01/01/19 0602  BP: (!) 84/52 98/64  Pulse: 78 79  Resp: 20   Temp: 98 F (36.7 C)   SpO2: 99%     Rachael Jordan Rachael Jordan

## 2019-01-01 NOTE — Final Progress Note (Signed)
Physician Final Progress Note  [name] MRN: [id number] DOB/AGE: [birth year] 61 y.o.  Admit date: 12/31/2018 Admitting provider: Anthonette Legato, MD Discharge date: 01/01/2019   Admission Diagnoses: microscopic hematuria  Discharge Diagnoses:  Microscopic hematuria status post CT guided renal biopsy.  Consults: Interventional Radiology  Significant Findings/ Diagnostic Studies: CT guided left kidney biopsy  Procedures: CT guided left kidney biopsy  Discharge Condition: Good  Disposition: To home.  Diet: Regular diet.   Discharge Activity: No heavy lifting great than 5 pounds for 10 days.     Follow-up Information    Anthonette Legato, MD. Call in 5 day(s).   Specialty: Nephrology Contact information: [place] [address] [phone]           Total time spent taking care of this patient: 10 minutes  Signed: Milla Wahlberg 01/01/2019, 11:58 AM

## 2019-01-06 ENCOUNTER — Encounter: Payer: Self-pay | Admitting: Nephrology

## 2019-01-06 LAB — SURGICAL PATHOLOGY

## 2019-02-10 ENCOUNTER — Encounter: Payer: Self-pay | Admitting: Internal Medicine

## 2019-02-18 ENCOUNTER — Other Ambulatory Visit: Payer: Self-pay

## 2019-02-18 ENCOUNTER — Observation Stay: Payer: PRIVATE HEALTH INSURANCE

## 2019-02-18 ENCOUNTER — Emergency Department: Payer: PRIVATE HEALTH INSURANCE

## 2019-02-18 ENCOUNTER — Encounter: Payer: Self-pay | Admitting: Emergency Medicine

## 2019-02-18 ENCOUNTER — Inpatient Hospital Stay
Admission: EM | Admit: 2019-02-18 | Discharge: 2019-02-20 | DRG: 149 | Disposition: A | Discharge status: Home / Self Care | Payer: PRIVATE HEALTH INSURANCE | Attending: Internal Medicine | Admitting: Internal Medicine

## 2019-02-18 DIAGNOSIS — R42 Dizziness and giddiness: Secondary | ICD-10-CM | POA: Diagnosis not present

## 2019-02-18 DIAGNOSIS — Z809 Family history of malignant neoplasm, unspecified: Secondary | ICD-10-CM

## 2019-02-18 DIAGNOSIS — G894 Chronic pain syndrome: Secondary | ICD-10-CM

## 2019-02-18 DIAGNOSIS — F418 Other specified anxiety disorders: Secondary | ICD-10-CM | POA: Diagnosis present

## 2019-02-18 DIAGNOSIS — F329 Major depressive disorder, single episode, unspecified: Secondary | ICD-10-CM | POA: Diagnosis present

## 2019-02-18 DIAGNOSIS — Z8261 Family history of arthritis: Secondary | ICD-10-CM

## 2019-02-18 DIAGNOSIS — J302 Other seasonal allergic rhinitis: Secondary | ICD-10-CM

## 2019-02-18 DIAGNOSIS — M25559 Pain in unspecified hip: Secondary | ICD-10-CM

## 2019-02-18 DIAGNOSIS — G253 Myoclonus: Secondary | ICD-10-CM | POA: Diagnosis present

## 2019-02-18 DIAGNOSIS — Z87442 Personal history of urinary calculi: Secondary | ICD-10-CM

## 2019-02-18 DIAGNOSIS — R519 Headache, unspecified: Secondary | ICD-10-CM

## 2019-02-18 DIAGNOSIS — B9681 Helicobacter pylori [H. pylori] as the cause of diseases classified elsewhere: Secondary | ICD-10-CM | POA: Diagnosis present

## 2019-02-18 DIAGNOSIS — Z825 Family history of asthma and other chronic lower respiratory diseases: Secondary | ICD-10-CM

## 2019-02-18 DIAGNOSIS — N179 Acute kidney failure, unspecified: Secondary | ICD-10-CM | POA: Diagnosis present

## 2019-02-18 DIAGNOSIS — Z8249 Family history of ischemic heart disease and other diseases of the circulatory system: Secondary | ICD-10-CM

## 2019-02-18 DIAGNOSIS — G43909 Migraine, unspecified, not intractable, without status migrainosus: Secondary | ICD-10-CM | POA: Diagnosis present

## 2019-02-18 DIAGNOSIS — I10 Essential (primary) hypertension: Secondary | ICD-10-CM

## 2019-02-18 DIAGNOSIS — I129 Hypertensive chronic kidney disease with stage 1 through stage 4 chronic kidney disease, or unspecified chronic kidney disease: Secondary | ICD-10-CM | POA: Diagnosis present

## 2019-02-18 DIAGNOSIS — J449 Chronic obstructive pulmonary disease, unspecified: Secondary | ICD-10-CM | POA: Diagnosis present

## 2019-02-18 DIAGNOSIS — Z90710 Acquired absence of both cervix and uterus: Secondary | ICD-10-CM

## 2019-02-18 DIAGNOSIS — Z79899 Other long term (current) drug therapy: Secondary | ICD-10-CM

## 2019-02-18 DIAGNOSIS — N183 Chronic kidney disease, stage 3 unspecified: Secondary | ICD-10-CM | POA: Diagnosis present

## 2019-02-18 DIAGNOSIS — R296 Repeated falls: Secondary | ICD-10-CM

## 2019-02-18 DIAGNOSIS — Z8349 Family history of other endocrine, nutritional and metabolic diseases: Secondary | ICD-10-CM

## 2019-02-18 DIAGNOSIS — Z20828 Contact with and (suspected) exposure to other viral communicable diseases: Secondary | ICD-10-CM | POA: Diagnosis present

## 2019-02-18 DIAGNOSIS — F419 Anxiety disorder, unspecified: Secondary | ICD-10-CM | POA: Diagnosis present

## 2019-02-18 DIAGNOSIS — G4700 Insomnia, unspecified: Secondary | ICD-10-CM | POA: Diagnosis present

## 2019-02-18 DIAGNOSIS — M545 Low back pain: Secondary | ICD-10-CM | POA: Diagnosis present

## 2019-02-18 DIAGNOSIS — Z79891 Long term (current) use of opiate analgesic: Secondary | ICD-10-CM

## 2019-02-18 DIAGNOSIS — M419 Scoliosis, unspecified: Secondary | ICD-10-CM | POA: Diagnosis present

## 2019-02-18 DIAGNOSIS — M542 Cervicalgia: Secondary | ICD-10-CM | POA: Diagnosis present

## 2019-02-18 LAB — CBC WITH DIFFERENTIAL/PLATELET
Abs Immature Granulocytes: 0.02 10*3/uL (ref 0.00–0.07)
Basophils Absolute: 0.1 10*3/uL (ref 0.0–0.1)
Basophils Relative: 1 %
Eosinophils Absolute: 0.2 10*3/uL (ref 0.0–0.5)
Eosinophils Relative: 2 %
HCT: 31.6 % — ABNORMAL LOW (ref 36.0–46.0)
Hemoglobin: 10 g/dL — ABNORMAL LOW (ref 12.0–15.0)
Immature Granulocytes: 0 %
Lymphocytes Relative: 22 %
Lymphs Abs: 2 10*3/uL (ref 0.7–4.0)
MCH: 29.2 pg (ref 26.0–34.0)
MCHC: 31.6 g/dL (ref 30.0–36.0)
MCV: 92.1 fL (ref 80.0–100.0)
Monocytes Absolute: 0.7 10*3/uL (ref 0.1–1.0)
Monocytes Relative: 8 %
Neutro Abs: 6.2 10*3/uL (ref 1.7–7.7)
Neutrophils Relative %: 67 %
Platelets: 351 10*3/uL (ref 150–400)
RBC: 3.43 MIL/uL — ABNORMAL LOW (ref 3.87–5.11)
RDW: 13.4 % (ref 11.5–15.5)
WBC: 9.1 10*3/uL (ref 4.0–10.5)
nRBC: 0 % (ref 0.0–0.2)

## 2019-02-18 LAB — URINALYSIS, COMPLETE (UACMP) WITH MICROSCOPIC
Bacteria, UA: NONE SEEN
Bilirubin Urine: NEGATIVE
Glucose, UA: NEGATIVE mg/dL
Hgb urine dipstick: NEGATIVE
Ketones, ur: NEGATIVE mg/dL
Leukocytes,Ua: NEGATIVE
Nitrite: NEGATIVE
Protein, ur: NEGATIVE mg/dL
Specific Gravity, Urine: 1.004 — ABNORMAL LOW (ref 1.005–1.030)
pH: 6 (ref 5.0–8.0)

## 2019-02-18 LAB — COMPREHENSIVE METABOLIC PANEL
ALT: 11 U/L (ref 0–44)
AST: 17 U/L (ref 15–41)
Albumin: 3.9 g/dL (ref 3.5–5.0)
Alkaline Phosphatase: 61 U/L (ref 38–126)
Anion gap: 8 (ref 5–15)
BUN: 13 mg/dL (ref 8–23)
CO2: 28 mmol/L (ref 22–32)
Calcium: 9.2 mg/dL (ref 8.9–10.3)
Chloride: 103 mmol/L (ref 98–111)
Creatinine, Ser: 1.54 mg/dL — ABNORMAL HIGH (ref 0.44–1.00)
GFR calc Af Amer: 42 mL/min — ABNORMAL LOW (ref >60)
GFR calc non Af Amer: 36 mL/min — ABNORMAL LOW (ref >60)
Glucose, Bld: 100 mg/dL — ABNORMAL HIGH (ref 70–99)
Potassium: 4 mmol/L (ref 3.5–5.1)
Sodium: 139 mmol/L (ref 135–145)
Total Bilirubin: 0.7 mg/dL (ref 0.3–1.2)
Total Protein: 7.6 g/dL (ref 6.5–8.1)

## 2019-02-18 LAB — GLUCOSE, CAPILLARY: Glucose-Capillary: 99 mg/dL (ref 70–99)

## 2019-02-18 LAB — TROPONIN I (HIGH SENSITIVITY)
Troponin I (High Sensitivity): <2 ng/L (ref <18)
Troponin I (High Sensitivity): <2 ng/L (ref <18)

## 2019-02-18 LAB — HIV ANTIBODY (ROUTINE TESTING W REFLEX): HIV Screen 4th Generation wRfx: NONREACTIVE

## 2019-02-18 MED ORDER — DULOXETINE HCL 30 MG PO CPEP
30.0000 mg | ORAL_CAPSULE | Freq: Every day | ORAL | Status: DC
  Administered 2019-02-19 – 2019-02-20 (×2): 30 mg via ORAL
  Filled 2019-02-18 (×2): qty 1

## 2019-02-18 MED ORDER — GABAPENTIN 400 MG PO CAPS
800.0000 mg | ORAL_CAPSULE | Freq: Three times a day (TID) | ORAL | Status: DC
  Administered 2019-02-18: 800 mg via ORAL
  Filled 2019-02-18 (×2): qty 2

## 2019-02-18 MED ORDER — POLYETHYLENE GLYCOL 3350 17 G PO PACK
17.0000 g | PACK | Freq: Every day | ORAL | Status: DC | PRN
  Filled 2019-02-18: qty 1

## 2019-02-18 MED ORDER — MOMETASONE FURO-FORMOTEROL FUM 100-5 MCG/ACT IN AERO
2.0000 | INHALATION_SPRAY | Freq: Two times a day (BID) | RESPIRATORY_TRACT | Status: DC
  Administered 2019-02-19: 2 via RESPIRATORY_TRACT
  Filled 2019-02-18: qty 8.8

## 2019-02-18 MED ORDER — ONDANSETRON HCL 4 MG PO TABS
4.0000 mg | ORAL_TABLET | Freq: Four times a day (QID) | ORAL | Status: DC | PRN
  Filled 2019-02-18: qty 1

## 2019-02-18 MED ORDER — LACTATED RINGERS IV BOLUS
1000.0000 mL | Freq: Once | INTRAVENOUS | Status: AC
  Administered 2019-02-18: 1000 mL via INTRAVENOUS

## 2019-02-18 MED ORDER — ALBUTEROL SULFATE (2.5 MG/3ML) 0.083% IN NEBU: 2.5000 mg | INHALATION_SOLUTION | Freq: Four times a day (QID) | RESPIRATORY_TRACT | Status: DC | PRN

## 2019-02-18 MED ORDER — ATORVASTATIN CALCIUM 20 MG PO TABS
20.0000 mg | ORAL_TABLET | Freq: Every day | ORAL | Status: DC
  Administered 2019-02-18 – 2019-02-19 (×2): 20 mg via ORAL
  Filled 2019-02-18 (×4): qty 1

## 2019-02-18 MED ORDER — TRAZODONE HCL 50 MG PO TABS
100.0000 mg | ORAL_TABLET | Freq: Every day | ORAL | Status: DC
  Administered 2019-02-18 – 2019-02-19 (×2): 100 mg via ORAL
  Filled 2019-02-18: qty 1
  Filled 2019-02-18: qty 2

## 2019-02-18 MED ORDER — MAGNESIUM CITRATE PO SOLN
1.0000 | Freq: Once | ORAL | Status: DC | PRN
  Filled 2019-02-18: qty 296

## 2019-02-18 MED ORDER — FLUTICASONE PROPIONATE 50 MCG/ACT NA SUSP
2.0000 | Freq: Every day | NASAL | Status: DC
  Administered 2019-02-19: 2 via NASAL
  Filled 2019-02-18 (×2): qty 16

## 2019-02-18 MED ORDER — DARIFENACIN HYDROBROMIDE ER 7.5 MG PO TB24
7.5000 mg | ORAL_TABLET | Freq: Every day | ORAL | Status: DC
  Administered 2019-02-19 – 2019-02-20 (×2): 7.5 mg via ORAL
  Filled 2019-02-18 (×2): qty 1

## 2019-02-18 MED ORDER — IRBESARTAN 150 MG PO TABS
150.0000 mg | ORAL_TABLET | Freq: Every day | ORAL | Status: DC
  Administered 2019-02-19 – 2019-02-20 (×2): 150 mg via ORAL
  Filled 2019-02-18 (×2): qty 1

## 2019-02-18 MED ORDER — TIZANIDINE HCL 4 MG PO TABS
4.0000 mg | ORAL_TABLET | Freq: Four times a day (QID) | ORAL | Status: DC | PRN
  Filled 2019-02-18: qty 1

## 2019-02-18 MED ORDER — LORATADINE 10 MG PO TABS
10.0000 mg | ORAL_TABLET | Freq: Every day | ORAL | Status: DC
  Administered 2019-02-19 – 2019-02-20 (×2): 10 mg via ORAL
  Filled 2019-02-18 (×2): qty 1

## 2019-02-18 MED ORDER — ACETAMINOPHEN 650 MG RE SUPP: 650.0000 mg | Freq: Four times a day (QID) | RECTAL | Status: DC | PRN

## 2019-02-18 MED ORDER — DIPHENHYDRAMINE HCL 50 MG/ML IJ SOLN
25.0000 mg | Freq: Once | INTRAMUSCULAR | Status: AC
  Administered 2019-02-18: 25 mg via INTRAVENOUS
  Filled 2019-02-18: qty 1

## 2019-02-18 MED ORDER — MONTELUKAST SODIUM 10 MG PO TABS
10.0000 mg | ORAL_TABLET | Freq: Every day | ORAL | Status: DC
  Administered 2019-02-19: 21:00:00 10 mg via ORAL
  Filled 2019-02-18 (×3): qty 1

## 2019-02-18 MED ORDER — ACETAMINOPHEN 325 MG PO TABS: 650.0000 mg | ORAL_TABLET | Freq: Four times a day (QID) | ORAL | Status: DC | PRN

## 2019-02-18 MED ORDER — TRAZODONE HCL 50 MG PO TABS
25.0000 mg | ORAL_TABLET | Freq: Every evening | ORAL | Status: DC | PRN
  Administered 2019-02-20: 01:00:00 25 mg via ORAL
  Filled 2019-02-18: qty 1
  Filled 2019-02-18: qty 0.5

## 2019-02-18 MED ORDER — KETOROLAC TROMETHAMINE 30 MG/ML IJ SOLN: 15.0000 mg | Freq: Four times a day (QID) | INTRAMUSCULAR | Status: DC | PRN

## 2019-02-18 MED ORDER — BISACODYL 5 MG PO TBEC
5.0000 mg | DELAYED_RELEASE_TABLET | Freq: Every day | ORAL | Status: DC | PRN
  Filled 2019-02-18: qty 1

## 2019-02-18 MED ORDER — VENLAFAXINE HCL ER 37.5 MG PO CP24
37.5000 mg | ORAL_CAPSULE | Freq: Every day | ORAL | Status: DC
  Administered 2019-02-19 – 2019-02-20 (×2): 37.5 mg via ORAL
  Filled 2019-02-18 (×2): qty 1

## 2019-02-18 MED ORDER — IPRATROPIUM-ALBUTEROL 0.5-2.5 (3) MG/3ML IN SOLN: 3.0000 mL | Freq: Four times a day (QID) | RESPIRATORY_TRACT | Status: DC | PRN

## 2019-02-18 MED ORDER — OXYCODONE HCL 5 MG PO TABS
5.0000 mg | ORAL_TABLET | Freq: Four times a day (QID) | ORAL | Status: DC | PRN
  Administered 2019-02-19 (×2): 5 mg via ORAL
  Filled 2019-02-18 (×2): qty 1

## 2019-02-18 MED ORDER — HEPARIN SODIUM (PORCINE) 5000 UNIT/ML IJ SOLN
5000.0000 [IU] | Freq: Three times a day (TID) | INTRAMUSCULAR | Status: DC
  Administered 2019-02-18 – 2019-02-20 (×5): 5000 [IU] via SUBCUTANEOUS
  Filled 2019-02-18 (×9): qty 1

## 2019-02-18 MED ORDER — ONDANSETRON HCL 4 MG/2ML IJ SOLN: 4.0000 mg | Freq: Four times a day (QID) | INTRAMUSCULAR | Status: DC | PRN

## 2019-02-18 MED ORDER — AZELASTINE HCL 0.1 % NA SOLN
1.0000 | Freq: Two times a day (BID) | NASAL | Status: DC
  Administered 2019-02-19: 1 via NASAL
  Filled 2019-02-18 (×2): qty 30

## 2019-02-18 MED ORDER — ASPIRIN EC 81 MG PO TBEC
81.0000 mg | DELAYED_RELEASE_TABLET | Freq: Every day | ORAL | Status: DC
  Administered 2019-02-18 – 2019-02-20 (×3): 81 mg via ORAL
  Filled 2019-02-18 (×4): qty 1

## 2019-02-18 MED ORDER — PROMETHAZINE HCL 25 MG PO TABS
25.0000 mg | ORAL_TABLET | Freq: Four times a day (QID) | ORAL | Status: DC | PRN
  Filled 2019-02-18: qty 1

## 2019-02-18 MED ORDER — TRAMADOL HCL 50 MG PO TABS: 50.0000 mg | ORAL_TABLET | Freq: Four times a day (QID) | ORAL | Status: DC | PRN

## 2019-02-18 MED ORDER — PROCHLORPERAZINE EDISYLATE 10 MG/2ML IJ SOLN
10.0000 mg | Freq: Once | INTRAMUSCULAR | Status: AC
  Administered 2019-02-18: 10 mg via INTRAVENOUS
  Filled 2019-02-18: qty 2

## 2019-02-18 MED ORDER — BUTALBITAL-APAP-CAFFEINE 50-325-40 MG PO TABS
1.0000 | ORAL_TABLET | Freq: Four times a day (QID) | ORAL | Status: DC | PRN
  Administered 2019-02-19 – 2019-02-20 (×2): 1 via ORAL
  Filled 2019-02-18 (×2): qty 1

## 2019-02-18 NOTE — ED Provider Notes (Signed)
[facility] Emergency Department Provider Note   ____________________________________________   First MD Initiated Contact with Patient 02/18/19 1359     (approximate)  I have reviewed the triage vital signs and the nursing notes.   HISTORY  Chief Complaint Dizziness    HPI Rachael Jordan is a 63 y.o. female with past medical history of hypertension, CKD, and chronic pain who presents to the ED complaining of dizziness.  Patient reports she has been dealing with a constant throbbing headache for the past 2 days that affects much of her head diffusely.  She reports a remote history of migraines, but "this is different".  Pain has seemed to gradually worsen over the past couple of days and she states she has been feeling increasingly dizzy.  She will feel like the room is spinning around her at times but also will feel lightheaded.  She has had significantly difficulty with her balance over the past 24 hours, states she has fallen multiple times this morning.  She reports falling all the way to the ground and hitting her head, but denies losing consciousness.  She denies any focal numbness or weakness, but states "my arms and legs will not do what I want them to do".  She denies any fevers, chills, cough, chest pain, or shortness of breath.        Past Medical History:  Diagnosis Date  . Acid reflux   . Anxiety   . Arthritis   . Bronchitis 09/2015  . Gross hematuria   . H. pylori infection   . Heart murmur   . Kidney failure   . 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 Lung abnormality    "damaged lung due to pneumonia"  . Neck pain   . Nephrolithiasis   . Pneumonia   . Pneumothorax   . Scoliosis   . Urinary frequency   . Vaginal atrophy     Patient Active Problem List   Diagnosis Date Noted  . Encounter for screening colonoscopy   . Bunion 05/06/2018  . Metatarsalgia of right foot 05/06/2018  . Age-related osteoporosis without current  pathological fracture 03/31/2018  . False positive ana 03/31/2018  . Mouth dryness 02/18/2018  . Screening for osteoporosis 02/18/2018  . 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 Lacunar infarction ([place]) 01/06/2018  . Compression fracture of thoracic vertebra, sequela 07/12/2017  . Lumbar compression fracture, sequela 07/12/2017  . Nocturnal hypoxia 07/12/2017  . Simple chronic bronchitis ([place]) 07/12/2017  . Intractable back pain 06/05/2017  . Overweight (BMI 25.0-29.9) 04/08/2017  . Chronic pain syndrome 04/06/2017  . Sensory disturbance 01/08/2017  . Pain in the groin, right 10/28/2016  . Paresthesia of both hands 12/14/2015  . B12 deficiency 10/31/2015  . SOB (shortness of breath) 08/29/2015  . History of hematuria 07/12/2015  . Urinary frequency 07/12/2015  . Vaginal atrophy 07/12/2015  . HAV (hallux abducto valgus) 07/02/2015  . Overlapping toe 07/02/2015  . Hammertoe 07/02/2015  . LBP (low back pain) 01/17/2015  . Trochanteric bursitis of right hip 01/17/2015  . PNA (pneumonia) 12/28/2014  . DDD (degenerative disc disease), lumbar 08/15/2014  . Lumbar radicular syndrome 08/15/2014  . 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 DDD (degenerative disc disease), cervical 08/15/2014  . Bilateral occipital neuralgia 08/15/2014  . Migraine 08/15/2014  . Sacroiliac joint dysfunction 08/15/2014  . Back pain, chronic 09/23/2013  . Chronic cervical pain 09/23/2013  . Arthralgia of temporomandibular joint 08/29/2013  .  Absolute anemia 08/15/2013  . Chronic kidney disease (CKD), stage III (moderate) 08/15/2013  . Abnormal serum level of alkaline phosphatase 08/15/2013  . Blush 08/15/2013  . Hematuria, microscopic 08/15/2013  . Inflamed nasal mucosa 08/15/2013  . Big thyroid 08/15/2013  . Disease of thyroid gland 08/15/2013  . Positive H. pylori test 08/15/2013  . Gastrointestinal ulcer due  to Helicobacter pylori 01/74/9449  . Other specified bacterial intestinal infections 08/14/2013  . Kidney failure 08/09/2013  . SIRS (systemic inflammatory response syndrome) ([place]) 08/09/2013  . Systemic inflammatory response syndrome (SIRS) ([place]) 08/09/2013    Past Surgical History:  Procedure Laterality Date  . ABDOMINAL HYSTERECTOMY     vaginal  . APPENDECTOMY    . COLONOSCOPY WITH PROPOFOL N/A 12/29/2018   Procedure: COLONOSCOPY WITH PROPOFOL;  Surgeon: Lucilla Lame, MD;  Location: [facility] ENDOSCOPY;  Service: Endoscopy;  Laterality: N/A;  . FOOT SURGERY Left    10/2015  . NECK SURGERY      Prior to Admission medications   Medication Sig Start Date End Date Taking? Authorizing Provider  albuterol (VENTOLIN HFA) 108 (90 Base) MCG/ACT inhaler Inhale 1 puff into the lungs every 6 (six) hours as needed for wheezing or shortness of breath.    [provider]  atorvastatin (LIPITOR) 20 MG tablet Take 20 mg by mouth daily at 6 PM.  01/01/18 01/01/19  [provider]  azelastine (ASTELIN) 0.1 % nasal spray Place 1 spray into the nose 2 (two) times daily.  07/05/15 12/31/18  [provider]  Biotin 10000 MCG TABS Take 1 tablet by mouth daily.    [provider]  budesonide-formoterol (SYMBICORT) 80-4.5 MCG/ACT inhaler Inhale 2 puffs into the lungs 2 (two) times daily. 11/09/18   Allyne Gee, MD  Calcium Carbonate-Vitamin D3 600-400 MG-UNIT TABS Take 1 tablet by mouth daily.     [provider]  cetirizine (ZYRTEC) 10 MG tablet Take 10 mg by mouth at bedtime. Reported on 08/01/2015    [provider]  Cholecalciferol (VITAMIN D-1000 MAX ST) 25 MCG (1000 UT) tablet Take by mouth.    [provider]  docusate sodium (COLACE) 100 MG capsule Take 100 mg by mouth daily as needed for mild constipation.     [provider]  DULoxetine (CYMBALTA) 30 MG capsule Take 30 mg by mouth daily.  09/01/18   [provider]   gabapentin (NEURONTIN) 800 MG tablet Take 800 mg by mouth 3 (three) times daily. 02/20/17 02/04/19  [provider]  hydrocortisone 2.5 % ointment Apply topically. 09/29/18 09/29/19  [provider]  ipratropium-albuterol (DUONEB) 0.5-2.5 (3) MG/3ML SOLN Take 3 mLs by nebulization every 4 (four) hours as needed. 01/13/15   Victorino Dike, FNP  linaclotide Rolan Lipa) 72 MCG capsule Take by mouth. 06/13/17 06/14/18  [provider]  montelukast (SINGULAIR) 10 MG tablet Take 10 mg by mouth daily.    [provider]  Multiple Vitamins-Minerals (CENTRUM ADULTS PO) Take by mouth. Reported on 09/28/2015    [provider]  nabumetone (RELAFEN) 750 MG tablet Take 750 mg by mouth 2 (two) times daily as needed.    [provider]  olmesartan (BENICAR) 20 MG tablet Take 10 mg by mouth daily. 09/25/18 09/25/19  [provider]  Oxycodone HCl 10 MG TABS Take 5-10 mg by mouth See admin instructions. Take 5-10 mg 3-5 times daily if tolerated    [provider]  oxyCODONE-acetaminophen (PERCOCET/ROXICET) 5-325 MG tablet Take 1-2 tablets by mouth every 4 (  four) hours as needed for moderate pain or severe pain. 01/01/19   Holley Raring Munsoor, MD  promethazine (PHENERGAN) 25 MG tablet Take 1 tablet (25 mg total) by mouth every 6 (six) hours as needed for nausea or vomiting. 01/01/19   Holley Raring Munsoor, MD  rizatriptan (MAXALT) 10 MG tablet Take 10 mg by mouth as needed for migraine. May repeat in 2 hours if needed    [provider]  tiZANidine (ZANAFLEX) 4 MG tablet Take 4 mg by mouth 4 (four) times daily.     [provider]  traZODone (DESYREL) 100 MG tablet Take 100 mg by mouth at bedtime.    [provider]  trospium (SANCTURA) 20 MG tablet Take 20 mg by mouth at bedtime.  08/28/18 08/28/19  [provider]  valACYclovir (VALTREX) 1000 MG tablet Take 1,000 mg by mouth as needed.  06/30/18 04/12/19  [provider]   venlafaxine XR (EFFEXOR-XR) 37.5 MG 24 hr capsule Take 37.5 mg by mouth daily.  06/19/18 06/19/19  [provider]  vitamin C (ASCORBIC ACID) 500 MG tablet Take 500 mg by mouth daily.    [provider]    Allergies Grapefruit bioflavonoid complex, Grapefruit extract, Propoxyphene, and Prednisolone  Family History  Problem Relation Age of Onset  . Arthritis Mother   . Asthma Mother   . Hyperlipidemia Mother   . Hypertension Mother   . Diabetes Sister   . Cancer Maternal Aunt        esophagus  . Breast cancer Neg Hx   . Kidney cancer Neg Hx   . Bladder Cancer Neg Hx     Social History Social History   Tobacco Use  . Smoking status: Never Smoker  . Smokeless tobacco: Never Used  Substance Use Topics  . Alcohol use: No    Alcohol/week: 0.0 standard drinks  . Drug use: No    Review of Systems  Constitutional: No fever/chills Eyes: No visual changes. ENT: No sore throat. Cardiovascular: Denies chest pain. Respiratory: Denies shortness of breath. Gastrointestinal: No abdominal pain.  No nausea, no vomiting.  No diarrhea.  No constipation. Genitourinary: Negative for dysuria. Musculoskeletal: Negative for back pain. Skin: Negative for rash. Neurological: Positive for headaches, negative for focal weakness or numbness.  Positive for dizziness and multiple falls.  ____________________________________________   PHYSICAL EXAM:  VITAL SIGNS: ED Triage Vitals [02/18/19 1358]  Enc Vitals Group     BP 116/75     Pulse Rate 81     Resp 13     Temp 98 F (36.7 C)     Temp Source Oral     SpO2 98 %     Weight      Height      Head Circumference      Peak Flow      Pain Score      Pain Loc      Pain Edu?      Excl. in [place]?     Constitutional: Alert and oriented. Eyes: Conjunctivae are normal. Head: Atraumatic. Nose: No congestion/rhinnorhea. Mouth/Throat: Mucous membranes are moist. Neck: Normal ROM, no meningismus. Cardiovascular: Normal rate,  regular rhythm. Grossly normal heart sounds. Respiratory: Normal respiratory effort.  No retractions. Lungs CTAB. Gastrointestinal: Soft and nontender. No distention. Genitourinary: deferred Musculoskeletal: No lower extremity tenderness nor edema. Neurologic:  Normal speech and language. No gross focal neurologic deficits are appreciated.  Spastic movements in all 4 extremities. Skin:  Skin is warm, dry and intact. No rash noted.  Psychiatric: Mood and affect are normal. Speech and behavior are normal.  ____________________________________________   LABS (all labs ordered are listed, but only abnormal results are displayed)  Labs Reviewed  COMPREHENSIVE METABOLIC PANEL - Abnormal; Notable for the following components:      Result Value   Glucose, Bld 100 (*)    Creatinine, Ser 1.54 (*)    GFR calc non Af Amer 36 (*)    GFR calc Af Amer 42 (*)    All other components within normal limits  CBC WITH DIFFERENTIAL/PLATELET - Abnormal; Notable for the following components:   RBC 3.43 (*)    Hemoglobin 10.0 (*)    HCT 31.6 (*)    All other components within normal limits  SARS CORONAVIRUS 2 (TAT 6-24 HRS)  GLUCOSE, CAPILLARY  URINALYSIS, COMPLETE (UACMP) WITH MICROSCOPIC  TROPONIN I (HIGH SENSITIVITY)  TROPONIN I (HIGH SENSITIVITY)   ____________________________________________  EKG  ED ECG REPORT I, Blake Divine, the attending physician, personally viewed and interpreted this ECG.   Date: 02/18/2019  EKG Time: 13:56  Rate: 86  Rhythm: normal sinus rhythm  Axis: Normal  Intervals:Borderline prolonged QT  ST&T Change: None   PROCEDURES  Procedure(s) performed (including Critical Care):  Procedures   ____________________________________________   INITIAL IMPRESSION / ASSESSMENT AND PLAN / ED COURSE       63 year old female presents to the ED with 2 days of gradually worsening headache now with difficulty with dizziness and multiple falls this morning.  She does  not appear to have any focal neurologic deficits on exam but does show spastic movements at times and appears to have poor coordination.  Onset of symptoms is outside of the window for TPA and code stroke not indicated at this time.  Do not suspect large vessel occlusion.  Will check head CT for potential etiology of her symptoms, but low suspicion for [facility] given gradual symptoms.  It is possible symptoms related to complex migraine, will treat with migraine cocktail and reevaluate.  She would likely benefit from further evaluation for posterior circulation stroke.  EKG shows no evidence of arrhythmia or ischemia, labs pending.  CT head negative for acute process and labs are unremarkable.  Patient reports headache is improved following migraine cocktail, but continues to feel dizzy with difficulty in coordination of her extremities.  Will admit patient for further work-up of possible posterior stroke symptoms.      ____________________________________________   FINAL CLINICAL IMPRESSION(S) / ED DIAGNOSES  Final diagnoses:  Dizziness  Multiple falls  Acute nonintractable headache, unspecified headache type     ED Discharge Orders    None       Note:  This document was prepared using Dragon voice recognition software and may include unintentional dictation errors.   Blake Divine, MD 02/18/19 1536

## 2019-02-18 NOTE — Progress Notes (Signed)
   02/18/19 1900  Clinical Encounter Type  Visited With Patient and family together  Visit Type Initial;Spiritual support;Social support  Referral From Other (Comment)  Spiritual Encounters  Spiritual Needs Emotional;Grief support;Other (Comment)  Stress Factors  Patient Stress Factors Health changes;Loss  Family Stress Factors Not reviewed

## 2019-02-18 NOTE — H&P (Signed)
History and Physical    Rachael Jordan MCN:[id number] DOB: [birth year] DOA: 02/18/2019  PCP: Sharyne Peach, MD  Patient coming from: Home  I have personally briefly reviewed patient's old medical records in [place]  Chief Complaint: Dizziness, Falls  HPI: Rachael Jordan is a 63 y.o. female with medical history significant of HTN, chronic pain, CKD stage III.  She presented to the ED by EMS from her PCP's office due to persistent dizziness and headache for past several days.  Reports history of migraines, but says this headache quite different.  She denies history of vertigo, but her mom has it.  Patient reports room spinning at times, but mostly lightheadedness.  Denies vision changes, trouble speaking, trouble swallowing, focal weakness, numbness/tingling.  Does report all extremities feeling weak and having spasms / jerking intermittently, and weak grip causing her to drop objects.  Says her extremities "just feel funny", but cannot describe further.  She reports multiple falls related to dizziness over past 24 hours as well, which prompted her to call her PCP.  Denies loss of consciousness, chest pain, palpitations, SOB, diaphoresis.  Per patient, her BP at PCP's office was low, systolic 62'H.  Reports a history of labile blood pressures, and similar symptoms when her BP gets low, but not nearly this severe or persistent.  ED Course: vitals stable, mildly hypertensive.  Labs notable for Cr 1.54 (baseline 0.93 in August 2020), Hbg 10.0, troponin negative.  CT head and C-spine negative.  She was treated with IV fluids and migraine cocktail which helped her headache.    Admitted for observation and further work up of dizziness, balance difficulties and headache.  Review of Systems: As per HPI otherwise 10 point review of systems negative.    Past Medical History:  Diagnosis Date  . Acid reflux   . Anxiety   . Arthritis   . Bronchitis 09/2015  . Gross hematuria   . H. pylori  infection   . Heart murmur   . Kidney failure   . 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 Lung abnormality    "damaged lung due to pneumonia"  . Neck pain   . Nephrolithiasis   . Pneumonia   . Pneumothorax   . Scoliosis   . Urinary frequency   . Vaginal atrophy     Past Surgical History:  Procedure Laterality Date  . ABDOMINAL HYSTERECTOMY     vaginal  . APPENDECTOMY    . COLONOSCOPY WITH PROPOFOL N/A 12/29/2018   Procedure: COLONOSCOPY WITH PROPOFOL;  Surgeon: Lucilla Lame, MD;  Location: [facility] ENDOSCOPY;  Service: Endoscopy;  Laterality: N/A;  . FOOT SURGERY Left    10/2015  . NECK SURGERY       reports that she has never smoked. She has never used smokeless tobacco. She reports that she does not drink alcohol or use drugs.  Allergies  Allergen Reactions  . Grapefruit Bioflavonoid Complex Rash  . Grapefruit Extract Rash  . Propoxyphene Nausea And Vomiting and Nausea Only  . Prednisolone     Psychotic features    Family History  Problem Relation Age of Onset  . Arthritis Mother   . Asthma Mother   . Hyperlipidemia Mother   . Hypertension Mother   . Diabetes Sister   . Cancer Maternal Aunt        esophagus  . Breast cancer Neg Hx   . Kidney cancer Neg Hx   . Bladder Cancer Neg Hx     Prior to Admission medications  Medication Sig Start Date End Date Taking? Authorizing Provider  albuterol (VENTOLIN HFA) 108 (90 Base) MCG/ACT inhaler Inhale 1 puff into the lungs every 6 (six) hours as needed for wheezing or shortness of breath.    [provider]  atorvastatin (LIPITOR) 20 MG tablet Take 20 mg by mouth daily at 6 PM.  01/01/18 01/01/19  [provider]  azelastine (ASTELIN) 0.1 % nasal spray Place 1 spray into the nose 2 (two) times daily.  07/05/15 12/31/18  [provider]  Biotin 10000 MCG TABS Take 1 tablet by mouth daily.    [provider]  budesonide-formoterol (SYMBICORT) 80-4.5 MCG/ACT inhaler Inhale 2 puffs into  the lungs 2 (two) times daily. 11/09/18   Allyne Gee, MD  Calcium Carbonate-Vitamin D3 600-400 MG-UNIT TABS Take 1 tablet by mouth daily.     [provider]  cetirizine (ZYRTEC) 10 MG tablet Take 10 mg by mouth at bedtime. Reported on 08/01/2015    [provider]  Cholecalciferol (VITAMIN D-1000 MAX ST) 25 MCG (1000 UT) tablet Take by mouth.    [provider]  docusate sodium (COLACE) 100 MG capsule Take 100 mg by mouth daily as needed for mild constipation.     [provider]  DULoxetine (CYMBALTA) 30 MG capsule Take 30 mg by mouth daily.  09/01/18   [provider]  gabapentin (NEURONTIN) 800 MG tablet Take 800 mg by mouth 3 (three) times daily. 02/20/17 02/04/19  [provider]  hydrocortisone 2.5 % ointment Apply topically. 09/29/18 09/29/19  [provider]  ipratropium-albuterol (DUONEB) 0.5-2.5 (3) MG/3ML SOLN Take 3 mLs by nebulization every 4 (four) hours as needed. 01/13/15   Victorino Dike, FNP  linaclotide Rolan Lipa) 72 MCG capsule Take by mouth. 06/13/17 06/14/18  [provider]  montelukast (SINGULAIR) 10 MG tablet Take 10 mg by mouth daily.    [provider]  Multiple Vitamins-Minerals (CENTRUM ADULTS PO) Take by mouth. Reported on 09/28/2015    [provider]  nabumetone (RELAFEN) 750 MG tablet Take 750 mg by mouth 2 (two) times daily as needed.    [provider]  olmesartan (BENICAR) 20 MG tablet Take 10 mg by mouth daily. 09/25/18 09/25/19  [provider]  Oxycodone HCl 10 MG TABS Take 5-10 mg by mouth See admin instructions. Take 5-10 mg 3-5 times daily if tolerated    [provider]  oxyCODONE-acetaminophen (PERCOCET/ROXICET) 5-325 MG tablet Take 1-2 tablets by mouth every 4 (four) hours as needed for moderate pain or severe pain. 01/01/19   Holley Raring Munsoor, MD  promethazine (PHENERGAN) 25 MG tablet Take 1 tablet (25 mg total) by mouth every 6 (six) hours as  needed for nausea or vomiting. 01/01/19   Holley Raring Munsoor, MD  rizatriptan (MAXALT) 10 MG tablet Take 10 mg by mouth as needed for migraine. May repeat in 2 hours if needed    [provider]  tiZANidine (ZANAFLEX) 4 MG tablet Take 4 mg by mouth 4 (four) times daily.     [provider]  traZODone (DESYREL) 100 MG tablet Take 100 mg by mouth at bedtime.    [provider]  trospium (SANCTURA) 20 MG tablet Take 20 mg by mouth at bedtime.  08/28/18 08/28/19  [provider]  valACYclovir (VALTREX) 1000 MG tablet Take 1,000 mg by mouth as needed.  06/30/18 04/12/19  [provider]  venlafaxine XR (EFFEXOR-XR) 37.5 MG 24 hr capsule Take 37.5 mg by mouth daily.  06/19/18  06/19/19  [provider]  vitamin C (ASCORBIC ACID) 500 MG tablet Take 500 mg by mouth daily.    [provider]    Physical Exam: Vitals:   02/18/19 1358 02/18/19 1401 02/18/19 1500 02/18/19 1600  BP: 116/75  (!) 137/93 (!) 146/81  Pulse: 81  68 78  Resp: 13  11 10   Temp: 98 F (36.7 C)     TempSrc: Oral     SpO2: 98%  95% 95%  Weight:  71.7 kg    Height:  5' 5"  (1.651 m)       Vitals:   02/18/19 1358 02/18/19 1401 02/18/19 1500 02/18/19 1600  BP: 116/75  (!) 137/93 (!) 146/81  Pulse: 81  68 78  Resp: 13  11 10   Temp: 98 F (36.7 C)     TempSrc: Oral     SpO2: 98%  95% 95%  Weight:  71.7 kg    Height:  5' 5"  (1.651 m)     Constitutional: NAD, calm, comfortable Eyes: PERRL, EOMI, lids and conjunctivae normal ENMT: Mucous membranes are moist.  Hearing grossly normal. Respiratory: clear to auscultation bilaterally, no wheezing, no crackles. Normal respiratory effort. No accessory muscle use.  Cardiovascular: Regular rate and rhythm, no murmurs / rubs / gallops. No extremity edema. 2+ pedal pulses. No carotid bruits.  Abdomen: no tenderness, soft, non-distended. Musculoskeletal: no clubbing / cyanosis. No joint deformity upper and lower extremities. ormal  muscle tone.  Skin: dry, intact, normal temperature Neurologic: CN 2-12 grossly intact. Sensation subjectively decreased to light touch at left shoulder to hand but otherwise equal and symmetric. Strength 4+/5 in all 4 extremities suspect poor effort.  Psychiatric: Normal judgment and insight. Alert and oriented x 3. Normal mood.   Labs on Admission: I have personally reviewed following labs and imaging studies  CBC: Recent Labs  Lab 02/18/19 1406  WBC 9.1  NEUTROABS 6.2  HGB 10.0*  HCT 31.6*  MCV 92.1  PLT 732   Basic Metabolic Panel: Recent Labs  Lab 02/18/19 1406  NA 139  K 4.0  CL 103  CO2 28  GLUCOSE 100*  BUN 13  CREATININE 1.54*  CALCIUM 9.2   GFR: Estimated Creatinine Clearance: 38.1 mL/min (A) (by C-G formula based on SCr of 1.54 mg/dL (H)). Liver Function Tests: Recent Labs  Lab 02/18/19 1406  AST 17  ALT 11  ALKPHOS 61  BILITOT 0.7  PROT 7.6  ALBUMIN 3.9   No results for input(s): LIPASE, AMYLASE in the last 168 hours. No results for input(s): AMMONIA in the last 168 hours. Coagulation Profile: No results for input(s): INR, PROTIME in the last 168 hours. Cardiac Enzymes: No results for input(s): CKTOTAL, CKMB, CKMBINDEX, TROPONINI in the last 168 hours. BNP (last 3 results) No results for input(s): PROBNP in the last 8760 hours. HbA1C: No results for input(s): HGBA1C in the last 72 hours. CBG: Recent Labs  Lab 02/18/19 1405  GLUCAP 99   Lipid Profile: No results for input(s): CHOL, HDL, LDLCALC, TRIG, CHOLHDL, LDLDIRECT in the last 72 hours. Thyroid Function Tests: No results for input(s): TSH, T4TOTAL, FREET4, T3FREE, THYROIDAB in the last 72 hours. Anemia Panel: No results for input(s): VITAMINB12, FOLATE, FERRITIN, TIBC, IRON, RETICCTPCT in the last 72 hours. Urine analysis:    Component Value Date/Time   COLORURINE YELLOW (A) 02/18/2019 1615   APPEARANCEUR CLEAR (A) 02/18/2019 1615   APPEARANCEUR Clear 12/02/2018 1053   LABSPEC  1.004 (L) 02/18/2019 1615   LABSPEC 1.009  06/27/2014 1414   PHURINE 6.0 02/18/2019 1615   GLUCOSEU NEGATIVE 02/18/2019 1615   GLUCOSEU Negative 06/27/2014 1414   HGBUR NEGATIVE 02/18/2019 [place] 02/18/2019 1615   BILIRUBINUR Negative 12/02/2018 1053   BILIRUBINUR Negative 06/27/2014 [place] 02/18/2019 1615   PROTEINUR NEGATIVE 02/18/2019 1615   NITRITE NEGATIVE 02/18/2019 1615   LEUKOCYTESUR NEGATIVE 02/18/2019 1615   LEUKOCYTESUR Negative 06/27/2014 1414    Radiological Exams on Admission: Ct Head Wo Contrast  Result Date: 02/18/2019 CLINICAL DATA:  Recurrent dizzy spells and falling. Initial encounter. EXAM: CT HEAD WITHOUT CONTRAST CT CERVICAL SPINE WITHOUT CONTRAST TECHNIQUE: Multidetector CT imaging of the head and cervical spine was performed following the standard protocol without intravenous contrast. Multiplanar CT image reconstructions of the cervical spine were also generated. COMPARISON:  Plain film cervical spine 03/09/2013. FINDINGS: CT HEAD FINDINGS Brain: No evidence of acute infarction, hemorrhage, hydrocephalus, extra-axial collection or mass lesion/mass effect. Vascular: No hyperdense vessel or unexpected calcification. Skull: Intact.  No focal lesion. Sinuses/Orbits: Negative. Other: None. CT CERVICAL SPINE FINDINGS Alignment: Maintained. Skull base and vertebrae: No acute fracture. No primary bone lesion or focal pathologic process. The patient is status post solid C5-6 ACDF. Soft tissues and spinal canal: No prevertebral fluid or swelling. No visible canal hematoma. Disc levels:  Shallow disc protrusions are seen at C4-5 and C6-7. Upper chest: Negative. Other: None. IMPRESSION: No acute abnormality head or cervical spine. Electronically Signed   By: Inge Rise M.D.   On: 02/18/2019 14:56   Ct Cervical Spine Wo Contrast  Result Date: 02/18/2019 CLINICAL DATA:  Recurrent dizzy spells and falling. Initial encounter. EXAM: CT HEAD  WITHOUT CONTRAST CT CERVICAL SPINE WITHOUT CONTRAST TECHNIQUE: Multidetector CT imaging of the head and cervical spine was performed following the standard protocol without intravenous contrast. Multiplanar CT image reconstructions of the cervical spine were also generated. COMPARISON:  Plain film cervical spine 03/09/2013. FINDINGS: CT HEAD FINDINGS Brain: No evidence of acute infarction, hemorrhage, hydrocephalus, extra-axial collection or mass lesion/mass effect. Vascular: No hyperdense vessel or unexpected calcification. Skull: Intact.  No focal lesion. Sinuses/Orbits: Negative. Other: None. CT CERVICAL SPINE FINDINGS Alignment: Maintained. Skull base and vertebrae: No acute fracture. No primary bone lesion or focal pathologic process. The patient is status post solid C5-6 ACDF. Soft tissues and spinal canal: No prevertebral fluid or swelling. No visible canal hematoma. Disc levels:  Shallow disc protrusions are seen at C4-5 and C6-7. Upper chest: Negative. Other: None. IMPRESSION: No acute abnormality head or cervical spine. Electronically Signed   By: Inge Rise M.D.   On: 02/18/2019 14:56    EKG: Independently reviewed.   Assessment/Plan   Dizziness Multiple Falls secondary to dizziness Etiology is to be determined, differential includes posterior stroke, vertigo, atypical migraine, orthostatic hypotension / labile BP less likely as patient symptomatic in ED with normal BP.  CT head negative.  No focal deficits on exam other than subjective sensory deficit at left shoulder to hand.   - MRI brain w/o contrast - Carotid dopplers - Lipid panel, TSH, A1C with AM labs - Neurology consult - start ASA - PT/OT evals - passed swallow, diet ordered  Headache History of Migraine Headaches Current headache x several days, persistent, unlike usual migraines per patient.  Work up as above. 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 -  continue home oxycodone PRN - continue home gabapentin - continue home Zanaflex - continue home Cymbalta  COPD - not acutely exacerbated - cont Albuterol HFA, Duonebs PRN - sub Dulera for home Symbicort  Seasonal Allergies - sub loratadine for home Zyrtec - start Flonase  Insomnia - continue home Trazadone  Depression/Anxiety - continue home Effexor, Cymbalta   DVT prophylaxis: Heparin  Code Status: Full Family Communication: none at bedside  Disposition Plan: expect d/c home in 24-48 hours, pending stroke eval  Consults called: None  Admission status: [place], DO [facility] Pager [phone]  If 7PM-7AM, please contact night-coverage [email or web address] Password [email or web address]  02/18/2019, 5:25 PM

## 2019-02-18 NOTE — ED Triage Notes (Signed)
Pt arrived from [facility] via [facility]. They called EMS because pt was c/o dizzy spells and was losing balance and falling. Pt stated she started having a headache this past weekend. EMS gave 500 ml bolus NS. Pt had BG of 150.

## 2019-02-19 ENCOUNTER — Observation Stay: Payer: PRIVATE HEALTH INSURANCE

## 2019-02-19 DIAGNOSIS — Z20828 Contact with and (suspected) exposure to other viral communicable diseases: Secondary | ICD-10-CM | POA: Diagnosis present

## 2019-02-19 DIAGNOSIS — M545 Low back pain: Secondary | ICD-10-CM | POA: Diagnosis present

## 2019-02-19 DIAGNOSIS — M419 Scoliosis, unspecified: Secondary | ICD-10-CM | POA: Diagnosis present

## 2019-02-19 DIAGNOSIS — I10 Essential (primary) hypertension: Secondary | ICD-10-CM | POA: Diagnosis not present

## 2019-02-19 DIAGNOSIS — G4700 Insomnia, unspecified: Secondary | ICD-10-CM | POA: Diagnosis present

## 2019-02-19 DIAGNOSIS — N179 Acute kidney failure, unspecified: Secondary | ICD-10-CM | POA: Diagnosis present

## 2019-02-19 DIAGNOSIS — Z809 Family history of malignant neoplasm, unspecified: Secondary | ICD-10-CM | POA: Diagnosis not present

## 2019-02-19 DIAGNOSIS — R519 Headache, unspecified: Secondary | ICD-10-CM | POA: Diagnosis not present

## 2019-02-19 DIAGNOSIS — G253 Myoclonus: Secondary | ICD-10-CM | POA: Diagnosis present

## 2019-02-19 DIAGNOSIS — Z8349 Family history of other endocrine, nutritional and metabolic diseases: Secondary | ICD-10-CM | POA: Diagnosis not present

## 2019-02-19 DIAGNOSIS — Z825 Family history of asthma and other chronic lower respiratory diseases: Secondary | ICD-10-CM | POA: Diagnosis not present

## 2019-02-19 DIAGNOSIS — J302 Other seasonal allergic rhinitis: Secondary | ICD-10-CM | POA: Diagnosis present

## 2019-02-19 DIAGNOSIS — B9681 Helicobacter pylori [H. pylori] as the cause of diseases classified elsewhere: Secondary | ICD-10-CM | POA: Diagnosis present

## 2019-02-19 DIAGNOSIS — I129 Hypertensive chronic kidney disease with stage 1 through stage 4 chronic kidney disease, or unspecified chronic kidney disease: Secondary | ICD-10-CM | POA: Diagnosis present

## 2019-02-19 DIAGNOSIS — R42 Dizziness and giddiness: Secondary | ICD-10-CM | POA: Diagnosis present

## 2019-02-19 DIAGNOSIS — M542 Cervicalgia: Secondary | ICD-10-CM | POA: Diagnosis present

## 2019-02-19 DIAGNOSIS — F419 Anxiety disorder, unspecified: Secondary | ICD-10-CM | POA: Diagnosis present

## 2019-02-19 DIAGNOSIS — G43909 Migraine, unspecified, not intractable, without status migrainosus: Secondary | ICD-10-CM | POA: Diagnosis present

## 2019-02-19 DIAGNOSIS — Z8249 Family history of ischemic heart disease and other diseases of the circulatory system: Secondary | ICD-10-CM | POA: Diagnosis not present

## 2019-02-19 DIAGNOSIS — R296 Repeated falls: Secondary | ICD-10-CM | POA: Diagnosis present

## 2019-02-19 DIAGNOSIS — F418 Other specified anxiety disorders: Secondary | ICD-10-CM | POA: Diagnosis present

## 2019-02-19 DIAGNOSIS — Z8261 Family history of arthritis: Secondary | ICD-10-CM | POA: Diagnosis not present

## 2019-02-19 DIAGNOSIS — G894 Chronic pain syndrome: Secondary | ICD-10-CM | POA: Diagnosis present

## 2019-02-19 DIAGNOSIS — F329 Major depressive disorder, single episode, unspecified: Secondary | ICD-10-CM | POA: Diagnosis present

## 2019-02-19 DIAGNOSIS — J449 Chronic obstructive pulmonary disease, unspecified: Secondary | ICD-10-CM | POA: Diagnosis present

## 2019-02-19 DIAGNOSIS — N183 Chronic kidney disease, stage 3 unspecified: Secondary | ICD-10-CM | POA: Diagnosis present

## 2019-02-19 LAB — BASIC METABOLIC PANEL
Anion gap: 7 (ref 5–15)
BUN: 10 mg/dL (ref 8–23)
CO2: 29 mmol/L (ref 22–32)
Calcium: 8.6 mg/dL — ABNORMAL LOW (ref 8.9–10.3)
Chloride: 105 mmol/L (ref 98–111)
Creatinine, Ser: 1.11 mg/dL — ABNORMAL HIGH (ref 0.44–1.00)
GFR calc Af Amer: >60 mL/min (ref >60)
GFR calc non Af Amer: 54 mL/min — ABNORMAL LOW (ref >60)
Glucose, Bld: 92 mg/dL (ref 70–99)
Potassium: 3.5 mmol/L (ref 3.5–5.1)
Sodium: 141 mmol/L (ref 135–145)

## 2019-02-19 LAB — LIPID PANEL
Cholesterol: 133 mg/dL (ref 0–200)
HDL: 52 mg/dL (ref >40)
LDL Cholesterol: 67 mg/dL (ref 0–99)
Total CHOL/HDL Ratio: 2.6 RATIO
Triglycerides: 71 mg/dL (ref <150)
VLDL: 14 mg/dL (ref 0–40)

## 2019-02-19 LAB — HEMOGLOBIN A1C
Hgb A1c MFr Bld: 5.5 % (ref 4.8–5.6)
Mean Plasma Glucose: 111.15 mg/dL

## 2019-02-19 LAB — SARS CORONAVIRUS 2 (TAT 6-24 HRS): SARS Coronavirus 2: NEGATIVE

## 2019-02-19 LAB — CBC
HCT: 30.2 % — ABNORMAL LOW (ref 36.0–46.0)
Hemoglobin: 10.3 g/dL — ABNORMAL LOW (ref 12.0–15.0)
MCH: 29.9 pg (ref 26.0–34.0)
MCHC: 34.1 g/dL (ref 30.0–36.0)
MCV: 87.8 fL (ref 80.0–100.0)
Platelets: 338 10*3/uL (ref 150–400)
RBC: 3.44 MIL/uL — ABNORMAL LOW (ref 3.87–5.11)
RDW: 13.1 % (ref 11.5–15.5)
WBC: 7.6 10*3/uL (ref 4.0–10.5)
nRBC: 0 % (ref 0.0–0.2)

## 2019-02-19 LAB — TSH: TSH: 0.58 u[IU]/mL (ref 0.350–4.500)

## 2019-02-19 MED ORDER — POTASSIUM CHLORIDE CRYS ER 20 MEQ PO TBCR
40.0000 meq | EXTENDED_RELEASE_TABLET | Freq: Once | ORAL | Status: AC
  Administered 2019-02-19: 40 meq via ORAL
  Filled 2019-02-19: qty 2

## 2019-02-19 MED ORDER — MECLIZINE HCL 25 MG PO TABS
25.0000 mg | ORAL_TABLET | Freq: Three times a day (TID) | ORAL | Status: DC | PRN
  Filled 2019-02-19: qty 1

## 2019-02-19 MED ORDER — GABAPENTIN 400 MG PO CAPS
400.0000 mg | ORAL_CAPSULE | Freq: Three times a day (TID) | ORAL | Status: DC
  Administered 2019-02-19: 400 mg via ORAL
  Filled 2019-02-19 (×3): qty 1

## 2019-02-19 NOTE — Consult Note (Signed)
Reason for Consult:Dizziness Referring Physician: Arbutus Ped  CC: Tremors  HPI: Rachael Jordan is an 63 y.o. female with a history of chronic pain who presents reporting that for the past week "things have not been right".  Starting Saturday she began to have pain in the back of her neck.  This pain radiated down her spine, into her right hip, right abdomen and then RLE.  This pain is dull but severe and rated at a 10/10.  Due to this increased pain she has been taking her Valacyclovir and Neurontin that she does not take regularly.  Since Monday has had tremors.  The tremor have become so severe that she has been thrown to the ground.  Reports that she fell 5 times in one day on the day of presentation.  She was unable to catch herself.  Also reports tremors in her hands.  No bowel or bladder incontinence.   There have been increased stressors in the family recently with a young niece dying in a MVA leaving her children without parents. When describing this she becomes very tearful.    Past Medical History:  Diagnosis Date  . Acid reflux   . Anxiety   . Arthritis   . Bronchitis 09/2015  . Gross hematuria   . H. pylori infection   . Heart murmur   . Kidney failure   . 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 Lung abnormality    "damaged lung due to pneumonia"  . Neck pain   . Nephrolithiasis   . Pneumonia   . Pneumothorax   . Scoliosis   . Urinary frequency   . Vaginal atrophy     Past Surgical History:  Procedure Laterality Date  . ABDOMINAL HYSTERECTOMY     vaginal  . APPENDECTOMY    . COLONOSCOPY WITH PROPOFOL N/A 12/29/2018   Procedure: COLONOSCOPY WITH PROPOFOL;  Surgeon: Lucilla Lame, MD;  Location: [facility] ENDOSCOPY;  Service: Endoscopy;  Laterality: N/A;  . FOOT SURGERY Left    10/2015  . NECK SURGERY      Family History  Problem Relation Age of Onset  . Arthritis Mother   . Asthma Mother   . Hyperlipidemia Mother   . Hypertension Mother   . Diabetes Sister    . Cancer Maternal Aunt        esophagus  . Breast cancer Neg Hx   . Kidney cancer Neg Hx   . Bladder Cancer Neg Hx     Social History:  reports that she has never smoked. She has never used smokeless tobacco. She reports that she does not drink alcohol or use drugs.  Allergies  Allergen Reactions  . Grapefruit Bioflavonoid Complex Rash  . Grapefruit Extract Rash  . Propoxyphene Nausea And Vomiting and Nausea Only  . Prednisolone     Psychotic features    Medications:  I have reviewed the patient's current medications. Prior to Admission:  Prior to Admission medications   Medication Sig Start Date End Date Taking? Authorizing Provider  albuterol (PROVENTIL) (2.5 MG/3ML) 0.083% nebulizer solution Take 2.5 mg by nebulization every 6 (six) hours as needed for wheezing or shortness of breath.   Yes [provider]  aspirin EC 81 MG tablet Take 81 mg by mouth daily.   Yes [provider]  atorvastatin (LIPITOR) 20 MG tablet Take 20 mg by mouth every evening.    Yes [provider]  azelastine (ASTELIN) 0.1 % nasal spray Place 1-2 sprays into both nostrils 2 (two) times daily.  Yes [provider]  Biotin 10000 MCG TABS Take 10 mg by mouth daily.    Yes [provider]  budesonide-formoterol (SYMBICORT) 80-4.5 MCG/ACT inhaler Inhale 2 puffs into the lungs 2 (two) times daily. 11/09/18  Yes Allyne Gee, MD  Calcium Carbonate-Vitamin D3 600-400 MG-UNIT TABS Take 1 tablet by mouth daily.    Yes [provider]  cefdinir (OMNICEF) 300 MG capsule Take 300 mg by mouth 2 (two) times daily. 02/10/19 02/20/19 Yes [provider]  cetirizine (ZYRTEC) 10 MG tablet Take 10 mg by mouth at bedtime.    Yes [provider]  Cholecalciferol (VITAMIN D-1000 MAX ST) 25 MCG (1000 UT) tablet Take 1,000 Units by mouth daily.    Yes [provider]  docusate sodium (COLACE) 100 MG capsule Take 100 mg by mouth daily as needed for  mild constipation.    Yes [provider]  DULoxetine (CYMBALTA) 30 MG capsule Take 30 mg by mouth daily.  09/01/18  Yes [provider]  gabapentin (NEURONTIN) 800 MG tablet Take 800 mg by mouth 3 (three) times daily. 02/20/17 02/18/19 Yes [provider]  hydrocortisone 2.5 % ointment Apply 1 application topically 2 (two) times daily as needed (skin dryness/itching).    Yes [provider]  ipratropium-albuterol (DUONEB) 0.5-2.5 (3) MG/3ML SOLN Take 3 mLs by nebulization every 4 (four) hours as needed. 01/13/15  Yes Triplett, Cari B, FNP  montelukast (SINGULAIR) 10 MG tablet Take 10 mg by mouth at bedtime.    Yes [provider]  Multiple Vitamins-Minerals (CENTRUM ADULTS PO) Take 1 tablet by mouth daily.    Yes [provider]  nabumetone (RELAFEN) 750 MG tablet Take 750 mg by mouth 2 (two) times daily as needed for mild pain or moderate pain.    Yes [provider]  olmesartan (BENICAR) 20 MG tablet Take 10 mg by mouth daily. 09/25/18 09/25/19 Yes [provider]  oxyCODONE (ROXICODONE) 15 MG immediate release tablet Take 15 mg by mouth 4 (four) times daily as needed (severe pain).    Yes [provider]  oxyCODONE-acetaminophen (PERCOCET/ROXICET) 5-325 MG tablet Take 1-2 tablets by mouth every 4 (four) hours as needed for moderate pain or severe pain. 01/01/19  Yes Lateef, Munsoor, MD  promethazine (PHENERGAN) 25 MG tablet Take 1 tablet (25 mg total) by mouth every 6 (six) hours as needed for nausea or vomiting. 01/01/19  Yes Lateef, Munsoor, MD  rizatriptan (MAXALT) 10 MG tablet Take 10 mg by mouth as needed for migraine. May repeat in 2 hours if needed   Yes [provider]  tiZANidine (ZANAFLEX) 4 MG tablet Take 4 mg by mouth 4 (four) times daily as needed for muscle spasms.    Yes [provider]  traZODone (DESYREL) 100 MG tablet Take 200 mg by mouth at bedtime.    Yes [provider]   trospium (SANCTURA) 20 MG tablet Take 20 mg by mouth 2 (two) times daily.  08/28/18 08/28/19 Yes [provider]  valACYclovir (VALTREX) 1000 MG tablet Take 1,000 mg by mouth daily.    Yes [provider]  venlafaxine XR (EFFEXOR-XR) 37.5 MG 24 hr capsule Take 37.5 mg by mouth daily.  06/19/18 06/19/19 Yes [provider]  vitamin C (ASCORBIC ACID) 500 MG tablet Take 500 mg by mouth daily.   Yes [provider]     Scheduled: . aspirin EC  81 mg Oral Daily  . atorvastatin  20 mg Oral Daily  .  azelastine  1 spray Each Nare BID  . darifenacin  7.5 mg Oral Daily  . DULoxetine  30 mg Oral Daily  . fluticasone  2 spray Each Nare Daily  . heparin  5,000 Units Subcutaneous Q8H  . irbesartan  150 mg Oral Daily  . loratadine  10 mg Oral Daily  . mometasone-formoterol  2 puff Inhalation BID  . montelukast  10 mg Oral Daily  . traZODone  100 mg Oral QHS  . venlafaxine XR  37.5 mg Oral Daily    ROS: History obtained from the patient  General ROS: hot sweats Psychological ROS: negative for - behavioral disorder, hallucinations, memory difficulties, mood swings or suicidal ideation Ophthalmic ROS: negative for - blurry vision, double vision, eye pain or loss of vision ENT ROS: negative for - epistaxis, nasal discharge, oral lesions, sore throat, tinnitus or vertigo Allergy and Immunology ROS: negative for - hives or itchy/watery eyes Hematological and Lymphatic ROS: negative for - bleeding problems, bruising or swollen lymph nodes Endocrine ROS: negative for - galactorrhea, hair pattern changes, polydipsia/polyuria or temperature intolerance Respiratory ROS: negative for - cough, hemoptysis, shortness of breath or wheezing Cardiovascular ROS: negative for - chest pain, dyspnea on exertion, edema or irregular heartbeat Gastrointestinal ROS: abdominal pain Genito-Urinary ROS: negative for - dysuria, hematuria, incontinence or urinary  frequency/urgency Musculoskeletal ROS: neck pain, back pain, hip pain Neurological ROS: as noted in HPI Dermatological ROS: negative for rash and skin lesion changes  Physical Examination: Blood pressure (!) 148/94, pulse 78, temperature 98.2 F (36.8 C), temperature source Oral, resp. rate 20, height 5' 5"  (1.651 m), weight 71.7 kg, SpO2 95 %.  HEENT-  Normocephalic, no lesions, without obvious abnormality.  Normal external eye and conjunctiva.  Normal TM's bilaterally.  Normal auditory canals and external ears. Normal external nose, mucus membranes and septum.  Normal pharynx. Cardiovascular- S1, S2 normal, pulses palpable throughout   Lungs- chest clear, no wheezing, rales, normal symmetric air entry Abdomen- soft, non-tender; bowel sounds normal; no masses,  no organomegaly Extremities- no edema Lymph-no adenopathy palpable Musculoskeletal-some hip pain on palpation Skin-warm and dry, no hyperpigmentation, vitiligo, or suspicious lesions.  Despite report of frequent falls only one small bruise noted below left knee.    Neurological Examination   Mental Status: Alert, oriented, thought content appropriate.  Speech fluent without evidence of aphasia.  Able to follow 3 step commands without difficulty. Cranial Nerves: II: Discs flat bilaterally; Visual fields grossly normal, pupils equal, round, reactive to light and accommodation III,IV, VI: ptosis not present, extra-ocular motions intact bilaterally V,VII: smile symmetric, facial light touch sensation normal bilaterally VIII: hearing normal bilaterally IX,X: gag reflex present XI: bilateral shoulder shrug XII: midline tongue extension Motor: Right : Upper extremity   5/5    Left:     Upper extremity   5/5  Lower extremity   5/5     Lower extremity   5/5 When not speaking about tremor or specifically testing for tremor patient able to move in bed and perform activities with no tremor noted.  Otherwise tremor noted in all  extremities.  Not consistent.  At times tremor in upper extremities as if waving a finger.  When asked to relax lower extremities tremor resolves completely despite no change in position.  Normal tone and bulk Sensory: Pinprick and light touch decreased in the RLE Deep Tendon Reflexes: Symmetric throughout Plantars: Right: mute   Left: mute Cerebellar: Tremor with finger-to-nose and heel-to-shin testing  Gait: not tested due to  safety concerns  Laboratory Studies:   Basic Metabolic Panel: Recent Labs  Lab 02/18/19 1406 02/19/19 0516  NA 139 141  K 4.0 3.5  CL 103 105  CO2 28 29  GLUCOSE 100* 92  BUN 13 10  CREATININE 1.54* 1.11*  CALCIUM 9.2 8.6*    Liver Function Tests: Recent Labs  Lab 02/18/19 1406  AST 17  ALT 11  ALKPHOS 61  BILITOT 0.7  PROT 7.6  ALBUMIN 3.9   No results for input(s): LIPASE, AMYLASE in the last 168 hours. No results for input(s): AMMONIA in the last 168 hours.  CBC: Recent Labs  Lab 02/18/19 1406 02/19/19 0516  WBC 9.1 7.6  NEUTROABS 6.2  --   HGB 10.0* 10.3*  HCT 31.6* 30.2*  MCV 92.1 87.8  PLT 351 338    Cardiac Enzymes: No results for input(s): CKTOTAL, CKMB, CKMBINDEX, TROPONINI in the last 168 hours.  BNP: Invalid input(s): POCBNP  CBG: Recent Labs  Lab 02/18/19 4650  PTWSFK 81    Microbiology: Results for orders placed or performed during the hospital encounter of 02/18/19  SARS CORONAVIRUS 2 (TAT 6-24 HRS) Nasopharyngeal Nasopharyngeal Swab     Status: None   Collection Time: 02/18/19  4:15 PM   Specimen: Nasopharyngeal Swab  Result Value Ref Range Status   SARS Coronavirus 2 NEGATIVE NEGATIVE Final    Comment: (NOTE) SARS-CoV-2 target nucleic acids are NOT DETECTED. The SARS-CoV-2 RNA is generally detectable in upper and lower respiratory specimens during the acute phase of infection. Negative results do not preclude SARS-CoV-2 infection, do not rule out co-infections with other pathogens, and should not be  used as the sole basis for treatment or other patient management decisions. Negative results must be combined with clinical observations, patient history, and epidemiological information. The expected result is Negative. Fact Sheet for Patients: SugarRoll.be Fact Sheet for Healthcare Providers: https://www.woods-mathews.com/ This test is not yet approved or cleared by the [place] FDA and  has been authorized for detection and/or diagnosis of SARS-CoV-2 by FDA under an Emergency Use Authorization (EUA). This EUA will remain  in effect (meaning this test can be used) for the duration of the COVID-19 declaration under Section 56 4(b)(1) of the Act, 21 U.S.C. section 360bbb-3(b)(1), unless the authorization is terminated or revoked sooner. Performed at [facility], [place] [address], [place], [place] [address]     Coagulation Studies: No results for input(s): LABPROT, INR in the last 72 hours.  Urinalysis:  Recent Labs  Lab 02/18/19 1615  COLORURINE YELLOW*  LABSPEC 1.004*  PHURINE 6.0  GLUCOSEU NEGATIVE  HGBUR NEGATIVE  BILIRUBINUR NEGATIVE  KETONESUR NEGATIVE  PROTEINUR NEGATIVE  NITRITE NEGATIVE  LEUKOCYTESUR NEGATIVE    Lipid Panel:     Component Value Date/Time   CHOL 133 02/19/2019 0516   TRIG 71 02/19/2019 0516   HDL 52 02/19/2019 0516   CHOLHDL 2.6 02/19/2019 0516   VLDL 14 02/19/2019 0516   LDLCALC 67 02/19/2019 0516    HgbA1C: No results found for: HGBA1C  Urine Drug Screen:      Component Value Date/Time   COCAINSCRNUR Negative 06/06/2017 0736   LABBARB Negative 06/06/2017 0736    Alcohol Level: No results for input(s): ETH in the last 168 hours.  Other results: EKG: sinus rhythm at 86 bpm.  Imaging: Ct Head Wo Contrast  Result Date: 02/18/2019 CLINICAL DATA:  Recurrent dizzy spells and falling. Initial encounter. EXAM: CT HEAD WITHOUT CONTRAST CT CERVICAL SPINE WITHOUT CONTRAST TECHNIQUE:  Multidetector CT imaging of the head and cervical spine was performed following the standard protocol without intravenous contrast. Multiplanar CT image reconstructions of the cervical spine were also generated. COMPARISON:  Plain film cervical spine 03/09/2013. FINDINGS: CT HEAD FINDINGS Brain: No evidence of acute infarction, hemorrhage, hydrocephalus, extra-axial collection or mass lesion/mass effect. Vascular: No hyperdense vessel or unexpected calcification. Skull: Intact.  No focal lesion. Sinuses/Orbits: Negative. Other: None. CT CERVICAL SPINE FINDINGS Alignment: Maintained. Skull base and vertebrae: No acute fracture. No primary bone lesion or focal pathologic process. The patient is status post solid C5-6 ACDF. Soft tissues and spinal canal: No prevertebral fluid or swelling. No visible canal hematoma. Disc levels:  Shallow disc protrusions are seen at C4-5 and C6-7. Upper chest: Negative. Other: None. IMPRESSION: No acute abnormality head or cervical spine. Electronically Signed   By: Inge Rise M.D.   On: 02/18/2019 14:56   Ct Cervical Spine Wo Contrast  Result Date: 02/18/2019 CLINICAL DATA:  Recurrent dizzy spells and falling. Initial encounter. EXAM: CT HEAD WITHOUT CONTRAST CT CERVICAL SPINE WITHOUT CONTRAST TECHNIQUE: Multidetector CT imaging of the head and cervical spine was performed following the standard protocol without intravenous contrast. Multiplanar CT image reconstructions of the cervical spine were also generated. COMPARISON:  Plain film cervical spine 03/09/2013. FINDINGS: CT HEAD FINDINGS Brain: No evidence of acute infarction, hemorrhage, hydrocephalus, extra-axial collection or mass lesion/mass effect. Vascular: No hyperdense vessel or unexpected calcification. Skull: Intact.  No focal lesion. Sinuses/Orbits: Negative. Other: None. CT CERVICAL SPINE FINDINGS Alignment: Maintained. Skull base and vertebrae: No acute fracture. No primary bone lesion or focal pathologic  process. The patient is status post solid C5-6 ACDF. Soft tissues and spinal canal: No prevertebral fluid or swelling. No visible canal hematoma. Disc levels:  Shallow disc protrusions are seen at C4-5 and C6-7. Upper chest: Negative. Other: None. IMPRESSION: No acute abnormality head or cervical spine. Electronically Signed   By: Inge Rise M.D.   On: 02/18/2019 14:56   Mr Brain Wo Contrast  Result Date: 02/18/2019 CLINICAL DATA:  63 year old female with dizziness, constant headache for 2 days. Loss of balance, with fall this morning. EXAM: MRI HEAD WITHOUT CONTRAST TECHNIQUE: Multiplanar, multiecho pulse sequences of the brain and surrounding structures were obtained without intravenous contrast. COMPARISON:  Head and cervical spine CT earlier Today.Brain MRI and intracranial MRA 12/29/2017. FINDINGS: Brain: Cerebral volume remains normal for age. No restricted diffusion to suggest acute infarction. No midline shift, mass effect, evidence of mass lesion, ventriculomegaly, extra-axial collection or acute intracranial hemorrhage. Cervicomedullary junction and pituitary are within normal limits. 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 Mild for age other scattered nonspecific cerebral white matter T2 and FLAIR hyperintensity. The remaining deep gray nuclei, brainstem and cerebellum are normal for age. No cortical encephalomalacia or chronic cerebral blood products identified. Vascular: Major intracranial vascular flow voids are stable. Skull and upper cervical spine: Negative visible cervical spine. Normal bone marrow signal. Sinuses/Orbits: Negative orbits. Paranasal sinuses and mastoids are stable and well pneumatized. Other: Visible internal auditory structures appear normal. Normal stylomastoid foramina. Scalp and face soft tissues are within normal limits. IMPRESSION: 1. No acute intracranial abnormality. 2. Mild for age chronic small vessel disease appears stable since 2019.  Electronically Signed   By: Genevie Ann M.D.   On: 02/18/2019 20:39   US Carotid Bilateral  Result Date: 02/19/2019 CLINICAL DATA:  Dizziness.  Hypertension,. EXAM: BILATERAL CAROTID DUPLEX ULTRASOUND TECHNIQUE: Pearline Cables scale imaging, color Doppler and duplex  ultrasound were performed of bilateral carotid and vertebral arteries in the neck. COMPARISON:  None. FINDINGS: Criteria: Quantification of carotid stenosis is based on velocity parameters that correlate the residual internal carotid diameter with NASCET-based stenosis levels, using the diameter of the distal internal carotid lumen as the denominator for stenosis measurement. The following velocity measurements were obtained: RIGHT ICA: 99/36 cm/sec CCA: 07/62 cm/sec SYSTOLIC ICA/CCA RATIO:  1.5 ECA: 92 cm/sec LEFT ICA: 123/48 cm/sec CCA: 26/33 cm/sec SYSTOLIC ICA/CCA RATIO:  1.9 ECA: 54 cm/sec RIGHT CAROTID ARTERY: Mild eccentric noncalcified plaque in the bulb. No high-grade stenosis. Normal waveforms and color Doppler signal. ICA mildly tortuous. RIGHT VERTEBRAL ARTERY:  Normal flow direction and waveform. LEFT CAROTID ARTERY: Mild intimal thickening in the proximal common carotid artery. Eccentric noncalcified plaque in the carotid bulb. No high-grade stenosis. Normal waveforms and color Doppler signal. Tortuous ICA. LEFT VERTEBRAL ARTERY:  Normal flow direction and waveform. IMPRESSION: 1. 1. 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 2. Antegrade bilateral vertebral arterial flow. Electronically Signed   By: Lucrezia Europe M.D.   On: 02/19/2019 10:44     Assessment/Plan: 63 year old female with a history of chronic pain presenting with neck, back and hip pain along with tremors affecting ADL's.  Neurological examination does exhibit some supratentorial features.  MRI of the brain reviewed and shows no acute changes.  CT of her cervical spine shows her C5-6 ACDF but no acute changes or cord abnormalities.  TSH  normal.  Patient does have some mild renal insufficiency that was not present earlier in the year and is using her Neurontin more and this may be causing some abnormal involuntary movements.  Will evaluate her hip as well.    Recommendations: 1. D/C Neurontin and Tramadol 2. Plain films of the right hip 3. If no improvement in the next 24-48 hours would consider further medication discontinuation 4. Will continue to follow with you   Alexis Goodell, MD Neurology [phone] 02/19/2019, 12:30 PM

## 2019-02-19 NOTE — Progress Notes (Signed)
OT Cancellation Note  Patient Details Name: Rachael Jordan MRN: [id number] DOB: 03/15/1956   Cancelled Treatment:    Reason Eval/Treat Not Completed: Other (comment)  OT order received and chart reviewed. Per PT, upon discussion with attending MD, it's requested that therapy hold as neuro is pending workup. Will f/u next scheduled date as appropriate. Thank you.   Gerrianne Scale, [place], OTR/L ascom [phone] 02/19/19, 5:00 PM

## 2019-02-19 NOTE — Progress Notes (Addendum)
PROGRESS NOTE    Rachael Jordan  YVO:[id number] DOB: 1955-03-31 DOA: 02/18/2019  PCP: Rachael Peach, MD    LOS - 0   Brief Narrative:  63 y.o. female with medical history significant of HTN, chronic pain, CKD stage III.  She presented to the ED by EMS from her PCP's office due to persistent dizziness and headache for past several days in addition to all extremities feeling weak and having spasms / jerking intermittently, and weak grip causing her to drop objects.  Multiple falls related to dizziness over past 24 hours as well, which prompted her to call her PCP.  Concern for posterior stroke, admitted for further evaluation and management.  MRI brain, CT head, and CT cervical spine negative.  Neurology following.   Subjective 12/4: Patient seen awake in bed.  No acute events overnight.  She reports starting to develop another headache, and still have some neck pain.  Continues to have some tremor or random jerking of extremities.  No fevers/chills or other acute complaints.   Assessment & Plan:   Principal Problem:   Dizziness Active Problems:   Chronic pain syndrome   Essential hypertension   Dizziness Multiple Falls secondary to dizziness Tremors vs Myoclonic Jerkings, possibly due to medication side effect and renal insufficiency Etiology is to be determined, differential includes posterior stroke, vertigo, atypical migraine, orthostatic hypotension / labile BP less likely as patient symptomatic in ED with normal BP.  CT head negative.  No focal deficits on exam other than subjective sensory deficit at left shoulder to hand.   - MRI brain w/o contrast - negative - Carotid dopplers - less than 50% stenosis b/l - Neurology following - continue ASA, statin - PT/OT eval  Headache History of Migraine Headaches Current headache x several days, persistent, unlike usual migraines per patient.  Work up as above. 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 - continue home oxycodone PRN - stop gabapentin and tramadol - continue home Zanaflex - continue home Cymbalta  COPD - not acutely exacerbated - cont Albuterol HFA, Duonebs PRN - sub Dulera for home Symbicort  Seasonal Allergies  - sub loratadine for home Zyrtec - start Flonase  Insomnia - continue home Trazadone  Depression/Anxiety - continue home Effexor, Cymbalta   Inpatient status based on continuing medical work up and treatment as above.  Patient presented with recurrent falls secondary to dizziness and new onset tremors.  Will require continued hospital stay to monitor results of medication changes and additional evaluation per neurology.   DVT prophylaxis: Heparin  Code Status: Full Consultants: Neurology, Dr. Doy Mince Family Communication: sister at bedside   Disposition Plan: expect d/c home in 24-48 hours, pending Neuro clearance   Objective: Vitals:   02/18/19 1600 02/18/19 1700 02/19/19 1007 02/19/19 1414  BP: (!) 146/81 131/90 (!) 148/94 136/77  Pulse: 78 73 78 98  Resp: 10 (!) 8 20 20   Temp:   98.2 F (36.8 C)   TempSrc:   Oral   SpO2: 95% 95%  98%  Weight:      Height:        Intake/Output Summary (Last 24 hours) at 02/19/2019 1535 Last data filed at 02/19/2019 0245 Gross per 24 hour  Intake 450 ml  Output 450 ml  Net 0 ml   Filed Weights   02/18/19 1401  Weight: 71.7 kg  Examination:  General exam: awake, alert, no acute distress HEENT: atraumatic, moist mucus membranes, hearing grossly normal  Respiratory system: decreased breath sounds but clear to auscultation bilaterally, no wheezes, rales or rhonchi, normal respiratory effort. Cardiovascular system: normal S1/S2,  RRR, no JVD, murmurs, rubs, gallops, no pedal edema.   Gastrointestinal system: soft, non-tender, non-distended abdomen, normal bowel  sounds. Central nervous system: alert and oriented x4. no gross focal neurologic deficits, normal speech Extremities: moves all, no edema, normal tone Psychiatry: normal mood, congruent affect, judgement and insight appear normal   Data Reviewed: I have personally reviewed following labs and imaging studies  CBC: Recent Labs  Lab 02/18/19 1406 02/19/19 0516  WBC 9.1 7.6  NEUTROABS 6.2  --   HGB 10.0* 10.3*  HCT 31.6* 30.2*  MCV 92.1 87.8  PLT 351 250   Basic Metabolic Panel: Recent Labs  Lab 02/18/19 1406 02/19/19 0516  NA 139 141  K 4.0 3.5  CL 103 105  CO2 28 29  GLUCOSE 100* 92  BUN 13 10  CREATININE 1.54* 1.11*  CALCIUM 9.2 8.6*   GFR: Estimated Creatinine Clearance: 52.8 mL/min (A) (by C-G formula based on SCr of 1.11 mg/dL (H)). Liver Function Tests: Recent Labs  Lab 02/18/19 1406  AST 17  ALT 11  ALKPHOS 61  BILITOT 0.7  PROT 7.6  ALBUMIN 3.9   No results for input(s): LIPASE, AMYLASE in the last 168 hours. No results for input(s): AMMONIA in the last 168 hours. Coagulation Profile: No results for input(s): INR, PROTIME in the last 168 hours. Cardiac Enzymes: No results for input(s): CKTOTAL, CKMB, CKMBINDEX, TROPONINI in the last 168 hours. BNP (last 3 results) No results for input(s): PROBNP in the last 8760 hours. HbA1C: Recent Labs    02/19/19 0516  HGBA1C 5.5   CBG: Recent Labs  Lab 02/18/19 1405  GLUCAP 99   Lipid Profile: Recent Labs    02/19/19 0516  CHOL 133  HDL 52  LDLCALC 67  TRIG 71  CHOLHDL 2.6   Thyroid Function Tests: Recent Labs    02/19/19 0516  TSH 0.580   Anemia Panel: No results for input(s): VITAMINB12, FOLATE, FERRITIN, TIBC, IRON, RETICCTPCT in the last 72 hours. Sepsis Labs: No results for input(s): PROCALCITON, LATICACIDVEN in the last 168 hours.  Recent Results (from the past 240 hour(s))  SARS CORONAVIRUS 2 (TAT 6-24 HRS) Nasopharyngeal Nasopharyngeal Swab     Status: None   Collection Time:  02/18/19  4:15 PM   Specimen: Nasopharyngeal Swab  Result Value Ref Range Status   SARS Coronavirus 2 NEGATIVE NEGATIVE Final    Comment: (NOTE) SARS-CoV-2 target nucleic acids are NOT DETECTED. The SARS-CoV-2 RNA is generally detectable in upper and lower respiratory specimens during the acute phase of infection. Negative results do not preclude SARS-CoV-2 infection, do not rule out co-infections with other pathogens, and should not be used as the sole basis for treatment or other patient management decisions. Negative results must be combined with clinical observations, patient history, and epidemiological information. The expected result is Negative. Fact Sheet for Patients: SugarRoll.be Fact Sheet for Healthcare Providers: https://www.woods-mathews.com/ This test is not yet approved or cleared by the [place] FDA and  has been authorized for detection and/or diagnosis of SARS-CoV-2 by FDA under an Emergency Use Authorization (EUA). This EUA will remain  in effect (meaning this test can be used) for the duration of the COVID-19 declaration under Section 56 4(b)(1) of the Act, 21 U.S.C. section 360bbb-3(b)(1), unless  the authorization is terminated or revoked sooner. Performed at [facility], [place] [address] [place], [place] [address]          Radiology Studies: Ct Head Wo Contrast  Result Date: 02/18/2019 CLINICAL DATA:  Recurrent dizzy spells and falling. Initial encounter. EXAM: CT HEAD WITHOUT CONTRAST CT CERVICAL SPINE WITHOUT CONTRAST TECHNIQUE: Multidetector CT imaging of the head and cervical spine was performed following the standard protocol without intravenous contrast. Multiplanar CT image reconstructions of the cervical spine were also generated. COMPARISON:  Plain film cervical spine 03/09/2013. FINDINGS: CT HEAD FINDINGS Brain: No evidence of acute infarction, hemorrhage, hydrocephalus, extra-axial collection  or mass lesion/mass effect. Vascular: No hyperdense vessel or unexpected calcification. Skull: Intact.  No focal lesion. Sinuses/Orbits: Negative. Other: None. CT CERVICAL SPINE FINDINGS Alignment: Maintained. Skull base and vertebrae: No acute fracture. No primary bone lesion or focal pathologic process. The patient is status post solid C5-6 ACDF. Soft tissues and spinal canal: No prevertebral fluid or swelling. No visible canal hematoma. Disc levels:  Shallow disc protrusions are seen at C4-5 and C6-7. Upper chest: Negative. Other: None. IMPRESSION: No acute abnormality head or cervical spine. Electronically Signed   By: Inge Rise M.D.   On: 02/18/2019 14:56   Ct Cervical Spine Wo Contrast  Result Date: 02/18/2019 CLINICAL DATA:  Recurrent dizzy spells and falling. Initial encounter. EXAM: CT HEAD WITHOUT CONTRAST CT CERVICAL SPINE WITHOUT CONTRAST TECHNIQUE: Multidetector CT imaging of the head and cervical spine was performed following the standard protocol without intravenous contrast. Multiplanar CT image reconstructions of the cervical spine were also generated. COMPARISON:  Plain film cervical spine 03/09/2013. FINDINGS: CT HEAD FINDINGS Brain: No evidence of acute infarction, hemorrhage, hydrocephalus, extra-axial collection or mass lesion/mass effect. Vascular: No hyperdense vessel or unexpected calcification. Skull: Intact.  No focal lesion. Sinuses/Orbits: Negative. Other: None. CT CERVICAL SPINE FINDINGS Alignment: Maintained. Skull base and vertebrae: No acute fracture. No primary bone lesion or focal pathologic process. The patient is status post solid C5-6 ACDF. Soft tissues and spinal canal: No prevertebral fluid or swelling. No visible canal hematoma. Disc levels:  Shallow disc protrusions are seen at C4-5 and C6-7. Upper chest: Negative. Other: None. IMPRESSION: No acute abnormality head or cervical spine. Electronically Signed   By: Inge Rise M.D.   On: 02/18/2019 14:56   Mr  Brain Wo Contrast  Result Date: 02/18/2019 CLINICAL DATA:  63 year old female with dizziness, constant headache for 2 days. Loss of balance, with fall this morning. EXAM: MRI HEAD WITHOUT CONTRAST TECHNIQUE: Multiplanar, multiecho pulse sequences of the brain and surrounding structures were obtained without intravenous contrast. COMPARISON:  Head and cervical spine CT earlier Today.Brain MRI and intracranial MRA 12/29/2017. FINDINGS: Brain: Cerebral volume remains normal for age. No restricted diffusion to suggest acute infarction. No midline shift, mass effect, evidence of mass lesion, ventriculomegaly, extra-axial collection or acute intracranial hemorrhage. Cervicomedullary junction and pituitary are within normal limits. 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 Mild for age other scattered nonspecific cerebral white matter T2 and FLAIR hyperintensity. The remaining deep gray nuclei, brainstem and cerebellum are normal for age. No cortical encephalomalacia or chronic cerebral blood products identified. Vascular: Major intracranial vascular flow voids are stable. Skull and upper cervical spine: Negative visible cervical spine. Normal bone marrow signal. Sinuses/Orbits: Negative orbits. Paranasal sinuses and mastoids are stable and well pneumatized. Other: Visible internal auditory structures appear normal. Normal stylomastoid foramina. Scalp and face soft tissues are within normal limits.  IMPRESSION: 1. No acute intracranial abnormality. 2. Mild for age chronic small vessel disease appears stable since 2019. Electronically Signed   By: Genevie Ann M.D.   On: 02/18/2019 20:39   US Carotid Bilateral  Result Date: 02/19/2019 CLINICAL DATA:  Dizziness.  Hypertension,. EXAM: BILATERAL CAROTID DUPLEX ULTRASOUND TECHNIQUE: Pearline Cables scale imaging, color Doppler and duplex ultrasound were performed of bilateral carotid and vertebral arteries in the neck. COMPARISON:  None. FINDINGS: Criteria:  Quantification of carotid stenosis is based on velocity parameters that correlate the residual internal carotid diameter with NASCET-based stenosis levels, using the diameter of the distal internal carotid lumen as the denominator for stenosis measurement. The following velocity measurements were obtained: RIGHT ICA: 99/36 cm/sec CCA: 43/56 cm/sec SYSTOLIC ICA/CCA RATIO:  1.5 ECA: 92 cm/sec LEFT ICA: 123/48 cm/sec CCA: 86/16 cm/sec SYSTOLIC ICA/CCA RATIO:  1.9 ECA: 54 cm/sec RIGHT CAROTID ARTERY: Mild eccentric noncalcified plaque in the bulb. No high-grade stenosis. Normal waveforms and color Doppler signal. ICA mildly tortuous. RIGHT VERTEBRAL ARTERY:  Normal flow direction and waveform. LEFT CAROTID ARTERY: Mild intimal thickening in the proximal common carotid artery. Eccentric noncalcified plaque in the carotid bulb. No high-grade stenosis. Normal waveforms and color Doppler signal. Tortuous ICA. LEFT VERTEBRAL ARTERY:  Normal flow direction and waveform. IMPRESSION: 1. 1. 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 2. Antegrade bilateral vertebral arterial flow. Electronically Signed   By: Lucrezia Europe M.D.   On: 02/19/2019 10:44   Dg Hip Unilat With Pelvis 2-3 Views Right  Result Date: 02/19/2019 CLINICAL DATA:  Right hip pain for 1 week.  No specific injury. EXAM: DG HIP (WITH OR WITHOUT PELVIS) 2-3V RIGHT COMPARISON:  None. FINDINGS: There is no evidence of hip fracture or dislocation. There is no evidence of arthropathy or other focal bone abnormality. IMPRESSION: No acute osseous injury of the right hip. Electronically Signed   By: Kathreen Devoid   On: 02/19/2019 15:26        Scheduled Meds:  aspirin EC  81 mg Oral Daily   atorvastatin  20 mg Oral Daily   azelastine  1 spray Each Nare BID   darifenacin  7.5 mg Oral Daily   DULoxetine  30 mg Oral Daily   fluticasone  2 spray Each Nare Daily   heparin  5,000 Units Subcutaneous Q8H    irbesartan  150 mg Oral Daily   loratadine  10 mg Oral Daily   mometasone-formoterol  2 puff Inhalation BID   montelukast  10 mg Oral Daily   traZODone  100 mg Oral QHS   venlafaxine XR  37.5 mg Oral Daily   Continuous Infusions:   LOS: 0 days    Time spent: 30-35 minutes    Ezekiel Slocumb, DO [facility] Pager: [phone]  If 7PM-7AM, please contact night-coverage [email or web address] Password [email or web address] 02/19/2019, 3:35 PM

## 2019-02-19 NOTE — ED Notes (Signed)
Hand off report called to Zara, RN on 1C.

## 2019-02-19 NOTE — Progress Notes (Signed)
PT Cancellation Note  Patient Details Name: Rachael Jordan MRN: [id number] DOB: Feb 24, 1956   Cancelled Treatment:    Reason Eval/Treat Not Completed: Other (comment). Consult received. Discussed with attending MD. She request to hold PT this date as neuro is pending work up. Will re-assess tomorrow.   Jayde Daffin 02/19/2019, 4:05 PM Greggory Stallion, PT, DPT [phone]

## 2019-02-19 NOTE — ED Notes (Signed)
Pt resting in bed without complaints. Pt alert and oriented. No acute distress noted.

## 2019-02-20 DIAGNOSIS — J302 Other seasonal allergic rhinitis: Secondary | ICD-10-CM

## 2019-02-20 DIAGNOSIS — G253 Myoclonus: Secondary | ICD-10-CM

## 2019-02-20 DIAGNOSIS — J449 Chronic obstructive pulmonary disease, unspecified: Secondary | ICD-10-CM | POA: Diagnosis present

## 2019-02-20 DIAGNOSIS — F418 Other specified anxiety disorders: Secondary | ICD-10-CM | POA: Diagnosis present

## 2019-02-20 DIAGNOSIS — R296 Repeated falls: Secondary | ICD-10-CM

## 2019-02-20 DIAGNOSIS — G4700 Insomnia, unspecified: Secondary | ICD-10-CM | POA: Diagnosis present

## 2019-02-20 DIAGNOSIS — N179 Acute kidney failure, unspecified: Secondary | ICD-10-CM | POA: Diagnosis present

## 2019-02-20 LAB — BASIC METABOLIC PANEL
Anion gap: 10 (ref 5–15)
BUN: 9 mg/dL (ref 8–23)
CO2: 26 mmol/L (ref 22–32)
Calcium: 9 mg/dL (ref 8.9–10.3)
Chloride: 106 mmol/L (ref 98–111)
Creatinine, Ser: 0.93 mg/dL (ref 0.44–1.00)
GFR calc Af Amer: >60 mL/min (ref >60)
GFR calc non Af Amer: >60 mL/min (ref >60)
Glucose, Bld: 90 mg/dL (ref 70–99)
Potassium: 3.7 mmol/L (ref 3.5–5.1)
Sodium: 142 mmol/L (ref 135–145)

## 2019-02-20 LAB — MAGNESIUM: Magnesium: 2 mg/dL (ref 1.7–2.4)

## 2019-02-20 LAB — VITAMIN B12: Vitamin B-12: 388 pg/mL (ref 180–914)

## 2019-02-20 MED ORDER — FLUTICASONE PROPIONATE 50 MCG/ACT NA SUSP: 2.0000 | Freq: Every day | NASAL | 2 refills | Status: DC

## 2019-02-20 NOTE — Progress Notes (Signed)
OT Cancellation Note  Patient Details Name: Rachael Jordan MRN: [id number] DOB: [birth year]   Cancelled Treatment:    Reason Eval/Treat Not Completed: OT screened, no needs identified, will sign off  OT consult received and chart reviewed. Upon reviewing PT note, speaking with RN and speaking with pt, pt has good supports in place at home and is requiring SBA to CGA with fxl mobility at this time. Plan for HHPT in place.  No acute OT needs identified at this time. Will complete order at this time. Thank you.   Gerrianne Scale, [place], OTR/L ascom [phone] 02/20/19, 12:51 PM

## 2019-02-20 NOTE — Progress Notes (Signed)
Physical Therapy Evaluation Patient Details Name: Rachael Jordan MRN: [id number] DOB: 12-09-1955 Today's Date: 02/20/2019   History of Present Illness  Rachael Jordan is a 63 y.o. female with medical history significant of HTN, chronic pain, CKD stage III.  She presented to the ED by EMS from her PCP's office due to persistent dizziness and headache for past several days.  Reports history of migraines, but says this headache quite different.  She denies history of vertigo, but her mom has it.  Patient reports room spinning at times, but mostly lightheadedness.  Denies vision changes, trouble speaking, trouble swallowing, focal weakness, numbness/tingling.  Does report all extremities feeling weak and having spasms / jerking intermittently, and weak grip causing her to drop objects.  Says her extremities "just feel funny", but cannot describe further.  She reports multiple falls related to dizziness over past, which prompted her to call her PCP.  Denies loss of consciousness, chest pain, palpitations, SOB, diaphoresis.  Per patient, her BP at PCP's office was low, systolic 64'W.  Reports a history of labile blood pressures, and similar symptoms when her BP gets low, but not nearly this severe or persistent. MRI of brain and CT of cervical spine were both negative for acute changes. Carotid dopplers showed less than 50% stenosis. Pt currently admitted for multiple falls secondary to dizziness and myoclonic jerking/tremors of unclear etiology. Neurology is also currently following patient.   Clinical Impression  Pt admitted with above diagnosis. Pt currently with functional limitations due to the deficits listed below (see PT Problem List). Pt is independent with bed mobility. During transfers she moves slowly but doesn't require assist coming to standing. Stable in standing without UE support. She is able to ambulate from bed to doorway with CGA from therapist and chair follow by CNA. RW utilized for  ambulation. Pt tremulous intermittently while walking and when she gets to the door way she reports feeling like her legs are weak and she needs to sit down. Pt rolled back into room with recliner and then assisted in transfer back to bed. No focal deficits appreciated in strength or sensation. EOM intact and no spontaneous or gaze-evoked nystagmus. She denies any true dizziness or vertigo and simply reports that her legs start to shake and she feels like "everything goes dark." She become tremulous spontaneously throughout session with jerky movements. Canal testing and further vestibular testing deferred due to history and exam which are not in support of vestibular etiology. If concerns for vestibular involvement persist pt can follow up with ENT on an outpatient basis. She is safe to discharge home with 24/7 support from family and assist whenever ambulating with gait belt and rolling walker. She refuses offers for equipment and reports she has access to any equipment she might need. No evidence that SNF placement would be of any therapeutic benefit for patient at this time. Pt will benefit from PT services to address deficits in balance and mobility in order to return to full function at home.     Follow Up Recommendations No PT follow up;Supervision for mobility/OOB;Supervision/Assistance - 24 hour(CGA with gait belt by family when ambulating)    Equipment Recommendations  Other (comment)(Pt denies and reports access to all equipment)    Recommendations for Other Services       Precautions / Restrictions Precautions Precautions: Fall Restrictions Weight Bearing Restrictions: No      Mobility  Bed Mobility Overal bed mobility: Independent  General bed mobility comments: good speed/sequencing  Transfers Overall transfer level: Needs assistance Equipment used: Rolling walker (2 wheeled) Transfers: Sit to/from Stand Sit to Stand: Min guard         General transfer  comment: Pt moves slowly but doesn't require assist coming to standing. Stable in standing without UE support  Ambulation/Gait Ambulation/Gait assistance: Min guard Gait Distance (Feet): 10 Feet Assistive device: Rolling walker (2 wheeled) Gait Pattern/deviations: Decreased step length - right;Decreased step length - left     General Gait Details: Pt able to ambulate from bed to doorway with CGA from therapist and chair follow by CNA. RW utilized for ambulation. Pt tremulous intermittently while walking and when she gets to the door way she reports feeling like her legs are weak and she needs to sit down. Pt rolled back into room with recliner and then assisted in transfer back to bed  Stairs            Wheelchair Mobility    Modified Rankin (Stroke Patients Only)       Balance Overall balance assessment: Needs assistance Sitting-balance support: No upper extremity supported Sitting balance-Leahy Scale: Good     Standing balance support: No upper extremity supported Standing balance-Leahy Scale: Fair                               Pertinent Vitals/Pain Pain Assessment: 0-10 Pain Score: (Pt won't rate, reports "very mild") Pain Location: headache Pain Intervention(s): Monitored during session    Short expects to be discharged to:: Private residence Living Arrangements: Other relatives(Sister, nephew, and niece.) Available Help at Discharge: Family;Available 24 hours/day Type of Home: House Home Access: Stairs to enter Entrance Stairs-Rails: None Entrance Stairs-Number of Steps: 1 Home Layout: Two level;1/2 bath on main level;Bed/bath upstairs Home Equipment: Walker - 4 wheels;Cane - single point;Bedside commode;Shower seat(Pt reports she has access to a manual wheelchair)      Prior Function Level of Independence: Independent         Comments: 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 Independent with ADLs/IADLs.  Drives. She reports 9 falls in the days leading up to admission. Prior to that she reports approximately 4-5 falls in the last year. She denies LOB but states that "things just go black" in her vision. Denies LOC     Hand Dominance   Dominant Hand: Right    Extremity/Trunk Assessment   Upper Extremity Assessment Upper Extremity Assessment: Overall WFL for tasks assessed    Lower Extremity Assessment Lower Extremity Assessment: Overall WFL for tasks assessed       Communication   Communication: No difficulties  Cognition Arousal/Alertness: Awake/alert Behavior During Therapy: Anxious Overall Cognitive Status: Within Functional Limits for tasks assessed                                        General Comments      Exercises     Assessment/Plan    PT Assessment Patient needs continued PT services  PT Problem List Decreased balance;Decreased activity tolerance;Decreased mobility       PT Treatment Interventions DME instruction;Gait training;Stair training;Functional mobility training;Therapeutic activities;Therapeutic exercise;Balance training;Neuromuscular re-education;Patient/family education    PT Goals (Current goals can be found in the Care Plan section)  Acute Rehab PT Goals Patient Stated Goal: Return to prior level of function  PT Goal Formulation: With patient Time For Goal Achievement: 03/06/19 Potential to Achieve Goals: Good    Frequency Min 2X/week   Barriers to discharge        Co-evaluation               AM-PAC PT "6 Clicks" Mobility  Outcome Measure Help needed turning from your back to your side while in a flat bed without using bedrails?: None Help needed moving from lying on your back to sitting on the side of a flat bed without using bedrails?: None Help needed moving to and from a bed to a chair (including a wheelchair)?: A Little Help needed standing up from a chair using your arms (e.g., wheelchair or bedside chair)?:  A Little Help needed to walk in hospital room?: A Little Help needed climbing 3-5 steps with a railing? : A Lot 6 Click Score: 19    End of Session Equipment Utilized During Treatment: Gait belt Activity Tolerance: Patient tolerated treatment well Patient left: in bed;with nursing/sitter in room Nurse Communication: Mobility status PT Visit Diagnosis: Unsteadiness on feet (R26.81);Repeated falls (R29.6)    Time: 6767-2094 PT Time Calculation (min) (ACUTE ONLY): 35 min   Charges:   PT Evaluation $PT Eval Low Complexity: 1 Low PT Treatments $Gait Training: 8-22 mins        Lyndel Safe Huprich PT, DPT, GCS   Huprich,Jason 02/20/2019, 11:47 AM

## 2019-02-20 NOTE — Progress Notes (Signed)
Discharge instructions given. Pt verbalized understanding and all questions were answered. Waiting for family member to take patient home.

## 2019-02-20 NOTE — Progress Notes (Signed)
Subjective: Patient improved today.  Has been able to get to the bathroom with assistance.    Objective: Current vital signs: BP 127/81 (BP Location: Right Arm)   Pulse 96   Temp 98.9 F (37.2 C) (Oral)   Resp 14   Ht 5' 5"  (1.651 m)   Wt 71.7 kg   SpO2 96%   BMI 26.29 kg/m  Vital signs in last 24 hours: Temp:  [98.2 F (36.8 C)-98.9 F (37.2 C)] 98.9 F (37.2 C) (12/05 0830) Pulse Rate:  [78-98] 96 (12/05 0830) Resp:  [14-20] 14 (12/05 0830) BP: (117-160)/(72-94) 127/81 (12/05 0830) SpO2:  [96 %-99 %] 96 % (12/05 0830)  Intake/Output from previous day: 12/04 0701 - 12/05 0700 In: 240 [P.O.:240] Out: -  Intake/Output this shift: No intake/output data recorded. Nutritional status:  Diet Order            Diet Heart Room service appropriate? Yes; Fluid consistency: Thin  Diet effective now              Neurologic Exam: Mental Status: Alert, oriented, thought content appropriate.  Speech fluent without evidence of aphasia.  Able to follow 3 step commands without difficulty. Cranial Nerves: II: Discs flat bilaterally; Visual fields grossly normal, pupils equal, round, reactive to light and accommodation III,IV, VI: ptosis not present, extra-ocular motions intact bilaterally V,VII: smile symmetric, facial light touch sensation normal bilaterally VIII: hearing normal bilaterally IX,X: gag reflex present XI: bilateral shoulder shrug XII: midline tongue extension Motor: Able to lift all extremities against gravity with no focal weakness appreciated.  Minimal tremor.   Sensory: Pinprick and light touch decreased in the RLE   Lab Results: Basic Metabolic Panel: Recent Labs  Lab 02/18/19 1406 02/19/19 0516  NA 139 141  K 4.0 3.5  CL 103 105  CO2 28 29  GLUCOSE 100* 92  BUN 13 10  CREATININE 1.54* 1.11*  CALCIUM 9.2 8.6*    Liver Function Tests: Recent Labs  Lab 02/18/19 1406  AST 17  ALT 11  ALKPHOS 61  BILITOT 0.7  PROT 7.6  ALBUMIN 3.9   No  results for input(s): LIPASE, AMYLASE in the last 168 hours. No results for input(s): AMMONIA in the last 168 hours.  CBC: Recent Labs  Lab 02/18/19 1406 02/19/19 0516  WBC 9.1 7.6  NEUTROABS 6.2  --   HGB 10.0* 10.3*  HCT 31.6* 30.2*  MCV 92.1 87.8  PLT 351 338    Cardiac Enzymes: No results for input(s): CKTOTAL, CKMB, CKMBINDEX, TROPONINI in the last 168 hours.  Lipid Panel: Recent Labs  Lab 02/19/19 0516  CHOL 133  TRIG 71  HDL 52  CHOLHDL 2.6  VLDL 14  LDLCALC 67    CBG: Recent Labs  Lab 02/18/19 1405  GLUCAP 99    Microbiology: Results for orders placed or performed during the hospital encounter of 02/18/19  SARS CORONAVIRUS 2 (TAT 6-24 HRS) Nasopharyngeal Nasopharyngeal Swab     Status: None   Collection Time: 02/18/19  4:15 PM   Specimen: Nasopharyngeal Swab  Result Value Ref Range Status   SARS Coronavirus 2 NEGATIVE NEGATIVE Final    Comment: (NOTE) SARS-CoV-2 target nucleic acids are NOT DETECTED. The SARS-CoV-2 RNA is generally detectable in upper and lower respiratory specimens during the acute phase of infection. Negative results do not preclude SARS-CoV-2 infection, do not rule out co-infections with other pathogens, and should not be used as the sole basis for treatment or other patient management decisions. Negative results  must be combined with clinical observations, patient history, and epidemiological information. The expected result is Negative. Fact Sheet for Patients: SugarRoll.be Fact Sheet for Healthcare Providers: https://www.woods-mathews.com/ This test is not yet approved or cleared by the [place] FDA and  has been authorized for detection and/or diagnosis of SARS-CoV-2 by FDA under an Emergency Use Authorization (EUA). This EUA will remain  in effect (meaning this test can be used) for the duration of the COVID-19 declaration under Section 56 4(b)(1) of the Act, 21  U.S.C. section 360bbb-3(b)(1), unless the authorization is terminated or revoked sooner. Performed at [facility], [place] [address], [place], [place] [address]     Coagulation Studies: No results for input(s): LABPROT, INR in the last 72 hours.  Imaging: Ct Head Wo Contrast  Result Date: 02/18/2019 CLINICAL DATA:  Recurrent dizzy spells and falling. Initial encounter. EXAM: CT HEAD WITHOUT CONTRAST CT CERVICAL SPINE WITHOUT CONTRAST TECHNIQUE: Multidetector CT imaging of the head and cervical spine was performed following the standard protocol without intravenous contrast. Multiplanar CT image reconstructions of the cervical spine were also generated. COMPARISON:  Plain film cervical spine 03/09/2013. FINDINGS: CT HEAD FINDINGS Brain: No evidence of acute infarction, hemorrhage, hydrocephalus, extra-axial collection or mass lesion/mass effect. Vascular: No hyperdense vessel or unexpected calcification. Skull: Intact.  No focal lesion. Sinuses/Orbits: Negative. Other: None. CT CERVICAL SPINE FINDINGS Alignment: Maintained. Skull base and vertebrae: No acute fracture. No primary bone lesion or focal pathologic process. The patient is status post solid C5-6 ACDF. Soft tissues and spinal canal: No prevertebral fluid or swelling. No visible canal hematoma. Disc levels:  Shallow disc protrusions are seen at C4-5 and C6-7. Upper chest: Negative. Other: None. IMPRESSION: No acute abnormality head or cervical spine. Electronically Signed   By: Inge Rise M.D.   On: 02/18/2019 14:56   Ct Cervical Spine Wo Contrast  Result Date: 02/18/2019 CLINICAL DATA:  Recurrent dizzy spells and falling. Initial encounter. EXAM: CT HEAD WITHOUT CONTRAST CT CERVICAL SPINE WITHOUT CONTRAST TECHNIQUE: Multidetector CT imaging of the head and cervical spine was performed following the standard protocol without intravenous contrast. Multiplanar CT image reconstructions of the cervical spine were also generated.  COMPARISON:  Plain film cervical spine 03/09/2013. FINDINGS: CT HEAD FINDINGS Brain: No evidence of acute infarction, hemorrhage, hydrocephalus, extra-axial collection or mass lesion/mass effect. Vascular: No hyperdense vessel or unexpected calcification. Skull: Intact.  No focal lesion. Sinuses/Orbits: Negative. Other: None. CT CERVICAL SPINE FINDINGS Alignment: Maintained. Skull base and vertebrae: No acute fracture. No primary bone lesion or focal pathologic process. The patient is status post solid C5-6 ACDF. Soft tissues and spinal canal: No prevertebral fluid or swelling. No visible canal hematoma. Disc levels:  Shallow disc protrusions are seen at C4-5 and C6-7. Upper chest: Negative. Other: None. IMPRESSION: No acute abnormality head or cervical spine. Electronically Signed   By: Inge Rise M.D.   On: 02/18/2019 14:56   Mr Brain Wo Contrast  Result Date: 02/18/2019 CLINICAL DATA:  63 year old female with dizziness, constant headache for 2 days. Loss of balance, with fall this morning. EXAM: MRI HEAD WITHOUT CONTRAST TECHNIQUE: Multiplanar, multiecho pulse sequences of the brain and surrounding structures were obtained without intravenous contrast. COMPARISON:  Head and cervical spine CT earlier Today.Brain MRI and intracranial MRA 12/29/2017. FINDINGS: Brain: Cerebral volume remains normal for age. No restricted diffusion to suggest acute infarction. No midline shift, mass effect, evidence of mass lesion, ventriculomegaly, extra-axial collection or acute intracranial hemorrhage. Cervicomedullary junction and pituitary are within  normal limits. 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 Mild for age other scattered nonspecific cerebral white matter T2 and FLAIR hyperintensity. The remaining deep gray nuclei, brainstem and cerebellum are normal for age. No cortical encephalomalacia or chronic cerebral blood products identified. Vascular: Major intracranial vascular flow voids  are stable. Skull and upper cervical spine: Negative visible cervical spine. Normal bone marrow signal. Sinuses/Orbits: Negative orbits. Paranasal sinuses and mastoids are stable and well pneumatized. Other: Visible internal auditory structures appear normal. Normal stylomastoid foramina. Scalp and face soft tissues are within normal limits. IMPRESSION: 1. No acute intracranial abnormality. 2. Mild for age chronic small vessel disease appears stable since 2019. Electronically Signed   By: Genevie Ann M.D.   On: 02/18/2019 20:39   US Carotid Bilateral  Result Date: 02/19/2019 CLINICAL DATA:  Dizziness.  Hypertension,. EXAM: BILATERAL CAROTID DUPLEX ULTRASOUND TECHNIQUE: Pearline Cables scale imaging, color Doppler and duplex ultrasound were performed of bilateral carotid and vertebral arteries in the neck. COMPARISON:  None. FINDINGS: Criteria: Quantification of carotid stenosis is based on velocity parameters that correlate the residual internal carotid diameter with NASCET-based stenosis levels, using the diameter of the distal internal carotid lumen as the denominator for stenosis measurement. The following velocity measurements were obtained: RIGHT ICA: 99/36 cm/sec CCA: 93/90 cm/sec SYSTOLIC ICA/CCA RATIO:  1.5 ECA: 92 cm/sec LEFT ICA: 123/48 cm/sec CCA: 30/09 cm/sec SYSTOLIC ICA/CCA RATIO:  1.9 ECA: 54 cm/sec RIGHT CAROTID ARTERY: Mild eccentric noncalcified plaque in the bulb. No high-grade stenosis. Normal waveforms and color Doppler signal. ICA mildly tortuous. RIGHT VERTEBRAL ARTERY:  Normal flow direction and waveform. LEFT CAROTID ARTERY: Mild intimal thickening in the proximal common carotid artery. Eccentric noncalcified plaque in the carotid bulb. No high-grade stenosis. Normal waveforms and color Doppler signal. Tortuous ICA. LEFT VERTEBRAL ARTERY:  Normal flow direction and waveform. IMPRESSION: 1. 1. 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 2. Antegrade bilateral  vertebral arterial flow. Electronically Signed   By: Lucrezia Europe M.D.   On: 02/19/2019 10:44   Dg Hip Unilat With Pelvis 2-3 Views Right  Result Date: 02/19/2019 CLINICAL DATA:  Right hip pain for 1 week.  No specific injury. EXAM: DG HIP (WITH OR WITHOUT PELVIS) 2-3V RIGHT COMPARISON:  None. FINDINGS: There is no evidence of hip fracture or dislocation. There is no evidence of arthropathy or other focal bone abnormality. IMPRESSION: No acute osseous injury of the right hip. Electronically Signed   By: Kathreen Devoid   On: 02/19/2019 15:26    Medications:  I have reviewed the patient's current medications. Scheduled: . aspirin EC  81 mg Oral Daily  . atorvastatin  20 mg Oral Daily  . azelastine  1 spray Each Nare BID  . darifenacin  7.5 mg Oral Daily  . DULoxetine  30 mg Oral Daily  . fluticasone  2 spray Each Nare Daily  . heparin  5,000 Units Subcutaneous Q8H  . irbesartan  150 mg Oral Daily  . loratadine  10 mg Oral Daily  . mometasone-formoterol  2 puff Inhalation BID  . montelukast  10 mg Oral Daily  . traZODone  100 mg Oral QHS  . venlafaxine XR  37.5 mg Oral Daily    Assessment/Plan: 63 year old female with a history of chronic pain presenting with neck, back and hip pain along with tremors affecting ADL's.  Neurological examination did exhibit some supratentorial features.  MRI of the brain reviewed and shows no acute  changes.  CT of her cervical spine shows her C5-6 ACDF but no acute changes or cord abnormalities.  Plain films of the hip unremarkable. TSH normal.  Patient does have some mild renal insufficiency that was not present earlier in the year and is using her Neurontin more and this may have been causing some abnormal involuntary movements.  Patient improved today.  Recommendations: 1. PT/OT 2. To remain off Neurontin    LOS: 1 day   Alexis Goodell, MD Neurology [phone] 02/20/2019  9:44 AM

## 2019-02-20 NOTE — Discharge Summary (Addendum)
Physician Discharge Summary  Rachael Jordan YQM:[id number] DOB: 03-20-1955 DOA: 02/18/2019  PCP: Sharyne Peach, MD  Admit date: 02/18/2019 Discharge date: 02/20/2019  Admitted From: Home Disposition: Home  Recommendations for Outpatient Follow-up:  1. Follow up with PCP in 1-2 weeks 2. Please obtain BMP/CBC in one week  Home Health: No Equipment/Devices: None  Discharge Condition: Stable CODE STATUS: Full Diet recommendation: Heart healthy  Brief/Interim Summary:  63 y.o.femalewith medical history significant ofHTN, chronic pain, CKD stage III. She presented to the ED by EMS from her PCP's office due to persistent dizziness and headache for past several days in addition to all extremities feeling weak and having spasms / jerking intermittently, and weak grip causing her to drop objects. Multiple falls related to dizziness over past 24 hours as well, which prompted her to call her PCP. Concern for posterior stroke, admitted for further evaluation and management.  Neurology consulted.   MRI brain, CT head, and CT cervical spine negative other than C5-6 fusion.  X-ray of hip obtained due to persistent pain, it was negative.  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  Patient's gabapentin was discontinued due to renal insufficiency and onset of tremors and myoclonic jerking in the setting of increased use of gabapentin recently.  Patient symptoms did improve off of gabapentin.  She was evaluated by physical therapy who recommended supervision and/or for mobility, but no PT follow-up needed.  Patient was clinically improved and hemodynamically stable for discharge home today, to remain off gabapentin for now.  Discharge Diagnoses: Principal Problem:   Dizziness Active Problems:   Migraine   Chronic pain syndrome   Essential hypertension   Recurrent falls   Myoclonic jerking   COPD (chronic obstructive pulmonary disease) (HCC)   Seasonal allergies   Insomnia   Depression  with anxiety   AKI (acute kidney injury) ([place])  Dizziness Multiple Falls secondary to dizziness Tremors vs Myoclonic Jerkings, possibly due to medication side effect and renal insufficiency Etiology is to be determined, differential includes posterior stroke, vertigo, atypical migraine, orthostatic hypotension / labile BP less likely as patient symptomatic in ED with normal BP. CT head negative. No focal deficits on exam other than subjective sensory deficit at left shoulder to hand.  - MRI brain w/o contrast - negative - Carotid dopplers - less than 50% stenosis b/l - Neurology following - continue ASA, statin - PT/OT eval  Acute kidney injury, present on admission -resolved ?  History of CKD which had since resolved, creatinine normal in August 2020 0.93 -Stop gabapentin -Monitor renal function -Follow-up with nephrology outpatient  Headache History of Migraine Headaches Current headache x several days, persistent, unlike usual migraines per patient. Work up as above. 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 - continue home oxycodone PRN - stop gabapentin and tramadol - continue home Zanaflex - continue home Cymbalta  COPD- not acutely exacerbated - cont Albuterol HFA, Duonebs PRN - sub Dulera for home Symbicort  Seasonal Allergies  - sub loratadine for home Zyrtec - start Flonase  Insomnia - continue home Trazadone  Depression/Anxiety - continue home Effexor, Cymbalta  Discharge Instructions   Discharge Instructions    Call MD for:  severe uncontrolled pain   Complete by: As directed    Call MD for:  temperature >100.4   Complete by: As directed    Diet - low sodium heart  healthy   Complete by: As directed    Discharge instructions   Complete by: As directed    Please stay off gabapentin for now, we think  due to reduced kidney function, gabapentin was building up in your system and contributing so some of your symptoms (limb jerking / tremors).    See PCP or kidney doctor in 1 week to recheck labs.  Be careful with ambulating.  If unsteady on your feet, sit down immediately.  Have assistance from others with walking if you feel unsteady.   Increase activity slowly   Complete by: As directed      Allergies as of 02/20/2019      Reactions   Grapefruit Bioflavonoid Complex Rash   Grapefruit Extract Rash   Propoxyphene Nausea And Vomiting, Nausea Only   Prednisolone    Psychotic features      Medication List    STOP taking these medications   cefdinir 300 MG capsule Commonly known as: OMNICEF   gabapentin 800 MG tablet Commonly known as: NEURONTIN   nabumetone 750 MG tablet Commonly known as: RELAFEN     TAKE these medications   albuterol (2.5 MG/3ML) 0.083% nebulizer solution Commonly known as: PROVENTIL Take 2.5 mg by nebulization every 6 (six) hours as needed for wheezing or shortness of breath.   aspirin EC 81 MG tablet Take 81 mg by mouth daily.   atorvastatin 20 MG tablet Commonly known as: LIPITOR Take 20 mg by mouth every evening.   azelastine 0.1 % nasal spray Commonly known as: ASTELIN Place 1-2 sprays into both nostrils 2 (two) times daily.   Biotin 10000 MCG Tabs Take 10 mg by mouth daily.   budesonide-formoterol 80-4.5 MCG/ACT inhaler Commonly known as: SYMBICORT Inhale 2 puffs into the lungs 2 (two) times daily.   Calcium Carbonate-Vitamin D3 600-400 MG-UNIT Tabs Take 1 tablet by mouth daily.   CENTRUM ADULTS PO Take 1 tablet by mouth daily.   cetirizine 10 MG tablet Commonly known as: ZYRTEC Take 10 mg by mouth at bedtime.   docusate sodium 100 MG capsule Commonly known as: COLACE Take 100 mg by mouth daily as needed for mild constipation.   DULoxetine 30 MG capsule Commonly known as: CYMBALTA Take 30 mg by mouth daily.   fluticasone 50  MCG/ACT nasal spray Commonly known as: FLONASE Place 2 sprays into both nostrils daily. Start taking on: February 21, 2019   hydrocortisone 2.5 % ointment Apply 1 application topically 2 (two) times daily as needed (skin dryness/itching).   ipratropium-albuterol 0.5-2.5 (3) MG/3ML Soln Commonly known as: DUONEB Take 3 mLs by nebulization every 4 (four) hours as needed.   montelukast 10 MG tablet Commonly known as: SINGULAIR Take 10 mg by mouth at bedtime.   olmesartan 20 MG tablet Commonly known as: BENICAR Take 10 mg by mouth daily.   oxyCODONE 15 MG immediate release tablet Commonly known as: ROXICODONE Take 15 mg by mouth 4 (four) times daily as needed (severe pain).   oxyCODONE-acetaminophen 5-325 MG tablet Commonly known as: PERCOCET/ROXICET Take 1-2 tablets by mouth every 4 (four) hours as needed for moderate pain or severe pain.   promethazine 25 MG tablet Commonly known as: PHENERGAN Take 1 tablet (25 mg total) by mouth every 6 (six) hours as needed for nausea or vomiting.   rizatriptan 10 MG tablet Commonly known as: MAXALT Take 10 mg by mouth as needed for migraine. May repeat in 2 hours if needed   tiZANidine 4 MG tablet Commonly known  as: ZANAFLEX Take 4 mg by mouth 4 (four) times daily as needed for muscle spasms.   traZODone 100 MG tablet Commonly known as: DESYREL Take 200 mg by mouth at bedtime.   trospium 20 MG tablet Commonly known as: SANCTURA Take 20 mg by mouth 2 (two) times daily.   valACYclovir 1000 MG tablet Commonly known as: VALTREX Take 1,000 mg by mouth daily.   venlafaxine XR 37.5 MG 24 hr capsule Commonly known as: EFFEXOR-XR Take 37.5 mg by mouth daily.   vitamin C 500 MG tablet Commonly known as: ASCORBIC ACID Take 500 mg by mouth daily.   Vitamin D-1000 Max St 25 MCG (1000 UT) tablet Generic drug: Cholecalciferol Take 1,000 Units by mouth daily.      Follow-up Information    Sharyne Peach, MD. Schedule an  appointment as soon as possible for a visit in 1 week(s).   Specialty: Family Medicine Contact information: [place] [address] [phone]        Please follow up.   Why: Patient to make own f/u appt due to office being closed at this time         Allergies  Allergen Reactions  . Grapefruit Bioflavonoid Complex Rash  . Grapefruit Extract Rash  . Propoxyphene Nausea And Vomiting and Nausea Only  . Prednisolone     Psychotic features    Consultations:  Neurology   Procedures/Studies: Ct Head Wo Contrast  Result Date: 02/18/2019 CLINICAL DATA:  Recurrent dizzy spells and falling. Initial encounter. EXAM: CT HEAD WITHOUT CONTRAST CT CERVICAL SPINE WITHOUT CONTRAST TECHNIQUE: Multidetector CT imaging of the head and cervical spine was performed following the standard protocol without intravenous contrast. Multiplanar CT image reconstructions of the cervical spine were also generated. COMPARISON:  Plain film cervical spine 03/09/2013. FINDINGS: CT HEAD FINDINGS Brain: No evidence of acute infarction, hemorrhage, hydrocephalus, extra-axial collection or mass lesion/mass effect. Vascular: No hyperdense vessel or unexpected calcification. Skull: Intact.  No focal lesion. Sinuses/Orbits: Negative. Other: None. CT CERVICAL SPINE FINDINGS Alignment: Maintained. Skull base and vertebrae: No acute fracture. No primary bone lesion or focal pathologic process. The patient is status post solid C5-6 ACDF. Soft tissues and spinal canal: No prevertebral fluid or swelling. No visible canal hematoma. Disc levels:  Shallow disc protrusions are seen at C4-5 and C6-7. Upper chest: Negative. Other: None. IMPRESSION: No acute abnormality head or cervical spine. Electronically Signed   By: Inge Rise M.D.   On: 02/18/2019 14:56   Ct Cervical Spine Wo Contrast  Result Date: 02/18/2019 CLINICAL DATA:  Recurrent dizzy spells and falling. Initial encounter. EXAM: CT HEAD WITHOUT  CONTRAST CT CERVICAL SPINE WITHOUT CONTRAST TECHNIQUE: Multidetector CT imaging of the head and cervical spine was performed following the standard protocol without intravenous contrast. Multiplanar CT image reconstructions of the cervical spine were also generated. COMPARISON:  Plain film cervical spine 03/09/2013. FINDINGS: CT HEAD FINDINGS Brain: No evidence of acute infarction, hemorrhage, hydrocephalus, extra-axial collection or mass lesion/mass effect. Vascular: No hyperdense vessel or unexpected calcification. Skull: Intact.  No focal lesion. Sinuses/Orbits: Negative. Other: None. CT CERVICAL SPINE FINDINGS Alignment: Maintained. Skull base and vertebrae: No acute fracture. No primary bone lesion or focal pathologic process. The patient is status post solid C5-6 ACDF. Soft tissues and spinal canal: No prevertebral fluid or swelling. No visible canal hematoma. Disc levels:  Shallow disc protrusions are seen at C4-5 and C6-7. Upper chest: Negative. Other: None. IMPRESSION: No acute abnormality head or cervical spine. Electronically  Signed   By: Inge Rise M.D.   On: 02/18/2019 14:56   Mr Brain Wo Contrast  Result Date: 02/18/2019 CLINICAL DATA:  63 year old female with dizziness, constant headache for 2 days. Loss of balance, with fall this morning. EXAM: MRI HEAD WITHOUT CONTRAST TECHNIQUE: Multiplanar, multiecho pulse sequences of the brain and surrounding structures were obtained without intravenous contrast. COMPARISON:  Head and cervical spine CT earlier Today.Brain MRI and intracranial MRA 12/29/2017. FINDINGS: Brain: Cerebral volume remains normal for age. No restricted diffusion to suggest acute infarction. No midline shift, mass effect, evidence of mass lesion, ventriculomegaly, extra-axial collection or acute intracranial hemorrhage. Cervicomedullary junction and pituitary are within normal limits. 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 Mild for age other  scattered nonspecific cerebral white matter T2 and FLAIR hyperintensity. The remaining deep gray nuclei, brainstem and cerebellum are normal for age. No cortical encephalomalacia or chronic cerebral blood products identified. Vascular: Major intracranial vascular flow voids are stable. Skull and upper cervical spine: Negative visible cervical spine. Normal bone marrow signal. Sinuses/Orbits: Negative orbits. Paranasal sinuses and mastoids are stable and well pneumatized. Other: Visible internal auditory structures appear normal. Normal stylomastoid foramina. Scalp and face soft tissues are within normal limits. IMPRESSION: 1. No acute intracranial abnormality. 2. Mild for age chronic small vessel disease appears stable since 2019. Electronically Signed   By: Genevie Ann M.D.   On: 02/18/2019 20:39   US Carotid Bilateral  Result Date: 02/19/2019 CLINICAL DATA:  Dizziness.  Hypertension,. EXAM: BILATERAL CAROTID DUPLEX ULTRASOUND TECHNIQUE: Pearline Cables scale imaging, color Doppler and duplex ultrasound were performed of bilateral carotid and vertebral arteries in the neck. COMPARISON:  None. FINDINGS: Criteria: Quantification of carotid stenosis is based on velocity parameters that correlate the residual internal carotid diameter with NASCET-based stenosis levels, using the diameter of the distal internal carotid lumen as the denominator for stenosis measurement. The following velocity measurements were obtained: RIGHT ICA: 99/36 cm/sec CCA: 26/94 cm/sec SYSTOLIC ICA/CCA RATIO:  1.5 ECA: 92 cm/sec LEFT ICA: 123/48 cm/sec CCA: 85/46 cm/sec SYSTOLIC ICA/CCA RATIO:  1.9 ECA: 54 cm/sec RIGHT CAROTID ARTERY: Mild eccentric noncalcified plaque in the bulb. No high-grade stenosis. Normal waveforms and color Doppler signal. ICA mildly tortuous. RIGHT VERTEBRAL ARTERY:  Normal flow direction and waveform. LEFT CAROTID ARTERY: Mild intimal thickening in the proximal common carotid artery. Eccentric noncalcified plaque in the carotid  bulb. No high-grade stenosis. Normal waveforms and color Doppler signal. Tortuous ICA. LEFT VERTEBRAL ARTERY:  Normal flow direction and waveform. IMPRESSION: 1. 1. 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 2. Antegrade bilateral vertebral arterial flow. Electronically Signed   By: Lucrezia Europe M.D.   On: 02/19/2019 10:44   Dg Hip Unilat With Pelvis 2-3 Views Right  Result Date: 02/19/2019 CLINICAL DATA:  Right hip pain for 1 week.  No specific injury. EXAM: DG HIP (WITH OR WITHOUT PELVIS) 2-3V RIGHT COMPARISON:  None. FINDINGS: There is no evidence of hip fracture or dislocation. There is no evidence of arthropathy or other focal bone abnormality. IMPRESSION: No acute osseous injury of the right hip. Electronically Signed   By: Kathreen Devoid   On: 02/19/2019 15:26     Subjective: Patient seen and examined this morning.  No acute events reported overnight.  Patient walked with PT and felt relatively stable.  States she does have family at home with her most of the time that can assist her when needed.  She denies any  new or worsening symptoms today.  Agrees with plan for discharge.   Discharge Exam: Vitals:   02/20/19 0615 02/20/19 0830  BP: 117/72 127/81  Pulse: 97 96  Resp: 17 14  Temp: 98.5 F (36.9 C) 98.9 F (37.2 C)  SpO2: 99% 96%   Vitals:   02/19/19 1745 02/19/19 1959 02/20/19 0615 02/20/19 0830  BP: (!) 155/88 (!) 154/90 117/72 127/81  Pulse:  94 97 96  Resp:  17 17 14   Temp:  98.2 F (36.8 C) 98.5 F (36.9 C) 98.9 F (37.2 C)  TempSrc:    Oral  SpO2:  99% 99% 96%  Weight:      Height:        General: Pt is alert, awake, not in acute distress Cardiovascular: RRR, S1/S2 +, no rubs, no gallops Respiratory: CTA bilaterally, no wheezing, no rhonchi Abdominal: Soft, NT, ND, bowel sounds + Extremities: no edema, no cyanosis    The results of significant diagnostics from this hospitalization (including imaging, microbiology,  ancillary and laboratory) are listed below for reference.     Microbiology: Recent Results (from the past 240 hour(s))  SARS CORONAVIRUS 2 (TAT 6-24 HRS) Nasopharyngeal Nasopharyngeal Swab     Status: None   Collection Time: 02/18/19  4:15 PM   Specimen: Nasopharyngeal Swab  Result Value Ref Range Status   SARS Coronavirus 2 NEGATIVE NEGATIVE Final    Comment: (NOTE) SARS-CoV-2 target nucleic acids are NOT DETECTED. The SARS-CoV-2 RNA is generally detectable in upper and lower respiratory specimens during the acute phase of infection. Negative results do not preclude SARS-CoV-2 infection, do not rule out co-infections with other pathogens, and should not be used as the sole basis for treatment or other patient management decisions. Negative results must be combined with clinical observations, patient history, and epidemiological information. The expected result is Negative. Fact Sheet for Patients: SugarRoll.be Fact Sheet for Healthcare Providers: https://www.woods-mathews.com/ This test is not yet approved or cleared by the [place] FDA and  has been authorized for detection and/or diagnosis of SARS-CoV-2 by FDA under an Emergency Use Authorization (EUA). This EUA will remain  in effect (meaning this test can be used) for the duration of the COVID-19 declaration under Section 56 4(b)(1) of the Act, 21 U.S.C. section 360bbb-3(b)(1), unless the authorization is terminated or revoked sooner. Performed at [facility], [place] [address], [place], [place] [address]      Labs: BNP (last 3 results) No results for input(s): BNP in the last 8760 hours. Basic Metabolic Panel: Recent Labs  Lab 02/18/19 1406 02/19/19 0516 02/20/19 0741  NA 139 141 142  K 4.0 3.5 3.7  CL 103 105 106  CO2 28 29 26   GLUCOSE 100* 92 90  BUN 13 10 9   CREATININE 1.54* 1.11* 0.93  CALCIUM 9.2 8.6* 9.0  MG  --   --  2.0   Liver Function  Tests: Recent Labs  Lab 02/18/19 1406  AST 17  ALT 11  ALKPHOS 61  BILITOT 0.7  PROT 7.6  ALBUMIN 3.9   No results for input(s): LIPASE, AMYLASE in the last 168 hours. No results for input(s): AMMONIA in the last 168 hours. CBC: Recent Labs  Lab 02/18/19 1406 02/19/19 0516  WBC 9.1 7.6  NEUTROABS 6.2  --   HGB 10.0* 10.3*  HCT 31.6* 30.2*  MCV 92.1 87.8  PLT 351 338   Cardiac Enzymes: No results for input(s): CKTOTAL, CKMB, CKMBINDEX, TROPONINI in the last 168 hours. BNP: Invalid  input(s): POCBNP CBG: Recent Labs  Lab 02/18/19 1405  GLUCAP 99   D-Dimer No results for input(s): DDIMER in the last 72 hours. Hgb A1c Recent Labs    02/19/19 0516  HGBA1C 5.5   Lipid Profile Recent Labs    02/19/19 0516  CHOL 133  HDL 52  LDLCALC 67  TRIG 71  CHOLHDL 2.6   Thyroid function studies Recent Labs    02/19/19 0516  TSH 0.580   Anemia work up No results for input(s): VITAMINB12, FOLATE, FERRITIN, TIBC, IRON, RETICCTPCT in the last 72 hours. Urinalysis    Component Value Date/Time   COLORURINE YELLOW (A) 02/18/2019 1615   APPEARANCEUR CLEAR (A) 02/18/2019 1615   APPEARANCEUR Clear 12/02/2018 1053   LABSPEC 1.004 (L) 02/18/2019 1615   LABSPEC 1.009 06/27/2014 1414   PHURINE 6.0 02/18/2019 1615   GLUCOSEU NEGATIVE 02/18/2019 1615   GLUCOSEU Negative 06/27/2014 1414   HGBUR NEGATIVE 02/18/2019 1615   BILIRUBINUR NEGATIVE 02/18/2019 1615   BILIRUBINUR Negative 12/02/2018 1053   BILIRUBINUR Negative 06/27/2014 1414   KETONESUR NEGATIVE 02/18/2019 1615   PROTEINUR NEGATIVE 02/18/2019 1615   NITRITE NEGATIVE 02/18/2019 1615   LEUKOCYTESUR NEGATIVE 02/18/2019 1615   LEUKOCYTESUR Negative 06/27/2014 1414   Sepsis Labs Invalid input(s): PROCALCITONIN,  WBC,  LACTICIDVEN Microbiology Recent Results (from the past 240 hour(s))  SARS CORONAVIRUS 2 (TAT 6-24 HRS) Nasopharyngeal Nasopharyngeal Swab     Status: None   Collection Time: 02/18/19  4:15 PM    Specimen: Nasopharyngeal Swab  Result Value Ref Range Status   SARS Coronavirus 2 NEGATIVE NEGATIVE Final    Comment: (NOTE) SARS-CoV-2 target nucleic acids are NOT DETECTED. The SARS-CoV-2 RNA is generally detectable in upper and lower respiratory specimens during the acute phase of infection. Negative results do not preclude SARS-CoV-2 infection, do not rule out co-infections with other pathogens, and should not be used as the sole basis for treatment or other patient management decisions. Negative results must be combined with clinical observations, patient history, and epidemiological information. The expected result is Negative. Fact Sheet for Patients: SugarRoll.be Fact Sheet for Healthcare Providers: https://www.woods-mathews.com/ This test is not yet approved or cleared by the [place] FDA and  has been authorized for detection and/or diagnosis of SARS-CoV-2 by FDA under an Emergency Use Authorization (EUA). This EUA will remain  in effect (meaning this test can be used) for the duration of the COVID-19 declaration under Section 56 4(b)(1) of the Act, 21 U.S.C. section 360bbb-3(b)(1), unless the authorization is terminated or revoked sooner. Performed at [facility], [place] [address] [place], [place] [address]      Time coordinating discharge: Over 30 minutes  SIGNED:   Ezekiel Slocumb, DO [facility] 02/20/2019, 4:38 PM Pager [phone]  If 7PM-7AM, please contact night-coverage [email or web address] Password [email or web address]

## 2019-02-22 DIAGNOSIS — R519 Headache, unspecified: Secondary | ICD-10-CM

## 2019-04-06 ENCOUNTER — Telehealth: Payer: Self-pay

## 2019-04-06 NOTE — Telephone Encounter (Signed)
Confirmed virtual visit with patient. klh 

## 2019-04-08 ENCOUNTER — Telehealth: Payer: Self-pay

## 2019-04-08 ENCOUNTER — Ambulatory Visit (INDEPENDENT_AMBULATORY_CARE_PROVIDER_SITE_OTHER): Payer: 59 | Admitting: Internal Medicine

## 2019-04-08 ENCOUNTER — Encounter: Payer: Self-pay | Admitting: Internal Medicine

## 2019-04-08 VITALS — BP 148/83 | HR 96 | Temp 98.1°F | Ht 65.0 in | Wt 158.0 lb

## 2019-04-08 DIAGNOSIS — G4734 Idiopathic sleep related nonobstructive alveolar hypoventilation: Secondary | ICD-10-CM | POA: Diagnosis not present

## 2019-04-08 DIAGNOSIS — J449 Chronic obstructive pulmonary disease, unspecified: Secondary | ICD-10-CM | POA: Diagnosis not present

## 2019-04-08 DIAGNOSIS — J4520 Mild intermittent asthma, uncomplicated: Secondary | ICD-10-CM | POA: Diagnosis not present

## 2019-04-08 DIAGNOSIS — Z9981 Dependence on supplemental oxygen: Secondary | ICD-10-CM

## 2019-04-08 MED ORDER — IPRATROPIUM-ALBUTEROL 0.5-2.5 (3) MG/3ML IN SOLN: 3.0000 mL | RESPIRATORY_TRACT | 0 refills | Status: DC | PRN

## 2019-04-08 MED ORDER — ALBUTEROL SULFATE HFA 108 (90 BASE) MCG/ACT IN AERS: 2.0000 | INHALATION_SPRAY | Freq: Four times a day (QID) | RESPIRATORY_TRACT | 2 refills | Status: DC | PRN

## 2019-04-08 NOTE — Progress Notes (Signed)
[facility] [place], [place] [address]  Internal MEDICINE  Telephone Visit  Patient Name: Rachael Jordan  [id number]  [id number]  Date of Service: 04/08/2019  I connected with the patient at 1058 by telephone and verified the patients identity using two identifiers.   I discussed the limitations, risks, security and privacy concerns of performing an evaluation and management service by telephone and the availability of in person appointments. I also discussed with the patient that there may be a patient responsible charge related to the service.  The patient expressed understanding and agrees to proceed.    Chief Complaint  Patient presents with  . Telephone Assessment  . Telephone Screen  . Follow-up    HAS BEEN FEELING LIKE SHE HAS TO GASP FOR AIR THROUGHOUT THE DAYS AND HAS BEEN GOING ON SINCE NOVEMBER   . Shortness of Breath    HPI  PT seen via video. She is following up on COPD, and asthma.  She is currently doing well.  She had some issues with intermittent short gasp for air.  It has been awhile since this happened to her.  She has not been hospitalized since December 3rd, and that was for dizziness.  At the time the hospital doctors contributed it to vertigo, there was concern for possible CVA, since patient has a history of same.  That was ruled out while inpatient.  She has not had any episodes since.  Her breathing has been good. She is using her inhalers and nebulizer without difficulty.    Current Medication: Outpatient Encounter Medications as of 04/08/2019  Medication Sig Note  . albuterol (PROVENTIL) (2.5 MG/3ML) 0.083% nebulizer solution Take 2.5 mg by nebulization every 6 (six) hours as needed for wheezing or shortness of breath.   Marland Kitchen aspirin EC 81 MG tablet Take 81 mg by mouth daily.   Marland Kitchen atorvastatin (LIPITOR) 20 MG tablet Take 20 mg by mouth every evening.    Marland Kitchen azelastine (ASTELIN) 0.1 % nasal spray Place 1-2 sprays into both nostrils 2  (two) times daily.    . Biotin 10000 MCG TABS Take 10 mg by mouth daily.    . budesonide-formoterol (SYMBICORT) 80-4.5 MCG/ACT inhaler Inhale 2 puffs into the lungs 2 (two) times daily. 02/18/2019: Patient reports taking but no confirming pharmacy record  . Calcium Carbonate-Vitamin D3 600-400 MG-UNIT TABS Take 1 tablet by mouth daily.    . cetirizine (ZYRTEC) 10 MG tablet Take 10 mg by mouth at bedtime.    . Cholecalciferol (VITAMIN D-1000 MAX ST) 25 MCG (1000 UT) tablet Take 1,000 Units by mouth daily.    Marland Kitchen docusate sodium (COLACE) 100 MG capsule Take 100 mg by mouth daily as needed for mild constipation.    . DULoxetine (CYMBALTA) 30 MG capsule Take 30 mg by mouth daily.    . fluticasone (FLONASE) 50 MCG/ACT nasal spray Place 2 sprays into both nostrils daily.   . hydrocortisone 2.5 % ointment Apply 1 application topically 2 (two) times daily as needed (skin dryness/itching).    Marland Kitchen ipratropium-albuterol (DUONEB) 0.5-2.5 (3) MG/3ML SOLN Take 3 mLs by nebulization every 4 (four) hours as needed.   . linaclotide (LINZESS) 72 MCG capsule Take 72 mcg by mouth daily before breakfast.   . montelukast (SINGULAIR) 10 MG tablet Take 10 mg by mouth at bedtime.    . Multiple Vitamins-Minerals (CENTRUM ADULTS PO) Take 1 tablet by mouth daily.    . nortriptyline (PAMELOR) 10 MG capsule Take 10 mg by mouth at  bedtime.   Marland Kitchen olmesartan (BENICAR) 20 MG tablet Take 10 mg by mouth daily.   Marland Kitchen oxyCODONE (ROXICODONE) 15 MG immediate release tablet Take 15 mg by mouth 4 (four) times daily as needed (severe pain).    Marland Kitchen oxyCODONE-acetaminophen (PERCOCET/ROXICET) 5-325 MG tablet Take 1-2 tablets by mouth every 4 (four) hours as needed for moderate pain or severe pain.   . promethazine (PHENERGAN) 25 MG tablet Take 1 tablet (25 mg total) by mouth every 6 (six) hours as needed for nausea or vomiting.   . rizatriptan (MAXALT) 10 MG tablet Take 10 mg by mouth as needed for migraine. May repeat in 2 hours if needed   .  tiZANidine (ZANAFLEX) 4 MG tablet Take 4 mg by mouth 4 (four) times daily as needed for muscle spasms.    . traZODone (DESYREL) 100 MG tablet Take 200 mg by mouth at bedtime.    . trospium (SANCTURA) 20 MG tablet Take 20 mg by mouth 2 (two) times daily.    . valACYclovir (VALTREX) 1000 MG tablet Take 1,000 mg by mouth daily.    Marland Kitchen venlafaxine XR (EFFEXOR-XR) 75 MG 24 hr capsule Take 37.5 mg by mouth daily.    . vitamin C (ASCORBIC ACID) 500 MG tablet Take 500 mg by mouth daily.   . [DISCONTINUED] ipratropium-albuterol (DUONEB) 0.5-2.5 (3) MG/3ML SOLN Take 3 mLs by nebulization every 4 (four) hours as needed.   Marland Kitchen albuterol (VENTOLIN HFA) 108 (90 Base) MCG/ACT inhaler Inhale 2 puffs into the lungs every 6 (six) hours as needed for wheezing or shortness of breath.    No facility-administered encounter medications on file as of 04/08/2019.    Surgical History: Past Surgical History:  Procedure Laterality Date  . ABDOMINAL HYSTERECTOMY     vaginal  . APPENDECTOMY    . COLONOSCOPY WITH PROPOFOL N/A 12/29/2018   Procedure: COLONOSCOPY WITH PROPOFOL;  Surgeon: Lucilla Lame, MD;  Location: [facility] ENDOSCOPY;  Service: Endoscopy;  Laterality: N/A;  . FOOT SURGERY Left    10/2015  . NECK SURGERY      Medical History: Past Medical History:  Diagnosis Date  . Acid reflux   . Anxiety   . Arthritis   . Bronchitis 09/2015  . Gross hematuria   . H. pylori infection   . Heart murmur   . Kidney failure   . 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 Lung abnormality    "damaged lung due to pneumonia"  . Neck pain   . Nephrolithiasis   . Pneumonia   . Pneumothorax   . Scoliosis   . Urinary frequency   . Vaginal atrophy     Family History: Family History  Problem Relation Age of Onset  . Arthritis Mother   . Asthma Mother   . Hyperlipidemia Mother   . Hypertension Mother   . Diabetes Sister   . Cancer Maternal Aunt        esophagus  . Breast cancer Neg Hx   . Kidney cancer Neg Hx    . Bladder Cancer Neg Hx     Social History   Socioeconomic History  . Marital status: Legally Separated    Spouse name: Not on file  . Number of children: Not on file  . Years of education: Not on file  . Highest education level: Not on file  Occupational History  . Not on file  Tobacco Use  . Smoking status: Never Smoker  . Smokeless tobacco: Never Used  Substance and Sexual Activity  .  Alcohol use: No    Alcohol/week: 0.0 standard drinks  . Drug use: No  . Sexual activity: Not on file  Other Topics Concern  . Not on file  Social History Narrative  . Not on file   Social Determinants of Health   Financial Resource Strain:   . Difficulty of Paying Living Expenses: Not on file  Food Insecurity:   . Worried About Charity fundraiser in the Last Year: Not on file  . Ran Out of Food in the Last Year: Not on file  Transportation Needs:   . Lack of Transportation (Medical): Not on file  . Lack of Transportation (Non-Medical): Not on file  Physical Activity:   . Days of Exercise per Week: Not on file  . Minutes of Exercise per Session: Not on file  Stress:   . Feeling of Stress : Not on file  Social Connections:   . Frequency of Communication with Friends and Family: Not on file  . Frequency of Social Gatherings with Friends and Family: Not on file  . Attends Religious Services: Not on file  . Active Member of Clubs or Organizations: Not on file  . Attends Archivist Meetings: Not on file  . Marital Status: Not on file  Intimate Partner Violence:   . Fear of Current or Ex-Partner: Not on file  . Emotionally Abused: Not on file  . Physically Abused: Not on file  . Sexually Abused: Not on file      Review of Systems  Constitutional: Negative for chills, fatigue and unexpected weight change.  HENT: Negative for congestion, rhinorrhea, sneezing and sore throat.   Eyes: Negative for photophobia, pain and redness.  Respiratory: Negative for cough, chest  tightness and shortness of breath.   Cardiovascular: Negative for chest pain and palpitations.  Gastrointestinal: Negative for abdominal pain, constipation, diarrhea, nausea and vomiting.  Endocrine: Negative.   Genitourinary: Negative for dysuria and frequency.  Musculoskeletal: Negative for arthralgias, back pain, joint swelling and neck pain.  Skin: Negative for rash.  Allergic/Immunologic: Negative.   Neurological: Negative for tremors and numbness.  Hematological: Negative for adenopathy. Does not bruise/bleed easily.  Psychiatric/Behavioral: Negative for behavioral problems and sleep disturbance. The patient is not nervous/anxious.     Vital Signs: BP (!) 148/83   Pulse 96   Temp 98.1 F (36.7 C)   Ht 5' 5"  (1.651 m)   Wt 158 lb (71.7 kg)   BMI 26.29 kg/m    Observation/Objective:  Well appearing, NAD noted   Assessment/Plan: 1. Obstructive chronic bronchitis without exacerbation (HCC) Stable, continue present management.   2. Mild intermittent asthma without complication Refilled duonebs, and albuterol MDI. - albuterol (VENTOLIN HFA) 108 (90 Base) MCG/ACT inhaler; Inhale 2 puffs into the lungs every 6 (six) hours as needed for wheezing or shortness of breath.  Dispense: 18 g; Refill: 2 - ipratropium-albuterol (DUONEB) 0.5-2.5 (3) MG/3ML SOLN; Take 3 mLs by nebulization every 4 (four) hours as needed.  Dispense: 120 mL; Refill: 0  3. Oxygen dependent Continues to use oxygen.   4. Nocturnal hypoxia Continue to use oxygen at 3 lpm when sleeping as ordered.  General Counseling: Elwood verbalizes understanding of the findings of today's phone visit and agrees with plan of treatment. I have discussed any further diagnostic evaluation that may be needed or ordered today. We also reviewed her medications today. she has been encouraged to call the office with any questions or concerns that should arise related to todays visit.  No orders of the defined types were  placed in this encounter.   Meds ordered this encounter  Medications  . albuterol (VENTOLIN HFA) 108 (90 Base) MCG/ACT inhaler    Sig: Inhale 2 puffs into the lungs every 6 (six) hours as needed for wheezing or shortness of breath.    Dispense:  18 g    Refill:  2  . ipratropium-albuterol (DUONEB) 0.5-2.5 (3) MG/3ML SOLN    Sig: Take 3 mLs by nebulization every 4 (four) hours as needed.    Dispense:  120 mL    Refill:  0    Time spent: [place] AGNP-C Internal medicine

## 2019-04-08 NOTE — Telephone Encounter (Signed)
COMPLETED RECORDS REQUESTED FROM [facility]. FAXED RECORD PMT FORM AND PLACED RECORDS IN MAIL. COPY OF RECORDS REQUEST PLACED IN SCAN.

## 2019-04-13 ENCOUNTER — Ambulatory Visit
Admission: RE | Admit: 2019-04-13 | Discharge: 2019-04-13 | Disposition: A | Discharge status: Home / Self Care | Payer: 59 | Source: Ambulatory Visit | Attending: Family Medicine | Admitting: Family Medicine

## 2019-04-13 DIAGNOSIS — Z1231 Encounter for screening mammogram for malignant neoplasm of breast: Secondary | ICD-10-CM

## 2019-04-15 ENCOUNTER — Other Ambulatory Visit: Payer: Self-pay | Admitting: Family Medicine

## 2019-04-15 DIAGNOSIS — N6489 Other specified disorders of breast: Secondary | ICD-10-CM

## 2019-04-15 DIAGNOSIS — R928 Other abnormal and inconclusive findings on diagnostic imaging of breast: Secondary | ICD-10-CM

## 2019-04-16 ENCOUNTER — Ambulatory Visit
Admission: RE | Admit: 2019-04-16 | Discharge: 2019-04-16 | Disposition: A | Discharge status: Home / Self Care | Payer: 59 | Source: Ambulatory Visit | Attending: Family Medicine | Admitting: Family Medicine

## 2019-04-16 DIAGNOSIS — R928 Other abnormal and inconclusive findings on diagnostic imaging of breast: Secondary | ICD-10-CM | POA: Insufficient documentation

## 2019-04-16 DIAGNOSIS — N6489 Other specified disorders of breast: Secondary | ICD-10-CM | POA: Insufficient documentation

## 2019-04-21 ENCOUNTER — Other Ambulatory Visit: Payer: Self-pay | Admitting: Family Medicine

## 2019-04-21 ENCOUNTER — Other Ambulatory Visit: Payer: Self-pay | Admitting: General Surgery

## 2019-04-21 DIAGNOSIS — N6310 Unspecified lump in the right breast, unspecified quadrant: Secondary | ICD-10-CM

## 2019-04-21 DIAGNOSIS — R928 Other abnormal and inconclusive findings on diagnostic imaging of breast: Secondary | ICD-10-CM

## 2019-04-26 ENCOUNTER — Ambulatory Visit
Admission: RE | Admit: 2019-04-26 | Discharge: 2019-04-26 | Disposition: A | Discharge status: Home / Self Care | Payer: 59 | Source: Ambulatory Visit | Attending: Family Medicine | Admitting: Family Medicine

## 2019-04-26 DIAGNOSIS — N6310 Unspecified lump in the right breast, unspecified quadrant: Secondary | ICD-10-CM

## 2019-04-26 DIAGNOSIS — R928 Other abnormal and inconclusive findings on diagnostic imaging of breast: Secondary | ICD-10-CM | POA: Insufficient documentation

## 2019-04-28 LAB — SURGICAL PATHOLOGY

## 2019-05-12 ENCOUNTER — Ambulatory Visit: Payer: 59

## 2019-05-12 ENCOUNTER — Other Ambulatory Visit: Payer: Self-pay | Admitting: Family Medicine

## 2019-05-12 DIAGNOSIS — R1012 Left upper quadrant pain: Secondary | ICD-10-CM

## 2019-05-13 ENCOUNTER — Ambulatory Visit
Admission: RE | Admit: 2019-05-13 | Discharge: 2019-05-13 | Disposition: A | Discharge status: Home / Self Care | Payer: 59 | Source: Ambulatory Visit | Attending: Family Medicine | Admitting: Family Medicine

## 2019-05-13 ENCOUNTER — Other Ambulatory Visit: Payer: Self-pay

## 2019-05-13 DIAGNOSIS — R1012 Left upper quadrant pain: Secondary | ICD-10-CM | POA: Insufficient documentation

## 2019-06-04 ENCOUNTER — Other Ambulatory Visit: Payer: Self-pay

## 2019-06-04 ENCOUNTER — Emergency Department: Payer: 59

## 2019-06-04 ENCOUNTER — Emergency Department
Admission: EM | Admit: 2019-06-04 | Discharge: 2019-06-04 | Disposition: A | Discharge status: Home / Self Care | Payer: 59 | Attending: Emergency Medicine | Admitting: Emergency Medicine

## 2019-06-04 DIAGNOSIS — G8929 Other chronic pain: Secondary | ICD-10-CM

## 2019-06-04 DIAGNOSIS — I10 Essential (primary) hypertension: Secondary | ICD-10-CM | POA: Diagnosis not present

## 2019-06-04 DIAGNOSIS — M419 Scoliosis, unspecified: Secondary | ICD-10-CM | POA: Insufficient documentation

## 2019-06-04 DIAGNOSIS — M48062 Spinal stenosis, lumbar region with neurogenic claudication: Secondary | ICD-10-CM | POA: Diagnosis not present

## 2019-06-04 DIAGNOSIS — Z79899 Other long term (current) drug therapy: Secondary | ICD-10-CM | POA: Insufficient documentation

## 2019-06-04 DIAGNOSIS — R296 Repeated falls: Secondary | ICD-10-CM | POA: Diagnosis not present

## 2019-06-04 DIAGNOSIS — M5441 Lumbago with sciatica, right side: Secondary | ICD-10-CM | POA: Insufficient documentation

## 2019-06-04 DIAGNOSIS — R519 Headache, unspecified: Secondary | ICD-10-CM | POA: Insufficient documentation

## 2019-06-04 DIAGNOSIS — M5440 Lumbago with sciatica, unspecified side: Secondary | ICD-10-CM

## 2019-06-04 LAB — COMPREHENSIVE METABOLIC PANEL
ALT: 11 U/L (ref 0–44)
AST: 21 U/L (ref 15–41)
Albumin: 3.9 g/dL (ref 3.5–5.0)
Alkaline Phosphatase: 51 U/L (ref 38–126)
Anion gap: 11 (ref 5–15)
BUN: 7 mg/dL — ABNORMAL LOW (ref 8–23)
CO2: 27 mmol/L (ref 22–32)
Calcium: 9.1 mg/dL (ref 8.9–10.3)
Chloride: 106 mmol/L (ref 98–111)
Creatinine, Ser: 0.99 mg/dL (ref 0.44–1.00)
GFR calc Af Amer: >60 mL/min (ref >60)
GFR calc non Af Amer: >60 mL/min (ref >60)
Glucose, Bld: 117 mg/dL — ABNORMAL HIGH (ref 70–99)
Potassium: 3 mmol/L — ABNORMAL LOW (ref 3.5–5.1)
Sodium: 144 mmol/L (ref 135–145)
Total Bilirubin: 0.6 mg/dL (ref 0.3–1.2)
Total Protein: 7.3 g/dL (ref 6.5–8.1)

## 2019-06-04 LAB — CBC
HCT: 31.9 % — ABNORMAL LOW (ref 36.0–46.0)
Hemoglobin: 10.6 g/dL — ABNORMAL LOW (ref 12.0–15.0)
MCH: 29.8 pg (ref 26.0–34.0)
MCHC: 33.2 g/dL (ref 30.0–36.0)
MCV: 89.6 fL (ref 80.0–100.0)
Platelets: 400 10*3/uL (ref 150–400)
RBC: 3.56 MIL/uL — ABNORMAL LOW (ref 3.87–5.11)
RDW: 12.7 % (ref 11.5–15.5)
WBC: 7.4 10*3/uL (ref 4.0–10.5)
nRBC: 0 % (ref 0.0–0.2)

## 2019-06-04 LAB — LIPASE, BLOOD: Lipase: 25 U/L (ref 11–51)

## 2019-06-04 MED ORDER — METOCLOPRAMIDE HCL 5 MG/ML IJ SOLN
10.0000 mg | Freq: Once | INTRAMUSCULAR | Status: DC
  Filled 2019-06-04: qty 2

## 2019-06-04 MED ORDER — METOCLOPRAMIDE HCL 5 MG/ML IJ SOLN
10.0000 mg | Freq: Once | INTRAMUSCULAR | Status: AC
  Administered 2019-06-04: 10 mg via INTRAMUSCULAR

## 2019-06-04 MED ORDER — SODIUM CHLORIDE 0.9% FLUSH: 3.0000 mL | Freq: Once | INTRAVENOUS | Status: DC

## 2019-06-04 MED ORDER — DIAZEPAM 5 MG PO TABS: 5.0000 mg | ORAL_TABLET | Freq: Three times a day (TID) | ORAL | 0 refills | Status: AC | PRN

## 2019-06-04 MED ORDER — KETOROLAC TROMETHAMINE 30 MG/ML IJ SOLN
30.0000 mg | Freq: Once | INTRAMUSCULAR | Status: AC
  Administered 2019-06-04: 30 mg via INTRAMUSCULAR
  Filled 2019-06-04: qty 1

## 2019-06-04 MED ORDER — HYDROMORPHONE HCL 1 MG/ML IJ SOLN
1.0000 mg | Freq: Once | INTRAMUSCULAR | Status: DC
  Filled 2019-06-04: qty 1

## 2019-06-04 MED ORDER — DIAZEPAM 5 MG PO TABS
5.0000 mg | ORAL_TABLET | Freq: Once | ORAL | Status: AC
  Administered 2019-06-04: 5 mg via ORAL
  Filled 2019-06-04: qty 1

## 2019-06-04 MED ORDER — HYDROMORPHONE HCL 1 MG/ML IJ SOLN
1.0000 mg | Freq: Once | INTRAMUSCULAR | Status: AC
  Administered 2019-06-04: 1 mg via INTRAMUSCULAR

## 2019-06-04 NOTE — ED Notes (Signed)
Pt to desk to inquire about wait times for this ER, asking if staff able to check wait times for [facility] and [facility]. Pt placed back in chair at this time, and provided with 2 warm blankets for comfort. Pt encouraged to stay and be seen by provider, once again apologized for and explained delay.

## 2019-06-04 NOTE — Discharge Instructions (Signed)
Continue take your normal pain medications as prescribed.  You may take the Valium up to 3 times daily for the next few days, only if needed for more severe spasms.  Follow-up with your primary doctor and pain management specialist.  Return to the ER for new, worsening, or persistent severe headache, back pain, weakness or numbness, fever, vomiting, or any other new or worsening symptoms that concern you.

## 2019-06-04 NOTE — ED Notes (Signed)
ED Provider at bedside.  IV order DC'd pt wants meds via IM route, orders rx'd  Pt reports pain 10/10 in Head and lower back  Head of bed lowered and lights dimmed  Reports sister will pick up pt

## 2019-06-04 NOTE — ED Triage Notes (Addendum)
Pt comes via POV from home with c/o back pain and headache. Pt states light sensitivity. Pt states this all started about a week.  Pt states PCP was going to schedule MRI.  Pt states pain in groin area too. Pt states pain is just shooting.  Pt states hip and lower back pain. Pt states nausea from headache. Mini neuro negative

## 2019-06-04 NOTE — ED Notes (Signed)
Repeat VS obtained by this RN. This RN apologized and explained delay to patient. Pt states understanding. VSS and WNL at this time.

## 2019-06-04 NOTE — ED Provider Notes (Signed)
[facility] Emergency Department Provider Note ____________________________________________   First MD Initiated Contact with Patient 06/04/19 1729     (approximate)  I have reviewed the triage vital signs and the nursing notes.   HISTORY  Chief Complaint Back Pain and Headache    HPI Rachael Jordan is a 64 y.o. female with PMH as noted below including chronic headaches and chronic back pain for which she is followed by pain management who presents with worsening headache and low back pain over the last several days.  The patient describes the headache as frontal, but also occurring over the right side of her head and towards the right neck.  It is associated with photophobia and nausea but no vomiting.  The patient states that she has had headaches like this for years, but this is worse than she has had previously.  She took her rizatriptan with no relief.  In addition she reports an exacerbation of her chronic back pain which has not responded to her normal oxycodone.  The pain is bilateral in the lumbar area and somewhat worse on the right.  She has pain rating down the back of her legs.  She denies any acute weakness or numbness and has no urinary symptoms.  She has had no new injury.  Past Medical History:  Diagnosis Date  . Acid reflux   . Anxiety   . Arthritis   . Bronchitis 09/2015  . Gross hematuria   . H. pylori infection   . Heart murmur   . Kidney failure   . 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 Lung abnormality    "damaged lung due to pneumonia"  . Neck pain   . Nephrolithiasis   . Pneumonia   . Pneumothorax   . Scoliosis   . Urinary frequency   . Vaginal atrophy     Patient Active Problem List   Diagnosis Date Noted  . Acute nonintractable headache   . Multiple falls 02/20/2019  . Myoclonic jerking 02/20/2019  . COPD (chronic obstructive pulmonary disease) ([place]) 02/20/2019  . Seasonal allergies 02/20/2019  . Insomnia  02/20/2019  . Depression with anxiety 02/20/2019  . AKI (acute kidney injury) ([place]) 02/20/2019  . Dizziness 02/18/2019  . Encounter for screening colonoscopy   . Bunion 05/06/2018  . Metatarsalgia of right foot 05/06/2018  . Age-related osteoporosis without current pathological fracture 03/31/2018  . False positive ana 03/31/2018  . Mouth dryness 02/18/2018  . Screening for osteoporosis 02/18/2018  . 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 Essential hypertension 01/06/2018  . Lacunar infarction ([place]) 01/06/2018  . Compression fracture of thoracic vertebra, sequela 07/12/2017  . Lumbar compression fracture, sequela 07/12/2017  . Nocturnal hypoxia 07/12/2017  . Simple chronic bronchitis ([place]) 07/12/2017  . Intractable back pain 06/05/2017  . Overweight (BMI 25.0-29.9) 04/08/2017  . Chronic pain syndrome 04/06/2017  . Sensory disturbance 01/08/2017  . Pain in the groin, right 10/28/2016  . Paresthesia of both hands 12/14/2015  . B12 deficiency 10/31/2015  . SOB (shortness of breath) 08/29/2015  . History of hematuria 07/12/2015  . Urinary frequency 07/12/2015  . Vaginal atrophy 07/12/2015  . HAV (hallux abducto valgus) 07/02/2015  . Overlapping toe 07/02/2015  . Hammertoe 07/02/2015  . LBP (low back pain) 01/17/2015  . Trochanteric bursitis of right hip 01/17/2015  . PNA (pneumonia) 12/28/2014  . DDD (degenerative disc disease), lumbar 08/15/2014  . Lumbar radicular syndrome 08/15/2014  . 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 DDD (degenerative  disc disease), cervical 08/15/2014  . Bilateral occipital neuralgia 08/15/2014  . Migraine 08/15/2014  . Sacroiliac joint dysfunction 08/15/2014  . Back pain, chronic 09/23/2013  . Chronic cervical pain 09/23/2013  . Arthralgia of temporomandibular joint 08/29/2013  . Absolute anemia 08/15/2013  . Chronic kidney disease (CKD), stage III (moderate) 08/15/2013  . Abnormal serum level  of alkaline phosphatase 08/15/2013  . Blush 08/15/2013  . Hematuria, microscopic 08/15/2013  . Inflamed nasal mucosa 08/15/2013  . Big thyroid 08/15/2013  . Disease of thyroid gland 08/15/2013  . Positive H. pylori test 08/15/2013  . Gastrointestinal ulcer due to Helicobacter pylori 81/85/6314  . Other specified bacterial intestinal infections 08/14/2013  . Kidney failure 08/09/2013  . SIRS (systemic inflammatory response syndrome) ([place]) 08/09/2013  . Systemic inflammatory response syndrome (SIRS) ([place]) 08/09/2013    Past Surgical History:  Procedure Laterality Date  . ABDOMINAL HYSTERECTOMY     vaginal  . APPENDECTOMY    . COLONOSCOPY WITH PROPOFOL N/A 12/29/2018   Procedure: COLONOSCOPY WITH PROPOFOL;  Surgeon: Lucilla Lame, MD;  Location: [facility] ENDOSCOPY;  Service: Endoscopy;  Laterality: N/A;  . FOOT SURGERY Left    10/2015  . NECK SURGERY      Prior to Admission medications   Medication Sig Start Date End Date Taking? Authorizing Provider  albuterol (PROVENTIL) (2.5 MG/3ML) 0.083% nebulizer solution Take 2.5 mg by nebulization every 6 (six) hours as needed for wheezing or shortness of breath.    [provider]  albuterol (VENTOLIN HFA) 108 (90 Base) MCG/ACT inhaler Inhale 2 puffs into the lungs every 6 (six) hours as needed for wheezing or shortness of breath. 04/08/19   Kendell Bane, NP  aspirin EC 81 MG tablet Take 81 mg by mouth daily.    [provider]  atorvastatin (LIPITOR) 20 MG tablet Take 20 mg by mouth every evening.     [provider]  azelastine (ASTELIN) 0.1 % nasal spray Place 1-2 sprays into both nostrils 2 (two) times daily.     [provider]  Biotin 10000 MCG TABS Take 10 mg by mouth daily.     [provider]  budesonide-formoterol (SYMBICORT) 80-4.5 MCG/ACT inhaler Inhale 2 puffs into the lungs 2 (two) times daily. 11/09/18   Allyne Gee, MD  Calcium Carbonate-Vitamin D3 600-400 MG-UNIT TABS Take 1 tablet  by mouth daily.     [provider]  cetirizine (ZYRTEC) 10 MG tablet Take 10 mg by mouth at bedtime.     [provider]  Cholecalciferol (VITAMIN D-1000 MAX ST) 25 MCG (1000 UT) tablet Take 1,000 Units by mouth daily.     [provider]  diazepam (VALIUM) 5 MG tablet Take 1 tablet (5 mg total) by mouth every 8 (eight) hours as needed for up to 3 days for muscle spasms. 06/04/19 06/07/19  Arta Silence, MD  docusate sodium (COLACE) 100 MG capsule Take 100 mg by mouth daily as needed for mild constipation.     [provider]  DULoxetine (CYMBALTA) 30 MG capsule Take 30 mg by mouth daily.  09/01/18   [provider]  fluticasone (FLONASE) 50 MCG/ACT nasal spray Place 2 sprays into both nostrils daily. 02/21/19   Nicole Kindred A, DO  hydrocortisone 2.5 % ointment Apply 1 application topically 2 (two) times daily as needed (skin dryness/itching).     [provider]  ipratropium-albuterol (DUONEB) 0.5-2.5 (3) MG/3ML SOLN Take 3 mLs by nebulization every 4 (four) hours as needed. 04/08/19  Kendell Bane, NP  linaclotide (LINZESS) 72 MCG capsule Take 72 mcg by mouth daily before breakfast.    [provider]  montelukast (SINGULAIR) 10 MG tablet Take 10 mg by mouth at bedtime.     [provider]  Multiple Vitamins-Minerals (CENTRUM ADULTS PO) Take 1 tablet by mouth daily.     [provider]  nortriptyline (PAMELOR) 10 MG capsule Take 10 mg by mouth at bedtime.    [provider]  olmesartan (BENICAR) 20 MG tablet Take 10 mg by mouth daily. 09/25/18 09/25/19  [provider]  oxyCODONE (ROXICODONE) 15 MG immediate release tablet Take 15 mg by mouth 4 (four) times daily as needed (severe pain).     [provider]  oxyCODONE-acetaminophen (PERCOCET/ROXICET) 5-325 MG tablet Take 1-2 tablets by mouth every 4 (four) hours as needed for moderate pain or severe pain. 01/01/19   Holley Raring Munsoor, MD    promethazine (PHENERGAN) 25 MG tablet Take 1 tablet (25 mg total) by mouth every 6 (six) hours as needed for nausea or vomiting. 01/01/19   Holley Raring Munsoor, MD  rizatriptan (MAXALT) 10 MG tablet Take 10 mg by mouth as needed for migraine. May repeat in 2 hours if needed    [provider]  tiZANidine (ZANAFLEX) 4 MG tablet Take 4 mg by mouth 4 (four) times daily as needed for muscle spasms.     [provider]  traZODone (DESYREL) 100 MG tablet Take 200 mg by mouth at bedtime.     [provider]  trospium (SANCTURA) 20 MG tablet Take 20 mg by mouth 2 (two) times daily.  08/28/18 08/28/19  [provider]  valACYclovir (VALTREX) 1000 MG tablet Take 1,000 mg by mouth daily.     [provider]  venlafaxine XR (EFFEXOR-XR) 75 MG 24 hr capsule Take 37.5 mg by mouth daily.  06/19/18 06/19/19  [provider]  vitamin C (ASCORBIC ACID) 500 MG tablet Take 500 mg by mouth daily.    [provider]    Allergies Grapefruit bioflavonoid complex, Grapefruit extract, Propoxyphene, and Prednisolone  Family History  Problem Relation Age of Onset  . Arthritis Mother   . Asthma Mother   . Hyperlipidemia Mother   . Hypertension Mother   . Diabetes Sister   . Cancer Maternal Aunt        esophagus  . Breast cancer Neg Hx   . Kidney cancer Neg Hx   . Bladder Cancer Neg Hx     Social History Social History   Tobacco Use  . Smoking status: Never Smoker  . Smokeless tobacco: Never Used  Substance Use Topics  . Alcohol use: No    Alcohol/week: 0.0 standard drinks  . Drug use: No    Review of Systems  Constitutional: No fever. Eyes: Positive for photophobia. ENT: No sore throat. Cardiovascular: Denies chest pain. Respiratory: Denies shortness of breath. Gastrointestinal: No vomiting. Genitourinary: Negative for dysuria or frequency.  Musculoskeletal: Positive for back pain. Skin: Negative for rash. Neurological: Positive for  headache.   ____________________________________________   PHYSICAL EXAM:  VITAL SIGNS: ED Triage Vitals  Enc Vitals Group     BP 06/04/19 1128 (!) 103/50     Pulse Rate 06/04/19 1128 85     Resp 06/04/19 1128 17     Temp 06/04/19 1128 98.5 F (36.9 C)     Temp src --      SpO2 06/04/19 1128 100 %     Weight 06/04/19 1129  155 lb (70.3 kg)     Height 06/04/19 1129 5' 5.5" (1.664 m)     Head Circumference --      Peak Flow --      Pain Score 06/04/19 1129 10     Pain Loc --      Pain Edu? --      Excl. in Early? --     Constitutional: Alert and oriented.  Uncomfortable appearing but in no acute distress. Eyes: Conjunctivae are normal.  EOMI.  PERRLA.  Mild photophobia. Head: Atraumatic. Nose: No congestion/rhinnorhea. Mouth/Throat: Mucous membranes are moist.   Neck: Normal range of motion.  Cardiovascular: Normal rate, regular rhythm.  Good peripheral circulation. Respiratory: Normal respiratory effort.  No retractions.  Gastrointestinal:  No distention.  Genitourinary: No CVA tenderness. Musculoskeletal: No lower extremity edema.  Bilateral paraspinal and midline lumbar tenderness with no step-off or crepitus. Neurologic:  Normal speech and language.  5/5 motor strength and intact sensation to all 4 extremities. Skin:  Skin is warm and dry. No rash noted. Psychiatric: Anxious appearing, tearful.  ____________________________________________   LABS (all labs ordered are listed, but only abnormal results are displayed)  Labs Reviewed  COMPREHENSIVE METABOLIC PANEL - Abnormal; Notable for the following components:      Result Value   Potassium 3.0 (*)    Glucose, Bld 117 (*)    BUN 7 (*)    All other components within normal limits  CBC - Abnormal; Notable for the following components:   RBC 3.56 (*)    Hemoglobin 10.6 (*)    HCT 31.9 (*)    All other components within normal limits  LIPASE, BLOOD  URINALYSIS, COMPLETE (UACMP) WITH MICROSCOPIC    ____________________________________________  EKG   ____________________________________________  RADIOLOGY  CT head: No acute abnormality CT lumbar spine: Degenerative changes at L5-S1 with no acute abnormality  ____________________________________________   PROCEDURES  Procedure(s) performed: No  Procedures  Critical Care performed: No ____________________________________________   INITIAL IMPRESSION / ASSESSMENT AND PLAN / ED COURSE  Pertinent labs & imaging results that were available during my care of the patient were reviewed by me and considered in my medical decision making (see chart for details).  64 year old female with PMH as noted above including chronic back pain and chronic headaches presents with an exacerbation of both of these occurring over the last several days and refractory to her normal medications.  The patient follows with pain management.  I reviewed the past medical records in epic.  The patient was most recently admitted in December 2020 with persistent dizziness and headache but also with intermittent spasms and difficulty with grip.  There was concern for possible posterior stroke.  CT of the head and C-spine and MRI of the brain were all negative for acute findings.  The patient recently had diverticulitis.  I see a record from follow-up visit with her PMD Dr. Iona Beard from yesterday, although it seems that the patient mentioned the headaches as a chronic issue and the back pain was not addressed.  The patient states that she subsequently called her pain management specialist who told her to come to the ER due to escalation of her symptoms.  On exam, the patient is anxious appearing and tearful.  She is slightly hypertensive with otherwise normal vital signs.  She has some photophobia but no nuchal rigidity or other meningeal signs, and a normal neurologic exam.  She has normal strength and sensation of bilateral lower extremities.  She does have  pain  on palpation of the low back and pain on movement of the legs.  Overall presentation is consistent with an acute exacerbation of the patient's chronic headache and back pain.  Given the relatively acute worsening of the symptoms and based on shared decision-making with the patient I will obtain a CT head and lumbar spine to evaluate for acute causes, especially given the patient's history of lower back compression fractures in the past.  There is no evidence of subarachnoid hemorrhage, meningitis, or other concerning etiology of the headache.  Given the normal lower extremity neurologic exam there is no evidence of cauda equina syndrome or other acute neurologic impingement.  I will treat symptomatically with Reglan and Dilaudid.  Patient apparently has significant psychiatric symptoms with prednisone and other steroids, so I will avoid these.  ----------------------------------------- 10:16 PM on 06/04/2019 -----------------------------------------  CT head and lumbar spine are negative for acute findings.  The lab work-up is unremarkable, although the patient has not provided a urine sample.  Her vital signs have remained stable.  The patient had difficult IV access, so we elected to give the medications IM.  She had minimal relief with Reglan and Dilaudid.  The patient then began having more spasm type pain in the low back and in a sciatic distribution.  I gave a dose of Toradol and p.o. Valium, and now she is feeling much better.  The patient states that she feels comfortable and wants to go home.  I counseled her on the results of work-up.  I advised that since she is managed at a pain clinic and is on chronic pain medications, I would not be able to provide additional analgesia today.  However, given that the Valium seem to work well as a muscle relaxant I will provide a small quantity for her to use over the next 1 or 2 days for this exacerbation.  The patient agrees with this plan.  I  gave her very thorough return precautions and she expressed understanding.  She is stable for discharge at this time. ____________________________________________   FINAL CLINICAL IMPRESSION(S) / ED DIAGNOSES  Final diagnoses:  Chronic nonintractable headache, unspecified headache type  Chronic bilateral low back pain with sciatica, sciatica laterality unspecified      NEW MEDICATIONS STARTED DURING THIS VISIT:  New Prescriptions   DIAZEPAM (VALIUM) 5 MG TABLET    Take 1 tablet (5 mg total) by mouth every 8 (eight) hours as needed for up to 3 days for muscle spasms.     Note:  This document was prepared using Dragon voice recognition software and may include unintentional dictation errors.    Arta Silence, MD 06/04/19 2219

## 2019-06-04 NOTE — ED Notes (Signed)
VS obtained by this RN. Pt c/o generalized body aches and HA at this time. This RN once again apologized for long wait and attempted to explain delay.

## 2019-06-16 DIAGNOSIS — M5126 Other intervertebral disc displacement, lumbar region: Secondary | ICD-10-CM | POA: Insufficient documentation

## 2019-06-18 ENCOUNTER — Other Ambulatory Visit: Payer: Self-pay

## 2019-06-18 ENCOUNTER — Emergency Department
Admission: EM | Admit: 2019-06-18 | Discharge: 2019-06-18 | Disposition: A | Discharge status: Home / Self Care | Payer: 59 | Attending: Emergency Medicine | Admitting: Emergency Medicine

## 2019-06-18 ENCOUNTER — Emergency Department: Payer: 59

## 2019-06-18 ENCOUNTER — Encounter: Payer: Self-pay | Admitting: Emergency Medicine

## 2019-06-18 DIAGNOSIS — R079 Chest pain, unspecified: Secondary | ICD-10-CM | POA: Diagnosis present

## 2019-06-18 DIAGNOSIS — Z7982 Long term (current) use of aspirin: Secondary | ICD-10-CM | POA: Insufficient documentation

## 2019-06-18 DIAGNOSIS — N183 Chronic kidney disease, stage 3 unspecified: Secondary | ICD-10-CM | POA: Insufficient documentation

## 2019-06-18 DIAGNOSIS — Z79899 Other long term (current) drug therapy: Secondary | ICD-10-CM | POA: Diagnosis not present

## 2019-06-18 DIAGNOSIS — I129 Hypertensive chronic kidney disease with stage 1 through stage 4 chronic kidney disease, or unspecified chronic kidney disease: Secondary | ICD-10-CM | POA: Diagnosis not present

## 2019-06-18 DIAGNOSIS — R0789 Other chest pain: Secondary | ICD-10-CM | POA: Diagnosis not present

## 2019-06-18 DIAGNOSIS — J449 Chronic obstructive pulmonary disease, unspecified: Secondary | ICD-10-CM | POA: Diagnosis not present

## 2019-06-18 LAB — BASIC METABOLIC PANEL
Anion gap: 11 (ref 5–15)
BUN: 9 mg/dL (ref 8–23)
CO2: 26 mmol/L (ref 22–32)
Calcium: 9.2 mg/dL (ref 8.9–10.3)
Chloride: 107 mmol/L (ref 98–111)
Creatinine, Ser: 1.18 mg/dL — ABNORMAL HIGH (ref 0.44–1.00)
GFR calc Af Amer: 58 mL/min — ABNORMAL LOW (ref >60)
GFR calc non Af Amer: 50 mL/min — ABNORMAL LOW (ref >60)
Glucose, Bld: 79 mg/dL (ref 70–99)
Potassium: 3.6 mmol/L (ref 3.5–5.1)
Sodium: 144 mmol/L (ref 135–145)

## 2019-06-18 LAB — CBC
HCT: 31.1 % — ABNORMAL LOW (ref 36.0–46.0)
Hemoglobin: 10.6 g/dL — ABNORMAL LOW (ref 12.0–15.0)
MCH: 29.6 pg (ref 26.0–34.0)
MCHC: 34.1 g/dL (ref 30.0–36.0)
MCV: 86.9 fL (ref 80.0–100.0)
Platelets: 314 10*3/uL (ref 150–400)
RBC: 3.58 MIL/uL — ABNORMAL LOW (ref 3.87–5.11)
RDW: 13.1 % (ref 11.5–15.5)
WBC: 8.3 10*3/uL (ref 4.0–10.5)
nRBC: 0 % (ref 0.0–0.2)

## 2019-06-18 LAB — FIBRIN DERIVATIVES D-DIMER (ARMC ONLY): Fibrin derivatives D-dimer (ARMC): 734.85 ng/mL (FEU) — ABNORMAL HIGH (ref 0.00–499.00)

## 2019-06-18 LAB — TROPONIN I (HIGH SENSITIVITY): Troponin I (High Sensitivity): 4 ng/L (ref <18)

## 2019-06-18 MED ORDER — SODIUM CHLORIDE 0.9% FLUSH: 3.0000 mL | Freq: Once | INTRAVENOUS | Status: DC

## 2019-06-18 MED ORDER — IOHEXOL 350 MG/ML SOLN
75.0000 mL | Freq: Once | INTRAVENOUS | Status: AC | PRN
  Administered 2019-06-18: 75 mL via INTRAVENOUS

## 2019-06-18 NOTE — ED Triage Notes (Signed)
Pt to ER states awoke with chest pain yesterday.  States was at pharmacy today and they checked her HR and BP which were both elevated.  States she came her for further evaluation.  Pt denies n/v/SHOB.  Pt states pain is sharp.

## 2019-06-18 NOTE — Discharge Instructions (Addendum)
Follow-up with your regular doctor.  Return to the ER for new, worsening, or persistent severe chest pain, shortness of breath, weakness, or any other new or worsening symptoms that concern you.

## 2019-06-18 NOTE — ED Notes (Signed)
Pt walked to hallway bathroom and did well. Pt back in bed at this time and covered with blankets. Pt denies needs.

## 2019-06-18 NOTE — ED Provider Notes (Signed)
[facility] Emergency Department Provider Note ____________________________________________   First MD Initiated Contact with Patient 06/18/19 1611     (approximate)  I have reviewed the triage vital signs and the nursing notes.   HISTORY  Chief Complaint Chest Pain    HPI Rachael Jordan is a 64 y.o. female with PMH as noted below who presents with chest pain, gradual onset about 2 days ago, persistent since then but intermittently worse, and sharp in quality.  Is worse with changes in position but not with breathing.  She denies any significant shortness of breath or lightheadedness.  The patient states that today she had her heart rate and blood pressure checked and both were elevated, so she was told to come to the ED.  She denies any leg pain or swelling.  She denies any prior history of this chest pain.  Past Medical History:  Diagnosis Date  . Acid reflux   . Anxiety   . Arthritis   . Bronchitis 09/2015  . Gross hematuria   . H. pylori infection   . Heart murmur   . Kidney failure   . 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 Lung abnormality    "damaged lung due to pneumonia"  . Neck pain   . Nephrolithiasis   . Pneumonia   . Pneumothorax   . Scoliosis   . Urinary frequency   . Vaginal atrophy     Patient Active Problem List   Diagnosis Date Noted  . Acute nonintractable headache   . Multiple falls 02/20/2019  . Myoclonic jerking 02/20/2019  . COPD (chronic obstructive pulmonary disease) ([place]) 02/20/2019  . Seasonal allergies 02/20/2019  . Insomnia 02/20/2019  . Depression with anxiety 02/20/2019  . AKI (acute kidney injury) ([place]) 02/20/2019  . Dizziness 02/18/2019  . Encounter for screening colonoscopy   . Bunion 05/06/2018  . Metatarsalgia of right foot 05/06/2018  . Age-related osteoporosis without current pathological fracture 03/31/2018  . False positive ana 03/31/2018  . Mouth dryness 02/18/2018  . Screening for  osteoporosis 02/18/2018  . 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 Essential hypertension 01/06/2018  . Lacunar infarction ([place]) 01/06/2018  . Compression fracture of thoracic vertebra, sequela 07/12/2017  . Lumbar compression fracture, sequela 07/12/2017  . Nocturnal hypoxia 07/12/2017  . Simple chronic bronchitis ([place]) 07/12/2017  . Intractable back pain 06/05/2017  . Overweight (BMI 25.0-29.9) 04/08/2017  . Chronic pain syndrome 04/06/2017  . Sensory disturbance 01/08/2017  . Pain in the groin, right 10/28/2016  . Paresthesia of both hands 12/14/2015  . B12 deficiency 10/31/2015  . SOB (shortness of breath) 08/29/2015  . History of hematuria 07/12/2015  . Urinary frequency 07/12/2015  . Vaginal atrophy 07/12/2015  . HAV (hallux abducto valgus) 07/02/2015  . Overlapping toe 07/02/2015  . Hammertoe 07/02/2015  . LBP (low back pain) 01/17/2015  . Trochanteric bursitis of right hip 01/17/2015  . PNA (pneumonia) 12/28/2014  . DDD (degenerative disc disease), lumbar 08/15/2014  . Lumbar radicular syndrome 08/15/2014  . 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 DDD (degenerative disc disease), cervical 08/15/2014  . Bilateral occipital neuralgia 08/15/2014  . Migraine 08/15/2014  . Sacroiliac joint dysfunction 08/15/2014  . Back pain, chronic 09/23/2013  . Chronic cervical pain 09/23/2013  . Arthralgia of temporomandibular joint 08/29/2013  . Absolute anemia 08/15/2013  . Chronic kidney disease (CKD), stage III (moderate) 08/15/2013  . Abnormal serum level of alkaline phosphatase 08/15/2013  . Blush 08/15/2013  . Hematuria, microscopic 08/15/2013  .  Inflamed nasal mucosa 08/15/2013  . Big thyroid 08/15/2013  . Disease of thyroid gland 08/15/2013  . Positive H. pylori test 08/15/2013  . Gastrointestinal ulcer due to Helicobacter pylori 01/12/2535  . Other specified bacterial intestinal infections 08/14/2013  . Kidney failure  08/09/2013  . SIRS (systemic inflammatory response syndrome) ([place]) 08/09/2013  . Systemic inflammatory response syndrome (SIRS) ([place]) 08/09/2013    Past Surgical History:  Procedure Laterality Date  . ABDOMINAL HYSTERECTOMY     vaginal  . APPENDECTOMY    . COLONOSCOPY WITH PROPOFOL N/A 12/29/2018   Procedure: COLONOSCOPY WITH PROPOFOL;  Surgeon: Lucilla Lame, MD;  Location: [facility] ENDOSCOPY;  Service: Endoscopy;  Laterality: N/A;  . FOOT SURGERY Left    10/2015  . NECK SURGERY      Prior to Admission medications   Medication Sig Start Date End Date Taking? Authorizing Provider  albuterol (PROVENTIL) (2.5 MG/3ML) 0.083% nebulizer solution Take 2.5 mg by nebulization every 6 (six) hours as needed for wheezing or shortness of breath.    [provider]  albuterol (VENTOLIN HFA) 108 (90 Base) MCG/ACT inhaler Inhale 2 puffs into the lungs every 6 (six) hours as needed for wheezing or shortness of breath. 04/08/19   Kendell Bane, NP  aspirin EC 81 MG tablet Take 81 mg by mouth daily.    [provider]  atorvastatin (LIPITOR) 20 MG tablet Take 20 mg by mouth every evening.     [provider]  azelastine (ASTELIN) 0.1 % nasal spray Place 1-2 sprays into both nostrils 2 (two) times daily.     [provider]  Biotin 10000 MCG TABS Take 10 mg by mouth daily.     [provider]  budesonide-formoterol (SYMBICORT) 80-4.5 MCG/ACT inhaler Inhale 2 puffs into the lungs 2 (two) times daily. 11/09/18   Allyne Gee, MD  Calcium Carbonate-Vitamin D3 600-400 MG-UNIT TABS Take 1 tablet by mouth daily.     [provider]  cetirizine (ZYRTEC) 10 MG tablet Take 10 mg by mouth at bedtime.     [provider]  Cholecalciferol (VITAMIN D-1000 MAX ST) 25 MCG (1000 UT) tablet Take 1,000 Units by mouth daily.     [provider]  docusate sodium (COLACE) 100 MG capsule Take 100 mg by mouth daily as needed for mild constipation.      [provider]  DULoxetine (CYMBALTA) 30 MG capsule Take 30 mg by mouth daily.  09/01/18   [provider]  fluticasone (FLONASE) 50 MCG/ACT nasal spray Place 2 sprays into both nostrils daily. 02/21/19   Nicole Kindred A, DO  hydrocortisone 2.5 % ointment Apply 1 application topically 2 (two) times daily as needed (skin dryness/itching).     [provider]  ipratropium-albuterol (DUONEB) 0.5-2.5 (3) MG/3ML SOLN Take 3 mLs by nebulization every 4 (four) hours as needed. 04/08/19   Kendell Bane, NP  linaclotide (LINZESS) 72 MCG capsule Take 72 mcg by mouth daily before breakfast.    [provider]  montelukast (SINGULAIR) 10 MG tablet Take 10 mg by mouth at bedtime.     [provider]  Multiple Vitamins-Minerals (CENTRUM ADULTS PO) Take 1 tablet by mouth daily.     [provider]  nortriptyline (PAMELOR) 10 MG capsule Take 10 mg by mouth at bedtime.    [provider]  olmesartan (BENICAR) 20 MG tablet Take 10 mg by mouth daily. 09/25/18 09/25/19  [provider]  oxyCODONE (ROXICODONE) 15 MG immediate release tablet  Take 15 mg by mouth 4 (four) times daily as needed (severe pain).     [provider]  oxyCODONE-acetaminophen (PERCOCET/ROXICET) 5-325 MG tablet Take 1-2 tablets by mouth every 4 (four) hours as needed for moderate pain or severe pain. 01/01/19   Holley Raring Munsoor, MD  promethazine (PHENERGAN) 25 MG tablet Take 1 tablet (25 mg total) by mouth every 6 (six) hours as needed for nausea or vomiting. 01/01/19   Holley Raring Munsoor, MD  rizatriptan (MAXALT) 10 MG tablet Take 10 mg by mouth as needed for migraine. May repeat in 2 hours if needed    [provider]  tiZANidine (ZANAFLEX) 4 MG tablet Take 4 mg by mouth 4 (four) times daily as needed for muscle spasms.     [provider]  traZODone (DESYREL) 100 MG tablet Take 200 mg by mouth at bedtime.     [provider]  trospium  (SANCTURA) 20 MG tablet Take 20 mg by mouth 2 (two) times daily.  08/28/18 08/28/19  [provider]  valACYclovir (VALTREX) 1000 MG tablet Take 1,000 mg by mouth daily.     [provider]  venlafaxine XR (EFFEXOR-XR) 75 MG 24 hr capsule Take 37.5 mg by mouth daily.  06/19/18 06/19/19  [provider]  vitamin C (ASCORBIC ACID) 500 MG tablet Take 500 mg by mouth daily.    [provider]    Allergies Grapefruit bioflavonoid complex, Grapefruit extract, Propoxyphene, and Prednisolone  Family History  Problem Relation Age of Onset  . Arthritis Mother   . Asthma Mother   . Hyperlipidemia Mother   . Hypertension Mother   . Diabetes Sister   . Cancer Maternal Aunt        esophagus  . Breast cancer Neg Hx   . Kidney cancer Neg Hx   . Bladder Cancer Neg Hx     Social History Social History   Tobacco Use  . Smoking status: Never Smoker  . Smokeless tobacco: Never Used  Substance Use Topics  . Alcohol use: No    Alcohol/week: 0.0 standard drinks  . Drug use: No    Review of Systems  Constitutional: No fever. Eyes: No redness. ENT: No sore throat. Cardiovascular: Positive for chest pain. Respiratory: Denies shortness of breath. Gastrointestinal: No vomiting or diarrhea.  Genitourinary: Negative for flank pain. Musculoskeletal: Negative for back pain. Skin: Negative for rash. Neurological: Negative for headache.   ____________________________________________   PHYSICAL EXAM:  VITAL SIGNS: ED Triage Vitals  Enc Vitals Group     BP 06/18/19 1451 (!) 177/94     Pulse Rate 06/18/19 1451 (!) 117     Resp 06/18/19 1451 18     Temp 06/18/19 1451 98.3 F (36.8 C)     Temp src --      SpO2 06/18/19 1451 98 %     Weight 06/18/19 1446 154 lb 15.7 oz (70.3 kg)     Height 06/18/19 1446 5' 5"  (1.651 m)     Head Circumference --      Peak Flow --      Pain Score 06/18/19 1446 9     Pain Loc --      Pain Edu? --      Excl. in [place]? --      Constitutional: Alert and oriented. Well appearing and in no acute distress. Eyes: Conjunctivae are normal.  Head: Atraumatic. Nose: No congestion/rhinnorhea. Mouth/Throat: Mucous membranes are moist.   Neck: Normal range of motion.  Cardiovascular: Borderline tachycardic,  regular rhythm. Grossly normal heart sounds.  Good peripheral circulation. Respiratory: Normal respiratory effort.  No retractions. Lungs CTAB. Gastrointestinal: No distention.  Musculoskeletal: No lower extremity edema.  No calf or popliteal swelling or tenderness.  Extremities warm and well perfused.  Neurologic:  Normal speech and language. No gross focal neurologic deficits are appreciated.  Skin:  Skin is warm and dry. No rash noted. Psychiatric: Mood and affect are normal. Speech and behavior are normal.  ____________________________________________   LABS (all labs ordered are listed, but only abnormal results are displayed)  Labs Reviewed  BASIC METABOLIC PANEL - Abnormal; Notable for the following components:      Result Value   Creatinine, Ser 1.18 (*)    GFR calc non Af Amer 50 (*)    GFR calc Af Amer 58 (*)    All other components within normal limits  CBC - Abnormal; Notable for the following components:   RBC 3.58 (*)    Hemoglobin 10.6 (*)    HCT 31.1 (*)    All other components within normal limits  FIBRIN DERIVATIVES D-DIMER ([facility] ONLY) - Abnormal; Notable for the following components:   Fibrin derivatives D-dimer ([facility]) 734.85 (*)    All other components within normal limits  TROPONIN I (HIGH SENSITIVITY)   ____________________________________________  EKG  ED ECG REPORT I, Arta Silence, the attending physician, personally viewed and interpreted this ECG.  Date: 06/18/2019 EKG Time: 1450 Rate: 118 Rhythm: Sinus tachycardia QRS Axis: normal Intervals: normal ST/T Wave abnormalities: Nonspecific T wave abnormalities Narrative Interpretation: Nonspecific T wave  abnormalities when compared to EKG of 03/17/2019 with no evidence of acute ischemia ____________________________________________  RADIOLOGY  CXR: No focal infiltrate or other acute abnormality CT chest: No acute PE  ____________________________________________   PROCEDURES  Procedure(s) performed: No  Procedures  Critical Care performed: No ____________________________________________   INITIAL IMPRESSION / ASSESSMENT AND PLAN / ED COURSE  Pertinent labs & imaging results that were available during my care of the patient were reviewed by me and considered in my medical decision making (see chart for details).  64 year old female with PMH as noted above presents with somewhat atypical nonexertional sharp chest pain over the last few days which is positional and not associated with shortness of breath.  She denies any prior history of it  I reviewed the past medical records in [place].  Actually saw the patient in the ED a few weeks ago for an exacerbation of chronic low back pain.  The patient has no significant cardiac history and no prior MI.  She also has no history of PE or DVT.  On exam, she is overall well-appearing.  Her heart rate is borderline elevated as is her blood pressure with otherwise normal vital signs and an unremarkable exam.  EKG shows some nonspecific inferior T wave abnormalities but no significant ischemic findings.  The chest pain overall is most consistent with musculoskeletal etiology or possible radiculopathy.  Differential also includes GERD.  I have a low suspicion for ACS.  However, given the patient's unexplained tachycardia cannot rule out PE by [facility].  We will obtain basic labs, troponin, D-dimer, chest x-ray, and reassess.  ----------------------------------------- 6:46 PM on 06/18/2019 -----------------------------------------  Troponin is negative.  The D-dimer is somewhat elevated.  I have ordered a CT angio  chest.  ----------------------------------------- 7:17 PM on 06/18/2019 -----------------------------------------  The CT chest shows no evidence of PE.  Given the duration of the pain for 2 days, there is no indication for a repeat  troponin.  The patient's heart rate has normalized.  She is still slightly hypertensive but this does not require acute intervention.  The patient states that her doctor just increased her dose of her antihypertensive few days ago.  Given this there is no indication for further medication changes at this time.  I counseled the patient on the results of the work-up.  Return precautions given, and she expresses understanding. ____________________________________________   FINAL CLINICAL IMPRESSION(S) / ED DIAGNOSES  Final diagnoses:  Atypical chest pain      NEW MEDICATIONS STARTED DURING THIS VISIT:  New Prescriptions   No medications on file     Note:  This document was prepared using Dragon voice recognition software and may include unintentional dictation errors.    Arta Silence, MD 06/18/19 [phone]

## 2019-06-18 NOTE — ED Notes (Signed)
Phlebotomy called for labs

## 2019-06-25 ENCOUNTER — Other Ambulatory Visit: Payer: Self-pay | Admitting: Neurosurgery

## 2019-06-25 DIAGNOSIS — M5442 Lumbago with sciatica, left side: Secondary | ICD-10-CM

## 2019-06-25 DIAGNOSIS — G8929 Other chronic pain: Secondary | ICD-10-CM

## 2019-06-29 ENCOUNTER — Other Ambulatory Visit: Payer: Self-pay

## 2019-06-29 ENCOUNTER — Ambulatory Visit: Payer: 59 | Admitting: Orthotics

## 2019-06-29 DIAGNOSIS — M779 Enthesopathy, unspecified: Secondary | ICD-10-CM

## 2019-06-29 DIAGNOSIS — M7741 Metatarsalgia, right foot: Secondary | ICD-10-CM

## 2019-06-29 DIAGNOSIS — M21619 Bunion of unspecified foot: Secondary | ICD-10-CM

## 2019-06-29 NOTE — Progress Notes (Signed)
Patient has h/o metatarsalgia and wants f/o to go into 6" heels; after discussing the limited function of such f/o vs the money that will be spent she decided not to get them at this time.  She will got to shoe market and see if they can help her with a shoe selections.

## 2019-07-08 ENCOUNTER — Telehealth: Payer: Self-pay | Admitting: *Deleted

## 2019-07-08 NOTE — Telephone Encounter (Signed)
Pt states she was seen in office a week ago and wanted to know the diagnosis to look up.

## 2019-07-08 NOTE — Telephone Encounter (Signed)
Pt called and I informed of the diagnosis of 06/29/2019. Pt asked if they could be inherited and I told her yes and from over use or injury as well.

## 2019-07-08 NOTE — Telephone Encounter (Signed)
Left Message for pt to call for the information she had requested.

## 2019-07-22 ENCOUNTER — Other Ambulatory Visit: Payer: Self-pay

## 2019-07-22 ENCOUNTER — Ambulatory Visit
Admission: RE | Admit: 2019-07-22 | Discharge: 2019-07-22 | Disposition: A | Discharge status: Home / Self Care | Payer: 59 | Source: Ambulatory Visit | Attending: Neurosurgery | Admitting: Neurosurgery

## 2019-07-22 DIAGNOSIS — G8929 Other chronic pain: Secondary | ICD-10-CM | POA: Diagnosis present

## 2019-07-22 DIAGNOSIS — M5441 Lumbago with sciatica, right side: Secondary | ICD-10-CM | POA: Insufficient documentation

## 2019-07-22 DIAGNOSIS — M5442 Lumbago with sciatica, left side: Secondary | ICD-10-CM | POA: Insufficient documentation

## 2019-08-26 ENCOUNTER — Ambulatory Visit: Payer: 59 | Admitting: Podiatry

## 2019-10-08 ENCOUNTER — Telehealth: Payer: Self-pay

## 2019-10-08 NOTE — Telephone Encounter (Signed)
Patient rescheduled appointment on 10/11/2019 to 10/26/2019. klh

## 2019-10-11 ENCOUNTER — Ambulatory Visit: Payer: 59 | Admitting: Internal Medicine

## 2019-10-25 ENCOUNTER — Telehealth: Payer: Self-pay

## 2019-10-25 NOTE — Telephone Encounter (Signed)
Called to confirm appt. Instructed her to bring list of medications and History form. Patient understands and will also bring in her current medication that Was prescribed form previous pain doctor. States it is making her itching and that MD instructed to keep taking. Told her to bring and that we could waste them.

## 2019-10-26 ENCOUNTER — Encounter: Payer: Self-pay | Admitting: Internal Medicine

## 2019-10-26 ENCOUNTER — Ambulatory Visit (INDEPENDENT_AMBULATORY_CARE_PROVIDER_SITE_OTHER): Payer: 59 | Admitting: Internal Medicine

## 2019-10-26 ENCOUNTER — Encounter: Payer: Self-pay | Admitting: Student in an Organized Health Care Education/Training Program

## 2019-10-26 ENCOUNTER — Ambulatory Visit (HOSPITAL_BASED_OUTPATIENT_CLINIC_OR_DEPARTMENT_OTHER): Payer: 59 | Admitting: Student in an Organized Health Care Education/Training Program

## 2019-10-26 ENCOUNTER — Other Ambulatory Visit: Payer: Self-pay

## 2019-10-26 ENCOUNTER — Ambulatory Visit
Admission: RE | Admit: 2019-10-26 | Discharge: 2019-10-26 | Disposition: A | Discharge status: Home / Self Care | Payer: 59 | Source: Ambulatory Visit | Attending: Student in an Organized Health Care Education/Training Program | Admitting: Student in an Organized Health Care Education/Training Program

## 2019-10-26 VITALS — BP 144/87 | HR 89 | Temp 97.2°F | Resp 16 | Ht 65.0 in | Wt 151.4 lb

## 2019-10-26 VITALS — BP 119/69 | HR 96 | Temp 98.6°F | Resp 16 | Ht 65.0 in | Wt 151.8 lb

## 2019-10-26 DIAGNOSIS — M5416 Radiculopathy, lumbar region: Secondary | ICD-10-CM | POA: Diagnosis present

## 2019-10-26 DIAGNOSIS — G894 Chronic pain syndrome: Secondary | ICD-10-CM

## 2019-10-26 DIAGNOSIS — M792 Neuralgia and neuritis, unspecified: Secondary | ICD-10-CM | POA: Diagnosis present

## 2019-10-26 DIAGNOSIS — G8929 Other chronic pain: Secondary | ICD-10-CM | POA: Diagnosis not present

## 2019-10-26 DIAGNOSIS — R0602 Shortness of breath: Secondary | ICD-10-CM

## 2019-10-26 DIAGNOSIS — Z9981 Dependence on supplemental oxygen: Secondary | ICD-10-CM | POA: Diagnosis not present

## 2019-10-26 DIAGNOSIS — J449 Chronic obstructive pulmonary disease, unspecified: Secondary | ICD-10-CM | POA: Diagnosis not present

## 2019-10-26 DIAGNOSIS — J4520 Mild intermittent asthma, uncomplicated: Secondary | ICD-10-CM | POA: Diagnosis not present

## 2019-10-26 DIAGNOSIS — G4734 Idiopathic sleep related nonobstructive alveolar hypoventilation: Secondary | ICD-10-CM

## 2019-10-26 MED ORDER — ALBUTEROL SULFATE HFA 108 (90 BASE) MCG/ACT IN AERS: 2.0000 | INHALATION_SPRAY | Freq: Four times a day (QID) | RESPIRATORY_TRACT | 2 refills | Status: DC | PRN

## 2019-10-26 MED ORDER — BUDESONIDE-FORMOTEROL FUMARATE 80-4.5 MCG/ACT IN AERO: 2.0000 | INHALATION_SPRAY | Freq: Two times a day (BID) | RESPIRATORY_TRACT | 3 refills | Status: DC

## 2019-10-26 NOTE — Progress Notes (Signed)
Safety precautions to be maintained throughout the outpatient stay will include: orient to surroundings, keep bed in low position, maintain call bell within reach at all times, provide assistance with transfer out of bed and ambulation.  

## 2019-10-26 NOTE — Progress Notes (Signed)
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 L5-S1.   Electronically Signed By: Macy Mis M.D. On: 06/04/2019 18:16  Lumbar CT w/wo contrast: No results found for this or any previous visit.  Lumbar CT w/wo contrast: No results found for this or any previous visit.  Lumbar CT w contrast: Results for orders placed in visit on 12/30/02  CT Lumbar Spine W Contrast  Narrative FINDINGS CLINICAL DATA:   NECK AND LEFT ARM PAIN.  LOW BACK PAIN. CERVICAL MYELOGRAM FOLLOWING INFORMED CONSENT, STERILE PREPARATION OF THE BACK, AND ADEQUATE LOCAL ANESTHESIA, A LUMBAR PUNCTURE WAS PERFORMED FROM L3-4 FROM A LEFT PARAMEDIAN APPROACH.  FLUID WAS CLEAR AND COLORLESS.  9 CC OF OMNIPAQUE 300 WAS INSTILLED INTO THE SUBARACHNOID SPACE AND MANEUVERED INTO THE CERVICAL REGION.  A FORMAL LUMBAR MYELOGRAM  WAS NOT PERFORMED,  BUT A POST MYELOGRAM CT OF THE LUMBAR SPINE WAS OBTAINED. AP, LATERAL, AND OBLIQUE VIEWS OF THE CERVICAL SPINE REVEAL SMALL VENTRAL DEFECTS AT C4-5 AND C5-6. THERE IS MINIMAL TRUNCATION OF THE LEFT C6 ROOT. POST MYELOGRAM CT OF THE CERVICAL SPINE THE LOWER DISK SPACES WERE EXAMINED AS FOLLOWS: C2-3:   NORMAL INTERSPACE. C3-4:  NORMAL INTERSPACE. C4-5:  MILD ANNULAR BULGE.  MILD OSTEOPHYTE FORMATION.  NO ROOT CUT OFF. C5-6:  ASYMMETRIC UNCINATE HYPERTROPHY, LEFT.  THERE MAY BE A SMALL SUPERIMPOSED SOFT PROTRUSION. LEFT C6 NERVE ROOT ENCROACHMENT IS PRESENT. C6-7:  NORMAL INTERSPACE. C7-T1: NORMAL INTERSPACE. IMPRESSION 1)   ASYMMETRIC UNCINATE SPUR C5-6, LEFT WITH POSSIBLE SUPERIMPOSED SOFT PROTRUSION; THERE IS MILD LEFT C6 NERVE ROOT EFFACEMENT. 2)  MILD VENTRAL BULGE AND SPUR C4-5 WITHOUT NEURAL ENCROACHMENT. CT MULTIPLANAR RECONSTRUCTION MULTIPLANAR REFORMATTED CT IMAGES WERE RECONSTRUCTED FROM THE AXIAL CT DATA SET.  THESE IMAGES WERE REVIEWED, AND PERTINENT FINDINGS ARE INCLUDED IN THE ACCOMPANYING COMPLETE CT REPORT. IMPRESSION SEE COMPLETE CT REPORT. POST MYELOGRAM CT OF THE LUMBAR SPINE THE LOWER FIVE DISK SPACES WERE EXAMINED: L1-2:   NORMAL INTERSPACE. L2-3:  NORMAL INTERSPACE. L3-4:   NORMAL INTERSPACE. L4-5:  MILD FACET ARTHROPATHY.  NO STENOSIS OR DISK PROTRUSION. L5-S1:  MILD FACET ARTHROPATHY.  NO  STENOSIS OR DISK PROTRUSION. IMPRESSION MILD LOWER LUMBAR FACET ARTHROPATHY; NO STENOSIS OR DISK PROTRUSION. CT MULTIPLANAR RECONSTRUCTION MULTIPLANAR REFORMATTED CT IMAGES WERE RECONSTRUCTED FROM THE AXIAL CT DATA SET.  THESE IMAGES WERE REVIEWED, AND PERTINENT FINDINGS ARE INCLUDED IN THE ACCOMPANYING COMPLETE CT REPORT. IMPRESSION SEE COMPLETE CT REPORT.   Narrative INDICATION: 64 year old female with a history of microscopic hematuria  EXAM: CT BIOPSY  MEDICATIONS: None.  ANESTHESIA/SEDATION: Moderate (conscious) sedation was employed during this procedure.  A total of Versed 2.0 mg and Fentanyl 100 mcg was administered intravenously.  Moderate Sedation Time: 15 minutes. The patient's level of consciousness and vital signs were monitored continuously by radiology nursing throughout the procedure under my direct supervision.  FLUOROSCOPY TIME:  CT  COMPLICATIONS: None  PROCEDURE: Informed written consent was obtained from the patient after a thorough discussion of the procedural risks, benefits and alternatives. All questions were addressed. Maximal Sterile Barrier Technique was utilized including caps, mask, sterile gowns, sterile gloves, sterile drape, hand hygiene and skin antiseptic. A timeout was performed prior to the initiation of the procedure.  Patient position prone position on CT gantry table. Scout CT was acquired for planning purposes.  The patient was then prepped and draped in the usual sterile fashion. 1% lidocaine was used for local anesthesia. Using CT guidance, 17 gauge trocar was advanced into the posterolateral cortex of the left kidney. Multiple 18 gauge core biopsy were acquired. Specimen placed into fresh specimen solution.  Gel-Foam slurry was administered and the needle was removed. Final CT was acquired.  Sterile bandage was placed.  Patient tolerated the procedure well and remained hemodynamically stable throughout.  No complications were encountered and no significant blood loss.  IMPRESSION: Status post CT-guided biopsy for medical renal disease. Tissue specimen sent to pathology for complete histopathologic analysis.  Signed,  Dulcy Fanny. Dellia Nims, RPVI  [facility]  [facility]   Electronically Signed By: Corrie Mckusick D.O. On: 12/31/2018 15:46    Narrative CLINICAL DATA:  Right hip pain for 1 week.  No specific injury.  EXAM: DG HIP (WITH OR WITHOUT PELVIS) 2-3V RIGHT  COMPARISON:  None.  FINDINGS: There is no evidence of hip  fracture or dislocation. There is no evidence of arthropathy or other focal bone abnormality.  IMPRESSION: No acute osseous injury of the right hip.   Electronically Signed By: Kathreen Devoid On: 02/19/2019 15:26   Narrative CLINICAL DATA:  Chronic knee pain for 2 years worse this year, pain all around knee joint anteriorly, spasms  EXAM: RIGHT KNEE - COMPLETE 4+ VIEW  COMPARISON:  None  FINDINGS: Mild osseous demineralization.  Joint spaces preserved.  No acute fracture, dislocation, or bone destruction.  Small lateral patellar spur.  No knee joint effusion.  IMPRESSION: Minimal degenerative changes RIGHT patellofemoral joint.  No acute abnormalities.   Electronically Signed By: Lavonia Dana M.D. On: 10/03/2017 16:21  Narrative CLINICAL DATA:  Chronic knee pain for 2 years worse this year, pain all around knee joint anteriorly with BILATERAL spasms  EXAM: LEFT KNEE - COMPLETE 4+ VIEW  COMPARISON:  12/10/2011  FINDINGS: Osseous demineralization.  Medial compartment joint space narrowing.  No acute fracture, dislocation, or bone destruction.  Small lateral patellar spur.  No knee joint effusion.  IMPRESSION: Mild degenerative changes LEFT knee.  No acute abnormalities.   Electronically Signed By: Lavonia Dana M.D. On: 10/03/2017 16:22    Complexity Note: Imaging results reviewed. Results shared with Ms. Nemmers, using Layman's terms.  ROS  Cardiovascular: Daily Aspirin intake, High blood pressure and Heart murmur Pulmonary or Respiratory: Lung problems, Wheezing and difficulty taking a deep full breath (Asthma), Shortness of breath and Snoring  Neurological: Stroke (Residual deficits or weakness: none) and Curved spine Psychological-Psychiatric: Difficulty sleeping and or falling asleep and No reported psychological or psychiatric signs or symptoms such as difficulty sleeping, anxiety, depression, delusions or  hallucinations (schizophrenial), mood swings (bipolar disorders) or suicidal ideations or attempts Gastrointestinal: Reflux or heatburn and Irregular, infrequent bowel movements (Constipation) Genitourinary: Difficulty producing urine (Renal failure), Passing kidney stones and No reported renal or genitourinary signs or symptoms such as difficulty voiding or producing urine, peeing blood, non-functioning kidney, kidney stones, difficulty emptying the bladder, difficulty controlling the flow of urine, or chronic kidney disease Hematological: Weakness due to low blood hemoglobin or red blood cell count (Anemia) and No reported hematological signs or symptoms such as prolonged bleeding, low or poor functioning platelets, bruising or bleeding easily, hereditary bleeding problems, low energy levels due to low hemoglobin or being anemic Endocrine: High blood sugar controlled without the use of insulin (NIDDM) and No reported endocrine signs or symptoms such as high or low blood sugar, rapid heart rate due to high thyroid levels, obesity or weight gain due to slow thyroid or thyroid disease Rheumatologic: No reported rheumatological signs and symptoms such as fatigue, joint pain, tenderness, swelling, redness, heat, stiffness, decreased range of motion, with or without associated rash Musculoskeletal: Negative for myasthenia gravis, muscular dystrophy, multiple sclerosis or malignant hyperthermia Work History: Retired  Allergies  Ms. Yordy is allergic to grapefruit bioflavonoid complex, grapefruit extract, propoxyphene, and prednisolone.  Laboratory Chemistry Profile   Renal Lab Results  Component Value Date   BUN 9 06/18/2019   CREATININE 1.18 (H) 06/18/2019   LABCREA 157 12/28/2018   BCR 11 (L) 09/29/2017   GFRAA 58 (L) 06/18/2019   GFRNONAA 50 (L) 06/18/2019   SPECGRAV 1.020 12/02/2018   PHUR 7.0 12/02/2018   PROTEINUR NEGATIVE 02/18/2019     Electrolytes Lab Results  Component Value Date    NA 144 06/18/2019   K 3.6 06/18/2019   CL 107 06/18/2019   CALCIUM 9.2 06/18/2019   MG 2.0 02/20/2019   PHOS 3.9 01/01/2019     Hepatic Lab Results  Component Value Date   AST 21 06/04/2019   ALT 11 06/04/2019   ALBUMIN 3.9 06/04/2019   ALKPHOS 51 06/04/2019   LIPASE 25 06/04/2019     ID Lab Results  Component Value Date   HIV NON REACTIVE 02/18/2019   [place] NEGATIVE 02/18/2019     Bone No results found for: VD25OH, HD622WL7LGX, QJ1941DE0, CX4481EH6, 25OHVITD1, 25OHVITD2, 25OHVITD3, TESTOFREE, TESTOSTERONE   Endocrine Lab Results  Component Value Date   GLUCOSE 79 06/18/2019   GLUCOSEU NEGATIVE 02/18/2019   HGBA1C 5.5 02/19/2019   TSH 0.580 02/19/2019     Neuropathy Lab Results  Component Value Date   VITAMINB12 388 02/20/2019   HGBA1C 5.5 02/19/2019   HIV NON REACTIVE 02/18/2019     CNS No results found for: COLORCSF, APPEARCSF, RBCCOUNTCSF, WBCCSF, POLYSCSF, LYMPHSCSF, EOSCSF, PROTEINCSF, GLUCCSF, JCVIRUS, CSFOLI, IGGCSF, LABACHR, ACETBL, LABACHR, ACETBL   Inflammation (CRP: Acute  ESR: Chronic) No results found for: CRP, ESRSEDRATE, LATICACIDVEN   Rheumatology Lab Results  Component Value Date   ANA Negative 11/20/2018     Coagulation Lab Results  Component Value Date   INR 0.9 12/28/2018   LABPROT 12.4 12/28/2018   PLT 314 06/18/2019  Cardiovascular Lab Results  Component Value Date   CKTOTAL 61 07/22/2013   CKMB < 0.5 (L) 07/22/2013   TROPONINI <0.03 12/29/2017   HGB 10.6 (L) 06/18/2019   HCT 31.1 (L) 06/18/2019     Screening Lab Results  Component Value Date   SARSCOV2NAA NEGATIVE 02/18/2019   HIV NON REACTIVE 02/18/2019     Cancer No results found for: CEA, CA125, LABCA2   Allergens No results found for: ALMOND, APPLE, ASPARAGUS, AVOCADO, BANANA, BARLEY, BASIL, BAYLEAF, GREENBEAN, LIMABEAN, WHITEBEAN, BEEFIGE, REDBEET, BLUEBERRY, BROCCOLI, CABBAGE, MELON, CARROT, CASEIN, CASHEWNUT, CAULIFLOWER, CELERY     Note: Lab  results reviewed.  PFSH  Drug: Ms. Williamson  reports no history of drug use. Alcohol:  reports no history of alcohol use. Tobacco:  reports that she has never smoked. She has never used smokeless tobacco. Medical:  has a past medical history of Acid reflux, Anxiety, Arthritis, Bronchitis (09/2015), Gross hematuria, H. pylori infection, Heart murmur, Kidney failure, Labile essential hypertension (01/06/2018), Lung abnormality, Neck pain, Nephrolithiasis, Pneumonia, Pneumothorax, Scoliosis, Urinary frequency, and Vaginal atrophy. Family: family history includes Arthritis in her mother; Asthma in her mother; Cancer in her maternal aunt; Diabetes in her sister; Hyperlipidemia in her mother; Hypertension in her mother.  Past Surgical History:  Procedure Laterality Date  . ABDOMINAL HYSTERECTOMY     vaginal  . APPENDECTOMY    . COLONOSCOPY WITH PROPOFOL N/A 12/29/2018   Procedure: COLONOSCOPY WITH PROPOFOL;  Surgeon: Lucilla Lame, MD;  Location: [facility] ENDOSCOPY;  Service: Endoscopy;  Laterality: N/A;  . FOOT SURGERY Left    10/2015  . NECK SURGERY     Active Ambulatory Problems    Diagnosis Date Noted  . DDD (degenerative disc disease), lumbar 08/15/2014  . Lumbar radiculopathy 08/15/2014  . 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 DDD (degenerative disc disease), cervical 08/15/2014  . Bilateral occipital neuralgia 08/15/2014  . Migraine 08/15/2014  . Sacroiliac joint dysfunction 08/15/2014  . Absolute anemia 08/15/2013  . Back pain, chronic 09/23/2013  . Chronic cervical pain 09/23/2013  . Chronic kidney disease (CKD), stage III (moderate) 08/15/2013  . Abnormal serum level of alkaline phosphatase 08/15/2013  . Gastrointestinal ulcer due to Helicobacter pylori 03/50/0938  . Blush 08/15/2013  . Hematuria, microscopic 08/15/2013  . Inflamed nasal mucosa 08/15/2013  . PNA (pneumonia) 12/28/2014  . Kidney failure 08/09/2013  . SIRS (systemic inflammatory  response syndrome) ([place]) 08/09/2013  . Big thyroid 08/15/2013  . Arthralgia of temporomandibular joint 08/29/2013  . HAV (hallux abducto valgus) 07/02/2015  . Overlapping toe 07/02/2015  . Hammertoe 07/02/2015  . Other specified bacterial intestinal infections 08/14/2013  . LBP (low back pain) 01/17/2015  . Systemic inflammatory response syndrome (SIRS) ([place]) 08/09/2013  . Disease of thyroid gland 08/15/2013  . Trochanteric bursitis of right hip 01/17/2015  . History of hematuria 07/12/2015  . Urinary frequency 07/12/2015  . Vaginal atrophy 07/12/2015  . B12 deficiency 10/31/2015  . Chronic pain syndrome 04/06/2017  . Overweight (BMI 25.0-29.9) 04/08/2017  . Pain in the groin, right 10/28/2016  . Paresthesia of both hands 12/14/2015  . Positive H. pylori test 08/15/2013  . Sensory disturbance 01/08/2017  . SOB (shortness of breath) 08/29/2015  . Intractable back pain 06/05/2017  . Compression fracture of thoracic vertebra, sequela 07/12/2017  . Lumbar compression fracture, sequela 07/12/2017  . Nocturnal hypoxia 07/12/2017  . Simple chronic bronchitis ([place]) 07/12/2017  . Age-related osteoporosis without current pathological fracture 03/31/2018  . 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  False positive ana 03/31/2018  . Essential hypertension 01/06/2018  . Lacunar infarction ([place]) 01/06/2018  . Mouth dryness 02/18/2018  . Screening for osteoporosis 02/18/2018  . Bunion 05/06/2018  . Metatarsalgia of right foot 05/06/2018  . Encounter for screening colonoscopy   . Dizziness 02/18/2019  . Multiple falls 02/20/2019  . Myoclonic jerking 02/20/2019  . COPD (chronic obstructive pulmonary disease) ([place]) 02/20/2019  . Seasonal allergies 02/20/2019  . Insomnia 02/20/2019  . Depression with anxiety 02/20/2019  . AKI (acute kidney injury) ([place]) 02/20/2019  . Acute nonintractable headache    Resolved Ambulatory Problems    Diagnosis Date Noted  . No Resolved  Ambulatory Problems   Past Medical History:  Diagnosis Date  . Acid reflux   . Anxiety   . Arthritis   . Bronchitis 09/2015  . Gross hematuria   . H. pylori infection   . Heart murmur   . 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 Lung abnormality   . Neck pain   . Nephrolithiasis   . Pneumonia   . Pneumothorax   . Scoliosis    Constitutional Exam  General appearance: Well nourished, well developed, and well hydrated. In no apparent acute distress Vitals:   10/26/19 0955  BP: (!) 144/87  Pulse: 89  Resp: 16  Temp: (!) 97.2 F (36.2 C)  TempSrc: Temporal  SpO2: 100%  Weight: 151 lb 6.4 oz (68.7 kg)  Height: 5' 5"  (1.651 m)   BMI Assessment: Estimated body mass index is 25.19 kg/m as calculated from the following:   Height as of this encounter: 5' 5"  (1.651 m).   Weight as of this encounter: 151 lb 6.4 oz (68.7 kg).  BMI interpretation table: BMI level Category Range association with higher incidence of chronic pain  <18 kg/m2 Underweight   18.5-24.9 kg/m2 Ideal body weight   25-29.9 kg/m2 Overweight Increased incidence by 20%  30-34.9 kg/m2 Obese (Class I) Increased incidence by 68%  35-39.9 kg/m2 Severe obesity (Class II) Increased incidence by 136%  >40 kg/m2 Extreme obesity (Class III) Increased incidence by 254%   Patient's current BMI Ideal Body weight  Body mass index is 25.19 kg/m. Ideal body weight: 57 kg (125 lb 10.6 oz) Adjusted ideal body weight: 61.7 kg (135 lb 15.3 oz)   BMI Readings from Last 4 Encounters:  10/26/19 25.19 kg/m  06/18/19 25.79 kg/m  06/04/19 25.40 kg/m  04/08/19 26.29 kg/m   Wt Readings from Last 4 Encounters:  10/26/19 151 lb 6.4 oz (68.7 kg)  06/18/19 154 lb 15.7 oz (70.3 kg)  06/04/19 155 lb (70.3 kg)  04/08/19 158 lb (71.7 kg)    Psych/Mental status: Alert, oriented x 3 (person, place, & time)       Eyes: PERLA Respiratory: No evidence of acute respiratory distress  Cervical Spine Exam  Skin & Axial  Inspection: No masses, redness, edema, swelling, or associated skin lesions Alignment: Symmetrical Functional ROM: Unrestricted ROM      Stability: No instability detected Muscle Tone/Strength: Functionally intact. No obvious neuro-muscular anomalies detected. Sensory (Neurological): Unimpaired Palpation: No palpable anomalies              Upper Extremity (UE) Exam    Side: Right upper extremity  Side: Left upper extremity  Skin & Extremity Inspection: Skin color, temperature, and hair growth are WNL. No peripheral edema or cyanosis. No masses, redness, swelling, asymmetry, or associated skin lesions. No contractures.  Skin & Extremity Inspection: Skin color, temperature, and hair growth are WNL. No peripheral  edema or cyanosis. No masses, redness, swelling, asymmetry, or associated skin lesions. No contractures.  Functional ROM: Unrestricted ROM          Functional ROM: Unrestricted ROM          Muscle Tone/Strength: Functionally intact. No obvious neuro-muscular anomalies detected.   Muscle Tone/Strength: Functionally intact. No obvious neuro-muscular anomalies detected.  Sensory (Neurological): Unimpaired          Sensory (Neurological): Unimpaired          Palpation: No palpable anomalies              Palpation: No palpable anomalies              Provocative Test(s):  Phalen's test: deferred Tinel's test: deferred Apley's scratch test (touch opposite shoulder):  Action 1 (Across chest): deferred Action 2 (Overhead): deferred Action 3 (LB reach): deferred   Provocative Test(s):  Phalen's test: deferred Tinel's test: deferred Apley's scratch test (touch opposite shoulder):  Action 1 (Across chest): deferred Action 2 (Overhead): deferred Action 3 (LB reach): deferred    Thoracic Spine Area Exam  Skin & Axial Inspection: No masses, redness, or swelling Alignment: Symmetrical Functional ROM: Unrestricted ROM Stability: No instability detected Muscle Tone/Strength: Functionally  intact. No obvious neuro-muscular anomalies detected. Sensory (Neurological): Unimpaired Muscle strength & Tone: No palpable anomalies  Lumbar Exam  Skin & Axial Inspection: No masses, redness, or swelling Alignment: Symmetrical Functional ROM: Pain restricted ROM affecting both sides Stability: No instability detected Muscle Tone/Strength: Functionally intact. No obvious neuro-muscular anomalies detected. Sensory (Neurological): Neurogenic pain pattern  Provocative Tests: Hyperextension/rotation test: (+) due to pain. Lumbar quadrant test (Kemp's test): (+) due to pain. Lateral bending test: deferred today       Patrick's Maneuver: deferred today                   FABER* test: deferred today                   S-I anterior distraction/compression test: deferred today         S-I lateral compression test: deferred today         S-I Thigh-thrust test: deferred today         S-I Gaenslen's test: deferred today         *(Flexion, ABduction and External Rotation)  Gait & Posture Assessment  Ambulation: Limited Gait: Antalgic gait (limping) Posture: Difficulty standing up straight, due to pain   Lower Extremity Exam    Side: Right lower extremity  Side: Left lower extremity  Stability: No instability observed          Stability: No instability observed          Skin & Extremity Inspection: Skin color, temperature, and hair growth are WNL. No peripheral edema or cyanosis. No masses, redness, swelling, asymmetry, or associated skin lesions. No contractures.  Skin & Extremity Inspection: Skin color, temperature, and hair growth are WNL. No peripheral edema or cyanosis. No masses, redness, swelling, asymmetry, or associated skin lesions. No contractures.  Functional ROM: Pain restricted ROM for hip and knee joints          Functional ROM: Pain restricted ROM for hip and knee joints          Muscle Tone/Strength: Functionally intact. No obvious neuro-muscular anomalies detected.  Muscle  Tone/Strength: Functionally intact. No obvious neuro-muscular anomalies detected.  Sensory (Neurological): Neurogenic pain pattern        Sensory (Neurological): Neurogenic pain pattern  DTR: Patellar: deferred today Achilles: deferred today Plantar: deferred today  DTR: Patellar: deferred today Achilles: deferred today Plantar: deferred today  Palpation: No palpable anomalies  Palpation: No palpable anomalies   Assessment  Primary Diagnosis & Pertinent Problem List: The primary encounter diagnosis was Chronic radicular lumbar pain. Diagnoses of Lumbar radiculopathy, Chronic pain syndrome, Neuropathic pain of left lower extremity, and Neuropathic pain of right lower extremity were also pertinent to this visit.  Visit Diagnosis (New problems to examiner): 1. Chronic radicular lumbar pain   2. Lumbar radiculopathy   3. Chronic pain syndrome   4. Neuropathic pain of left lower extremity   5. Neuropathic pain of right lower extremity    Plan of Care (Initial workup plan)   I discussed  percutaneous spinal cord stimulator trial with the patient in detail especially since she has failed conservative therapy with medication management both opioid and nonopioid analgesics along with epidural steroid injections.  Patient has also tried physical therapy without benefit.  In regards to spinal cord stimulator trial, I explained to the patient that they will have an external power source and programmer which the patient will use for 7 days. There will be daily communication with the stimulator company and the patient. A possible need for a mid-trial clinic visit to give the patient the best chance of success.   I was able to evaluate the patient's interlaminar windows under live fluoroscopy today and they seem patent for a safe percutaneous spinal cord stimulator trial.  Patient has had a thorough psychosocial behavioral evaluation with Dr Lyman Speller and has been cleared for spinal cord stimulator  trial/implant.  Some of patient's pain does seem to be mechanical in nature, with some component of neurogenic pain as well. We discussed the indications for spinal cord stimulation, specifically stating that it is typically better for neuropathic and appendicular pain, but that we have had some success in the treatment of low back and hip pain.   Patient is interested in proceeding with spinal cord stimulation trial. He understands that this may not be successful, and that spinal cord stimulation in general is not a "magic bullet."   We had a lengthy and very detailed discussion of all the risks, benefits, alternatives, and rationale of surgery as well as the option of continuing nonsurgical therapies. We specifically discussed the risks of temporary or permanent worsened neurologic injury, no symptomatic relief or pain made worse after procedure, and also the need for future surgery (due to infection, CSF leak, bleeding, adjacent segment issues, bone-healing difficulties, and other related issues). No guarantees of outcome were made or implied and he is eager to proceed and presents for definitive treatment.    Patient told me that all of her questions were answered thoroughly and to his satisfaction. Confidence and understanding of the discussed risks and consequences of  treatment was expressed and she accepted these risks and was eager to proceed with procedure.   Issues concerning treatment and diagnosis were discussed with the patient. There are no barriers to understanding the plan of treatment. Explanation was well received by patient and/or family who then verbalized understanding.    Imaging Orders     DG PAIN CLINIC C-ARM 1-60 MIN NO REPORT  Procedure Orders     [place] TRIAL    Provider-requested follow-up: Return in about 4 weeks (around 11/23/2019) for [place] SCS trial (2 hrs).  Future Appointments  Date Time Provider [place]  10/26/2019  4:00 PM Allyne Gee, MD [facility]  None  12/02/2019  9:00 AM McGowan, Hunt Oris, PA-C [facility] None    Note by: Gillis Santa, MD Date: 10/26/2019; Time: 12:51 PM

## 2019-10-26 NOTE — Progress Notes (Signed)
[facility] [place], [place] [address]  Pulmonary Sleep Medicine   Office Visit Note  Patient Name: Rachael Jordan DOB: [birth year] MRN [id number]  Date of Service: 10/26/2019  Complaints/HPI: Pt is here for pulm follow up. She has a history of copd, asthma, and nocturnal hypoxia.  She currently has oxygen at 3 lpm nightly.  She overall is doing well. She uses symbicort and albuterol as needed.    ROS  General: (-) fever, (-) chills, (-) night sweats, (-) weakness Skin: (-) rashes, (-) itching,. Eyes: (-) visual changes, (-) redness, (-) itching. Nose and Sinuses: (-) nasal stuffiness or itchiness, (-) postnasal drip, (-) nosebleeds, (-) sinus trouble. Mouth and Throat: (-) sore throat, (-) hoarseness. Neck: (-) swollen glands, (-) enlarged thyroid, (-) neck pain. Respiratory: - cough, (-) bloody sputum, - shortness of breath, - wheezing. Cardiovascular: - ankle swelling, (-) chest pain. Lymphatic: (-) lymph node enlargement. Neurologic: (-) numbness, (-) tingling. Psychiatric: (-) anxiety, (-) depression   Current Medication: Outpatient Encounter Medications as of 10/26/2019  Medication Sig Note  . albuterol (PROVENTIL) (2.5 MG/3ML) 0.083% nebulizer solution Take 2.5 mg by nebulization every 6 (six) hours as needed for wheezing or shortness of breath.   Marland Kitchen albuterol (VENTOLIN HFA) 108 (90 Base) MCG/ACT inhaler Inhale 2 puffs into the lungs every 6 (six) hours as needed for wheezing or shortness of breath.   Marland Kitchen amLODipine (NORVASC) 5 MG tablet Take 5 mg by mouth daily.   Marland Kitchen aspirin EC 81 MG tablet Take 81 mg by mouth daily.   Marland Kitchen azelastine (ASTELIN) 0.1 % nasal spray Place 1-2 sprays into both nostrils 2 (two) times daily.    . Biotin 10000 MCG TABS Take 10 mg by mouth daily.    . budesonide-formoterol (SYMBICORT) 80-4.5 MCG/ACT inhaler Inhale 2 puffs into the lungs 2 (two) times daily. 02/18/2019: Patient reports taking but no confirming pharmacy record   . cetirizine (ZYRTEC) 10 MG tablet Take 10 mg by mouth at bedtime.    . Cholecalciferol (VITAMIN D-1000 MAX ST) 25 MCG (1000 UT) tablet Take 1,000 Units by mouth daily.    . diclofenac Sodium (VOLTAREN) 1 % GEL Apply topically 4 (four) times daily.   . montelukast (SINGULAIR) 10 MG tablet Take 10 mg by mouth at bedtime.    . Multiple Vitamins-Minerals (CENTRUM ADULTS PO) Take 1 tablet by mouth daily.    . nortriptyline (PAMELOR) 10 MG capsule Take 20 mg by mouth at bedtime.    Marland Kitchen olmesartan (BENICAR) 20 MG tablet Take 40 mg by mouth daily.    . pantoprazole (PROTONIX) 40 MG tablet Take 40 mg by mouth daily.   Marland Kitchen pyridOXINE (VITAMIN B-6) 100 MG tablet Take 100 mg by mouth daily.   . rizatriptan (MAXALT) 10 MG tablet Take 10 mg by mouth as needed for migraine. May repeat in 2 hours if needed   . tiZANidine (ZANAFLEX) 4 MG tablet Take 4 mg by mouth 4 (four) times daily as needed for muscle spasms.    . traZODone (DESYREL) 100 MG tablet Take 200 mg by mouth at bedtime.    . valACYclovir (VALTREX) 1000 MG tablet Take 1,000 mg by mouth daily.    . vitamin C (ASCORBIC ACID) 500 MG tablet Take 500 mg by mouth daily.   . Vitamin E 45 MG CAPS Take 90 mg by mouth daily.   Marland Kitchen venlafaxine XR (EFFEXOR-XR) 75 MG 24 hr capsule Take 37.5 mg by mouth daily.     No  facility-administered encounter medications on file as of 10/26/2019.    Surgical History: Past Surgical History:  Procedure Laterality Date  . ABDOMINAL HYSTERECTOMY     vaginal  . APPENDECTOMY    . COLONOSCOPY WITH PROPOFOL N/A 12/29/2018   Procedure: COLONOSCOPY WITH PROPOFOL;  Surgeon: Lucilla Lame, MD;  [facility] END[facility]COPY;  Service: Endoscopy;  Laterality: N/A;  . FOOT SURGERY Left    10/2015  . NECK SURGERY      Medical History: Past Medical History:  Diagnosis Date  . Acid reflux   . Anxiety   . Arthritis   . Bronchitis 09/2015  . Gross hematuria   . H. pylori infection   . Heart murmur   . Kidney failure   . Labile  essential hypertension 01/06/2018  . Lung abnormality    "damaged lung due to pneumonia"  . Neck pain   . Nephrolithiasis   . Pneumonia   . Pneumothorax   . Scoliosis   . Urinary frequency   . Vaginal atrophy     Family History: Family History  Problem Relation Age of Onset  . Arthritis Mother   . Asthma Mother   . Hyperlipidemia Mother   . Hypertension Mother   . Diabetes Sister   . Cancer Maternal Aunt        esophagus  . Breast cancer Neg Hx   . Kidney cancer Neg Hx   . Bladder Cancer Neg Hx     Social History: Social History   Socioeconomic History  . Marital status: Legally Separated    Spouse name: Not on file  . Number of children: Not on file  . Years of education: Not on file  . Highest education level: Not on file  Occupational History  . Not on file  Tobacco Use  . Smoking status: Never Smoker  . Smokeless tobacco: Never Used  Vaping Use  . Vaping Use: Never used  Substance and Sexual Activity  . Alcohol use: No    Alcohol/week: 0.0 standard drinks  . Drug use: No  . Sexual activity: Not on file  Other Topics Concern  . Not on file  Social History Narrative  . Not on file   Social Determinants of Health   Financial Resource Strain:   . Difficulty of Paying Living Expenses:   Food Insecurity:   . Worried About Charity fundraiser in the Last Year:   . Arboriculturist in the Last Year:   Transportation Needs:   . Film/video editor (Medical):   Marland Kitchen Lack of Transportation (Non-Medical):   Physical Activity:   . Days of Exercise per Week:   . Minutes of Exercise per Session:   Stress:   . Feeling of Stress :   Social Connections:   . Frequency of Communication with Friends and Family:   . Frequency of Social Gatherings with Friends and Family:   . Attends Religious Services:   . Active Member of Clubs or Organizations:   . Attends Archivist Meetings:   Marland Kitchen Marital Status:   Intimate Partner Violence:   . Fear of Current or  Ex-Partner:   . Emotionally Abused:   Marland Kitchen Physically Abused:   . Sexually Abused:     Vital Signs: Blood pressure 119/69, pulse 96, temperature 98.6 F (37 C), resp. rate 16, height 5' 5"  (1.651 m), weight 151 lb 12.8 oz (68.9 kg), SpO2 98 %.  Examination: General Appearance: The patient is well-developed, well-nourished, and in no distress.  Skin: Gross inspection of skin unremarkable. Head: normocephalic, no gross deformities. Eyes: no gross deformities noted. ENT: ears appear grossly normal no exudates. Neck: Supple. No thyromegaly. No LAD. Respiratory: clear bilaterally. Cardiovascular: Normal S1 and S2 without murmur or rub. Extremities: No cyanosis. pulses are equal. Neurologic: Alert and oriented. No involuntary movements.  LABS: No results found for this or any previous visit (from the past 2160 hour(s)).  Radiology: DG PAIN CLINIC C-ARM 1-60 MIN NO REPORT  Result Date: 10/26/2019 Fluoro was used, but no Radiologist interpretation will be provided. Please refer to "NOTES" tab for provider progress note.   DG PAIN CLINIC C-ARM 1-60 MIN NO REPORT  Result Date: 10/26/2019 Fluoro was used, but no Radiologist interpretation will be provided. Please refer to "NOTES" tab for provider progress note.   DG PAIN CLINIC C-ARM 1-60 MIN NO REPORT  Result Date: 10/26/2019 Fluoro was used, but no Radiologist interpretation will be provided. Please refer to "NOTES" tab for provider progress note.     Assessment and Plan: Patient Active Problem List   Diagnosis Date Noted  . Acute nonintractable headache   . Multiple falls 02/20/2019  . Myoclonic jerking 02/20/2019  . COPD (chronic obstructive pulmonary disease) ([place]) 02/20/2019  . Seasonal allergies 02/20/2019  . Insomnia 02/20/2019  . Depression with anxiety 02/20/2019  . AKI (acute kidney injury) ([place]) 02/20/2019  . Dizziness 02/18/2019  . Encounter for screening colonoscopy   . Bunion 05/06/2018  . Metatarsalgia of  right foot 05/06/2018  . Age-related osteoporosis without current pathological fracture 03/31/2018  . False positive ana 03/31/2018  . Mouth dryness 02/18/2018  . Screening for osteoporosis 02/18/2018  . 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 Essential hypertension 01/06/2018  . Lacunar infarction ([place]) 01/06/2018  . Compression fracture of thoracic vertebra, sequela 07/12/2017  . Lumbar compression fracture, sequela 07/12/2017  . Nocturnal hypoxia 07/12/2017  . Simple chronic bronchitis ([place]) 07/12/2017  . Intractable back pain 06/05/2017  . Overweight (BMI 25.0-29.9) 04/08/2017  . Chronic pain syndrome 04/06/2017  . Sensory disturbance 01/08/2017  . Pain in the groin, right 10/28/2016  . Paresthesia of both hands 12/14/2015  . B12 deficiency 10/31/2015  . SOB (shortness of breath) 08/29/2015  . History of hematuria 07/12/2015  . Urinary frequency 07/12/2015  . Vaginal atrophy 07/12/2015  . HAV (hallux abducto valgus) 07/02/2015  . Overlapping toe 07/02/2015  . Hammertoe 07/02/2015  . LBP (low back pain) 01/17/2015  . Trochanteric bursitis of right hip 01/17/2015  . PNA (pneumonia) 12/28/2014  . DDD (degenerative disc disease), lumbar 08/15/2014  . Lumbar radiculopathy 08/15/2014  . 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 DDD (degenerative disc disease), cervical 08/15/2014  . Bilateral occipital neuralgia 08/15/2014  . Migraine 08/15/2014  . Sacroiliac joint dysfunction 08/15/2014  . Back pain, chronic 09/23/2013  . Chronic cervical pain 09/23/2013  . Arthralgia of temporomandibular joint 08/29/2013  . Absolute anemia 08/15/2013  . Chronic kidney disease (CKD), stage III (moderate) 08/15/2013  . Abnormal serum level of alkaline phosphatase 08/15/2013  . Blush 08/15/2013  . Hematuria, microscopic 08/15/2013  . Inflamed nasal mucosa 08/15/2013  . Big thyroid 08/15/2013  . Disease of thyroid gland 08/15/2013  . Positive  H. pylori test 08/15/2013  . Gastrointestinal ulcer due to Helicobacter pylori 40/97/3532  . Other specified bacterial intestinal infections 08/14/2013  . Kidney failure 08/09/2013  . SIRS (systemic inflammatory response syndrome) ([place]) 08/09/2013  . Systemic inflammatory response syndrome (SIRS) ([place]) 08/09/2013    1. Obstructive chronic  bronchitis without exacerbation ([place]) Continue Symbicort, discussed daily use and patient verbalized understanding. Good control of symptoms at this time.   2. Mild intermittent asthma without complication Stable, continue ventolin as needed.  - albuterol (VENTOLIN HFA) 108 (90 Base) MCG/ACT inhaler; Inhale 2 puffs into the lungs every 6 (six) hours as needed for wheezing or shortness of breath.  Dispense: 18 g; Refill: 2  3. Oxygen dependent Continue to use oxygen at night as instructed.   4. Nocturnal hypoxia Continue to use oxygen at night 3 lpm.  5. Shortness of breath Arlyce Harman is slightly improved.  - Spirometry with Graph - budesonide-formoterol (SYMBICORT) 80-4.5 MCG/ACT inhaler; Inhale 2 puffs into the lungs 2 (two) times daily.  Dispense: 10.2 g; Refill: 3  General Counseling: I have discussed the findings of the evaluation and examination with Ellyanna.  I have also discussed any further diagnostic evaluation thatmay be needed or ordered today. Raseel verbalizes understanding of the findings of todays visit. We also reviewed her medications today and discussed drug interactions and side effects including but not limited excessive drowsiness and altered mental states. We also discussed that there is always a risk not just to her but also people around her. she has been encouraged to call the office with any questions or concerns that should arise related to todays visit.  Orders Placed This Encounter  Procedures  . Spirometry with Graph    Order Specific Question:   Where should this test be performed?    Answer:   Other     Time spent:  30 This patient was seen by Orson Gear AGNP-C in Collaboration with Dr. Devona Konig as a part of collaborative care agreement.   I have personally obtained a history, examined the patient, evaluated laboratory and imaging results, formulated the assessment and plan and placed orders.    Allyne Gee, MD [facility] Pulmonary and Critical Care Sleep medicine

## 2019-11-10 ENCOUNTER — Other Ambulatory Visit: Payer: Self-pay

## 2019-11-10 ENCOUNTER — Ambulatory Visit
Admission: RE | Admit: 2019-11-10 | Discharge: 2019-11-10 | Disposition: A | Discharge status: Home / Self Care | Payer: 59 | Source: Ambulatory Visit | Attending: Student in an Organized Health Care Education/Training Program | Admitting: Student in an Organized Health Care Education/Training Program

## 2019-11-10 ENCOUNTER — Ambulatory Visit (HOSPITAL_BASED_OUTPATIENT_CLINIC_OR_DEPARTMENT_OTHER): Payer: 59 | Admitting: Student in an Organized Health Care Education/Training Program

## 2019-11-10 VITALS — BP 117/75 | HR 96 | Temp 98.2°F | Resp 15 | Ht 65.0 in | Wt 153.0 lb

## 2019-11-10 DIAGNOSIS — G894 Chronic pain syndrome: Secondary | ICD-10-CM | POA: Diagnosis not present

## 2019-11-10 DIAGNOSIS — M5416 Radiculopathy, lumbar region: Secondary | ICD-10-CM

## 2019-11-10 DIAGNOSIS — G8929 Other chronic pain: Secondary | ICD-10-CM | POA: Diagnosis present

## 2019-11-10 DIAGNOSIS — M792 Neuralgia and neuritis, unspecified: Secondary | ICD-10-CM | POA: Insufficient documentation

## 2019-11-10 MED ORDER — CEPHALEXIN 500 MG PO CAPS: 500.0000 mg | ORAL_CAPSULE | Freq: Four times a day (QID) | ORAL | 0 refills | Status: AC

## 2019-11-10 MED ORDER — CEFAZOLIN SODIUM-DEXTROSE 2-4 GM/100ML-% IV SOLN
2.0000 g | Freq: Once | INTRAVENOUS | Status: AC
  Administered 2019-11-10: 2 g via INTRAVENOUS
  Filled 2019-11-10: qty 100

## 2019-11-10 MED ORDER — FENTANYL CITRATE (PF) 100 MCG/2ML IJ SOLN
INTRAMUSCULAR | Status: AC
  Filled 2019-11-10: qty 2

## 2019-11-10 MED ORDER — MIDAZOLAM HCL 5 MG/5ML IJ SOLN
1.0000 mg | INTRAMUSCULAR | Status: DC | PRN
  Administered 2019-11-10: 1 mg via INTRAVENOUS

## 2019-11-10 MED ORDER — LIDOCAINE HCL 2 % IJ SOLN
INTRAMUSCULAR | Status: AC
  Filled 2019-11-10: qty 10

## 2019-11-10 MED ORDER — MIDAZOLAM HCL 5 MG/5ML IJ SOLN
INTRAMUSCULAR | Status: AC
  Filled 2019-11-10: qty 5

## 2019-11-10 MED ORDER — LACTATED RINGERS IV SOLN
1000.0000 mL | Freq: Once | INTRAVENOUS | Status: AC
  Administered 2019-11-10: 1000 mL via INTRAVENOUS

## 2019-11-10 MED ORDER — CEFAZOLIN SODIUM 1 G IJ SOLR
INTRAMUSCULAR | Status: AC
  Filled 2019-11-10: qty 20

## 2019-11-10 MED ORDER — LIDOCAINE HCL 2 % IJ SOLN
20.0000 mL | Freq: Once | INTRAMUSCULAR | Status: AC
  Administered 2019-11-10: 200 mg

## 2019-11-10 MED ORDER — FENTANYL CITRATE (PF) 100 MCG/2ML IJ SOLN
25.0000 ug | INTRAMUSCULAR | Status: DC | PRN
  Administered 2019-11-10: 100 ug via INTRAVENOUS

## 2019-11-10 NOTE — Progress Notes (Signed)
Safety precautions to be maintained throughout the outpatient stay will include: orient to surroundings, keep bed in low position, maintain call bell within reach at all times, provide assistance with transfer out of bed and ambulation.  

## 2019-11-10 NOTE — Progress Notes (Signed)
Entry at T12 L1 End T7

## 2019-11-10 NOTE — Progress Notes (Signed)
PROVIDER NOTE: Information contained herein reflects review and annotations entered in association with encounter. Interpretation of such information and data should be left to medically-trained personnel. Information provided to patient can be located elsewhere in the medical record under "Patient Instructions". Document created using STT-dictation technology, any transcriptional errors that may result from process are unintentional.    Patient: Rachael Jordan  Service Category: Procedure  Provider: Gillis Santa, MD  DOB: [birth year]  DOS: 11/10/2019  Location: [facility]  MRN: [id number]  Setting: Ambulatory - outpatient  Referring Provider: Sharyne Peach, MD  Type: Established Patient  Specialty: Interventional Pain Management  PCP: Sharyne Peach, MD   Primary Reason for Admission: Surgical management of chronic pain condition.  Procedure:  Anesthesia, Analgesia, Anxiolysis:  Type: Trial Spinal Cord Neurostimulator Implant (Percutaneous, interlaminar, posterior epidural placement) Purpose: To determine if a permanent implant may be effective in controlling some or all of Rachael Jordan's chronic pain symptoms.  Region: Lumbar Level: T12-L1 Laterality: Bilateral Paramedial  Type: Moderate (Conscious) Sedation combined with Local Anesthesia Indication(s): Analgesia and Anxiety Route: Intravenous (IV) IV Access: Secured Sedation: Meaningful verbal contact was maintained at all times during the procedure  Local Anesthetic: Lidocaine 1-2%   Indications: 1. Chronic radicular lumbar pain   2. Neuropathic pain of left lower extremity   3. Neuropathic pain of right lower extremity   4. Lumbar radiculopathy   5. Chronic pain syndrome    Pain Score: Pre-procedure: 9 /10 Post-procedure: 8  (incisional pain at the site.  ice pack to area.  )/10   Pre-op Assessment:  Rachael Jordan is a 64 y.o. (year old), female patient, seen today for interventional treatment. She  has a past  surgical history that includes Appendectomy; Abdominal hysterectomy; Neck surgery; Foot surgery (Left); and Colonoscopy with propofol (N/A, 12/29/2018).  Initial Vital Signs:  Pulse/EKG Rate: 96ECG Heart Rate: 98 (nsr) Temp: (!) 97.5 F (36.4 C) Resp: 18 BP: 129/79 SpO2: 98 %  BMI: Estimated body mass index is 25.46 kg/m as calculated from the following:   Height as of this encounter: 5' 5"  (1.651 m).   Weight as of this encounter: 153 lb (69.4 kg).  Risk Assessment: Allergies: Reviewed. She is allergic to grapefruit extract, propoxyphene, oxycodone, and prednisolone.  Allergy Precautions: None required Coagulopathies: Reviewed. None identified.  Blood-thinner therapy: None at this time Active Infection(s): Reviewed. None identified. Rachael Jordan is afebrile  Site Confirmation: Rachael Jordan was asked to confirm the procedure and laterality before marking the site, which she did. Procedure checklist: Completed Consent: Before the procedure and under the influence of no sedative(s), amnesic(s), or anxiolytics, the patient was informed of the treatment options, risks and possible complications. To fulfill our ethical and legal obligations, as recommended by the American Medical Association's Code of Ethics, I have informed the patient of my clinical impression; the nature and purpose of the treatment or procedure; the risks, benefits, and possible complications of the intervention; the alternatives, including doing nothing; the risk(s) and benefit(s) of the alternative treatment(s) or procedure(s); and the risk(s) and benefit(s) of doing nothing.  Rachael Jordan was provided with information about the general risks and possible complications associated with most interventional procedures. These include, but are not limited to: failure to achieve desired goals, infection, bleeding, organ or nerve damage, allergic reactions, paralysis, and/or death.  In addition, she was informed of those risks and  possible complications associated to this particular procedure, which include, but are not limited to: damage to the implant;  failure to decrease pain; local, systemic, or serious CNS infections, intraspinal abscess with possible cord compression and paralysis, or life-threatening such as meningitis; intrathecal and/or epidural bleeding with formation of hematoma with possible spinal cord compression and permanent paralysis; organ damage; nerve injury or damage with subsequent sensory, motor, and/or autonomic system dysfunction, resulting in transient or permanent pain, numbness, and/or weakness of one or several areas of the body; allergic reactions, either minor or major life-threatening, such as anaphylactic or anaphylactoid reactions.  Furthermore, Rachael Jordan was informed of those risks and complications associated with the medications. These include, but are not limited to: allergic reactions (i.e.: anaphylactic or anaphylactoid reactions); arrhythmia;  Hypotension/hypertension; cardiovascular collapse; respiratory depression and/or shortness of breath; swelling or edema; medication-induced neural toxicity; particulate matter embolism and blood vessel occlusion with resultant organ, and/or nervous system infarction and permanent paralysis.  Finally, she was informed that Medicine is not an exact science; therefore, there is also the possibility of unforeseen or unpredictable risks and/or possible complications that may result in a catastrophic outcome. The patient indicated having understood very clearly. We have given the patient no guarantees and we have made no promises. Enough time was given to the patient to ask questions, all of which were answered to the patient's satisfaction. Rachael Jordan has indicated that she wanted to continue with the procedure. Attestation: I, the ordering provider, attest that I have discussed with the patient the benefits, risks, side-effects, alternatives, likelihood of  achieving goals, and potential problems during recovery for the procedure that I have provided informed consent. Date   Time: 11/10/2019  9:11 AM  Pre-Procedure Preparation:  Monitoring: As per clinic protocol. Respiration, ETCO2, SpO2, BP, heart rate and rhythm monitor placed and checked for adequate function Safety Precautions: Patient was assessed for positional comfort and pressure points before starting the procedure. Time-out: I initiated and conducted the "Time-out" before starting the procedure, as per protocol. The patient was asked to participate by confirming the accuracy of the "Time Out" information. Verification of the correct person, site, and procedure were performed and confirmed by me, the nursing staff, and the patient. "Time-out" conducted as per Joint Commission's Universal Protocol (UP.01.01.01). Time: 0953  Description of Procedure Process:   Position: Prone Target Area: Posterior epidural space Approach: Posterior percutaneous, paramedial, interlaminar approach Area Prepped: Bilateral thoraco-lumbar Region Prepping solution: ChloraPrep (2% chlorhexidine gluconate and 70% isopropyl alcohol) Safety Precautions: Safe injection practices and needle disposal techniques used. Medications properly checked for expiration dates. SDV (single dose vial) medications used. Aspiration looking for blood return and/or CSF was conducted prior to all injections. At no point did I inject any substances, as a needle was being advanced. No attempts were made at seeking any paresthesias.  Description of the Procedure: Availability of a responsible, adult driver, and NPO status confirmed. Informed consent was obtained after having discussed risks and possible complications. An IV was started. The patient was then taken to the fluoroscopy suite, where the patient was placed in position for the procedure, over the fluoroscopy table. The patient was then monitored in the usual manner. Fluoroscopy was  manipulated to obtain the best possible view of the target. Parallex error was corrected before commencing the procedure. Once a clear view of the target had been obtained, the skin and deeper tissues over the procedure site were infiltrated using lidocaine, loaded in a 10 cc luer-loc syringe with a 0.5 inch, 25-G needle. The introducer needle(s) was/were then inserted through the skin and deeper tissues. A paramidline approach  was used to enter the posterior epidural space at a 30 angle, using Loss-of-resistance Technique with 3 ml of PF-NaCl (0.9% NSS). Correct needle placement was confirmed in the antero-posterior and lateral fluoroscopic views. The lead was gently introduced and manipulated under real-time fluoroscopy, constantly assessing for pain, discomfort, or paresthesias, until the tip rested at the desired level. Both sides were done in identical fashion. Electrode placement was tested until appropriate coverage was attained. Once the patient confirmed that the stimulation was over the desired area, the lead(s) was/were secured in place and the introducer needles removed. This was done under real-time fluoroscopy while observing the electrode tip to avoid unintended migration. The area was covered with a non-occlusive dressing and the patient transported to recovery for further programming.  Vitals:   11/10/19 1053 11/10/19 1110 11/10/19 1120 11/10/19 1132  BP: (!) 152/87 126/83 127/78 117/75  Pulse:      Resp: 13 12 19 15   Temp:  98.1 F (36.7 C)  98.2 F (36.8 C)  TempSrc:  Temporal  Temporal  SpO2: 100% 100% 100% 100%  Weight:      Height:       Start Time: 0954 hrs. End Time: 1042 hrs.  Neurostimulator Details:   Lead(s):  Brand: [employer] Epidural Access Level:  T12-L1 T12-L1  Lead implant:  Bilateral   No. of Electrodes/Lead:  16 16  Laterality:  Left Right  Top electrode location:  T7 T7  Model No.: [id number] Same  Length: 50 cm Same  Lot No.: [id number]  [id number]  MRI compatibility:  Yes Yes   Imaging Guidance (Spinal):          Type of Imaging Technique: Fluoroscopy Guidance (Spinal) Indication(s): Assistance in needle guidance and placement for procedures requiring needle placement in or near specific anatomical locations not easily accessible without such assistance. Exposure Time: Please see nurses notes. Contrast: None used. Fluoroscopic Guidance: I was personally present during the use of fluoroscopy. "Tunnel Vision Technique" used to obtain the best possible view of the target area. Parallax error corrected before commencing the procedure. "Direction-depth-direction" technique used to introduce the needle under continuous pulsed fluoroscopy. Once target was reached, antero-posterior, oblique, and lateral fluoroscopic projection used confirm needle placement in all planes. Images permanently stored in EMR. Interpretation: No contrast injected. I personally interpreted the imaging intraoperatively. Adequate needle placement confirmed in multiple planes. Permanent images saved into the patient's record.  Antibiotic Prophylaxis:   Anti-infectives (From admission, onward)   Start     Dose/Rate Route Frequency Ordered Stop   11/10/19 0945  ceFAZolin (ANCEF) IVPB 2g/100 mL premix        2 g 200 mL/hr over 30 Minutes Intravenous  Once 11/10/19 0944 11/10/19 1026   11/10/19 0000  cephALEXin (KEFLEX) 500 MG capsule        500 mg Oral 4 times daily 11/10/19 0940 11/17/19 2359      Post-operative Assessment:  Post-procedure Vital Signs:  Pulse/HCG Rate: 9683 Temp: 98.2 F (36.8 C) Resp: 15 BP: 117/75 SpO2: 650 %  Complications: No immediate post-treatment complications observed by team, or reported by patient.  Note: The patient tolerated the entire procedure well. A repeat set of vitals were taken after the procedure and the patient was kept under observation following institutional policy, for this type of procedure. Post-procedural  neurological assessment was performed, showing return to baseline, prior to discharge. The patient was provided with post-procedure discharge instructions, including a section on how to identify potential problems. Should any problems  arise concerning this procedure, the patient was given instructions to immediately contact us, at any time, without hesitation. In any case, we plan to contact the patient by telephone for a follow-up status report regarding this interventional procedure.  Comments:  No additional relevant information.  Plan of Care  Orders:  Orders Placed This Encounter  Procedures   DG PAIN CL[facility] C-ARM 1-60 MIN NO REPORT    Intraoperative interpretation by procedural physician at [facility].    Standing Status:   Standing    Number of Occurrences:   1    Order Specific Question:   Reason for exam:    Answer:   Assistance in needle guidance and placement for procedures requiring needle placement in or near specific anatomical locations not easily accessible without such assistance.   Medications administered: We administered lidocaine, ceFAZolin, lactated ringers, midazolam, and fentaNYL.  See the medical record for exact dosing, route, and time of administration.  Follow-up plan:   Return in about 1 week (around 11/17/2019) for SCS lead pull ([place]).    Recent Visits Date Type Provider Dept  10/26/19 Office Visit Gillis Santa, MD [facility]  Showing recent visits within past 90 days and meeting all other requirements Today's Visits Date Type Provider Dept  11/10/19 Procedure visit Gillis Santa, MD [facility]  Showing today's visits and meeting all other requirements Future Appointments Date Type Provider Dept  11/17/19 Appointment Gillis Santa, MD [facility]  Showing future appointments within next 90 days and meeting all other requirements  Disposition: Discharge home  Discharge (Date   Time): 11/10/2019; 1153 hrs.    Primary Care Physician: Sharyne Peach, MD Location: [facility] Note by: Gillis Santa, MD Date: 11/10/2019; Time: 12:05 PM

## 2019-11-10 NOTE — Patient Instructions (Addendum)
Today we did the following -We have done a Spinal Cord Stimulator Trial with [employer]  -As long as the leads are in place, do not bathe or shower. You may sponge bathe.  -While the lead is in place, please limit the bending, lifting, or twisting because the lead can move.  -The things we want to see is if your pain improves (and by what percentage), if you can do more activity (don't overdo it), and if you can use less of your "as needed" medicine. Do not stop long acting medicines like methadone, oxycontin, MS Contin, etc without checking with [place].  -It is VERY important that you pick up the antibiotics we prescribed, Keflex, on your way home from the trial and take them as prescribed(4 times a day), starting today, for as long as the lead is in place.  -The Spina Cord Stimulator Representative will be in contact with you while the lead is in place to make sure the trial goes as well as possible.  -Please contact us with any questions or concerns at any time during the trial.   -If you start running a fever over 100 degrees, have severe back pain, or new pain running down the legs, or drainage coming from the lead site, contact us immediately and/or go to the emergency room.  -Please do not restart any sort of medication that can thin your blood such as Aspirin, ibuprofen, motrin, aleve, plavix, coumadin, etc. If you aren't sure, call and ask.  -We will have you return on Wed Sept 1  to have the lead removed. If this is successful, at that point we can go over the details about the permanent implant.   ____________________________________________________________________________________________  Post-Procedure Discharge Instructions  Instructions:  Apply ice:   Purpose: This will minimize any swelling and discomfort after procedure.   When: Day of procedure, as soon as you get home.  How: Fill a plastic sandwich bag with crushed ice. Cover it with a small towel and apply to  injection site.  How long: (15 min on, 15 min off) Apply for 15 minutes then remove x 15 minutes.  Repeat sequence on day of procedure, until you go to bed.  Apply heat:   Purpose: To treat any soreness and discomfort from the procedure.  When: Starting the next day after the procedure.  How: Apply heat to procedure site starting the day following the procedure.  How long: May continue to repeat daily, until discomfort goes away.  Food intake: Start with clear liquids (like water) and advance to regular food, as tolerated.   Physical activities: Keep activities to a minimum for the first 8 hours after the procedure. After that, then as tolerated.  Driving: If you have received any sedation, be responsible and do not drive. You are not allowed to drive for 24 hours after having sedation.  Blood thinner: (Applies only to those taking blood thinners) You may restart your blood thinner 6 hours after your procedure.  Insulin: (Applies only to Diabetic patients taking insulin) As soon as you can eat, you may resume your normal dosing schedule.  Infection prevention: Keep procedure site clean and dry. Shower daily and clean area with soap and water.  Post-procedure Pain Diary: Extremely important that this be done correctly and accurately. Recorded information will be used to determine the next step in treatment. For the purpose of accuracy, follow these rules:  Evaluate only the area treated. Do not report or include pain from an untreated area.  For the purpose of this evaluation, ignore all other areas of pain, except for the treated area.  After your procedure, avoid taking a long nap and attempting to complete the pain diary after you wake up. Instead, set your alarm clock to go off every hour, on the hour, for the initial 8 hours after the procedure. Document the duration of the numbing medicine, and the relief you are getting from it.  Do not go to sleep and attempt to complete it  later. It will not be accurate. If you received sedation, it is likely that you were given a medication that may cause amnesia. Because of this, completing the diary at a later time may cause the information to be inaccurate. This information is needed to plan your care.  Follow-up appointment: Keep your post-procedure follow-up evaluation appointment after the procedure (usually 2 weeks for most procedures, 6 weeks for radiofrequencies). DO NOT FORGET to bring you pain diary with you.   Expect: (What should I expect to see with my procedure?)  From numbing medicine (AKA: Local Anesthetics): Numbness or decrease in pain. You may also experience some weakness, which if present, could last for the duration of the local anesthetic.  Onset: Full effect within 15 minutes of injected.  Duration: It will depend on the type of local anesthetic used. On the average, 1 to 8 hours.   From steroids (Applies only if steroids were used): Decrease in swelling or inflammation. Once inflammation is improved, relief of the pain will follow.  Onset of benefits: Depends on the amount of swelling present. The more swelling, the longer it will take for the benefits to be seen. In some cases, up to 10 days.  Duration: Steroids will stay in the system x 2 weeks. Duration of benefits will depend on multiple posibilities including persistent irritating factors.  Side-effects: If present, they may typically last 2 weeks (the duration of the steroids).  Frequent: Cramps (if they occur, drink Gatorade and take over-the-counter Magnesium 450-500 mg once to twice a day); water retention with temporary weight gain; increases in blood sugar; decreased immune system response; increased appetite.  Occasional: Facial flushing (red, warm cheeks); mood swings; menstrual changes.  Uncommon: Long-term decrease or suppression of natural hormones; bone thinning. (These are more common with higher doses or more frequent use. This is  why we prefer that our patients avoid having any injection therapies in other practices.)   Very Rare: Severe mood changes; psychosis; aseptic necrosis.  From procedure: Some discomfort is to be expected once the numbing medicine wears off. This should be minimal if ice and heat are applied as instructed.  Call if: (When should I call?)  You experience numbness and weakness that gets worse with time, as opposed to wearing off.  New onset bowel or bladder incontinence. (Applies only to procedures done in the spine)  Emergency Numbers:  Durning business hours (Monday - Thursday, 8:00 AM - 4:00 PM) (Friday, 9:00 AM - 12:00 Noon): [phone]  After hours: [phone]  NOTE: If you are having a problem and are unable connect with, or to talk to a provider, then go to your nearest urgent care or emergency department. If the problem is serious and urgent, please call 911. ____________________________________________________________________________________________

## 2019-11-11 ENCOUNTER — Telehealth: Payer: Self-pay | Admitting: *Deleted

## 2019-11-11 ENCOUNTER — Telehealth: Payer: Self-pay | Admitting: Student in an Organized Health Care Education/Training Program

## 2019-11-11 NOTE — Telephone Encounter (Signed)
Attempted to call patient .  Message states that patient is not accepting calls right now.

## 2019-11-11 NOTE — Telephone Encounter (Signed)
Patient called stating she is itching really bad from the tape and also having some pretty bad pain on the right side down the back of her leg. Please call asap

## 2019-11-11 NOTE — Telephone Encounter (Signed)
Called patient for post SCS trial check in. States no oozing at the dressing site. Denies fever. States taking antibiotics as ordered. Denies any issues overnight except some expected soreness. Instructed to call for any issues or concerns.

## 2019-11-17 ENCOUNTER — Other Ambulatory Visit: Payer: Self-pay | Admitting: Student in an Organized Health Care Education/Training Program

## 2019-11-17 ENCOUNTER — Ambulatory Visit
Admission: RE | Admit: 2019-11-17 | Discharge: 2019-11-17 | Disposition: A | Discharge status: Home / Self Care | Payer: 59 | Source: Ambulatory Visit | Attending: Student in an Organized Health Care Education/Training Program | Admitting: Student in an Organized Health Care Education/Training Program

## 2019-11-17 ENCOUNTER — Encounter: Payer: Self-pay | Admitting: Student in an Organized Health Care Education/Training Program

## 2019-11-17 ENCOUNTER — Ambulatory Visit (HOSPITAL_BASED_OUTPATIENT_CLINIC_OR_DEPARTMENT_OTHER): Payer: 59 | Admitting: Student in an Organized Health Care Education/Training Program

## 2019-11-17 ENCOUNTER — Other Ambulatory Visit: Payer: Self-pay

## 2019-11-17 VITALS — BP 129/91 | HR 100 | Temp 98.3°F | Resp 16 | Ht 65.0 in | Wt 153.0 lb

## 2019-11-17 DIAGNOSIS — G894 Chronic pain syndrome: Secondary | ICD-10-CM

## 2019-11-17 DIAGNOSIS — R52 Pain, unspecified: Secondary | ICD-10-CM

## 2019-11-17 DIAGNOSIS — M5416 Radiculopathy, lumbar region: Secondary | ICD-10-CM

## 2019-11-17 DIAGNOSIS — G8929 Other chronic pain: Secondary | ICD-10-CM

## 2019-11-17 DIAGNOSIS — M792 Neuralgia and neuritis, unspecified: Secondary | ICD-10-CM

## 2019-11-17 NOTE — Progress Notes (Signed)
77  Dr Holley Raring in for removal of SCS trial. Removal successful. Patient with blister on upper back from tape. Patient came in with paper tape on.  Ointment applied and instructed patient on need to keep clean. Patient with understanding. Ms Keeling not sure if she wants to have a perm SCS done.

## 2019-11-17 NOTE — Progress Notes (Signed)
Safety precautions to be maintained throughout the outpatient stay will include: orient to surroundings, keep bed in low position, maintain call bell within reach at all times, provide assistance with transfer out of bed and ambulation.  

## 2019-11-17 NOTE — Progress Notes (Signed)
PROVIDER NOTE: Information contained herein reflects review and annotations entered in association with encounter. Interpretation of such information and data should be left to medically-trained personnel. Information provided to patient can be located elsewhere in the medical record under "Patient Instructions". Document created using STT-dictation technology, any transcriptional errors that may result from process are unintentional.    Patient: Rachael Jordan  Service Category: E/M  Provider: Gillis Santa, MD  DOB: 21-Apr-1955  DOS: 11/17/2019  Specialty: Interventional Pain Management  MRN: [id number]  Setting: Ambulatory outpatient  PCP: Sharyne Peach, MD  Type: Established Patient    Referring Provider: Sharyne Peach, MD  Location: Office  Delivery: Face-to-face     HPI  Reason for encounter: [name], a 64 y.o. year old female, is here today for evaluation and management of her Chronic radicular lumbar pain [M54.16, G89.29]. Rachael Jordan primary complain today is Back Pain (lumbar bilateral ) Last encounter: Practice (11/11/2019). My last encounter with her was on 11/11/2019. Pertinent problems: Rachael Jordan has DDD (degenerative disc disease), lumbar; Lumbar radiculopathy; Spinal stenosis, lumbar region, with neurogenic claudication; Back pain, chronic; and Chronic pain syndrome on their pertinent problem list. Pain Assessment: Severity of Chronic pain is reported as a 6 /10. Location: Back Lower, Left, Right/into the hips and legs and around the right side to the abdomen and sometimes on the left. Onset: More than a month ago. Quality: Constant, Dull, Discomfort, Other (Comment) (strong). Timing: Constant. Modifying factor(s): SCS along with medications has helped approx 50% with pain. Vitals:  height is 5' 5"  (1.651 m) and weight is 153 lb (69.4 kg). Her oral temperature is 98.3 F (36.8 C). Her blood pressure is 129/91 (abnormal) and her pulse is 100. Her respiration is 16 and  oxygen saturation is 99%.   Patient presents today for removal of [employer] spinal cord stimulator trial leads.  She endorses approximately 50% pain relief with improvement in functional status during her spinal cord stimulator trial.  She did have some issues with itching associated with the adhesive.  She states that she would like to think about permanent implant further.  I informed her that if she would like to proceed with the permanent implant to contact our clinic at which point we can place referral to Dr. Cari Caraway for spinal cord stimulator implant with [facility] or she can contact Dr. Nelly Laurence clinic to request implantation of spinal cord stimulator with [employer].  Patient endorsed understanding.   ROS  Constitutional: Denies any fever or chills Gastrointestinal: No reported hemesis, hematochezia, vomiting, or acute GI distress Musculoskeletal: Low back pain, bilateral hip pain Neurological: No reported episodes of acute onset apraxia, aphasia, dysarthria, agnosia, amnesia, paralysis, loss of coordination, or loss of consciousness  Medication Review  Biotin, Cholecalciferol, Multiple Vitamins-Minerals, Vitamin E, albuterol, amLODipine, aspirin EC, azelastine, budesonide-formoterol, cephALEXin, cetirizine, diclofenac Sodium, montelukast, nortriptyline, olmesartan, pantoprazole, pyridOXINE, rizatriptan, tiZANidine, traZODone, valACYclovir, venlafaxine XR, and vitamin C  History Review  Allergy: [name] is allergic to grapefruit extract, propoxyphene, oxycodone, and prednisolone. Drug: Ms. Rachels  reports no history of drug use. Alcohol:  reports no history of alcohol use. Tobacco:  reports that she has never smoked. She has never used smokeless tobacco. Social: Rachael Jordan  reports that she has never smoked. She has never used smokeless tobacco. She reports that she does not drink alcohol and does not use drugs. Medical:  has a past medical history of  Acid reflux, Anxiety, Arthritis, Bronchitis (09/2015), Gross hematuria, H. pylori infection,  Heart murmur, Kidney failure, Labile essential hypertension (01/06/2018), Lung abnormality, Neck pain, Nephrolithiasis, Pneumonia, Pneumothorax, Scoliosis, Urinary frequency, and Vaginal atrophy. Surgical: [name]  has a past surgical history that includes Appendectomy; Abdominal hysterectomy; Neck surgery; Foot surgery (Left); and Colonoscopy with propofol (N/A, 12/29/2018). Family: family history includes Arthritis in her mother; Asthma in her mother; Cancer in her maternal aunt; Diabetes in her sister; Hyperlipidemia in her mother; Hypertension in her mother.  Laboratory Chemistry Profile   Renal Lab Results  Component Value Date   BUN 9 06/18/2019   CREATININE 1.18 (H) 06/18/2019   LABCREA 157 12/28/2018   BCR 11 (L) 09/29/2017   GFRAA 58 (L) 06/18/2019   GFRNONAA 50 (L) 06/18/2019     Hepatic Lab Results  Component Value Date   AST 21 06/04/2019   ALT 11 06/04/2019   ALBUMIN 3.9 06/04/2019   ALKPHOS 51 06/04/2019   LIPASE 25 06/04/2019     Electrolytes Lab Results  Component Value Date   NA 144 06/18/2019   K 3.6 06/18/2019   CL 107 06/18/2019   CALCIUM 9.2 06/18/2019   MG 2.0 02/20/2019   PHOS 3.9 01/01/2019     Bone No results found for: VD25OH, VD125OH2TOT, WU9811BJ4, NW2956OZ3, 25OHVITD1, 25OHVITD2, 25OHVITD3, TESTOFREE, TESTOSTERONE   Inflammation (CRP: Acute Phase) (ESR: Chronic Phase) No results found for: CRP, ESRSEDRATE, LATICACIDVEN     Note: Above Lab results reviewed.  Recent Imaging Review  DG PAIN CLINIC C-ARM 1-60 MIN NO REPORT Fluoro was used, but no Radiologist interpretation will be provided.  Please refer to "NOTES" tab for provider progress note. Note: Reviewed        Physical Exam  General appearance: Well nourished, well developed, and well hydrated. In no apparent acute distress Mental status: Alert, oriented x 3 (person, place, & time)        Respiratory: No evidence of acute respiratory distress Eyes: PERLA Vitals: BP (!) 129/91 (BP Location: Right Arm, Patient Position: Sitting, Cuff Size: Normal)   Pulse 100   Temp 98.3 F (36.8 C) (Oral)   Resp 16   Ht 5' 5"  (1.651 m)   Wt 153 lb (69.4 kg)   SpO2 99%   BMI 25.46 kg/m  BMI: Estimated body mass index is 25.46 kg/m as calculated from the following:   Height as of this encounter: 5' 5"  (1.651 m).   Weight as of this encounter: 153 lb (69.4 kg). Ideal: Ideal body weight: 57 kg (125 lb 10.6 oz) Adjusted ideal body weight: 62 kg (136 lb 9.6 oz)  Spinal cord stimulator trial leads removed under live fluoroscopy, tips intact.  5 out of 5 strength bilateral lower extremity: Plantar flexion, dorsiflexion, knee flexion, knee extension.   Assessment   Status Diagnosis  Controlled Controlled Controlled 1. Chronic radicular lumbar pain   2. Neuropathic pain of left lower extremity   3. Neuropathic pain of right lower extremity   4. Lumbar radiculopathy   5. Chronic pain syndrome      Updated Problems: Problem  Chronic Pain Syndrome  Ddd (Degenerative Disc Disease), Lumbar  Lumbar Radiculopathy  Spinal Stenosis, Lumbar Region, With Neurogenic Claudication  Back Pain, Chronic    Plan of Care  Status post successful [employer] spinal cord stimulator trial.  Trial leads removed under live fluoroscopy.  Tips intact. Patient will contact Dr. Nelly Laurence clinic or our clinic if she would like to move forward with permanent [facility] Scientific spinal cord stimulator implant.  Follow-up plan:   No follow-ups on  file.   Recent Visits Date Type Provider Dept  11/10/19 Procedure visit Gillis Santa, MD [facility]  10/26/19 Office Visit Gillis Santa, MD [facility]  Showing recent visits within past 90 days and meeting all other requirements Today's Visits Date Type Provider Dept  11/17/19 Procedure visit Gillis Santa, MD [facility]  Showing today's visits and meeting all other requirements Future Appointments No visits were found meeting these conditions. Showing future appointments within next 90 days and meeting all other requirements  I discussed the assessment and treatment plan with the patient. The patient was provided an opportunity to ask questions and all were answered. The patient agreed with the plan and demonstrated an understanding of the instructions.  Patient advised to call back or seek an in-person evaluation if the symptoms or condition worsens.  Duration of encounter: 15 minutes.  Note by: Gillis Santa, MD Date: 11/17/2019; Time: 3:38 PM

## 2019-11-29 ENCOUNTER — Ambulatory Visit: Payer: 59 | Admitting: Student in an Organized Health Care Education/Training Program

## 2019-12-01 NOTE — Progress Notes (Signed)
12/02/2019 11:52 AM   Rachael Jordan 1955/09/24 [id number]  Referring provider: Sharyne Peach, MD [place],  [place] [address]  Chief Complaint  Patient presents with  . Hematuria    HPI: Rachael Jordan is a 64 year old female with a high risk hematuria, urinary frequency and vaginal atrophy who presents today for follow up.    History of hematuria (high risk) Non-smoker.  Hematuria work up in 06/2014 was negative.  CTU in 10/2017 revealed normal adrenal glands. The kidneys are both unremarkable. No kidney stones identified. No hydronephrosis or hydroureter. No kidney mass identified. No suspicious filling defects identified within the collecting systems, ureters or urinary bladder.  Cystoscopy in 10/2017 with Dr. Bernardo Heater was NED.  She reported an episode of gross hematuria in June 2020.  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 No evidence of active glomerulonephritis or immune complex mediated renal disease.   Non contrast CT 04/2019 for left flank pain there are no abnormal calcifications within the collecting system of either kidney, along the course of either ureter, or within the lumen of the urinary bladder. No hydroureteronephrosis or perinephric stranding to suggest urinary tract obstruction at this time. The unenhanced appearance of the kidneys is unremarkable bilaterally. Unenhanced appearance of the urinary bladder is normal. Bilateral adrenal glands are normal in appearance.   No reports of gross hematuria.  UA today negative for micro heme.    Urinary frequency Discontinued PTNS treatments. She is experiencing urgency x 4-7 (worse) , frequency x 8 or more (worse), is restricting fluids to avoid visits to the restroom, is engaging in toilet mapping, incontinence x 0-3 (improved) and nocturia x 4-7 (stable).   Her BP is 126/84.   Her PVR is 38 mL.  Most bothersome symptoms are frequency  and incontinence.   Has been without OAB medications for several months.  Patient denies any modifying or aggravating factors.  Patient denies any gross hematuria, dysuria or suprapubic/flank pain.  Patient denies any fevers, chills, nausea or vomiting.    PMH: Past Medical History:  Diagnosis Date  . Acid reflux   . Anxiety   . Arthritis   . Bronchitis 09/2015  . Gross hematuria   . H. pylori infection   . Heart murmur   . Kidney failure   . 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 Lung abnormality    "damaged lung due to pneumonia"  . Neck pain   . Nephrolithiasis   . Pneumonia   . Pneumothorax   . Scoliosis   . Urinary frequency   . Vaginal atrophy     Surgical History: Past Surgical History:  Procedure Laterality Date  . ABDOMINAL HYSTERECTOMY     vaginal  . APPENDECTOMY    . COLONOSCOPY WITH PROPOFOL N/A 12/29/2018   Procedure: COLONOSCOPY WITH PROPOFOL;  Surgeon: Lucilla Lame, MD;  Location: [facility];  Service: Endoscopy;  Laterality: N/A;  . FOOT SURGERY Left    10/2015  . NECK SURGERY      Home Medications:  Allergies as of 12/02/2019      Reactions   Grapefruit Extract Rash   Propoxyphene Nausea And Vomiting, Nausea Only   Oxycodone Itching   Prednisolone    Psychotic features      Medication List       Accurate as of December 02, 2019 11:59 PM. If you have any questions, ask your nurse or doctor.  STOP taking these medications   diclofenac Sodium 1 % Gel Commonly known as: VOLTAREN Stopped by: Chief Walkup, PA-C   nortriptyline 10 MG capsule Commonly known as: PAMELOR Stopped by: Ivorie Uplinger, PA-C     TAKE these medications   albuterol (2.5 MG/3ML) 0.083% nebulizer solution Commonly known as: PROVENTIL Take 2.5 mg by nebulization every 6 (six) hours as needed for wheezing or shortness of breath.   albuterol 108 (90 Base) MCG/ACT inhaler Commonly known as: VENTOLIN HFA Inhale 2 puffs into the lungs every 6 (six)  hours as needed for wheezing or shortness of breath.   amLODipine 5 MG tablet Commonly known as: NORVASC Take 5 mg by mouth daily.   aspirin EC 81 MG tablet Take 81 mg by mouth daily.   azelastine 0.1 % nasal spray Commonly known as: ASTELIN Place 1-2 sprays into both nostrils 2 (two) times daily.   Biotin 10000 MCG Tabs Take 10 mg by mouth daily.   budesonide-formoterol 80-4.5 MCG/ACT inhaler Commonly known as: SYMBICORT Inhale 2 puffs into the lungs 2 (two) times daily.   CENTRUM ADULTS PO Take 1 tablet by mouth daily.   cetirizine 10 MG tablet Commonly known as: ZYRTEC Take 10 mg by mouth at bedtime.   gabapentin 800 MG tablet Commonly known as: NEURONTIN Take 800 mg by mouth 3 (three) times daily.   montelukast 10 MG tablet Commonly known as: SINGULAIR Take 10 mg by mouth at bedtime.   olmesartan 20 MG tablet Commonly known as: BENICAR Take 40 mg by mouth daily.   olmesartan 40 MG tablet Commonly known as: BENICAR Take 40 mg by mouth daily.   oxycodone-acetaminophen 10-300 MG tablet Commonly known as: LYNOX Take 1 tablet by mouth every 4 (four) hours as needed for pain.   pantoprazole 40 MG tablet Commonly known as: PROTONIX Take 40 mg by mouth daily.   pyridOXINE 100 MG tablet Commonly known as: VITAMIN B-6 Take 100 mg by mouth daily.   rizatriptan 10 MG tablet Commonly known as: MAXALT Take 10 mg by mouth as needed for migraine. May repeat in 2 hours if needed   tiZANidine 4 MG tablet Commonly known as: ZANAFLEX Take 4 mg by mouth 4 (four) times daily as needed for muscle spasms.   traZODone 100 MG tablet Commonly known as: DESYREL Take 200 mg by mouth at bedtime.   Trospium Chloride 60 MG Cp24 Take 1 capsule (60 mg total) by mouth daily. Started by: Zara Council, PA-C   valACYclovir 1000 MG tablet Commonly known as: VALTREX Take 1,000 mg by mouth daily.   venlafaxine XR 75 MG 24 hr capsule Commonly known as: EFFEXOR-XR Take 37.5  mg by mouth daily.   vitamin C 500 MG tablet Commonly known as: ASCORBIC ACID Take 500 mg by mouth daily.   Vitamin D-1000 Max St 25 MCG (1000 UT) tablet Generic drug: Cholecalciferol Take 1,000 Units by mouth daily.   Vitamin E 45 MG Caps Take 90 mg by mouth daily.       Allergies:  Allergies  Allergen Reactions  . Grapefruit Extract Rash  . Propoxyphene Nausea And Vomiting and Nausea Only  . Oxycodone Itching  . Prednisolone     Psychotic features    Family History: Family History  Problem Relation Age of Onset  . Arthritis Mother   . Asthma Mother   . Hyperlipidemia Mother   . Hypertension Mother   . Diabetes Sister   . Cancer Maternal Aunt  esophagus  . Breast cancer Neg Hx   . Kidney cancer Neg Hx   . Bladder Cancer Neg Hx     Social History:  reports that she has never smoked. She has never used smokeless tobacco. She reports that she does not drink alcohol and does not use drugs.  ROS: For pertinent review of systems please refer to history of present illness  Physical Exam: BP 126/84   Pulse 88   Ht 5' 5"  (1.651 m)   Wt 153 lb (69.4 kg)   BMI 25.46 kg/m   Constitutional:  Well nourished. Alert and oriented, No acute distress. HEENT: [place] AT, mask in place  Trachea midline Cardiovascular: No clubbing, cyanosis, or edema. Respiratory: Normal respiratory effort, no increased work of breathing. Neurologic: Grossly intact, no focal deficits, moving all 4 extremities. Psychiatric: Normal mood and affect.   Laboratory Data: Lab Results  Component Value Date   WBC 8.3 06/18/2019   HGB 10.6 (L) 06/18/2019   HCT 31.1 (L) 06/18/2019   MCV 86.9 06/18/2019   PLT 314 06/18/2019    Lab Results  Component Value Date   CREATININE 1.18 (H) 06/18/2019    Lab Results  Component Value Date   HGBA1C 5.5 02/19/2019    Lab Results  Component Value Date   TSH 0.580 02/19/2019       Component Value Date/Time   CHOL 133 02/19/2019 0516   HDL 52  02/19/2019 0516   CHOLHDL 2.6 02/19/2019 0516   VLDL 14 02/19/2019 0516   LDLCALC 67 02/19/2019 0516    Lab Results  Component Value Date   AST 21 06/04/2019   Lab Results  Component Value Date   ALT 11 06/04/2019    Urinalysis Component     Latest Ref Rng & Units 12/02/2019  Specific Gravity, UA     1.005 - 1.030 1.010  pH, UA     5.0 - 7.5 7.0  Color, UA     Yellow Straw  Appearance Ur     Clear Clear  Leukocytes,UA     Negative Negative  Protein,UA     Negative/Trace Negative  Glucose, UA     Negative Negative  Ketones, UA     Negative Negative  RBC, UA     Negative Negative  Bilirubin, UA     Negative Negative  Urobilinogen, Ur     0.2 - 1.0 mg/dL 0.2  Nitrite, UA     Negative Negative  Microscopic Examination      See below:   Component     Latest Ref Rng & Units 12/02/2019  WBC, UA     0 - 5 /hpf None seen  RBC     0 - 2 /hpf None seen  Epithelial Cells (non renal)     0 - 10 /hpf 0-10  Bacteria, UA     None seen/Few None seen    I have reviewed the labs.   Pertinent Imaging: Results for JAQUASHA, CARNEVALE (MRN [id number]) as of 12/02/2019 09:21  Ref. Range 12/02/2019 09:12  Scan Result Unknown 38 ml     Assessment & Plan:    1. High risk hematuria Hematuria work up completed in 10/2017 - findings positive for NED Report of gross hematuria in June 2020 - renal bx severe arterial sclerosis and mild interstitial fibrosis and tubular atrophy. No report of gross hematuria UA today is negative for micro heme RTC in one year for UA Patient to report any further episodes of hematuria  2. Incontinence  Patient has been without her OAB medication for several months We will go ahead and restart her Cloyd Stagers as it was successful controlling her symptoms in the past, but we will try the long-acting 60 mg She will contact the office if this is cost prohibitive Return to clinic in 1 year for OAB questionnaire and PVR   Return in about 1 year  (around 12/01/2020) for UA, OAB questionnaire and PVR .  These notes generated with voice recognition software. I apologize for typographical errors.  Zara Council, PA-C  [facility] [address]  [place], [place] [address] [phone]

## 2019-12-02 ENCOUNTER — Encounter: Payer: Self-pay | Admitting: Urology

## 2019-12-02 ENCOUNTER — Other Ambulatory Visit: Payer: Self-pay

## 2019-12-02 ENCOUNTER — Ambulatory Visit (INDEPENDENT_AMBULATORY_CARE_PROVIDER_SITE_OTHER): Payer: 59 | Admitting: Urology

## 2019-12-02 VITALS — BP 126/84 | HR 88 | Ht 65.0 in | Wt 153.0 lb

## 2019-12-02 DIAGNOSIS — N393 Stress incontinence (female) (male): Secondary | ICD-10-CM | POA: Diagnosis not present

## 2019-12-02 DIAGNOSIS — R319 Hematuria, unspecified: Secondary | ICD-10-CM

## 2019-12-02 LAB — URINALYSIS, COMPLETE
Bilirubin, UA: NEGATIVE
Glucose, UA: NEGATIVE
Ketones, UA: NEGATIVE
Leukocytes,UA: NEGATIVE
Nitrite, UA: NEGATIVE
Protein,UA: NEGATIVE
RBC, UA: NEGATIVE
Specific Gravity, UA: 1.01 (ref 1.005–1.030)
Urobilinogen, Ur: 0.2 mg/dL (ref 0.2–1.0)
pH, UA: 7 (ref 5.0–7.5)

## 2019-12-02 LAB — MICROSCOPIC EXAMINATION
Bacteria, UA: NONE SEEN
RBC, Urine: NONE SEEN /hpf (ref 0–2)
WBC, UA: NONE SEEN /hpf (ref 0–5)

## 2019-12-02 LAB — BLADDER SCAN AMB NON-IMAGING: Scan Result: 38

## 2019-12-02 MED ORDER — TROSPIUM CHLORIDE ER 60 MG PO CP24: 60.0000 mg | ORAL_CAPSULE | Freq: Every day | ORAL | 3 refills | Status: DC

## 2019-12-07 ENCOUNTER — Telehealth: Payer: Self-pay | Admitting: Family Medicine

## 2019-12-07 NOTE — Telephone Encounter (Signed)
Prior Authorization for Trospium has been approved. Approval Date: 12/07/2019-12/06/2020.

## 2019-12-09 ENCOUNTER — Encounter: Payer: Self-pay | Admitting: Student in an Organized Health Care Education/Training Program

## 2019-12-09 ENCOUNTER — Other Ambulatory Visit: Payer: Self-pay

## 2019-12-09 ENCOUNTER — Ambulatory Visit
Payer: 59 | Attending: Student in an Organized Health Care Education/Training Program | Admitting: Student in an Organized Health Care Education/Training Program

## 2019-12-09 DIAGNOSIS — M792 Neuralgia and neuritis, unspecified: Secondary | ICD-10-CM

## 2019-12-09 DIAGNOSIS — M5416 Radiculopathy, lumbar region: Secondary | ICD-10-CM

## 2019-12-09 DIAGNOSIS — G8929 Other chronic pain: Secondary | ICD-10-CM | POA: Diagnosis not present

## 2019-12-09 DIAGNOSIS — G894 Chronic pain syndrome: Secondary | ICD-10-CM | POA: Diagnosis not present

## 2019-12-09 NOTE — Progress Notes (Signed)
Patient: Rachael Jordan  Service Category: E/M  Provider: Gillis Santa, MD  DOB: 03-27-1955  DOS: 12/09/2019  Location: Office  MRN: [id number]  Setting: Ambulatory outpatient  Referring Provider: Sharyne Peach, MD  Type: Established Patient  Specialty: Interventional Pain Management  PCP: Sharyne Peach, MD  Location: Home  Delivery: TeleHealth     Virtual Encounter - Pain Management PROVIDER NOTE: Information contained herein reflects review and annotations entered in association with encounter. Interpretation of such information and data should be left to medically-trained personnel. Information provided to patient can be located elsewhere in the medical record under "Patient Instructions". Document created using STT-dictation technology, any transcriptional errors that may result from process are unintentional.    Contact & Pharmacy Preferred: [phone] Home: [phone] (home) Mobile: [phone] (mobile) E-mail: [email or web address], [place] - [place] [address] Phone: [phone] Fax: [phone]  [facility] - [place]ia Buff, [place] [address] [place] [address] Phone: [phone] Fax: [phone]   Pre-screening  Ms. Floor offered "in-person" vs "virtual" encounter. She indicated preferring virtual for this encounter.   Reason COVID-19*  Social distancing based on CDC and AMA recommendations.   I contacted Veleka Grimmett on 12/09/2019 via video conference.      I clearly identified myself as Gillis Santa, MD. I verified that I was speaking with the correct person using two identifiers (Name: Zohra Clavel, and date of birth: November 08, 1958).  Consent I sought verbal advanced consent from Adan Sis for virtual visit interactions. I informed Ms. Wooden of possible security and privacy concerns, risks, and limitations associated with providing  "not-in-person" medical evaluation and management services. I also informed Ms. Fabro of the availability of "in-person" appointments. Finally, I informed her that there would be a charge for the virtual visit and that she could be  personally, fully or partially, financially responsible for it. Ms. Gunnarson expressed understanding and agreed to proceed.   Historic Elements   Ms. Shanikia Kernodle is a 64 y.o. year old, female patient evaluated today after our last contact on 11/17/2019. Ms. Shiroma  has a past medical history of Acid reflux, Anxiety, Arthritis, Bronchitis (09/2015), Gross hematuria, H. pylori infection, Heart murmur, Kidney failure, Labile essential hypertension (01/06/2018), Lung abnormality, Neck pain, Nephrolithiasis, Pneumonia, Pneumothorax, Scoliosis, Urinary frequency, and Vaginal atrophy. She also  has a past surgical history that includes Appendectomy; Abdominal hysterectomy; Neck surgery; Foot surgery (Left); and Colonoscopy with propofol (N/A, 12/29/2018). Ms. Shimabukuro has a current medication list which includes the following prescription(s): albuterol, albuterol, amlodipine, aspirin ec, azelastine, biotin, budesonide-formoterol, cetirizine, cholecalciferol, gabapentin, gabapentin, montelukast, multiple vitamins-minerals, olmesartan, oxycodone-acetaminophen, pantoprazole, pyridoxine, rizatriptan, tizanidine, trazodone, trospium chloride, valacyclovir, vitamin c, vitamin e, olmesartan, and venlafaxine xr. She  reports that she has never smoked. She has never used smokeless tobacco. She reports that she does not drink alcohol and does not use drugs. Ms. Duecker is allergic to grapefruit extract, propoxyphene, oxycodone, and prednisolone.   HPI  Today, she is being contacted for worsening of previously known (established) problem  Patient endorses worsening bilateral low back pain that radiates into bilateral legs.  She states that the pain is radiating also into her buttock and posterior  thighs.  Of note she is status post a spinal cord stimulator trial with me.  Patient received mild to at best moderate pain relief with a spinal cord stimulator trial and she  is not sure whether she would like to move forward with permanent implant at this time.  If she is interested in spinal cord stimulator permanent implant, I recommend that she reach out to Dr. Cari Caraway for this.  Patient is endorsing right sided radicular pain.  Of note, she has received transforaminal epidural steroid injections in the past with Dr. Sharlet Salina with [facility].  She sees Dr. Primus Bravo in [place] for medication management.  I instructed her to reach out to Dr. Sharlet Salina to reconsider transforaminal ESI for her radicular pain flare.  Patient endorsed understanding.  Laboratory Chemistry Profile   Renal Lab Results  Component Value Date   BUN 9 06/18/2019   CREATININE 1.18 (H) 06/18/2019   LABCREA 157 12/28/2018   BCR 11 (L) 09/29/2017   GFRAA 58 (L) 06/18/2019   GFRNONAA 50 (L) 06/18/2019     Hepatic Lab Results  Component Value Date   AST 21 06/04/2019   ALT 11 06/04/2019   ALBUMIN 3.9 06/04/2019   ALKPHOS 51 06/04/2019   LIPASE 25 06/04/2019     Electrolytes Lab Results  Component Value Date   NA 144 06/18/2019   K 3.6 06/18/2019   CL 107 06/18/2019   CALCIUM 9.2 06/18/2019   MG 2.0 02/20/2019   PHOS 3.9 01/01/2019     Bone No results found for: VD25OH, VD125OH2TOT, HT3428JG8, TL5726OM3, 25OHVITD1, 25OHVITD2, 25OHVITD3, TESTOFREE, TESTOSTERONE   Inflammation (CRP: Acute Phase) (ESR: Chronic Phase) No results found for: CRP, ESRSEDRATE, LATICACIDVEN     Note: Above Lab results reviewed.  Imaging  DG PAIN CLINIC C-ARM 1-60 MIN NO REPORT Fluoro was used, but no Radiologist interpretation will be provided.  Please refer to "NOTES" tab for provider progress note.  Assessment  The primary encounter diagnosis was Chronic radicular lumbar pain. Diagnoses of Neuropathic pain of left lower  extremity, Neuropathic pain of right lower extremity, Lumbar radiculopathy, and Chronic pain syndrome were also pertinent to this visit.  Plan of Care   Status post [facility] spinal cord stimulator trial.  Unfortunately, this was not as effective for her as we would have liked (did not obtain at least 50% pain relief for 7 days during duration of trial).  She continues to have persistent radicular pain.  Recommend that she follow-up with Dr. Sharlet Salina who previously performed her transforaminal epidural steroid injections to see if that is indicated for her radicular pain flare.   Continue medication management with Dr. Primus Bravo.  Follow-up as needed.   Follow-up plan:   No follow-ups on file.   Recent Visits Date Type Provider Dept  11/17/19 Procedure visit Gillis Santa, MD [facility]  11/10/19 Procedure visit Gillis Santa, MD [facility]  10/26/19 Office Visit Gillis Santa, MD [facility]  Showing recent visits within past 90 days and meeting all other requirements Today's Visits Date Type Provider Dept  12/09/19 Telemedicine Gillis Santa, MD [facility]  Showing today's visits and meeting all other requirements Future Appointments No visits were found meeting these conditions. Showing future appointments within next 90 days and meeting all other requirements  I discussed the assessment and treatment plan with the patient. The patient was provided an opportunity to ask questions and all were answered. The patient agreed with the plan and demonstrated an understanding of the instructions.  Patient advised to call back or seek an in-person evaluation if the symptoms or condition worsens.  Duration of encounter: 47mnutes.  Note by: BGillis Santa MD Date: 12/09/2019; Time: 3:28  PM

## 2019-12-30 ENCOUNTER — Encounter: Payer: Self-pay | Admitting: Emergency Medicine

## 2019-12-30 ENCOUNTER — Emergency Department: Payer: 59

## 2019-12-30 ENCOUNTER — Inpatient Hospital Stay
Admission: EM | Admit: 2019-12-30 | Discharge: 2020-01-04 | DRG: 385 | Disposition: A | Discharge status: Home / Self Care | Payer: 59 | Attending: Internal Medicine | Admitting: Internal Medicine

## 2019-12-30 ENCOUNTER — Other Ambulatory Visit: Payer: Self-pay

## 2019-12-30 DIAGNOSIS — E785 Hyperlipidemia, unspecified: Secondary | ICD-10-CM | POA: Diagnosis present

## 2019-12-30 DIAGNOSIS — F419 Anxiety disorder, unspecified: Secondary | ICD-10-CM | POA: Diagnosis present

## 2019-12-30 DIAGNOSIS — N179 Acute kidney failure, unspecified: Secondary | ICD-10-CM

## 2019-12-30 DIAGNOSIS — R609 Edema, unspecified: Secondary | ICD-10-CM | POA: Diagnosis present

## 2019-12-30 DIAGNOSIS — Z87442 Personal history of urinary calculi: Secondary | ICD-10-CM | POA: Diagnosis not present

## 2019-12-30 DIAGNOSIS — Z79899 Other long term (current) drug therapy: Secondary | ICD-10-CM

## 2019-12-30 DIAGNOSIS — Z8701 Personal history of pneumonia (recurrent): Secondary | ICD-10-CM | POA: Diagnosis not present

## 2019-12-30 DIAGNOSIS — Z20822 Contact with and (suspected) exposure to covid-19: Secondary | ICD-10-CM | POA: Diagnosis present

## 2019-12-30 DIAGNOSIS — Z8619 Personal history of other infectious and parasitic diseases: Secondary | ICD-10-CM | POA: Diagnosis not present

## 2019-12-30 DIAGNOSIS — N189 Chronic kidney disease, unspecified: Secondary | ICD-10-CM | POA: Diagnosis not present

## 2019-12-30 DIAGNOSIS — R1084 Generalized abdominal pain: Secondary | ICD-10-CM | POA: Diagnosis present

## 2019-12-30 DIAGNOSIS — K219 Gastro-esophageal reflux disease without esophagitis: Secondary | ICD-10-CM | POA: Diagnosis present

## 2019-12-30 DIAGNOSIS — I129 Hypertensive chronic kidney disease with stage 1 through stage 4 chronic kidney disease, or unspecified chronic kidney disease: Secondary | ICD-10-CM | POA: Diagnosis present

## 2019-12-30 DIAGNOSIS — J449 Chronic obstructive pulmonary disease, unspecified: Secondary | ICD-10-CM | POA: Diagnosis present

## 2019-12-30 DIAGNOSIS — M1990 Unspecified osteoarthritis, unspecified site: Secondary | ICD-10-CM | POA: Diagnosis present

## 2019-12-30 DIAGNOSIS — E538 Deficiency of other specified B group vitamins: Secondary | ICD-10-CM | POA: Diagnosis present

## 2019-12-30 DIAGNOSIS — Z83438 Family history of other disorder of lipoprotein metabolism and other lipidemia: Secondary | ICD-10-CM

## 2019-12-30 DIAGNOSIS — K640 First degree hemorrhoids: Secondary | ICD-10-CM | POA: Diagnosis present

## 2019-12-30 DIAGNOSIS — Z825 Family history of asthma and other chronic lower respiratory diseases: Secondary | ICD-10-CM

## 2019-12-30 DIAGNOSIS — K5730 Diverticulosis of large intestine without perforation or abscess without bleeding: Secondary | ICD-10-CM | POA: Diagnosis present

## 2019-12-30 DIAGNOSIS — K529 Noninfective gastroenteritis and colitis, unspecified: Secondary | ICD-10-CM

## 2019-12-30 DIAGNOSIS — R011 Cardiac murmur, unspecified: Secondary | ICD-10-CM | POA: Diagnosis present

## 2019-12-30 DIAGNOSIS — N182 Chronic kidney disease, stage 2 (mild): Secondary | ICD-10-CM | POA: Diagnosis present

## 2019-12-30 DIAGNOSIS — M419 Scoliosis, unspecified: Secondary | ICD-10-CM | POA: Diagnosis present

## 2019-12-30 DIAGNOSIS — R109 Unspecified abdominal pain: Secondary | ICD-10-CM | POA: Diagnosis not present

## 2019-12-30 DIAGNOSIS — I952 Hypotension due to drugs: Secondary | ICD-10-CM | POA: Diagnosis not present

## 2019-12-30 DIAGNOSIS — Z8261 Family history of arthritis: Secondary | ICD-10-CM

## 2019-12-30 DIAGNOSIS — I10 Essential (primary) hypertension: Secondary | ICD-10-CM

## 2019-12-30 DIAGNOSIS — Z7951 Long term (current) use of inhaled steroids: Secondary | ICD-10-CM

## 2019-12-30 DIAGNOSIS — Z885 Allergy status to narcotic agent status: Secondary | ICD-10-CM

## 2019-12-30 DIAGNOSIS — K559 Vascular disorder of intestine, unspecified: Secondary | ICD-10-CM

## 2019-12-30 DIAGNOSIS — Z91018 Allergy to other foods: Secondary | ICD-10-CM

## 2019-12-30 DIAGNOSIS — Z9049 Acquired absence of other specified parts of digestive tract: Secondary | ICD-10-CM

## 2019-12-30 DIAGNOSIS — K625 Hemorrhage of anus and rectum: Secondary | ICD-10-CM | POA: Diagnosis not present

## 2019-12-30 DIAGNOSIS — K55039 Acute (reversible) ischemia of large intestine, extent unspecified: Secondary | ICD-10-CM | POA: Diagnosis present

## 2019-12-30 DIAGNOSIS — G894 Chronic pain syndrome: Secondary | ICD-10-CM | POA: Diagnosis present

## 2019-12-30 DIAGNOSIS — Z8249 Family history of ischemic heart disease and other diseases of the circulatory system: Secondary | ICD-10-CM

## 2019-12-30 DIAGNOSIS — R1032 Left lower quadrant pain: Secondary | ICD-10-CM

## 2019-12-30 DIAGNOSIS — Z90710 Acquired absence of both cervix and uterus: Secondary | ICD-10-CM

## 2019-12-30 DIAGNOSIS — T3995XA Adverse effect of unspecified nonopioid analgesic, antipyretic and antirheumatic, initial encounter: Secondary | ICD-10-CM | POA: Diagnosis not present

## 2019-12-30 DIAGNOSIS — D62 Acute posthemorrhagic anemia: Secondary | ICD-10-CM | POA: Diagnosis present

## 2019-12-30 DIAGNOSIS — K5150 Left sided colitis without complications: Secondary | ICD-10-CM | POA: Diagnosis present

## 2019-12-30 DIAGNOSIS — I959 Hypotension, unspecified: Secondary | ICD-10-CM | POA: Diagnosis not present

## 2019-12-30 DIAGNOSIS — Z7982 Long term (current) use of aspirin: Secondary | ICD-10-CM

## 2019-12-30 DIAGNOSIS — K922 Gastrointestinal hemorrhage, unspecified: Secondary | ICD-10-CM | POA: Diagnosis not present

## 2019-12-30 LAB — COMPREHENSIVE METABOLIC PANEL
ALT: 18 U/L (ref 0–44)
AST: 24 U/L (ref 15–41)
Albumin: 4.3 g/dL (ref 3.5–5.0)
Alkaline Phosphatase: 59 U/L (ref 38–126)
Anion gap: 12 (ref 5–15)
BUN: 12 mg/dL (ref 8–23)
CO2: 24 mmol/L (ref 22–32)
Calcium: 9.5 mg/dL (ref 8.9–10.3)
Chloride: 102 mmol/L (ref 98–111)
Creatinine, Ser: 0.96 mg/dL (ref 0.44–1.00)
GFR, Estimated: >60 mL/min (ref >60)
Glucose, Bld: 108 mg/dL — ABNORMAL HIGH (ref 70–99)
Potassium: 3.7 mmol/L (ref 3.5–5.1)
Sodium: 138 mmol/L (ref 135–145)
Total Bilirubin: 0.7 mg/dL (ref 0.3–1.2)
Total Protein: 8.1 g/dL (ref 6.5–8.1)

## 2019-12-30 LAB — URINALYSIS, COMPLETE (UACMP) WITH MICROSCOPIC
Bacteria, UA: NONE SEEN
Bilirubin Urine: NEGATIVE
Glucose, UA: NEGATIVE mg/dL
Hgb urine dipstick: NEGATIVE
Ketones, ur: 5 mg/dL — AB
Leukocytes,Ua: NEGATIVE
Nitrite: NEGATIVE
Protein, ur: 30 mg/dL — AB
Specific Gravity, Urine: 1.023 (ref 1.005–1.030)
pH: 5 (ref 5.0–8.0)

## 2019-12-30 LAB — CBC
HCT: 38.1 % (ref 36.0–46.0)
HCT: 33.7 % — ABNORMAL LOW (ref 36.0–46.0)
Hemoglobin: 12.7 g/dL (ref 12.0–15.0)
Hemoglobin: 11.4 g/dL — ABNORMAL LOW (ref 12.0–15.0)
MCH: 29.9 pg (ref 26.0–34.0)
MCH: 29.9 pg (ref 26.0–34.0)
MCHC: 33.3 g/dL (ref 30.0–36.0)
MCHC: 33.8 g/dL (ref 30.0–36.0)
MCV: 89.6 fL (ref 80.0–100.0)
MCV: 88.5 fL (ref 80.0–100.0)
Platelets: 439 10*3/uL — ABNORMAL HIGH (ref 150–400)
Platelets: 371 10*3/uL (ref 150–400)
RBC: 4.25 MIL/uL (ref 3.87–5.11)
RBC: 3.81 MIL/uL — ABNORMAL LOW (ref 3.87–5.11)
RDW: 13.2 % (ref 11.5–15.5)
RDW: 13.1 % (ref 11.5–15.5)
WBC: 19.2 10*3/uL — ABNORMAL HIGH (ref 4.0–10.5)
WBC: 17.3 10*3/uL — ABNORMAL HIGH (ref 4.0–10.5)
nRBC: 0 % (ref 0.0–0.2)
nRBC: 0 % (ref 0.0–0.2)

## 2019-12-30 LAB — LIPASE, BLOOD: Lipase: 25 U/L (ref 11–51)

## 2019-12-30 LAB — CK: Total CK: 79 U/L (ref 38–234)

## 2019-12-30 LAB — RESPIRATORY PANEL BY RT PCR (FLU A&B, COVID)
Influenza A by PCR: NEGATIVE
Influenza B by PCR: NEGATIVE
SARS Coronavirus 2 by RT PCR: NEGATIVE

## 2019-12-30 LAB — SAMPLE TO BLOOD BANK

## 2019-12-30 LAB — MAGNESIUM: Magnesium: 2.2 mg/dL (ref 1.7–2.4)

## 2019-12-30 LAB — LACTIC ACID, PLASMA: Lactic Acid, Venous: 1.1 mmol/L (ref 0.5–1.9)

## 2019-12-30 MED ORDER — ACETAMINOPHEN 325 MG PO TABS
650.0000 mg | ORAL_TABLET | Freq: Four times a day (QID) | ORAL | Status: DC | PRN
  Administered 2019-12-31 (×2): 650 mg via ORAL
  Filled 2019-12-30 (×2): qty 2

## 2019-12-30 MED ORDER — ONDANSETRON 4 MG PO TBDP
4.0000 mg | ORAL_TABLET | Freq: Once | ORAL | Status: AC | PRN
  Administered 2019-12-30: 4 mg via ORAL
  Filled 2019-12-30: qty 1

## 2019-12-30 MED ORDER — MORPHINE SULFATE (PF) 4 MG/ML IV SOLN
4.0000 mg | Freq: Once | INTRAVENOUS | Status: AC
  Administered 2019-12-30: 4 mg via INTRAVENOUS
  Filled 2019-12-30: qty 1

## 2019-12-30 MED ORDER — HYDROMORPHONE HCL 1 MG/ML IJ SOLN
0.5000 mg | Freq: Once | INTRAMUSCULAR | Status: AC
  Administered 2019-12-30: 0.5 mg via INTRAVENOUS
  Filled 2019-12-30: qty 1

## 2019-12-30 MED ORDER — SODIUM CHLORIDE 0.9 % IV BOLUS
500.0000 mL | Freq: Once | INTRAVENOUS | Status: AC
  Administered 2019-12-30: 500 mL via INTRAVENOUS

## 2019-12-30 MED ORDER — ALBUTEROL SULFATE (2.5 MG/3ML) 0.083% IN NEBU: 2.5000 mg | INHALATION_SOLUTION | Freq: Four times a day (QID) | RESPIRATORY_TRACT | Status: DC | PRN

## 2019-12-30 MED ORDER — AMLODIPINE BESYLATE 5 MG PO TABS
5.0000 mg | ORAL_TABLET | Freq: Every day | ORAL | Status: DC
  Administered 2019-12-31: 5 mg via ORAL
  Filled 2019-12-30: qty 1

## 2019-12-30 MED ORDER — GABAPENTIN 400 MG PO CAPS
800.0000 mg | ORAL_CAPSULE | Freq: Three times a day (TID) | ORAL | Status: DC
  Administered 2019-12-31 – 2020-01-01 (×3): 800 mg via ORAL
  Filled 2019-12-30 (×8): qty 2
  Filled 2019-12-30 (×2): qty 8
  Filled 2019-12-30: qty 2
  Filled 2019-12-30: qty 8
  Filled 2019-12-30 (×7): qty 2

## 2019-12-30 MED ORDER — IOHEXOL 300 MG/ML  SOLN
100.0000 mL | Freq: Once | INTRAMUSCULAR | Status: AC | PRN
  Administered 2019-12-30: 100 mL via INTRAVENOUS

## 2019-12-30 MED ORDER — SODIUM CHLORIDE 0.9 % IV SOLN
75.0000 mL/h | INTRAVENOUS | Status: DC
  Administered 2019-12-31: 75 mL/h via INTRAVENOUS

## 2019-12-30 MED ORDER — PIPERACILLIN-TAZOBACTAM 3.375 G IVPB 30 MIN
3.3750 g | Freq: Once | INTRAVENOUS | Status: AC
  Administered 2019-12-30: 3.375 g via INTRAVENOUS
  Filled 2019-12-30: qty 50

## 2019-12-30 MED ORDER — NORTRIPTYLINE HCL 10 MG PO CAPS
10.0000 mg | ORAL_CAPSULE | Freq: Two times a day (BID) | ORAL | Status: DC
  Administered 2019-12-31 – 2020-01-04 (×8): 10 mg via ORAL
  Filled 2019-12-30 (×12): qty 1

## 2019-12-30 MED ORDER — ACETAMINOPHEN 650 MG RE SUPP: 650.0000 mg | Freq: Four times a day (QID) | RECTAL | Status: DC | PRN

## 2019-12-30 MED ORDER — PANTOPRAZOLE SODIUM 40 MG PO TBEC
40.0000 mg | DELAYED_RELEASE_TABLET | Freq: Every day | ORAL | Status: DC
  Administered 2019-12-31 – 2020-01-04 (×4): 40 mg via ORAL
  Filled 2019-12-30 (×4): qty 1

## 2019-12-30 MED ORDER — VALACYCLOVIR HCL 500 MG PO TABS
1000.0000 mg | ORAL_TABLET | Freq: Every day | ORAL | Status: DC
  Administered 2020-01-03: 1000 mg via ORAL
  Filled 2019-12-30 (×5): qty 2

## 2019-12-30 MED ORDER — IOHEXOL 9 MG/ML PO SOLN: 500.0000 mL | ORAL | Status: AC

## 2019-12-30 MED ORDER — MORPHINE SULFATE (PF) 4 MG/ML IV SOLN: 4.0000 mg | Freq: Once | INTRAVENOUS | Status: AC

## 2019-12-30 MED ORDER — HYDROMORPHONE HCL 1 MG/ML IJ SOLN
0.5000 mg | INTRAMUSCULAR | Status: DC | PRN
  Administered 2019-12-30 – 2020-01-02 (×5): 1 mg via INTRAVENOUS
  Filled 2019-12-30 (×5): qty 1

## 2019-12-30 MED ORDER — MORPHINE SULFATE (PF) 4 MG/ML IV SOLN
INTRAVENOUS | Status: AC
  Administered 2019-12-30: 4 mg via INTRAVENOUS
  Filled 2019-12-30: qty 1

## 2019-12-30 MED ORDER — ONDANSETRON HCL 4 MG/2ML IJ SOLN
4.0000 mg | Freq: Once | INTRAMUSCULAR | Status: AC
  Administered 2019-12-30: 4 mg via INTRAVENOUS
  Filled 2019-12-30: qty 2

## 2019-12-30 MED ORDER — TRAZODONE HCL 50 MG PO TABS
200.0000 mg | ORAL_TABLET | Freq: Every day | ORAL | Status: DC
  Administered 2019-12-31 – 2020-01-03 (×4): 200 mg via ORAL
  Filled 2019-12-30 (×4): qty 4

## 2019-12-30 MED ORDER — MONTELUKAST SODIUM 10 MG PO TABS
10.0000 mg | ORAL_TABLET | Freq: Every day | ORAL | Status: DC
  Administered 2019-12-31 – 2020-01-03 (×4): 10 mg via ORAL
  Filled 2019-12-30 (×6): qty 1

## 2019-12-30 MED ORDER — FLUTICASONE FUROATE-VILANTEROL 100-25 MCG/INH IN AEPB
1.0000 | INHALATION_SPRAY | Freq: Every day | RESPIRATORY_TRACT | Status: DC
  Administered 2019-12-31 – 2020-01-03 (×3): 1 via RESPIRATORY_TRACT
  Filled 2019-12-30: qty 28

## 2019-12-30 MED ORDER — PIPERACILLIN-TAZOBACTAM 3.375 G IVPB
3.3750 g | Freq: Three times a day (TID) | INTRAVENOUS | Status: DC
  Administered 2019-12-31 – 2020-01-04 (×13): 3.375 g via INTRAVENOUS
  Filled 2019-12-30 (×16): qty 50

## 2019-12-30 NOTE — ED Provider Notes (Signed)
[facility] Emergency Department Provider Note ____________________________________________   First MD Initiated Contact with Patient 12/30/19 1507     (approximate)  I have reviewed the triage vital signs and the nursing notes.   HISTORY  Chief Complaint Abdominal Pain    HPI Rachael Jordan is a 64 y.o. female with PMH as noted below who presents with abdominal pain, primarily in the lower abdomen, bilateral, nonradiating, acute onset yesterday evening around 9 PM.  It has persisted since that time, and is somewhat crampy.  The patient reports vomiting and diarrhea initially.  She then developed some blood in the stool today.  She denies any prior history of this pain.  She has had no abdominal surgeries.  She has a family history of diverticulitis, but has not had it herself.  Past Medical History:  Diagnosis Date  . Acid reflux   . Anxiety   . Arthritis   . Bronchitis 09/2015  . Gross hematuria   . H. pylori infection   . Heart murmur   . Kidney failure   . 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 Lung abnormality    "damaged lung due to pneumonia"  . Neck pain   . Nephrolithiasis   . Pneumonia   . Pneumothorax   . Scoliosis   . Urinary frequency   . Vaginal atrophy     Patient Active Problem List   Diagnosis Date Noted  . Colitis 12/30/2019  . Acute nonintractable headache   . Multiple falls 02/20/2019  . Myoclonic jerking 02/20/2019  . COPD (chronic obstructive pulmonary disease) ([place]) 02/20/2019  . Seasonal allergies 02/20/2019  . Insomnia 02/20/2019  . Depression with anxiety 02/20/2019  . AKI (acute kidney injury) ([place]) 02/20/2019  . Dizziness 02/18/2019  . Encounter for screening colonoscopy   . Bunion 05/06/2018  . Metatarsalgia of right foot 05/06/2018  . Age-related osteoporosis without current pathological fracture 03/31/2018  . False positive ana 03/31/2018  . Mouth dryness 02/18/2018  . Screening for osteoporosis  02/18/2018  . 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 Essential hypertension 01/06/2018  . Lacunar infarction ([place]) 01/06/2018  . Compression fracture of thoracic vertebra, sequela 07/12/2017  . Lumbar compression fracture, sequela 07/12/2017  . Nocturnal hypoxia 07/12/2017  . Simple chronic bronchitis ([place]) 07/12/2017  . Intractable back pain 06/05/2017  . Overweight (BMI 25.0-29.9) 04/08/2017  . Chronic pain syndrome 04/06/2017  . Sensory disturbance 01/08/2017  . Pain in the groin, right 10/28/2016  . Paresthesia of both hands 12/14/2015  . B12 deficiency 10/31/2015  . SOB (shortness of breath) 08/29/2015  . History of hematuria 07/12/2015  . Urinary frequency 07/12/2015  . Vaginal atrophy 07/12/2015  . HAV (hallux abducto valgus) 07/02/2015  . Overlapping toe 07/02/2015  . Hammertoe 07/02/2015  . LBP (low back pain) 01/17/2015  . Trochanteric bursitis of right hip 01/17/2015  . PNA (pneumonia) 12/28/2014  . DDD (degenerative disc disease), lumbar 08/15/2014  . Lumbar radiculopathy 08/15/2014  . 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 DDD (degenerative disc disease), cervical 08/15/2014  . Bilateral occipital neuralgia 08/15/2014  . Migraine 08/15/2014  . Sacroiliac joint dysfunction 08/15/2014  . Back pain, chronic 09/23/2013  . Chronic cervical pain 09/23/2013  . Arthralgia of temporomandibular joint 08/29/2013  . Absolute anemia 08/15/2013  . Chronic kidney disease (CKD), stage III (moderate) ([place]) 08/15/2013  . Abnormal serum level of alkaline phosphatase 08/15/2013  . Blush 08/15/2013  . Hematuria, microscopic 08/15/2013  . Inflamed nasal mucosa 08/15/2013  .  Big thyroid 08/15/2013  . Disease of thyroid gland 08/15/2013  . Positive H. pylori test 08/15/2013  . Gastrointestinal ulcer due to Helicobacter pylori 96/22/2979  . Other specified bacterial intestinal infections 08/14/2013  . Kidney failure 08/09/2013  .  SIRS (systemic inflammatory response syndrome) ([place]) 08/09/2013  . Systemic inflammatory response syndrome (SIRS) ([place]) 08/09/2013    Past Surgical History:  Procedure Laterality Date  . ABDOMINAL HYSTERECTOMY     vaginal  . APPENDECTOMY    . COLONOSCOPY WITH PROPOFOL N/A 12/29/2018   Procedure: COLONOSCOPY WITH PROPOFOL;  Surgeon: Lucilla Lame, MD;  Location: [facility] ENDOSCOPY;  Service: Endoscopy;  Laterality: N/A;  . FOOT SURGERY Left    10/2015  . NECK SURGERY      Prior to Admission medications   Medication Sig Start Date End Date Taking? Authorizing Provider  albuterol (VENTOLIN HFA) 108 (90 Base) MCG/ACT inhaler Inhale 2 puffs into the lungs every 6 (six) hours as needed for wheezing or shortness of breath. 10/26/19  Yes Scarboro, Audie Clear, NP  amLODipine (NORVASC) 5 MG tablet Take 5 mg by mouth daily.   Yes [provider]  aspirin EC 81 MG tablet Take 81 mg by mouth daily.   Yes [provider]  azelastine (ASTELIN) 0.1 % nasal spray Place 1-2 sprays into both nostrils 2 (two) times daily.    Yes [provider]  Biotin 10000 MCG TABS Take 10 mg by mouth daily.    Yes [provider]  budesonide-formoterol (SYMBICORT) 80-4.5 MCG/ACT inhaler Inhale 2 puffs into the lungs 2 (two) times daily. 10/26/19  Yes Scarboro, Audie Clear, NP  cetirizine (ZYRTEC) 10 MG tablet Take 10 mg by mouth at bedtime.    Yes [provider]  Cholecalciferol (VITAMIN D-1000 MAX ST) 25 MCG (1000 UT) tablet Take 1,000 Units by mouth daily.    Yes [provider]  montelukast (SINGULAIR) 10 MG tablet Take 10 mg by mouth at bedtime.    Yes [provider]  Multiple Vitamins-Minerals (CENTRUM ADULTS PO) Take 1 tablet by mouth daily.    Yes [provider]  nortriptyline (PAMELOR) 10 MG capsule Take 10 mg by mouth 2 (two) times daily. 12/29/19  Yes [provider]  olmesartan (BENICAR) 40 MG tablet Take 40 mg by mouth daily. 11/23/19  Yes  [provider]  pantoprazole (PROTONIX) 40 MG tablet Take 40 mg by mouth daily.   Yes [provider]  pyridOXINE (VITAMIN B-6) 100 MG tablet Take 100 mg by mouth daily.   Yes [provider]  rizatriptan (MAXALT) 10 MG tablet Take 10 mg by mouth as needed for migraine. May repeat in 2 hours if needed   Yes [provider]  tiZANidine (ZANAFLEX) 4 MG tablet Take 4 mg by mouth 4 (four) times daily as needed for muscle spasms.    Yes [provider]  traZODone (DESYREL) 100 MG tablet Take 200 mg by mouth at bedtime.    Yes [provider]  valACYclovir (VALTREX) 1000 MG tablet Take 1,000 mg by mouth daily.    Yes [provider]  vitamin C (ASCORBIC ACID) 500 MG tablet Take 500 mg by mouth daily.   Yes [provider]  Vitamin E 45 MG CAPS Take 90 mg by mouth daily.   Yes [provider]  albuterol (PROVENTIL) (2.5 MG/3ML) 0.083% nebulizer solution Take 2.5 mg by nebulization every 6 (six) hours as needed for wheezing or shortness of breath.    [provider]  gabapentin (NEURONTIN) 800 MG tablet Take 800 mg by mouth 3 (three) times daily.  12/03/19   [provider]  morphine (MSIR) 15 MG tablet Take 15 mg by mouth 2 (two) times daily as needed. 12/29/19   [provider]  oxycodone-acetaminophen (LYNOX) 10-300 MG tablet Take 1 tablet by mouth every 4 (four) hours as needed for pain. Patient not taking: Reported on 12/30/2019    [provider]  Trospium Chloride 60 MG CP24 Take 1 capsule (60 mg total) by mouth daily. Patient not taking: Reported on 12/30/2019 12/02/19   Zara Council A, PA-C  venlafaxine XR (EFFEXOR-XR) 75 MG 24 hr capsule Take 37.5 mg by mouth daily.  Patient not taking: Reported on 11/10/2019 06/19/18 11/10/19  [provider]    Allergies Grapefruit extract, Oxycodone, and Propoxyphene  Family History  Problem Relation Age of Onset  . Arthritis  Mother   . Asthma Mother   . Hyperlipidemia Mother   . Hypertension Mother   . Diabetes Sister   . Cancer Maternal Aunt        esophagus  . Breast cancer Neg Hx   . Kidney cancer Neg Hx   . Bladder Cancer Neg Hx     Social History Social History   Tobacco Use  . Smoking status: Never Smoker  . Smokeless tobacco: Never Used  Vaping Use  . Vaping Use: Never used  Substance Use Topics  . Alcohol use: No    Alcohol/week: 0.0 standard drinks  . Drug use: No    Review of Systems  Constitutional: No fever.  Positive for chills. Eyes: No redness. ENT: No sore throat. Cardiovascular: Denies chest pain. Respiratory: Denies shortness of breath. Gastrointestinal: Positive for vomiting and diarrhea. Genitourinary: Negative for dysuria or frequency.  Musculoskeletal: Negative for back pain. Skin: Negative for rash. Neurological: Negative for headache.   ____________________________________________   PHYSICAL EXAM:  VITAL SIGNS: ED Triage Vitals  Enc Vitals Group     BP 12/30/19 1209 (!) 150/74     Pulse Rate 12/30/19 1209 96     Resp 12/30/19 1209 20     Temp 12/30/19 1209 98.9 F (37.2 C)     Temp Source 12/30/19 1209 Oral     SpO2 12/30/19 1209 99 %     Weight 12/30/19 1210 153 lb (69.4 kg)     Height 12/30/19 1210 5' 5"  (1.651 m)     Head Circumference --      Peak Flow --      Pain Score 12/30/19 1210 10     Pain Loc --      Pain Edu? --      Excl. in [place]? --     Constitutional: Alert and oriented.  Uncomfortable appearing but in no acute distress. Eyes: Conjunctivae are normal.  No scleral icterus. Head: Atraumatic. Nose: No congestion/rhinnorhea. Mouth/Throat: Mucous membranes are moist.   Neck: Normal range of motion.  Cardiovascular: Normal rate, regular rhythm.   Good peripheral circulation. Respiratory: Normal respiratory effort.  No retractions.  Gastrointestinal: Soft with mild bilateral lower abdominal discomfort but no focal tenderness.  No  distention.  Genitourinary: No flank tenderness. Musculoskeletal: Extremities warm and well perfused.  Neurologic:  Normal speech and language. No gross focal neurologic deficits are appreciated.  Skin:  Skin is warm and dry. No rash noted. Psychiatric: Mood and affect are normal. Speech and behavior are normal.  ____________________________________________   LABS (all labs ordered are listed, but only abnormal results  are displayed)  Labs Reviewed  COMPREHENSIVE METABOLIC PANEL - Abnormal; Notable for the following components:      Result Value   Glucose, Bld 108 (*)    All other components within normal limits  CBC - Abnormal; Notable for the following components:   WBC 19.2 (*)    Platelets 439 (*)    All other components within normal limits  URINALYSIS, COMPLETE (UACMP) WITH MICROSCOPIC - Abnormal; Notable for the following components:   Color, Urine YELLOW (*)    APPearance HAZY (*)    Ketones, ur 5 (*)    Protein, ur 30 (*)    All other components within normal limits  CBC - Abnormal; Notable for the following components:   WBC 17.3 (*)    RBC 3.81 (*)    Hemoglobin 11.4 (*)    HCT 33.7 (*)    All other components within normal limits  RESPIRATORY PANEL BY RT PCR (FLU A&B, COVID)  C DIFFICILE QUICK SCREEN W PCR REFLEX  GASTROINTESTINAL PANEL BY PCR, STOOL (REPLACES STOOL CULTURE)  LIPASE, BLOOD  LACTIC ACID, PLASMA  MAGNESIUM  CK  CBC  CBC  MAGNESIUM  PHOSPHORUS  CBC WITH DIFFERENTIAL/PLATELET  TSH  COMPREHENSIVE METABOLIC PANEL  SAMPLE TO BLOOD BANK   ____________________________________________  EKG   ____________________________________________  RADIOLOGY  CT abdomen/pelvis: Colitis  ____________________________________________   PROCEDURES  Procedure(s) performed: No  Procedures  Critical Care performed: No ____________________________________________   INITIAL IMPRESSION / ASSESSMENT AND PLAN / ED COURSE  Pertinent labs &  imaging results that were available during my care of the patient were reviewed by me and considered in my medical decision making (see chart for details).  64 year old female with PMH as noted above presents with lower abdominal pain since last night associated with vomiting and diarrhea, and some blood in the stool today.  She denies any prior history of similar pain.  I reviewed the past medical records in [place].  The patient was most recently seen in the ED in April for chest pain, and in March for back pain.  She has had no ED visits or admissions since then.  On exam the patient is uncomfortable but overall well-appearing.  Her vital signs are normal except for borderline elevated blood pressure and heart rate.  The abdomen is soft with mild discomfort to the bilateral lower quadrants but no focal tenderness or peritoneal signs.  The remainder of the exam is as described above.  Differential includes diverticulitis, colitis, gastroenteritis, UTI/cystitis, or less likely small bowel obstruction, volvulus, appendicitis, or hepatobiliary cause.  Initial lab work-up is significant for an elevated WBC count.  We will obtain a CT for further evaluation, and give analgesia, antiemetic, and fluids.  ----------------------------------------- 7:37 PM on 12/30/2019 -----------------------------------------  CT shows colitis.  Given the patient's elevated white blood cell count, relatively severe pain, and persistent vomiting, she would benefit from IV antibiotics and inpatient admission.  She agrees with this plan.  I discussed the case with Dr. Roel Cluck from the hospitalist service.  ____________________________________________   FINAL CLINICAL IMPRESSION(S) / ED DIAGNOSES  Final diagnoses:  Colitis      NEW MEDICATIONS STARTED DURING THIS VISIT:  New Prescriptions   No medications on file     Note:  This document was prepared using Dragon voice recognition software and may include  unintentional dictation errors.   Arta Silence, MD 12/30/19 [phone]

## 2019-12-30 NOTE — ED Notes (Signed)
Pt assisted to the bathroom.

## 2019-12-30 NOTE — ED Notes (Signed)
Pt with sats dropping to mid-70s while asleep; recovered without intervention. Pt states she wears oxygen at home at night while she sleeps (2-3L per [place]). Pt placed on supplemental O2 @2  L per [place]

## 2019-12-30 NOTE — ED Triage Notes (Signed)
Pt in via POV, reports sudden onset N/V/D last night w/ severe generalized abdominal pain, noticing bright red blood in stools this am.  Pt appears uncomfortable in triage.  Vitals WDL.

## 2019-12-30 NOTE — ED Notes (Signed)
Stool collected and sent. Lav sent to lab

## 2019-12-30 NOTE — H&P (Signed)
Rachael Jordan KNL:[id number] DOB: 11/08/1955 DOA: 12/30/2019     PCP: Sharyne Peach, MD   Outpatient Specialists:    GI  Dr. Alice Reichert    Patient arrived to ER on 12/30/19 at 1029 Referred by Attending Arta Silence, MD   Patient coming from: home Lives   With family    Chief Complaint:   Chief Complaint  Patient presents with  . Abdominal Pain    HPI: Rachael Jordan is a 64 y.o. female with medical history significant of GERD, vitamin B12 deficiency chronic neck pain, heart murmur, history of H. pylori status post appendectomy and hysterectomy, HTN, HLD, asthma, GERD    Presented with sudden onset N/V/D last night w/ severe generalized abdominal pain,  Tonight she developed BRBPR and came to ER  Last week she had exposure to Rachael Jordan (her son tested positive) and seen her PCP with cough, chills, and body aches, She tested negative patient is fully vaccinated  This Am BRBPR looked like cranberry sauce No prior episods Feeling lightheaded  She ate vegetable soup and cheesecake and after that her symptoms started  Infectious risk factors:  Reports  N/V/Diarrhea/abdominal pain,    severe fatigue Reports known sick contacts, known COVID 19 exposure, inability to completely isolate    Has  been vaccinated against COVID    Initial COVID TEST  NEGATIVE   Lab Results  Component Value Date   [place] 12/30/2019   [place] NEGATIVE 02/18/2019   [place] NEGATIVE 12/25/2018    Regarding pertinent Chronic problems:       HTN on   Norvasc, Benicar   Asthma -well    controlled on home inhalers    Chronic anemia - baseline hg Hemoglobin & Hematocrit  Recent Labs    06/04/19 1130 06/18/19 1456 12/30/19 1217  HGB 10.6* 10.6* 12.7   While in ER: Hemoglobin stable White blood cell count 19.2 Ct abdomen showing Long segment Acute Colitis from the splenic flexure to the sigmoid Colon.   Hospitalist was called for admission for  Colitis  The following Work up has been ordered so far:  Orders Placed This Encounter  Procedures  . C Difficile Quick Screen w PCR reflex  . Gastrointestinal Panel by PCR , Stool  . Respiratory Panel by RT PCR (Flu A&B, Covid) - Nasopharyngeal Swab  . CT ABDOMEN PELVIS W CONTRAST  . Lipase, blood  . Comprehensive metabolic panel  . CBC  . Urinalysis, Complete w Microscopic  . Diet NPO time specified  . Consult to hospitalist  5901  . Enteric precautions (UV disinfection) C difficile, Norovirus  . Sample to Blood Bank    Following Medications were ordered in ER: Medications  iohexol (OMNIPAQUE) 9 MG/ML oral solution 500 mL (has no administration in time range)  piperacillin-tazobactam (ZOSYN) IVPB 3.375 g (has no administration in time range)  ondansetron (ZOFRAN-ODT) disintegrating tablet 4 mg (4 mg Oral Given 12/30/19 1218)  sodium chloride 0.9 % bolus 500 mL (0 mLs Intravenous Stopped 12/30/19 1850)  ondansetron (ZOFRAN) injection 4 mg (4 mg Intravenous Given 12/30/19 1629)  morphine 4 MG/ML injection 4 mg (4 mg Intravenous Given 12/30/19 1630)  iohexol (OMNIPAQUE) 300 MG/ML solution 100 mL (100 mLs Intravenous Contrast Given 12/30/19 1648)  morphine 4 MG/ML injection 4 mg (4 mg Intravenous Given 12/30/19 1851)        Consult Orders  (From admission, onward)         Start     Ordered   12/30/19 1857  Consult to hospitalist  5901  Once       Comments: [id number]  Provider:  (Not yet assigned)  Question Answer Comment  Place call to: Hospitalist   Reason for Consult Admit   Diagnosis/Clinical Info for Consult: Colitis      12/30/19 1858          Significant initial  Findings: Abnormal Labs Reviewed  COMPREHENSIVE METABOLIC PANEL - Abnormal; Notable for the following components:      Result Value   Glucose, Bld 108 (*)    All other components within normal limits  CBC - Abnormal; Notable for the following components:   WBC 19.2 (*)    Platelets 439 (*)    All  other components within normal limits  URINALYSIS, COMPLETE (UACMP) WITH MICROSCOPIC - Abnormal; Notable for the following components:   Color, Urine YELLOW (*)    APPearance HAZY (*)    Ketones, ur 5 (*)    Protein, ur 30 (*)    All other components within normal limits    Otherwise labs showing:   Recent Labs  Lab 12/30/19 1217  NA 138  K 3.7  CO2 24  GLUCOSE 108*  BUN 12  CREATININE 0.96  CALCIUM 9.5    Cr    Stable  Lab Results  Component Value Date   CREATININE 0.96 12/30/2019   CREATININE 1.18 (H) 06/18/2019   CREATININE 0.99 06/04/2019    Recent Labs  Lab 12/30/19 1217  AST 24  ALT 18  ALKPHOS 59  BILITOT 0.7  PROT 8.1  ALBUMIN 4.3   Lab Results  Component Value Date   CALCIUM 9.5 12/30/2019   PHOS 3.9 01/01/2019      WBC      Component Value Date/Time   WBC 19.2 (H) 12/30/2019 1217   LYMPHSABS 2.0 02/18/2019 1406   LYMPHSABS 2.3 07/31/2013 0457   MONOABS 0.7 02/18/2019 1406   MONOABS 1.6 (H) 07/31/2013 0457   EOSABS 0.2 02/18/2019 1406   EOSABS 0.1 07/31/2013 0457   BASOSABS 0.1 02/18/2019 1406   BASOSABS 0.0 07/31/2013 0457    Plt: Lab Results  Component Value Date   PLT 439 (H) 12/30/2019     Lactic Acid, Venous    Component Value Date/Time   LATICACIDVEN 1.1 12/30/2019 1629      HG/HCT   Stable,     Component Value Date/Time   HGB 12.7 12/30/2019 1217   HGB 11.2 (L) 06/27/2014 1414   HCT 38.1 12/30/2019 1217   HCT 34.8 (L) 06/27/2014 1414   MCV 89.6 12/30/2019 1217   MCV 91 06/27/2014 1414    Recent Labs  Lab 12/30/19 1217  LIPASE 25   No results for input(s): AMMONIA in the last 168 hours.      ECG: not  Ordered     DM  labs:  HbA1C: Recent Labs    02/19/19 0516  HGBA1C 5.5     UA   no evidence of UTI     Urine analysis:    Component Value Date/Time   COLORURINE YELLOW (A) 12/30/2019 1217   APPEARANCEUR HAZY (A) 12/30/2019 1217   APPEARANCEUR Clear 12/02/2019 0855   LABSPEC 1.023 12/30/2019 1217    LABSPEC 1.009 06/27/2014 1414   PHURINE 5.0 12/30/2019 [place] 12/30/2019 1217   GLUCOSEU Negative 06/27/2014 [place] 12/30/2019 [place] 12/30/2019 1217   BILIRUBINUR Negative 12/02/2019 0855   BILIRUBINUR Negative 06/27/2014 1414   KETONESUR  5 (A) 12/30/2019 1217   PROTEINUR 30 (A) 12/30/2019 1217   NITRITE NEGATIVE 12/30/2019 1217   LEUKOCYTESUR NEGATIVE 12/30/2019 1217   LEUKOCYTESUR Negative 06/27/2014 1414      Ordered   CTabd/pelvis - Long segment Acute Colitis from the splenic flexure to the sigmoid colon.     ED Triage Vitals  Enc Vitals Group     BP 12/30/19 1209 (!) 150/74     Pulse Rate 12/30/19 1209 96     Resp 12/30/19 1209 20     Temp 12/30/19 1209 98.9 F (37.2 C)     Temp Source 12/30/19 1209 Oral     SpO2 12/30/19 1209 99 %     Weight 12/30/19 1210 153 lb (69.4 kg)     Height 12/30/19 1210 5' 5"  (1.651 m)     Head Circumference --      Peak Flow --      Pain Score 12/30/19 1210 10     Pain Loc --      Pain Edu? --      Excl. in [place]? --   TMAX(24)@    Latest  Blood pressure (!) 154/96, pulse 100, temperature 98.9 F (37.2 C), temperature source Oral, resp. rate 20, height 5' 5"  (1.651 m), weight 69.4 kg, SpO2 100 %.    Review of Systems:    Pertinent positives include:  fatigue, , indigestion, abdominal pain,  blood in stool,  Constitutional:  No weight loss, night sweats, Fevers, chills,  weight loss  HEENT:  No headaches, Difficulty swallowing,Tooth/dental problems,Sore throat,  No sneezing, itching, ear ache, nasal congestion, post nasal drip,  Cardio-vascular:  No chest pain, Orthopnea, PND, anasarca, dizziness, palpitations.no Bilateral lower extremity swelling  GI:  No heartburnnausea, vomiting, diarrhea, change in bowel habits, loss of appetite, melena,hematemesis Resp:  no shortness of breath at rest. No dyspnea on exertion, No excess mucus, no productive cough, No non-productive cough, No  coughing up of blood.No change in color of mucus.No wheezing. Skin:  no rash or lesions. No jaundice GU:  no dysuria, change in color of urine, no urgency or frequency. No straining to urinate.  No flank pain.  Musculoskeletal:  No joint pain or no joint swelling. No decreased range of motion. No back pain.  Psych:  No change in mood or affect. No depression or anxiety. No memory loss.  Neuro: no localizing neurological complaints, no tingling, no weakness, no double vision, no gait abnormality, no slurred speech, no confusion  All systems reviewed and apart from [place] all are negative  Past Medical History:   Past Medical History:  Diagnosis Date  . Acid reflux   . Anxiety   . Arthritis   . Bronchitis 09/2015  . Gross hematuria   . H. pylori infection   . Heart murmur   . Kidney failure   . 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 Lung abnormality    "damaged lung due to pneumonia"  . Neck pain   . Nephrolithiasis   . Pneumonia   . Pneumothorax   . Scoliosis   . Urinary frequency   . Vaginal atrophy       Past Surgical History:  Procedure Laterality Date  . ABDOMINAL HYSTERECTOMY     vaginal  . APPENDECTOMY    . COLONOSCOPY WITH PROPOFOL N/A 12/29/2018   Procedure: COLONOSCOPY WITH PROPOFOL;  Surgeon: Lucilla Lame, MD;  Location: [facility] ENDOSCOPY;  Service: Endoscopy;  Laterality: N/A;  . FOOT SURGERY Left    10/2015  .  NECK SURGERY      Social History:  Ambulatory  Independently      reports that she has never smoked. She has never used smokeless tobacco. She reports that she does not drink alcohol and does not use drugs.   Family History:   Family History  Problem Relation Age of Onset  . Arthritis Mother   . Asthma Mother   . Hyperlipidemia Mother   . Hypertension Mother   . Diabetes Sister   . Cancer Maternal Aunt        esophagus  . Breast cancer Neg Hx   . Kidney cancer Neg Hx   . Bladder Cancer Neg Hx     Allergies: Allergies   Allergen Reactions  . Grapefruit Extract Rash  . Oxycodone Itching  . Propoxyphene Nausea And Vomiting and Nausea Only     Prior to Admission medications   Medication Sig Start Date End Date Taking? Authorizing Provider  albuterol (PROVENTIL) (2.5 MG/3ML) 0.083% nebulizer solution Take 2.5 mg by nebulization every 6 (six) hours as needed for wheezing or shortness of breath.    [provider]  albuterol (VENTOLIN HFA) 108 (90 Base) MCG/ACT inhaler Inhale 2 puffs into the lungs every 6 (six) hours as needed for wheezing or shortness of breath. 10/26/19   Kendell Bane, NP  amLODipine (NORVASC) 5 MG tablet Take 5 mg by mouth daily.    [provider]  aspirin EC 81 MG tablet Take 81 mg by mouth daily.    [provider]  azelastine (ASTELIN) 0.1 % nasal spray Place 1-2 sprays into both nostrils 2 (two) times daily.     [provider]  Biotin 10000 MCG TABS Take 10 mg by mouth daily.     [provider]  budesonide-formoterol (SYMBICORT) 80-4.5 MCG/ACT inhaler Inhale 2 puffs into the lungs 2 (two) times daily. 10/26/19   Kendell Bane, NP  cetirizine (ZYRTEC) 10 MG tablet Take 10 mg by mouth at bedtime.     [provider]  Cholecalciferol (VITAMIN D-1000 MAX ST) 25 MCG (1000 UT) tablet Take 1,000 Units by mouth daily.     [provider]  gabapentin (NEURONTIN) 800 MG tablet Take 800 mg by mouth 3 (three) times daily.  12/03/19   [provider]  montelukast (SINGULAIR) 10 MG tablet Take 10 mg by mouth at bedtime.     [provider]  morphine (MSIR) 15 MG tablet Take 15 mg by mouth 2 (two) times daily as needed. 12/29/19   [provider]  Multiple Vitamins-Minerals (CENTRUM ADULTS PO) Take 1 tablet by mouth daily.     [provider]  nortriptyline (PAMELOR) 10 MG capsule Take 10 mg by mouth 2 (two) times daily. 12/29/19   [provider]  olmesartan (BENICAR) 40 MG tablet Take 40  mg by mouth daily. 11/23/19   [provider]  oxycodone-acetaminophen (LYNOX) 10-300 MG tablet Take 1 tablet by mouth every 4 (four) hours as needed for pain.    [provider]  pantoprazole (PROTONIX) 40 MG tablet Take 40 mg by mouth daily.    [provider]  pyridOXINE (VITAMIN B-6) 100 MG tablet Take 100 mg by mouth daily.    [provider]  rizatriptan (MAXALT) 10 MG tablet Take 10 mg by mouth as needed for migraine. May repeat in 2 hours if needed    [provider]  tiZANidine (ZANAFLEX) 4 MG tablet Take 4 mg by mouth 4 (four) times  daily as needed for muscle spasms.     [provider]  traZODone (DESYREL) 100 MG tablet Take 200 mg by mouth at bedtime.     [provider]  Trospium Chloride 60 MG CP24 Take 1 capsule (60 mg total) by mouth daily. 12/02/19   McGowan, Hunt Oris, PA-C  valACYclovir (VALTREX) 1000 MG tablet Take 1,000 mg by mouth daily.     [provider]  venlafaxine XR (EFFEXOR-XR) 75 MG 24 hr capsule Take 37.5 mg by mouth daily.  Patient not taking: Reported on 11/10/2019 06/19/18 11/10/19  [provider]  vitamin C (ASCORBIC ACID) 500 MG tablet Take 500 mg by mouth daily.    [provider]  Vitamin E 45 MG CAPS Take 90 mg by mouth daily.    [provider]   Physical Exam: Vitals with BMI 12/30/2019 12/30/2019 12/30/2019  Height - - -  Weight - - -  BMI - - -  Systolic 462 703 500  Diastolic 96 98 80  Pulse 938 96 101     1. General:  in No  Acute distress   Chronically ill  -appearing 2. Psychological: Alert and  Oriented 3. Head/ENT:  Dry Mucous Membranes                          Head Non traumatic, neck supple                           Poor Dentition 4. SKIN:  decreased Skin turgor,  Skin clean Dry and intact no rash 5. Heart: Regular rate and rhythm no  Murmur, no Rub or gallop 6. Lungs:  no wheezes or crackles   7. Abdomen: Soft,  tender, Non distended   Obese bowel sounds present 8. Lower extremities: no clubbing, cyanosis, no  edema 9. Neurologically Grossly intact, moving all 4 extremities equally  10. MSK: Normal range of motion   All other LABS:     Recent Labs  Lab 12/30/19 1217  WBC 19.2*  HGB 12.7  HCT 38.1  MCV 89.6  PLT 439*     Recent Labs  Lab 12/30/19 1217  NA 138  K 3.7  CL 102  CO2 24  GLUCOSE 108*  BUN 12  CREATININE 0.96  CALCIUM 9.5     Recent Labs  Lab 12/30/19 1217  AST 24  ALT 18  ALKPHOS 59  BILITOT 0.7  PROT 8.1  ALBUMIN 4.3       Cultures: No results found for: SDES, SPECREQUEST, CULT, REPTSTATUS   Radiological Exams on Admission: CT ABDOMEN PELVIS W CONTRAST  Result Date: 12/30/2019 CLINICAL DATA:  64 year old female with sudden onset abdominal pain, nausea vomiting diarrhea last night. Bright red blood in stool this morning. EXAM: CT ABDOMEN AND PELVIS WITH CONTRAST TECHNIQUE: Multidetector CT imaging of the abdomen and pelvis was performed using the standard protocol following bolus administration of intravenous contrast. CONTRAST:  152m OMNIPAQUE IOHEXOL 300 MG/ML  SOLN COMPARISON:  Noncontrast CT Abdomen and Pelvis 05/13/2019 and earlier. FINDINGS: Lower chest: Stable mild lower lobe scarring. No cardiomegaly. No pericardial or pleural effusion. Hepatobiliary: Negative liver and gallbladder. Pancreas: Negative. Spleen: Negative. Adrenals/Urinary Tract: Normal adrenal glands. Bilateral renal enhancement and contrast excretion is symmetric and within normal limits. Normal proximal ureters. Mildly distended but otherwise unremarkable urinary bladder. No nephrolithiasis. Stomach/Bowel: The rectum is decompressed and relatively negative. Beginning in the mid sigmoid colon  there is circumferential large bowel wall thickening, which becomes moderate to severe in the descending colon and is accompanied by long segment pericolonic mesenteric stranding (series 2, image 47). The changes persist  through the splenic flexure. The mid transverse colon begins to normalize, and the proximal transverse appears normal on series 2, image 32. Negative right colon. Oral contrast has reached the ascending colon. Diminutive or absent appendix. Negative terminal ileum. No dilated small bowel. Negative stomach and duodenum. No free air. No free fluid. No other mesenteric stranding. Vascular/Lymphatic: Major arterial structures are patent. Minimal atherosclerosis. Portal venous system is patent. No lymphadenopathy. Reproductive: Absent uterus.  Diminutive or absent ovaries. Other: No pelvic free fluid. Musculoskeletal: Stable mild T12 compression fracture. Chronic disc and endplate degeneration at the lumbosacral junction. No acute osseous abnormality identified. IMPRESSION: Long segment Acute Colitis from the splenic flexure to the sigmoid colon. No perforation, obstruction, or other complicating features. Electronically Signed   By: Genevie Ann M.D.   On: 12/30/2019 17:31    Chart has been reviewed    Assessment/Plan   64 y.o. female with medical history significant of GERD, vitamin B12 deficiency chronic neck pain, heart murmur, history of H. pylori status post appendectomy and hysterectomy, HTN, HLD, asthma, GERD  Admitted for colitis  Present on Admission: . Colitis -continue Zosyn order gastric panel, bowel rest for tonight,  Bright red blood per rectum -most likely secondary to colitis.  Monitor serial CBC. Hemoglobin stable.  Will notify GI patient's been admitted  . Essential hypertension -allow permissive hypertension for tonight hold home medication  . Chronic pain syndrome -pain control as blood pressure able to tolerate   Other plan as per orders.  DVT prophylaxis:  SCD       Code Status:    Code Status: Prior FULL CODE  as per patient   I had personally discussed CODE STATUS with patient      Family Communication:   Family not at  Bedside   Disposition Plan:       To home once  workup is complete and patient is stable   Following barriers for discharge:                                                          Pain controlled with PO medications                                 white count improving able to transition to PO antibiotics                             Will need to be able to tolerate PO                                       Consults called: Gi notified (dr. Marius Ditch) through smart chat   Admission status:  ED Disposition    ED Disposition Condition [place]: [place] [[id number]]  Level of Care: Med-Surg [16]  Covid Evaluation: Symptomatic Person Under Investigation (PUI)  Diagnosis: Colitis [[id number]]  Admitting Physician: Toy Baker [  [place]  Attending Physician: Toy Baker [[id number]]  Estimated length of stay: past midnight tomorrow  Certification:: I certify this patient will need inpatient services for at least 2 midnights           inpatient     I Expect 2 midnight stay secondary to severity of patient's current illness need for inpatient interventions justified by the following:   Severe lab/radiological/exam abnormalities including:    colitis  and extensive comorbidities including:  Chronic pain  COPD/asthma   That are currently affecting medical management.   I expect  patient to be hospitalized for 2 midnights requiring inpatient medical care.  Patient is at high risk for adverse outcome (such as loss of life or disability) if not treated.  Indication for inpatient stay as follows:    severe pain requiring acute inpatient management,  inability to maintain oral hydration    Need for IV antibiotics, IV fluids,   Level of care     tele  24H          Lab Results  Component Value Date   [place] NEGATIVE 12/30/2019     Precautions: admitted as  Covid Negative      PPE: Used by the provider:   P100  eye Goggles,  Gloves      Jossette Zirbel 12/30/2019, 8:33  PM    [facility]     after 2 AM please page floor coverage PA If 7AM-7PM, please contact the day team taking care of the patient using [email or web address]   Patient was evaluated in the context of the global COVID-19 pandemic, which necessitated consideration that the patient might be at risk for infection with the SARS-CoV-2 virus that causes COVID-19. Institutional protocols and algorithms that pertain to the evaluation of patients at risk for COVID-19 are in a state of rapid change based on information released by regulatory bodies including the CDC and federal and state organizations. These policies and algorithms were followed during the patient's care.

## 2019-12-31 ENCOUNTER — Inpatient Hospital Stay: Payer: 59

## 2019-12-31 DIAGNOSIS — G894 Chronic pain syndrome: Secondary | ICD-10-CM

## 2019-12-31 DIAGNOSIS — K529 Noninfective gastroenteritis and colitis, unspecified: Secondary | ICD-10-CM | POA: Diagnosis not present

## 2019-12-31 DIAGNOSIS — R109 Unspecified abdominal pain: Secondary | ICD-10-CM

## 2019-12-31 DIAGNOSIS — I959 Hypotension, unspecified: Secondary | ICD-10-CM | POA: Diagnosis not present

## 2019-12-31 DIAGNOSIS — K922 Gastrointestinal hemorrhage, unspecified: Secondary | ICD-10-CM

## 2019-12-31 DIAGNOSIS — R1032 Left lower quadrant pain: Secondary | ICD-10-CM

## 2019-12-31 LAB — CBC WITH DIFFERENTIAL/PLATELET
Abs Immature Granulocytes: 0.05 10*3/uL (ref 0.00–0.07)
Basophils Absolute: 0.1 10*3/uL (ref 0.0–0.1)
Basophils Relative: 0 %
Eosinophils Absolute: 0.2 10*3/uL (ref 0.0–0.5)
Eosinophils Relative: 1 %
HCT: 35.3 % — ABNORMAL LOW (ref 36.0–46.0)
Hemoglobin: 11.4 g/dL — ABNORMAL LOW (ref 12.0–15.0)
Immature Granulocytes: 0 %
Lymphocytes Relative: 19 %
Lymphs Abs: 3 10*3/uL (ref 0.7–4.0)
MCH: 29.5 pg (ref 26.0–34.0)
MCHC: 32.3 g/dL (ref 30.0–36.0)
MCV: 91.5 fL (ref 80.0–100.0)
Monocytes Absolute: 1 10*3/uL (ref 0.1–1.0)
Monocytes Relative: 6 %
Neutro Abs: 11.3 10*3/uL — ABNORMAL HIGH (ref 1.7–7.7)
Neutrophils Relative %: 74 %
Platelets: 365 10*3/uL (ref 150–400)
RBC: 3.86 MIL/uL — ABNORMAL LOW (ref 3.87–5.11)
RDW: 13.2 % (ref 11.5–15.5)
WBC: 15.6 10*3/uL — ABNORMAL HIGH (ref 4.0–10.5)
nRBC: 0 % (ref 0.0–0.2)

## 2019-12-31 LAB — GASTROINTESTINAL PANEL BY PCR, STOOL (REPLACES STOOL CULTURE)

## 2019-12-31 LAB — C DIFFICILE QUICK SCREEN W PCR REFLEX
C Diff antigen: NEGATIVE
C Diff interpretation: NOT DETECTED
C Diff toxin: NEGATIVE

## 2019-12-31 LAB — COMPREHENSIVE METABOLIC PANEL
ALT: 14 U/L (ref 0–44)
AST: 19 U/L (ref 15–41)
Albumin: 3.7 g/dL (ref 3.5–5.0)
Alkaline Phosphatase: 54 U/L (ref 38–126)
Anion gap: 7 (ref 5–15)
BUN: 9 mg/dL (ref 8–23)
CO2: 27 mmol/L (ref 22–32)
Calcium: 8.7 mg/dL — ABNORMAL LOW (ref 8.9–10.3)
Chloride: 104 mmol/L (ref 98–111)
Creatinine, Ser: 1.01 mg/dL — ABNORMAL HIGH (ref 0.44–1.00)
GFR, Estimated: 60 mL/min — ABNORMAL LOW (ref >60)
Glucose, Bld: 104 mg/dL — ABNORMAL HIGH (ref 70–99)
Potassium: 3.9 mmol/L (ref 3.5–5.1)
Sodium: 138 mmol/L (ref 135–145)
Total Bilirubin: 0.7 mg/dL (ref 0.3–1.2)
Total Protein: 6.9 g/dL (ref 6.5–8.1)

## 2019-12-31 LAB — CBC
HCT: 34.4 % — ABNORMAL LOW (ref 36.0–46.0)
HCT: 36.1 % (ref 36.0–46.0)
HCT: 34.5 % — ABNORMAL LOW (ref 36.0–46.0)
Hemoglobin: 11.2 g/dL — ABNORMAL LOW (ref 12.0–15.0)
Hemoglobin: 11.8 g/dL — ABNORMAL LOW (ref 12.0–15.0)
Hemoglobin: 11.2 g/dL — ABNORMAL LOW (ref 12.0–15.0)
MCH: 29.7 pg (ref 26.0–34.0)
MCH: 30.3 pg (ref 26.0–34.0)
MCH: 29.7 pg (ref 26.0–34.0)
MCHC: 32.6 g/dL (ref 30.0–36.0)
MCHC: 32.7 g/dL (ref 30.0–36.0)
MCHC: 32.5 g/dL (ref 30.0–36.0)
MCV: 91.2 fL (ref 80.0–100.0)
MCV: 92.8 fL (ref 80.0–100.0)
MCV: 91.5 fL (ref 80.0–100.0)
Platelets: 378 10*3/uL (ref 150–400)
Platelets: 337 10*3/uL (ref 150–400)
Platelets: 339 10*3/uL (ref 150–400)
RBC: 3.77 MIL/uL — ABNORMAL LOW (ref 3.87–5.11)
RBC: 3.89 MIL/uL (ref 3.87–5.11)
RBC: 3.77 MIL/uL — ABNORMAL LOW (ref 3.87–5.11)
RDW: 13.2 % (ref 11.5–15.5)
RDW: 13.3 % (ref 11.5–15.5)
RDW: 13.1 % (ref 11.5–15.5)
WBC: 16.1 10*3/uL — ABNORMAL HIGH (ref 4.0–10.5)
WBC: 17.4 10*3/uL — ABNORMAL HIGH (ref 4.0–10.5)
WBC: 15.2 10*3/uL — ABNORMAL HIGH (ref 4.0–10.5)
nRBC: 0 % (ref 0.0–0.2)
nRBC: 0 % (ref 0.0–0.2)
nRBC: 0 % (ref 0.0–0.2)

## 2019-12-31 LAB — PHOSPHORUS: Phosphorus: 4.5 mg/dL (ref 2.5–4.6)

## 2019-12-31 LAB — MAGNESIUM: Magnesium: 2.1 mg/dL (ref 1.7–2.4)

## 2019-12-31 LAB — TSH: TSH: 1.963 u[IU]/mL (ref 0.350–4.500)

## 2019-12-31 MED ORDER — SODIUM CHLORIDE 0.9 % IV BOLUS
500.0000 mL | Freq: Once | INTRAVENOUS | Status: AC
  Administered 2019-12-31: 500 mL via INTRAVENOUS

## 2019-12-31 MED ORDER — DIPHENHYDRAMINE HCL 50 MG/ML IJ SOLN
INTRAMUSCULAR | Status: AC
  Filled 2019-12-31: qty 1

## 2019-12-31 MED ORDER — DIPHENHYDRAMINE HCL 50 MG/ML IJ SOLN
12.5000 mg | Freq: Once | INTRAMUSCULAR | Status: AC
  Administered 2019-12-31: 12.5 mg via INTRAVENOUS

## 2019-12-31 MED ORDER — TECHNETIUM TC 99M-LABELED RED BLOOD CELLS IV KIT
24.1800 | PACK | Freq: Once | INTRAVENOUS | Status: AC | PRN
  Administered 2019-12-31: 24.18 via INTRAVENOUS

## 2019-12-31 MED ORDER — LORATADINE 10 MG PO TABS
10.0000 mg | ORAL_TABLET | Freq: Every day | ORAL | Status: DC
  Administered 2019-12-31 – 2020-01-04 (×4): 10 mg via ORAL
  Filled 2019-12-31 (×4): qty 1

## 2019-12-31 MED ORDER — DIPHENHYDRAMINE HCL 25 MG PO CAPS: 25.0000 mg | ORAL_CAPSULE | Freq: Four times a day (QID) | ORAL | Status: DC | PRN

## 2019-12-31 MED ORDER — HYDROCODONE-ACETAMINOPHEN 5-325 MG PO TABS
1.0000 | ORAL_TABLET | ORAL | Status: DC | PRN
  Administered 2019-12-31 – 2020-01-03 (×2): 1 via ORAL
  Filled 2019-12-31 (×2): qty 1

## 2019-12-31 MED ORDER — SODIUM CHLORIDE 0.9 % IV SOLN: INTRAVENOUS | Status: DC

## 2019-12-31 NOTE — Consult Note (Signed)
GI Inpatient Consult Note  Reason for Consult: Colitis, Hematochezia    Attending Requesting Consult: Dr. Loletha Grayer  History of Present Illness: Rachael Jordan is a 64 y.o. female seen for evaluation of colitis and hematochezia at the request of Dr. Leslye Peer. Patient has a PMH of HTN, HLD, asthma, GERD, chronic pain syndrome, Vit B12 deficiency, chronic neck pain, and hx of H pylori infection. She presented to the [facility] ED last night with acute onset nausea, vomiting, and diarrhea with severe generalized abdominal cramping and associated hematochezia. She reports she had just eaten some vegetable soup and then noticed an acute onset of bilateral lower abdominal cramping, fecal urgency, and then multiple episodes of diarrhea as well as nausea. She reports the diarrhea started first and then she noticed the bleeding afterwards. Upon presentation to the ED, she was found to have significant leukocytosis with WBC 19.2K, stable hemoglobin (12.7), and cross-sectional imaging with long segment acute colitis from splenic flexure to the sigmoid colon. She had negative stool studies for infection. She was started on Zosyn. She is going for a GI bleeding scan early this afternoon to rule out active bleeding. Last episode of bleeding was this morning. She describes bleeding as "appearing like cranberry sauce." She denise any excessive physical activity. She reports her nephrologist recently had added an anti-hypertensive to her blood pressure medication to help optimize BP. She did have screening colonoscopy 12/2018 performed by Dr. Allen Norris which showed normal examined colon and no specimens collected.    Last Colonoscopy:  12/29/2018 (Dr. Allen Norris) -normal examined colon. No specimens collected. Repeat 10-years.   Last Endoscopy:    Past Medical History:  Past Medical History:  Diagnosis Date  . Acid reflux   . Anxiety   . Arthritis   . Bronchitis 09/2015  . Gross hematuria   . H. pylori infection   .  Heart murmur   . Kidney failure   . 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 Lung abnormality    "damaged lung due to pneumonia"  . Neck pain   . Nephrolithiasis   . Pneumonia   . Pneumothorax   . Scoliosis   . Urinary frequency   . Vaginal atrophy     Problem List: Patient Active Problem List   Diagnosis Date Noted  . Left sided abdominal pain   . Lower GI bleed   . Hypotension   . Acute colitis 12/30/2019  . Acute nonintractable headache   . Multiple falls 02/20/2019  . Myoclonic jerking 02/20/2019  . COPD (chronic obstructive pulmonary disease) ([place]) 02/20/2019  . Seasonal allergies 02/20/2019  . Insomnia 02/20/2019  . Depression with anxiety 02/20/2019  . AKI (acute kidney injury) ([place]) 02/20/2019  . Dizziness 02/18/2019  . Encounter for screening colonoscopy   . Bunion 05/06/2018  . Metatarsalgia of right foot 05/06/2018  . Age-related osteoporosis without current pathological fracture 03/31/2018  . False positive ana 03/31/2018  . Mouth dryness 02/18/2018  . Screening for osteoporosis 02/18/2018  . 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 Essential hypertension 01/06/2018  . Lacunar infarction ([place]) 01/06/2018  . Compression fracture of thoracic vertebra, sequela 07/12/2017  . Lumbar compression fracture, sequela 07/12/2017  . Nocturnal hypoxia 07/12/2017  . Simple chronic bronchitis ([place]) 07/12/2017  . Intractable back pain 06/05/2017  . Overweight (BMI 25.0-29.9) 04/08/2017  . Chronic pain syndrome 04/06/2017  . Sensory disturbance 01/08/2017  . Pain in the groin, right 10/28/2016  . Paresthesia of both hands 12/14/2015  . B12 deficiency 10/31/2015  . SOB (  shortness of breath) 08/29/2015  . History of hematuria 07/12/2015  . Urinary frequency 07/12/2015  . Vaginal atrophy 07/12/2015  . HAV (hallux abducto valgus) 07/02/2015  . Overlapping toe 07/02/2015  . Hammertoe 07/02/2015  . LBP (low back pain) 01/17/2015  . Trochanteric bursitis  of right hip 01/17/2015  . PNA (pneumonia) 12/28/2014  . DDD (degenerative disc disease), lumbar 08/15/2014  . Lumbar radiculopathy 08/15/2014  . 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 DDD (degenerative disc disease), cervical 08/15/2014  . Bilateral occipital neuralgia 08/15/2014  . Migraine 08/15/2014  . Sacroiliac joint dysfunction 08/15/2014  . Back pain, chronic 09/23/2013  . Chronic cervical pain 09/23/2013  . Arthralgia of temporomandibular joint 08/29/2013  . Absolute anemia 08/15/2013  . Chronic kidney disease (CKD), stage III (moderate) ([place]) 08/15/2013  . Abnormal serum level of alkaline phosphatase 08/15/2013  . Blush 08/15/2013  . Hematuria, microscopic 08/15/2013  . Inflamed nasal mucosa 08/15/2013  . Big thyroid 08/15/2013  . Disease of thyroid gland 08/15/2013  . Positive H. pylori test 08/15/2013  . Gastrointestinal ulcer due to Helicobacter pylori 30/86/5784  . Other specified bacterial intestinal infections 08/14/2013  . Kidney failure 08/09/2013  . SIRS (systemic inflammatory response syndrome) ([place]) 08/09/2013  . Systemic inflammatory response syndrome (SIRS) ([place]) 08/09/2013    Past Surgical History: Past Surgical History:  Procedure Laterality Date  . ABDOMINAL HYSTERECTOMY     vaginal  . APPENDECTOMY    . COLONOSCOPY WITH PROPOFOL N/A 12/29/2018   Procedure: COLONOSCOPY WITH PROPOFOL;  Surgeon: Lucilla Lame, MD;  Location: [facility] ENDOSCOPY;  Service: Endoscopy;  Laterality: N/A;  . FOOT SURGERY Left    10/2015  . NECK SURGERY      Allergies: Allergies  Allergen Reactions  . Grapefruit Extract Rash  . Oxycodone Itching  . Propoxyphene Nausea And Vomiting and Nausea Only    Home Medications: Medications Prior to Admission  Medication Sig Dispense Refill Last Dose  . albuterol (VENTOLIN HFA) 108 (90 Base) MCG/ACT inhaler Inhale 2 puffs into the lungs every 6 (six) hours as needed for wheezing or shortness of  breath. 18 g 2 Past Week at prn  . amLODipine (NORVASC) 5 MG tablet Take 5 mg by mouth daily.   12/29/2019 at Unknown time  . aspirin EC 81 MG tablet Take 81 mg by mouth daily.   12/29/2019 at Unknown time  . azelastine (ASTELIN) 0.1 % nasal spray Place 1-2 sprays into both nostrils 2 (two) times daily.    12/29/2019 at Unknown time  . Biotin 10000 MCG TABS Take 10 mg by mouth daily.    12/29/2019 at Unknown time  . budesonide-formoterol (SYMBICORT) 80-4.5 MCG/ACT inhaler Inhale 2 puffs into the lungs 2 (two) times daily. 10.2 g 3 12/29/2019 at Unknown time  . cetirizine (ZYRTEC) 10 MG tablet Take 10 mg by mouth at bedtime.    12/29/2019 at Unknown time  . Cholecalciferol (VITAMIN D-1000 MAX ST) 25 MCG (1000 UT) tablet Take 1,000 Units by mouth daily.    12/29/2019 at Unknown time  . montelukast (SINGULAIR) 10 MG tablet Take 10 mg by mouth at bedtime.    12/29/2019 at Unknown time  . Multiple Vitamins-Minerals (CENTRUM ADULTS PO) Take 1 tablet by mouth daily.    12/29/2019 at Unknown time  . nortriptyline (PAMELOR) 10 MG capsule Take 10 mg by mouth 2 (two) times daily.   12/29/2019 at Unknown time  . olmesartan (BENICAR) 40 MG tablet Take 40 mg by mouth daily.  12/29/2019 at Unknown time  . pantoprazole (PROTONIX) 40 MG tablet Take 40 mg by mouth daily.   12/29/2019 at Unknown time  . pyridOXINE (VITAMIN B-6) 100 MG tablet Take 100 mg by mouth daily.   12/29/2019 at Unknown time  . rizatriptan (MAXALT) 10 MG tablet Take 10 mg by mouth as needed for migraine. May repeat in 2 hours if needed   12/29/2019 at prn  . tiZANidine (ZANAFLEX) 4 MG tablet Take 4 mg by mouth 4 (four) times daily as needed for muscle spasms.    12/29/2019 at prn  . traZODone (DESYREL) 100 MG tablet Take 200 mg by mouth at bedtime.    12/29/2019 at Unknown time  . valACYclovir (VALTREX) 1000 MG tablet Take 1,000 mg by mouth daily.    12/29/2019 at Unknown time  . vitamin C (ASCORBIC ACID) 500 MG tablet Take 500 mg by mouth  daily.   12/29/2019 at Unknown time  . Vitamin E 45 MG CAPS Take 90 mg by mouth daily.   12/29/2019 at Unknown time  . albuterol (PROVENTIL) (2.5 MG/3ML) 0.083% nebulizer solution Take 2.5 mg by nebulization every 6 (six) hours as needed for wheezing or shortness of breath.   unknown at prn  . gabapentin (NEURONTIN) 800 MG tablet Take 800 mg by mouth 3 (three) times daily.    unknown at prn  . morphine (MSIR) 15 MG tablet Take 15 mg by mouth 2 (two) times daily as needed.     Marland Kitchen oxycodone-acetaminophen (LYNOX) 10-300 MG tablet Take 1 tablet by mouth every 4 (four) hours as needed for pain. (Patient not taking: Reported on 12/30/2019)   Not Taking at Unknown time  . Trospium Chloride 60 MG CP24 Take 1 capsule (60 mg total) by mouth daily. (Patient not taking: Reported on 12/30/2019) 90 capsule 3 Not Taking at Unknown time  . venlafaxine XR (EFFEXOR-XR) 75 MG 24 hr capsule Take 37.5 mg by mouth daily.  (Patient not taking: Reported on 11/10/2019)      Home medication reconciliation was completed with the patient.   Scheduled Inpatient Medications:   . fluticasone furoate-vilanterol  1 puff Inhalation Daily  . gabapentin  800 mg Oral TID  . loratadine  10 mg Oral Daily  . montelukast  10 mg Oral QHS  . nortriptyline  10 mg Oral BID  . pantoprazole  40 mg Oral Daily  . traZODone  200 mg Oral QHS  . valACYclovir  1,000 mg Oral Daily    Continuous Inpatient Infusions:   . sodium chloride 50 mL/hr at 12/31/19 1022  . piperacillin-tazobactam Stopped (12/31/19 0927)    PRN Inpatient Medications:  acetaminophen **OR** acetaminophen, albuterol, diphenhydrAMINE, HYDROcodone-acetaminophen, HYDROmorphone (DILAUDID) injection  Family History: family history includes Arthritis in her mother; Asthma in her mother; Cancer in her maternal aunt; Diabetes in her sister; Hyperlipidemia in her mother; Hypertension in her mother.  The patient's family history is negative for inflammatory bowel disorders, GI  malignancy, or solid organ transplantation.  Social History:   reports that she has never smoked. She has never used smokeless tobacco. She reports that she does not drink alcohol and does not use drugs. The patient denies ETOH, tobacco, or drug use.   Review of Systems: Constitutional: Weight is stable.  Eyes: No changes in vision. ENT: No oral lesions, sore throat.  GI: see HPI.  Heme/Lymph: No easy bruising.  CV: No chest pain.  GU: No hematuria.  Integumentary: No rashes.  Neuro: No headaches.  Psych: No depression/anxiety.  Endocrine: No heat/cold intolerance.  Allergic/Immunologic: No urticaria.  Resp: No cough, SOB.  Musculoskeletal: No joint swelling.    Physical Examination: BP 108/71 (BP Location: Left Arm)   Pulse 83   Temp 98.7 F (37.1 C) (Oral)   Resp 17   Ht 5' 5"  (1.651 m)   Wt 69.4 kg   SpO2 100%   BMI 25.46 kg/m  Gen: NAD, alert and oriented x 4 HEENT: PEERLA, EOMI, Neck: supple, no JVD or thyromegaly Chest: CTA bilaterally, no wheezes, crackles, or other adventitious sounds CV: RRR, no m/g/c/r Abd: soft, nondistended, +BS in all four quadrants; tenderness to deep palpation in LLQ, no HSM, guarding, ridigity, or rebound tenderness Ext: no edema, well perfused with 2+ pulses, Skin: no rash or lesions noted Lymph: no LAD  Data: Lab Results  Component Value Date   WBC 17.4 (H) 12/31/2019   HGB 11.8 (L) 12/31/2019   HCT 36.1 12/31/2019   MCV 92.8 12/31/2019   PLT 337 12/31/2019   Recent Labs  Lab 12/31/19 0453 12/31/19 1019 12/31/19 1517  HGB 11.4* 11.2* 11.8*   Lab Results  Component Value Date   NA 138 12/31/2019   K 3.9 12/31/2019   CL 104 12/31/2019   CO2 27 12/31/2019   BUN 9 12/31/2019   CREATININE 1.01 (H) 12/31/2019   Lab Results  Component Value Date   ALT 14 12/31/2019   AST 19 12/31/2019   ALKPHOS 54 12/31/2019   BILITOT 0.7 12/31/2019   No results for input(s): APTT, INR, PTT in the last 168 hours.   CT abd/pelvis  with contrast 12/30/2019: FINDINGS: Lower chest: Stable mild lower lobe scarring. No cardiomegaly. No pericardial or pleural effusion.  Hepatobiliary: Negative liver and gallbladder.  Pancreas: Negative.  Spleen: Negative.  Adrenals/Urinary Tract: Normal adrenal glands. Bilateral renal enhancement and contrast excretion is symmetric and within normal limits. Normal proximal ureters. Mildly distended but otherwise unremarkable urinary bladder. No nephrolithiasis.  Stomach/Bowel: The rectum is decompressed and relatively negative. Beginning in the mid sigmoid colon there is circumferential large bowel wall thickening, which becomes moderate to severe in the descending colon and is accompanied by long segment pericolonic mesenteric stranding (series 2, image 47). The changes persist through the splenic flexure. The mid transverse colon begins to normalize, and the proximal transverse appears normal on series 2, image 32. Negative right colon. Oral contrast has reached the ascending colon. Diminutive or absent appendix.  Negative terminal ileum. No dilated small bowel. Negative stomach and duodenum. No free air. No free fluid. No other mesenteric stranding.  Vascular/Lymphatic: Major arterial structures are patent. Minimal atherosclerosis. Portal venous system is patent. No lymphadenopathy.  Reproductive: Absent uterus.  Diminutive or absent ovaries.  Other: No pelvic free fluid.  Musculoskeletal: Stable mild T12 compression fracture. Chronic disc and endplate degeneration at the lumbosacral junction. No acute osseous abnormality identified.  IMPRESSION: Long segment Acute Colitis from the splenic flexure to the sigmoid colon. No perforation, obstruction, or other complicating features.   NM GI Bleeding Scan 12/31/2019: FINDINGS: There is a good red cell blood tag with normal blood pool activity. No evidence of active gastrointestinal bleeding. There is  mildly prominent asymmetric activity in the right lower quadrant which does not move or change throughout the study, potentially reflecting mild cecal angiodysplasia.  IMPRESSION: 1. No evidence of active gastrointestinal bleeding. 2. Mildly asymmetric activity in the right lower quadrant, potentially reflecting cecal angiodysplasia.  Assessment/Plan:  64 y/o AA female with a PMH of PMH of HTN,  HLD, asthma, GERD, Vit B12 deficiency, chronic neck pain, and hx of H pylori infection admitted for acute onset nausea, vomiting, diarrhea with associated generalized abdominal pain with hematochezia  1. Acute left-sided colitis - likely ischemic colitis in etiology given her clinical presentation. Risk factors for ischemia include age (>47), female, and uncontrolled hypertension. DDx also includes viral/bacterial gastroenteritis, IBD, nonspecific colitis, medication-induced colitis, malignancy, AVMs, etc  2. Bilateral lower abdominal cramping, L>R - 2/2 #1. On IV pain meds.  3. Hematochezia - resolved currently, last episode this morning. Hgb stable.  4. Chronic pain syndrome  Recommendations:  1. NM GI bleeding scan reviewed with no concern for active bleed. Radiologist commented on mild asymmetric activity in RLQ, potentially reflecting cecal angiodysplasia 2. Hematochezia has resolved at this point in time. Last episode this morning. Continue to monitor closely. Infectious stool studies negative. 3. Continue pain control 4. Since she had good quality colonoscopy 12/2018 which was completely normal, we will elect to hold off on colonoscopy during hospitalization as long as there is no evidence of recurrent bleeding. If she has bleeding, will plan on colonoscopy over the weekend 5. Continue to monitor H&H closely. Currently, hemoglobin stable. 6. Discussed importance of preventing dehydration and maintaining good blood pressure with her anti-hypertensive regimen 7. Following along with you      Thank you for the consult. Please call with questions or concerns.  Reeves Forth [facility] [phone] (Cell)

## 2019-12-31 NOTE — ED Notes (Signed)
Breakfast tray provided to pt.

## 2019-12-31 NOTE — ED Notes (Signed)
Pt taken for GI bleed study at this time.

## 2019-12-31 NOTE — Progress Notes (Signed)
Patient arrived to unit in stable condition. Pt ambulated to bed from stretcher with unsteady gait noted. Pt endorses feeling lightheaded.

## 2019-12-31 NOTE — Plan of Care (Signed)
  Problem: Education: Goal: Knowledge of General Education information will improve Description Including pain rating scale, medication(s)/side effects and non-pharmacologic comfort measures Outcome: Progressing   

## 2019-12-31 NOTE — Progress Notes (Signed)
[name], female   DOB: 31-Mar-1955, 64 y.o.   MRN: [id number] [facility] PROGRESS NOTE  Lawan Nanez VQQ:[id number] DOB: 05-26-1955 DOA: 12/30/2019 PCP: Sharyne Peach, MD  HPI/Subjective: Patient seen this morning.  She had a lot of abdominal pain mostly on the left side of her abdomen.  She states that she also lost a lot of blood.  Some nausea but no current vomiting.  She did vomit prior to coming into the hospital.  Also had quite a bit of itching after the pain medication.  Objective: Vitals:   12/31/19 0953 12/31/19 1316  BP: (!) 94/55 93/67  Pulse:    Resp:    Temp:    SpO2:      Intake/Output Summary (Last 24 hours) at 12/31/2019 1326 Last data filed at 12/30/2019 1850 Gross per 24 hour  Intake 500 ml  Output --  Net 500 ml   Filed Weights   12/30/19 1210  Weight: 69.4 kg    ROS: Review of Systems  Respiratory: Negative for cough and shortness of breath.   Cardiovascular: Negative for chest pain.  Gastrointestinal: Positive for abdominal pain, diarrhea, nausea and vomiting.   Exam: Physical Exam HENT:     Head: Normocephalic.     Mouth/Throat:     Pharynx: No oropharyngeal exudate.  Eyes:     General: Lids are normal.     Conjunctiva/sclera: Conjunctivae normal.     Pupils: Pupils are equal, round, and reactive to light.  Cardiovascular:     Rate and Rhythm: Normal rate and regular rhythm.     Heart sounds: Normal heart sounds, S1 normal and S2 normal.  Pulmonary:     Breath sounds: No decreased breath sounds, wheezing, rhonchi or rales.  Abdominal:     Palpations: Abdomen is soft.     Tenderness: There is abdominal tenderness in the left upper quadrant and left lower quadrant.  Musculoskeletal:     Right lower leg: No swelling.     Left lower leg: No swelling.  Skin:    General: Skin is warm.     Findings: No rash.  Neurological:     Mental Status: She is alert and oriented to person, place, and time.        Data Reviewed: Basic Metabolic Panel: Recent Labs  Lab 12/30/19 1217 12/31/19 0453  NA 138 138  K 3.7 3.9  CL 102 104  CO2 24 27  GLUCOSE 108* 104*  BUN 12 9  CREATININE 0.96 1.01*  CALCIUM 9.5 8.7*  MG 2.2 2.1  PHOS  --  4.5   Liver Function Tests: Recent Labs  Lab 12/30/19 1217 12/31/19 0453  AST 24 19  ALT 18 14  ALKPHOS 59 54  BILITOT 0.7 0.7  PROT 8.1 6.9  ALBUMIN 4.3 3.7   Recent Labs  Lab 12/30/19 1217  LIPASE 25   CBC: Recent Labs  Lab 12/30/19 1217 12/30/19 2113 12/31/19 0453 12/31/19 1019  WBC 19.2* 17.3* 15.6* 16.1*  NEUTROABS  --   --  11.3*  --   HGB 12.7 11.4* 11.4* 11.2*  HCT 38.1 33.7* 35.3* 34.4*  MCV 89.6 88.5 91.5 91.2  PLT 439* 371 365 378   Cardiac Enzymes: Recent Labs  Lab 12/30/19 1217  CKTOTAL 79     Recent Results (from the past 240 hour(s))  Respiratory Panel by RT PCR (Flu A&B, Covid) - Nasopharyngeal Swab     Status: None   Collection Time: 12/30/19  7:30 PM  Specimen: Nasopharyngeal Swab  Result Value Ref Range Status   SARS Coronavirus 2 by RT PCR NEGATIVE NEGATIVE Final    Comment: (NOTE) SARS-CoV-2 target nucleic acids are NOT DETECTED.  The SARS-CoV-2 RNA is generally detectable in upper respiratoy specimens during the acute phase of infection. The lowest concentration of SARS-CoV-2 viral copies this assay can detect is 131 copies/mL. A negative result does not preclude SARS-Cov-2 infection and should not be used as the sole basis for treatment or other patient management decisions. A negative result may occur with  improper specimen collection/handling, submission of specimen other than nasopharyngeal swab, presence of viral mutation(s) within the areas targeted by this assay, and inadequate number of viral copies (<131 copies/mL). A negative result must be combined with clinical observations, patient history, and epidemiological information. The expected result is Negative.  Fact Sheet for  Patients:  PinkCheek.be  Fact Sheet for Healthcare Providers:  GravelBags.it  This test is no t yet approved or cleared by the [place] FDA and  has been authorized for detection and/or diagnosis of SARS-CoV-2 by FDA under an Emergency Use Authorization (EUA). This EUA will remain  in effect (meaning this test can be used) for the duration of the COVID-19 declaration under Section 564(b)(1) of the Act, 21 U.S.C. section 360bbb-3(b)(1), unless the authorization is terminated or revoked sooner.     Influenza A by PCR NEGATIVE NEGATIVE Final   Influenza B by PCR NEGATIVE NEGATIVE Final    Comment: (NOTE) The Xpert Xpress SARS-CoV-2/FLU/RSV assay is intended as an aid in  the diagnosis of influenza from Nasopharyngeal swab specimens and  should not be used as a sole basis for treatment. Nasal washings and  aspirates are unacceptable for Xpert Xpress SARS-CoV-2/FLU/RSV  testing.  Fact Sheet for Patients: PinkCheek.be  Fact Sheet for Healthcare Providers: GravelBags.it  This test is not yet approved or cleared by the [place] FDA and  has been authorized for detection and/or diagnosis of SARS-CoV-2 by  FDA under an Emergency Use Authorization (EUA). This EUA will remain  in effect (meaning this test can be used) for the duration of the  Covid-19 declaration under Section 564(b)(1) of the Act, 21  U.S.C. section 360bbb-3(b)(1), unless the authorization is  terminated or revoked. Performed at [facility], [place], [place] [address]   C Difficile Quick Screen w PCR reflex     Status: None   Collection Time: 12/30/19  9:13 PM   Specimen: STOOL  Result Value Ref Range Status   C Diff antigen NEGATIVE NEGATIVE Final   C Diff toxin NEGATIVE NEGATIVE Final   C Diff interpretation No C. difficile detected.  Final    Comment: Performed  at [facility], [address] [place], [place] [address]  Gastrointestinal Panel by PCR , Stool     Status: None   Collection Time: 12/30/19  9:13 PM   Specimen: STOOL  Result Value Ref Range Status   Campylobacter species NOT DETECTED NOT DETECTED Final   Plesimonas shigelloides NOT DETECTED NOT DETECTED Final   Salmonella species NOT DETECTED NOT DETECTED Final   Yersinia enterocolitica NOT DETECTED NOT DETECTED Final   Vibrio species NOT DETECTED NOT DETECTED Final   Vibrio cholerae NOT DETECTED NOT DETECTED Final   Enteroaggregative E coli (EAEC) NOT DETECTED NOT DETECTED Final   Enteropathogenic E coli (EPEC) NOT DETECTED NOT DETECTED Final   Enterotoxigenic E coli (ETEC) NOT DETECTED NOT DETECTED Final   Shiga like toxin producing  E coli (STEC) NOT DETECTED NOT DETECTED Final   Shigella/Enteroinvasive E coli (EIEC) NOT DETECTED NOT DETECTED Final   Cryptosporidium NOT DETECTED NOT DETECTED Final   Cyclospora cayetanensis NOT DETECTED NOT DETECTED Final   Entamoeba histolytica NOT DETECTED NOT DETECTED Final   Giardia lamblia NOT DETECTED NOT DETECTED Final   Adenovirus F40/41 NOT DETECTED NOT DETECTED Final   Astrovirus NOT DETECTED NOT DETECTED Final   Norovirus GI/GII NOT DETECTED NOT DETECTED Final   Rotavirus A NOT DETECTED NOT DETECTED Final   Sapovirus (I, II, IV, and V) NOT DETECTED NOT DETECTED Final    Comment: Performed at [facility], [address] [place], [place] [address]     Studies: CT ABDOMEN PELVIS W CONTRAST  Result Date: 12/30/2019 CLINICAL DATA:  64 year old female with sudden onset abdominal pain, nausea vomiting diarrhea last night. Bright red blood in stool this morning. EXAM: CT ABDOMEN AND PELVIS WITH CONTRAST TECHNIQUE: Multidetector CT imaging of the abdomen and pelvis was performed using the standard protocol following bolus administration of intravenous contrast. CONTRAST:  173m OMNIPAQUE IOHEXOL 300 MG/ML  SOLN  COMPARISON:  Noncontrast CT Abdomen and Pelvis 05/13/2019 and earlier. FINDINGS: Lower chest: Stable mild lower lobe scarring. No cardiomegaly. No pericardial or pleural effusion. Hepatobiliary: Negative liver and gallbladder. Pancreas: Negative. Spleen: Negative. Adrenals/Urinary Tract: Normal adrenal glands. Bilateral renal enhancement and contrast excretion is symmetric and within normal limits. Normal proximal ureters. Mildly distended but otherwise unremarkable urinary bladder. No nephrolithiasis. Stomach/Bowel: The rectum is decompressed and relatively negative. Beginning in the mid sigmoid colon there is circumferential large bowel wall thickening, which becomes moderate to severe in the descending colon and is accompanied by long segment pericolonic mesenteric stranding (series 2, image 47). The changes persist through the splenic flexure. The mid transverse colon begins to normalize, and the proximal transverse appears normal on series 2, image 32. Negative right colon. Oral contrast has reached the ascending colon. Diminutive or absent appendix. Negative terminal ileum. No dilated small bowel. Negative stomach and duodenum. No free air. No free fluid. No other mesenteric stranding. Vascular/Lymphatic: Major arterial structures are patent. Minimal atherosclerosis. Portal venous system is patent. No lymphadenopathy. Reproductive: Absent uterus.  Diminutive or absent ovaries. Other: No pelvic free fluid. Musculoskeletal: Stable mild T12 compression fracture. Chronic disc and endplate degeneration at the lumbosacral junction. No acute osseous abnormality identified. IMPRESSION: Long segment Acute Colitis from the splenic flexure to the sigmoid colon. No perforation, obstruction, or other complicating features. Electronically Signed   By: HGenevie AnnM.D.   On: 12/30/2019 17:31    Scheduled Meds: . fluticasone furoate-vilanterol  1 puff Inhalation Daily  . gabapentin  800 mg Oral TID  . loratadine  10 mg Oral  Daily  . montelukast  10 mg Oral QHS  . nortriptyline  10 mg Oral BID  . pantoprazole  40 mg Oral Daily  . traZODone  200 mg Oral QHS  . valACYclovir  1,000 mg Oral Daily   Continuous Infusions: . sodium chloride 50 mL/hr at 12/31/19 1022  . piperacillin-tazobactam Stopped (12/31/19 0927)  . sodium chloride      Assessment/Plan:  1. Acute left-sided colitis seen on CT scan.  Stool comprehensive panel and C. difficile were negative.  Patient empirically placed on Zosyn. 2. Severe left-sided abdominal pain requiring IV medications.  We will add oral medications also.  Itching secondary to pain medications Benadryl and Claritin prescribed. 3. Rectal bleeding.  Bleeding scan ordered because the patient stated that she  had a lot of blood loss.  Last few hemoglobin stable though. 4. Relative hypotension likely secondary to pain medications and could be also due to blood loss.  Serial hemoglobins.  Discontinue Norvasc.  IV fluid bolus. 5. Chronic pain.  Could have a withdrawal component also with nausea vomiting and diarrhea.  Continue IV and oral pain medications.     Code Status:     Code Status Orders  (From admission, onward)         Start     Ordered   12/30/19 2122  Full code  Continuous        12/30/19 2121        Code Status History    Date Active Date Inactive Code Status Order ID Comments User Context   02/18/2019 1542 02/20/2019 2014 Full Code [id number]  Ezekiel Slocumb, DO ED   06/05/2017 1753 06/08/2017 2016 Full Code [id number]  Bettey Costa, MD Inpatient   Advance Care Planning Activity    Advance Directive Documentation     Most Recent Value  Type of Advance Directive Living will, Healthcare Power of Attorney  Pre-existing out of facility DNR order (yellow form or pink MOST form) --  "MOST" Form in Place? --     Family Communication: Called son on the phone Disposition Plan: Status is: Inpatient  Dispo: The patient is from: Home              Anticipated d/c  is to: Home              Anticipated d/c date is: Will likely need 3 days here in the hospital depending on clinical course.              Patient currently treated for colitis, hypotension, lower GI bleeding  Consultants:  Gastroenterology  Time spent: 28 minutes  [place]

## 2020-01-01 DIAGNOSIS — K625 Hemorrhage of anus and rectum: Secondary | ICD-10-CM | POA: Diagnosis not present

## 2020-01-01 DIAGNOSIS — R1032 Left lower quadrant pain: Secondary | ICD-10-CM | POA: Diagnosis not present

## 2020-01-01 DIAGNOSIS — N179 Acute kidney failure, unspecified: Secondary | ICD-10-CM

## 2020-01-01 DIAGNOSIS — K529 Noninfective gastroenteritis and colitis, unspecified: Secondary | ICD-10-CM | POA: Diagnosis not present

## 2020-01-01 DIAGNOSIS — I959 Hypotension, unspecified: Secondary | ICD-10-CM | POA: Diagnosis not present

## 2020-01-01 LAB — BASIC METABOLIC PANEL
Anion gap: 8 (ref 5–15)
BUN: 7 mg/dL — ABNORMAL LOW (ref 8–23)
CO2: 25 mmol/L (ref 22–32)
Calcium: 8.3 mg/dL — ABNORMAL LOW (ref 8.9–10.3)
Chloride: 106 mmol/L (ref 98–111)
Creatinine, Ser: 1.07 mg/dL — ABNORMAL HIGH (ref 0.44–1.00)
GFR, Estimated: 56 mL/min — ABNORMAL LOW (ref >60)
Glucose, Bld: 83 mg/dL (ref 70–99)
Potassium: 3.9 mmol/L (ref 3.5–5.1)
Sodium: 139 mmol/L (ref 135–145)

## 2020-01-01 LAB — HEMOGLOBIN: Hemoglobin: 10.5 g/dL — ABNORMAL LOW (ref 12.0–15.0)

## 2020-01-01 MED ORDER — SODIUM CHLORIDE 0.9 % IV BOLUS
500.0000 mL | Freq: Once | INTRAVENOUS | Status: AC
  Administered 2020-01-01: 500 mL via INTRAVENOUS

## 2020-01-01 MED ORDER — MAGNESIUM SULFATE 2 GM/50ML IV SOLN: 2.0000 g | Freq: Once | INTRAVENOUS | Status: DC

## 2020-01-01 MED ORDER — MAGNESIUM SULFATE 2 GM/50ML IV SOLN
2.0000 g | Freq: Once | INTRAVENOUS | Status: AC
  Administered 2020-01-01: 2 g via INTRAVENOUS
  Filled 2020-01-01: qty 50

## 2020-01-01 MED ORDER — POLYETHYLENE GLYCOL 3350 17 GM/SCOOP PO POWD
1.0000 | Freq: Once | ORAL | Status: AC
  Administered 2020-01-01: 15:00:00 255 g via ORAL
  Filled 2020-01-01: qty 255

## 2020-01-01 NOTE — Progress Notes (Signed)
   01/01/20 1119  Assess: MEWS Score  Temp 98.7 F (37.1 C)  BP 93/68  Pulse Rate (!) 101  Resp 19  Level of Consciousness Alert  SpO2 98 %  O2 Device Room Air  Assess: MEWS Score  MEWS Temp 0  MEWS Systolic 1  MEWS Pulse 1  MEWS RR 0  MEWS LOC 0  MEWS Score 2  MEWS Score Color Yellow  Assess: if the MEWS score is Yellow or Red  Were vital signs taken at a resting state? Yes  Focused Assessment No change from prior assessment  Early Detection of Sepsis Score *See Row Information* Medium  MEWS guidelines implemented *See Row Information* Yes  Treat  MEWS Interventions Escalated (See documentation below)  Escalate  MEWS: Escalate Yellow: discuss with charge nurse/RN and consider discussing with provider and RRT  Notify: Charge Nurse/RN  Name of Charge Nurse/RN Notified Nicola Girt  Date Charge Nurse/RN Notified 01/01/20  Time Charge Nurse/RN Notified 1139  Notify: Provider  Provider Name/Title [facility]  Date Provider Notified 01/01/20  Time Provider Notified 1138  Notification Type Page (secure chat)  Notification Reason Other (Comment) (Hypotension with yellow MEWS)  Response No new orders (Continue to monitor)  Document  Patient Outcome Other (Comment) (Patient remains stable. Will continue to monitor)  Progress note created (see row info) Yes

## 2020-01-01 NOTE — H&P (View-Only) (Signed)
GI Inpatient Follow-up Note  Subjective:  Patient seen in follow-up for acute left-sided colitis, likely ischemic colitis in etiology. Patient reports last night she was incontinent of her bowels and woke up this morning with a lot of black stool in her pad and on her chuck. She denies seeing any clots or maroon-colored stool. She reports she was cleaned off and had her sheets changed. She reports she is also having bilateral lower abdominal cramping rated as severe, left > right. She denies fever, nausea, vomiting, or rectal pain. She is upset she is not progressing as she had hoped.  Scheduled Inpatient Medications:  . fluticasone furoate-vilanterol  1 puff Inhalation Daily  . gabapentin  800 mg Oral TID  . loratadine  10 mg Oral Daily  . montelukast  10 mg Oral QHS  . nortriptyline  10 mg Oral BID  . pantoprazole  40 mg Oral Daily  . traZODone  200 mg Oral QHS  . valACYclovir  1,000 mg Oral Daily    Continuous Inpatient Infusions:   . sodium chloride 50 mL/hr at 12/31/19 1022  . piperacillin-tazobactam 3.375 g (01/01/20 0520)    PRN Inpatient Medications:  acetaminophen **OR** acetaminophen, albuterol, diphenhydrAMINE, HYDROcodone-acetaminophen, HYDROmorphone (DILAUDID) injection  Review of Systems: Constitutional: Weight is stable.  Eyes: No changes in vision. ENT: No oral lesions, sore throat.  GI: see HPI.  Heme/Lymph: No easy bruising.  CV: No chest pain.  GU: No hematuria.  Integumentary: No rashes.  Neuro: No headaches.  Psych: No depression/anxiety.  Endocrine: No heat/cold intolerance.  Allergic/Immunologic: No urticaria.  Resp: No cough, SOB.  Musculoskeletal: No joint swelling.    Physical Examination: BP 93/68 (BP Location: Right Arm)   Pulse (!) 101   Temp 98.7 F (37.1 C) (Oral)   Resp 19   Ht 5' 5"  (1.651 m)   Wt 69.4 kg   SpO2 98%   BMI 25.46 kg/m   Appears uncomfortable. No accessory muscle use. Answers all questions appropriately.  Gen: NAD,  alert and oriented x 4 HEENT: PEERLA, EOMI, Neck: supple, no JVD or thyromegaly Chest: CTA bilaterally, no wheezes, crackles, or other adventitious sounds CV: RRR, no m/g/c/r Abd: soft, ND, +BS in all four quadrants; tender to light and deep palpation in bilateral lower abdomen, L>R, no HSM, guarding, ridigity, or rebound tenderness Ext: no edema, well perfused with 2+ pulses, Skin: no rash or lesions noted Lymph: no LAD  Data: Lab Results  Component Value Date   WBC 15.2 (H) 12/31/2019   HGB 11.2 (L) 12/31/2019   HCT 34.5 (L) 12/31/2019   MCV 91.5 12/31/2019   PLT 339 12/31/2019   Recent Labs  Lab 12/31/19 1019 12/31/19 1517 12/31/19 2017  HGB 11.2* 11.8* 11.2*   Lab Results  Component Value Date   NA 139 01/01/2020   K 3.9 01/01/2020   CL 106 01/01/2020   CO2 25 01/01/2020   BUN 7 (L) 01/01/2020   CREATININE 1.07 (H) 01/01/2020   Lab Results  Component Value Date   ALT 14 12/31/2019   AST 19 12/31/2019   ALKPHOS 54 12/31/2019   BILITOT 0.7 12/31/2019   No results for input(s): APTT, INR, PTT in the last 168 hours. Assessment/Plan:  64 y/o AA female with a PMH of PMH of HTN, HLD, asthma, GERD, Vit B12 deficiency, chronic neck pain, and hx of H pylori infection admitted for acute onset nausea, vomiting, diarrhea with associated generalized abdominal pain with hematochezia  1. Acute left-sided colitis - likely ischemic  colitis in etiology given her clinical presentation. Risk factors for ischemia include age (>41), female, and uncontrolled hypertension. DDx also includes viral/bacterial gastroenteritis, IBD, nonspecific colitis, medication-induced colitis, malignancy, AVMs, etc  2. Bilateral lower abdominal cramping, L>R - 2/2 #1. On IV pain meds.  3. Hematochezia/melena - report of black, tarry stool this morning. Repeat hemoglobin pending at time of dictation.  4. Chronic pain syndrome  Recommendations:  1. Repeat blood work to ensure hemodynamic  stability 2. Advise EGD and colonoscopy for endoluminal evaluation.  Discussed procedure details and indications with patient in room today.  She consents to proceed. 3. Clear liquid diet today. Bowel prep orders in. NPO after midnight 4. Plan for EGD and colonoscopy tomorrow morning with Dr. Alice Reichert 5. See procedure reports for findings and further recommendations  I reviewed the risks (including bleeding, perforation, infection, anesthesia complications, cardiac/respiratory complications), benefits and alternatives of EGD and colonoscopy. Patient consents to proceed.    Please call with questions or concerns.    Octavia Bruckner, PA-C [facility] [phone] (Cell)

## 2020-01-01 NOTE — Progress Notes (Signed)
Pt tolerating bowel prep without difficulty. Patient able to ambulate to bathroom/BSC with steady gait. Denies any dizziness with ambulation at present time. Encouraged to call for assistance if needed or if dizziness occurs. Pt verbalized understanding.

## 2020-01-01 NOTE — Progress Notes (Signed)
Patient slightly hypotensive, asymptomatic.MEWS yellow. MD made aware. Will continue to monitor.

## 2020-01-01 NOTE — Progress Notes (Signed)
GI Inpatient Follow-up Note  Subjective:  Patient seen in follow-up for acute left-sided colitis, likely ischemic colitis in etiology. Patient reports last night she was incontinent of her bowels and woke up this morning with a lot of black stool in her pad and on her chuck. She denies seeing any clots or maroon-colored stool. She reports she was cleaned off and had her sheets changed. She reports she is also having bilateral lower abdominal cramping rated as severe, left > right. She denies fever, nausea, vomiting, or rectal pain. She is upset she is not progressing as she had hoped.  Scheduled Inpatient Medications:  . fluticasone furoate-vilanterol  1 puff Inhalation Daily  . gabapentin  800 mg Oral TID  . loratadine  10 mg Oral Daily  . montelukast  10 mg Oral QHS  . nortriptyline  10 mg Oral BID  . pantoprazole  40 mg Oral Daily  . traZODone  200 mg Oral QHS  . valACYclovir  1,000 mg Oral Daily    Continuous Inpatient Infusions:   . sodium chloride 50 mL/hr at 12/31/19 1022  . piperacillin-tazobactam 3.375 g (01/01/20 0520)    PRN Inpatient Medications:  acetaminophen **OR** acetaminophen, albuterol, diphenhydrAMINE, HYDROcodone-acetaminophen, HYDROmorphone (DILAUDID) injection  Review of Systems: Constitutional: Weight is stable.  Eyes: No changes in vision. ENT: No oral lesions, sore throat.  GI: see HPI.  Heme/Lymph: No easy bruising.  CV: No chest pain.  GU: No hematuria.  Integumentary: No rashes.  Neuro: No headaches.  Psych: No depression/anxiety.  Endocrine: No heat/cold intolerance.  Allergic/Immunologic: No urticaria.  Resp: No cough, SOB.  Musculoskeletal: No joint swelling.    Physical Examination: BP 93/68 (BP Location: Right Arm)   Pulse (!) 101   Temp 98.7 F (37.1 C) (Oral)   Resp 19   Ht 5' 5"  (1.651 m)   Wt 69.4 kg   SpO2 98%   BMI 25.46 kg/m   Appears uncomfortable. No accessory muscle use. Answers all questions appropriately.  Gen: NAD,  alert and oriented x 4 HEENT: PEERLA, EOMI, Neck: supple, no JVD or thyromegaly Chest: CTA bilaterally, no wheezes, crackles, or other adventitious sounds CV: RRR, no m/g/c/r Abd: soft, ND, +BS in all four quadrants; tender to light and deep palpation in bilateral lower abdomen, L>R, no HSM, guarding, ridigity, or rebound tenderness Ext: no edema, well perfused with 2+ pulses, Skin: no rash or lesions noted Lymph: no LAD  Data: Lab Results  Component Value Date   WBC 15.2 (H) 12/31/2019   HGB 11.2 (L) 12/31/2019   HCT 34.5 (L) 12/31/2019   MCV 91.5 12/31/2019   PLT 339 12/31/2019   Recent Labs  Lab 12/31/19 1019 12/31/19 1517 12/31/19 2017  HGB 11.2* 11.8* 11.2*   Lab Results  Component Value Date   NA 139 01/01/2020   K 3.9 01/01/2020   CL 106 01/01/2020   CO2 25 01/01/2020   BUN 7 (L) 01/01/2020   CREATININE 1.07 (H) 01/01/2020   Lab Results  Component Value Date   ALT 14 12/31/2019   AST 19 12/31/2019   ALKPHOS 54 12/31/2019   BILITOT 0.7 12/31/2019   No results for input(s): APTT, INR, PTT in the last 168 hours. Assessment/Plan:  64 y/o AA female with a PMH of PMH of HTN, HLD, asthma, GERD, Vit B12 deficiency, chronic neck pain, and hx of H pylori infection admitted for acute onset nausea, vomiting, diarrhea with associated generalized abdominal pain with hematochezia  1. Acute left-sided colitis - likely ischemic  colitis in etiology given her clinical presentation. Risk factors for ischemia include age (>70), female, and uncontrolled hypertension. DDx also includes viral/bacterial gastroenteritis, IBD, nonspecific colitis, medication-induced colitis, malignancy, AVMs, etc  2. Bilateral lower abdominal cramping, L>R - 2/2 #1. On IV pain meds.  3. Hematochezia/melena - report of black, tarry stool this morning. Repeat hemoglobin pending at time of dictation.  4. Chronic pain syndrome  Recommendations:  1. Repeat blood work to ensure hemodynamic  stability 2. Advise EGD and colonoscopy for endoluminal evaluation.  Discussed procedure details and indications with patient in room today.  She consents to proceed. 3. Clear liquid diet today. Bowel prep orders in. NPO after midnight 4. Plan for EGD and colonoscopy tomorrow morning with Dr. Alice Reichert 5. See procedure reports for findings and further recommendations  I reviewed the risks (including bleeding, perforation, infection, anesthesia complications, cardiac/respiratory complications), benefits and alternatives of EGD and colonoscopy. Patient consents to proceed.    Please call with questions or concerns.    Octavia Bruckner, PA-C [facility] [phone] (Cell)

## 2020-01-01 NOTE — Progress Notes (Signed)
[name], female   DOB: September 12, 1955, 64 y.o.   MRN: [id number] [facility] PROGRESS NOTE  Rachael Jordan SKA:[id number] DOB: Feb 14, 1956 DOA: 12/30/2019 PCP: Sharyne Peach, MD  HPI/Subjective: I was notified by the nurse this morning of the patient wanted advance her diet.  I started on regular diet but the patient did not eat very much.  Patient stated that she woke up and was in a black bowel movement.  Still having some abdominal pain.  Objective: Vitals:   01/01/20 0742 01/01/20 1119  BP: 97/64 93/68  Pulse: 78 (!) 101  Resp: 18 19  Temp: 98.2 F (36.8 C) 98.7 F (37.1 C)  SpO2: 98% 98%    Intake/Output Summary (Last 24 hours) at 01/01/2020 1148 Last data filed at 01/01/2020 1000 Gross per 24 hour  Intake 240 ml  Output --  Net 240 ml   Filed Weights   12/30/19 1210  Weight: 69.4 kg    ROS: Review of Systems  Respiratory: Negative for shortness of breath.   Cardiovascular: Negative for chest pain.  Gastrointestinal: Positive for abdominal pain, diarrhea and nausea. Negative for vomiting.   Exam: Physical Exam HENT:     Head: Normocephalic.     Mouth/Throat:     Pharynx: No oropharyngeal exudate.  Eyes:     General: Lids are normal.     Conjunctiva/sclera: Conjunctivae normal.     Pupils: Pupils are equal, round, and reactive to light.  Cardiovascular:     Rate and Rhythm: Normal rate and regular rhythm.     Heart sounds: Normal heart sounds, S1 normal and S2 normal.  Pulmonary:     Breath sounds: No decreased breath sounds, wheezing, rhonchi or rales.  Abdominal:     Palpations: Abdomen is soft.     Tenderness: There is abdominal tenderness in the left upper quadrant and left lower quadrant.  Musculoskeletal:     Right ankle: No swelling.     Left ankle: No swelling.  Skin:    General: Skin is warm.     Findings: No rash.  Neurological:     Mental Status: She is alert and oriented to person, place, and time.        Data Reviewed: Basic Metabolic Panel: Recent Labs  Lab 12/30/19 1217 12/31/19 0453 01/01/20 0459  NA 138 138 139  K 3.7 3.9 3.9  CL 102 104 106  CO2 24 27 25   GLUCOSE 108* 104* 83  BUN 12 9 7*  CREATININE 0.96 1.01* 1.07*  CALCIUM 9.5 8.7* 8.3*  MG 2.2 2.1  --   PHOS  --  4.5  --    Liver Function Tests: Recent Labs  Lab 12/30/19 1217 12/31/19 0453  AST 24 19  ALT 18 14  ALKPHOS 59 54  BILITOT 0.7 0.7  PROT 8.1 6.9  ALBUMIN 4.3 3.7   Recent Labs  Lab 12/30/19 1217  LIPASE 25   CBC: Recent Labs  Lab 12/30/19 2113 12/31/19 0453 12/31/19 1019 12/31/19 1517 12/31/19 2017  WBC 17.3* 15.6* 16.1* 17.4* 15.2*  NEUTROABS  --  11.3*  --   --   --   HGB 11.4* 11.4* 11.2* 11.8* 11.2*  HCT 33.7* 35.3* 34.4* 36.1 34.5*  MCV 88.5 91.5 91.2 92.8 91.5  PLT 371 365 378 337 339   Cardiac Enzymes: Recent Labs  Lab 12/30/19 1217  CKTOTAL 79    Recent Results (from the past 240 hour(s))  Respiratory Panel by RT PCR (Flu A&B, Covid) -  Nasopharyngeal Swab     Status: None   Collection Time: 12/30/19  7:30 PM   Specimen: Nasopharyngeal Swab  Result Value Ref Range Status   SARS Coronavirus 2 by RT PCR NEGATIVE NEGATIVE Final    Comment: (NOTE) SARS-CoV-2 target nucleic acids are NOT DETECTED.  The SARS-CoV-2 RNA is generally detectable in upper respiratoy specimens during the acute phase of infection. The lowest concentration of SARS-CoV-2 viral copies this assay can detect is 131 copies/mL. A negative result does not preclude SARS-Cov-2 infection and should not be used as the sole basis for treatment or other patient management decisions. A negative result may occur with  improper specimen collection/handling, submission of specimen other than nasopharyngeal swab, presence of viral mutation(s) within the areas targeted by this assay, and inadequate number of viral copies (<131 copies/mL). A negative result must be combined with clinical observations,  patient history, and epidemiological information. The expected result is Negative.  Fact Sheet for Patients:  PinkCheek.be  Fact Sheet for Healthcare Providers:  GravelBags.it  This test is no t yet approved or cleared by the [place] FDA and  has been authorized for detection and/or diagnosis of SARS-CoV-2 by FDA under an Emergency Use Authorization (EUA). This EUA will remain  in effect (meaning this test can be used) for the duration of the COVID-19 declaration under Section 564(b)(1) of the Act, 21 U.S.C. section 360bbb-3(b)(1), unless the authorization is terminated or revoked sooner.     Influenza A by PCR NEGATIVE NEGATIVE Final   Influenza B by PCR NEGATIVE NEGATIVE Final    Comment: (NOTE) The Xpert Xpress SARS-CoV-2/FLU/RSV assay is intended as an aid in  the diagnosis of influenza from Nasopharyngeal swab specimens and  should not be used as a sole basis for treatment. Nasal washings and  aspirates are unacceptable for Xpert Xpress SARS-CoV-2/FLU/RSV  testing.  Fact Sheet for Patients: PinkCheek.be  Fact Sheet for Healthcare Providers: GravelBags.it  This test is not yet approved or cleared by the [place] FDA and  has been authorized for detection and/or diagnosis of SARS-CoV-2 by  FDA under an Emergency Use Authorization (EUA). This EUA will remain  in effect (meaning this test can be used) for the duration of the  Covid-19 declaration under Section 564(b)(1) of the Act, 21  U.S.C. section 360bbb-3(b)(1), unless the authorization is  terminated or revoked. Performed at [facility], [place], [place] [address]   C Difficile Quick Screen w PCR reflex     Status: None   Collection Time: 12/30/19  9:13 PM   Specimen: STOOL  Result Value Ref Range Status   C Diff antigen NEGATIVE NEGATIVE Final   C Diff toxin  NEGATIVE NEGATIVE Final   C Diff interpretation No C. difficile detected.  Final    Comment: Performed at [facility], [address] [place], [place] [address]  Gastrointestinal Panel by PCR , Stool     Status: None   Collection Time: 12/30/19  9:13 PM   Specimen: STOOL  Result Value Ref Range Status   Campylobacter species NOT DETECTED NOT DETECTED Final   Plesimonas shigelloides NOT DETECTED NOT DETECTED Final   Salmonella species NOT DETECTED NOT DETECTED Final   Yersinia enterocolitica NOT DETECTED NOT DETECTED Final   Vibrio species NOT DETECTED NOT DETECTED Final   Vibrio cholerae NOT DETECTED NOT DETECTED Final   Enteroaggregative E coli (EAEC) NOT DETECTED NOT DETECTED Final   Enteropathogenic E coli (EPEC) NOT DETECTED NOT DETECTED  Final   Enterotoxigenic E coli (ETEC) NOT DETECTED NOT DETECTED Final   Shiga like toxin producing E coli (STEC) NOT DETECTED NOT DETECTED Final   Shigella/Enteroinvasive E coli (EIEC) NOT DETECTED NOT DETECTED Final   Cryptosporidium NOT DETECTED NOT DETECTED Final   Cyclospora cayetanensis NOT DETECTED NOT DETECTED Final   Entamoeba histolytica NOT DETECTED NOT DETECTED Final   Giardia lamblia NOT DETECTED NOT DETECTED Final   Adenovirus F40/41 NOT DETECTED NOT DETECTED Final   Astrovirus NOT DETECTED NOT DETECTED Final   Norovirus GI/GII NOT DETECTED NOT DETECTED Final   Rotavirus A NOT DETECTED NOT DETECTED Final   Sapovirus (I, II, IV, and V) NOT DETECTED NOT DETECTED Final    Comment: Performed at [facility], [address] [place], [place] [address]     Studies: NM GI Blood Loss  Result Date: 12/31/2019 CLINICAL DATA:  Bright red blood per rectum on 12/30/2019 with intermittent bloody stool since. EXAM: NUCLEAR MEDICINE GASTROINTESTINAL BLEEDING SCAN TECHNIQUE: Sequential abdominal images were obtained following intravenous administration of Tc-20mlabeled red blood cells. RADIOPHARMACEUTICALS:  24.18 mCi Tc-922mpertechnetate in-vitro labeled red cells. COMPARISON:  Abdominopelvic CT 12/30/2019 FINDINGS: There is a good red cell blood tag with normal blood pool activity. No evidence of active gastrointestinal bleeding. There is mildly prominent asymmetric activity in the right lower quadrant which does not move or change throughout the study, potentially reflecting mild cecal angiodysplasia. IMPRESSION: 1. No evidence of active gastrointestinal bleeding. 2. Mildly asymmetric activity in the right lower quadrant, potentially reflecting cecal angiodysplasia. Electronically Signed   By: WiRichardean Sale.D.   On: 12/31/2019 15:11   CT ABDOMEN PELVIS W CONTRAST  Result Date: 12/30/2019 CLINICAL DATA:  [age]ear old female with sudden onset abdominal pain, nausea vomiting diarrhea last night. Bright red blood in stool this morning. EXAM: CT ABDOMEN AND PELVIS WITH CONTRAST TECHNIQUE: Multidetector CT imaging of the abdomen and pelvis was performed using the standard protocol following bolus administration of intravenous contrast. CONTRAST:  10046mMNIPAQUE IOHEXOL 300 MG/ML  SOLN COMPARISON:  Noncontrast CT Abdomen and Pelvis 05/13/2019 and earlier. FINDINGS: Lower chest: Stable mild lower lobe scarring. No cardiomegaly. No pericardial or pleural effusion. Hepatobiliary: Negative liver and gallbladder. Pancreas: Negative. Spleen: Negative. Adrenals/Urinary Tract: Normal adrenal glands. Bilateral renal enhancement and contrast excretion is symmetric and within normal limits. Normal proximal ureters. Mildly distended but otherwise unremarkable urinary bladder. No nephrolithiasis. Stomach/Bowel: The rectum is decompressed and relatively negative. Beginning in the mid sigmoid colon there is circumferential large bowel wall thickening, which becomes moderate to severe in the descending colon and is accompanied by long segment pericolonic mesenteric stranding (series 2, image 47). The changes persist through the splenic flexure.  The mid transverse colon begins to normalize, and the proximal transverse appears normal on series 2, image 32. Negative right colon. Oral contrast has reached the ascending colon. Diminutive or absent appendix. Negative terminal ileum. No dilated small bowel. Negative stomach and duodenum. No free air. No free fluid. No other mesenteric stranding. Vascular/Lymphatic: Major arterial structures are patent. Minimal atherosclerosis. Portal venous system is patent. No lymphadenopathy. Reproductive: Absent uterus.  Diminutive or absent ovaries. Other: No pelvic free fluid. Musculoskeletal: Stable mild T12 compression fracture. Chronic disc and endplate degeneration at the lumbosacral junction. No acute osseous abnormality identified. IMPRESSION: Long segment Acute Colitis from the splenic flexure to the sigmoid colon. No perforation, obstruction, or other complicating features. Electronically Signed   By: H  Genevie AnnD.   On: 12/30/2019 17:31  Scheduled Meds: . fluticasone furoate-vilanterol  1 puff Inhalation Daily  . gabapentin  800 mg Oral TID  . loratadine  10 mg Oral Daily  . montelukast  10 mg Oral QHS  . nortriptyline  10 mg Oral BID  . pantoprazole  40 mg Oral Daily  . traZODone  200 mg Oral QHS  . valACYclovir  1,000 mg Oral Daily   Continuous Infusions: . sodium chloride 50 mL/hr at 12/31/19 1022  . piperacillin-tazobactam 3.375 g (01/01/20 0520)    Assessment/Plan:  1. Acute left-sided colitis seen on CT scan.  C. difficile testing negative and stool comprehensive panel negative.  Patient empirically placed on Zosyn.  Since the patient still bleeding GI considering colonoscopy and endoscopy for tomorrow. 2. Left-sided abdominal pain.  As needed oral and IV medications.  Benadryl as needed for itching.  Claritin also prescribed. 3. Rectal bleeding.  Hemoglobin is yesterday stable will order another hemoglobin for 12 noon and again for tomorrow morning 4. Relative hypotension.  Holding  Norvasc.  IV fluid hydration. 5. Acute kidney injury.  Likely secondary to hypotension.  Continue IV fluids. 6. Chronic pain.  Continue IV and oral pain medications.    Code Status:     Code Status Orders  (From admission, onward)         Start     Ordered   12/30/19 2122  Full code  Continuous        12/30/19 2121        Code Status History    Date Active Date Inactive Code Status Order ID Comments User Context   02/18/2019 1542 02/20/2019 2014 Full Code [id number]  Ezekiel Slocumb, DO ED   06/05/2017 1753 06/08/2017 2016 Full Code [id number]  Bettey Costa, MD Inpatient   Advance Care Planning Activity    Advance Directive Documentation     Most Recent Value  Type of Advance Directive Living will, Healthcare Power of Attorney  Pre-existing out of facility DNR order (yellow form or pink MOST form) --  "MOST" Form in Place? --     Family Communication: Permission to speak in front of family at the bedside and on the phone Disposition Plan: Status is: Inpatient  Dispo: The patient is from: Home              Anticipated d/c is to: Home              Anticipated d/c date is: We will likely need a few more days here in the hospital              Patient currently requiring IV pain medications for abdominal pain.  Still being treated for acute colitis with IV antibiotics.  Gastroenterology considering endoscopy colonoscopy for tomorrow.  Consultants:  Gastroenterology  Antibiotics:  Zosyn  Time spent: 26 minutes  [place]

## 2020-01-02 ENCOUNTER — Inpatient Hospital Stay: Payer: 59 | Admitting: Anesthesiology

## 2020-01-02 ENCOUNTER — Encounter
Admission: EM | Disposition: A | Discharge status: Home / Self Care | Payer: Self-pay | Source: Home / Self Care | Attending: Internal Medicine

## 2020-01-02 ENCOUNTER — Encounter: Payer: Self-pay | Admitting: Internal Medicine

## 2020-01-02 DIAGNOSIS — N189 Chronic kidney disease, unspecified: Secondary | ICD-10-CM

## 2020-01-02 DIAGNOSIS — K625 Hemorrhage of anus and rectum: Secondary | ICD-10-CM | POA: Diagnosis not present

## 2020-01-02 DIAGNOSIS — R1032 Left lower quadrant pain: Secondary | ICD-10-CM | POA: Diagnosis not present

## 2020-01-02 DIAGNOSIS — I959 Hypotension, unspecified: Secondary | ICD-10-CM | POA: Diagnosis not present

## 2020-01-02 DIAGNOSIS — K529 Noninfective gastroenteritis and colitis, unspecified: Secondary | ICD-10-CM | POA: Diagnosis not present

## 2020-01-02 HISTORY — PX: COLONOSCOPY: SHX5424

## 2020-01-02 HISTORY — PX: ESOPHAGOGASTRODUODENOSCOPY: SHX5428

## 2020-01-02 LAB — BASIC METABOLIC PANEL
Anion gap: 9 (ref 5–15)
BUN: <5 mg/dL — ABNORMAL LOW (ref 8–23)
CO2: 25 mmol/L (ref 22–32)
Calcium: 8.8 mg/dL — ABNORMAL LOW (ref 8.9–10.3)
Chloride: 108 mmol/L (ref 98–111)
Creatinine, Ser: 1.02 mg/dL — ABNORMAL HIGH (ref 0.44–1.00)
GFR, Estimated: 59 mL/min — ABNORMAL LOW (ref >60)
Glucose, Bld: 92 mg/dL (ref 70–99)
Potassium: 4 mmol/L (ref 3.5–5.1)
Sodium: 142 mmol/L (ref 135–145)

## 2020-01-02 LAB — CBC
HCT: 32.9 % — ABNORMAL LOW (ref 36.0–46.0)
Hemoglobin: 10.6 g/dL — ABNORMAL LOW (ref 12.0–15.0)
MCH: 29.4 pg (ref 26.0–34.0)
MCHC: 32.2 g/dL (ref 30.0–36.0)
MCV: 91.1 fL (ref 80.0–100.0)
Platelets: 319 10*3/uL (ref 150–400)
RBC: 3.61 MIL/uL — ABNORMAL LOW (ref 3.87–5.11)
RDW: 13.1 % (ref 11.5–15.5)
WBC: 7.9 10*3/uL (ref 4.0–10.5)
nRBC: 0 % (ref 0.0–0.2)

## 2020-01-02 SURGERY — EGD (ESOPHAGOGASTRODUODENOSCOPY)
Anesthesia: General

## 2020-01-02 MED ORDER — LIDOCAINE HCL (CARDIAC) PF 100 MG/5ML IV SOSY
PREFILLED_SYRINGE | INTRAVENOUS | Status: DC | PRN
  Administered 2020-01-02: 100 mg via INTRAVENOUS

## 2020-01-02 MED ORDER — SODIUM CHLORIDE 0.9 % IV SOLN: INTRAVENOUS | Status: DC

## 2020-01-02 MED ORDER — PROPOFOL 500 MG/50ML IV EMUL
INTRAVENOUS | Status: DC | PRN
  Administered 2020-01-02: 155 ug/kg/min via INTRAVENOUS

## 2020-01-02 MED ORDER — PROPOFOL 500 MG/50ML IV EMUL
INTRAVENOUS | Status: AC
  Filled 2020-01-02: qty 100

## 2020-01-02 MED ORDER — PROPOFOL 10 MG/ML IV BOLUS
INTRAVENOUS | Status: AC
  Filled 2020-01-02: qty 20

## 2020-01-02 MED ORDER — HYDROMORPHONE HCL 1 MG/ML IJ SOLN
0.5000 mg | Freq: Four times a day (QID) | INTRAMUSCULAR | Status: DC | PRN
  Administered 2020-01-02 – 2020-01-03 (×2): 0.5 mg via INTRAVENOUS
  Filled 2020-01-02 (×2): qty 1

## 2020-01-02 MED ORDER — ALPRAZOLAM 0.25 MG PO TABS
0.2500 mg | ORAL_TABLET | Freq: Three times a day (TID) | ORAL | Status: DC | PRN
  Administered 2020-01-02: 0.25 mg via ORAL
  Filled 2020-01-02: qty 1

## 2020-01-02 MED ORDER — PROPOFOL 10 MG/ML IV BOLUS
INTRAVENOUS | Status: DC | PRN
  Administered 2020-01-02 (×2): 10 mg via INTRAVENOUS
  Administered 2020-01-02: 50 mg via INTRAVENOUS

## 2020-01-02 MED ORDER — GLYCOPYRROLATE 0.2 MG/ML IJ SOLN
INTRAMUSCULAR | Status: DC | PRN
  Administered 2020-01-02: .2 mg via INTRAVENOUS

## 2020-01-02 MED ORDER — ONDANSETRON HCL 4 MG/2ML IJ SOLN
4.0000 mg | Freq: Four times a day (QID) | INTRAMUSCULAR | Status: DC | PRN
  Administered 2020-01-02 – 2020-01-03 (×2): 4 mg via INTRAVENOUS
  Filled 2020-01-02 (×2): qty 2

## 2020-01-02 NOTE — Anesthesia Preprocedure Evaluation (Signed)
Anesthesia Evaluation  Patient identified by MRN, date of birth, ID band Patient awake    Reviewed: Allergy & Precautions, H&P , NPO status , Patient's Chart, lab work & pertinent test results  History of Anesthesia Complications Negative for: history of anesthetic complications  Airway Mallampati: III  TM Distance: >3 FB Neck ROM: limited    Dental  (+) Chipped, Poor Dentition, Missing, Edentulous Upper   Pulmonary pneumonia, COPD,  oxygen dependent,    Pulmonary exam normal        Cardiovascular Exercise Tolerance: Good hypertension, Normal cardiovascular exam     Neuro/Psych  Headaches, PSYCHIATRIC DISORDERS  Neuromuscular disease    GI/Hepatic Neg liver ROS, PUD, GERD  Medicated and Controlled,  Endo/Other  negative endocrine ROS  Renal/GU CRFRenal disease  negative genitourinary   Musculoskeletal  (+) Arthritis ,   Abdominal   Peds  Hematology negative hematology ROS (+)   Anesthesia Other Findings Past Medical History: No date: Acid reflux No date: Anxiety No date: Arthritis 09/2015: Bronchitis No date: Gross hematuria No date: H. pylori infection No date: Heart murmur No date: Kidney failure 01/06/2018: Labile essential hypertension No date: Lung abnormality     Comment:  "damaged lung due to pneumonia" No date: Neck pain No date: Nephrolithiasis No date: Pneumonia No date: Pneumothorax No date: Scoliosis No date: Urinary frequency No date: Vaginal atrophy  Past Surgical History: No date: ABDOMINAL HYSTERECTOMY     Comment:  vaginal No date: APPENDECTOMY 12/29/2018: COLONOSCOPY WITH PROPOFOL; N/A     Comment:  Procedure: COLONOSCOPY WITH PROPOFOL;  Surgeon: Lucilla Lame, MD;  Location: [facility] ENDOSCOPY;  Service:               Endoscopy;  Laterality: N/A; No date: FOOT SURGERY; Left     Comment:  10/2015 No date: NECK SURGERY  BMI    Body Mass Index: 25.46 kg/m       Reproductive/Obstetrics negative OB ROS                             Anesthesia Physical Anesthesia Plan  ASA: IV  Anesthesia Plan: General   Post-op Pain Management:    Induction: Intravenous  PONV Risk Score and Plan: Propofol infusion and TIVA  Airway Management Planned: Natural Airway and Nasal Cannula  Additional Equipment:   Intra-op Plan:   Post-operative Plan:   Informed Consent: I have reviewed the patients History and Physical, chart, labs and discussed the procedure including the risks, benefits and alternatives for the proposed anesthesia with the patient or authorized representative who has indicated his/her understanding and acceptance.     Dental Advisory Given  Plan Discussed with: Anesthesiologist, CRNA and Surgeon  Anesthesia Plan Comments: (Patient consented for risks of anesthesia including but not limited to:  - adverse reactions to medications - risk of intubation if required - damage to eyes, teeth, lips or other oral mucosa - nerve damage due to positioning  - sore throat or hoarseness - Damage to heart, brain, nerves, lungs, other parts of body or loss of life  Patient voiced understanding.)        Anesthesia Quick Evaluation

## 2020-01-02 NOTE — Transfer of Care (Signed)
Immediate Anesthesia Transfer of Care Note  Patient: Rachael Jordan  Procedure(s) Performed: ESOPHAGOGASTRODUODENOSCOPY (EGD) (N/A ) COLONOSCOPY (N/A )  Patient Location: PACU  Anesthesia Type:General  Level of Consciousness: awake, drowsy and patient cooperative  Airway & Oxygen Therapy: Patient Spontanous Breathing  Post-op Assessment: Report given to RN and Post -op Vital signs reviewed and stable  Post vital signs: Reviewed and stable  Last Vitals:  Vitals Value Taken Time  BP 116/75 01/02/20 0903  Temp    Pulse 106 01/02/20 0903  Resp 13 01/02/20 0903  SpO2 100 % 01/02/20 0903  Vitals shown include unvalidated device data.  Last Pain:  Vitals:   01/02/20 0437  TempSrc: Oral  PainSc:       Patients Stated Pain Goal: 3 (68/85/20 7409)  Complications: No complications documented.

## 2020-01-02 NOTE — Anesthesia Postprocedure Evaluation (Signed)
Anesthesia Post Note  Patient: Shenique Ascencio  Procedure(s) Performed: ESOPHAGOGASTRODUODENOSCOPY (EGD) (N/A ) COLONOSCOPY (N/A )  Patient location during evaluation: PACU Anesthesia Type: General Level of consciousness: awake and alert Pain management: pain level controlled Vital Signs Assessment: post-procedure vital signs reviewed and stable Respiratory status: spontaneous breathing, nonlabored ventilation, respiratory function stable and patient connected to nasal cannula oxygen Cardiovascular status: blood pressure returned to baseline and stable Postop Assessment: no apparent nausea or vomiting Anesthetic complications: no   No complications documented.   Last Vitals:  Vitals:   01/02/20 0923 01/02/20 0930  BP: 117/72 118/71  Pulse: 100 95  Resp: 14 (!) 21  Temp:  36.8 C  SpO2: 100% 100%    Last Pain:  Vitals:   01/02/20 0913  TempSrc:   PainSc: Asleep                 Precious Haws Jocelyne Reinertsen

## 2020-01-02 NOTE — Progress Notes (Signed)
[name], female   DOB: 08-22-1955, 64 y.o.   MRN: [id number] [facility] PROGRESS NOTE  Mozetta Murfin RFF:[id number] DOB: 1955-12-13 DOA: 12/30/2019 PCP: Sharyne Peach, MD  HPI/Subjective: Patient seen after colonoscopy.  Was having pain in her left abdomen.  Initially with colonoscopy prep did see some blood but then turned brown.  No nausea or vomiting.  Colitis seen on colonoscopy.  Objective: Vitals:   01/02/20 0923 01/02/20 0930  BP: 117/72 118/71  Pulse: 100 95  Resp: 14 (!) 21  Temp:  98.2 F (36.8 C)  SpO2: 100% 100%    Intake/Output Summary (Last 24 hours) at 01/02/2020 1137 Last data filed at 01/02/2020 3570 Gross per 24 hour  Intake 1171.96 ml  Output 400 ml  Net 771.96 ml   Filed Weights   12/30/19 1210  Weight: 69.4 kg    ROS: Review of Systems  Respiratory: Negative for cough and shortness of breath.   Cardiovascular: Negative for chest pain.  Gastrointestinal: Positive for abdominal pain and diarrhea. Negative for nausea and vomiting.   Exam: Physical Exam HENT:     Head: Normocephalic.     Mouth/Throat:     Pharynx: No oropharyngeal exudate.  Eyes:     General: Lids are normal.     Conjunctiva/sclera: Conjunctivae normal.     Pupils: Pupils are equal, round, and reactive to light.  Cardiovascular:     Rate and Rhythm: Normal rate and regular rhythm.     Heart sounds: Normal heart sounds, S1 normal and S2 normal.  Pulmonary:     Breath sounds: No decreased breath sounds, wheezing, rhonchi or rales.  Abdominal:     Palpations: Abdomen is soft.     Tenderness: There is abdominal tenderness in the left lower quadrant.  Musculoskeletal:     Right lower leg: No swelling.     Left lower leg: No swelling.  Skin:    General: Skin is warm.     Findings: No rash.  Neurological:     Mental Status: She is alert and oriented to person, place, and time.       Data Reviewed: Basic Metabolic Panel: Recent Labs  Lab  12/30/19 1217 12/31/19 0453 01/01/20 0459 01/02/20 0611  NA 138 138 139 142  K 3.7 3.9 3.9 4.0  CL 102 104 106 108  CO2 24 27 25 25   GLUCOSE 108* 104* 83 92  BUN 12 9 7* <5*  CREATININE 0.96 1.01* 1.07* 1.02*  CALCIUM 9.5 8.7* 8.3* 8.8*  MG 2.2 2.1  --   --   PHOS  --  4.5  --   --    Liver Function Tests: Recent Labs  Lab 12/30/19 1217 12/31/19 0453  AST 24 19  ALT 18 14  ALKPHOS 59 54  BILITOT 0.7 0.7  PROT 8.1 6.9  ALBUMIN 4.3 3.7   Recent Labs  Lab 12/30/19 1217  LIPASE 25   CBC: Recent Labs  Lab 12/31/19 0453 12/31/19 0453 12/31/19 1019 12/31/19 1517 12/31/19 2017 01/01/20 1154 01/02/20 0611  WBC 15.6*  --  16.1* 17.4* 15.2*  --  7.9  NEUTROABS 11.3*  --   --   --   --   --   --   HGB 11.4*   < > 11.2* 11.8* 11.2* 10.5* 10.6*  HCT 35.3*  --  34.4* 36.1 34.5*  --  32.9*  MCV 91.5  --  91.2 92.8 91.5  --  91.1  PLT 365  --  378 337 339  --  319   < > = values in this interval not displayed.   Cardiac Enzymes: Recent Labs  Lab 12/30/19 1217  CKTOTAL 79     Recent Results (from the past 240 hour(s))  Respiratory Panel by RT PCR (Flu A&B, Covid) - Nasopharyngeal Swab     Status: None   Collection Time: 12/30/19  7:30 PM   Specimen: Nasopharyngeal Swab  Result Value Ref Range Status   SARS Coronavirus 2 by RT PCR NEGATIVE NEGATIVE Final    Comment: (NOTE) SARS-CoV-2 target nucleic acids are NOT DETECTED.  The SARS-CoV-2 RNA is generally detectable in upper respiratoy specimens during the acute phase of infection. The lowest concentration of SARS-CoV-2 viral copies this assay can detect is 131 copies/mL. A negative result does not preclude SARS-Cov-2 infection and should not be used as the sole basis for treatment or other patient management decisions. A negative result may occur with  improper specimen collection/handling, submission of specimen other than nasopharyngeal swab, presence of viral mutation(s) within the areas targeted by this  assay, and inadequate number of viral copies (<131 copies/mL). A negative result must be combined with clinical observations, patient history, and epidemiological information. The expected result is Negative.  Fact Sheet for Patients:  PinkCheek.be  Fact Sheet for Healthcare Providers:  GravelBags.it  This test is no t yet approved or cleared by the [place] FDA and  has been authorized for detection and/or diagnosis of SARS-CoV-2 by FDA under an Emergency Use Authorization (EUA). This EUA will remain  in effect (meaning this test can be used) for the duration of the COVID-19 declaration under Section 564(b)(1) of the Act, 21 U.S.C. section 360bbb-3(b)(1), unless the authorization is terminated or revoked sooner.     Influenza A by PCR NEGATIVE NEGATIVE Final   Influenza B by PCR NEGATIVE NEGATIVE Final    Comment: (NOTE) The Xpert Xpress SARS-CoV-2/FLU/RSV assay is intended as an aid in  the diagnosis of influenza from Nasopharyngeal swab specimens and  should not be used as a sole basis for treatment. Nasal washings and  aspirates are unacceptable for Xpert Xpress SARS-CoV-2/FLU/RSV  testing.  Fact Sheet for Patients: PinkCheek.be  Fact Sheet for Healthcare Providers: GravelBags.it  This test is not yet approved or cleared by the [place] FDA and  has been authorized for detection and/or diagnosis of SARS-CoV-2 by  FDA under an Emergency Use Authorization (EUA). This EUA will remain  in effect (meaning this test can be used) for the duration of the  Covid-19 declaration under Section 564(b)(1) of the Act, 21  U.S.C. section 360bbb-3(b)(1), unless the authorization is  terminated or revoked. Performed at [facility] [place] [facility], [place], [place] [address]   C Difficile Quick Screen w PCR reflex     Status: None   Collection Time:  12/30/19  9:13 PM   Specimen: STOOL  Result Value Ref Range Status   C Diff antigen NEGATIVE NEGATIVE Final   C Diff toxin NEGATIVE NEGATIVE Final   C Diff interpretation No C. difficile detected.  Final    Comment: Performed at [facility], [address] [place], [place] [address]  Gastrointestinal Panel by PCR , Stool     Status: None   Collection Time: 12/30/19  9:13 PM   Specimen: STOOL  Result Value Ref Range Status   Campylobacter species NOT DETECTED NOT DETECTED Final   Plesimonas shigelloides NOT DETECTED NOT DETECTED Final   Salmonella species NOT  DETECTED NOT DETECTED Final   Yersinia enterocolitica NOT DETECTED NOT DETECTED Final   Vibrio species NOT DETECTED NOT DETECTED Final   Vibrio cholerae NOT DETECTED NOT DETECTED Final   Enteroaggregative E coli (EAEC) NOT DETECTED NOT DETECTED Final   Enteropathogenic E coli (EPEC) NOT DETECTED NOT DETECTED Final   Enterotoxigenic E coli (ETEC) NOT DETECTED NOT DETECTED Final   Shiga like toxin producing E coli (STEC) NOT DETECTED NOT DETECTED Final   Shigella/Enteroinvasive E coli (EIEC) NOT DETECTED NOT DETECTED Final   Cryptosporidium NOT DETECTED NOT DETECTED Final   Cyclospora cayetanensis NOT DETECTED NOT DETECTED Final   Entamoeba histolytica NOT DETECTED NOT DETECTED Final   Giardia lamblia NOT DETECTED NOT DETECTED Final   Adenovirus F40/41 NOT DETECTED NOT DETECTED Final   Astrovirus NOT DETECTED NOT DETECTED Final   Norovirus GI/GII NOT DETECTED NOT DETECTED Final   Rotavirus A NOT DETECTED NOT DETECTED Final   Sapovirus (I, II, IV, and V) NOT DETECTED NOT DETECTED Final    Comment: Performed at [facility], [address] [place], [place] [address]     Studies: NM GI Blood Loss  Result Date: 12/31/2019 CLINICAL DATA:  Bright red blood per rectum on 12/30/2019 with intermittent bloody stool since. EXAM: NUCLEAR MEDICINE GASTROINTESTINAL BLEEDING SCAN TECHNIQUE: Sequential abdominal images  were obtained following intravenous administration of Tc-42mlabeled red blood cells. RADIOPHARMACEUTICALS:  24.18 mCi Tc-927mertechnetate in-vitro labeled red cells. COMPARISON:  Abdominopelvic CT 12/30/2019 FINDINGS: There is a good red cell blood tag with normal blood pool activity. No evidence of active gastrointestinal bleeding. There is mildly prominent asymmetric activity in the right lower quadrant which does not move or change throughout the study, potentially reflecting mild cecal angiodysplasia. IMPRESSION: 1. No evidence of active gastrointestinal bleeding. 2. Mildly asymmetric activity in the right lower quadrant, potentially reflecting cecal angiodysplasia. Electronically Signed   By: WiRichardean Sale.D.   On: 12/31/2019 15:11    Scheduled Meds: . fluticasone furoate-vilanterol  1 puff Inhalation Daily  . gabapentin  800 mg Oral TID  . loratadine  10 mg Oral Daily  . montelukast  10 mg Oral QHS  . nortriptyline  10 mg Oral BID  . pantoprazole  40 mg Oral Daily  . traZODone  200 mg Oral QHS  . valACYclovir  1,000 mg Oral Daily   Continuous Infusions: . sodium chloride 50 mL/hr at 12/31/19 1022  . piperacillin-tazobactam 3.375 g (01/02/20 0538)    Assessment/Plan:  1. Acute left-sided colitis.  CT scanning showed colitis.  Colonoscopy today also showed colitis and this was biopsied.  Question whether this is infectious versus ischemic versus microscopic colitis.  Await biopsy results.  Patient empirically on Zosyn.  Endoscopy negative. 2. Left-sided abdominal pain secondary to colitis.  As needed oral and IV pain medications.  Benadryl as needed for itching.  Claritin also prescribed. 3. Rectal bleeding from colitis.  Hemoglobin trending on the lower.  Will discontinue fluids if able to tolerate advance diet today.  Hemoglobin 10.6 today. 4. Relative hypotension for the last couple days.  Continue to hold Norvasc.  Blood pressure coming up. 5. Acute kidney injury on chronic  kidney disease stage II secondary to relative hypotension. 6. Chronic pain.  Continue IV and oral pain medication     Code Status:     Code Status Orders  (From admission, onward)         Start     Ordered   12/30/19 2122  Full code  Continuous        12/30/19 2121        Code Status History    Date Active Date Inactive Code Status Order ID Comments User Context   02/18/2019 1542 02/20/2019 2014 Full Code [id number]  Ezekiel Slocumb, DO ED   06/05/2017 1753 06/08/2017 2016 Full Code [id number]  Bettey Costa, MD Inpatient   Advance Care Planning Activity    Advance Directive Documentation     Most Recent Value  Type of Advance Directive Living will, Healthcare Power of Attorney  Pre-existing out of facility DNR order (yellow form or pink MOST form) --  "MOST" Form in Place? --     Family Communication: Sister on the phone Disposition Plan: Status is: Inpatient  Dispo: The patient is from: Home              Anticipated d/c is to: Home              Anticipated d/c date is: From this point on evaluate on a daily basis on her symptoms.              Patient currently will be advanced on her diet and monitoring for her abdominal pain and rectal bleeding.  Consultants:  Gastroenterology  Procedures:  EGD and colonoscopy  Antibiotics:  Zosyn  Time spent: 74 minutes  Jarica Plass [employer]

## 2020-01-02 NOTE — Op Note (Signed)
[facility] Gastroenterology Patient Name: Rachael Jordan Procedure Date: 01/02/2020 7:10 AM MRN: [id number] Account #: [id number] Date of Birth: 02/26/1956 Admit Type: Inpatient Age: 64 Room: [facility] ENDO ROOM 2 Gender: Female Note Status: Finalized Procedure:             Colonoscopy Indications:           Lower abdominal pain, Hematochezia, Acute post                         hemorrhagic anemia Providers:             Benay Pike. Alice Reichert MD, MD Referring MD:          Rubbie Battiest. Iona Beard MD, MD (Referring MD) Medicines:             Propofol per Anesthesia Complications:         No immediate complications. Estimated blood loss: None. Procedure:             Pre-Anesthesia Assessment:                        - The risks and benefits of the procedure and the                         sedation options and risks were discussed with the                         patient. All questions were answered and informed                         consent was obtained.                        - Patient identification and proposed procedure were                         verified prior to the procedure by the nurse. The                         procedure was verified in the procedure room.                        - ASA Grade Assessment: III - A patient with severe                         systemic disease.                        - After reviewing the risks and benefits, the patient                         was deemed in satisfactory condition to undergo the                         procedure.                        After obtaining informed consent, the colonoscope was                         passed under direct  vision. Throughout the procedure,                         the patient's blood pressure, pulse, and oxygen                         saturations were monitored continuously. The                         Colonoscope was introduced through the anus and                         advanced to the the  cecum, identified by appendiceal                         orifice and ileocecal valve. The colonoscopy was                         somewhat difficult due to significant looping.                         Successful completion of the procedure was aided by                         changing the patient to a prone position. The patient                         tolerated the procedure well. The quality of the bowel                         preparation was adequate. The ileocecal valve,                         appendiceal orifice, and rectum were photographed. Findings:      The perianal and digital rectal examinations were normal. Pertinent       negatives include normal sphincter tone and no palpable rectal lesions.      Non-bleeding internal hemorrhoids were found during retroflexion. The       hemorrhoids were Grade I (internal hemorrhoids that do not prolapse).      A few small-mouthed diverticula were found in the mid sigmoid colon and       distal sigmoid colon.      Segmental moderate inflammation characterized by altered vascularity,       congestion (edema) and erythema was found in the proximal sigmoid colon.       Biopsies were taken with a cold forceps for histology.      The exam was otherwise without abnormality on direct and retroflexion       views.      There is no endoscopic evidence of bleeding, mass, polyps or ulcerations       in the entire colon.      The exam was otherwise without abnormality on direct and retroflexion       views. Impression:            - Non-bleeding internal hemorrhoids.                        - Diverticulosis in the mid sigmoid colon and in the  distal sigmoid colon.                        - Segmental moderate inflammation was found in the                         proximal sigmoid colon secondary to ischemic colitis.                         Biopsied.                        - The examination was otherwise normal on direct and                          retroflexion views. Recommendation:        - Return patient to hospital ward for possible                         discharge same day.                        - Await pathology results.                        - Advance diet as tolerated - advance as tolerated to                         resume regular diet.                        - Await pathology results.                        - Return to GI office PRN.                        - The findings and recommendations were discussed with                         the patient. Procedure Code(s):     --- Professional ---                        [id number], Colonoscopy, flexible; with biopsy, single or                         multiple Diagnosis Code(s):     --- Professional ---                        K57.30, Diverticulosis of large intestine without                         perforation or abscess without bleeding                        D62, Acute posthemorrhagic anemia                        K92.1, Melena (includes Hematochezia)  R10.30, Lower abdominal pain, unspecified                        K55.9, Vascular disorder of intestine, unspecified                        K64.0, First degree hemorrhoids CPT copyright 2019 American Medical Association. All rights reserved. The codes documented in this report are preliminary and upon coder review may  be revised to meet current compliance requirements. Efrain Sella MD, MD 01/02/2020 9:58:08 AM This report has been signed electronically. Number of Addenda: 0 Note Initiated On: 01/02/2020 7:10 AM Scope Withdrawal Time: 0 hours 4 minutes 36 seconds  Total Procedure Duration: 0 hours 20 minutes 35 seconds  Estimated Blood Loss:  Estimated blood loss: none.      [facility]

## 2020-01-02 NOTE — Op Note (Addendum)
[facility] Gastroenterology Patient Name: Rachael Jordan Procedure Date: 01/02/2020 7:15 AM MRN: [id number] Account #: [id number] Date of Birth: 07-03-1955 Admit Type: Inpatient Age: 64 Room: [facility] ENDO ROOM 2 Gender: Female Note Status: Finalized Procedure:             Upper GI endoscopy Indications:           Acute post hemorrhagic anemia, Melena Providers:             Benay Pike. Alice Reichert MD, MD Referring MD:          Rubbie Battiest. Iona Beard MD, MD (Referring MD) Medicines:             Propofol per Anesthesia Complications:         No immediate complications. Procedure:             Pre-Anesthesia Assessment:                        - The risks and benefits of the procedure and the                         sedation options and risks were discussed with the                         patient. All questions were answered and informed                         consent was obtained.                        - Patient identification and proposed procedure were                         verified prior to the procedure by the nurse. The                         procedure was verified in the procedure room.                        - ASA Grade Assessment: III - A patient with severe                         systemic disease.                        - After reviewing the risks and benefits, the patient                         was deemed in satisfactory condition to undergo the                         procedure.                        After obtaining informed consent, the endoscope was                         passed under direct vision. Throughout the procedure,                         the  patient's blood pressure, pulse, and oxygen                         saturations were monitored continuously. The Endoscope                         was introduced through the mouth, and advanced to the                         third part of duodenum. The upper GI endoscopy was                          accomplished without difficulty. The patient tolerated                         the procedure well. Findings:      The esophagus was normal.      The stomach was normal.      The examined duodenum was normal. Impression:            - Normal esophagus.                        - Normal stomach.                        - Normal examined duodenum.                        - No specimens collected. Recommendation:        - Proceed with colonoscopy Procedure Code(s):     --- Professional ---                        [phone], Esophagogastroduodenoscopy, flexible,                         transoral; diagnostic, including collection of                         specimen(s) by brushing or washing, when performed                         (separate procedure) Diagnosis Code(s):     --- Professional ---                        K92.1, Melena (includes Hematochezia)                        D62, Acute posthemorrhagic anemia CPT copyright 2019 American Medical Association. All rights reserved. The codes documented in this report are preliminary and upon coder review may  be revised to meet current compliance requirements. Efrain Sella MD, MD 01/02/2020 8:30:46 AM This report has been signed electronically. Number of Addenda: 0 Note Initiated On: 01/02/2020 7:15 AM Estimated Blood Loss:  Estimated blood loss: none.      [facility]

## 2020-01-02 NOTE — Interval H&P Note (Signed)
History and Physical Interval Note:  01/02/2020 8:22 AM  Rachael Jordan  has presented today for surgery, with the diagnosis of Acute left-sided colitis, melena, anemia.  The various methods of treatment have been discussed with the patient and family. After consideration of risks, benefits and other options for treatment, the patient has consented to  Procedure(s): ESOPHAGOGASTRODUODENOSCOPY (EGD) (N/A) COLONOSCOPY (N/A) as a surgical intervention.  The patient's history has been reviewed, patient examined, no change in status, stable for surgery.  I have reviewed the patient's chart and labs.  Questions were answered to the patient's satisfaction.     Corder, [place]

## 2020-01-02 NOTE — Anesthesia Procedure Notes (Signed)
Procedure Name: General with mask airway Performed by: Fletcher-Harrison, Andromeda Poppen, CRNA Pre-anesthesia Checklist: Patient identified, Emergency Drugs available, Suction available and Patient being monitored Patient Re-evaluated:Patient Re-evaluated prior to induction Oxygen Delivery Method: Simple face mask Induction Type: IV induction Placement Confirmation: positive ETCO2 and CO2 detector Dental Injury: Teeth and Oropharynx as per pre-operative assessment        

## 2020-01-03 ENCOUNTER — Encounter: Payer: Self-pay | Admitting: Internal Medicine

## 2020-01-03 DIAGNOSIS — R1032 Left lower quadrant pain: Secondary | ICD-10-CM | POA: Diagnosis not present

## 2020-01-03 DIAGNOSIS — K625 Hemorrhage of anus and rectum: Secondary | ICD-10-CM | POA: Diagnosis not present

## 2020-01-03 DIAGNOSIS — I959 Hypotension, unspecified: Secondary | ICD-10-CM | POA: Diagnosis not present

## 2020-01-03 DIAGNOSIS — K529 Noninfective gastroenteritis and colitis, unspecified: Secondary | ICD-10-CM | POA: Diagnosis not present

## 2020-01-03 LAB — HEMOGLOBIN: Hemoglobin: 9.2 g/dL — ABNORMAL LOW (ref 12.0–15.0)

## 2020-01-03 MED ORDER — HYDROCODONE-ACETAMINOPHEN 5-325 MG PO TABS
1.0000 | ORAL_TABLET | ORAL | Status: DC | PRN
  Administered 2020-01-03: 22:00:00 1 via ORAL
  Filled 2020-01-03: qty 1

## 2020-01-03 MED ORDER — SODIUM CHLORIDE 0.9 % IV SOLN
INTRAVENOUS | Status: DC | PRN
  Administered 2020-01-03: 500 mL via INTRAVENOUS

## 2020-01-03 NOTE — Progress Notes (Signed)
[name], female   DOB: 09/02/1955, 64 y.o.   MRN: [id number] [facility] PROGRESS NOTE  Halana Deisher EYC:[id number] DOB: May 24, 1955 DOA: 12/30/2019 PCP: Sharyne Peach, MD  HPI/Subjective: Patient this morning did not eat very well.  Still having a lot of lower abdominal pain.  Has not had a bowel movement since colonoscopy.  Admitted with colitis.  Objective: Vitals:   01/03/20 0816 01/03/20 1050  BP: 128/73 117/76  Pulse: 86 69  Resp: 20 15  Temp: 98.4 F (36.9 C) 98.5 F (36.9 C)  SpO2: 99% 96%    Intake/Output Summary (Last 24 hours) at 01/03/2020 1502 Last data filed at 01/03/2020 1029 Gross per 24 hour  Intake 240 ml  Output --  Net 240 ml   Filed Weights   12/30/19 1210  Weight: 69.4 kg    ROS: Review of Systems  Respiratory: Negative for cough and shortness of breath.   Cardiovascular: Negative for chest pain.  Gastrointestinal: Positive for abdominal pain and nausea. Negative for vomiting.   Exam: Physical Exam HENT:     Head: Normocephalic.     Mouth/Throat:     Pharynx: No oropharyngeal exudate.  Eyes:     General: Lids are normal.     Conjunctiva/sclera: Conjunctivae normal.     Pupils: Pupils are equal, round, and reactive to light.  Cardiovascular:     Rate and Rhythm: Normal rate and regular rhythm.     Heart sounds: Normal heart sounds, S1 normal and S2 normal.  Pulmonary:     Breath sounds: No decreased breath sounds, wheezing, rhonchi or rales.  Abdominal:     Palpations: Abdomen is soft.     Tenderness: There is abdominal tenderness in the left upper quadrant and left lower quadrant.  Musculoskeletal:     Right lower leg: No swelling.     Left lower leg: No swelling.  Skin:    General: Skin is warm.     Findings: No rash.  Neurological:     Mental Status: She is alert and oriented to person, place, and time.       Data Reviewed: Basic Metabolic Panel: Recent Labs  Lab 12/30/19 1217  12/31/19 0453 01/01/20 0459 01/02/20 0611  NA 138 138 139 142  K 3.7 3.9 3.9 4.0  CL 102 104 106 108  CO2 24 27 25 25   GLUCOSE 108* 104* 83 92  BUN 12 9 7* <5*  CREATININE 0.96 1.01* 1.07* 1.02*  CALCIUM 9.5 8.7* 8.3* 8.8*  MG 2.2 2.1  --   --   PHOS  --  4.5  --   --    Liver Function Tests: Recent Labs  Lab 12/30/19 1217 12/31/19 0453  AST 24 19  ALT 18 14  ALKPHOS 59 54  BILITOT 0.7 0.7  PROT 8.1 6.9  ALBUMIN 4.3 3.7   Recent Labs  Lab 12/30/19 1217  LIPASE 25   CBC: Recent Labs  Lab 12/31/19 0453 12/31/19 0453 12/31/19 1019 12/31/19 1019 12/31/19 1517 12/31/19 2017 01/01/20 1154 01/02/20 0611 01/03/20 0304  WBC 15.6*  --  16.1*  --  17.4* 15.2*  --  7.9  --   NEUTROABS 11.3*  --   --   --   --   --   --   --   --   HGB 11.4*   < > 11.2*   < > 11.8* 11.2* 10.5* 10.6* 9.2*  HCT 35.3*  --  34.4*  --  36.1 34.5*  --  32.9*  --   MCV 91.5  --  91.2  --  92.8 91.5  --  91.1  --   PLT 365  --  378  --  337 339  --  319  --    < > = values in this interval not displayed.   Cardiac Enzymes: Recent Labs  Lab 12/30/19 1217  CKTOTAL 79     Recent Results (from the past 240 hour(s))  Respiratory Panel by RT PCR (Flu A&B, Covid) - Nasopharyngeal Swab     Status: None   Collection Time: 12/30/19  7:30 PM   Specimen: Nasopharyngeal Swab  Result Value Ref Range Status   SARS Coronavirus 2 by RT PCR NEGATIVE NEGATIVE Final    Comment: (NOTE) SARS-CoV-2 target nucleic acids are NOT DETECTED.  The SARS-CoV-2 RNA is generally detectable in upper respiratoy specimens during the acute phase of infection. The lowest concentration of SARS-CoV-2 viral copies this assay can detect is 131 copies/mL. A negative result does not preclude SARS-Cov-2 infection and should not be used as the sole basis for treatment or other patient management decisions. A negative result may occur with  improper specimen collection/handling, submission of specimen other than  nasopharyngeal swab, presence of viral mutation(s) within the areas targeted by this assay, and inadequate number of viral copies (<131 copies/mL). A negative result must be combined with clinical observations, patient history, and epidemiological information. The expected result is Negative.  Fact Sheet for Patients:  PinkCheek.be  Fact Sheet for Healthcare Providers:  GravelBags.it  This test is no t yet approved or cleared by the [place] FDA and  has been authorized for detection and/or diagnosis of SARS-CoV-2 by FDA under an Emergency Use Authorization (EUA). This EUA will remain  in effect (meaning this test can be used) for the duration of the COVID-19 declaration under Section 564(b)(1) of the Act, 21 U.S.C. section 360bbb-3(b)(1), unless the authorization is terminated or revoked sooner.     Influenza A by PCR NEGATIVE NEGATIVE Final   Influenza B by PCR NEGATIVE NEGATIVE Final    Comment: (NOTE) The Xpert Xpress SARS-CoV-2/FLU/RSV assay is intended as an aid in  the diagnosis of influenza from Nasopharyngeal swab specimens and  should not be used as a sole basis for treatment. Nasal washings and  aspirates are unacceptable for Xpert Xpress SARS-CoV-2/FLU/RSV  testing.  Fact Sheet for Patients: PinkCheek.be  Fact Sheet for Healthcare Providers: GravelBags.it  This test is not yet approved or cleared by the [place] FDA and  has been authorized for detection and/or diagnosis of SARS-CoV-2 by  FDA under an Emergency Use Authorization (EUA). This EUA will remain  in effect (meaning this test can be used) for the duration of the  Covid-19 declaration under Section 564(b)(1) of the Act, 21  U.S.C. section 360bbb-3(b)(1), unless the authorization is  terminated or revoked. Performed at [facility], [place], [place]  [address]   C Difficile Quick Screen w PCR reflex     Status: None   Collection Time: 12/30/19  9:13 PM   Specimen: STOOL  Result Value Ref Range Status   C Diff antigen NEGATIVE NEGATIVE Final   C Diff toxin NEGATIVE NEGATIVE Final   C Diff interpretation No C. difficile detected.  Final    Comment: Performed at [facility], [address] [place], [place] [address]  Gastrointestinal Panel by PCR , Stool     Status: None   Collection Time: 12/30/19  9:13 PM   Specimen: STOOL  Result Value Ref Range Status   Campylobacter species NOT DETECTED NOT DETECTED Final   Plesimonas shigelloides NOT DETECTED NOT DETECTED Final   Salmonella species NOT DETECTED NOT DETECTED Final   Yersinia enterocolitica NOT DETECTED NOT DETECTED Final   Vibrio species NOT DETECTED NOT DETECTED Final   Vibrio cholerae NOT DETECTED NOT DETECTED Final   Enteroaggregative E coli (EAEC) NOT DETECTED NOT DETECTED Final   Enteropathogenic E coli (EPEC) NOT DETECTED NOT DETECTED Final   Enterotoxigenic E coli (ETEC) NOT DETECTED NOT DETECTED Final   Shiga like toxin producing E coli (STEC) NOT DETECTED NOT DETECTED Final   Shigella/Enteroinvasive E coli (EIEC) NOT DETECTED NOT DETECTED Final   Cryptosporidium NOT DETECTED NOT DETECTED Final   Cyclospora cayetanensis NOT DETECTED NOT DETECTED Final   Entamoeba histolytica NOT DETECTED NOT DETECTED Final   Giardia lamblia NOT DETECTED NOT DETECTED Final   Adenovirus F40/41 NOT DETECTED NOT DETECTED Final   Astrovirus NOT DETECTED NOT DETECTED Final   Norovirus GI/GII NOT DETECTED NOT DETECTED Final   Rotavirus A NOT DETECTED NOT DETECTED Final   Sapovirus (I, II, IV, and V) NOT DETECTED NOT DETECTED Final    Comment: Performed at [facility], [address] [place], [place] [address]      Scheduled Meds: . fluticasone furoate-vilanterol  1 puff Inhalation Daily  . gabapentin  800 mg Oral TID  . loratadine  10 mg Oral Daily  . montelukast   10 mg Oral QHS  . nortriptyline  10 mg Oral BID  . pantoprazole  40 mg Oral Daily  . traZODone  200 mg Oral QHS  . valACYclovir  1,000 mg Oral Daily   Continuous Infusions: . sodium chloride 500 mL (01/03/20 1406)  . piperacillin-tazobactam 3.375 g (01/03/20 1408)    Assessment/Plan:  1. Acute left-sided colitis.  Patient had bloody diarrhea.  Stool comprehensive panel and C. difficile negative.  Colonoscopy yesterday showed colitis and this was biopsied.  Biopsies still pending.  Patient still with a lot of abdominal pain and did not eat well this morning.  Continue to monitor today and reassess tomorrow for potential disposition. 2. Left-sided abdominal pain secondary to colitis.  Continue as needed oral pain medications.  Benadryl as needed for itching.  Claritin also prescribed. 3. Rectal bleeding from colitis.  Hemoglobin trended down to 9.2 today.  Recheck again tomorrow. 4. Relative hypotension during the hospital course.  Blood pressure better today but continue to hold Norvasc. 5. Acute kidney injury on chronic kidney disease stage II.  Likely secondary to relative hypotension 6. Chronic pain.  Continue oral pain medication    Code Status:     Code Status Orders  (From admission, onward)         Start     Ordered   12/30/19 2122  Full code  Continuous        12/30/19 2121        Code Status History    Date Active Date Inactive Code Status Order ID Comments User Context   02/18/2019 1542 02/20/2019 2014 Full Code [id number]  Ezekiel Slocumb, DO ED   06/05/2017 1753 06/08/2017 2016 Full Code [id number]  Bettey Costa, MD Inpatient   Advance Care Planning Activity    Advance Directive Documentation     Most Recent Value  Type of Advance Directive Living will, Healthcare Power of Attorney  Pre-existing out of facility DNR order (yellow form or pink MOST form) --  "  MOST" Form in Place? --     Disposition Plan: Status is: Inpatient  Dispo: The patient is from: Home               Anticipated d/c is to: Home              Anticipated d/c date is: Potential 01/04/2020 if tolerating diet and less pain              Patient currently being treated for acute colitis.  Colonoscopy biopsy still pending.  Still with quite a bit of abdominal pain and has not tolerated diet this morning.  Time spent: 27 minutes  [place]

## 2020-01-04 DIAGNOSIS — K625 Hemorrhage of anus and rectum: Secondary | ICD-10-CM | POA: Diagnosis not present

## 2020-01-04 DIAGNOSIS — K559 Vascular disorder of intestine, unspecified: Secondary | ICD-10-CM | POA: Diagnosis not present

## 2020-01-04 DIAGNOSIS — N179 Acute kidney failure, unspecified: Secondary | ICD-10-CM | POA: Diagnosis not present

## 2020-01-04 DIAGNOSIS — I959 Hypotension, unspecified: Secondary | ICD-10-CM | POA: Diagnosis not present

## 2020-01-04 LAB — BASIC METABOLIC PANEL
Anion gap: 10 (ref 5–15)
BUN: 5 mg/dL — ABNORMAL LOW (ref 8–23)
CO2: 26 mmol/L (ref 22–32)
Calcium: 8.9 mg/dL (ref 8.9–10.3)
Chloride: 107 mmol/L (ref 98–111)
Creatinine, Ser: 1 mg/dL (ref 0.44–1.00)
GFR, Estimated: >60 mL/min (ref >60)
Glucose, Bld: 86 mg/dL (ref 70–99)
Potassium: 3.3 mmol/L — ABNORMAL LOW (ref 3.5–5.1)
Sodium: 143 mmol/L (ref 135–145)

## 2020-01-04 LAB — SURGICAL PATHOLOGY

## 2020-01-04 LAB — HEMOGLOBIN: Hemoglobin: 10.4 g/dL — ABNORMAL LOW (ref 12.0–15.0)

## 2020-01-04 MED ORDER — POTASSIUM CHLORIDE CRYS ER 20 MEQ PO TBCR
40.0000 meq | EXTENDED_RELEASE_TABLET | Freq: Once | ORAL | Status: AC
  Administered 2020-01-04: 08:00:00 40 meq via ORAL
  Filled 2020-01-04: qty 2

## 2020-01-04 MED ORDER — LORATADINE 10 MG PO TABS
10.0000 mg | ORAL_TABLET | Freq: Every day | ORAL | 0 refills | Status: DC
Start: 2020-01-05 — End: 2020-01-04

## 2020-01-04 NOTE — Discharge Summary (Signed)
[facility] NAME: Rachael Jordan    MR#:  [id number]  DATE OF BIRTH:  [birth year]  DATE OF ADMISSION:  12/30/2019 ADMITTING PHYSICIAN: Toy Baker, MD  DATE OF DISCHARGE: 01/04/2020 10:53 AM  PRIMARY CARE PHYSICIAN: Sharyne Peach, MD    ADMISSION DIAGNOSIS:  Colitis [K52.9]  DISCHARGE DIAGNOSIS:  Active Problems:   Acute kidney injury superimposed on CKD (HCC)   Chronic pain syndrome   Essential hypertension   Acute colitis   Left lower quadrant abdominal pain   Rectal bleeding   SECONDARY DIAGNOSIS:   Past Medical History:  Diagnosis Date  . Acid reflux   . Anxiety   . Arthritis   . Bronchitis 09/2015  . Gross hematuria   . H. pylori infection   . Heart murmur   . Kidney failure   . 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 Lung abnormality    "damaged lung due to pneumonia"  . Neck pain   . Nephrolithiasis   . Pneumonia   . Pneumothorax   . Scoliosis   . Urinary frequency   . Vaginal atrophy     HOSPITAL COURSE:   1.  Acute left-sided colitis consistent with ischemic colitis on biopsy from colonoscopy.  Stool comprehensive panel and stool for C. difficile colitis were negative.  The patient had a lot of abdominal pain during the entire hospital course.  The patient was given empiric Zosyn during the course just in case this was infectious colitis.  This was discontinued once biopsy came back ischemic colitis.  Patient was able to tolerate diet and wanted to go home.  She has her chronic pain medications at home. 2.  Left-sided abdominal pain secondary to ischemic colitis.  As needed pain medications.  Benadryl as needed for itching.  I prescribed Claritin here but she does have Zyrtec at home. 3.  Rectal bleeding from ischemic colitis.  On aspirin as outpatient.  Hemoglobin did drift down a little bit with IV fluids but remained relatively stable during the hospital course. 4.  Relative  hypotension during the hospital course.  Blood pressure 149/85 upon disposition.  I held all her of her antihypertensive medications.  Can consider restarting at lower dose as outpatient and follow-up appointment of blood pressure is high. 5.  Acute kidney injury on chronic kidney disease stage II.  This was likely secondary to hypotension.  Creatinine was as high 1.07 and upon discharge 1.0. 6.  Chronic pain.  Continue her usual chronic pain medication  DISCHARGE CONDITIONS:   Satisfactory  CONSULTS OBTAINED:  Gastroenterology  DRUG ALLERGIES:   Allergies  Allergen Reactions  . Grapefruit Extract Rash  . Oxycodone Itching  . Propoxyphene Nausea And Vomiting and Nausea Only    DISCHARGE MEDICATIONS:   Allergies as of 01/04/2020      Reactions   Grapefruit Extract Rash   Oxycodone Itching   Propoxyphene Nausea And Vomiting, Nausea Only      Medication List    STOP taking these medications   amLODipine 5 MG tablet Commonly known as: NORVASC   olmesartan 40 MG tablet Commonly known as: BENICAR   oxycodone-acetaminophen 10-300 MG tablet Commonly known as: LYNOX   tiZANidine 4 MG tablet Commonly known as: ZANAFLEX   Trospium Chloride 60 MG Cp24   venlafaxine XR 75 MG 24 hr capsule Commonly known as: EFFEXOR-XR     TAKE these medications   albuterol (2.5 MG/3ML) 0.083% nebulizer solution Commonly known as: PROVENTIL  Take 2.5 mg by nebulization every 6 (six) hours as needed for wheezing or shortness of breath.   albuterol 108 (90 Base) MCG/ACT inhaler Commonly known as: VENTOLIN HFA Inhale 2 puffs into the lungs every 6 (six) hours as needed for wheezing or shortness of breath.   aspirin EC 81 MG tablet Take 81 mg by mouth daily.   azelastine 0.1 % nasal spray Commonly known as: ASTELIN Place 1-2 sprays into both nostrils 2 (two) times daily.   Biotin 10000 MCG Tabs Take 10 mg by mouth daily.   budesonide-formoterol 80-4.5 MCG/ACT inhaler Commonly known  as: SYMBICORT Inhale 2 puffs into the lungs 2 (two) times daily.   CENTRUM ADULTS PO Take 1 tablet by mouth daily.   cetirizine 10 MG tablet Commonly known as: ZYRTEC Take 10 mg by mouth at bedtime.   gabapentin 800 MG tablet Commonly known as: NEURONTIN Take 800 mg by mouth 3 (three) times daily.   montelukast 10 MG tablet Commonly known as: SINGULAIR Take 10 mg by mouth at bedtime.   morphine 15 MG tablet Commonly known as: MSIR Take 15 mg by mouth 2 (two) times daily as needed.   nortriptyline 10 MG capsule Commonly known as: PAMELOR Take 10 mg by mouth 2 (two) times daily.   pantoprazole 40 MG tablet Commonly known as: PROTONIX Take 40 mg by mouth daily.   pyridOXINE 100 MG tablet Commonly known as: VITAMIN B-6 Take 100 mg by mouth daily.   rizatriptan 10 MG tablet Commonly known as: MAXALT Take 10 mg by mouth as needed for migraine. May repeat in 2 hours if needed   traZODone 100 MG tablet Commonly known as: DESYREL Take 200 mg by mouth at bedtime.   valACYclovir 1000 MG tablet Commonly known as: VALTREX Take 1,000 mg by mouth daily.   vitamin C 500 MG tablet Commonly known as: ASCORBIC ACID Take 500 mg by mouth daily.   Vitamin D-1000 Max St 25 MCG (1000 UT) tablet Generic drug: Cholecalciferol Take 1,000 Units by mouth daily.   Vitamin E 45 MG Caps Take 90 mg by mouth daily.        DISCHARGE INSTRUCTIONS:   Follow-up PMD 5 days Follow-up gastroenterology 3 weeks  If you experience worsening of your admission symptoms, develop shortness of breath, life threatening emergency, suicidal or homicidal thoughts you must seek medical attention immediately by calling 911 or calling your MD immediately  if symptoms less severe.  You Must read complete instructions/literature along with all the possible adverse reactions/side effects for all the Medicines you take and that have been prescribed to you. Take any new Medicines after you have completely  understood and accept all the possible adverse reactions/side effects.   Please note  You were cared for by a hospitalist during your hospital stay. If you have any questions about your discharge medications or the care you received while you were in the hospital after you are discharged, you can call the unit and asked to speak with the hospitalist on call if the hospitalist that took care of you is not available. Once you are discharged, your primary care physician will handle any further medical issues. Please note that NO REFILLS for any discharge medications will be authorized once you are discharged, as it is imperative that you return to your primary care physician (or establish a relationship with a primary care physician if you do not have one) for your aftercare needs so that they can reassess your need for medications  and monitor your lab values.    Today   CHIEF COMPLAINT:   Chief Complaint  Patient presents with  . Abdominal Pain    HISTORY OF PRESENT ILLNESS:  Rachael Jordan  is a 64 y.o. female with a known history of came in with abdominal pain   VITAL SIGNS:  Blood pressure (!) 149/85, pulse 87, temperature 98.7 F (37.1 C), temperature source Oral, resp. rate 18, height 5' 5"  (1.651 m), weight 69.4 kg, SpO2 98 %.  I/O:    Intake/Output Summary (Last 24 hours) at 01/04/2020 1701 Last data filed at 01/04/2020 0900 Gross per 24 hour  Intake 527.92 ml  Output --  Net 527.92 ml    PHYSICAL EXAMINATION:  GENERAL:  64 y.o.-year-old patient lying in the bed with no acute distress.  EYES: Pupils equal, round, reactive to light and accommodation. No scleral icterus. Extraocular muscles intact.  HEENT: Head atraumatic, normocephalic. Oropharynx and nasopharynx clear.  LUNGS: Normal breath sounds bilaterally, no wheezing, rales,rhonchi or crepitation. No use of accessory muscles of respiration.  CARDIOVASCULAR: S1, S2 normal. No murmurs, rubs, or gallops.  ABDOMEN:  Soft, slight left lower abdominal tenderness.  EXTREMITIES: No pedal edema.  NEUROLOGIC: Cranial nerves II through XII are intact. Muscle strength 5/5 in all extremities. Sensation intact. Gait not checked.  PSYCHIATRIC: The patient is alert and oriented x 3.  SKIN: No obvious rash, lesion, or ulcer.   DATA REVIEW:   CBC Recent Labs  Lab 01/02/20 0611 01/03/20 0304 01/04/20 0424  WBC 7.9  --   --   HGB 10.6*   < > 10.4*  HCT 32.9*  --   --   PLT 319  --   --    < > = values in this interval not displayed.    Chemistries  Recent Labs  Lab 12/31/19 0453 01/01/20 0459 01/04/20 0424  NA 138   < > 143  K 3.9   < > 3.3*  CL 104   < > 107  CO2 27   < > 26  GLUCOSE 104*   < > 86  BUN 9   < > 5*  CREATININE 1.01*   < > 1.00  CALCIUM 8.7*   < > 8.9  MG 2.1  --   --   AST 19  --   --   ALT 14  --   --   ALKPHOS 54  --   --   BILITOT 0.7  --   --    < > = values in this interval not displayed.     Microbiology Results  Results for orders placed or performed during the hospital encounter of 12/30/19  Respiratory Panel by RT PCR (Flu A&B, Covid) - Nasopharyngeal Swab     Status: None   Collection Time: 12/30/19  7:30 PM   Specimen: Nasopharyngeal Swab  Result Value Ref Range Status   SARS Coronavirus 2 by RT PCR NEGATIVE NEGATIVE Final    Comment: (NOTE) SARS-CoV-2 target nucleic acids are NOT DETECTED.  The SARS-CoV-2 RNA is generally detectable in upper respiratoy specimens during the acute phase of infection. The lowest concentration of SARS-CoV-2 viral copies this assay can detect is 131 copies/mL. A negative result does not preclude SARS-Cov-2 infection and should not be used as the sole basis for treatment or other patient management decisions. A negative result may occur with  improper specimen collection/handling, submission of specimen other than nasopharyngeal swab, presence of viral mutation(s) within the areas targeted by  this assay, and inadequate number  of viral copies (<131 copies/mL). A negative result must be combined with clinical observations, patient history, and epidemiological information. The expected result is Negative.  Fact Sheet for Patients:  PinkCheek.be  Fact Sheet for Healthcare Providers:  GravelBags.it  This test is no t yet approved or cleared by the [place] FDA and  has been authorized for detection and/or diagnosis of SARS-CoV-2 by FDA under an Emergency Use Authorization (EUA). This EUA will remain  in effect (meaning this test can be used) for the duration of the COVID-19 declaration under Section 564(b)(1) of the Act, 21 U.S.C. section 360bbb-3(b)(1), unless the authorization is terminated or revoked sooner.     Influenza A by PCR NEGATIVE NEGATIVE Final   Influenza B by PCR NEGATIVE NEGATIVE Final    Comment: (NOTE) The Xpert Xpress SARS-CoV-2/FLU/RSV assay is intended as an aid in  the diagnosis of influenza from Nasopharyngeal swab specimens and  should not be used as a sole basis for treatment. Nasal washings and  aspirates are unacceptable for Xpert Xpress SARS-CoV-2/FLU/RSV  testing.  Fact Sheet for Patients: PinkCheek.be  Fact Sheet for Healthcare Providers: GravelBags.it  This test is not yet approved or cleared by the [place] FDA and  has been authorized for detection and/or diagnosis of SARS-CoV-2 by  FDA under an Emergency Use Authorization (EUA). This EUA will remain  in effect (meaning this test can be used) for the duration of the  Covid-19 declaration under Section 564(b)(1) of the Act, 21  U.S.C. section 360bbb-3(b)(1), unless the authorization is  terminated or revoked. Performed at [facility], [place], [place] [address]   C Difficile Quick Screen w PCR reflex     Status: None   Collection Time: 12/30/19  9:13 PM   Specimen:  STOOL  Result Value Ref Range Status   C Diff antigen NEGATIVE NEGATIVE Final   C Diff toxin NEGATIVE NEGATIVE Final   C Diff interpretation No C. difficile detected.  Final    Comment: Performed at [facility], [address] [place], [place] [address]  Gastrointestinal Panel by PCR , Stool     Status: None   Collection Time: 12/30/19  9:13 PM   Specimen: STOOL  Result Value Ref Range Status   Campylobacter species NOT DETECTED NOT DETECTED Final   Plesimonas shigelloides NOT DETECTED NOT DETECTED Final   Salmonella species NOT DETECTED NOT DETECTED Final   Yersinia enterocolitica NOT DETECTED NOT DETECTED Final   Vibrio species NOT DETECTED NOT DETECTED Final   Vibrio cholerae NOT DETECTED NOT DETECTED Final   Enteroaggregative E coli (EAEC) NOT DETECTED NOT DETECTED Final   Enteropathogenic E coli (EPEC) NOT DETECTED NOT DETECTED Final   Enterotoxigenic E coli (ETEC) NOT DETECTED NOT DETECTED Final   Shiga like toxin producing E coli (STEC) NOT DETECTED NOT DETECTED Final   Shigella/Enteroinvasive E coli (EIEC) NOT DETECTED NOT DETECTED Final   Cryptosporidium NOT DETECTED NOT DETECTED Final   Cyclospora cayetanensis NOT DETECTED NOT DETECTED Final   Entamoeba histolytica NOT DETECTED NOT DETECTED Final   Giardia lamblia NOT DETECTED NOT DETECTED Final   Adenovirus F40/41 NOT DETECTED NOT DETECTED Final   Astrovirus NOT DETECTED NOT DETECTED Final   Norovirus GI/GII NOT DETECTED NOT DETECTED Final   Rotavirus A NOT DETECTED NOT DETECTED Final   Sapovirus (I, II, IV, and V) NOT DETECTED NOT DETECTED Final    Comment: Performed at [facility], [address] [place], [place] [address]     Management plans discussed with the patient, family and they are in agreement.  CODE STATUS:  Code Status History    Date Active Date Inactive Code Status Order ID Comments User Context   12/30/2019 2122 01/04/2020 1642 Full Code [id number]  Toy Baker, MD ED    02/18/2019 1542 02/20/2019 2014 Full Code [id number]  Ezekiel Slocumb, DO ED   06/05/2017 1753 06/08/2017 2016 Full Code [id number]  Bettey Costa, MD Inpatient   Advance Care Planning Activity    Questions for Most Recent Historical Code Status (Order [id number])         Advance Directive Documentation     Most Recent Value  Type of Advance Directive Living will, Healthcare Power of Attorney  Pre-existing out of facility DNR order (yellow form or pink MOST form) --  "MOST" Form in Place? --      TOTAL TIME TAKING CARE OF THIS PATIENT: 35 minutes.    Loletha Grayer M.D on 01/04/2020 at 5:01 PM  Between 7am to 6pm - Pager - [phone]  After 6pm go to [email or web address] - password [email or web address]  [facility]  CC: Primary care physician; Sharyne Peach, MD

## 2020-02-08 ENCOUNTER — Telehealth: Payer: Self-pay

## 2020-02-08 NOTE — Telephone Encounter (Signed)
Opened in error

## 2020-02-21 ENCOUNTER — Other Ambulatory Visit: Payer: Self-pay | Admitting: Adult Health

## 2020-02-21 DIAGNOSIS — R0602 Shortness of breath: Secondary | ICD-10-CM

## 2020-03-06 ENCOUNTER — Telehealth: Payer: Self-pay

## 2020-03-06 NOTE — Telephone Encounter (Signed)
Faxed medical record payment request to [phone].

## 2020-03-06 NOTE — Telephone Encounter (Signed)
Completed medical record request and mailed requesting records to [place] [id number].

## 2020-04-01 ENCOUNTER — Other Ambulatory Visit: Payer: 59

## 2020-04-18 ENCOUNTER — Other Ambulatory Visit: Payer: Self-pay | Admitting: Family Medicine

## 2020-04-18 DIAGNOSIS — Z1231 Encounter for screening mammogram for malignant neoplasm of breast: Secondary | ICD-10-CM

## 2020-05-03 ENCOUNTER — Ambulatory Visit: Payer: 59 | Admitting: Hospice and Palliative Medicine

## 2020-05-10 ENCOUNTER — Ambulatory Visit
Admission: RE | Admit: 2020-05-10 | Discharge: 2020-05-10 | Disposition: A | Discharge status: Home / Self Care | Payer: 59 | Source: Ambulatory Visit | Attending: Family Medicine | Admitting: Family Medicine

## 2020-05-10 ENCOUNTER — Other Ambulatory Visit: Payer: Self-pay

## 2020-05-10 DIAGNOSIS — Z1231 Encounter for screening mammogram for malignant neoplasm of breast: Secondary | ICD-10-CM | POA: Diagnosis present

## 2020-05-23 ENCOUNTER — Ambulatory Visit (INDEPENDENT_AMBULATORY_CARE_PROVIDER_SITE_OTHER): Payer: 59

## 2020-05-23 ENCOUNTER — Ambulatory Visit (INDEPENDENT_AMBULATORY_CARE_PROVIDER_SITE_OTHER): Payer: 59 | Admitting: Podiatry

## 2020-05-23 ENCOUNTER — Other Ambulatory Visit: Payer: Self-pay

## 2020-05-23 DIAGNOSIS — M7741 Metatarsalgia, right foot: Secondary | ICD-10-CM | POA: Diagnosis not present

## 2020-05-23 DIAGNOSIS — M779 Enthesopathy, unspecified: Secondary | ICD-10-CM | POA: Diagnosis not present

## 2020-05-23 MED ORDER — METHYLPREDNISOLONE 4 MG PO TBPK: ORAL_TABLET | ORAL | 0 refills | Status: DC

## 2020-05-24 ENCOUNTER — Other Ambulatory Visit: Payer: Self-pay | Admitting: Unknown Physician Specialty

## 2020-05-24 ENCOUNTER — Ambulatory Visit
Admission: RE | Admit: 2020-05-24 | Discharge: 2020-05-24 | Disposition: A | Discharge status: Home / Self Care | Payer: Self-pay | Source: Ambulatory Visit | Attending: Unknown Physician Specialty | Admitting: Unknown Physician Specialty

## 2020-05-24 ENCOUNTER — Telehealth: Payer: Self-pay | Admitting: *Deleted

## 2020-05-24 DIAGNOSIS — E041 Nontoxic single thyroid nodule: Secondary | ICD-10-CM

## 2020-05-24 NOTE — Telephone Encounter (Signed)
Patient is needing an upcoming appointment for an injection per DR Jacqualyn Posey. Please call.  Mailed fleet coupon to patient, called and informed thru [place].

## 2020-05-24 NOTE — Telephone Encounter (Signed)
Yes, we can schedule her for an injection. She didn't tell me she was on steroids previously and I didn't know she saw someone else for her foot.   It was a Scientist, research (life sciences) coupon if you could mail it.  Thanks!

## 2020-05-24 NOTE — Telephone Encounter (Signed)
Patient is wanting to let doctor know that the medication prescribed (methlPrednisolone) 1 day ago was recently given by another physician 1 wk ago and it did not help her foot. Should she schedule for an injection? Please advise. She is also asking about  a coupon was suppose to be given to her at end of visit, did no receive.Could you mail to her home address?

## 2020-05-28 NOTE — Progress Notes (Signed)
Subjective: 65 year old female presents the office today for concerns of bilateral foot pain of the right side worse than the left.  She points on the ball the foot where she gets discomfort mostly in the second MPJ area on the right foot.  She denies any recent injury or trauma.  She states that she likes to wear "cute" shoes and is not ready to give this up.  She said no recent treatment.  She has no other concerns today. Denies any systemic complaints such as fevers, chills, nausea, vomiting. No acute changes since last appointment, and no other complaints at this time.   Objective: AAO x3, NAD DP/PT pulses palpable bilaterally, CRT less than 3 seconds Previous bunionectomy on the right side.  There is no pain or crepitation with PT range of motion.  No significant restriction in joint range of motion.  The majority tenderness on second MPJ with some mild edema present there is no area of pinpoint tenderness.  There is prominence of metatarsal heads plantarly with atrophy of the fat pad.  She has similar symptoms on the left side but not as symptomatic as the right.  No open lesions.  MMT 5/5. No pain with calf compression, swelling, warmth, erythema  Assessment: 65 year old female with capsulitis right foot > left  Plan: -All treatment options discussed with the patient including all alternatives, risks, complications.  -X-rays obtained reviewed.  Evidence of acute fracture.  Arthritic changes present the first MPJ -I discussed steroid injection which is hesitant to do this.  I prescribed a Medrol Dosepak.  I discussed with her changing shoes in moderation.  Discussed when she is active in her feet she is to wear her shoe with better support.  She presents today wearing very flat shoes.  Discussed we can also make an orthotic for her.  Dispensed metatarsal offloading pads.  We will get her measured for orthotics.  We will try a dress orthotic with metatarsal pad. -Patient encouraged to call the  office with any questions, concerns, change in symptoms.

## 2020-05-30 ENCOUNTER — Other Ambulatory Visit: Payer: Self-pay

## 2020-05-30 ENCOUNTER — Ambulatory Visit (INDEPENDENT_AMBULATORY_CARE_PROVIDER_SITE_OTHER): Payer: 59 | Admitting: Podiatry

## 2020-05-30 DIAGNOSIS — G5782 Other specified mononeuropathies of left lower limb: Secondary | ICD-10-CM | POA: Diagnosis not present

## 2020-05-30 DIAGNOSIS — M21611 Bunion of right foot: Secondary | ICD-10-CM

## 2020-05-30 MED ORDER — DEXAMETHASONE SODIUM PHOSPHATE 120 MG/30ML IJ SOLN
2.0000 mg | Freq: Once | INTRAMUSCULAR | Status: AC
  Administered 2020-05-30: 2 mg via INTRA_ARTICULAR

## 2020-05-30 NOTE — Progress Notes (Signed)
  Subjective:  [name], female    DOB: [birth year],  MRN: [id number]  Chief Complaint  Patient presents with  . Capsulitis    Pt. Is here for left foot injection.     65 y.o. female presents with the above complaint. History confirmed with patient.  He said the steroid pack did not help.  She just came off of one previously for a lung infection.  Requesting injection to the left foot today she is having pain between the left second and third toes.  States that the area is a stinging burning feeling.  Also complains right foot bunion starting to hurt her that the worse and she would like to maybe consider surgery.  Objective:  Physical Exam: warm, good capillary refill, no trophic changes or ulcerative lesions, normal DP and PT pulses and normal sensory exam. Left Foot: POP 2nd interspace. Well healed incisions 1st/2nd MPJ. Mulder's click noted. Slight prominent 2nd met head. Right Foot: HAV right with POP, 2nd toe hammertoe with dorsal subluxation. Prominent 2nd met head  Assessment:   1. Interdigital neuroma of left foot   2. Bunion, right foot      Plan:  Patient was evaluated and treated and all questions answered.  Interdigital Neuroma -Injection delivered to the affected interspaces  Procedure: Neuroma Injection Location: Left 2nd interspace Skin Prep: Alcohol. Injectate: 0.5 cc 0.5% marcaine plain, 0.5 cc betamethasone acetate-betamethasone sodium phosphate Disposition: Patient tolerated procedure well. Injection site dressed with a band-aid.  HAV Right -Discussed should pain persist could consider injection or surgical intervention.  Will refer her back to Dr. Jacqualyn Posey her original surgeon for further evaluation.  Return in about 1 month (around 06/30/2020) for bunion eval.

## 2020-05-30 NOTE — Telephone Encounter (Signed)
Called patient twice, number is currently disconnected

## 2020-05-31 ENCOUNTER — Ambulatory Visit
Admission: RE | Admit: 2020-05-31 | Discharge: 2020-05-31 | Disposition: A | Discharge status: Home / Self Care | Payer: 59 | Source: Ambulatory Visit | Attending: Unknown Physician Specialty | Admitting: Unknown Physician Specialty

## 2020-05-31 DIAGNOSIS — E041 Nontoxic single thyroid nodule: Secondary | ICD-10-CM | POA: Diagnosis not present

## 2020-05-31 NOTE — Discharge Instructions (Signed)
Post Operative Instructions for Thyroid Biopsy  1. Keep pressure bandage over site of biopsy for 3-4 hours after leaving office.  2. Normal activity is permitted as long as it does not involve anything strenuous (i.e., jogging, heavy lifting, etc.).  3. Some minor pain may be experienced when the local anesthesia wears off, and should be relieved with Tylenol, Advil, or ibuprofen.  4. You may experience some bruising and/or swelling at the site of the biopsy.  5. If you should develop severe pain, difficulty swallowing or difficulty breathing, please call the office or [facility] at [phone].  6. Apply ice pack for 20 minutes this evening.

## 2020-06-01 LAB — CYTOLOGY - NON PAP

## 2020-06-05 ENCOUNTER — Telehealth: Payer: Self-pay | Admitting: Podiatry

## 2020-06-05 NOTE — Telephone Encounter (Signed)
Patient calling to inform Dr. March Rummage that the injection she was given 3/15 did not help with pain. Is requesting next options.

## 2020-06-05 NOTE — Telephone Encounter (Signed)
Please schedule her back with me to discuss. Thanks.

## 2020-06-05 NOTE — Telephone Encounter (Signed)
Pt states she received an injection on 3/15 and it isn't working. She states she still feels a significant amount of pain and would like to know next steps. Please advise.

## 2020-06-09 ENCOUNTER — Other Ambulatory Visit: Payer: Self-pay | Admitting: Unknown Physician Specialty

## 2020-06-09 DIAGNOSIS — E041 Nontoxic single thyroid nodule: Secondary | ICD-10-CM

## 2020-06-13 ENCOUNTER — Ambulatory Visit (INDEPENDENT_AMBULATORY_CARE_PROVIDER_SITE_OTHER): Payer: 59 | Admitting: Podiatry

## 2020-06-13 ENCOUNTER — Other Ambulatory Visit: Payer: Self-pay

## 2020-06-13 DIAGNOSIS — G5782 Other specified mononeuropathies of left lower limb: Secondary | ICD-10-CM | POA: Diagnosis not present

## 2020-06-13 DIAGNOSIS — M779 Enthesopathy, unspecified: Secondary | ICD-10-CM

## 2020-06-13 DIAGNOSIS — M21611 Bunion of right foot: Secondary | ICD-10-CM | POA: Diagnosis not present

## 2020-06-13 MED ORDER — METHYLPREDNISOLONE 4 MG PO TBPK: ORAL_TABLET | ORAL | 0 refills | Status: DC

## 2020-06-13 NOTE — Progress Notes (Signed)
  Subjective:  [name], female    DOB: 08-06-1955,  MRN: [id number]  Chief Complaint  Patient presents with  . Foot Orthotics     bilateral foot pain-previous injection /puo(orthotics in)    65 y.o. female presents with the above complaint. States the injection only helped for a short period. Her foot pain is worse. Having pain in both feet that is preventing her from wearing her pumps. She presents wearing sneakers today.  Objective:  Physical Exam: warm, good capillary refill, no trophic changes or ulcerative lesions, normal DP and PT pulses and normal sensory exam. Left Foot: POP 2nd interspace. Well healed incisions 1st/2nd MPJ. Mulder's click noted. Slight prominent 2nd met head. Right Foot: HAV right with POP, 2nd toe hammertoe with dorsal subluxation. Prominent 2nd met head  Assessment:   1. Interdigital neuroma of left foot   2. Bunion, right foot   3. Capsulitis    Plan:  Patient was evaluated and treated and all questions answered.  Interdigital Neuroma -Hold off repeat injection today. Trial Medrol pack to calm down inflammation. Transition into her custom molded orthotics. These were dispensed today. Verbal and written instructions on how to break into these were dispensed.  HAV Right -Pending eval by Dr. Jacqualyn Posey her original surgeon for further evaluation.  Return in about 2 weeks (around 06/27/2020).

## 2020-06-27 ENCOUNTER — Other Ambulatory Visit: Payer: Self-pay

## 2020-06-27 ENCOUNTER — Ambulatory Visit (INDEPENDENT_AMBULATORY_CARE_PROVIDER_SITE_OTHER): Payer: 59 | Admitting: Podiatry

## 2020-06-27 DIAGNOSIS — G5782 Other specified mononeuropathies of left lower limb: Secondary | ICD-10-CM | POA: Diagnosis not present

## 2020-06-29 ENCOUNTER — Ambulatory Visit
Admission: RE | Admit: 2020-06-29 | Discharge: 2020-06-29 | Disposition: A | Discharge status: Home / Self Care | Payer: 59 | Source: Ambulatory Visit | Attending: Unknown Physician Specialty | Admitting: Unknown Physician Specialty

## 2020-06-29 ENCOUNTER — Other Ambulatory Visit (HOSPITAL_COMMUNITY): Payer: Self-pay | Admitting: Physician Assistant

## 2020-06-29 ENCOUNTER — Other Ambulatory Visit: Payer: Self-pay

## 2020-06-29 DIAGNOSIS — E041 Nontoxic single thyroid nodule: Secondary | ICD-10-CM | POA: Insufficient documentation

## 2020-06-29 HISTORY — DX: Cerebral infarction, unspecified: I63.9

## 2020-06-29 HISTORY — DX: Other seasonal allergic rhinitis: J30.2

## 2020-06-29 MED ORDER — FENTANYL CITRATE (PF) 100 MCG/2ML IJ SOLN
INTRAMUSCULAR | Status: AC | PRN
  Administered 2020-06-29: 50 ug via INTRAVENOUS

## 2020-06-29 MED ORDER — SODIUM CHLORIDE 0.9 % IV SOLN: INTRAVENOUS | Status: DC

## 2020-06-29 MED ORDER — MIDAZOLAM HCL 2 MG/2ML IJ SOLN
INTRAMUSCULAR | Status: AC
  Filled 2020-06-29: qty 2

## 2020-06-29 MED ORDER — MIDAZOLAM HCL 2 MG/2ML IJ SOLN
INTRAMUSCULAR | Status: AC | PRN
  Administered 2020-06-29: 1 mg via INTRAVENOUS

## 2020-06-29 MED ORDER — FENTANYL CITRATE (PF) 100 MCG/2ML IJ SOLN
INTRAMUSCULAR | Status: AC
  Filled 2020-06-29: qty 2

## 2020-06-29 NOTE — Consult Note (Signed)
Chief Complaint: Thyroid Nodule  Referring Physician(s): McQueen,Chapman  Patient Status: [facility] - Out-pt  History of Present Illness: Rachael Jordan is a 65 y.o. female with suspicious thyroid isthmus nodule presents to IR for repeat biopsy.  Prior FNA was inconclusive.  She is anxious about the biopsy but otherwise well.  She denies neck, chest, and abdominal pain.  She has no difficulty swallowing, SOB, or cough.  Past Medical History:  Diagnosis Date  . Acid reflux   . Anxiety   . Arthritis   . Bronchitis 09/2015  . CVA (cerebral vascular accident) ([place])   . Gross hematuria   . H. pylori infection   . Heart murmur   . Kidney failure   . 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 Lung abnormality    "damaged lung due to pneumonia"  . Neck pain   . Nephrolithiasis   . Pneumonia   . Pneumothorax   . Scoliosis   . Seasonal allergies   . Urinary frequency   . Vaginal atrophy     Past Surgical History:  Procedure Laterality Date  . ABDOMINAL HYSTERECTOMY     vaginal  . APPENDECTOMY    . BREAST BIOPSY    . COLONOSCOPY N/A 01/02/2020   Procedure: COLONOSCOPY;  Surgeon: Toledo, Benay Pike, MD;  Location: [facility] ENDOSCOPY;  Service: Gastroenterology;  Laterality: N/A;  . COLONOSCOPY WITH PROPOFOL N/A 12/29/2018   Procedure: COLONOSCOPY WITH PROPOFOL;  Surgeon: Lucilla Lame, MD;  [facility] ENDOSCOPY;  Service: Endoscopy;  Laterality: N/A;  . ESOPHAGOGASTRODUODENOSCOPY N/A 01/02/2020   Procedure: ESOPHAGOGASTRODUODENOSCOPY (EGD);  Surgeon: Toledo, Benay Pike, MD;  Location: [facility] ENDOSCOPY;  Service: Gastroenterology;  Laterality: N/A;  . FOOT SURGERY Left    10/2015  . NECK SURGERY      Allergies: Grapefruit extract, Oxycodone, and Propoxyphene  Medications: Prior to Admission medications   Medication Sig Start Date End Date Taking? Authorizing Provider  amLODipine (NORVASC) 5 MG tablet Take 5 mg by mouth daily. 04/28/20  Yes [provider]   azelastine (ASTELIN) 0.1 % nasal spray Place 1-2 sprays into both nostrils 2 (two) times daily.    Yes [provider]  Biotin 10000 MCG TABS Take 10 mg by mouth daily.    Yes [provider]  budesonide-formoterol (SYMBICORT) 80-4.5 MCG/ACT inhaler Inhale 2 puffs into the lungs 2 (two) times daily. 10/26/19  Yes Scarboro, Audie Clear, NP  cetirizine (ZYRTEC) 10 MG tablet Take 10 mg by mouth at bedtime.    Yes [provider]  Cholecalciferol 25 MCG (1000 UT) tablet Take 1,000 Units by mouth daily.    Yes [provider]  gabapentin (NEURONTIN) 800 MG tablet Take 800 mg by mouth 3 (three) times daily.  12/03/19  Yes [provider]  loratadine (CLARITIN) 10 MG tablet Take 10 mg by mouth daily. 01/17/20  Yes [provider]  montelukast (SINGULAIR) 10 MG tablet Take 10 mg by mouth in the morning.   Yes [provider]  Multiple Vitamins-Minerals (CENTRUM ADULTS PO) Take 1 tablet by mouth daily.    Yes [provider]  nortriptyline (PAMELOR) 10 MG capsule Take 10 mg by mouth 2 (two) times daily. 12/29/19  Yes [provider]  olmesartan (BENICAR) 40 MG tablet Take 40 mg by mouth daily. 04/28/20  Yes [provider]  pantoprazole (PROTONIX) 40 MG tablet Take 40 mg by mouth daily.   Yes [provider]  rizatriptan (MAXALT) 10 MG tablet Take 10 mg by mouth as needed  for migraine. May repeat in 2 hours if needed   Yes [provider]  tiZANidine (ZANAFLEX) 4 MG tablet Take 4 mg by mouth 3 (three) times daily. 04/28/20  Yes [provider]  traZODone (DESYREL) 100 MG tablet Take 200 mg by mouth at bedtime.    Yes [provider]  vitamin C (ASCORBIC ACID) 500 MG tablet Take 500 mg by mouth daily.   Yes [provider]  Vitamin E 45 MG CAPS Take 90 mg by mouth daily.   Yes [provider]  albuterol (PROVENTIL) (2.5 MG/3ML) 0.083% nebulizer solution Take 2.5 mg by  nebulization every 6 (six) hours as needed for wheezing or shortness of breath.    [provider]  albuterol (VENTOLIN HFA) 108 (90 Base) MCG/ACT inhaler Inhale 2 puffs into the lungs every 6 (six) hours as needed for wheezing or shortness of breath. 10/26/19   Kendell Bane, NP  aspirin EC 81 MG tablet Take 81 mg by mouth daily.    [provider]  methylPREDNISolone (MEDROL DOSEPAK) 4 MG TBPK tablet Take as directed Patient not taking: Reported on 06/29/2020 05/23/20   Trula Slade, DPM  methylPREDNISolone (MEDROL DOSEPAK) 4 MG TBPK tablet 6 Day Taper Pack. Take as Directed. Patient not taking: Reported on 06/29/2020 06/13/20   Evelina Bucy, DPM  morphine (MSIR) 15 MG tablet Take 15 mg by mouth 2 (two) times daily as needed. Patient not taking: Reported on 06/29/2020 12/29/19   [provider]  pyridOXINE (VITAMIN B-6) 100 MG tablet Take 100 mg by mouth daily. Patient not taking: Reported on 06/29/2020    [provider]  trospium (SANCTURA) 20 MG tablet Take 1 tablet by mouth 2 (two) times daily. Patient not taking: Reported on 06/29/2020 08/28/18   [provider]  valACYclovir (VALTREX) 1000 MG tablet Take 1,000 mg by mouth daily as needed.    [provider]     Family History  Problem Relation Age of Onset  . Arthritis Mother   . Asthma Mother   . Hyperlipidemia Mother   . Hypertension Mother   . Diabetes Sister   . Cancer Maternal Aunt        esophagus  . Breast cancer Neg Hx   . Kidney cancer Neg Hx   . Bladder Cancer Neg Hx     Social History   Socioeconomic History  . Marital status: Legally Separated    Spouse name: Not on file  . Number of children: Not on file  . Years of education: Not on file  . Highest education level: Not on file  Occupational History  . Not on file  Tobacco Use  . Smoking status: Never Smoker  . Smokeless tobacco: Never Used  Vaping Use  . Vaping Use: Never used  Substance and  Sexual Activity  . Alcohol use: No    Alcohol/week: 0.0 standard drinks  . Drug use: No  . Sexual activity: Not on file  Other Topics Concern  . Not on file  Social History Narrative  . Not on file   Social Determinants of Health   Financial Resource Strain: Not on file  Food Insecurity: Not on file  Transportation Needs: Not on file  Physical Activity: Not on file  Stress: Not on file  Social Connections: Not on file   Review of Systems: A 12 point ROS discussed and pertinent positives are indicated in the HPI above.  All other systems are negative.  Review of  Systems  Vital Signs: BP 137/80   Pulse 92   Temp 98.1 F (36.7 C) (Oral)   Resp 16   Ht 5' 5"  (1.651 m)   Wt 69.9 kg   SpO2 100%   BMI 25.63 kg/m   Physical Exam Constitutional:      Appearance: Normal appearance.  HENT:     Mouth/Throat:     Mouth: Mucous membranes are moist.     Pharynx: Oropharynx is clear.  Cardiovascular:     Rate and Rhythm: Normal rate and regular rhythm.  Pulmonary:     Effort: Pulmonary effort is normal.     Breath sounds: Normal breath sounds.  Abdominal:     Palpations: Abdomen is soft.  Skin:    General: Skin is warm and dry.  Neurological:     Mental Status: She is alert and oriented to person, place, and time.  Psychiatric:        Mood and Affect: Mood normal.     Imaging: [place] FNA BX THYROID 1ST LESION AFIRMA  Result Date: 05/31/2020 INDICATION: Indeterminate thyroid nodule EXAM: ULTRASOUND GUIDED FINE NEEDLE ASPIRATION OF INDETERMINATE THYROID NODULE COMPARISON:  Outside facility thyroid ultrasound from 05/24/2020 MEDICATIONS: None COMPLICATIONS: None immediate. TECHNIQUE: Informed written consent was obtained from the patient after a discussion of the risks, benefits and alternatives to treatment. Questions regarding the procedure were encouraged and answered. A timeout was performed prior to the initiation of the procedure. Pre-procedural ultrasound scanning  demonstrated unchanged size and appearance of the indeterminate nodule within the right-sided isthmus. The procedure was planned. The neck was prepped in the usual sterile fashion, and a sterile drape was applied covering the operative field. A timeout was performed prior to the initiation of the procedure. Local anesthesia was provided with 1% lidocaine. Under direct ultrasound guidance, 6 FNA biopsies were performed of the right-sided isthmus nodule with a 25 gauge needle. Multiple ultrasound images were saved for procedural documentation purposes. The samples were prepared and submitted to pathology. Limited post procedural scanning was negative for hematoma or additional complication. Dressings were placed. The patient tolerated the above procedures procedure well without immediate postprocedural complication. FINDINGS: Nodule reference number based on prior diagnostic ultrasound: 1 Maximum size: 1.6 cm Location: Isthmus; Mid/right ACR TI-RADS risk category: TR4 (4-6 points) Reason for biopsy: meets ACR TI-RADS criteria Ultrasound imaging confirms appropriate placement of the needles within the thyroid nodule. IMPRESSION: Technically successful ultrasound guided fine needle aspiration of right-sided isthmus nodule. Ruthann Cancer, MD [facility] Radiology Electronically Signed   By: Ruthann Cancer MD   On: 05/31/2020 14:28    Labs:  CBC: Recent Labs    12/31/19 1019 12/31/19 1517 12/31/19 2017 01/01/20 1154 01/02/20 0611 01/03/20 0304 01/04/20 0424  WBC 16.1* 17.4* 15.2*  --  7.9  --   --   HGB 11.2* 11.8* 11.2* 10.5* 10.6* 9.2* 10.4*  HCT 34.4* 36.1 34.5*  --  32.9*  --   --   PLT 378 337 339  --  319  --   --     COAGS: No results for input(s): INR, APTT in the last 8760 hours.  BMP: Recent Labs    12/31/19 0453 01/01/20 0459 01/02/20 0611 01/04/20 0424  NA 138 139 142 143  K 3.9 3.9 4.0 3.3*  CL 104 106 108 107  CO2 27 25 25 26    GLUCOSE 104* 83 92 86  BUN 9 7* <5* 5*  CALCIUM 8.7* 8.3* 8.8* 8.9  CREATININE 1.01* 1.07* 1.02* 1.00  GFRNONAA 60* 56* 59* >60    LIVER FUNCTION TESTS: Recent Labs    12/30/19 1217 12/31/19 0453  BILITOT 0.7 0.7  AST 24 19  ALT 18 14  ALKPHOS 59 54  PROT 8.1 6.9  ALBUMIN 4.3 3.7    TUMOR MARKERS: No results for input(s): AFPTM, CEA, CA199, CHROMGRNA in the last 8760 hours.  Assessment and Plan:  65 year old woman with suspicious Thyroid Isthmus nodule presents to IR for US guided FNA. Risks and benefits of thyroid nodule FNA was discussed with the patient and/or patient's family including, but not limited to bleeding, infection, damage to adjacent structures or low yield requiring additional tests.  All of the questions were answered and there is agreement to proceed.  Consent signed and in chart.    Thank you for this interesting consult.  I greatly enjoyed meeting Rachael Jordan and look forward to participating in their care.  A copy of this report was sent to the requesting provider on this date.  Electronically Signed: Paula Libra Sallyann Kinnaird, MD 06/29/2020, 1:53 PM   I spent a total of  30 Minutes in face to face in clinical consultation, greater than 50% of which was counseling/coordinating care for thyroid nodule FNA.

## 2020-06-29 NOTE — Progress Notes (Signed)
Patient clinically stable post Thyroid biopsy per Dr Dwaine Gale, tolerated well. Received Versed 2 mg along with Fentanyl 100 mcg IV for procedure per patient request. Awake/alert/oriented post procedure. Report given to [facility] Rn in specials post procedure.family member taken to bedside with update given.

## 2020-06-29 NOTE — Procedures (Signed)
Interventional Radiology Procedure Note  Procedure: Thyroid nodule FNA  Indication: Suspicious Thyroid Nodule  Findings: Please refer to procedural dictation for full description.  Complications: None  EBL: < 10 mL  Miachel Roux, MD [phone]

## 2020-06-30 LAB — CYTOLOGY - NON PAP

## 2020-07-06 ENCOUNTER — Other Ambulatory Visit: Payer: Self-pay | Admitting: Unknown Physician Specialty

## 2020-07-06 DIAGNOSIS — E041 Nontoxic single thyroid nodule: Secondary | ICD-10-CM

## 2020-07-10 ENCOUNTER — Telehealth: Payer: Self-pay | Admitting: Podiatry

## 2020-07-10 NOTE — Telephone Encounter (Signed)
Patient saw Dr. March Rummage on 06/27/2020. And she was suppose to have a MRI referral sent to  and she has not heard anything. She was calling to follow up. Please advise

## 2020-07-10 NOTE — Progress Notes (Signed)
  Subjective:  [name], female    DOB: 03/21/1955,  MRN: [id number]  Chief Complaint  Patient presents with  . Neuroma    Left foot neuroma. Pt states no change.     65 y.o. female presents with the above complaint. Still having a lot of pain in her foot and the injections and other medications did not help. She is not able to comfortably wear her shoes.  Objective:  Physical Exam: warm, good capillary refill, no trophic changes or ulcerative lesions, normal DP and PT pulses and normal sensory exam. Left Foot: POP 2nd interspace. Well healed incisions 1st/2nd MPJ. Mulder's click noted. Slight prominent 2nd met head. Right Foot: HAV right with POP, 2nd toe hammertoe with dorsal subluxation. Prominent 2nd met head  Assessment:   1. Interdigital neuroma of left foot    Plan:  Patient was evaluated and treated and all questions answered.  Interdigital Neuroma -At this point I think MRI is warranted to evaluate the neuroma.  -Patient desires to get back into high heeled shoes. I discussed with patient that I think this is a little unreasonable and that even with treatment I do not think her feet will get better if she will not wear shoes that are good for her foot.   HAV Right -Pending eval by Dr. Jacqualyn Posey her original surgeon for further evaluation.  Return in about 3 weeks (around 07/18/2020) for Neuroma.

## 2020-07-10 NOTE — Telephone Encounter (Signed)
Order was placed and message was sent to [facility] on 6/18 to do precert. Can we follow up and make sure it's done

## 2020-07-11 ENCOUNTER — Ambulatory Visit: Payer: 59 | Admitting: Podiatry

## 2020-07-11 NOTE — Telephone Encounter (Signed)
Called [employer] for pre-cert and rep stated that a physician peer to peer must be completed for the patient's MRI to be completed. Provider must call [phone] by 07/12/20 11:59pm.

## 2020-07-11 NOTE — Telephone Encounter (Signed)
Rachael Jordan, I just wanted to follow up on this precert for this MRI

## 2020-07-18 ENCOUNTER — Other Ambulatory Visit: Payer: Self-pay

## 2020-07-18 ENCOUNTER — Ambulatory Visit (INDEPENDENT_AMBULATORY_CARE_PROVIDER_SITE_OTHER): Payer: 59 | Admitting: Podiatry

## 2020-07-18 DIAGNOSIS — M21611 Bunion of right foot: Secondary | ICD-10-CM

## 2020-07-18 DIAGNOSIS — M778 Other enthesopathies, not elsewhere classified: Secondary | ICD-10-CM | POA: Diagnosis not present

## 2020-07-18 DIAGNOSIS — G5782 Other specified mononeuropathies of left lower limb: Secondary | ICD-10-CM

## 2020-07-20 ENCOUNTER — Telehealth: Payer: Self-pay | Admitting: Podiatry

## 2020-07-20 NOTE — Telephone Encounter (Signed)
Patient calling to request that her MRI referral be sent somewhere closer to her home in [place]. Patient states she does not want to come to [place] for imaging. Please advise.

## 2020-07-21 NOTE — Telephone Encounter (Signed)
Rachael Jordan- I put in an order for the MRI to be done at [facility]. Can you please let her know and also see if it needs a pre-cert? Thanks!

## 2020-07-21 NOTE — Progress Notes (Signed)
Subjective: 65 year old female presents the office today for evaluation of left foot pain.  She did not have the MRI completed.  She had previous injections which were not helpful.  She does still wear flat shoes which aggravates her symptoms.  The pain is intermittent and not daily but is becoming more consistent.  She denies recent injury or trauma to the left foot.  Also has a bunion on the right foot causing discomfort however the left foot causes more discomfort. Denies any systemic complaints such as fevers, chills, nausea, vomiting. No acute changes since last appointment, and no other complaints at this time.   Objective: AAO x3, NAD DP/PT pulses palpable bilaterally, CRT less than 3 seconds LEFT: There is tenderness palpation on the left foot on the second interspace.  Incisions in the prior surgery are all well-healed.  No pain with procedure range of motion she does not get significant discomfort this.  No crepitation.  There is prominence of the second and third metatarsal heads plantarly with atrophy of the fat pad. RIGHT: HAV is noted with tenderness palpation on the medial first metatarsal head.  Hammertoe present second digit with prominent metatarsal head plantarly. No pain with calf compression, swelling, warmth, erythema  Assessment: Left foot neuroma, chronic foot pain: right bunion  Plan: -All treatment options discussed with the patient including all alternatives, risks, complications.  -She has not yet had the MRI done.  We will reorder this for her today for left foot.  This is for potential surgical intervention and to evaluate for neuroma versus other pathology. -For the bunion on the right foot discussed offloading pads, shoe modifications today. -Patient encouraged to call the office with any questions, concerns, change in symptoms.   Follow-up after MRI or sooner if needed  Rachael Jordan DPM

## 2020-07-26 NOTE — Telephone Encounter (Signed)
I faxed a prior authorization over to [employer] today.

## 2020-08-18 ENCOUNTER — Telehealth: Payer: Self-pay | Admitting: *Deleted

## 2020-08-18 NOTE — Telephone Encounter (Signed)
Do you have the number for the peer to peer so I can complete it? Thanks.

## 2020-08-18 NOTE — Telephone Encounter (Signed)
Rachael Jordan w/ [facility] preservice center([facility]) is wanting to let Dr Jacqualyn Posey know that the patient was denied for the MRI of the foot (scheduled for 08/22/20) by insurance company. Please call and advise on Peer to Peer review.

## 2020-08-21 ENCOUNTER — Telehealth: Payer: Self-pay | Admitting: *Deleted

## 2020-08-21 ENCOUNTER — Telehealth: Payer: Self-pay | Admitting: Podiatry

## 2020-08-21 NOTE — Telephone Encounter (Signed)
Pt states her MRI was denied and cancelled due to her insurance. Pt called back and stated she called her insurance company and they told her she shouldn't have been denied, and would be covered. She would like to know what is going on?

## 2020-08-21 NOTE — Telephone Encounter (Signed)
The number is [phone]), choose provider option for peert o peer. Patient called and stated that if approved today before 6,may be abe to keep her upcoming appointment for Tues.

## 2020-08-21 NOTE — Telephone Encounter (Signed)
This was completed earlier and patient is aware. Scheduled for MRI tomorrow.

## 2020-08-21 NOTE — Telephone Encounter (Signed)
Called and spoke with the patient and stated that the approval for the MRI at [facility] was approved and Dr Jacqualyn Posey did a peer to peer today and the authorization number is [id number] and is valid today thru July 15th, 2022 and called the [facility] imaging department to get the patient scheduled and is for 10:45 am tomorrow and called the patient as well. Rachael Jordan

## 2020-08-21 NOTE — Telephone Encounter (Signed)
Peer to peer completed earlier today.

## 2020-08-22 ENCOUNTER — Ambulatory Visit: Admission: RE | Admit: 2020-08-22 | Payer: 59 | Source: Ambulatory Visit

## 2020-08-22 ENCOUNTER — Ambulatory Visit
Admission: RE | Admit: 2020-08-22 | Discharge: 2020-08-22 | Disposition: A | Discharge status: Home / Self Care | Payer: 59 | Source: Ambulatory Visit | Attending: Podiatry | Admitting: Podiatry

## 2020-08-22 ENCOUNTER — Other Ambulatory Visit: Payer: Self-pay

## 2020-08-22 DIAGNOSIS — G5782 Other specified mononeuropathies of left lower limb: Secondary | ICD-10-CM | POA: Insufficient documentation

## 2020-08-25 ENCOUNTER — Telehealth: Payer: Self-pay | Admitting: Podiatry

## 2020-08-25 NOTE — Telephone Encounter (Signed)
Pt is requesting someone to call her about her MRI results.

## 2020-08-28 ENCOUNTER — Telehealth: Payer: Self-pay | Admitting: *Deleted

## 2020-08-28 NOTE — Telephone Encounter (Signed)
Patient is calling for MRI test results. Please advise.

## 2020-08-28 NOTE — Telephone Encounter (Signed)
Message was not taken off machine, but has been addressed by physician.

## 2020-08-29 ENCOUNTER — Ambulatory Visit (INDEPENDENT_AMBULATORY_CARE_PROVIDER_SITE_OTHER): Payer: 59 | Admitting: Podiatry

## 2020-08-29 ENCOUNTER — Other Ambulatory Visit: Payer: Self-pay

## 2020-08-29 ENCOUNTER — Encounter: Payer: Self-pay | Admitting: Podiatry

## 2020-08-29 DIAGNOSIS — M778 Other enthesopathies, not elsewhere classified: Secondary | ICD-10-CM

## 2020-08-29 DIAGNOSIS — G5782 Other specified mononeuropathies of left lower limb: Secondary | ICD-10-CM

## 2020-08-29 MED ORDER — TRIAMCINOLONE ACETONIDE 10 MG/ML IJ SUSP
10.0000 mg | Freq: Once | INTRAMUSCULAR | Status: AC
  Administered 2020-08-29: 10 mg

## 2020-09-02 NOTE — Progress Notes (Signed)
Subjective: 65 year old female presents the office to discuss MRI as well as for chronic foot pain left foot.  She has been doing this now for some time and she is continued pain on a daily basis that she is using a cane.  No recent injury that she reports no increase in swelling.  She describes burning to the first 3 toes.  Also she states that when she pushes off she gets the burning to the toes when walking. Denies any systemic complaints such as fevers, chills, nausea, vomiting. No acute changes since last appointment, and no other complaints at this time.   Objective: AAO x3, NAD DP/PT pulses palpable bilaterally, CRT less than 3 seconds There is tenderness on the first interspace of the left foot and there is no specific area of tenderness however otherwise to the first 3 toes.  There is adequate range of motion of first MPJ without any crepitation.  There is no edema, erythema. No pain with calf compression, swelling, warmth, erythema  Assessment: 65 year old female with nerve symptoms left foot but also concern for if arthritis is causing part of this in her big toe joint.  Plan: -All treatment options discussed with the patient including all alternatives, risks, complications.  -I reviewed the MRI with her.  We discussed conservative as well as surgical options.  If she looks up surgery will do this later in the summer.  I discussed with her options including trying to remove the fibrosis, neuroma in the first interspace versus osseous work.  Today we did a steroid injection of the first interspace to see if this will be helpful.  I prepped the skin with alcohol and an injection of 1 cc Kenalog 10, 1 cc lidocaine plain was infiltrated into the first interspace on the area of tenderness without complications.  Postinjection care discussed.  She tolerated well.  Continue wearing shoes and good arch supports. -Patient encouraged to call the office with any questions, concerns, change in  symptoms.   Trula Slade DPM

## 2020-09-11 ENCOUNTER — Encounter: Payer: Self-pay | Admitting: Physician Assistant

## 2020-09-11 ENCOUNTER — Ambulatory Visit: Payer: 59 | Admitting: Physician Assistant

## 2020-09-11 ENCOUNTER — Other Ambulatory Visit: Payer: Self-pay

## 2020-09-11 DIAGNOSIS — G4710 Hypersomnia, unspecified: Secondary | ICD-10-CM | POA: Diagnosis not present

## 2020-09-11 DIAGNOSIS — Z9981 Dependence on supplemental oxygen: Secondary | ICD-10-CM

## 2020-09-11 DIAGNOSIS — J4520 Mild intermittent asthma, uncomplicated: Secondary | ICD-10-CM

## 2020-09-11 DIAGNOSIS — R0602 Shortness of breath: Secondary | ICD-10-CM | POA: Diagnosis not present

## 2020-09-11 DIAGNOSIS — G4734 Idiopathic sleep related nonobstructive alveolar hypoventilation: Secondary | ICD-10-CM | POA: Diagnosis not present

## 2020-09-11 DIAGNOSIS — J4489 Other specified chronic obstructive pulmonary disease: Secondary | ICD-10-CM

## 2020-09-11 DIAGNOSIS — J449 Chronic obstructive pulmonary disease, unspecified: Secondary | ICD-10-CM

## 2020-09-11 MED ORDER — BUDESONIDE-FORMOTEROL FUMARATE 80-4.5 MCG/ACT IN AERO: 2.0000 | INHALATION_SPRAY | Freq: Two times a day (BID) | RESPIRATORY_TRACT | 3 refills | Status: DC

## 2020-09-11 MED ORDER — ALBUTEROL SULFATE HFA 108 (90 BASE) MCG/ACT IN AERS: 2.0000 | INHALATION_SPRAY | Freq: Four times a day (QID) | RESPIRATORY_TRACT | 2 refills | Status: DC | PRN

## 2020-09-11 NOTE — Progress Notes (Signed)
[facility] [place], [place] [address]  Pulmonary Sleep Medicine   Office Visit Note  Patient Name: Rachael Jordan DOB: [birth year] MRN [id number]  Date of Service: 09/12/2020  Complaints/HPI: Pt is here for routine follow up. Pt is a little SOB and has chest tightness at times, but reports she has been without her inhalers for a long time Denies any ankle swelling. Has noticed the SOB/chest tightness and coughing correlated with sneezing and sitting outside the last 2 months with the increased pollen. Takes singulair and zyrtec. Has not been using nasal spray, but will start back on this. Oxygen 3 L at night previously and stopped using it the last few months because felt her nose was sore/getting dried out that she took a break and never restarted, but will start back on this now. Waking up with morning headaches recently. -Reports that she has been told previously that she stops breathing in her sleep and snores loudly. Does report a wheezing type noise that will wake her up. Often feels sleepy during daytime, but not to the point that she would say she would doze off. She had a HST in 2020 that did not meet criteria for OSA at that time, but may need to be repeated. She is at increased risk for respiratory depression due to pain medications as well. EPWORTH SLEEPINESS SCALE:  Scale:  (0)= no chance of dozing; (1)= slight chance of dozing; (2)= moderate chance of dozing; (3)= high chance of dozing  Chance  Situtation    Sitting and reading: 2    Watching TV: 2    Sitting Inactive in public: 0    As a passenger in car: 0      Lying down to rest: 2    Sitting and talking: 0    Sitting quielty after lunch: 0    In a car, stopped in traffic: 0   TOTAL SCORE:   6 out of 24   ROS  General: (-) fever, (-) chills, (-) night sweats, (-) weakness Skin: (-) rashes, (-) itching,. Eyes: (-) visual changes, (-) redness, (-) itching. Nose and Sinuses: (-)  nasal stuffiness or itchiness, (-) postnasal drip, (-) nosebleeds, (-) sinus trouble. Mouth and Throat: (-) sore throat, (-) hoarseness. Neck: (-) swollen glands, (-) enlarged thyroid, (-) neck pain. Respiratory: - cough, (-) bloody sputum, +shortness of breath, +wheezing. Cardiovascular: - ankle swelling, (-) chest pain. Lymphatic: (-) lymph node enlargement. Neurologic: (-) numbness, (-) tingling. Psychiatric: (-) anxiety, (-) depression   Current Medication: Outpatient Encounter Medications as of 09/11/2020  Medication Sig Note   albuterol (PROVENTIL) (2.5 MG/3ML) 0.083% nebulizer solution Take 2.5 mg by nebulization every 6 (six) hours as needed for wheezing or shortness of breath.    amLODipine (NORVASC) 5 MG tablet Take 5 mg by mouth daily.    aspirin EC 81 MG tablet Take 81 mg by mouth daily.    atorvastatin (LIPITOR) 20 MG tablet Take by mouth.    azelastine (ASTELIN) 0.1 % nasal spray Place 1-2 sprays into both nostrils 2 (two) times daily.     azelastine (ASTELIN) 0.1 % nasal spray Place into the nose.    Biotin 10000 MCG TABS Take 10 mg by mouth daily.     cetirizine (ZYRTEC) 10 MG tablet Take 10 mg by mouth at bedtime.     Cholecalciferol 25 MCG (1000 UT) tablet Take 1,000 Units by mouth daily.     gabapentin (NEURONTIN) 800 MG tablet Take 800 mg by  mouth 3 (three) times daily.     gabapentin (NEURONTIN) 800 MG tablet Take by mouth.    loratadine (CLARITIN) 10 MG tablet Take 10 mg by mouth daily.    montelukast (SINGULAIR) 10 MG tablet Take 10 mg by mouth in the morning.    montelukast (SINGULAIR) 10 MG tablet Take by mouth.    Multiple Vitamins-Minerals (CENTRUM ADULTS PO) Take 1 tablet by mouth daily.     nortriptyline (PAMELOR) 10 MG capsule Take 10 mg by mouth 2 (two) times daily.    olmesartan (BENICAR) 40 MG tablet Take 40 mg by mouth daily.    olmesartan (BENICAR) 40 MG tablet Take 1 tablet by mouth daily.    oxymorphone (OPANA) 10 MG tablet     pantoprazole  (PROTONIX) 40 MG tablet Take 40 mg by mouth daily.    predniSONE (DELTASONE) 10 MG tablet Take by mouth.    pyridOXINE (VITAMIN B-6) 100 MG tablet Take 100 mg by mouth daily.    rizatriptan (MAXALT) 10 MG tablet Take 10 mg by mouth as needed for migraine. May repeat in 2 hours if needed    tiZANidine (ZANAFLEX) 4 MG tablet Take 4 mg by mouth 3 (three) times daily.    tiZANidine (ZANAFLEX) 4 MG tablet Take by mouth.    traZODone (DESYREL) 100 MG tablet Take 200 mg by mouth at bedtime.     valACYclovir (VALTREX) 1000 MG tablet Take 1,000 mg by mouth daily as needed.    vitamin C (ASCORBIC ACID) 500 MG tablet Take 500 mg by mouth daily.    Vitamin E 45 MG CAPS Take 90 mg by mouth daily.    [DISCONTINUED] albuterol (VENTOLIN HFA) 108 (90 Base) MCG/ACT inhaler Inhale 2 puffs into the lungs every 6 (six) hours as needed for wheezing or shortness of breath.    [DISCONTINUED] budesonide-formoterol (SYMBICORT) 80-4.5 MCG/ACT inhaler Inhale 2 puffs into the lungs 2 (two) times daily.    albuterol (VENTOLIN HFA) 108 (90 Base) MCG/ACT inhaler Inhale 2 puffs into the lungs every 6 (six) hours as needed for wheezing or shortness of breath.    budesonide-formoterol (SYMBICORT) 80-4.5 MCG/ACT inhaler Inhale 2 puffs into the lungs 2 (two) times daily.    [DISCONTINUED] amoxicillin (AMOXIL) 500 MG tablet Take 500 mg by mouth 3 (three) times daily. (Patient not taking: Reported on 09/11/2020)    [DISCONTINUED] methylPREDNISolone (MEDROL DOSEPAK) 4 MG TBPK tablet Take as directed (Patient not taking: Reported on 06/29/2020)    [DISCONTINUED] methylPREDNISolone (MEDROL DOSEPAK) 4 MG TBPK tablet 6 Day Taper Pack. Take as Directed. (Patient not taking: Reported on 06/29/2020)    [DISCONTINUED] morphine (MSIR) 15 MG tablet Take 15 mg by mouth 2 (two) times daily as needed. (Patient not taking: Reported on 06/29/2020) 12/30/2019: New medication, not started yet   [DISCONTINUED] trospium (SANCTURA) 20 MG tablet Take 1 tablet by  mouth 2 (two) times daily. (Patient not taking: No sig reported)    No facility-administered encounter medications on file as of 09/11/2020.    Surgical History: Past Surgical History:  Procedure Laterality Date   ABDOMINAL HYSTERECTOMY     vaginal   APPENDECTOMY     BREAST BIOPSY     COLONOSCOPY N/A 01/02/2020   Procedure: COLONOSCOPY;  Surgeon: Toledo, Benay Pike, MD;  Location: [facility] ENDOSCOPY;  Service: Gastroenterology;  Laterality: N/A;   COLONOSCOPY WITH PROPOFOL N/A 12/29/2018   Procedure: COLONOSCOPY WITH PROPOFOL;  Surgeon: Lucilla Lame, MD;  Location: [facility] ENDOSCOPY;  Service: Endoscopy;  Laterality: N/A;  ESOPHAGOGASTRODUODENOSCOPY N/A 01/02/2020   Procedure: ESOPHAGOGASTRODUODENOSCOPY (EGD);  Surgeon: Toledo, Benay Pike, MD;  Location: [facility] ENDOSCOPY;  Service: Gastroenterology;  Laterality: N/A;   FOOT SURGERY Left    10/2015   NECK SURGERY      Medical History: Past Medical History:  Diagnosis Date   Acid reflux    Anxiety    Arthritis    Bronchitis 09/2015   CVA (cerebral vascular accident) ([place])    Gross hematuria    H. pylori infection    Heart murmur    Kidney failure    Labile essential hypertension 01/06/2018   Lung abnormality    "damaged lung due to pneumonia"   Neck pain    Nephrolithiasis    Pneumonia    Pneumothorax    Scoliosis    Seasonal allergies    Urinary frequency    Vaginal atrophy     Family History: Family History  Problem Relation Age of Onset   Arthritis Mother    Asthma Mother    Hyperlipidemia Mother    Hypertension Mother    Diabetes Sister    Cancer Maternal Aunt        esophagus   Breast cancer Neg Hx    Kidney cancer Neg Hx    Bladder Cancer Neg Hx     Social History: Social History   Socioeconomic History   Marital status: Legally Separated    Spouse name: Not on file   Number of children: Not on file   Years of education: Not on file   Highest education level: Not on file  Occupational History   Not on  file  Tobacco Use   Smoking status: Never   Smokeless tobacco: Never  Vaping Use   Vaping Use: Never used  Substance and Sexual Activity   Alcohol use: No    Alcohol/week: 0.0 standard drinks   Drug use: No   Sexual activity: Not on file  Other Topics Concern   Not on file  Social History Narrative   Not on file   Social Determinants of Health   Financial Resource Strain: Not on file  Food Insecurity: Not on file  Transportation Needs: Not on file  Physical Activity: Not on file  Stress: Not on file  Social Connections: Not on file  Intimate Partner Violence: Not on file    Vital Signs: Blood pressure 136/70, pulse (!) 105, temperature (!) 97.4 F (36.3 C), resp. rate 16, height 5' 5.5" (1.664 m), weight 153 lb 3.2 oz (69.5 kg), SpO2 99 %.  Examination: General Appearance: The patient is well-developed, well-nourished, and in no distress. Skin: Gross inspection of skin unremarkable. Head: normocephalic, no gross deformities. Eyes: no gross deformities noted. ENT: ears appear grossly normal no exudates. Neck: Supple. No thyromegaly. No LAD. Respiratory: Mild wheezing noted bilaterally. Cardiovascular: Normal S1 and S2 without murmur or rub. Extremities: No cyanosis. pulses are equal. Neurologic: Alert and oriented. No involuntary movements.  LABS: Recent Results (from the past 2160 hour(s))  Cytology - Non PAP; Thyroid isthmus nodule     Status: None   Collection Time: 06/29/20  2:43 PM  Result Value Ref Range   CYTOLOGY - NON GYN      CYTOLOGY - NON PAP CASE: [id number] PATIENT: Bettymae Yodice Non-Gynecological Cytology Report     Specimen Submitted: A. Thyroid, right isthmus; FNA  Clinical History: Suspicious thyroid isthmus nodule.      DIAGNOSIS: A. THYROID, RIGHT ISTHMUS NODULE; FNA: - BENIGN (B[place]ES[place] CATEGORY 2).  Comment This  is an adequately cellular specimen consisting of follicular epithelial cells predominantly in flat sheets  with no nuclear features to suggest papillary carcinoma.  Some slides show increased red blood cells.  There is a background of colloid and lymphocytes and some hemosiderin laden histiocytes.  These changes may represent hemorrhage into a pre-existing colloid nodule or be related to a previous procedure adjacent to this site.  Clinical and radiologic correlation would be helpful.  Slides examined: 1 ThinPrep and 10 smears.   GROSS DESCRIPTION: A. Site: Right isthmus thyroid Procedure: Ultrasound-guided FNA Cytotechnologist(s): [facility]   Specimen material collected and submitted for:  5 diff Quik stained slides  5 Pap stained slides Material for ThyroSeq if needed, labeled with the patients name, date of birth, specimen site, and collection date ThinPrep: Yes, 1  Specimen description: Fixative: Cytoloyt Volume: Approximately 20 mL Color: Pink Transparency: Clear Tissue fragments present: No  RB 06/29/2020   Final Diagnosis performed by Raynelle Bring, MD.   Electronically signed 06/30/2020 11:27:21AM The electronic signature indicates that the named Attending Pathologist has evaluated the specimen Technical component performed at [place], [address], [place], [place] [address] Lab: [phone] Dir: Rush Farmer, MD, MMM  Professional component performed at [facility], [facility], [place], [place], [place] [address] Lab: [phone] Dir: Dellia Nims. Reuel Derby, MD     Radiology: MR FOOT LEFT WO CONTRAST  Result Date: 08/22/2020 CLINICAL DATA:  Foot pain, chronic, Mortons neuroma suspected, xray done EXAM: MRI OF THE LEFT FOOT WITHOUT CONTRAST TECHNIQUE: Multiplanar, multisequence MR imaging of the left foot was performed. No intravenous contrast was administered. COMPARISON:  Left foot radiograph 05/23/2020 FINDINGS: Bones/Joint/Cartilage Postsurgical changes of great toe proximal phalanx and first metatarsal osteotomies with screw fixation. No  evidence of hardware complication. There is associated susceptibility artifact. There is no evidence of acute fracture. There is no significant marrow signal alteration. Ligaments Intact Lisfranc ligament.  No evidence of plantar plate tear Muscles and Tendons No significant muscle atrophy. No acute tendon tear in the forefoot. Soft tissues There is ill-defined low T1/T2 signal tissue between first and second metatarsal heads plantar aspect, this measures 9 mm in width (axial T1 image 15). IMPRESSION: Postsurgical changes of great toe proximal phalanx and first metatarsal osteotomies. No acute osseous abnormality. Ill-defined, plantar sided low T1/T2 signal between the first and second metatarsal heads, which likely represents fibrosis/neuroma. Electronically Signed   By: Maurine Simmering   On: 08/22/2020 15:28    No results found.  MR FOOT LEFT WO CONTRAST  Result Date: 08/22/2020 CLINICAL DATA:  Foot pain, chronic, Mortons neuroma suspected, xray done EXAM: MRI OF THE LEFT FOOT WITHOUT CONTRAST TECHNIQUE: Multiplanar, multisequence MR imaging of the left foot was performed. No intravenous contrast was administered. COMPARISON:  Left foot radiograph 05/23/2020 FINDINGS: Bones/Joint/Cartilage Postsurgical changes of great toe proximal phalanx and first metatarsal osteotomies with screw fixation. No evidence of hardware complication. There is associated susceptibility artifact. There is no evidence of acute fracture. There is no significant marrow signal alteration. Ligaments Intact Lisfranc ligament.  No evidence of plantar plate tear Muscles and Tendons No significant muscle atrophy. No acute tendon tear in the forefoot. Soft tissues There is ill-defined low T1/T2 signal tissue between first and second metatarsal heads plantar aspect, this measures 9 mm in width (axial T1 image 15). IMPRESSION: Postsurgical changes of great toe proximal phalanx and first metatarsal osteotomies. No acute osseous abnormality.  Ill-defined, plantar sided low T1/T2 signal between the first and second metatarsal  heads, which likely represents fibrosis/neuroma. Electronically Signed   By: Maurine Simmering   On: 08/22/2020 15:28      Assessment and Plan: Patient Active Problem List   Diagnosis Date Noted   Ischemic colitis ([place])    Rectal bleeding    Left lower quadrant abdominal pain    Lower GI bleed    Hypotension    Acute colitis 12/30/2019   Lumbar herniated disc 06/16/2019   Acute nonintractable headache    Multiple falls 02/20/2019   Myoclonic jerking 02/20/2019   COPD (chronic obstructive pulmonary disease) ([place]) 02/20/2019   Seasonal allergies 02/20/2019   Insomnia 02/20/2019   Depression with anxiety 02/20/2019   AKI (acute kidney injury) ([place]) 02/20/2019   Dizziness 02/18/2019   Encounter for screening colonoscopy    Bunion 05/06/2018   Metatarsalgia of right foot 05/06/2018   Age-related osteoporosis without current pathological fracture 03/31/2018   False positive ana 03/31/2018   Mouth dryness 02/18/2018   Screening for osteoporosis 02/18/2018   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/2018   Essential hypertension 01/06/2018   Lacunar infarction ([place]) 01/06/2018   Compression fracture of thoracic vertebra, sequela 07/12/2017   Lumbar compression fracture, sequela 07/12/2017   Nocturnal hypoxia 07/12/2017   Simple chronic bronchitis ([place]) 07/12/2017   Intractable back pain 06/05/2017   Overweight (BMI 25.0-29.9) 04/08/2017   Chronic pain syndrome 04/06/2017   Sensory disturbance 01/08/2017   Pain in the groin, right 10/28/2016   Paresthesia of both hands 12/14/2015   B12 deficiency 10/31/2015   SOB (shortness of breath) 08/29/2015   History of hematuria 07/12/2015   Urinary frequency 07/12/2015   Vaginal atrophy 07/12/2015   HAV (hallux abducto valgus) 07/02/2015   Overlapping toe 07/02/2015   Hammertoe 07/02/2015   LBP (low back pain) 01/17/2015   Trochanteric bursitis of right  hip 01/17/2015   PNA (pneumonia) 12/28/2014   DDD (degenerative disc disease), lumbar 08/15/2014   Lumbar radiculopathy 08/15/2014   Spinal stenosis, lumbar region, 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/15/2014   DDD (degenerative disc disease), cervical 08/15/2014   Bilateral occipital neuralgia 08/15/2014   Migraine 08/15/2014   Sacroiliac joint dysfunction 08/15/2014   Back pain, chronic 09/23/2013   Chronic cervical pain 09/23/2013   Arthralgia of temporomandibular joint 08/29/2013   Absolute anemia 08/15/2013   Chronic kidney disease (CKD), stage III (moderate) ([place]) 08/15/2013   Abnormal serum level of alkaline phosphatase 08/15/2013   Blush 08/15/2013   Hematuria, microscopic 08/15/2013   Inflamed nasal mucosa 08/15/2013   Big thyroid 08/15/2013   Disease of thyroid gland 08/15/2013   Positive H. pylori test 08/15/2013   Gastrointestinal ulcer due to Helicobacter pylori 16/12/9602   Other specified bacterial intestinal infections 08/14/2013   Acute kidney injury superimposed on CKD ([place]) 08/09/2013   SIRS (systemic inflammatory response syndrome) ([place]) 08/09/2013   Systemic inflammatory response syndrome (SIRS) ([place]) 08/09/2013    1. Mild intermittent asthma without complication Will restart on her Symbicort BID and albuterol as needed. Continue allergy medications. - albuterol (VENTOLIN HFA) 108 (90 Base) MCG/ACT inhaler; Inhale 2 puffs into the lungs every 6 (six) hours as needed for wheezing or shortness of breath.  Dispense: 18 g; Refill: 2  2. Hypersomnia Will order PSG to re-evaluate possible OSA/CSA given loud snoring, previously witnessed apneas, sleepiness, and opioid use for pain management  3. Nocturnal hypoxia Continue nightly oxygen  4. Oxygen dependent Continue nightly oxygen  5. Shortness of breath Will start back on inhalers, but pt also encouraged to f/u with cardiology/PCP if  SOB persists or any ankle swelling develops as an echo may be needed.might need  echo and or 6 min walk   - Spirometry with Graph --normal today however pt is symptomatic - budesonide-formoterol (SYMBICORT) 80-4.5 MCG/ACT inhaler; Inhale 2 puffs into the lungs 2 (two) times daily.  Dispense: 10.2 g; Refill: 3  6. Obstructive chronic bronchitis without exacerbation ([place]) Restart symbicort   General Counseling: I have discussed the findings of the evaluation and examination with [place].  I have also discussed any further diagnostic evaluation thatmay be needed or ordered today. Serene verbalizes understanding of the findings of todays visit. We also reviewed her medications today and discussed drug interactions and side effects including but not limited excessive drowsiness and altered mental states. We also discussed that there is always a risk not just to her but also people around her. she has been encouraged to call the office with any questions or concerns that should arise related to todays visit.  Orders Placed This Encounter  Procedures   Spirometry with Graph    Order Specific Question:   Where should this test be performed?    Answer:   [facility]    Order Specific Question:   Basic spirometry    Answer:   Yes   PSG SLEEP STUDY    Standing Status:   Future    Standing Expiration Date:   09/12/2021    Order Specific Question:   Where should this test be performed:    Answer:   [facility]     Time spent: 27  I have personally obtained a history, examined the patient, evaluated laboratory and imaging results, formulated the assessment and plan and placed orders. This patient was seen by Drema Dallas, PA-C in collaboration with Dr. Devona Konig as a part of collaborative care agreement.     Allyne Gee, MD [facility] Pulmonary and Critical Care Sleep medicine

## 2020-09-12 NOTE — Patient Instructions (Signed)

## 2020-09-14 ENCOUNTER — Ambulatory Visit
Payer: 59 | Attending: Student in an Organized Health Care Education/Training Program | Admitting: Student in an Organized Health Care Education/Training Program

## 2020-09-14 ENCOUNTER — Other Ambulatory Visit: Payer: Self-pay

## 2020-09-14 ENCOUNTER — Encounter: Payer: Self-pay | Admitting: Student in an Organized Health Care Education/Training Program

## 2020-09-14 VITALS — BP 138/88 | HR 102 | Temp 98.1°F | Resp 16 | Ht 65.0 in | Wt 151.0 lb

## 2020-09-14 DIAGNOSIS — G894 Chronic pain syndrome: Secondary | ICD-10-CM | POA: Insufficient documentation

## 2020-09-14 DIAGNOSIS — M5416 Radiculopathy, lumbar region: Secondary | ICD-10-CM | POA: Diagnosis not present

## 2020-09-14 DIAGNOSIS — G8929 Other chronic pain: Secondary | ICD-10-CM | POA: Diagnosis not present

## 2020-09-14 DIAGNOSIS — M792 Neuralgia and neuritis, unspecified: Secondary | ICD-10-CM | POA: Diagnosis present

## 2020-09-14 NOTE — Patient Instructions (Signed)
Moderate Conscious Sedation, Adult Sedation is the use of medicines to promote relaxation and to relieve discomfort and anxiety. Moderate conscious sedation is a type of sedation. Under moderate conscious sedation, you are less alert than normal, but you arestill able to respond to instructions, touch, or both. Moderate conscious sedation is used during short medical and dental procedures. It is milder than deep sedation, which is a type of sedation under which you cannot be easily woken up. It is also milder than general anesthesia, which is the use of medicines to make you unconscious. Moderate conscious sedationallows you to return to your regular activities sooner. Tell a health care provider about: Any allergies you have. All medicines you are taking, including vitamins, herbs, eye drops, creams, and over-the-counter medicines. Any use of steroids. This includes steroids taken by mouth or as a cream. Any problems you or family members have had with sedatives and anesthetic medicines. Any blood disorders you have. Any surgeries you have had. Any medical conditions you have, such as sleep apnea. Whether you are pregnant or may be pregnant. Any use of cigarettes, alcohol, marijuana, or drugs. What are the risks? Generally, this is a safe procedure. However, problems may occur, including: Getting too much medicine (oversedation). Nausea. Allergic reaction to medicines. Trouble breathing. If this happens, a breathing tube may be used. It will be removed when you are awake and breathing on your own. Heart trouble. Lung trouble. Confusion that gets better with time (emergence delirium). What happens before the procedure? Staying hydrated Follow instructions from your health care provider about hydration, which may include: Up to 2 hours before the procedure - you may continue to drink clear liquids, such as water, clear fruit juice, black coffee, and plain tea. Eating and drinking  restrictions Follow instructions from your health care provider about eating and drinking, which may include: 8 hours before the procedure - stop eating heavy meals or foods, such as meat, fried foods, or fatty foods. 6 hours before the procedure - stop eating light meals or foods, such as toast or cereal. 6 hours before the procedure - stop drinking milk or drinks that contain milk. 2 hours before the procedure - stop drinking clear liquids. Medicines Ask your health care provider about: Changing or stopping your regular medicines. This is especially important if you are taking diabetes medicines or blood thinners. Taking medicines such as aspirin and ibuprofen. These medicines can thin your blood. Do not take these medicines unless your health care provider tells you to take them. Taking over-the-counter medicines, vitamins, herbs, and supplements. Tests and exams You will have a physical exam. You may have blood tests done to show how well: Your kidneys and liver work. Your blood clots. General instructions Plan to have a responsible adult take you home from the hospital or clinic. If you will be going home right after the procedure, plan to have a responsible adult care for you for the time you are told. This is important. What happens during the procedure?  You will be given the sedative. The sedative may be given: As a pill that you will swallow. It can also be inserted into the rectum. As a spray through the nose. As an injection into the muscle. As an injection into the vein through an IV. You may be given oxygen as needed. Your breathing, heart rate, and blood pressure will be monitored during the procedure. The medical or dental procedure will be done. The procedure may vary among health care providers  and hospitals. What happens after the procedure? Your blood pressure, heart rate, breathing rate, and blood oxygen level will be monitored until you leave the hospital or  clinic. You will get fluids through your IV if needed. Do not drive or operate machinery until your health care provider says that it is safe. Summary Sedation is the use of medicines to promote relaxation and to relieve discomfort and anxiety. Moderate conscious sedation is a type of sedation that is used during short medical and dental procedures. Tell the health care provider about any medical conditions that you have and about all the medicines that you are taking. You will be given the sedative as a pill, a spray through the nose, an injection into the muscle, or an injection into the vein through an IV. Vital signs are monitored during the sedation. Moderate conscious sedation allows you to return to your regular activities sooner. This information is not intended to replace advice given to you by your health care provider. Make sure you discuss any questions you have with your healthcare provider. Document Revised: 07/02/2019 Document Reviewed: 01/28/2019 Elsevier Patient Education  2022 [place]  What are the risk, side effects and possible complications? Generally speaking, most procedures are safe.  However, with any procedure there are risks, side effects, and the possibility of complications.  The risks and complications are dependent upon the sites that are lesioned, or the type of nerve block to be performed.  The closer the procedure is to the spine, the more serious the risks are.  Great care is taken when placing the radio frequency needles, block needles or lesioning probes, but sometimes complications can occur. Infection: Any time there is an injection through the skin, there is a risk of infection.  This is why sterile conditions are used for these blocks.  There are four possible types of infection. Localized skin infection. Central Nervous System Infection-This can be in the form of Meningitis, which can be deadly. Epidural  Infections-This can be in the form of an epidural abscess, which can cause pressure inside of the spine, causing compression of the spinal cord with subsequent paralysis. This would require an emergency surgery to decompress, and there are no guarantees that the patient would recover from the paralysis. Discitis-This is an infection of the intervertebral discs.  It occurs in about 1% of discography procedures.  It is difficult to treat and it may lead to surgery.        2. Pain: the needles have to go through skin and soft tissues, will cause soreness.       3. Damage to internal structures:  The nerves to be lesioned may be near blood vessels or    other nerves which can be potentially damaged.       4. Bleeding: Bleeding is more common if the patient is taking blood thinners such as  aspirin, Coumadin, Ticiid, Plavix, etc., or if he/she have some genetic predisposition  such as hemophilia. Bleeding into the spinal canal can cause compression of the spinal  cord with subsequent paralysis.  This would require an emergency surgery to  decompress and there are no guarantees that the patient would recover from the  paralysis.       5. Pneumothorax:  Puncturing of a lung is a possibility, every time a needle is introduced in  the area of the chest or upper back.  Pneumothorax refers to free air around the  collapsed lung(s), inside  of the thoracic cavity (chest cavity).  Another two possible  complications related to a similar event would include: Hemothorax and Chylothorax.   These are variations of the Pneumothorax, where instead of air around the collapsed  lung(s), you may have blood or chyle, respectively.       6. Spinal headaches: They may occur with any procedures in the area of the spine.       7. Persistent CSF (Cerebro-Spinal Fluid) leakage: This is a rare problem, but may occur  with prolonged intrathecal or epidural catheters either due to the formation of a fistulous  track or a dural tear.        8. Nerve damage: By working so close to the spinal cord, there is always a possibility of  nerve damage, which could be as serious as a permanent spinal cord injury with  paralysis.       9. Death:  Although rare, severe deadly allergic reactions known as "Anaphylactic  reaction" can occur to any of the medications used.      10. Worsening of the symptoms:  We can always make thing worse.  What are the chances of something like this happening? Chances of any of this occuring are extremely low.  By statistics, you have more of a chance of getting killed in a motor vehicle accident: while driving to the hospital than any of the above occurring .  Nevertheless, you should be aware that they are possibilities.  In general, it is similar to taking a shower.  Everybody knows that you can slip, hit your head and get killed.  Does that mean that you should not shower again?  Nevertheless always keep in mind that statistics do not mean anything if you happen to be on the wrong side of them.  Even if a procedure has a 1 (one) in a 1,000,000 (million) chance of going wrong, it you happen to be that one..Also, keep in mind that by statistics, you have more of a chance of having something go wrong when taking medications.  Who should not have this procedure? If you are on a blood thinning medication (e.g. Coumadin, Plavix, see list of "Blood Thinners"), or if you have an active infection going on, you should not have the procedure.  If you are taking any blood thinners, please inform your physician.  How should I prepare for this procedure? Do not eat or drink anything at least six hours prior to the procedure. Bring a driver with you .  It cannot be a taxi. Come accompanied by an adult that can drive you back, and that is strong enough to help you if your legs get weak or numb from the local anesthetic. Take all of your medicines the morning of the procedure with just enough water to swallow them. If you have  diabetes, make sure that you are scheduled to have your procedure done first thing in the morning, whenever possible. If you have diabetes, take only half of your insulin dose and notify our nurse that you have done so as soon as you arrive at the clinic. If you are diabetic, but only take blood sugar pills (oral hypoglycemic), then do not take them on the morning of your procedure.  You may take them after you have had the procedure. Do not take aspirin or any aspirin-containing medications, at least eleven (11) days prior to the procedure.  They may prolong bleeding. Wear loose fitting clothing that may be easy to take off  and that you would not mind if it got stained with Betadine or blood. Do not wear any jewelry or perfume Remove any nail coloring.  It will interfere with some of our monitoring equipment.  NOTE: Remember that this is not meant to be interpreted as a complete list of all possible complications.  Unforeseen problems may occur.  BLOOD THINNERS The following drugs contain aspirin or other products, which can cause increased bleeding during surgery and should not be taken for 2 weeks prior to and 1 week after surgery.  If you should need take something for relief of minor pain, you may take acetaminophen which is found in Tylenol,m Datril, Anacin-3 and Panadol. It is not blood thinner. The products listed below are.  Do not take any of the products listed below in addition to any listed on your instruction sheet.  A.P.C or A.P.C with Codeine Codeine Phosphate Capsules #3 Ibuprofen Ridaura  ABC compound Congesprin Imuran rimadil  Advil Cope Indocin Robaxisal  Alka-Seltzer Effervescent Pain Reliever and Antacid Coricidin or Coricidin-D  Indomethacin Rufen  Alka-Seltzer plus Cold Medicine Cosprin Ketoprofen S-A-C Tablets  Anacin Analgesic Tablets or Capsules Coumadin Korlgesic Salflex  Anacin Extra Strength Analgesic tablets or capsules CP-2 Tablets Lanoril Salicylate  Anaprox  Cuprimine Capsules Levenox Salocol  Anexsia-D Dalteparin Magan Salsalate  Anodynos Darvon compound Magnesium Salicylate Sine-off  Ansaid Dasin Capsules Magsal Sodium Salicylate  Anturane Depen Capsules Marnal Soma  APF Arthritis pain formula Dewitt's Pills Measurin Stanback  Argesic Dia-Gesic Meclofenamic Sulfinpyrazone  Arthritis Bayer Timed Release Aspirin Diclofenac Meclomen Sulindac  Arthritis pain formula Anacin Dicumarol Medipren Supac  Analgesic (Safety coated) Arthralgen Diffunasal Mefanamic Suprofen  Arthritis Strength Bufferin Dihydrocodeine Mepro Compound Suprol  Arthropan liquid Dopirydamole Methcarbomol with Aspirin Synalgos  ASA tablets/Enseals Disalcid Micrainin Tagament  Ascriptin Doan's Midol Talwin  Ascriptin A/D Dolene Mobidin Tanderil  Ascriptin Extra Strength Dolobid Moblgesic Ticlid  Ascriptin with Codeine Doloprin or Doloprin with Codeine Momentum Tolectin  Asperbuf Duoprin Mono-gesic Trendar  Aspergum Duradyne Motrin or Motrin IB Triminicin  Aspirin plain, buffered or enteric coated Durasal Myochrisine Trigesic  Aspirin Suppositories Easprin Nalfon Trillsate  Aspirin with Codeine Ecotrin Regular or Extra Strength Naprosyn Uracel  Atromid-S Efficin Naproxen Ursinus  Auranofin Capsules Elmiron Neocylate Vanquish  Axotal Emagrin Norgesic Verin  Azathioprine Empirin or Empirin with Codeine Normiflo Vitamin E  Azolid Emprazil Nuprin Voltaren  Bayer Aspirin plain, buffered or children's or timed BC Tablets or powders Encaprin Orgaran Warfarin Sodium  Buff-a-Comp Enoxaparin Orudis Zorpin  Buff-a-Comp with Codeine Equegesic Os-Cal-Gesic   Buffaprin Excedrin plain, buffered or Extra Strength Oxalid   Bufferin Arthritis Strength Feldene Oxphenbutazone   Bufferin plain or Extra Strength Feldene Capsules Oxycodone with Aspirin   Bufferin with Codeine Fenoprofen Fenoprofen Pabalate or Pabalate-SF   Buffets II Flogesic Panagesic   Buffinol plain or Extra Strength Florinal  or Florinal with Codeine Panwarfarin   Buf-Tabs Flurbiprofen Penicillamine   Butalbital Compound Four-way cold tablets Penicillin   Butazolidin Fragmin Pepto-Bismol   Carbenicillin Geminisyn Percodan   Carna Arthritis Reliever Geopen Persantine   Carprofen Gold's salt Persistin   Chloramphenicol Goody's Phenylbutazone   Chloromycetin Haltrain Piroxlcam   Clmetidine heparin Plaquenil   Cllnoril Hyco-pap Ponstel   Clofibrate Hydroxy chloroquine Propoxyphen         Before stopping any of these medications, be sure to consult the physician who ordered them.  Some, such as Coumadin (Warfarin) are ordered to prevent or treat serious conditions such as "deep thrombosis", "pumonary embolisms", and other heart  problems.  The amount of time that you may need off of the medication may also vary with the medication and the reason for which you were taking it.  If you are taking any of these medications, please make sure you notify your pain physician before you undergo any procedures.     Epidural Steroid Injection Patient Information  Description: The epidural space surrounds the nerves as they exit the spinal cord.  In some patients, the nerves can be compressed and inflamed by a bulging disc or a tight spinal canal (spinal stenosis).  By injecting steroids into the epidural space, we can bring irritated nerves into direct contact with a potentially helpful medication.  These steroids act directly on the irritated nerves and can reduce swelling and inflammation which often leads to decreased pain.  Epidural steroids may be injected anywhere along the spine and from the neck to the low back depending upon the location of your pain.   After numbing the skin with local anesthetic (like Novocaine), a small needle is passed into the epidural space slowly.  You may experience a sensation of pressure while this is being done.  The entire block usually last less than 10 minutes.  Conditions which may be  treated by epidural steroids:  Low back and leg pain Neck and arm pain Spinal stenosis Post-laminectomy syndrome Herpes zoster (shingles) pain Pain from compression fractures  Preparation for the injection:  Do not eat any solid food or dairy products within 8 hours of your appointment.  You may drink clear liquids up to 3 hours before appointment.  Clear liquids include water, black coffee, juice or soda.  No milk or cream please. You may take your regular medication, including pain medications, with a sip of water before your appointment  Diabetics should hold regular insulin (if taken separately) and take 1/2 normal NPH dos the morning of the procedure.  Carry some sugar containing items with you to your appointment. A driver must accompany you and be prepared to drive you home after your procedure.  Bring all your current medications with your. An IV may be inserted and sedation may be given at the discretion of the physician.   A blood pressure cuff, EKG and other monitors will often be applied during the procedure.  Some patients may need to have extra oxygen administered for a short period. You will be asked to provide medical information, including your allergies, prior to the procedure.  We must know immediately if you are taking blood thinners (like Coumadin/Warfarin)  Or if you are allergic to IV iodine contrast (dye). We must know if you could possible be pregnant.  Possible side-effects: Bleeding from needle site Infection (rare, may require surgery) Nerve injury (rare) Numbness & tingling (temporary) Difficulty urinating (rare, temporary) Spinal headache ( a headache worse with upright posture) Light -headedness (temporary) Pain at injection site (several days) Decreased blood pressure (temporary) Weakness in arm/leg (temporary) Pressure sensation in back/neck (temporary)  Call if you experience: Fever/chills associated with headache or increased back/neck  pain. Headache worsened by an upright position. New onset weakness or numbness of an extremity below the injection site Hives or difficulty breathing (go to the emergency room) Inflammation or drainage at the infection site Severe back/neck pain Any new symptoms which are concerning to you  Please note:  Although the local anesthetic injected can often make your back or neck feel good for several hours after the injection, the pain will likely return.  It takes 3-7  days for steroids to work in the epidural space.  You may not notice any pain relief for at least that one week.  If effective, we will often do a series of three injections spaced 3-6 weeks apart to maximally decrease your pain.  After the initial series, we generally will wait several months before considering a repeat injection of the same type.  If you have any questions, please call [phone] [facility]

## 2020-09-14 NOTE — Progress Notes (Signed)
PROVIDER NOTE: Information contained herein reflects review and annotations entered in association with encounter. Interpretation of such information and data should be left to medically-trained personnel. Information provided to patient can be located elsewhere in the medical record under "Patient Instructions". Document created using STT-dictation technology, any transcriptional errors that may result from process are unintentional.    Patient: Rachael Jordan  Service Category: E/M  Provider: Gillis Santa, MD  DOB: Oct 23, 1955  DOS: 09/14/2020  Specialty: Interventional Pain Management  MRN: [id number]  Setting: Ambulatory outpatient  PCP: Sharyne Peach, MD  Type: Established Patient    Referring Provider: Sharyne Peach, MD  Location: Office  Delivery: Face-to-face     HPI  Rachael Jordan, a 65 y.o. year old female, is here today because of her Chronic radicular lumbar pain [M54.16, G89.29]. Rachael Jordan primary complain today is Back Pain  Pertinent problems: Rachael Jordan has DDD (degenerative disc disease), lumbar; Lumbar radiculopathy; Spinal stenosis, lumbar region, with neurogenic claudication; Back pain, chronic; and Chronic pain syndrome on their pertinent problem list. Pain Assessment: Severity of Chronic pain is reported as a 9 /10. Location: Back Lower/radiates into both hips and down right leg. Onset: More than a month ago. Quality: Hervey Ard, Aching. Timing: Constant. Modifying factor(s): medications, ice, rest. Vitals:  height is 5' 5"  (1.651 m) and weight is 151 lb (68.5 kg). Her temperature is 98.1 F (36.7 C). Her blood pressure is 138/88 and her pulse is 102 (abnormal). Her respiration is 16 and oxygen saturation is 99%.   Reason for encounter:   Ms. Comrie last visit with me was in September 2021 for a spinal cord stimulator trial which was not successful.  She follows up today for increased low back pain with radiation into her bilateral hips and down her posterior  lateral leg.  She is interested in having a lumbar epidural steroid injection done for her symptoms.  ROS  Constitutional: Denies any fever or chills Gastrointestinal: No reported hemesis, hematochezia, vomiting, or acute GI distress Musculoskeletal:  Low back pain with radiation into right lower extremity in a dermatomal fashion Neurological: No reported episodes of acute onset apraxia, aphasia, dysarthria, agnosia, amnesia, paralysis, loss of coordination, or loss of consciousness  Medication Review  Biotin, Cholecalciferol, Multiple Vitamins-Minerals, Vitamin E, albuterol, amLODipine, aspirin EC, atorvastatin, azelastine, budesonide-formoterol, cetirizine, diclofenac Sodium, gabapentin, loratadine, montelukast, naloxone, nortriptyline, olmesartan, oxymorphone, pantoprazole, predniSONE, pyridOXINE, rizatriptan, tiZANidine, traZODone, valACYclovir, and vitamin C  History Review  Allergy: Rachael Jordan is allergic to morphine and related, oxymorphone, grapefruit extract, oxycodone, and propoxyphene. Drug: Rachael Jordan  reports no history of drug use. Alcohol:  reports no history of alcohol use. Tobacco:  reports that she has never smoked. She has never used smokeless tobacco. Social: Rachael Jordan  reports that she has never smoked. She has never used smokeless tobacco. She reports that she does not drink alcohol and does not use drugs. Medical:  has a past medical history of Acid reflux, Anxiety, Arthritis, Bronchitis (09/2015), CVA (cerebral vascular accident) ([place]), Gross hematuria, H. 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 Surgical: Rachael Jordan  has a past surgical history that includes Appendectomy; Abdominal hysterectomy; Neck surgery; Foot surgery (Left); Colonoscopy with propofol (N/A, 12/29/2018);  Esophagogastroduodenoscopy (N/A, 01/02/2020); Colonoscopy (N/A, 01/02/2020); and Breast biopsy. Family: family history includes Arthritis in her mother; Asthma in her mother; Cancer in her maternal aunt; Diabetes in her sister; Hyperlipidemia  in her mother; Hypertension in her mother.  Laboratory Chemistry Profile   Renal Lab Results  Component Value Date   BUN 5 (L) 01/04/2020   CREATININE 1.00 01/04/2020   LABCREA 157 12/28/2018   BCR 11 (L) 09/29/2017   GFRAA 58 (L) 06/18/2019   GFRNONAA >60 01/04/2020    Hepatic Lab Results  Component Value Date   AST 19 12/31/2019   ALT 14 12/31/2019   ALBUMIN 3.7 12/31/2019   ALKPHOS 54 12/31/2019   LIPASE 25 12/30/2019    Electrolytes Lab Results  Component Value Date   NA 143 01/04/2020   K 3.3 (L) 01/04/2020   CL 107 01/04/2020   CALCIUM 8.9 01/04/2020   MG 2.1 12/31/2019   PHOS 4.5 12/31/2019    Bone No results found for: VD25OH, VD125OH2TOT, QZ3007MA2, QJ3354TG2, 25OHVITD1, 25OHVITD2, 25OHVITD3, TESTOFREE, TESTOSTERONE  Inflammation (CRP: Acute Phase) (ESR: Chronic Phase) Lab Results  Component Value Date   LATICACIDVEN 1.1 12/30/2019          Note: Above Lab results reviewed.  Recent Imaging Review  MR FOOT LEFT WO CONTRAST CLINICAL DATA:  Foot pain, chronic, Mortons neuroma suspected, xray done  EXAM: MRI OF THE LEFT FOOT WITHOUT CONTRAST  TECHNIQUE: Multiplanar, multisequence MR imaging of the left foot was performed. No intravenous contrast was administered.  COMPARISON:  Left foot radiograph 05/23/2020  FINDINGS: Bones/Joint/Cartilage  Postsurgical changes of great toe proximal phalanx and first metatarsal osteotomies with screw fixation. No evidence of hardware complication. There is associated susceptibility artifact. There is no evidence of acute fracture. There is no significant marrow signal alteration.  Ligaments  Intact Lisfranc ligament.  No evidence of plantar plate tear  Muscles and  Tendons  No significant muscle atrophy. No acute tendon tear in the forefoot.  Soft tissues  There is ill-defined low T1/T2 signal tissue between first and second metatarsal heads plantar aspect, this measures 9 mm in width (axial T1 image 15).  IMPRESSION: Postsurgical changes of great toe proximal phalanx and first metatarsal osteotomies. No acute osseous abnormality.  Ill-defined, plantar sided low T1/T2 signal between the first and second metatarsal heads, which likely represents fibrosis/neuroma.  Electronically Signed   By: Maurine Simmering   On: 08/22/2020 15:28 Note: Reviewed          Physical Exam  General appearance: Well nourished, well developed, and well hydrated. In no apparent acute distress Mental status: Alert, oriented x 3 (person, place, & time)       Respiratory: No evidence of acute respiratory distress Eyes: PERLA Vitals: BP 138/88   Pulse (!) 102   Temp 98.1 F (36.7 C)   Resp 16   Ht 5' 5"  (1.651 m)   Wt 151 lb (68.5 kg)   SpO2 99%   BMI 25.13 kg/m  BMI: Estimated body mass index is 25.13 kg/m as calculated from the following:   Height as of this encounter: 5' 5"  (1.651 m).   Weight as of this encounter: 151 lb (68.5 kg). Ideal: Ideal body weight: 57 kg (125 lb 10.6 oz) Adjusted ideal body weight: 61.6 kg (135 lb 12.8 oz)    Lumbar Exam  Skin & Axial Inspection: No masses, redness, or swelling Alignment: Symmetrical Functional ROM: Pain restricted ROM affecting both sides Stability: No instability detected Muscle Tone/Strength: Functionally intact. No obvious neuro-muscular anomalies detected. Sensory (Neurological): Neurogenic pain pattern   Provocative Tests: Hyperextension/rotation test: (+) due to pain. Lumbar quadrant test (Kemp's test): (+) due to pain. Lateral bending test: deferred  today       Patrick's Maneuver: deferred today                   FABER* test: deferred today                   S-I anterior distraction/compression  test: deferred today         S-I lateral compression test: deferred today         S-I Thigh-thrust test: deferred today         S-I Gaenslen's test: deferred today         *(Flexion, ABduction and External Rotation)   Gait & Posture Assessment  Ambulation: Limited Gait: Antalgic gait (limping) Posture: Difficulty standing up straight, due to pain    Lower Extremity Exam      Side: Right lower extremity   Side: Left lower extremity  Stability: No instability observed           Stability: No instability observed          Skin & Extremity Inspection: Skin color, temperature, and hair growth are WNL. No peripheral edema or cyanosis. No masses, redness, swelling, asymmetry, or associated skin lesions. No contractures.   Skin & Extremity Inspection: Skin color, temperature, and hair growth are WNL. No peripheral edema or cyanosis. No masses, redness, swelling, asymmetry, or associated skin lesions. No contractures.  Functional ROM: Pain restricted ROM for hip and knee joints           Functional ROM: Pain restricted ROM for hip and knee joints          Muscle Tone/Strength: Functionally intact. No obvious neuro-muscular anomalies detected.   Muscle Tone/Strength: Functionally intact. No obvious neuro-muscular anomalies detected.  Sensory (Neurological): Neurogenic pain pattern         Sensory (Neurological): Neurogenic pain pattern        DTR: Patellar: deferred today Achilles: deferred today Plantar: deferred today   DTR: Patellar: deferred today Achilles: deferred today Plantar: deferred today  Palpation: No palpable anomalies   Palpation: No palpable anomalies    Assessment   Status Diagnosis  Having a Flare-up Having a Flare-up Having a Flare-up 1. Chronic radicular lumbar pain   2. Lumbar radiculopathy   3. Chronic pain syndrome   4. Neuropathic pain of left lower extremity   5. Neuropathic pain of right lower extremity       Plan of Care   Ms. Lalaine Hacking has a  current medication list which includes the following long-term medication(s): albuterol, albuterol, azelastine, azelastine, budesonide-formoterol, cetirizine, montelukast, rizatriptan, trazodone, and montelukast.   Increase in lumbar radicular pain.  Has failed spinal cord stimulator trial.  Plan for lumbar epidural steroid injection right-sided.  With p.o. Valium.  Orders:  Orders Placed This Encounter  Procedures   Lumbar Epidural Injection    Standing Status:   Future    Standing Expiration Date:   10/14/2020    Scheduling Instructions:     Procedure: Interlaminar Lumbar Epidural Steroid injection (LESI)            Laterality: Midline     Sedation: Patient's choice with PO Valium     Timeframe: ASAA    Order Specific Question:   Where will this procedure be performed?    Answer:   [facility]    Follow-up plan:   Return in about 20 days (around 10/04/2020) for L-ESI with PO Valium.    Recent Visits No visits were found  meeting these conditions. Showing recent visits within past 90 days and meeting all other requirements Today's Visits Date Type Provider Dept  09/14/20 Office Visit Gillis Santa, MD [facility]  Showing today's visits and meeting all other requirements Future Appointments No visits were found meeting these conditions. Showing future appointments within next 90 days and meeting all other requirements I discussed the assessment and treatment plan with the patient. The patient was provided an opportunity to ask questions and all were answered. The patient agreed with the plan and demonstrated an understanding of the instructions.  Patient advised to call back or seek an in-person evaluation if the symptoms or condition worsens.  Duration of encounter: 30 minutes.  Note by: Gillis Santa, MD Date: 09/14/2020; Time: 3:20 PM

## 2020-09-14 NOTE — Progress Notes (Signed)
Safety precautions to be maintained throughout the outpatient stay will include: orient to surroundings, keep bed in low position, maintain call bell within reach at all times, provide assistance with transfer out of bed and ambulation.  

## 2020-10-03 ENCOUNTER — Ambulatory Visit (INDEPENDENT_AMBULATORY_CARE_PROVIDER_SITE_OTHER): Payer: 59 | Admitting: Podiatry

## 2020-10-03 ENCOUNTER — Other Ambulatory Visit: Payer: Self-pay

## 2020-10-03 DIAGNOSIS — M778 Other enthesopathies, not elsewhere classified: Secondary | ICD-10-CM

## 2020-10-03 DIAGNOSIS — G5782 Other specified mononeuropathies of left lower limb: Secondary | ICD-10-CM

## 2020-10-03 MED ORDER — BETAMETHASONE SOD PHOS & ACET 6 (3-3) MG/ML IJ SUSP: 3.0000 mg | Freq: Once | INTRAMUSCULAR | Status: DC

## 2020-10-03 NOTE — Progress Notes (Signed)
  Subjective:  [name], female    DOB: [birth year],  MRN: [id number]  Chief Complaint  Patient presents with   Interdigital neurima    Follow up left left foot neuroma. Pt requesting an injection today.     65 y.o. female presents with the above complaint. History confirmed with patient. States the injection helped but is hurting again. She is going out of town this weekend and would like another shot for additional relief prior to this trip. Objective:  Physical Exam: warm, good capillary refill, no trophic changes or ulcerative lesions, normal DP and PT pulses, and normal sensory exam. Well healed scars 1st/2nd toes Left Foot: tenderness between the 1st and 2nd metatarsal head at the 1st interspace. No pain at 1st MPJ today.  Assessment:   1. Interdigital neuroma of left foot   2. Capsulitis of left foot    Plan:  Patient was evaluated and treated and all questions answered.  Interdigital Neuroma -Injection delivered to the affected interspaces  Procedure: Neuroma Injection Location: Left 1st interspace Skin Prep: Alcohol. Injectate: 0.5 cc 0.5% marcaine plain, 0.5 cc betamethasone acetate-betamethasone sodium phosphate Disposition: Patient tolerated procedure well. Injection site dressed with a band-aid.  No follow-ups on file.

## 2020-10-17 ENCOUNTER — Encounter: Payer: Self-pay | Admitting: Podiatry

## 2020-10-17 ENCOUNTER — Ambulatory Visit (INDEPENDENT_AMBULATORY_CARE_PROVIDER_SITE_OTHER): Payer: 59 | Admitting: Podiatry

## 2020-10-17 ENCOUNTER — Other Ambulatory Visit: Payer: Self-pay

## 2020-10-17 DIAGNOSIS — M778 Other enthesopathies, not elsewhere classified: Secondary | ICD-10-CM

## 2020-10-17 DIAGNOSIS — M79672 Pain in left foot: Secondary | ICD-10-CM

## 2020-10-17 DIAGNOSIS — G8929 Other chronic pain: Secondary | ICD-10-CM

## 2020-10-17 DIAGNOSIS — G5782 Other specified mononeuropathies of left lower limb: Secondary | ICD-10-CM | POA: Diagnosis not present

## 2020-10-20 NOTE — Progress Notes (Signed)
Subjective: 65 year old female presents the office today for follow-up evaluation of left foot pain.  She said the right foot starting to the same thing but she thinks is from compensation.  The injection that she had about 3 times is been helpful in the first interspace.  She describes burning discomfort into the toes.  She does have chronic back pain and she is treated at [facility] for this.   Denies any systemic complaints such as fevers, chills, nausea, vomiting. No acute changes since last appointment, and no other complaints at this time.   Objective: AAO x3, NAD DP/PT pulses palpable bilaterally, CRT less than 3 seconds There is tenderness palpation still in the first interspace most in the left foot.  Slight discomfort on the right side but mostly the left side.  No edema, erythema.  There is no erythema point tenderness.  No other discomfort.  Flexor, extensor tendons appear intact. No open lesions or pre-ulcerative lesions.  No pain with calf compression, swelling, warmth, erythema  Assessment: Left foot likely neuroma  Plan: -All treatment options discussed with the patient including all alternatives, risks, complications.  -Previous injections of been helpful.  We discussed with conservative as well as surgical treatment options.  Also discussed other reasons for why she could possibly having burning to her feet including her chronic pain, back pain.  She does follow-up for the back pain as well.  After discussion we decided to proceed with a dehydrated sclerosing alcohol injection.  Skin was prepped with alcohol and a mixture of the dehydrated sclerosing injection was infiltrated in the first interspace on the area of maximal tenderness without complications.  A total of 1.1 cc was infiltrated.  She tolerated well.  Postinjection care discussed. -Patient encouraged to call the office with any questions, concerns, change in symptoms.   Return in about 3 weeks (around  11/07/2020).  Trula Slade DPM

## 2020-11-09 ENCOUNTER — Other Ambulatory Visit: Payer: Self-pay

## 2020-11-09 ENCOUNTER — Ambulatory Visit (INDEPENDENT_AMBULATORY_CARE_PROVIDER_SITE_OTHER): Payer: 59 | Admitting: Podiatry

## 2020-11-09 DIAGNOSIS — G5782 Other specified mononeuropathies of left lower limb: Secondary | ICD-10-CM | POA: Diagnosis not present

## 2020-11-15 NOTE — Progress Notes (Signed)
Subjective: 65 year old female presents the office today for concerns of continued pain to her left foot.  She said that she still expected discomfort of initial burning, nerve pain to her feet and she started with symptoms on the right side..  She has had a steroid injection which was helpful.  We did a dehydrated sclerosing alcohol injection last appointment she has not seen any relief from the first injection.  She previously had an MRI of the left foot that showed postsurgical changes of the big toe, first metatarsal osteotomy with a ill-defined low-grade single between the first and second metatarsal heads which could result represent fibrosis versus neuroma.   She does have a history of chronic radicular lumbar pain and she had injections previously for this.  Objective: AAO x3, NAD There is no area of pinpoint tenderness.  Majority tenderness is along the left first interspace.  She gets some same symptoms on the right side along the first interspace as well.  Describes burning, sharp pain.  No edema, erythema.  Toes are in rectus position.  No pain with calf compression, swelling, warmth, erythema  Assessment: 65 year old female with possible neuroma, ?  Lumbar radiculopathy causing her symptoms  Plan: -All treatment options discussed with the patient including all alternatives, risks, complications.  -Today a second dehydrated sclerosing injection was infiltrated into the first interspace.  Skin skin with alcohol and a mixture of 1 cc of the sclerosing injection was infiltrated into the area of tenderness without complications.  Postinjection care discussed.  Tolerated the procedure well and complications.  Continue shoe modifications and offloading. -Patient encouraged to call the office with any questions, concerns, change in symptoms.   Trula Slade DPM

## 2020-11-28 ENCOUNTER — Other Ambulatory Visit: Payer: Self-pay

## 2020-11-28 ENCOUNTER — Ambulatory Visit (INDEPENDENT_AMBULATORY_CARE_PROVIDER_SITE_OTHER): Payer: 59 | Admitting: Podiatry

## 2020-11-28 DIAGNOSIS — G5782 Other specified mononeuropathies of left lower limb: Secondary | ICD-10-CM | POA: Diagnosis not present

## 2020-12-03 NOTE — Progress Notes (Signed)
Subjective: 65 year old female presents the office today for concerns of continued pain to her left foot.  She states that the second injection did help some.  States that she still has difficulty wearing shoes.  She has no other concerns or any changes otherwise to her feet.  She does have a history of chronic radicular lumbar pain and she had injections previously for this.  Objective: AAO x3, NAD There is no area of pinpoint tenderness.  Majority tenderness is along the left first interspace.  Still tenderness palpation on the area.  She had some subjective for the last injection.  There is no edema, erythema.  There is no area of pinpoint tenderness.  She does get some discomfort submetatarsal 3-5-this is more from compensation as a not able to elicit any tenderness today.  There is no pain with MPJ range of motion.  The toes are in rectus position. No pain with calf compression, swelling, warmth, erythema  Assessment: 65 year old female with possible neuroma  Plan: -All treatment options discussed with the patient including all alternatives, risks, complications.  -I did discuss with her that often do not think that things to her feet were coming from her back. -Today a third dehydrated sclerosing injection was infiltrated into the first interspace.  Skin skin with alcohol and a mixture of 1 cc of the sclerosing injection was infiltrated into the area of tenderness without complications.  Postinjection care discussed.  Tolerated the procedure well and complications.  Continue shoe modifications and offloading. -We will plan on another 1 or 2 injections if this continues to be helpful we can continue with this however if symptoms persist discussed surgical intervention. -Patient encouraged to call the office with any questions, concerns, change in symptoms.   Trula Slade DPM

## 2020-12-06 NOTE — Progress Notes (Signed)
12/24/20 4:44 PM   [name] 08/04/1955 [id number]  Referring provider:  Sharyne Peach, MD [address] [place],  [place] [address]  Chief Complaint  Patient presents with   Urinary Incontinence   Urological history:  1. High risk hematuria -non-smoker -hematuria work up in 06/2014 was negative  -CT urogram 10/2017 NED -Cystoscopy in 10/2017 NED  -12/2020 renal biopsy showed severe arterial sclerosis and mild interstitial fibrosis and tubular atrophy  -contrast CT 12/2019 no GU findings   -no reports of gross heme -UA negative for micro heme  2. OAB -contributing factors of age, vaginal atrophy, COPD and antihistamines -discontinued PTNS treatments  -Myrbetriq was cost prohibitive  -was managed on Sanctura -PVR 63 mL   3. Incontinence  -contributing factors of age, COPD, sleep apnea, vaginal atrophy, COPD, antihistamines, chronic opioid use and depression  HPI: Rachael Jordan is a 65 y.o.female who presents today for follow-up with UA, OAB questionnaire, and PVR.   UA negative for micro heme  PVR 63 mL  She experiences 8 or more urination during the day and 3 or more at night. She experiences an urge to urinate, mild leakage when she laughs, coughs, and sneezes, etc 1-2 times a week. When she leaks she sometimes wears incontinence pads, and limits liquid intake so she does not have to engage in toilet mapping. When she does not limit liquids she engages in toilet mapping.   Patient denies any modifying or aggravating factors.  Patient denies any gross hematuria, dysuria or suprapubic/flank pain.  Patient denies any fevers, chills, nausea or vomiting.    She reports that she has experienced urinating  in her sleep without her knowing. She is supposed to sleep with oxygen.   She has stopped taking Sanctura for her incontinence.    PMH: Past Medical History:  Diagnosis Date   Acid reflux    Anxiety    Arthritis    Asthma    Bronchitis 09/2015   CVA  (cerebral vascular accident) ([place])    Gross hematuria    H. pylori infection    Heart murmur    Kidney failure    Labile essential hypertension 01/06/2018   Lung abnormality    "damaged lung due to pneumonia"   Neck pain    Nephrolithiasis    Pneumonia    Pneumothorax    Scoliosis    Seasonal allergies    Urinary frequency    Vaginal atrophy     Surgical History: Past Surgical History:  Procedure Laterality Date   ABDOMINAL HYSTERECTOMY     vaginal   APPENDECTOMY     BREAST BIOPSY     COLONOSCOPY N/A 01/02/2020   Procedure: COLONOSCOPY;  Surgeon: Toledo, Benay Pike, MD;  Location: [facility] ENDOSCOPY;  Service: Gastroenterology;  Laterality: N/A;   COLONOSCOPY WITH PROPOFOL N/A 12/29/2018   Procedure: COLONOSCOPY WITH PROPOFOL;  Surgeon: Lucilla Lame, MD;  Location: [facility] ENDOSCOPY;  Service: Endoscopy;  Laterality: N/A;   ESOPHAGOGASTRODUODENOSCOPY N/A 01/02/2020   Procedure: ESOPHAGOGASTRODUODENOSCOPY (EGD);  Surgeon: Toledo, Benay Pike, MD;  Location: [facility] ENDOSCOPY;  Service: Gastroenterology;  Laterality: N/A;   FOOT SURGERY Left    10/2015   NECK SURGERY      Home Medications:  Allergies as of 12/07/2020       Reactions   Morphine And Related Itching   Oxymorphone Itching   Grapefruit Extract Rash   Oxycodone Itching   Propoxyphene Nausea And Vomiting, Nausea Only        Medication List  Accurate as of December 07, 2020 11:59 PM. If you have any questions, ask your nurse or doctor.          STOP taking these medications    loratadine 10 MG tablet Commonly known as: CLARITIN Stopped by: Mylinda Latina, PA-C   predniSONE 10 MG tablet Commonly known as: DELTASONE Stopped by: Mylinda Latina, PA-C       TAKE these medications    albuterol (2.5 MG/3ML) 0.083% nebulizer solution Commonly known as: PROVENTIL Take 2.5 mg by nebulization every 6 (six) hours as needed for wheezing or shortness of breath.   albuterol 108 (90 Base) MCG/ACT  inhaler Commonly known as: VENTOLIN HFA Inhale 2 puffs into the lungs every 6 (six) hours as needed for wheezing or shortness of breath.   amLODipine 5 MG tablet Commonly known as: NORVASC Take 5 mg by mouth daily.   aspirin EC 81 MG tablet Take 81 mg by mouth daily.   atorvastatin 20 MG tablet Commonly known as: LIPITOR Take by mouth.   azelastine 0.1 % nasal spray Commonly known as: ASTELIN Place 1-2 sprays into both nostrils 2 (two) times daily.   azelastine 0.1 % nasal spray Commonly known as: ASTELIN Place into the nose.   Biotin 10000 MCG Tabs Take 10 mg by mouth daily.   budesonide-formoterol 80-4.5 MCG/ACT inhaler Commonly known as: SYMBICORT Inhale 2 puffs into the lungs 2 (two) times daily.   CENTRUM ADULTS PO Take 1 tablet by mouth daily.   cetirizine 10 MG tablet Commonly known as: ZYRTEC Take 10 mg by mouth at bedtime.   Cholecalciferol 25 MCG (1000 UT) tablet Take 1,000 Units by mouth daily.   diclofenac Sodium 1 % Gel Commonly known as: VOLTAREN Apply topically.   gabapentin 800 MG tablet Commonly known as: NEURONTIN Take by mouth. What changed: Another medication with the same name was removed. Continue taking this medication, and follow the directions you see here. Changed by: Larene Beach Fleta Borgeson, PA-C   ipratropium 0.06 % nasal spray Commonly known as: ATROVENT Place into the nose. What changed: Another medication with the same name was removed. Continue taking this medication, and follow the directions you see here. Changed by: Zara Council, PA-C   mirabegron ER 50 MG Tb24 tablet Commonly known as: MYRBETRIQ Take 1 tablet (50 mg total) by mouth daily. Started by: Zara Council, PA-C   montelukast 10 MG tablet Commonly known as: SINGULAIR Take 10 mg by mouth in the morning. What changed: Another medication with the same name was removed. Continue taking this medication, and follow the directions you see here. Changed by: Zara Council, PA-C   naloxone 4 MG/0.1ML Liqd nasal spray kit Commonly known as: NARCAN SMARTSIG:Both Nares What changed: Another medication with the same name was removed. Continue taking this medication, and follow the directions you see here. Changed by: Zara Council, PA-C   nortriptyline 10 MG capsule Commonly known as: PAMELOR Take 10 mg by mouth 2 (two) times daily.   olmesartan 40 MG tablet Commonly known as: BENICAR Take 1 tablet by mouth daily. What changed: Another medication with the same name was removed. Continue taking this medication, and follow the directions you see here. Changed by: Zara Council, PA-C   oxymorphone 10 MG tablet Commonly known as: OPANA   pantoprazole 40 MG tablet Commonly known as: PROTONIX Take 40 mg by mouth daily.   pyridOXINE 100 MG tablet Commonly known as: VITAMIN B-6 Take 100 mg by mouth daily.   rizatriptan 10  MG tablet Commonly known as: MAXALT Take 10 mg by mouth as needed for migraine. May repeat in 2 hours if needed   tiZANidine 4 MG tablet Commonly known as: ZANAFLEX Take by mouth. What changed: Another medication with the same name was removed. Continue taking this medication, and follow the directions you see here. Changed by: Zara Council, PA-C   tiZANidine 4 MG tablet Commonly known as: ZANAFLEX Take by mouth. What changed: Another medication with the same name was removed. Continue taking this medication, and follow the directions you see here. Changed by: Zara Council, PA-C   traZODone 100 MG tablet Commonly known as: DESYREL Take 200 mg by mouth at bedtime.   valACYclovir 1000 MG tablet Commonly known as: VALTREX Take 1,000 mg by mouth daily as needed.   vitamin C 500 MG tablet Commonly known as: ASCORBIC ACID Take 500 mg by mouth daily.   Vitamin E 45 MG Caps Take 90 mg by mouth daily.        Allergies:  Allergies  Allergen Reactions   Morphine And Related Itching   Oxymorphone Itching    Grapefruit Extract Rash   Oxycodone Itching   Propoxyphene Nausea And Vomiting and Nausea Only    Family History: Family History  Problem Relation Age of Onset   Arthritis Mother    Asthma Mother    Hyperlipidemia Mother    Hypertension Mother    Diabetes Sister    Cancer Maternal Aunt        esophagus   Breast cancer Neg Hx    Kidney cancer Neg Hx    Bladder Cancer Neg Hx     Social History:  reports that she has never smoked. She has never used smokeless tobacco. She reports that she does not drink alcohol and does not use drugs.   Physical Exam: BP 128/76   Pulse 91   Ht _0  (1.651 m)   Wt 151 lb (68.5 kg)   BMI 25.13 kg/m   Constitutional:  Well nourished. Alert and oriented, No acute distress. HEENT: [place] AT, mask in place.  Trachea midline Cardiovascular: No clubbing, cyanosis, or edema. Respiratory: Normal respiratory effort, no increased work of breathing. Neurologic: Grossly intact, no focal deficits, moving all 4 extremities. Psychiatric: Normal mood and affect.    Laboratory Data: Sodium 135 - 145 mmol/L 141   Potassium 3.5 - 5.0 mmol/L 3.5   Chloride 98 - 108 mmol/L 104   Carbon Dioxide (CO2) 21 - 30 mmol/L 27   Urea Nitrogen (BUN) 7 - 20 mg/dL 5 Low    Creatinine 0.4 - 1.0 mg/dL 0.8   Glucose 70 - 140 mg/dL 89   Comment: Interpretive Data:  Above is the NONFASTING reference range.   Below are the FASTING reference ranges:  NORMAL:      70-99 mg/dL  PREDIABETES: 100-125 mg/dL  DIABETES:    > 125 mg/dL   Calcium 8.7 - 10.2 mg/dL 9.6   Anion Gap 3 - 12 mmol/L 10   BUN/CREA Ratio 6 - 27 6   Glomerular Filtration Rate (eGFR)  mL/min/1.73sq m 83   Comment: CKD-EPI (2021) does not include patient's race in the calculation of eGFR. Monitoring changes of plasma creatinine and eGFR over time is useful for monitoring kidney function.  This change was made on 05/16/2020.   Interpretive Ranges for eGFR(CKD-EPI 2021):   eGFR:              > 60 mL/min/1.73  sq m - Normal  eGFR:              30 - 59 mL/min/1.73 sq m - Moderately Decreased  eGFR:              15 - 29 mL/min/1.73 sq m - Severely Decreased  eGFR:              < 15 mL/min/1.73 sq m -  Kidney Failure    Note: These eGFR calculations do not apply in acute situations  when eGFR is changing rapidly or in patients on dialysis.   Resulting Agency  [facility]  Specimen Collected: 08/17/20 15:20 Last Resulted: 08/17/20 20:26  Received From: [facility]  Result Received: 08/18/20 08:13   WBC (White Blood Cell Count) 3.2 - 9.8 x10^9/L 8.0   Hemoglobin 12.0 - 15.5 g/dL 12.0   Hematocrit 35.0 - 45.0 % 37.4   Platelets 150 - 450 x10^9/L 409   MCV (Mean Corpuscular Volume) 80 - 98 fL 94   MCH (Mean Corpuscular Hemoglobin) 26.5 - 34.0 pg 30.2   MCHC (Mean Corpuscular Hemoglobin Concentration) 31.4 - 36.0 % 32.1   RBC (Red Blood Cell Count) 3.77 - 5.16 x10^12/L 3.97   RDW-CV (Red Cell Distribution Width) 11.5 - 14.5 % 13.1   MPV (Mean Platelet Volume) 7.2 - 11.7 fL 10.4   Resulting Agency  [facility]  Specimen Collected: 07/26/20 11:01 Last Resulted: 07/26/20 14:31  Received From: [facility]  Result Received: 08/15/20 10:58   Urinalysis Component     Latest Ref Rng & Units 12/07/2020  Specific Gravity, UA     1.005 - 1.030 <1.005 (L)  pH, UA     5.0 - 7.5 6.0  Color, UA     Yellow Yellow  Appearance Ur     Clear Clear  Leukocytes,UA     Negative Negative  Protein,UA     Negative/Trace Negative  Glucose, UA     Negative Negative  Ketones, UA     Negative Negative  RBC, UA     Negative Trace (A)  Bilirubin, UA     Negative Negative  Urobilinogen, Ur     0.2 - 1.0 mg/dL 0.2  Nitrite, UA     Negative Negative  Microscopic Examination      See below:   Component     Latest Ref Rng & Units 12/07/2020          WBC, UA     0 - 5 /hpf None seen  RBC     0 - 2 /hpf 0-2  Epithelial Cells (non renal)      0 - 10 /hpf 0-10  Crystals     N/A Present (A)  Crystal Type     N/A Calcium Oxalate  Bacteria, UA     None seen/Few None seen  I have reviewed the labs.   Pertinent Imaging: Results for orders placed or performed in visit on 12/07/20  CULTURE, URINE COMPREHENSIVE   Specimen: Urine   UR  Result Value Ref Range   Urine Culture, Comprehensive Final report (A)    Organism ID, Bacteria Comment (A)    Organism ID, Bacteria Not applicable   Microscopic Examination   Urine  Result Value Ref Range   WBC, UA None seen 0 - 5 /hpf   RBC 0-2 0 - 2 /hpf   Epithelial Cells (non renal) 0-10 0 - 10 /hpf   Crystals Present (A) N/A   Crystal  Type Calcium Oxalate N/A   Bacteria, UA None seen None seen/Few  Urinalysis, Complete  Result Value Ref Range   Specific Gravity, UA <1.005 (L) 1.005 - 1.030   pH, UA 6.0 5.0 - 7.5   Color, UA Yellow Yellow   Appearance Ur Clear Clear   Leukocytes,UA Negative Negative   Protein,UA Negative Negative/Trace   Glucose, UA Negative Negative   Ketones, UA Negative Negative   RBC, UA Trace (A) Negative   Bilirubin, UA Negative Negative   Urobilinogen, Ur 0.2 0.2 - 1.0 mg/dL   Nitrite, UA Negative Negative   Microscopic Examination See below:   Bladder Scan (Post Void Residual) in office  Result Value Ref Range   Scan Result 63      Assessment & Plan:    1. Incontinence -discussed behavioral therapies, bladder training, bladder control strategies and pelvic floor muscle training -discussed fluid management  -urine sent for culture to rule out indolent infection  -offered medical therapy with a beta-3 adrenergic receptor agonist and the potential side effects of therapy  -she  would like to try the beta-3 adrenergic receptor agonist (Myrbetriq).  Given Myrbetriq 50 mg samples, #28.  I have reviewed with the patient of the side effects of Myrbetriq, such as: elevation in BP, urinary retention and/or HA  2. Nocturnal enuresis -discussed fluid  management -encouraged oxygen use at night   3. History of hematuria -Hematuria work up completed in 2016 & 2019 - NED -No report of gross hematuria  -UA today negative for micro heme -patient to report any gross hematuria in the interim       Return in about 3 weeks (around 12/28/2020) for PVR and OAB questionnaire.  [facility] [address] [place], [place] [address] [phone]  [facility] as a scribe for [facility], PA-C.,have documented all relevant documentation on the behalf of Kenna Seward, PA-C,as directed by  [facility], PA-C while in the presence of Berenice Oehlert, PA-C.  I have reviewed the above documentation for accuracy and completeness, and I agree with the above.    Zara Council, PA-C

## 2020-12-07 ENCOUNTER — Ambulatory Visit (INDEPENDENT_AMBULATORY_CARE_PROVIDER_SITE_OTHER): Payer: 59 | Admitting: Physician Assistant

## 2020-12-07 ENCOUNTER — Encounter: Payer: Self-pay | Admitting: Physician Assistant

## 2020-12-07 ENCOUNTER — Other Ambulatory Visit: Payer: Self-pay

## 2020-12-07 ENCOUNTER — Encounter: Payer: Self-pay | Admitting: Urology

## 2020-12-07 ENCOUNTER — Ambulatory Visit (INDEPENDENT_AMBULATORY_CARE_PROVIDER_SITE_OTHER): Payer: 59 | Admitting: Urology

## 2020-12-07 VITALS — BP 128/76 | HR 91 | Ht 65.0 in | Wt 151.0 lb

## 2020-12-07 DIAGNOSIS — N3281 Overactive bladder: Secondary | ICD-10-CM | POA: Diagnosis not present

## 2020-12-07 DIAGNOSIS — J449 Chronic obstructive pulmonary disease, unspecified: Secondary | ICD-10-CM | POA: Diagnosis not present

## 2020-12-07 DIAGNOSIS — R0602 Shortness of breath: Secondary | ICD-10-CM | POA: Diagnosis not present

## 2020-12-07 DIAGNOSIS — N3944 Nocturnal enuresis: Secondary | ICD-10-CM | POA: Diagnosis not present

## 2020-12-07 DIAGNOSIS — R319 Hematuria, unspecified: Secondary | ICD-10-CM

## 2020-12-07 DIAGNOSIS — J4520 Mild intermittent asthma, uncomplicated: Secondary | ICD-10-CM | POA: Diagnosis not present

## 2020-12-07 DIAGNOSIS — G4710 Hypersomnia, unspecified: Secondary | ICD-10-CM

## 2020-12-07 DIAGNOSIS — G4734 Idiopathic sleep related nonobstructive alveolar hypoventilation: Secondary | ICD-10-CM | POA: Diagnosis not present

## 2020-12-07 DIAGNOSIS — N393 Stress incontinence (female) (male): Secondary | ICD-10-CM

## 2020-12-07 DIAGNOSIS — N3941 Urge incontinence: Secondary | ICD-10-CM

## 2020-12-07 DIAGNOSIS — Z9981 Dependence on supplemental oxygen: Secondary | ICD-10-CM

## 2020-12-07 LAB — BLADDER SCAN AMB NON-IMAGING: Scan Result: 63

## 2020-12-07 MED ORDER — MIRABEGRON ER 50 MG PO TB24: 50.0000 mg | ORAL_TABLET | Freq: Every day | ORAL | 0 refills | Status: DC

## 2020-12-07 MED ORDER — ALBUTEROL SULFATE HFA 108 (90 BASE) MCG/ACT IN AERS: 2.0000 | INHALATION_SPRAY | Freq: Four times a day (QID) | RESPIRATORY_TRACT | 2 refills | Status: AC | PRN

## 2020-12-07 MED ORDER — BUDESONIDE-FORMOTEROL FUMARATE 80-4.5 MCG/ACT IN AERO: 2.0000 | INHALATION_SPRAY | Freq: Two times a day (BID) | RESPIRATORY_TRACT | 3 refills | Status: DC

## 2020-12-07 NOTE — Patient Instructions (Signed)

## 2020-12-07 NOTE — Progress Notes (Signed)
[facility] [place], [place] [address]  Pulmonary Sleep Medicine   Office Visit Note  Patient Name: Rachael Jordan DOB: 01/31/1956 MRN [id number]  Date of Service: 12/07/2020  Complaints/HPI: Pt is here for routine pulmonary follow up. She is back using her inhaler and oxygen at night. Has not needed to use her rescue inhaler. SOB and headaches have improved since getting back on treatments. Had recommended possible echo with cardiology due to SOB but has not had this done and based on improvement may hold off. Discussed bringing this up if SOB begins to increase again, especially since last echo in 2019 showed EF 45-50% and her spiros have been normal. Psg ordered at last visit for further investigation but was never contacted to schedule--will look into this. She has never been a smoker.   ROS  General: (-) fever, (-) chills, (-) night sweats, (-) weakness Skin: (-) rashes, (-) itching,. Eyes: (-) visual changes, (-) redness, (-) itching. Nose and Sinuses: (-) nasal stuffiness or itchiness, (-) postnasal drip, (-) nosebleeds, (-) sinus trouble. Mouth and Throat: (-) sore throat, (-) hoarseness. Neck: (-) swollen glands, (-) enlarged thyroid, (-) neck pain. Respiratory: - cough, (-) bloody sputum, - shortness of breath, - wheezing. Cardiovascular: - ankle swelling, (-) chest pain. Lymphatic: (-) lymph node enlargement. Neurologic: (-) numbness, (-) tingling. Psychiatric: (-) anxiety, (-) depression   Current Medication: Outpatient Encounter Medications as of 12/07/2020  Medication Sig   albuterol (PROVENTIL) (2.5 MG/3ML) 0.083% nebulizer solution Take 2.5 mg by nebulization every 6 (six) hours as needed for wheezing or shortness of breath.   amLODipine (NORVASC) 5 MG tablet Take 5 mg by mouth daily.   aspirin EC 81 MG tablet Take 81 mg by mouth daily.   atorvastatin (LIPITOR) 20 MG tablet Take by mouth.   azelastine (ASTELIN) 0.1 % nasal spray Place  1-2 sprays into both nostrils 2 (two) times daily.    azelastine (ASTELIN) 0.1 % nasal spray Place into the nose.   Biotin 10000 MCG TABS Take 10 mg by mouth daily.    cetirizine (ZYRTEC) 10 MG tablet Take 10 mg by mouth at bedtime.    Cholecalciferol 25 MCG (1000 UT) tablet Take 1,000 Units by mouth daily.    diclofenac Sodium (VOLTAREN) 1 % GEL Apply topically.   gabapentin (NEURONTIN) 800 MG tablet Take by mouth.   ipratropium (ATROVENT) 0.06 % nasal spray Place into the nose.   mirabegron ER (MYRBETRIQ) 50 MG TB24 tablet Take 1 tablet (50 mg total) by mouth daily.   montelukast (SINGULAIR) 10 MG tablet Take 10 mg by mouth in the morning.   Multiple Vitamins-Minerals (CENTRUM ADULTS PO) Take 1 tablet by mouth daily.    naloxone (NARCAN) nasal spray 4 mg/0.1 mL SMARTSIG:Both Nares   nortriptyline (PAMELOR) 10 MG capsule Take 10 mg by mouth 2 (two) times daily.   olmesartan (BENICAR) 40 MG tablet Take 1 tablet by mouth daily.   oxymorphone (OPANA) 10 MG tablet    pantoprazole (PROTONIX) 40 MG tablet Take 40 mg by mouth daily.   pyridOXINE (VITAMIN B-6) 100 MG tablet Take 100 mg by mouth daily.   rizatriptan (MAXALT) 10 MG tablet Take 10 mg by mouth as needed for migraine. May repeat in 2 hours if needed   tiZANidine (ZANAFLEX) 4 MG tablet Take by mouth.   tiZANidine (ZANAFLEX) 4 MG tablet Take by mouth.   traZODone (DESYREL) 100 MG tablet Take 200 mg by mouth at bedtime.  valACYclovir (VALTREX) 1000 MG tablet Take 1,000 mg by mouth daily as needed.   vitamin C (ASCORBIC ACID) 500 MG tablet Take 500 mg by mouth daily.   Vitamin E 45 MG CAPS Take 90 mg by mouth daily.   [DISCONTINUED] albuterol (VENTOLIN HFA) 108 (90 Base) MCG/ACT inhaler Inhale 2 puffs into the lungs every 6 (six) hours as needed for wheezing or shortness of breath.   [DISCONTINUED] budesonide-formoterol (SYMBICORT) 80-4.5 MCG/ACT inhaler Inhale 2 puffs into the lungs 2 (two) times daily.   albuterol (VENTOLIN HFA) 108  (90 Base) MCG/ACT inhaler Inhale 2 puffs into the lungs every 6 (six) hours as needed for wheezing or shortness of breath.   budesonide-formoterol (SYMBICORT) 80-4.5 MCG/ACT inhaler Inhale 2 puffs into the lungs 2 (two) times daily.   [DISCONTINUED] gabapentin (NEURONTIN) 800 MG tablet Take 800 mg by mouth 3 (three) times daily.    [DISCONTINUED] ipratropium (ATROVENT) 0.06 % nasal spray Place into both nostrils.   [DISCONTINUED] loratadine (CLARITIN) 10 MG tablet Take 10 mg by mouth daily. (Patient not taking: Reported on 12/07/2020)   [DISCONTINUED] montelukast (SINGULAIR) 10 MG tablet Take by mouth.   [DISCONTINUED] naloxone (NARCAN) nasal spray 4 mg/0.1 mL Place into both nostrils.   [DISCONTINUED] olmesartan (BENICAR) 40 MG tablet Take 40 mg by mouth daily.   [DISCONTINUED] predniSONE (DELTASONE) 10 MG tablet Take by mouth.   [DISCONTINUED] tiZANidine (ZANAFLEX) 4 MG tablet Take 4 mg by mouth 3 (three) times daily.   Facility-Administered Encounter Medications as of 12/07/2020  Medication   betamethasone acetate-betamethasone sodium phosphate (CELESTONE) injection 3 mg    Surgical History: Past Surgical History:  Procedure Laterality Date   ABDOMINAL HYSTERECTOMY     vaginal   APPENDECTOMY     BREAST BIOPSY     COLONOSCOPY N/A 01/02/2020   Procedure: COLONOSCOPY;  Surgeon: Toledo, Benay Pike, MD;  Location: [facility] ENDOSCOPY;  Service: Gastroenterology;  Laterality: N/A;   COLONOSCOPY WITH PROPOFOL N/A 12/29/2018   Procedure: COLONOSCOPY WITH PROPOFOL;  Surgeon: Lucilla Lame, MD;  Location: [facility] ENDOSCOPY;  Service: Endoscopy;  Laterality: N/A;   ESOPHAGOGASTRODUODENOSCOPY N/A 01/02/2020   Procedure: ESOPHAGOGASTRODUODENOSCOPY (EGD);  Surgeon: Toledo, Benay Pike, MD;  Location: [facility] ENDOSCOPY;  Service: Gastroenterology;  Laterality: N/A;   FOOT SURGERY Left    10/2015   NECK SURGERY      Medical History: Past Medical History:  Diagnosis Date   Acid reflux    Anxiety    Arthritis     Asthma    Bronchitis 09/2015   CVA (cerebral vascular accident) ([place])    Gross hematuria    H. pylori infection    Heart murmur    Kidney failure    Labile essential hypertension 01/06/2018   Lung abnormality    "damaged lung due to pneumonia"   Neck pain    Nephrolithiasis    Pneumonia    Pneumothorax    Scoliosis    Seasonal allergies    Urinary frequency    Vaginal atrophy     Family History: Family History  Problem Relation Age of Onset   Arthritis Mother    Asthma Mother    Hyperlipidemia Mother    Hypertension Mother    Diabetes Sister    Cancer Maternal Aunt        esophagus   Breast cancer Neg Hx    Kidney cancer Neg Hx    Bladder Cancer Neg Hx     Social History: Social History   Socioeconomic History   Marital  status: Legally Separated    Spouse name: Not on file   Number of children: Not on file   Years of education: Not on file   Highest education level: Not on file  Occupational History   Not on file  Tobacco Use   Smoking status: Never   Smokeless tobacco: Never  Vaping Use   Vaping Use: Never used  Substance and Sexual Activity   Alcohol use: No    Alcohol/week: 0.0 standard drinks   Drug use: No   Sexual activity: Not on file  Other Topics Concern   Not on file  Social History Narrative   Not on file   Social Determinants of Health   Financial Resource Strain: Not on file  Food Insecurity: Not on file  Transportation Needs: Not on file  Physical Activity: Not on file  Stress: Not on file  Social Connections: Not on file  Intimate Partner Violence: Not on file    Vital Signs: Blood pressure 120/72, pulse 88, temperature 97.8 F (36.6 C), resp. rate 16, height 5' 5"  (1.651 m), weight 158 lb 9.6 oz (71.9 kg), SpO2 98 %.  Examination: General Appearance: The patient is well-developed, well-nourished, and in no distress. Skin: Gross inspection of skin unremarkable. Head: normocephalic, no gross deformities. Eyes: no gross  deformities noted. ENT: ears appear grossly normal no exudates. Neck: Supple. No thyromegaly. No LAD. Respiratory: Lungs clear to auscultation bilaterally. Cardiovascular: Normal S1 and S2 without murmur or rub. Extremities: No cyanosis. pulses are equal. Neurologic: Alert and oriented. No involuntary movements.  LABS: Recent Results (from the past 2160 hour(s))  Bladder Scan (Post Void Residual) in office     Status: None   Collection Time: 12/07/20  8:52 AM  Result Value Ref Range   Scan Result 63     Radiology: MR FOOT LEFT WO CONTRAST  Result Date: 08/22/2020 CLINICAL DATA:  Foot pain, chronic, Mortons neuroma suspected, xray done EXAM: MRI OF THE LEFT FOOT WITHOUT CONTRAST TECHNIQUE: Multiplanar, multisequence MR imaging of the left foot was performed. No intravenous contrast was administered. COMPARISON:  Left foot radiograph 05/23/2020 FINDINGS: Bones/Joint/Cartilage Postsurgical changes of great toe proximal phalanx and first metatarsal osteotomies with screw fixation. No evidence of hardware complication. There is associated susceptibility artifact. There is no evidence of acute fracture. There is no significant marrow signal alteration. Ligaments Intact Lisfranc ligament.  No evidence of plantar plate tear Muscles and Tendons No significant muscle atrophy. No acute tendon tear in the forefoot. Soft tissues There is ill-defined low T1/T2 signal tissue between first and second metatarsal heads plantar aspect, this measures 9 mm in width (axial T1 image 15). IMPRESSION: Postsurgical changes of great toe proximal phalanx and first metatarsal osteotomies. No acute osseous abnormality. Ill-defined, plantar sided low T1/T2 signal between the first and second metatarsal heads, which likely represents fibrosis/neuroma. Electronically Signed   By: Maurine Simmering   On: 08/22/2020 15:28    No results found.  No results found.    Assessment and Plan: Patient Active Problem List   Diagnosis  Date Noted   Ischemic colitis [facility])    Rectal bleeding    Left lower quadrant abdominal pain    Lower GI bleed    Hypotension    Acute colitis 12/30/2019   Lumbar herniated disc 06/16/2019   Acute nonintractable headache    Multiple falls 02/20/2019   Myoclonic jerking 02/20/2019   COPD (chronic obstructive pulmonary disease) ([place]) 02/20/2019   Seasonal allergies 02/20/2019   Insomnia 02/20/2019  Depression with anxiety 02/20/2019   AKI (acute kidney injury) ([place]) 02/20/2019   Dizziness 02/18/2019   Encounter for screening colonoscopy    Bunion 05/06/2018   Metatarsalgia of right foot 05/06/2018   Age-related osteoporosis without current pathological fracture 03/31/2018   False positive ana 03/31/2018   Mouth dryness 02/18/2018   Screening for osteoporosis 02/18/2018   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/2018   Essential hypertension 01/06/2018   Lacunar infarction ([place]) 01/06/2018   Compression fracture of thoracic vertebra, sequela 07/12/2017   Lumbar compression fracture, sequela 07/12/2017   Nocturnal hypoxia 07/12/2017   Simple chronic bronchitis ([place]) 07/12/2017   Intractable back pain 06/05/2017   Overweight (BMI 25.0-29.9) 04/08/2017   Chronic pain syndrome 04/06/2017   Sensory disturbance 01/08/2017   Pain in the groin, right 10/28/2016   Paresthesia of both hands 12/14/2015   B12 deficiency 10/31/2015   SOB (shortness of breath) 08/29/2015   History of hematuria 07/12/2015   Urinary frequency 07/12/2015   Vaginal atrophy 07/12/2015   HAV (hallux abducto valgus) 07/02/2015   Overlapping toe 07/02/2015   Hammertoe 07/02/2015   LBP (low back pain) 01/17/2015   Trochanteric bursitis of right hip 01/17/2015   PNA (pneumonia) 12/28/2014   DDD (degenerative disc disease), lumbar 08/15/2014   Lumbar radiculopathy 08/15/2014   Spinal stenosis, lumbar region, 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/15/2014   DDD (degenerative disc disease), cervical 08/15/2014    Bilateral occipital neuralgia 08/15/2014   Migraine 08/15/2014   Sacroiliac joint dysfunction 08/15/2014   Back pain, chronic 09/23/2013   Chronic cervical pain 09/23/2013   Arthralgia of temporomandibular joint 08/29/2013   Absolute anemia 08/15/2013   Chronic kidney disease (CKD), stage III (moderate) ([place]) 08/15/2013   Abnormal serum level of alkaline phosphatase 08/15/2013   Blush 08/15/2013   Hematuria, microscopic 08/15/2013   Inflamed nasal mucosa 08/15/2013   Big thyroid 08/15/2013   Disease of thyroid gland 08/15/2013   Positive H. pylori test 08/15/2013   Gastrointestinal ulcer due to Helicobacter pylori 62/05/5595   Other specified bacterial intestinal infections 08/14/2013   Acute kidney injury superimposed on CKD ([place]) 08/09/2013   SIRS (systemic inflammatory response syndrome) ([place]) 08/09/2013   Systemic inflammatory response syndrome (SIRS) ([place]) 08/09/2013    1. Mild intermittent asthma without complication May continue albuterol as needed - albuterol (VENTOLIN HFA) 108 (90 Base) MCG/ACT inhaler; Inhale 2 puffs into the lungs every 6 (six) hours as needed for wheezing or shortness of breath.  Dispense: 18 g; Refill: 2  2. Obstructive chronic bronchitis without exacerbation (HCC) Continue symbicort BID  3. Hypersomnia Will look into scheduling sleep study ordered last visit  4. Nocturnal hypoxia Continue oxygen  5. Oxygen dependent Continue nightly oxygen  6. Shortness of breath - Spirometry with Graph is normal today and SOB improving. Will f/u with cardiology for echo if worsening. - budesonide-formoterol (SYMBICORT) 80-4.5 MCG/ACT inhaler; Inhale 2 puffs into the lungs 2 (two) times daily.  Dispense: 10.2 g; Refill: 3    General Counseling: I have discussed the findings of the evaluation and examination with Florina.  I have also discussed any further diagnostic evaluation thatmay be needed or ordered today. Audra verbalizes understanding of the  findings of todays visit. We also reviewed her medications today and discussed drug interactions and side effects including but not limited excessive drowsiness and altered mental states. We also discussed that there is always a risk not just to her but also people around her. she has been encouraged to call the office with any questions  or concerns that should arise related to todays visit.  Orders Placed This Encounter  Procedures   Spirometry with Graph    Order Specific Question:   Where should this test be performed?    Answer:   Other     Time spent: 30  I have personally obtained a history, examined the patient, evaluated laboratory and imaging results, formulated the assessment and plan and placed orders. This patient was seen by Drema Dallas, PA-C in collaboration with Dr. Devona Konig as a part of collaborative care agreement.     Allyne Gee, MD [facility] Pulmonary and Critical Care Sleep medicine

## 2020-12-08 LAB — URINALYSIS, COMPLETE
Bilirubin, UA: NEGATIVE
Glucose, UA: NEGATIVE
Ketones, UA: NEGATIVE
Leukocytes,UA: NEGATIVE
Nitrite, UA: NEGATIVE
Protein,UA: NEGATIVE
Specific Gravity, UA: <1.005 — ABNORMAL LOW (ref 1.005–1.030)
Urobilinogen, Ur: 0.2 mg/dL (ref 0.2–1.0)
pH, UA: 6 (ref 5.0–7.5)

## 2020-12-08 LAB — MICROSCOPIC EXAMINATION
Bacteria, UA: NONE SEEN
WBC, UA: NONE SEEN /hpf (ref 0–5)

## 2020-12-11 ENCOUNTER — Ambulatory Visit
Admission: RE | Admit: 2020-12-11 | Discharge: 2020-12-11 | Disposition: A | Discharge status: Home / Self Care | Payer: 59 | Source: Ambulatory Visit | Attending: Unknown Physician Specialty | Admitting: Unknown Physician Specialty

## 2020-12-11 ENCOUNTER — Other Ambulatory Visit: Payer: Self-pay

## 2020-12-11 DIAGNOSIS — E041 Nontoxic single thyroid nodule: Secondary | ICD-10-CM

## 2020-12-13 LAB — CULTURE, URINE COMPREHENSIVE

## 2020-12-14 ENCOUNTER — Ambulatory Visit: Payer: 59 | Admitting: Physician Assistant

## 2020-12-20 ENCOUNTER — Telehealth: Payer: Self-pay

## 2020-12-20 NOTE — Telephone Encounter (Signed)
Faxed over referral to clinic where they take the [place]. Referral was faxed over to [facility] to verified fax number [phone].

## 2020-12-21 ENCOUNTER — Other Ambulatory Visit: Payer: Self-pay

## 2020-12-21 ENCOUNTER — Ambulatory Visit (INDEPENDENT_AMBULATORY_CARE_PROVIDER_SITE_OTHER): Payer: 59 | Admitting: Podiatry

## 2020-12-21 DIAGNOSIS — G5782 Other specified mononeuropathies of left lower limb: Secondary | ICD-10-CM | POA: Diagnosis not present

## 2020-12-24 NOTE — Progress Notes (Signed)
Subjective: 65 year old female presents the office today for concerns of continued pain to her left foot.  She states the last injection did help some but she still having the burning into her toes.  No recent injury or trauma.  She states that she just wants to be able to wear her pumps.   Objective: AAO x3, NAD There is no area of pinpoint tenderness.  Majority tenderness is along the left first interspace.  Still tenderness palpation on the area in between the metatarsal heads.  She had some subjective for the last injection, although minimal.  There is no edema, erythema.  There is no area of pinpoint tenderness.  She does get some discomfort submetatarsal 3-5-this is more from compensation as a not able to elicit any tenderness today.  Again not able to identify this today.  There is no pain with MPJ range of motion.  The toes are in rectus position. No pain with calf compression, swelling, warmth, erythema  Assessment: 65 year old female with possible neuroma  Plan: -All treatment options discussed with the patient including all alternatives, risks, complications.  -I did discuss with her that often do not think that things to her feet were coming from her back. -Today a fourth dehydrated sclerosing injection was infiltrated into the first interspace.  Skin skin with alcohol and a mixture of 1 cc of the sclerosing injection was infiltrated into the area of tenderness without complications.  Postinjection care discussed.  Tolerated the procedure well and complications.  Continue shoe modifications and offloading. -We can discuss surgical intervention and we will see how this injection does and possibly 1 more. -Patient encouraged to call the office with any questions, concerns, change in symptoms.   Trula Slade DPM

## 2020-12-27 NOTE — Progress Notes (Signed)
12/28/20 8:45 AM   Rachael Jordan 1956/03/03 [id number]  Referring provider:  Sharyne Peach, MD [place],  [place] [address]  Chief Complaint  Patient presents with   Over Active Bladder    Urological history: High risk hematuria  - non-smoker  -06/2014 hematuria work-up was negative  - CT urogram NED 10/2017  -NED 10/2017 cystoscopy  - 12/2020 renal biopsy showed sever arterial sclerosis and mild interstitial fibrosis and tubular atrophy  - 12/2019 CT showed no GU findings   2. OAB  - contributing factors of age, vaginal atrophy, COPD, antihistamines  - -discontinued PTNS treatments  -Myrbetriq was cost prohibitive  -was managed on [place] -PVR 63 mL   3. Incontinence  - contributing factors of age, COPD, sleep apnea, vaginal atrophy, COPD, antihistamines, chronic opioid use and depression   HPI: Rachael Jordan is a 65 y.o.female who presents today for 3 week follow-up with OAB questionnaire and PVR.   She urinates during the daytime depending on how much liquid she intakes, she urinates 3 times or more during the nighttime. She has a urge to urinate, she has urinary leakage when laughing, coughing, sneezing. She sometimes wears panty liners for protection for her urinary incontinence, and she wear depends. She engages in toilet mapping, and she sometimes watches her liquid intake so she doe not have to urinate as much   She reports her urinary symptoms were better until recently on Saturday she was experiencing more urge and frequency. This has resolved. She noticed improvement on Myrbetriq a few weeks after starting it. During the nighttime she has noticed less frequency.    She denies any modifying or aggravating factors.  Patient denies any gross hematuria, dysuria or suprapubic/flank pain.  Patient denies any fevers, chills, nausea or vomiting.    PMH: Past Medical History:  Diagnosis Date   Acid reflux    Anxiety    Arthritis    Asthma     Bronchitis 09/2015   CVA (cerebral vascular accident) ([place])    Gross hematuria    H. pylori infection    Heart murmur    Kidney failure    Labile essential hypertension 01/06/2018   Lung abnormality    "damaged lung due to pneumonia"   Neck pain    Nephrolithiasis    Pneumonia    Pneumothorax    Scoliosis    Seasonal allergies    Urinary frequency    Vaginal atrophy     Surgical History: Past Surgical History:  Procedure Laterality Date   ABDOMINAL HYSTERECTOMY     vaginal   APPENDECTOMY     BREAST BIOPSY     COLONOSCOPY N/A 01/02/2020   Procedure: COLONOSCOPY;  Surgeon: Toledo, Benay Pike, MD;  Location: [facility] ENDOSCOPY;  Service: Gastroenterology;  Laterality: N/A;   COLONOSCOPY WITH PROPOFOL N/A 12/29/2018   Procedure: COLONOSCOPY WITH PROPOFOL;  Surgeon: Lucilla Lame, MD;  Location: [facility] ENDOSCOPY;  Service: Endoscopy;  Laterality: N/A;   ESOPHAGOGASTRODUODENOSCOPY N/A 01/02/2020   Procedure: ESOPHAGOGASTRODUODENOSCOPY (EGD);  Surgeon: Toledo, Benay Pike, MD;  Location: [facility] ENDOSCOPY;  Service: Gastroenterology;  Laterality: N/A;   FOOT SURGERY Left    10/2015   NECK SURGERY      Home Medications:  Allergies as of 12/28/2020       Reactions   Morphine And Related Itching   Oxymorphone Itching   Grapefruit Extract Rash   Oxycodone Itching   Propoxyphene Nausea And Vomiting, Nausea Only        Medication  List        Accurate as of December 28, 2020  8:45 AM. If you have any questions, ask your nurse or doctor.          albuterol (2.5 MG/3ML) 0.083% nebulizer solution Commonly known as: PROVENTIL Take 2.5 mg by nebulization every 6 (six) hours as needed for wheezing or shortness of breath.   albuterol 108 (90 Base) MCG/ACT inhaler Commonly known as: VENTOLIN HFA Inhale 2 puffs into the lungs every 6 (six) hours as needed for wheezing or shortness of breath.   amLODipine 5 MG tablet Commonly known as: NORVASC Take 5 mg by mouth daily.   aspirin EC  81 MG tablet Take 81 mg by mouth daily.   atorvastatin 20 MG tablet Commonly known as: LIPITOR Take by mouth.   azelastine 0.1 % nasal spray Commonly known as: ASTELIN Place 1-2 sprays into both nostrils 2 (two) times daily.   azelastine 0.1 % nasal spray Commonly known as: ASTELIN Place into the nose.   Biotin 10000 MCG Tabs Take 10 mg by mouth daily.   budesonide-formoterol 80-4.5 MCG/ACT inhaler Commonly known as: SYMBICORT Inhale 2 puffs into the lungs 2 (two) times daily.   CENTRUM ADULTS PO Take 1 tablet by mouth daily.   cetirizine 10 MG tablet Commonly known as: ZYRTEC Take 10 mg by mouth at bedtime.   Cholecalciferol 25 MCG (1000 UT) tablet Take 1,000 Units by mouth daily.   diclofenac Sodium 1 % Gel Commonly known as: VOLTAREN Apply topically.   gabapentin 800 MG tablet Commonly known as: NEURONTIN Take by mouth.   ipratropium 0.06 % nasal spray Commonly known as: ATROVENT Place into the nose.   mirabegron ER 50 MG Tb24 tablet Commonly known as: MYRBETRIQ Take 1 tablet (50 mg total) by mouth daily.   montelukast 10 MG tablet Commonly known as: SINGULAIR Take 10 mg by mouth in the morning.   naloxone 4 MG/0.1ML Liqd nasal spray kit Commonly known as: NARCAN SMARTSIG:Both Nares   nortriptyline 10 MG capsule Commonly known as: PAMELOR Take 10 mg by mouth 2 (two) times daily.   olmesartan 40 MG tablet Commonly known as: BENICAR Take 1 tablet by mouth daily.   oxymorphone 10 MG tablet Commonly known as: OPANA   pantoprazole 40 MG tablet Commonly known as: PROTONIX Take 40 mg by mouth daily.   pyridOXINE 100 MG tablet Commonly known as: VITAMIN B-6 Take 100 mg by mouth daily.   rizatriptan 10 MG tablet Commonly known as: MAXALT Take 10 mg by mouth as needed for migraine. May repeat in 2 hours if needed   tiZANidine 4 MG tablet Commonly known as: ZANAFLEX Take by mouth.   tiZANidine 4 MG tablet Commonly known as: ZANAFLEX Take by  mouth.   traZODone 100 MG tablet Commonly known as: DESYREL Take 200 mg by mouth at bedtime.   valACYclovir 1000 MG tablet Commonly known as: VALTREX Take 1,000 mg by mouth daily as needed.   vitamin C 500 MG tablet Commonly known as: ASCORBIC ACID Take 500 mg by mouth daily.   Vitamin E 45 MG Caps Take 90 mg by mouth daily.        Allergies:  Allergies  Allergen Reactions   Morphine And Related Itching   Oxymorphone Itching   Grapefruit Extract Rash   Oxycodone Itching   Propoxyphene Nausea And Vomiting and Nausea Only    Family History: Family History  Problem Relation Age of Onset   Arthritis Mother  Asthma Mother    Hyperlipidemia Mother    Hypertension Mother    Diabetes Sister    Cancer Maternal Aunt        esophagus   Breast cancer Neg Hx    Kidney cancer Neg Hx    Bladder Cancer Neg Hx     Social History:  reports that she has never smoked. She has never used smokeless tobacco. She reports that she does not drink alcohol and does not use drugs.   Physical Exam: BP (!) 151/89   Pulse (!) 109   Ht 5' 5"  (1.651 m)   Wt 153 lb (69.4 kg)   BMI 25.46 kg/m   Constitutional:  Alert and oriented, No acute distress. HEENT: [place] AT, moist mucus membranes.  Trachea midline Cardiovascular: No clubbing, cyanosis, or edema. Respiratory: Normal respiratory effort, no increased work of breathing. GI: Abdomen is soft, nontender, nondistended, no abdominal masses GU: No CVA tenderness Neurologic: Grossly intact, no focal deficits, moving all 4 extremities. Psychiatric: Normal mood and affect.  Laboratory Data:  Lab Results  Component Value Date   CREATININE 1.00 01/04/2020    Contains abnormal data Basic Metabolic Panel (BMP) Order: [id number]  Ref Range & Units 4 mo ago  Sodium 135 - 145 mmol/L 141   Potassium 3.5 - 5.0 mmol/L 3.5   Chloride 98 - 108 mmol/L 104   Carbon Dioxide (CO2) 21 - 30 mmol/L 27   Urea Nitrogen (BUN) 7 - 20 mg/dL 5 Low     Creatinine 0.4 - 1.0 mg/dL 0.8   Glucose 70 - 140 mg/dL 89   Comment: Interpretive Data:  Above is the NONFASTING reference range.   Below are the FASTING reference ranges:  NORMAL:      70-99 mg/dL  PREDIABETES: 100-125 mg/dL  DIABETES:    > 125 mg/dL   Calcium 8.7 - 10.2 mg/dL 9.6   Anion Gap 3 - 12 mmol/L 10   BUN/CREA Ratio 6 - 27 6   Glomerular Filtration Rate (eGFR)  mL/min/1.73sq m 83   Comment: CKD-EPI (2021) does not include patient's race in the calculation of eGFR. Monitoring changes of plasma creatinine and eGFR over time is useful for monitoring kidney function.  This change was made on 05/16/2020.   Interpretive Ranges for eGFR(CKD-EPI 2021):   eGFR:              > 60 mL/min/1.73 sq m - Normal  eGFR:              30 - 59 mL/min/1.73 sq m - Moderately Decreased  eGFR:              15 - 29 mL/min/1.73 sq m - Severely Decreased  eGFR:              < 15 mL/min/1.73 sq m -  Kidney Failure      Lab Results  Component Value Date   HGBA1C 5.5 02/19/2019    Urinalysis   Pertinent Imaging: Results for orders placed or performed in visit on 12/28/20  Bladder Scan (Post Void Residual) in office  Result Value Ref Range   Scan Result 0      Assessment & Plan:   Incontinence  - discussed behavioral therapies, bladder training, bladder control strategies and pelvic floor muscle training - Voiding adequately with PVR 0 mL - Has seen improvement on Myrbetriq  - Continue myrbetriq; prescription sent today to [place] and samples gvn today   Nocturnal enuresis  -  Continue oxygen use at night  - Continue management of fluids   History of hematuria  -Hematuria work up completed in 2016 & 2019 - NED - No report of gross hematuria   Follow-up in 3 months for symptoms recheck   [facility] [address] [place], [place] [address] [phone]  I,Kailey Littlejohn,acting as a scribe for [facility] [employer] [facility], PA-C.,have  documented all relevant documentation on the behalf of Friend, PA-C,as directed by  [facility], PA-C while in the presence of Marka Treloar, PA-C.  I have reviewed the above documentation for accuracy and completeness, and I agree with the above.    Zara Council, PA-C

## 2020-12-28 ENCOUNTER — Other Ambulatory Visit: Payer: Self-pay

## 2020-12-28 ENCOUNTER — Ambulatory Visit (INDEPENDENT_AMBULATORY_CARE_PROVIDER_SITE_OTHER): Payer: 59 | Admitting: Urology

## 2020-12-28 ENCOUNTER — Encounter: Payer: Self-pay | Admitting: Urology

## 2020-12-28 VITALS — BP 151/89 | HR 109 | Ht 65.0 in | Wt 153.0 lb

## 2020-12-28 DIAGNOSIS — N3944 Nocturnal enuresis: Secondary | ICD-10-CM | POA: Diagnosis not present

## 2020-12-28 DIAGNOSIS — N3281 Overactive bladder: Secondary | ICD-10-CM | POA: Diagnosis not present

## 2020-12-28 LAB — BLADDER SCAN AMB NON-IMAGING: Scan Result: 0

## 2020-12-28 MED ORDER — MIRABEGRON ER 50 MG PO TB24: 50.0000 mg | ORAL_TABLET | Freq: Every day | ORAL | 3 refills | Status: DC

## 2021-01-02 ENCOUNTER — Telehealth: Payer: Self-pay | Admitting: *Deleted

## 2021-01-02 NOTE — Telephone Encounter (Signed)
Prior auth sent 01/02/2021 for Myrbetriq

## 2021-01-03 ENCOUNTER — Other Ambulatory Visit: Payer: Self-pay

## 2021-01-03 ENCOUNTER — Emergency Department
Admission: EM | Admit: 2021-01-03 | Discharge: 2021-01-03 | Disposition: A | Discharge status: Home / Self Care | Payer: 59 | Attending: Emergency Medicine | Admitting: Emergency Medicine

## 2021-01-03 DIAGNOSIS — J45909 Unspecified asthma, uncomplicated: Secondary | ICD-10-CM | POA: Diagnosis not present

## 2021-01-03 DIAGNOSIS — G8929 Other chronic pain: Secondary | ICD-10-CM

## 2021-01-03 DIAGNOSIS — I129 Hypertensive chronic kidney disease with stage 1 through stage 4 chronic kidney disease, or unspecified chronic kidney disease: Secondary | ICD-10-CM | POA: Diagnosis not present

## 2021-01-03 DIAGNOSIS — Z7951 Long term (current) use of inhaled steroids: Secondary | ICD-10-CM | POA: Diagnosis not present

## 2021-01-03 DIAGNOSIS — M5441 Lumbago with sciatica, right side: Secondary | ICD-10-CM | POA: Insufficient documentation

## 2021-01-03 DIAGNOSIS — N183 Chronic kidney disease, stage 3 unspecified: Secondary | ICD-10-CM | POA: Insufficient documentation

## 2021-01-03 DIAGNOSIS — J449 Chronic obstructive pulmonary disease, unspecified: Secondary | ICD-10-CM | POA: Insufficient documentation

## 2021-01-03 DIAGNOSIS — Z7982 Long term (current) use of aspirin: Secondary | ICD-10-CM | POA: Diagnosis not present

## 2021-01-03 DIAGNOSIS — Z79899 Other long term (current) drug therapy: Secondary | ICD-10-CM | POA: Insufficient documentation

## 2021-01-03 DIAGNOSIS — M545 Low back pain, unspecified: Secondary | ICD-10-CM | POA: Diagnosis present

## 2021-01-03 MED ORDER — KETOROLAC TROMETHAMINE 60 MG/2ML IM SOLN
60.0000 mg | Freq: Once | INTRAMUSCULAR | Status: AC
  Administered 2021-01-03: 60 mg via INTRAMUSCULAR
  Filled 2021-01-03: qty 2

## 2021-01-03 MED ORDER — LIDOCAINE 5 % EX PTCH: 1.0000 | MEDICATED_PATCH | Freq: Two times a day (BID) | CUTANEOUS | 0 refills | Status: AC | PRN

## 2021-01-03 MED ORDER — PREDNISONE 20 MG PO TABS: 40.0000 mg | ORAL_TABLET | Freq: Every day | ORAL | 0 refills | Status: AC

## 2021-01-03 MED ORDER — ORPHENADRINE CITRATE 30 MG/ML IJ SOLN
60.0000 mg | Freq: Two times a day (BID) | INTRAMUSCULAR | Status: DC
  Administered 2021-01-03: 60 mg via INTRAMUSCULAR
  Filled 2021-01-03 (×3): qty 2

## 2021-01-03 NOTE — ED Triage Notes (Signed)
Pt to ED for lower back pain for multiple days radiating into right leg. Ambulatory to triage Denies injuries  MSE [place] PA in triage

## 2021-01-03 NOTE — Discharge Instructions (Addendum)
Take the prescription steroid as directed.  Follow-up with Dr. Sharlet Salina for ongoing pain.

## 2021-01-03 NOTE — ED Provider Notes (Addendum)
[facility] Emergency Department Provider Note ____________________________________________  Time seen: 1201  I have reviewed the triage vital signs and the nursing notes.  HISTORY  Chief Complaint  Back Pain   HPI Rachael Jordan is a 65 y.o. female presents to the ED for acute exacerbation of chronic right-sided low back pain.  She reports several days of low back pain with radiation to the right leg consistent with her previous episode of sciatica.  She denies any recent injury, trauma, fall.  She also denies any bladder or bowel incontinence, foot drop, or saddle anesthesia.  She reports home medications have not manage her pain as per usual.  Past Medical History:  Diagnosis Date   Acid reflux    Anxiety    Arthritis    Asthma    Bronchitis 09/2015   CVA (cerebral vascular accident) ([place])    Gross hematuria    H. pylori infection    Heart murmur    Kidney failure    Labile essential hypertension 01/06/2018   Lung abnormality    "damaged lung due to pneumonia"   Neck pain    Nephrolithiasis    Pneumonia    Pneumothorax    Scoliosis    Seasonal allergies    Urinary frequency    Vaginal atrophy     Patient Active Problem List   Diagnosis Date Noted   Ischemic colitis ([place])    Rectal bleeding    Left lower quadrant abdominal pain    Lower GI bleed    Hypotension    Acute colitis 12/30/2019   Lumbar herniated disc 06/16/2019   Acute nonintractable headache    Multiple falls 02/20/2019   Myoclonic jerking 02/20/2019   COPD (chronic obstructive pulmonary disease) ([place]) 02/20/2019   Seasonal allergies 02/20/2019   Insomnia 02/20/2019   Depression with anxiety 02/20/2019   AKI (acute kidney injury) ([place]) 02/20/2019   Dizziness 02/18/2019   Encounter for screening colonoscopy    Bunion 05/06/2018   Metatarsalgia of right foot 05/06/2018   Age-related osteoporosis without current pathological fracture 03/31/2018   False positive ana  03/31/2018   Mouth dryness 02/18/2018   Screening for osteoporosis 02/18/2018   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/2018   Essential hypertension 01/06/2018   Lacunar infarction ([place]) 01/06/2018   Compression fracture of thoracic vertebra, sequela 07/12/2017   Lumbar compression fracture, sequela 07/12/2017   Nocturnal hypoxia 07/12/2017   Simple chronic bronchitis ([place]) 07/12/2017   Intractable back pain 06/05/2017   Overweight (BMI 25.0-29.9) 04/08/2017   Chronic pain syndrome 04/06/2017   Sensory disturbance 01/08/2017   Pain in the groin, right 10/28/2016   Paresthesia of both hands 12/14/2015   B12 deficiency 10/31/2015   SOB (shortness of breath) 08/29/2015   History of hematuria 07/12/2015   Urinary frequency 07/12/2015   Vaginal atrophy 07/12/2015   HAV (hallux abducto valgus) 07/02/2015   Overlapping toe 07/02/2015   Hammertoe 07/02/2015   LBP (low back pain) 01/17/2015   Trochanteric bursitis of right hip 01/17/2015   PNA (pneumonia) 12/28/2014   DDD (degenerative disc disease), lumbar 08/15/2014   Lumbar radiculopathy 08/15/2014   Spinal stenosis, lumbar region, 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/15/2014   DDD (degenerative disc disease), cervical 08/15/2014   Bilateral occipital neuralgia 08/15/2014   Migraine 08/15/2014   Sacroiliac joint dysfunction 08/15/2014   Back pain, chronic 09/23/2013   Chronic cervical pain 09/23/2013   Arthralgia of temporomandibular joint 08/29/2013   Absolute anemia 08/15/2013   Chronic kidney disease (CKD), stage III (moderate) (  [place]) 08/15/2013   Abnormal serum level of alkaline phosphatase 08/15/2013   Blush 08/15/2013   Hematuria, microscopic 08/15/2013   Inflamed nasal mucosa 08/15/2013   Big thyroid 08/15/2013   Disease of thyroid gland 08/15/2013   Positive H. pylori test 08/15/2013   Gastrointestinal ulcer due to Helicobacter pylori 22/63/3354   Other specified bacterial intestinal infections 08/14/2013   Acute  kidney injury superimposed on CKD ([place]) 08/09/2013   SIRS (systemic inflammatory response syndrome) ([place]) 08/09/2013   Systemic inflammatory response syndrome (SIRS) ([place]) 08/09/2013    Past Surgical History:  Procedure Laterality Date   ABDOMINAL HYSTERECTOMY     vaginal   APPENDECTOMY     BREAST BIOPSY     COLONOSCOPY N/A 01/02/2020   Procedure: COLONOSCOPY;  Surgeon: Toledo, Benay Pike, MD;  Location: [facility] ENDOSCOPY;  Service: Gastroenterology;  Laterality: N/A;   COLONOSCOPY WITH PROPOFOL N/A 12/29/2018   Procedure: COLONOSCOPY WITH PROPOFOL;  Surgeon: Lucilla Lame, MD;  [facility] ENDOSCOPY;  Service: Endoscopy;  Laterality: N/A;   ESOPHAGOGASTRODUODENOSCOPY N/A 01/02/2020   Procedure: ESOPHAGOGASTRODUODENOSCOPY (EGD);  Surgeon: Toledo, Benay Pike, MD;  Location: [facility] ENDOSCOPY;  Service: Gastroenterology;  Laterality: N/A;   FOOT SURGERY Left    10/2015   NECK SURGERY      Prior to Admission medications   Medication Sig Start Date End Date Taking? Authorizing Provider  lidocaine (LIDODERM) 5 % Place 1 patch onto the skin every 12 (twelve) hours as needed for up to 10 days. Remove & Discard patch after 12 hours of wear each day. 01/03/21 01/13/21 Yes Quamere Mussell, Dannielle Karvonen, PA-C  predniSONE (DELTASONE) 20 MG tablet Take 2 tablets (40 mg total) by mouth daily with breakfast for 5 days. 01/03/21 01/08/21 Yes Lailie Smead, Dannielle Karvonen, PA-C  albuterol (PROVENTIL) (2.5 MG/3ML) 0.083% nebulizer solution Take 2.5 mg by nebulization every 6 (six) hours as needed for wheezing or shortness of breath.    [provider]  albuterol (VENTOLIN HFA) 108 (90 Base) MCG/ACT inhaler Inhale 2 puffs into the lungs every 6 (six) hours as needed for wheezing or shortness of breath. 12/07/20   McDonough, Si Gaul, PA-C  amLODipine (NORVASC) 5 MG tablet Take 5 mg by mouth daily. 04/28/20   [provider]  aspirin EC 81 MG tablet Take 81 mg by mouth daily.    [provider]   atorvastatin (LIPITOR) 20 MG tablet Take by mouth. 08/17/20 08/17/21  [provider]  azelastine (ASTELIN) 0.1 % nasal spray Place 1-2 sprays into both nostrils 2 (two) times daily.     [provider]  azelastine (ASTELIN) 0.1 % nasal spray Place into the nose. 05/25/20   [provider]  Biotin 10000 MCG TABS Take 10 mg by mouth daily.     [provider]  budesonide-formoterol (SYMBICORT) 80-4.5 MCG/ACT inhaler Inhale 2 puffs into the lungs 2 (two) times daily. 12/07/20   McDonough, Si Gaul, PA-C  cetirizine (ZYRTEC) 10 MG tablet Take 10 mg by mouth at bedtime.     [provider]  Cholecalciferol 25 MCG (1000 UT) tablet Take 1,000 Units by mouth daily.     [provider]  diclofenac Sodium (VOLTAREN) 1 % GEL Apply topically. 09/04/20   [provider]  gabapentin (NEURONTIN) 800 MG tablet Take by mouth. 08/29/20   [provider]  ipratropium (ATROVENT) 0.06 % nasal spray Place into the nose. 09/26/20   [provider]  mirabegron ER (MYRBETRIQ) 50 MG TB24 tablet Take 1 tablet (50  mg total) by mouth daily. 12/28/20   Zara Council A, PA-C  montelukast (SINGULAIR) 10 MG tablet Take 10 mg by mouth in the morning.    [provider]  Multiple Vitamins-Minerals (CENTRUM ADULTS PO) Take 1 tablet by mouth daily.     [provider]  naloxone [facility]) nasal spray 4 mg/0.1 mL SMARTSIG:Both Nares 09/04/20   [provider]  nortriptyline (PAMELOR) 10 MG capsule Take 10 mg by mouth 2 (two) times daily. 12/29/19   [provider]  olmesartan (BENICAR) 40 MG tablet Take 1 tablet by mouth daily. 08/03/20 08/03/21  [provider]  oxymorphone (OPANA) 10 MG tablet  07/12/20   [provider]  pantoprazole (PROTONIX) 40 MG tablet Take 40 mg by mouth daily.    [provider]  pyridOXINE (VITAMIN B-6) 100 MG tablet Take 100 mg by mouth daily.    [provider]   rizatriptan (MAXALT) 10 MG tablet Take 10 mg by mouth as needed for migraine. May repeat in 2 hours if needed    [provider]  tiZANidine (ZANAFLEX) 4 MG tablet Take by mouth. 05/25/20   [provider]  tiZANidine (ZANAFLEX) 4 MG tablet Take by mouth. 09/26/20   [provider]  traZODone (DESYREL) 100 MG tablet Take 200 mg by mouth at bedtime.     [provider]  valACYclovir (VALTREX) 1000 MG tablet Take 1,000 mg by mouth daily as needed.    [provider]  vitamin C (ASCORBIC ACID) 500 MG tablet Take 500 mg by mouth daily.    [provider]  Vitamin E 45 MG CAPS Take 90 mg by mouth daily.    [provider]    Allergies Morphine and related, Oxymorphone, Grapefruit extract, Oxycodone, and Propoxyphene  Family History  Problem Relation Age of Onset   Arthritis Mother    Asthma Mother    Hyperlipidemia Mother    Hypertension Mother    Diabetes Sister    Cancer Maternal Aunt        esophagus   Breast cancer Neg Hx    Kidney cancer Neg Hx    Bladder Cancer Neg Hx     Social History Social History   Tobacco Use   Smoking status: Never   Smokeless tobacco: Never  Vaping Use   Vaping Use: Never used  Substance Use Topics   Alcohol use: No    Alcohol/week: 0.0 standard drinks   Drug use: No    Review of Systems  Constitutional: Negative for fever. Cardiovascular: Negative for chest pain. Respiratory: Negative for shortness of breath. Gastrointestinal: Negative for abdominal pain, vomiting and diarrhea. Genitourinary: Negative for dysuria. Musculoskeletal: Positive for back pain. Skin: Negative for rash. Neurological: Negative for headaches, focal weakness or numbness. ____________________________________________  PHYSICAL EXAM:  VITAL SIGNS: ED Triage Vitals  Enc Vitals Group     BP 01/03/21 1154 (!) 141/79     Pulse Rate 01/03/21 1154 100     Resp 01/03/21 1154 20     Temp 01/03/21 1154 98.3  F (36.8 C)     Temp Source 01/03/21 1154 Oral     SpO2 01/03/21 1154 97 %     Weight 01/03/21 1154 154 lb 5.2 oz (70 kg)     Height 01/03/21 1154 5' 5"  (1.651 m)     Head Circumference --      Peak Flow --      Pain Score 01/03/21 1153 10     Pain  Loc --      Pain Edu? --      Excl. in [place]? --     Constitutional: Alert and oriented. Well appearing and in no distress. Head: Normocephalic and atraumatic. Eyes: Conjunctivae are normal. Normal extraocular movements Neck: Supple. No thyromegaly. Cardiovascular: Normal rate, regular rhythm. Normal distal pulses. Respiratory: Normal respiratory effort. No wheezes/rales/rhonchi. Gastrointestinal: Soft and nontender. No distention. Musculoskeletal: Normal spinal alignment without midline tenderness, spasm, vomiting, or step-off.  Patient with normal transition from sit to stand.  .  Nontender with normal range of motion in all extremities.  Neurologic: Cranial nerves II to XII grossly intact.  Normal LE DTRs bilaterally.  Normal gait without ataxia. Normal speech and language. No gross focal neurologic deficits are appreciated.Negative seated straight leg raise bilaterally Skin:  Skin is warm, dry and intact. No rash noted. Psychiatric: Mood and affect are normal. Patient exhibits appropriate insight and judgment. ____________________________________________    {LABS (pertinent positives/negatives)  ____________________________________________  {EKG  ____________________________________________   RADIOLOGY Official radiology report(s): No results found. ____________________________________________  PROCEDURES  Toradol 60 mg IM Norflex 60 mg IM  Procedures ____________________________________________   INITIAL IMPRESSION / ASSESSMENT AND PLAN / ED COURSE  As part of my medical decision making, I reviewed the following data within the electronic MEDICAL RECORD NUMBER Notes from prior ED visits and [place] Controlled Substance Database    DDX: lumbar strain, sciatica, hip pain   Patient ED evaluation of acute on chronic flare of her chronic low back pain with some referral down the right leg.  Exam is overall reassuring and benign at this time.  No signs of any muscle deficit.  Patient reports significant provement of her symptoms after ED medication administration.  She is stable for discharge at this time.  She will follow with primary provider or return to the ED for worsening symptoms.  Rachael Jordan was evaluated in Emergency Department on 01/03/2021 for the symptoms described in the history of present illness. She was evaluated in the context of the global COVID-19 pandemic, which necessitated consideration that the patient might be at risk for infection with the SARS-CoV-2 virus that causes COVID-19. Institutional protocols and algorithms that pertain to the evaluation of patients at risk for COVID-19 are in a state of rapid change based on information released by regulatory bodies including the CDC and federal and state organizations. These policies and algorithms were followed during the patient's care in the ED. ____________________________________________  FINAL CLINICAL IMPRESSION(S) / ED DIAGNOSES  Final diagnoses:  Chronic right-sided low back pain with right-sided sciatica      Carmie End, Dannielle Karvonen, PA-C 01/10/21 1433    Shannara Winbush, Dannielle Karvonen, PA-C 01/25/21 2243    Duffy Bruce, MD 01/29/21 1614

## 2021-01-18 ENCOUNTER — Ambulatory Visit: Payer: 59 | Admitting: Podiatry

## 2021-01-18 ENCOUNTER — Ambulatory Visit: Payer: 59 | Admitting: Physician Assistant

## 2021-01-25 ENCOUNTER — Telehealth: Payer: Self-pay

## 2021-01-25 NOTE — Telephone Encounter (Signed)
Patients order was sent over to [facility] with [facility] on October 27th, 2022. Patient has not heard back and I have attempted to contact the staff there [facility]) with no return call.

## 2021-02-02 ENCOUNTER — Other Ambulatory Visit
Admission: RE | Admit: 2021-02-02 | Discharge: 2021-02-02 | Disposition: A | Discharge status: Home / Self Care | Payer: 59 | Source: Ambulatory Visit | Attending: Physician Assistant | Admitting: Physician Assistant

## 2021-02-02 ENCOUNTER — Ambulatory Visit: Payer: 59 | Admitting: Podiatry

## 2021-02-02 ENCOUNTER — Other Ambulatory Visit: Payer: Self-pay

## 2021-02-02 DIAGNOSIS — Z01812 Encounter for preprocedural laboratory examination: Secondary | ICD-10-CM | POA: Diagnosis present

## 2021-02-02 DIAGNOSIS — Z20822 Contact with and (suspected) exposure to covid-19: Secondary | ICD-10-CM | POA: Diagnosis not present

## 2021-02-03 LAB — SARS CORONAVIRUS 2 (TAT 6-24 HRS): SARS Coronavirus 2: NEGATIVE

## 2021-02-06 ENCOUNTER — Ambulatory Visit: Payer: 59 | Attending: Neurology

## 2021-02-06 ENCOUNTER — Telehealth: Payer: Self-pay | Admitting: Urology

## 2021-02-06 DIAGNOSIS — G4733 Obstructive sleep apnea (adult) (pediatric): Secondary | ICD-10-CM | POA: Insufficient documentation

## 2021-02-06 NOTE — Telephone Encounter (Signed)
Her insurance has denied Myrbetriq.

## 2021-02-06 NOTE — Telephone Encounter (Signed)
Copied from secure chat:Marland Kitchen  The pts insurance denied her for Myrbetriq and she wants to know if there is anything else you could give to her in the place of it. She said it really helped her.  Logan Bores is the only other OAB medication in that family of medications and it is a brand new drug, so I do not think it will be covered.    Ask [name] to call her insurance company and ask them to send her the guidelines they used to deny the Myrbetriq.  When she receives this from the insurance, there may be other steps we can take to get her medication approved.

## 2021-02-06 NOTE — Telephone Encounter (Signed)
Rachael Jordan called in saying she cannot pick up her Myrbetriq from the pharmacy because they keep telling her it needs to be authorized. She would like a callback today to let her know when it's done or if it will be done today so that she may pick up her meds.

## 2021-02-07 ENCOUNTER — Ambulatory Visit: Payer: 59

## 2021-02-12 NOTE — Telephone Encounter (Signed)
Received from [employer]:  Deniedon October 31 This request has not been approved. Based on the information submitted for review, you did not meet our guideline rules for the requested drug. In order for your request to be approved, your provider would need to show that you have met the guideline rules below. The details below are written in medical language. If you have questions, please contact your provider. In some cases, the requested medication or alternatives offered may have additional approval requirements. Our guideline named MIRABEGRON (Myrbetriq) requires that you have ONE of the following diagnoses: 1. Overactive bladder (OAB) with symptoms of urge urinary incontinence, urinary urgency, and urinary frequency 2. Neurogenic detrusor overactivity (neurogenic bladder: a type of bladder condition)In addition, the following rule(s) must be met for approval: A. You have a documented trial and failure of, or contraindication (harmful for) to adequate treatment with at least THREE (3) antimuscarinic agents (darifenacin, solifenacin, oxybutynin, tolterodine, or trospium). Your provider told us that this medication is being requested for the treatment of overactive bladder (OAB) with symptoms of urge urinary incontinence, urinary urgency, and urinary frequency. We do not have information showing that you met the conditions listed above. This is why your request is denied. Please follow up with your provider. A written notification letter will follow with additional details.

## 2021-02-14 ENCOUNTER — Other Ambulatory Visit: Payer: Self-pay

## 2021-02-14 ENCOUNTER — Telehealth: Payer: Self-pay

## 2021-02-14 NOTE — Telephone Encounter (Signed)
Spoke with Stacie at [employer] who stated they have the patients sleep study results and will be faxing the results over to [place] since the patient has an appt tomorrow morning. Stacie confirmed she understood and will be faxing over ASAP. [phone].

## 2021-02-15 ENCOUNTER — Ambulatory Visit: Payer: 59 | Admitting: Physician Assistant

## 2021-02-15 ENCOUNTER — Encounter: Payer: Self-pay | Admitting: Physician Assistant

## 2021-02-15 DIAGNOSIS — J449 Chronic obstructive pulmonary disease, unspecified: Secondary | ICD-10-CM

## 2021-02-15 DIAGNOSIS — J4520 Mild intermittent asthma, uncomplicated: Secondary | ICD-10-CM | POA: Diagnosis not present

## 2021-02-15 DIAGNOSIS — R0602 Shortness of breath: Secondary | ICD-10-CM

## 2021-02-15 DIAGNOSIS — G4734 Idiopathic sleep related nonobstructive alveolar hypoventilation: Secondary | ICD-10-CM

## 2021-02-15 DIAGNOSIS — J4489 Other specified chronic obstructive pulmonary disease: Secondary | ICD-10-CM

## 2021-02-15 DIAGNOSIS — G4733 Obstructive sleep apnea (adult) (pediatric): Secondary | ICD-10-CM | POA: Diagnosis not present

## 2021-02-15 NOTE — Patient Instructions (Signed)
Living With Sleep Apnea Sleep apnea is a condition in which breathing pauses or becomes shallow during sleep. Sleep apnea is most commonly caused by a collapsed or blocked airway. People with sleep apnea usually snore loudly. They may have times when they gasp and stop breathing for 10 seconds or more during sleep. This may happen many times during the night. The breaks in breathing also interrupt the deep sleep that you need to feel rested. Even if you do not completely wake up from the gaps in breathing, your sleep may not be restful and you feel tired during the day. You may also have a headache in the morning and low energy during the day, and you may feel anxious or depressed. How can sleep apnea affect me? Sleep apnea increases your chances of extreme tiredness during the day (daytime fatigue). It can also increase your risk for health conditions, such as: Heart attack. Stroke. Obesity. Type 2 diabetes. Heart failure. Irregular heartbeat. High blood pressure. If you have daytime fatigue as a result of sleep apnea, you may be more likely to: Perform poorly at school or work. Fall asleep while driving. Have difficulty with attention. Develop depression or anxiety. Have sexual dysfunction. What actions can I take to manage sleep apnea? Sleep apnea treatment  If you were given a device to open your airway while you sleep, use it only as told by your health care provider. You may be given: An oral appliance. This is a custom-made mouthpiece that shifts your lower jaw forward. A continuous positive airway pressure (CPAP) device. This device blows air through a mask when you breathe out (exhale). A nasal expiratory positive airway pressure (EPAP) device. This device has valves that you put into each nostril. A bi-level positive airway pressure (BIPAP) device. This device blows air through a mask when you breathe in (inhale) and breathe out (exhale). You may need surgery if other treatments  do not work for you. Sleep habits Go to sleep and wake up at the same time every day. This helps set your internal clock (circadian rhythm) for sleeping. If you stay up later than usual, such as on weekends, try to get up in the morning within 2 hours of your normal wake time. Try to get at least 7-9 hours of sleep each night. Stop using a computer, tablet, and mobile phone a few hours before bedtime. Do not take long naps during the day. If you nap, limit it to 30 minutes. Have a relaxing bedtime routine. Reading or listening to music may relax you and help you sleep. Use your bedroom only for sleep. Keep your television and computer out of your bedroom. Keep your bedroom cool, dark, and quiet. Use a supportive mattress and pillows. Follow your health care provider's instructions for other changes to sleep habits. Nutrition Do not eat heavy meals in the evening. Do not have caffeine in the later part of the day. The effects of caffeine can last for more than 5 hours. Follow your health care provider's or dietitian's instructions for any diet changes. Lifestyle   Do not drink alcohol before bedtime. Alcohol can cause you to fall asleep at first, but then it can cause you to wake up in the middle of the night and have trouble getting back to sleep. Do not use any products that contain nicotine or tobacco. These products include cigarettes, chewing tobacco, and vaping devices, such as e-cigarettes. If you need help quitting, ask your health care provider. Medicines Take over-the-counter and   prescription medicines only as told by your health care provider. Do not use over-the-counter sleep medicine. You can become dependent on this medicine, and it can make sleep apnea worse. Do not use medicines, such as sedatives and narcotics, unless told by your health care provider. Activity Exercise on most days, but avoid exercising in the evening. Exercising near bedtime can interfere with  sleeping. If possible, spend time outside every day. Natural light helps regulate your circadian rhythm. General information Lose weight if you need to, and maintain a healthy weight. Keep all follow-up visits. This is important. If you are having surgery, make sure to tell your health care provider that you have sleep apnea. You may need to bring your device with you. Where to find more information Learn more about sleep apnea and daytime fatigue from: American Sleep Association: sleepassociation.org National Sleep Foundation: sleepfoundation.org National Heart, Lung, and Blood Institute: nhlbi.nih.gov Summary Sleep apnea is a condition in which breathing pauses or becomes shallow during sleep. Sleep apnea can cause daytime fatigue and other serious health conditions. You may need to wear a device while sleeping to help keep your airway open. If you are having surgery, make sure to tell your health care provider that you have sleep apnea. You may need to bring your device with you. Making changes to sleep habits, diet, lifestyle, and activity can help you manage sleep apnea. This information is not intended to replace advice given to you by your health care provider. Make sure you discuss any questions you have with your health care provider. Document Revised: 10/11/2020 Document Reviewed: 02/11/2020 Elsevier Patient Education  2022 Elsevier Inc.  

## 2021-02-15 NOTE — Progress Notes (Signed)
[facility] [place], [place] [address]  Pulmonary Sleep Medicine   Office Visit Note  Patient Name: Rachael Jordan DOB: 12/07/1955 MRN [id number]  Date of Service: 02/15/2021  Complaints/HPI:  Pt is here for routine pulmonary visit to review Psg results. Psg did show mild OSA, more common in REM, with lowest desat to 89% however test was done with supplement oxygen at 3L. She is on pain medications that can contribute to apneas and hypoxemia. Patient is fatigued and does still report spells of SOB at times. Her lung function tests have been normal and patient reports she has not seen cardiology in a long time. Will go ahead and order updated echo and refer back to cardiology if needed. She has never been a smoker.  ROS  General: (-) fever, (-) chills, (-) night sweats, (-) weakness Skin: (-) rashes, (-) itching,. Eyes: (-) visual changes, (-) redness, (-) itching. Nose and Sinuses: (-) nasal stuffiness or itchiness, (-) postnasal drip, (-) nosebleeds, (-) sinus trouble. Mouth and Throat: (-) sore throat, (-) hoarseness. Neck: (-) swollen glands, (-) enlarged thyroid, (-) neck pain. Respiratory: - cough, (-) bloody sputum, + shortness of breath, - wheezing. Cardiovascular: - ankle swelling, (-) chest pain. Lymphatic: (-) lymph node enlargement. Neurologic: (-) numbness, (-) tingling. Psychiatric: (-) anxiety, (-) depression   Current Medication: Outpatient Encounter Medications as of 02/15/2021  Medication Sig   albuterol (PROVENTIL) (2.5 MG/3ML) 0.083% nebulizer solution Take 2.5 mg by nebulization every 6 (six) hours as needed for wheezing or shortness of breath.   albuterol (VENTOLIN HFA) 108 (90 Base) MCG/ACT inhaler Inhale 2 puffs into the lungs every 6 (six) hours as needed for wheezing or shortness of breath.   amLODipine (NORVASC) 5 MG tablet Take 5 mg by mouth daily.   aspirin EC 81 MG tablet Take 81 mg by mouth daily.   azelastine (ASTELIN) 0.1  % nasal spray Place 1-2 sprays into both nostrils 2 (two) times daily.    azelastine (ASTELIN) 0.1 % nasal spray Place into the nose.   Biotin 10000 MCG TABS Take 10 mg by mouth daily.    budesonide-formoterol (SYMBICORT) 80-4.5 MCG/ACT inhaler Inhale 2 puffs into the lungs 2 (two) times daily.   cetirizine (ZYRTEC) 10 MG tablet Take 10 mg by mouth at bedtime.    Cholecalciferol 25 MCG (1000 UT) tablet Take 1,000 Units by mouth daily.    diclofenac Sodium (VOLTAREN) 1 % GEL Apply topically.   gabapentin (NEURONTIN) 800 MG tablet Take by mouth.   ipratropium (ATROVENT) 0.06 % nasal spray Place into the nose.   montelukast (SINGULAIR) 10 MG tablet Take 10 mg by mouth in the morning.   Multiple Vitamins-Minerals (CENTRUM ADULTS PO) Take 1 tablet by mouth daily.    naloxone (NARCAN) nasal spray 4 mg/0.1 mL SMARTSIG:Both Nares   nortriptyline (PAMELOR) 10 MG capsule Take 10 mg by mouth 2 (two) times daily.   olmesartan (BENICAR) 40 MG tablet Take 1 tablet by mouth daily.   oxymorphone (OPANA) 10 MG tablet    pantoprazole (PROTONIX) 40 MG tablet Take 40 mg by mouth daily.   pyridOXINE (VITAMIN B-6) 100 MG tablet Take 100 mg by mouth daily.   rizatriptan (MAXALT) 10 MG tablet Take 10 mg by mouth as needed for migraine. May repeat in 2 hours if needed   tiZANidine (ZANAFLEX) 4 MG tablet Take by mouth.   tiZANidine (ZANAFLEX) 4 MG tablet Take by mouth.   traZODone (DESYREL) 100 MG tablet Take 200  mg by mouth at bedtime.    valACYclovir (VALTREX) 1000 MG tablet Take 1,000 mg by mouth daily as needed.   vitamin C (ASCORBIC ACID) 500 MG tablet Take 500 mg by mouth daily.   Vitamin E 45 MG CAPS Take 90 mg by mouth daily.   [DISCONTINUED] atorvastatin (LIPITOR) 20 MG tablet Take by mouth. (Patient not taking: Reported on 02/15/2021)   [DISCONTINUED] mirabegron ER (MYRBETRIQ) 50 MG TB24 tablet Take 1 tablet (50 mg total) by mouth daily. (Patient not taking: Reported on 02/15/2021)   Facility-Administered  Encounter Medications as of 02/15/2021  Medication   betamethasone acetate-betamethasone sodium phosphate (CELESTONE) injection 3 mg    Surgical History: Past Surgical History:  Procedure Laterality Date   ABDOMINAL HYSTERECTOMY     vaginal   APPENDECTOMY     BREAST BIOPSY     COLONOSCOPY N/A 01/02/2020   Procedure: COLONOSCOPY;  Surgeon: Toledo, Benay Pike, MD;  Location: [facility] ENDOSCOPY;  Service: Gastroenterology;  Laterality: N/A;   COLONOSCOPY WITH PROPOFOL N/A 12/29/2018   Procedure: COLONOSCOPY WITH PROPOFOL;  Surgeon: Lucilla Lame, MD;  Location: [facility] ENDOSCOPY;  Service: Endoscopy;  Laterality: N/A;   ESOPHAGOGASTRODUODENOSCOPY N/A 01/02/2020   Procedure: ESOPHAGOGASTRODUODENOSCOPY (EGD);  Surgeon: Toledo, Benay Pike, MD;  Location: [facility] ENDOSCOPY;  Service: Gastroenterology;  Laterality: N/A;   FOOT SURGERY Left    10/2015   NECK SURGERY      Medical History: Past Medical History:  Diagnosis Date   Acid reflux    Anxiety    Arthritis    Asthma    Bronchitis 09/2015   CVA (cerebral vascular accident) ([place])    Gross hematuria    H. pylori infection    Heart murmur    Kidney failure    Labile essential hypertension 01/06/2018   Lung abnormality    "damaged lung due to pneumonia"   Neck pain    Nephrolithiasis    Pneumonia    Pneumothorax    Scoliosis    Seasonal allergies    Urinary frequency    Vaginal atrophy     Family History: Family History  Problem Relation Age of Onset   Arthritis Mother    Asthma Mother    Hyperlipidemia Mother    Hypertension Mother    Diabetes Sister    Cancer Maternal Aunt        esophagus   Breast cancer Neg Hx    Kidney cancer Neg Hx    Bladder Cancer Neg Hx     Social History: Social History   Socioeconomic History   Marital status: Legally Separated    Spouse name: Not on file   Number of children: Not on file   Years of education: Not on file   Highest education level: Not on file  Occupational History   Not  on file  Tobacco Use   Smoking status: Never   Smokeless tobacco: Never  Vaping Use   Vaping Use: Never used  Substance and Sexual Activity   Alcohol use: No    Alcohol/week: 0.0 standard drinks   Drug use: No   Sexual activity: Not on file  Other Topics Concern   Not on file  Social History Narrative   Not on file   Social Determinants of Health   Financial Resource Strain: Not on file  Food Insecurity: Not on file  Transportation Needs: Not on file  Physical Activity: Not on file  Stress: Not on file  Social Connections: Not on file  Intimate Partner Violence: Not  on file    Vital Signs: Blood pressure 130/78, pulse 95, temperature 97.8 F (36.6 C), resp. rate 16, height 5' 5.5" (1.664 m), weight 165 lb (74.8 kg), SpO2 96 %.  Examination: General Appearance: The patient is well-developed, well-nourished, and in no distress. Skin: Gross inspection of skin unremarkable. Head: normocephalic, no gross deformities. Eyes: no gross deformities noted. ENT: ears appear grossly normal no exudates. Neck: Supple. No thyromegaly. No LAD. Respiratory: Lungs clear to auscultation bilaterally. Cardiovascular: Normal S1 and S2 without murmur or rub. Extremities: No cyanosis. pulses are equal. Neurologic: Alert and oriented. No involuntary movements.  LABS: Recent Results (from the past 2160 hour(s))  Bladder Scan (Post Void Residual) in office     Status: None   Collection Time: 12/07/20  8:52 AM  Result Value Ref Range   Scan Result 63   Urinalysis, Complete     Status: Abnormal   Collection Time: 12/07/20  8:54 AM  Result Value Ref Range   Specific Gravity, UA <1.005 (L) 1.005 - 1.030   pH, UA 6.0 5.0 - 7.5   Color, UA Yellow Yellow   Appearance Ur Clear Clear   Leukocytes,UA Negative Negative   Protein,UA Negative Negative/Trace   Glucose, UA Negative Negative   Ketones, UA Negative Negative   RBC, UA Trace (A) Negative   Bilirubin, UA Negative Negative    Urobilinogen, Ur 0.2 0.2 - 1.0 mg/dL   Nitrite, UA Negative Negative   Microscopic Examination See below:   Microscopic Examination     Status: Abnormal   Collection Time: 12/07/20  8:54 AM   Urine  Result Value Ref Range   WBC, UA None seen 0 - 5 /hpf   RBC 0-2 0 - 2 /hpf   Epithelial Cells (non renal) 0-10 0 - 10 /hpf   Crystals Present (A) N/A   Crystal Type Calcium Oxalate N/A   Bacteria, UA None seen None seen/Few  CULTURE, URINE COMPREHENSIVE     Status: Abnormal   Collection Time: 12/07/20  9:41 AM   Specimen: Urine   UR  Result Value Ref Range   Urine Culture, Comprehensive Final report (A)    Organism ID, Bacteria Comment (A)     Comment: Beta hemolytic Streptococcus, group B 2,000 Colonies/mL Penicillin and ampicillin are drugs of choice for treatment of beta-hemolytic streptococcal infections. Susceptibility testing of penicillins and other beta-lactam agents approved by the FDA for treatment of beta-hemolytic streptococcal infections need not be performed routinely because nonsusceptible isolates are extremely rare in any beta-hemolytic streptococcus and have not been reported for Streptococcus pyogenes (group A). (CLSI)    Organism ID, Bacteria Not applicable   Bladder Scan (Post Void Residual) in office     Status: None   Collection Time: 12/28/20  8:34 AM  Result Value Ref Range   Scan Result 0   SARS CORONAVIRUS 2 (TAT 6-24 HRS) Nasopharyngeal Nasopharyngeal Swab     Status: None   Collection Time: 02/02/21 10:16 AM   Specimen: Nasopharyngeal Swab  Result Value Ref Range   SARS Coronavirus 2 NEGATIVE NEGATIVE    Comment: (NOTE) SARS-CoV-2 target nucleic acids are NOT DETECTED.  The SARS-CoV-2 RNA is generally detectable in upper and lower respiratory specimens during the acute phase of infection. Negative results do not preclude SARS-CoV-2 infection, do not rule out co-infections with other pathogens, and should not be used as the sole basis for  treatment or other patient management decisions. Negative results must be combined with clinical observations, patient history,  and epidemiological information. The expected result is Negative.  Fact Sheet for Patients: SugarRoll.be  Fact Sheet for Healthcare Providers: https://www.woods-mathews.com/  This test is not yet approved or cleared by the [place] FDA and  has been authorized for detection and/or diagnosis of SARS-CoV-2 by FDA under an Emergency Use Authorization (EUA). This EUA will remain  in effect (meaning this test can be used) for the duration of the COVID-19 declaration under Se ction 564(b)(1) of the Act, 21 U.S.C. section 360bbb-3(b)(1), unless the authorization is terminated or revoked sooner.  Performed at [facility], [place] [address], [place], [place] [address]     Radiology: No results found.  SLEEP STUDY DOCUMENTS  Result Date: 02/15/2021 Ordered by an unspecified provider.   SLEEP STUDY DOCUMENTS  Result Date: 02/15/2021 Ordered by an unspecified provider.     Assessment and Plan: Patient Active Problem List   Diagnosis Date Noted   Ischemic colitis ([place])    Rectal bleeding    Left lower quadrant abdominal pain    Lower GI bleed    Hypotension    Acute colitis 12/30/2019   Lumbar herniated disc 06/16/2019   Acute nonintractable headache    Multiple falls 02/20/2019   Myoclonic jerking 02/20/2019   COPD (chronic obstructive pulmonary disease) ([place]) 02/20/2019   Seasonal allergies 02/20/2019   Insomnia 02/20/2019   Depression with anxiety 02/20/2019   AKI (acute kidney injury) ([place]) 02/20/2019   Dizziness 02/18/2019   Encounter for screening colonoscopy    Bunion 05/06/2018   Metatarsalgia of right foot 05/06/2018   Age-related osteoporosis without current pathological fracture 03/31/2018   False positive ana 03/31/2018   Mouth dryness 02/18/2018   Screening for osteoporosis  02/18/2018   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/2018   Essential hypertension 01/06/2018   Lacunar infarction ([place]) 01/06/2018   Compression fracture of thoracic vertebra, sequela 07/12/2017   Lumbar compression fracture, sequela 07/12/2017   Nocturnal hypoxia 07/12/2017   Simple chronic bronchitis ([place]) 07/12/2017   Intractable back pain 06/05/2017   Overweight (BMI 25.0-29.9) 04/08/2017   Chronic pain syndrome 04/06/2017   Sensory disturbance 01/08/2017   Pain in the groin, right 10/28/2016   Paresthesia of both hands 12/14/2015   B12 deficiency 10/31/2015   SOB (shortness of breath) 08/29/2015   History of hematuria 07/12/2015   Urinary frequency 07/12/2015   Vaginal atrophy 07/12/2015   HAV (hallux abducto valgus) 07/02/2015   Overlapping toe 07/02/2015   Hammertoe 07/02/2015   LBP (low back pain) 01/17/2015   Trochanteric bursitis of right hip 01/17/2015   PNA (pneumonia) 12/28/2014   DDD (degenerative disc disease), lumbar 08/15/2014   Lumbar radiculopathy 08/15/2014   Spinal stenosis, lumbar region, 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/15/2014   DDD (degenerative disc disease), cervical 08/15/2014   Bilateral occipital neuralgia 08/15/2014   Migraine 08/15/2014   Sacroiliac joint dysfunction 08/15/2014   Back pain, chronic 09/23/2013   Chronic cervical pain 09/23/2013   Arthralgia of temporomandibular joint 08/29/2013   Absolute anemia 08/15/2013   Chronic kidney disease (CKD), stage III (moderate) ([place]) 08/15/2013   Abnormal serum level of alkaline phosphatase 08/15/2013   Blush 08/15/2013   Hematuria, microscopic 08/15/2013   Inflamed nasal mucosa 08/15/2013   Big thyroid 08/15/2013   Disease of thyroid gland 08/15/2013   Positive H. pylori test 08/15/2013   Gastrointestinal ulcer due to Helicobacter pylori 16/03/930   Other specified bacterial intestinal infections 08/14/2013   Acute kidney injury superimposed on CKD ([place]) 08/09/2013   SIRS (systemic  inflammatory  response syndrome) ([place]) 08/09/2013   Systemic inflammatory response syndrome (SIRS) ([place]) 08/09/2013    1. OSA (obstructive sleep apnea) Will order titration study to determine what pressures settings needed and whether same oxygen requirements with cpap or not - Cpap titration; Future  2. Nocturnal hypoxia Continue oxygen at night  3. Obstructive chronic bronchitis without exacerbation ([place]) Continue inhalers as prescribed and as indicated  4. Mild intermittent asthma without complication Continue inhalers as prescribed and as indicated  5. Shortness of breath Will order updated echo, may be partially due to medications  - ECHOCARDIOGRAM COMPLETE; Future   General Counseling: I have discussed the findings of the evaluation and examination with Rachael Jordan.  I have also discussed any further diagnostic evaluation thatmay be needed or ordered today. Rachael Jordan verbalizes understanding of the findings of todays visit. We also reviewed her medications today and discussed drug interactions and side effects including but not limited excessive drowsiness and altered mental states. We also discussed that there is always a risk not just to her but also people around her. she has been encouraged to call the office with any questions or concerns that should arise related to todays visit.  Orders Placed This Encounter  Procedures   ECHOCARDIOGRAM COMPLETE    Standing Status:   Future    Standing Expiration Date:   02/15/2022    Order Specific Question:   Where should this test be performed    Answer:   External    Order Specific Question:   Perflutren DEFINITY (image enhancing agent) should be administered unless hypersensitivity or allergy exist    Answer:   Do NOT administer Perflutren    Order Specific Question:   Reason for no Perflutren    Answer:   Other    Order Specific Question:   Specify other    Answer:   n/a    Order Specific Question:   Reason for exam-Echo     Answer:   Dyspnea  R06.00   Cpap titration    Standing Status:   Future    Standing Expiration Date:   02/15/2022    Order Specific Question:   Where should this test be performed:    Answer:   [facility]     Time spent: 64  I have personally obtained a history, examined the patient, evaluated laboratory and imaging results, formulated the assessment and plan and placed orders. This patient was seen by Drema Dallas, PA-C in collaboration with Dr. Devona Konig as a part of collaborative care agreement.     Allyne Gee, MD [facility] Pulmonary and Critical Care Sleep medicine

## 2021-02-20 ENCOUNTER — Encounter: Payer: Self-pay | Admitting: Podiatry

## 2021-02-20 ENCOUNTER — Other Ambulatory Visit: Payer: Self-pay

## 2021-02-20 ENCOUNTER — Ambulatory Visit (INDEPENDENT_AMBULATORY_CARE_PROVIDER_SITE_OTHER): Payer: 59 | Admitting: Podiatry

## 2021-02-20 DIAGNOSIS — G5782 Other specified mononeuropathies of left lower limb: Secondary | ICD-10-CM | POA: Diagnosis not present

## 2021-02-20 NOTE — Progress Notes (Addendum)
Subjective: 65 year old female presents the office today for concerns of continued pain to her left foot.  She feels that her symptoms are more still coming from the nerve pain between the first and second toe area.  She does get some generalized achiness to her foot as well.  She is having difficulty wearing a heeled shoe.  No increase in swelling.  No recent injuries.  She has no other concerns today.  Objective: AAO x3, NAD There is no area of pinpoint tenderness.  Still have the majority discomfort on the first interspace.  Today I am actually able to palpate a small neuroma in the interspace.  There is no discomfort MPJ range of motion or any areas of pinpoint tenderness.  There is no significant edema there is no erythema.  There is no open lesions.  Flexor, extensor tendons appear to be intact.  MMT 5/5. No pain with calf compression, swelling, warmth, erythema  Assessment: 65 year old female with neuroma  Plan: -All treatment options discussed with the patient including all alternatives, risks, complications.  -We discussed both conservative as well as surgical treatment options.  She was ordered from surgery.  She was to try 1 more injection before going to the OR.  Discussed risks of this but understands and wishes to proceed.  Today the skin was prepped with alcohol and mixture of 1 cc of dehydrated sclerosing alcohol was infiltrated into the first interspace on the area of tenderness on the neuroma.  She tolerated the injection well and complications.  Postinjection care discussed.  Continue offloading.  Discussed to stay off of her foot for the next couple days  Trula Slade DPM

## 2021-02-21 ENCOUNTER — Telehealth: Payer: Self-pay

## 2021-02-21 NOTE — Telephone Encounter (Signed)
[facility] sent confirmation that they received cpap titration study request. They will reach out to the patient to schedule her.

## 2021-02-26 ENCOUNTER — Telehealth: Payer: Self-pay

## 2021-02-26 NOTE — Telephone Encounter (Signed)
Patient is scheduled a CPAP titration for 03/21/2021.

## 2021-02-28 ENCOUNTER — Other Ambulatory Visit: Payer: Self-pay

## 2021-02-28 ENCOUNTER — Ambulatory Visit: Payer: 59

## 2021-02-28 DIAGNOSIS — R0602 Shortness of breath: Secondary | ICD-10-CM

## 2021-03-15 ENCOUNTER — Ambulatory Visit: Payer: 59 | Admitting: Physician Assistant

## 2021-03-16 ENCOUNTER — Other Ambulatory Visit: Payer: Self-pay

## 2021-03-16 ENCOUNTER — Other Ambulatory Visit
Admission: RE | Admit: 2021-03-16 | Discharge: 2021-03-16 | Disposition: A | Discharge status: Home / Self Care | Payer: 59 | Source: Ambulatory Visit | Attending: Internal Medicine | Admitting: Internal Medicine

## 2021-03-16 DIAGNOSIS — Z20822 Contact with and (suspected) exposure to covid-19: Secondary | ICD-10-CM | POA: Insufficient documentation

## 2021-03-16 DIAGNOSIS — Z1152 Encounter for screening for COVID-19: Secondary | ICD-10-CM

## 2021-03-16 DIAGNOSIS — Z01812 Encounter for preprocedural laboratory examination: Secondary | ICD-10-CM | POA: Insufficient documentation

## 2021-03-17 LAB — SARS CORONAVIRUS 2 (TAT 6-24 HRS): SARS Coronavirus 2: NEGATIVE

## 2021-03-20 ENCOUNTER — Telehealth: Payer: Self-pay

## 2021-03-20 NOTE — Telephone Encounter (Signed)
Patient called stating she is scheduled for ss 03/21/21, but her insurance has changed. Gave her telephone # for feeling great-Toni

## 2021-03-22 ENCOUNTER — Ambulatory Visit: Payer: 59 | Admitting: Physician Assistant

## 2021-03-27 DIAGNOSIS — Z23 Encounter for immunization: Secondary | ICD-10-CM | POA: Diagnosis not present

## 2021-03-27 DIAGNOSIS — G894 Chronic pain syndrome: Secondary | ICD-10-CM | POA: Diagnosis not present

## 2021-03-29 DIAGNOSIS — J449 Chronic obstructive pulmonary disease, unspecified: Secondary | ICD-10-CM | POA: Diagnosis not present

## 2021-03-29 NOTE — Progress Notes (Signed)
03/30/21 9:26 AM   Rachael Jordan Sep 10, 1955 [id number]  Referring provider:  Sharyne Peach, MD [address] [place],  [place] [address]  Chief Complaint  Patient presents with   Over Active Bladder   Urological history: High risk hematuria  - non-smoker  -06/2014 hematuria work-up was negative  - CT urogram NED 10/2017  -NED 10/2017 cystoscopy  - 12/2020 renal biopsy showed sever arterial sclerosis and mild interstitial fibrosis and tubular atrophy  - 12/2019 CT showed no GU findings  -No reports of gross heme -UA negative for micro heme in September 2022  2. OAB  - contributing factors of age, vaginal atrophy, COPD, antihistamines  - discontinued PTNS treatments  -Myrbetriq was cost prohibitive  -failed trospium, darifenacin and oxybutynin -PVR 22 mL   3. Incontinence  - contributing factors of age, COPD, sleep apnea, vaginal atrophy, COPD, antihistamines, chronic opioid use and depression   HPI: Rachael Jordan is a 66 y.o.female who presents today for 3 month follow-up with OAB questionnaire and PVR.   At her visit in October 2022, she had started Myrbetriq 50 mg daily and noted improvement in her urinary symptoms.  Unfortunately, her insurance would not cover the medication as she needed to have had a documented trial and failure or contraindication to at least 3 of the antimuscarinic agents (darifenacin, solifenacin, oxybutynin, tolterodine, or trospium).  She is experiencing 8 or more daytime urinations, 3 or more nocturia, she has a strong urge to urinate.  She is having incontinence with stress 3 or more times weekly.  She wears panty liners and absorbent pads on occasions.  She engages in fluid intake and toilet mapping on occasion.  She has been without her Myrbetriq 50 mg for a few weeks as her previous insurance would not cover the medication.  She has now switched insurance so we will go ahead and resubmit the prescription.  She states the Myrbetriq 50 mg  daily has been very effective for her and so she would like to continue the medication  PVR 22 mL  PMH: Past Medical History:  Diagnosis Date   Acid reflux    Anxiety    Arthritis    Asthma    Bronchitis 09/2015   CVA (cerebral vascular accident) ([place])    Gross hematuria    H. pylori infection    Heart murmur    Kidney failure    Labile essential hypertension 01/06/2018   Lung abnormality    "damaged lung due to pneumonia"   Neck pain    Nephrolithiasis    Pneumonia    Pneumothorax    Scoliosis    Seasonal allergies    Urinary frequency    Vaginal atrophy     Surgical History: Past Surgical History:  Procedure Laterality Date   ABDOMINAL HYSTERECTOMY     vaginal   APPENDECTOMY     BREAST BIOPSY     COLONOSCOPY N/A 01/02/2020   Procedure: COLONOSCOPY;  Surgeon: Toledo, Benay Pike, MD;  Location: [facility] ENDOSCOPY;  Service: Gastroenterology;  Laterality: N/A;   COLONOSCOPY WITH PROPOFOL N/A 12/29/2018   Procedure: COLONOSCOPY WITH PROPOFOL;  Surgeon: Lucilla Lame, MD;  Location: [facility] ENDOSCOPY;  Service: Endoscopy;  Laterality: N/A;   ESOPHAGOGASTRODUODENOSCOPY N/A 01/02/2020   Procedure: ESOPHAGOGASTRODUODENOSCOPY (EGD);  Surgeon: Toledo, Benay Pike, MD;  Location: [facility] ENDOSCOPY;  Service: Gastroenterology;  Laterality: N/A;   FOOT SURGERY Left    10/2015   NECK SURGERY      Home Medications:  Allergies as of 03/30/2021  Reactions   Morphine And Related Itching   Oxymorphone Itching   Grapefruit Extract Rash   Oxycodone Itching   Propoxyphene Nausea And Vomiting, Nausea Only        Medication List        Accurate as of March 30, 2021  9:26 AM. If you have any questions, ask your nurse or doctor.          albuterol (2.5 MG/3ML) 0.083% nebulizer solution Commonly known as: PROVENTIL Take 2.5 mg by nebulization every 6 (six) hours as needed for wheezing or shortness of breath.   albuterol 108 (90 Base) MCG/ACT inhaler Commonly known as:  VENTOLIN HFA Inhale 2 puffs into the lungs every 6 (six) hours as needed for wheezing or shortness of breath.   amLODipine 5 MG tablet Commonly known as: NORVASC Take 5 mg by mouth daily.   aspirin EC 81 MG tablet Take 81 mg by mouth daily.   azelastine 0.1 % nasal spray Commonly known as: ASTELIN Place 1-2 sprays into both nostrils 2 (two) times daily.   azelastine 0.1 % nasal spray Commonly known as: ASTELIN Place into the nose.   Biotin 10000 MCG Tabs Take 10 mg by mouth daily.   budesonide-formoterol 80-4.5 MCG/ACT inhaler Commonly known as: SYMBICORT Inhale 2 puffs into the lungs 2 (two) times daily.   CENTRUM ADULTS PO Take 1 tablet by mouth daily.   cetirizine 10 MG tablet Commonly known as: ZYRTEC Take 10 mg by mouth at bedtime.   Cholecalciferol 25 MCG (1000 UT) tablet Take 1,000 Units by mouth daily.   diclofenac Sodium 1 % Gel Commonly known as: VOLTAREN Apply topically.   gabapentin 800 MG tablet Commonly known as: NEURONTIN Take by mouth.   ipratropium 0.06 % nasal spray Commonly known as: ATROVENT Place into the nose.   montelukast 10 MG tablet Commonly known as: SINGULAIR Take 10 mg by mouth in the morning.   naloxone 4 MG/0.1ML Liqd nasal spray kit Commonly known as: NARCAN SMARTSIG:Both Nares   nortriptyline 10 MG capsule Commonly known as: PAMELOR Take 10 mg by mouth 2 (two) times daily.   olmesartan 40 MG tablet Commonly known as: BENICAR Take 1 tablet by mouth daily.   oxymorphone 10 MG tablet Commonly known as: OPANA   pantoprazole 40 MG tablet Commonly known as: PROTONIX Take 40 mg by mouth daily.   pyridOXINE 100 MG tablet Commonly known as: VITAMIN B-6 Take 100 mg by mouth daily.   rizatriptan 10 MG tablet Commonly known as: MAXALT Take 10 mg by mouth as needed for migraine. May repeat in 2 hours if needed   tiZANidine 4 MG tablet Commonly known as: ZANAFLEX Take by mouth.   tiZANidine 4 MG tablet Commonly  known as: ZANAFLEX Take by mouth.   traZODone 100 MG tablet Commonly known as: DESYREL Take 200 mg by mouth at bedtime.   valACYclovir 1000 MG tablet Commonly known as: VALTREX Take 1,000 mg by mouth daily as needed.   vitamin C 500 MG tablet Commonly known as: ASCORBIC ACID Take 500 mg by mouth daily.   Vitamin E 45 MG Caps Take 90 mg by mouth daily.        Allergies:  Allergies  Allergen Reactions   Morphine And Related Itching   Oxymorphone Itching   Grapefruit Extract Rash   Oxycodone Itching   Propoxyphene Nausea And Vomiting and Nausea Only    Family History: Family History  Problem Relation Age of Onset   Arthritis Mother  Asthma Mother    Hyperlipidemia Mother    Hypertension Mother    Diabetes Sister    Cancer Maternal Aunt        esophagus   Breast cancer Neg Hx    Kidney cancer Neg Hx    Bladder Cancer Neg Hx     Social History:  reports that she has never smoked. She has never used smokeless tobacco. She reports that she does not drink alcohol and does not use drugs.   Physical Exam: BP 139/86    Pulse (!) 109    Ht 5' 5"  (1.651 m)    Wt 162 lb (73.5 kg)    BMI 26.96 kg/m   Constitutional:  Well nourished. Alert and oriented, No acute distress. HEENT: [place] AT, mask in place.  Trachea midline Cardiovascular: No clubbing, cyanosis, or edema. Respiratory: Normal respiratory effort, no increased work of breathing. Neurologic: Grossly intact, no focal deficits, moving all 4 extremities. Psychiatric: Normal mood and affect.    Laboratory Data: N/A  Pertinent Imaging: Results for orders placed or performed in visit on 03/30/21  Bladder Scan (Post Void Residual) in office  Result Value Ref Range   Scan Result 93m      Assessment & Plan:    Incontinence  -At goal of Myrbetriq 50 mg daily, but unfortunately previous insurance would not cover the medication -I have resubmitted the Myrbetriq 50 mg daily as she has acquired new insurance and  hopefully will be able to get approval as she has failed anticholinergics  2. Nocturnal enuresis  - Continue oxygen use at night  - Continue management of fluids   3. History of hematuria  -Hematuria work up completed in 2016 & 2019 - NED - No report of gross hematuria   [facility]  [place] [place]  [phone] SZara Council PA-C

## 2021-03-30 ENCOUNTER — Encounter: Payer: Self-pay | Admitting: Urology

## 2021-03-30 ENCOUNTER — Other Ambulatory Visit: Payer: Self-pay

## 2021-03-30 ENCOUNTER — Ambulatory Visit (INDEPENDENT_AMBULATORY_CARE_PROVIDER_SITE_OTHER): Payer: 59 | Admitting: Urology

## 2021-03-30 VITALS — BP 139/86 | HR 109 | Ht 65.0 in | Wt 162.0 lb

## 2021-03-30 DIAGNOSIS — N3944 Nocturnal enuresis: Secondary | ICD-10-CM | POA: Diagnosis not present

## 2021-03-30 DIAGNOSIS — R319 Hematuria, unspecified: Secondary | ICD-10-CM | POA: Diagnosis not present

## 2021-03-30 DIAGNOSIS — N3281 Overactive bladder: Secondary | ICD-10-CM

## 2021-03-30 LAB — BLADDER SCAN AMB NON-IMAGING

## 2021-03-30 MED ORDER — MIRABEGRON ER 50 MG PO TB24: 50.0000 mg | ORAL_TABLET | Freq: Every day | ORAL | 3 refills | Status: DC

## 2021-04-02 ENCOUNTER — Ambulatory Visit: Payer: Self-pay | Admitting: Podiatry

## 2021-04-09 ENCOUNTER — Ambulatory Visit: Payer: 59 | Admitting: Physician Assistant

## 2021-04-12 ENCOUNTER — Encounter: Payer: Self-pay | Admitting: Physician Assistant

## 2021-04-12 ENCOUNTER — Other Ambulatory Visit: Payer: Self-pay

## 2021-04-12 ENCOUNTER — Ambulatory Visit (INDEPENDENT_AMBULATORY_CARE_PROVIDER_SITE_OTHER): Payer: 59 | Admitting: Physician Assistant

## 2021-04-12 VITALS — BP 116/80 | HR 90 | Temp 98.8°F | Resp 16 | Ht 65.0 in | Wt 165.4 lb

## 2021-04-12 DIAGNOSIS — J449 Chronic obstructive pulmonary disease, unspecified: Secondary | ICD-10-CM | POA: Diagnosis not present

## 2021-04-12 DIAGNOSIS — G4734 Idiopathic sleep related nonobstructive alveolar hypoventilation: Secondary | ICD-10-CM | POA: Diagnosis not present

## 2021-04-12 DIAGNOSIS — J4520 Mild intermittent asthma, uncomplicated: Secondary | ICD-10-CM | POA: Diagnosis not present

## 2021-04-12 DIAGNOSIS — R0602 Shortness of breath: Secondary | ICD-10-CM | POA: Diagnosis not present

## 2021-04-12 DIAGNOSIS — I340 Nonrheumatic mitral (valve) insufficiency: Secondary | ICD-10-CM

## 2021-04-12 DIAGNOSIS — G4733 Obstructive sleep apnea (adult) (pediatric): Secondary | ICD-10-CM

## 2021-04-12 DIAGNOSIS — J4489 Other specified chronic obstructive pulmonary disease: Secondary | ICD-10-CM

## 2021-04-12 NOTE — Progress Notes (Signed)
[facility] [place], [place] [address]  Pulmonary Sleep Medicine   Office Visit Note  Patient Name: Rachael Jordan DOB: 12/28/1955 MRN [id number]  Date of Service: 04/17/2021  Complaints/HPI: Pt is here for routine pulmonary visit to review cpap titration results, but this had to be moved to feb 28th. Did have echo though and will review results. It showed Mild-mod MR with diastolic dysfunction which may be contributing to SOB. Recommend repeat ehco in 1 year and patient may follow up with cardiology, especialy if any worsening symptoms. Denies any ankle swelling. Again discussed that SOB can also be due underlying lung disease, untreated OSA, and medications causing respiratory depression. Her spiro did show decreased FEV1 however was having difficulty with test today and may not be accurate as she denies any trouble breathing in office. Will plan for PFT after next visit.  ROS  General: (-) fever, (-) chills, (-) night sweats, (-) weakness Skin: (-) rashes, (-) itching,. Eyes: (-) visual changes, (-) redness, (-) itching. Nose and Sinuses: (-) nasal stuffiness or itchiness, (-) postnasal drip, (-) nosebleeds, (-) sinus trouble. Mouth and Throat: (-) sore throat, (-) hoarseness. Neck: (-) swollen glands, (-) enlarged thyroid, (-) neck pain. Respiratory: - cough, (-) bloody sputum, + shortness of breath, - wheezing. Cardiovascular: - ankle swelling, (-) chest pain. Lymphatic: (-) lymph node enlargement. Neurologic: (-) numbness, (-) tingling. Psychiatric: (-) anxiety, (-) depression   Current Medication: Outpatient Encounter Medications as of 04/12/2021  Medication Sig   albuterol (PROVENTIL) (2.5 MG/3ML) 0.083% nebulizer solution Take 2.5 mg by nebulization every 6 (six) hours as needed for wheezing or shortness of breath.   albuterol (VENTOLIN HFA) 108 (90 Base) MCG/ACT inhaler Inhale 2 puffs into the lungs every 6 (six) hours as needed for wheezing or  shortness of breath.   amLODipine (NORVASC) 5 MG tablet Take 5 mg by mouth daily.   aspirin EC 81 MG tablet Take 81 mg by mouth daily.   atorvastatin (LIPITOR) 20 MG tablet Take 20 mg by mouth daily.   azelastine (ASTELIN) 0.1 % nasal spray Place 1-2 sprays into both nostrils 2 (two) times daily.    Biotin 10000 MCG TABS Take 10 mg by mouth daily.    cetirizine (ZYRTEC) 10 MG tablet Take 10 mg by mouth at bedtime.    Cholecalciferol 25 MCG (1000 UT) tablet Take 1,000 Units by mouth daily.    Cyanocobalamin (B-12) 1000 MCG TABS Take by mouth daily.   diclofenac Sodium (VOLTAREN) 1 % GEL Apply topically.   gabapentin (NEURONTIN) 800 MG tablet Take by mouth.   HYDROcodone-acetaminophen (NORCO) 10-325 MG tablet Take by mouth.   ipratropium (ATROVENT) 0.06 % nasal spray Place into the nose.   mirabegron ER (MYRBETRIQ) 50 MG TB24 tablet Take 1 tablet (50 mg total) by mouth daily.   montelukast (SINGULAIR) 10 MG tablet Take 10 mg by mouth in the morning.   Multiple Vitamins-Minerals (CENTRUM ADULTS PO) Take 1 tablet by mouth daily.    naloxone (NARCAN) nasal spray 4 mg/0.1 mL SMARTSIG:Both Nares   nortriptyline (PAMELOR) 10 MG capsule Take 10 mg by mouth 2 (two) times daily.   olmesartan (BENICAR) 40 MG tablet Take 1 tablet by mouth daily.   oxymorphone (OPANA) 10 MG tablet  (Patient not taking: Reported on 04/16/2021)   pantoprazole (PROTONIX) 40 MG tablet Take 40 mg by mouth daily.   pyridOXINE (VITAMIN B-6) 100 MG tablet Take 100 mg by mouth daily.   rizatriptan (MAXALT) 10 MG  tablet Take 10 mg by mouth as needed for migraine. May repeat in 2 hours if needed   tiZANidine (ZANAFLEX) 4 MG tablet Take by mouth.   traZODone (DESYREL) 100 MG tablet Take 200 mg by mouth at bedtime.    valACYclovir (VALTREX) 1000 MG tablet Take 1,000 mg by mouth daily as needed.   vitamin C (ASCORBIC ACID) 500 MG tablet Take 500 mg by mouth daily.   Vitamin E 45 MG CAPS Take 90 mg by mouth daily.   [DISCONTINUED]  budesonide-formoterol (SYMBICORT) 80-4.5 MCG/ACT inhaler Inhale 2 puffs into the lungs 2 (two) times daily.   [DISCONTINUED] azelastine (ASTELIN) 0.1 % nasal spray Place into the nose. (Patient not taking: Reported on 04/12/2021)   [DISCONTINUED] tiZANidine (ZANAFLEX) 4 MG tablet Take by mouth. (Patient not taking: Reported on 04/12/2021)   Facility-Administered Encounter Medications as of 04/12/2021  Medication   betamethasone acetate-betamethasone sodium phosphate (CELESTONE) injection 3 mg    Surgical History: Past Surgical History:  Procedure Laterality Date   ABDOMINAL HYSTERECTOMY     vaginal   APPENDECTOMY     BREAST BIOPSY     COLONOSCOPY N/A 01/02/2020   Procedure: COLONOSCOPY;  Surgeon: Toledo, Benay Pike, MD;  Location: [facility] ENDOSCOPY;  Service: Gastroenterology;  Laterality: N/A;   COLONOSCOPY WITH PROPOFOL N/A 12/29/2018   Procedure: COLONOSCOPY WITH PROPOFOL;  Surgeon: Lucilla Lame, MD;  Location: [facility] ENDOSCOPY;  Service: Endoscopy;  Laterality: N/A;   ESOPHAGOGASTRODUODENOSCOPY N/A 01/02/2020   Procedure: ESOPHAGOGASTRODUODENOSCOPY (EGD);  Surgeon: Toledo, Benay Pike, MD;  Location: [facility] ENDOSCOPY;  Service: Gastroenterology;  Laterality: N/A;   FOOT SURGERY Left    10/2015   NECK SURGERY      Medical History: Past Medical History:  Diagnosis Date   Acid reflux    Anxiety    Arthritis    Asthma    Bronchitis 09/2015   CVA (cerebral vascular accident) ([place])    Gross hematuria    H. pylori infection    Heart murmur    Kidney failure    Labile essential hypertension 01/06/2018   Lung abnormality    "damaged lung due to pneumonia"   Neck pain    Nephrolithiasis    Pneumonia    Pneumothorax    Scoliosis    Seasonal allergies    Urinary frequency    Vaginal atrophy     Family History: Family History  Problem Relation Age of Onset   Arthritis Mother    Asthma Mother    Hyperlipidemia Mother    Hypertension Mother    Diabetes Sister    Cancer Maternal  Aunt        esophagus   Breast cancer Neg Hx    Kidney cancer Neg Hx    Bladder Cancer Neg Hx     Social History: Social History   Socioeconomic History   Marital status: Legally Separated    Spouse name: Not on file   Number of children: Not on file   Years of education: Not on file   Highest education level: Not on file  Occupational History   Not on file  Tobacco Use   Smoking status: Never   Smokeless tobacco: Never  Vaping Use   Vaping Use: Never used  Substance and Sexual Activity   Alcohol use: No    Alcohol/week: 0.0 standard drinks   Drug use: No   Sexual activity: Not on file  Other Topics Concern   Not on file  Social History Narrative   Not on file  Social Determinants of Health   Financial Resource Strain: Not on file  Food Insecurity: Not on file  Transportation Needs: Not on file  Physical Activity: Not on file  Stress: Not on file  Social Connections: Not on file  Intimate Partner Violence: Not on file    Vital Signs: Blood pressure 116/80, pulse 90, temperature 98.8 F (37.1 C), resp. rate 16, height 5' 5"  (1.651 m), weight 165 lb 6.4 oz (75 kg), SpO2 99 %.  Examination: General Appearance: The patient is well-developed, well-nourished, and in no distress. Skin: Gross inspection of skin unremarkable. Head: normocephalic, no gross deformities. Eyes: no gross deformities noted. ENT: ears appear grossly normal no exudates. Neck: Supple. No thyromegaly. No LAD. Respiratory: Lungs clear to auscultation bilaterally. Cardiovascular: Normal S1 and S2 without murmur or rub. Extremities: No cyanosis. pulses are equal. Neurologic: Alert and oriented. No involuntary movements.  LABS: Recent Results (from the past 2160 hour(s))  SARS CORONAVIRUS 2 (TAT 6-24 HRS) Nasopharyngeal Nasopharyngeal Swab     Status: None   Collection Time: 02/02/21 10:16 AM   Specimen: Nasopharyngeal Swab  Result Value Ref Range   SARS Coronavirus 2 NEGATIVE NEGATIVE     Comment: (NOTE) SARS-CoV-2 target nucleic acids are NOT DETECTED.  The SARS-CoV-2 RNA is generally detectable in upper and lower respiratory specimens during the acute phase of infection. Negative results do not preclude SARS-CoV-2 infection, do not rule out co-infections with other pathogens, and should not be used as the sole basis for treatment or other patient management decisions. Negative results must be combined with clinical observations, patient history, and epidemiological information. The expected result is Negative.  Fact Sheet for Patients: SugarRoll.be  Fact Sheet for Healthcare Providers: https://www.woods-mathews.com/  This test is not yet approved or cleared by the [place] FDA and  has been authorized for detection and/or diagnosis of SARS-CoV-2 by FDA under an Emergency Use Authorization (EUA). This EUA will remain  in effect (meaning this test can be used) for the duration of the COVID-19 declaration under Se ction 564(b)(1) of the Act, 21 U.S.C. section 360bbb-3(b)(1), unless the authorization is terminated or revoked sooner.  Performed at [facility], [place] [address], [place], [place] [address]   SARS CORONAVIRUS 2 (TAT 6-24 HRS) Nasopharyngeal Nasopharyngeal Swab     Status: None   Collection Time: 03/16/21 10:26 AM   Specimen: Nasopharyngeal Swab  Result Value Ref Range   SARS Coronavirus 2 NEGATIVE NEGATIVE    Comment: (NOTE) SARS-CoV-2 target nucleic acids are NOT DETECTED.  The SARS-CoV-2 RNA is generally detectable in upper and lower respiratory specimens during the acute phase of infection. Negative results do not preclude SARS-CoV-2 infection, do not rule out co-infections with other pathogens, and should not be used as the sole basis for treatment or other patient management decisions. Negative results must be combined with clinical observations, patient history, and epidemiological  information. The expected result is Negative.  Fact Sheet for Patients: SugarRoll.be  Fact Sheet for Healthcare Providers: https://www.woods-mathews.com/  This test is not yet approved or cleared by the [place] FDA and  has been authorized for detection and/or diagnosis of SARS-CoV-2 by FDA under an Emergency Use Authorization (EUA). This EUA will remain  in effect (meaning this test can be used) for the duration of the COVID-19 declaration under Se ction 564(b)(1) of the Act, 21 U.S.C. section 360bbb-3(b)(1), unless the authorization is terminated or revoked sooner.  Performed at [facility], [place] [address], [place], [place]  [address]   Bladder Scan (Post Void Residual) in office     Status: None   Collection Time: 03/30/21  9:16 AM  Result Value Ref Range   Scan Result 75m     Radiology: No results found.  No results found.  SLEEP STUDY DOCUMENTS  Result Date: 04/12/2021 Ordered by an unspecified provider.  SLEEP STUDY DOCUMENTS  Result Date: 04/12/2021 Ordered by an unspecified provider.  SLEEP STUDY DOCUMENTS  Result Date: 04/12/2021 Ordered by an unspecified provider.     Assessment and Plan: Patient Active Problem List   Diagnosis Date Noted   Neuropathic pain of right lower extremity 04/16/2021   Ischemic colitis ([place]    Rectal bleeding    Left lower quadrant abdominal pain    Lower GI bleed    Hypotension    Acute colitis 12/30/2019   Lumbar herniated disc 06/16/2019   Acute nonintractable headache    Multiple falls 02/20/2019   Myoclonic jerking 02/20/2019   COPD (chronic obstructive pulmonary disease) ([place] 02/20/2019   Seasonal allergies 02/20/2019   Insomnia 02/20/2019   Depression with anxiety 02/20/2019   AKI (acute kidney injury) ([place] 02/20/2019   Dizziness 02/18/2019   Encounter for screening colonoscopy    Bunion 05/06/2018   Metatarsalgia of right foot 05/06/2018   Age-related  osteoporosis without current pathological fracture 03/31/2018   False positive ana 03/31/2018   Mouth dryness 02/18/2018   Screening for osteoporosis 02/18/2018   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/2018   Essential hypertension 01/06/2018   Lacunar infarction ([place] 01/06/2018   Compression fracture of thoracic vertebra, sequela 07/12/2017   Lumbar compression fracture, sequela 07/12/2017   Nocturnal hypoxia 07/12/2017   Simple chronic bronchitis ([place] 07/12/2017   Intractable back pain 06/05/2017   Overweight (BMI 25.0-29.9) 04/08/2017   Chronic pain syndrome 04/06/2017   Sensory disturbance 01/08/2017   Pain in the groin, right 10/28/2016   Paresthesia of both hands 12/14/2015   B12 deficiency 10/31/2015   SOB (shortness of breath) 08/29/2015   History of hematuria 07/12/2015   Urinary frequency 07/12/2015   Vaginal atrophy 07/12/2015   HAV (hallux abducto valgus) 07/02/2015   Overlapping toe 07/02/2015   Hammertoe 07/02/2015   LBP (low back pain) 01/17/2015   Trochanteric bursitis of right hip 01/17/2015   PNA (pneumonia) 12/28/2014   DDD (degenerative disc disease), lumbar 08/15/2014   Chronic radicular lumbar pain 08/15/2014   Spinal stenosis, lumbar region, 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/15/2014   DDD (degenerative disc disease), cervical 08/15/2014   Bilateral occipital neuralgia 08/15/2014   Migraine 08/15/2014   Sacroiliac joint dysfunction 08/15/2014   Back pain, chronic 09/23/2013   Chronic cervical pain 09/23/2013   Arthralgia of temporomandibular joint 08/29/2013   Absolute anemia 08/15/2013   Chronic kidney disease (CKD), stage III (moderate) ([place] 08/15/2013   Abnormal serum level of alkaline phosphatase 08/15/2013   Blush 08/15/2013   Hematuria, microscopic 08/15/2013   Inflamed nasal mucosa 08/15/2013   Big thyroid 08/15/2013   Disease of thyroid gland 08/15/2013   Positive H. pylori test 08/15/2013   Gastrointestinal ulcer due to  Helicobacter pylori 084/53/6468  Other specified bacterial intestinal infections 08/14/2013   Acute kidney injury superimposed on CKD ([facility] 08/09/2013   SIRS (systemic inflammatory response syndrome) ([place] 08/09/2013   Systemic inflammatory response syndrome (SIRS) ([facility] 08/09/2013    1. Obstructive chronic bronchitis without exacerbation ([place] Continue inhalers as prescribed and as indicated  2. Nonrheumatic mitral valve regurgitation  mild to moderate MR seen on updated echo along  with diastolic dysfunction.  Recommend repeat echo in 1 year.  Patient may follow-up with cardiology   3. OSA (obstructive sleep apnea) CPAP titration scheduled for next month  4. Mild intermittent asthma without complication Continue inhalers as prescribed and as indicated  5. Nocturnal hypoxia Continue oxygen at night  6. Shortness of breath - Spirometry with Graph shows FEV1 of 1.2 L, 55% however this was likely a low quality test.  Patient will be due for PFT after next visit   General Counseling: I have discussed the findings of the evaluation and examination with Rachael Jordan.  I have also discussed any further diagnostic evaluation thatmay be needed or ordered today. Rachael Jordan verbalizes understanding of the findings of todays visit. We also reviewed her medications today and discussed drug interactions and side effects including but not limited excessive drowsiness and altered mental states. We also discussed that there is always a risk not just to her but also people around her. she has been encouraged to call the office with any questions or concerns that should arise related to todays visit.  Orders Placed This Encounter  Procedures   Spirometry with Graph    Order Specific Question:   Where should this test be performed?    Answer:   [facility]    Order Specific Question:   Basic spirometry    Answer:   Yes     Time spent: 30  I have personally obtained a history, examined the  patient, evaluated laboratory and imaging results, formulated the assessment and plan and placed orders. This patient was seen by Drema Dallas, PA-C in collaboration with Dr. Devona Konig as a part of collaborative care agreement.     Allyne Gee, MD [facility] Pulmonary and Critical Care Sleep medicine

## 2021-04-16 ENCOUNTER — Ambulatory Visit
Payer: 59 | Attending: Student in an Organized Health Care Education/Training Program | Admitting: Student in an Organized Health Care Education/Training Program

## 2021-04-16 ENCOUNTER — Encounter: Payer: Self-pay | Admitting: Student in an Organized Health Care Education/Training Program

## 2021-04-16 ENCOUNTER — Other Ambulatory Visit: Payer: Self-pay

## 2021-04-16 VITALS — BP 104/75 | HR 90 | Temp 97.2°F | Resp 16 | Ht 65.0 in | Wt 165.0 lb

## 2021-04-16 DIAGNOSIS — M792 Neuralgia and neuritis, unspecified: Secondary | ICD-10-CM | POA: Insufficient documentation

## 2021-04-16 DIAGNOSIS — G894 Chronic pain syndrome: Secondary | ICD-10-CM | POA: Insufficient documentation

## 2021-04-16 DIAGNOSIS — G8929 Other chronic pain: Secondary | ICD-10-CM | POA: Diagnosis not present

## 2021-04-16 DIAGNOSIS — M5416 Radiculopathy, lumbar region: Secondary | ICD-10-CM | POA: Diagnosis not present

## 2021-04-16 NOTE — Progress Notes (Signed)
PROVIDER NOTE: Information contained herein reflects review and annotations entered in association with encounter. Interpretation of such information and data should be left to medically-trained personnel. Information provided to patient can be located elsewhere in the medical record under "Patient Instructions". Document created using STT-dictation technology, any transcriptional errors that may result from process are unintentional.    Patient: Rachael Jordan  Service Category: E/M  Provider: Gillis Santa, MD  DOB: 06-23-1955  DOS: 04/16/2021  Specialty: Interventional Pain Management  MRN: [id number]  Setting: Ambulatory outpatient  PCP: Rachael Peach, MD  Type: Established Patient    Referring Provider: Sharyne Peach, MD  Location: Office  Delivery: Face-to-face     HPI  Rachael Jordan, a 66 y.o. year old female, is here today because of her Chronic radicular lumbar pain [M54.16, G89.29]. Ms. Fuerstenberg primary complain today is Back Pain (lower) Last encounter: My last encounter with her was on 09/14/20 Pertinent problems: Rachael Jordan has DDD (degenerative disc disease), lumbar; Chronic radicular lumbar pain; Spinal stenosis, lumbar region, with neurogenic claudication; Back pain, chronic; and Chronic pain syndrome on their pertinent problem list. Pain Assessment: Severity of Chronic pain is reported as a 8 /10. Location: Back Lower/through hips and down legs, bilat; "sometime toes are tingling"; and around to groin. Onset: More than a month ago. Quality: Constant. Timing: Constant. Modifying factor(s): cool and warm compresses, reclining. Vitals:  height is 5' 5"  (1.651 m) and weight is 165 lb (74.8 kg). Her temporal temperature is 97.2 F (36.2 C) (abnormal). Her blood pressure is 104/75 and her pulse is 90. Her respiration is 16 and oxygen saturation is 98%.   Reason for encounter: evaluation of worsening, or previously known (established) problem.    [name] follows up today with  increased low back pain with radiation into bilateral buttocks, bilateral thighs and occasionally into her groin.  She has a history of chronic lumbar radiculopathy with lumbar spinal stenosis and neurogenic claudication.  She has attempted a spinal cord stimulator trial which unfortunately was not successful.  As result she did not move forward with permanent implant.  Today we discussed a lumbar epidural steroid injection with oral Valium for her increased lumbar radicular pain.  Risks and benefits reviewed and patient would like to proceed.  She states that she has been trying to do home stretching exercises but she is in too much pain to do them regularly.  She also states that they were not helpful when she was doing them in the past.  She has not had any injections in the last 12 months.  ROS  Constitutional: Denies any fever or chills Gastrointestinal: No reported hemesis, hematochezia, vomiting, or acute GI distress Musculoskeletal:  Low back pain with radiation into the bilateral buttocks and occasionally legs Neurological: No reported episodes of acute onset apraxia, aphasia, dysarthria, agnosia, amnesia, paralysis, loss of coordination, or loss of consciousness  Medication Review  B-12, Biotin, Cholecalciferol, HYDROcodone-acetaminophen, Multiple Vitamins-Minerals, Vitamin E, albuterol, amLODipine, aspirin EC, atorvastatin, azelastine, budesonide-formoterol, cetirizine, diclofenac Sodium, gabapentin, ipratropium, mirabegron ER, montelukast, naloxone, nortriptyline, olmesartan, oxymorphone, pantoprazole, pyridOXINE, rizatriptan, tiZANidine, traZODone, valACYclovir, and vitamin C  History Review  Allergy: Rachael Jordan is allergic to morphine and related, oxymorphone, grapefruit extract, oxycodone, and propoxyphene. Drug: Rachael Jordan  reports no history of drug use. Alcohol:  reports no history of alcohol use. Tobacco:  reports that she has never smoked. She has never used smokeless  tobacco. Social: Ms. Hotard  reports that she has never smoked. She has  never used smokeless tobacco. She reports that she does not drink alcohol and does not use drugs. Medical:  has a past medical history of Acid reflux, Anxiety, Arthritis, Asthma, Bronchitis (09/2015), CVA (cerebral vascular accident) ([place]), Gross hematuria, H. 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 Surgical: Rachael Jordan  has a past surgical history that includes Appendectomy; Abdominal hysterectomy; Neck surgery; Foot surgery (Left); Colonoscopy with propofol (N/A, 12/29/2018); Esophagogastroduodenoscopy (N/A, 01/02/2020); Colonoscopy (N/A, 01/02/2020); and Breast biopsy. Family: family history includes Arthritis in her mother; Asthma in her mother; Cancer in her maternal aunt; Diabetes in her sister; Hyperlipidemia in her mother; Hypertension in her mother.  Laboratory Chemistry Profile   Renal Lab Results  Component Value Date   BUN 5 (L) 01/04/2020   CREATININE 1.00 01/04/2020   LABCREA 157 12/28/2018   BCR 11 (L) 09/29/2017   GFRAA 58 (L) 06/18/2019   GFRNONAA >60 01/04/2020    Hepatic Lab Results  Component Value Date   AST 19 12/31/2019   ALT 14 12/31/2019   ALBUMIN 3.7 12/31/2019   ALKPHOS 54 12/31/2019   LIPASE 25 12/30/2019    Electrolytes Lab Results  Component Value Date   NA 143 01/04/2020   K 3.3 (L) 01/04/2020   CL 107 01/04/2020   CALCIUM 8.9 01/04/2020   MG 2.1 12/31/2019   PHOS 4.5 12/31/2019    Bone No results found for: VD25OH, VD125OH2TOT, TX6468EH2, ZY2482NO0, 25OHVITD1, 25OHVITD2, 25OHVITD3, TESTOFREE, TESTOSTERONE  Inflammation (CRP: Acute Phase) (ESR: Chronic Phase) Lab Results  Component Value Date   LATICACIDVEN 1.1 12/30/2019         Note: Above Lab results reviewed.  Recent Imaging  Review  SLEEP STUDY DOCUMENTS Ordered by an unspecified provider. SLEEP STUDY DOCUMENTS Ordered by an unspecified provider. SLEEP STUDY DOCUMENTS Ordered by an unspecified provider. Note: Reviewed        Physical Exam  General appearance: Well nourished, well developed, and well hydrated. In no apparent acute distress Mental status: Alert, oriented x 3 (person, place, & time)       Respiratory: No evidence of acute respiratory distress Eyes: PERLA Vitals: BP 104/75    Pulse 90    Temp (!) 97.2 F (36.2 C) (Temporal)    Resp 16    Ht 5' 5"  (1.651 m)    Wt 165 lb (74.8 kg)    SpO2 98%    BMI 27.46 kg/m  BMI: Estimated body mass index is 27.46 kg/m as calculated from the following:   Height as of this encounter: 5' 5"  (1.651 m).   Weight as of this encounter: 165 lb (74.8 kg). Ideal: Ideal body weight: 57 kg (125 lb 10.6 oz) Adjusted ideal body weight: 64.1 kg (141 lb 6.4 oz)    Lumbar Exam  Skin & Axial Inspection: No masses, redness, or swelling Alignment: Symmetrical Functional ROM: Pain restricted ROM affecting both sides Stability: No instability detected Muscle Tone/Strength: Functionally intact. No obvious neuro-muscular anomalies detected. Sensory (Neurological): Neurogenic pain pattern   Provocative Tests: Hyperextension/rotation test: (+) due to pain. Lumbar quadrant test (Kemp's test): (+) due to pain. Lateral bending test: deferred today       Patrick's Maneuver: deferred today                   FABER* test: deferred today  S-I anterior distraction/compression test: deferred today         S-I lateral compression test: deferred today         S-I Thigh-thrust test: deferred today         S-I Gaenslen's test: deferred today         *(Flexion, ABduction and External Rotation)   Gait & Posture Assessment  Ambulation: Limited Gait: Antalgic gait (limping) Posture: Difficulty standing up straight, due to pain    Lower Extremity Exam      Side:  Right lower extremity   Side: Left lower extremity  Stability: No instability observed           Stability: No instability observed          Skin & Extremity Inspection: Skin color, temperature, and hair growth are WNL. No peripheral edema or cyanosis. No masses, redness, swelling, asymmetry, or associated skin lesions. No contractures.   Skin & Extremity Inspection: Skin color, temperature, and hair growth are WNL. No peripheral edema or cyanosis. No masses, redness, swelling, asymmetry, or associated skin lesions. No contractures.  Functional ROM: Pain restricted ROM for hip and knee joints           Functional ROM: Pain restricted ROM for hip and knee joints          Muscle Tone/Strength: Functionally intact. No obvious neuro-muscular anomalies detected.   Muscle Tone/Strength: Functionally intact. No obvious neuro-muscular anomalies detected.  Sensory (Neurological): Neurogenic pain pattern         Sensory (Neurological): Neurogenic pain pattern        DTR: Patellar: deferred today Achilles: deferred today Plantar: deferred today   DTR: Patellar: deferred today Achilles: deferred today Plantar: deferred today  Palpation: No palpable anomalies   Palpation: No palpable anomalies     Assessment   Status Diagnosis  Having a Flare-up Having a Flare-up Having a Flare-up 1. Chronic radicular lumbar pain   2. Lumbar radiculopathy   3. Neuropathic pain of left lower extremity   4. Neuropathic pain of right lower extremity   5. Chronic pain syndrome      Updated Problems: Problem  Chronic Radicular Lumbar Pain  Neuropathic Pain of Right Lower Extremity    Plan of Care   Orders:  Orders Placed This Encounter  Procedures   Lumbar Epidural Injection    Standing Status:   Future    Standing Expiration Date:   05/15/2021    Scheduling Instructions:     L5/S1 with PO valium    Order Specific Question:   Where will this procedure be performed?    Answer:   [facility]    Follow-up plan:   Return in about 1 week (around 04/23/2021) for L5/S1 ESI , minimal sedation (PO Valium).    Recent Visits No visits were found meeting these conditions. Showing recent visits within past 90 days and meeting all other requirements Today's Visits Date Type Provider Dept  04/16/21 Office Visit Gillis Santa, MD [facility]  Showing today's visits and meeting all other requirements Future Appointments No visits were found meeting these conditions. Showing future appointments within next 90 days and meeting all other requirements  I discussed the assessment and treatment plan with the patient. The patient was provided an opportunity to ask questions and all were answered. The patient agreed with the plan and demonstrated an understanding of the instructions.  Patient advised to call back or seek an in-person evaluation if the symptoms or condition worsens.  Duration of encounter: 42mnutes.  Note by: BGillis Santa MD Date: 04/16/2021; Time: 1:56 PM

## 2021-04-16 NOTE — Progress Notes (Signed)
Safety precautions to be maintained throughout the outpatient stay will include: orient to surroundings, keep bed in low position, maintain call bell within reach at all times, provide assistance with transfer out of bed and ambulation.  

## 2021-04-16 NOTE — Patient Instructions (Signed)
GENERAL RISKS AND COMPLICATIONS  What are the risk, side effects and possible complications? Generally speaking, most procedures are safe.  However, with any procedure there are risks, side effects, and the possibility of complications.  The risks and complications are dependent upon the sites that are lesioned, or the type of nerve block to be performed.  The closer the procedure is to the spine, the more serious the risks are.  Great care is taken when placing the radio frequency needles, block needles or lesioning probes, but sometimes complications can occur. Infection: Any time there is an injection through the skin, there is a risk of infection.  This is why sterile conditions are used for these blocks.  There are four possible types of infection. Localized skin infection. Central Nervous System Infection-This can be in the form of Meningitis, which can be deadly. Epidural Infections-This can be in the form of an epidural abscess, which can cause pressure inside of the spine, causing compression of the spinal cord with subsequent paralysis. This would require an emergency surgery to decompress, and there are no guarantees that the patient would recover from the paralysis. Discitis-This is an infection of the intervertebral discs.  It occurs in about 1% of discography procedures.  It is difficult to treat and it may lead to surgery.        2. Pain: the needles have to go through skin and soft tissues, will cause soreness.       3. Damage to internal structures:  The nerves to be lesioned may be near blood vessels or    other nerves which can be potentially damaged.       4. Bleeding: Bleeding is more common if the patient is taking blood thinners such as  aspirin, Coumadin, Ticiid, Plavix, etc., or if he/she have some genetic predisposition  such as hemophilia. Bleeding into the spinal canal can cause compression of the spinal  cord with subsequent paralysis.  This would require an emergency  surgery to  decompress and there are no guarantees that the patient would recover from the  paralysis.       5. Pneumothorax:  Puncturing of a lung is a possibility, every time a needle is introduced in  the area of the chest or upper back.  Pneumothorax refers to free air around the  collapsed lung(s), inside of the thoracic cavity (chest cavity).  Another two possible  complications related to a similar event would include: Hemothorax and Chylothorax.   These are variations of the Pneumothorax, where instead of air around the collapsed  lung(s), you may have blood or chyle, respectively.       6. Spinal headaches: They may occur with any procedures in the area of the spine.       7. Persistent CSF (Cerebro-Spinal Fluid) leakage: This is a rare problem, but may occur  with prolonged intrathecal or epidural catheters either due to the formation of a fistulous  track or a dural tear.       8. Nerve damage: By working so close to the spinal cord, there is always a possibility of  nerve damage, which could be as serious as a permanent spinal cord injury with  paralysis.       9. Death:  Although rare, severe deadly allergic reactions known as "Anaphylactic  reaction" can occur to any of the medications used.      10. Worsening of the symptoms:  We can always make thing worse.  What are the chances  of something like this happening? Chances of any of this occuring are extremely low.  By statistics, you have more of a chance of getting killed in a motor vehicle accident: while driving to the hospital than any of the above occurring .  Nevertheless, you should be aware that they are possibilities.  In general, it is similar to taking a shower.  Everybody knows that you can slip, hit your head and get killed.  Does that mean that you should not shower again?  Nevertheless always keep in mind that statistics do not mean anything if you happen to be on the wrong side of them.  Even if a procedure has a 1 (one) in a  1,000,000 (million) chance of going wrong, it you happen to be that one..Also, keep in mind that by statistics, you have more of a chance of having something go wrong when taking medications.  Who should not have this procedure? If you are on a blood thinning medication (e.g. Coumadin, Plavix, see list of "Blood Thinners"), or if you have an active infection going on, you should not have the procedure.  If you are taking any blood thinners, please inform your physician.  How should I prepare for this procedure? Do not eat or drink anything at least six hours prior to the procedure. Bring a driver with you .  It cannot be a taxi. Come accompanied by an adult that can drive you back, and that is strong enough to help you if your legs get weak or numb from the local anesthetic. Take all of your medicines the morning of the procedure with just enough water to swallow them. If you have diabetes, make sure that you are scheduled to have your procedure done first thing in the morning, whenever possible. If you have diabetes, take only half of your insulin dose and notify our nurse that you have done so as soon as you arrive at the clinic. If you are diabetic, but only take blood sugar pills (oral hypoglycemic), then do not take them on the morning of your procedure.  You may take them after you have had the procedure. Do not take aspirin or any aspirin-containing medications, at least eleven (11) days prior to the procedure.  They may prolong bleeding. Wear loose fitting clothing that may be easy to take off and that you would not mind if it got stained with Betadine or blood. Do not wear any jewelry or perfume Remove any nail coloring.  It will interfere with some of our monitoring equipment.  NOTE: Remember that this is not meant to be interpreted as a complete list of all possible complications.  Unforeseen problems may occur.  BLOOD THINNERS The following drugs contain aspirin or other products,  which can cause increased bleeding during surgery and should not be taken for 2 weeks prior to and 1 week after surgery.  If you should need take something for relief of minor pain, you may take acetaminophen which is found in Tylenol,m Datril, Anacin-3 and Panadol. It is not blood thinner. The products listed below are.  Do not take any of the products listed below in addition to any listed on your instruction sheet.  A.P.C or A.P.C with Codeine Codeine Phosphate Capsules #3 Ibuprofen Ridaura  ABC compound Congesprin Imuran rimadil  Advil Cope Indocin Robaxisal  Alka-Seltzer Effervescent Pain Reliever and Antacid Coricidin or Coricidin-D  Indomethacin Rufen  Alka-Seltzer plus Cold Medicine Cosprin Ketoprofen S-A-C Tablets  Anacin Analgesic Tablets or Capsules Coumadin  Korlgesic Salflex  Anacin Extra Strength Analgesic tablets or capsules CP-2 Tablets Lanoril Salicylate  Anaprox Cuprimine Capsules Levenox Salocol  Anexsia-D Dalteparin Magan Salsalate  Anodynos Darvon compound Magnesium Salicylate Sine-off  Ansaid Dasin Capsules Magsal Sodium Salicylate  Anturane Depen Capsules Marnal Soma  APF Arthritis pain formula Dewitt's Pills Measurin Stanback  Argesic Dia-Gesic Meclofenamic Sulfinpyrazone  Arthritis Bayer Timed Release Aspirin Diclofenac Meclomen Sulindac  Arthritis pain formula Anacin Dicumarol Medipren Supac  Analgesic (Safety coated) Arthralgen Diffunasal Mefanamic Suprofen  Arthritis Strength Bufferin Dihydrocodeine Mepro Compound Suprol  Arthropan liquid Dopirydamole Methcarbomol with Aspirin Synalgos  ASA tablets/Enseals Disalcid Micrainin Tagament  Ascriptin Doan's Midol Talwin  Ascriptin A/D Dolene Mobidin Tanderil  Ascriptin Extra Strength Dolobid Moblgesic Ticlid  Ascriptin with Codeine Doloprin or Doloprin with Codeine Momentum Tolectin  Asperbuf Duoprin Mono-gesic Trendar  Aspergum Duradyne Motrin or Motrin IB Triminicin  Aspirin plain, buffered or enteric coated  Durasal Myochrisine Trigesic  Aspirin Suppositories Easprin Nalfon Trillsate  Aspirin with Codeine Ecotrin Regular or Extra Strength Naprosyn Uracel  Atromid-S Efficin Naproxen Ursinus  Auranofin Capsules Elmiron Neocylate Vanquish  Axotal Emagrin Norgesic Verin  Azathioprine Empirin or Empirin with Codeine Normiflo Vitamin E  Azolid Emprazil Nuprin Voltaren  Bayer Aspirin plain, buffered or children's or timed BC Tablets or powders Encaprin Orgaran Warfarin Sodium  Buff-a-Comp Enoxaparin Orudis Zorpin  Buff-a-Comp with Codeine Equegesic Os-Cal-Gesic   Buffaprin Excedrin plain, buffered or Extra Strength Oxalid   Bufferin Arthritis Strength Feldene Oxphenbutazone   Bufferin plain or Extra Strength Feldene Capsules Oxycodone with Aspirin   Bufferin with Codeine Fenoprofen Fenoprofen Pabalate or Pabalate-SF   Buffets II Flogesic Panagesic   Buffinol plain or Extra Strength Florinal or Florinal with Codeine Panwarfarin   Buf-Tabs Flurbiprofen Penicillamine   Butalbital Compound Four-way cold tablets Penicillin   Butazolidin Fragmin Pepto-Bismol   Carbenicillin Geminisyn Percodan   Carna Arthritis Reliever Geopen Persantine   Carprofen Gold's salt Persistin   Chloramphenicol Goody's Phenylbutazone   Chloromycetin Haltrain Piroxlcam   Clmetidine heparin Plaquenil   Cllnoril Hyco-pap Ponstel   Clofibrate Hydroxy chloroquine Propoxyphen         Before stopping any of these medications, be sure to consult the physician who ordered them.  Some, such as Coumadin (Warfarin) are ordered to prevent or treat serious conditions such as "deep thrombosis", "pumonary embolisms", and other heart problems.  The amount of time that you may need off of the medication may also vary with the medication and the reason for which you were taking it.  If you are taking any of these medications, please make sure you notify your pain physician before you undergo any procedures.         Epidural Steroid  Injection Patient Information  Description: The epidural space surrounds the nerves as they exit the spinal cord.  In some patients, the nerves can be compressed and inflamed by a bulging disc or a tight spinal canal (spinal stenosis).  By injecting steroids into the epidural space, we can bring irritated nerves into direct contact with a potentially helpful medication.  These steroids act directly on the irritated nerves and can reduce swelling and inflammation which often leads to decreased pain.  Epidural steroids may be injected anywhere along the spine and from the neck to the low back depending upon the location of your pain.   After numbing the skin with local anesthetic (like Novocaine), a small needle is passed into the epidural space slowly.  You may experience a sensation of pressure while this  is being done.  The entire block usually last less than 10 minutes.  Conditions which may be treated by epidural steroids:  Low back and leg pain Neck and arm pain Spinal stenosis Post-laminectomy syndrome Herpes zoster (shingles) pain Pain from compression fractures  Preparation for the injection:  Do not eat any solid food or dairy products within 8 hours of your appointment.  You may drink clear liquids up to 3 hours before appointment.  Clear liquids include water, black coffee, juice or soda.  No milk or cream please. You may take your regular medication, including pain medications, with a sip of water before your appointment  Diabetics should hold regular insulin (if taken separately) and take 1/2 normal NPH dos the morning of the procedure.  Carry some sugar containing items with you to your appointment. A driver must accompany you and be prepared to drive you home after your procedure.  Bring all your current medications with your. An IV may be inserted and sedation may be given at the discretion of the physician.   A blood pressure cuff, EKG and other monitors will often be applied  during the procedure.  Some patients may need to have extra oxygen administered for a short period. You will be asked to provide medical information, including your allergies, prior to the procedure.  We must know immediately if you are taking blood thinners (like Coumadin/Warfarin)  Or if you are allergic to IV iodine contrast (dye). We must know if you could possible be pregnant.  Possible side-effects: Bleeding from needle site Infection (rare, may require surgery) Nerve injury (rare) Numbness & tingling (temporary) Difficulty urinating (rare, temporary) Spinal headache ( a headache worse with upright posture) Light -headedness (temporary) Pain at injection site (several days) Decreased blood pressure (temporary) Weakness in arm/leg (temporary) Pressure sensation in back/neck (temporary)  Call if you experience: Fever/chills associated with headache or increased back/neck pain. Headache worsened by an upright position. New onset weakness or numbness of an extremity below the injection site Hives or difficulty breathing (go to the emergency room) Inflammation or drainage at the infection site Severe back/neck pain Any new symptoms which are concerning to you  Please note:  Although the local anesthetic injected can often make your back or neck feel good for several hours after the injection, the pain will likely return.  It takes 3-7 days for steroids to work in the epidural space.  You may not notice any pain relief for at least that one week.  If effective, we will often do a series of three injections spaced 3-6 weeks apart to maximally decrease your pain.  After the initial series, we generally will wait several months before considering a repeat injection of the same type.  If you have any questions, please call [phone] [facility]

## 2021-04-17 ENCOUNTER — Ambulatory Visit: Payer: 59 | Admitting: Podiatry

## 2021-04-17 ENCOUNTER — Other Ambulatory Visit: Payer: Self-pay | Admitting: Physician Assistant

## 2021-04-17 DIAGNOSIS — R0602 Shortness of breath: Secondary | ICD-10-CM

## 2021-04-17 DIAGNOSIS — G5782 Other specified mononeuropathies of left lower limb: Secondary | ICD-10-CM | POA: Diagnosis not present

## 2021-04-17 NOTE — Patient Instructions (Signed)

## 2021-04-17 NOTE — Patient Instructions (Signed)
Morton Neuralgia Morton neuralgia is foot pain that affects the ball of the foot and the area near the toes. Morton neuralgia occurs when part of a nerve in the foot (digital nerve) is under too much pressure (compressed). When this happens over a long period of time, the nerve can thicken (neuroma) and cause pain. Pain usually occurs between the third and fourth toes.  Morton neuralgia can come and go but may get worse over time. What are the causes? This condition is caused by doing the same things over and over with your foot, such as: Activities such as running or jumping. Wearing shoes that are too tight. What increases the risk? You may be at higher risk for Morton neuralgia if you: Are female. Wear high heels. Wear shoes that are narrow or tight. Do activities that repeatedly stretch your toes, such as: Running. Ballet. Long-distance walking. What are the signs or symptoms? The first symptom of Morton neuralgia is pain that spreads from the ball of the foot to the toes. It may feel like you are walking on a marble. Pain usually gets worse with walking and goes away at night. Other symptoms may include numbness and cramping of your toes. Both feet are equally affected, but rarely at the same time. How is this diagnosed? This condition is diagnosed based on your symptoms, your medical history, and a physical exam. Your health care provider may: Squeeze your foot just behind your toe. Ask you to move your toes to check for pain. Ask about your physical activity level. You also may have imaging tests, such as an X-ray, ultrasound, or MRI. How is this treated? Treatment depends on how severe your condition is and what causes it. Treatment may involve: Wearing different shoes that are not too tight, are low-heeled, and provide good support. For some people, this is the only treatment needed. Wearing an over-the-counter or custom supportive pad (orthotic) under the front of your  foot. Getting injections of numbing medicine and anti-inflammatory medicine (steroid) in the nerve. Having surgery to remove part of the thickened nerve. Follow these instructions at home: Managing pain, stiffness, and swelling  Massage your foot as needed. Wear orthotics as told by your health care provider. If directed, put ice on the painful area. To do this: Put ice in a plastic bag. Place a towel between your skin and the bag. Leave the ice on for 20 minutes, 2-3 times a day. Remove the ice if your skin turns bright red. This is very important. If you cannot feel pain, heat, or cold, you have a greater risk of damage to the area. Raise (elevate) the injured area above the level of your heart while you are sitting or lying down. Avoid activities that cause pain or make pain worse. If you play sports, ask your health care provider when it is safe for you to return to sports. General instructions Take over-the-counter and prescription medicines only as told by your health care provider. For the time period you were told by your health care provider, do not drive or use machinery. Wear shoes that: Have soft soles. Have a wide toe area. Provide arch support. Do not pinch or squeeze your feet. Have room for your orthotics, if this applies. Keep all follow-up visits. This is important. Contact a health care provider if: Your symptoms get worse or do not get better with treatment and home care. Summary Morton neuralgia is foot pain that affects the ball of the foot and the area  near the toes. Pain usually occurs between the third and fourth toes, gets worse with walking, and goes away at night. Morton neuralgia occurs when part of a nerve in the foot (digital nerve) is under too much pressure. When this happens over a long period of time, the nerve can thicken (neuroma) and cause pain. This condition is caused by doing the same things over and over with your foot, such as running or  jumping, wearing shoes that are too tight, or wearing high heels. Treatment may involve wearing low-heeled shoes that are not too tight, wearing a supportive pad (orthotic) under the front of your foot, getting injections in the nerve, or having surgery to remove part of the thickened nerve. This information is not intended to replace advice given to you by your health care provider. Make sure you discuss any questions you have with your health care provider. Document Revised: 08/11/2020 Document Reviewed: 08/11/2020 Elsevier Patient Education  [place].

## 2021-04-22 NOTE — Progress Notes (Signed)
Subjective: 66 year old female presents the office today for concerns of continued pain to her left foot.  She states the injections are starting to help and she is asking for another injection today.  She is trying to get some similar discomfort on the right foot which she thinks is from compensation.  She denies any recent injury or changes otherwise.    Objective: AAO x3, NAD-presents today wearing flat shoes. There is no area of pinpoint tenderness.  Still have the majority discomfort on the first interspace. There is no discomfort MPJ range of motion or any areas of pinpoint tenderness.  There is no significant edema there is no erythema.  There is no open lesions.  She denies similar discomfort in the right foot there is no specific area of pinpoint tenderness on the right side.  Flexor, extensor tendons appear to be intact.  MMT 5/5. 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  -We discussed both conservative as well as surgical treatment options.  She wants to avoid surgery.  She feels the injections are helping.  We discussed last couple of occasions only doing 1 or 2 more but we will proceed with another injection today if they are starting to make improvements.  Today I prepped the skin with alcohol and 1 cc of dehydrated sclerosing alcohol injection was infiltrated into the first interspace on the area maximal tenderness.  Postinjection care discussed.  She tolerated well. -Continue with offloading bilaterally. If symptoms continue on the right foot, x-ray next appointment.   Return in about 6 weeks (around 05/29/2021).  Trula Slade DPM

## 2021-04-26 DIAGNOSIS — G894 Chronic pain syndrome: Secondary | ICD-10-CM | POA: Diagnosis not present

## 2021-04-29 DIAGNOSIS — J449 Chronic obstructive pulmonary disease, unspecified: Secondary | ICD-10-CM | POA: Diagnosis not present

## 2021-05-09 ENCOUNTER — Encounter: Payer: Self-pay | Admitting: Student in an Organized Health Care Education/Training Program

## 2021-05-09 ENCOUNTER — Ambulatory Visit (HOSPITAL_BASED_OUTPATIENT_CLINIC_OR_DEPARTMENT_OTHER): Payer: 59 | Admitting: Student in an Organized Health Care Education/Training Program

## 2021-05-09 ENCOUNTER — Other Ambulatory Visit: Payer: Self-pay

## 2021-05-09 ENCOUNTER — Ambulatory Visit
Admission: RE | Admit: 2021-05-09 | Discharge: 2021-05-09 | Disposition: A | Discharge status: Home / Self Care | Payer: 59 | Source: Ambulatory Visit | Attending: Student in an Organized Health Care Education/Training Program | Admitting: Student in an Organized Health Care Education/Training Program

## 2021-05-09 DIAGNOSIS — M5416 Radiculopathy, lumbar region: Secondary | ICD-10-CM | POA: Diagnosis not present

## 2021-05-09 DIAGNOSIS — M792 Neuralgia and neuritis, unspecified: Secondary | ICD-10-CM

## 2021-05-09 DIAGNOSIS — G8929 Other chronic pain: Secondary | ICD-10-CM | POA: Insufficient documentation

## 2021-05-09 DIAGNOSIS — G894 Chronic pain syndrome: Secondary | ICD-10-CM | POA: Diagnosis not present

## 2021-05-09 MED ORDER — LIDOCAINE HCL 2 % IJ SOLN
20.0000 mL | Freq: Once | INTRAMUSCULAR | Status: AC
  Administered 2021-05-09: 400 mg

## 2021-05-09 MED ORDER — SODIUM CHLORIDE (PF) 0.9 % IJ SOLN
INTRAMUSCULAR | Status: AC
  Filled 2021-05-09: qty 10

## 2021-05-09 MED ORDER — SODIUM CHLORIDE 0.9% FLUSH
2.0000 mL | Freq: Once | INTRAVENOUS | Status: AC
  Administered 2021-05-09: 10 mL

## 2021-05-09 MED ORDER — LIDOCAINE HCL 2 % IJ SOLN
INTRAMUSCULAR | Status: AC
  Filled 2021-05-09: qty 20

## 2021-05-09 MED ORDER — ROPIVACAINE HCL 2 MG/ML IJ SOLN
INTRAMUSCULAR | Status: AC
  Filled 2021-05-09: qty 20

## 2021-05-09 MED ORDER — DEXAMETHASONE SODIUM PHOSPHATE 10 MG/ML IJ SOLN
10.0000 mg | Freq: Once | INTRAMUSCULAR | Status: AC
  Administered 2021-05-09: 10 mg

## 2021-05-09 MED ORDER — DEXAMETHASONE SODIUM PHOSPHATE 10 MG/ML IJ SOLN
INTRAMUSCULAR | Status: AC
  Filled 2021-05-09: qty 1

## 2021-05-09 MED ORDER — DIAZEPAM 5 MG PO TABS
ORAL_TABLET | ORAL | Status: AC
  Filled 2021-05-09: qty 1

## 2021-05-09 MED ORDER — ROPIVACAINE HCL 2 MG/ML IJ SOLN
2.0000 mL | Freq: Once | INTRAMUSCULAR | Status: AC
  Administered 2021-05-09: 20 mL via EPIDURAL

## 2021-05-09 MED ORDER — IOHEXOL 180 MG/ML  SOLN
10.0000 mL | Freq: Once | INTRAMUSCULAR | Status: AC
  Administered 2021-05-09: 5 mL via EPIDURAL

## 2021-05-09 NOTE — Progress Notes (Signed)
PROVIDER NOTE: Interpretation of information contained herein should be left to medically-trained personnel. Specific patient instructions are provided elsewhere under "Patient Instructions" section of medical record. This document was created in part using STT-dictation technology, any transcriptional errors that may result from this process are unintentional.  Patient: Rachael Jordan Type: Established DOB: Jun 04, 1955 MRN: [id number] PCP: Sharyne Peach, MD  Service: Procedure DOS: 05/09/2021 Setting: Ambulatory Location: Ambulatory outpatient facility Delivery: Face-to-face Provider: Gillis Santa, MD Specialty: Interventional Pain Management Specialty designation: 09 Location: Outpatient facility Ref. Prov.: Gillis Santa, MD    Primary Reason for Visit: Interventional Pain Management Treatment. CC: Back Pain   Procedure:           Type: Lumbar epidural steroid injection (LESI) (interlaminar) #1    Laterality: Right   Level:  L5-S1 Level.  Imaging: Fluoroscopic guidance Anesthesia: Local anesthesia (1-2% Lidocaine) Anxiolysis: Oral Valium 5 mg Sedation:  minimal . DOS: 05/09/2021  Performed by: Gillis Santa, MD  Purpose: Diagnostic/Therapeutic Indications: Lumbar radicular pain of intraspinal etiology of more than 4 weeks that has failed to respond to conservative therapy and is severe enough to impact quality of life or function. 1. Chronic radicular lumbar pain   2. Lumbar radiculopathy   3. Neuropathic pain of left lower extremity   4. Neuropathic pain of right lower extremity   5. Chronic pain syndrome    NAS-11 Pain score:   Pre-procedure: 8 /10   Post-procedure: 8 /10      Position / Prep / Materials:  Position: Prone w/ head of the table raised (slight reverse trendelenburg) to facilitate breathing.  Prep solution: DuraPrep (Iodine Povacrylex [0.7% available iodine] and Isopropyl Alcohol, 74% w/w) Prep Area: Entire Posterior Lumbar Region from lower scapular  tip down to mid buttocks area and from flank to flank. Materials:  Tray: Epidural tray Needle(s):  Type: Epidural needle          Gauge (G):  22 Length: Regular (3.5-in) Qty: 1  Pre-op H&P Assessment:  Rachael Jordan is a 66 y.o. (year old), female patient, seen today for interventional treatment. She  has a past surgical history that includes Appendectomy; Abdominal hysterectomy; Neck surgery; Foot surgery (Left); Colonoscopy with propofol (N/A, 12/29/2018); Esophagogastroduodenoscopy (N/A, 01/02/2020); Colonoscopy (N/A, 01/02/2020); and Breast biopsy. Rachael Jordan has a current medication list which includes the following prescription(s): albuterol, albuterol, amlodipine, aspirin ec, atorvastatin, azelastine, biotin, budesonide-formoterol, cetirizine, cholecalciferol, b-12, diclofenac sodium, gabapentin, hydrocodone-acetaminophen, ipratropium, mirabegron er, montelukast, multiple vitamins-minerals, naloxone, nortriptyline, olmesartan, pantoprazole, pyridoxine, rizatriptan, tizanidine, trazodone, valacyclovir, vitamin c, vitamin e, and oxymorphone, and the following Facility-Administered Medications: betamethasone acetate-betamethasone sodium phosphate. Her primarily concern today is the Back Pain  Initial Vital Signs:  Pulse/HCG Rate:  (!) 101ECG Heart Rate: 95 Temp:  (!) 97.2 F (36.2 C) Resp: 18 BP:  (!) 138/91 SpO2: 100 %  BMI: Estimated body mass index is 26.79 kg/m as calculated from the following:   Height as of this encounter: 5' 5"  (1.651 m).   Weight as of this encounter: 161 lb (73 kg).  Risk Assessment: Allergies: Reviewed. She is allergic to morphine and related, oxymorphone, grapefruit extract, oxycodone, and propoxyphene.  Allergy Precautions: None required Coagulopathies: Reviewed. None identified.  Blood-thinner therapy: None at this time Active Infection(s): Reviewed. None identified. Rachael Jordan is afebrile  Site Confirmation: Rachael Jordan was asked to confirm the  procedure and laterality before marking the site Procedure checklist: Completed Consent: Before the procedure and under the influence of no sedative(s), amnesic(s), or anxiolytics, the patient was  informed of the treatment options, risks and possible complications. To fulfill our ethical and legal obligations, as recommended by the American Medical Association's Code of Ethics, I have informed the patient of my clinical impression; the nature and purpose of the treatment or procedure; the risks, benefits, and possible complications of the intervention; the alternatives, including doing nothing; the risk(s) and benefit(s) of the alternative treatment(s) or procedure(s); and the risk(s) and benefit(s) of doing nothing. The patient was provided information about the general risks and possible complications associated with the procedure. These may include, but are not limited to: failure to achieve desired goals, infection, bleeding, organ or nerve damage, allergic reactions, paralysis, and death. In addition, the patient was informed of those risks and complications associated to Spine-related procedures, such as failure to decrease pain; infection (i.e.: Meningitis, epidural or intraspinal abscess); bleeding (i.e.: epidural hematoma, subarachnoid hemorrhage, or any other type of intraspinal or peri-dural bleeding); organ or nerve damage (i.e.: Any type of peripheral nerve, nerve root, or spinal cord injury) with subsequent damage to sensory, motor, and/or autonomic systems, resulting in permanent pain, numbness, and/or weakness of one or several areas of the body; allergic reactions; (i.e.: anaphylactic reaction); and/or death. Furthermore, the patient was informed of those risks and complications associated with the medications. These include, but are not limited to: allergic reactions (i.e.: anaphylactic or anaphylactoid reaction(s)); adrenal axis suppression; blood sugar elevation that in diabetics may result  in ketoacidosis or comma; water retention that in patients with history of congestive heart failure may result in shortness of breath, pulmonary edema, and decompensation with resultant heart failure; weight gain; swelling or edema; medication-induced neural toxicity; particulate matter embolism and blood vessel occlusion with resultant organ, and/or nervous system infarction; and/or aseptic necrosis of one or more joints. Finally, the patient was informed that Medicine is not an exact science; therefore, there is also the possibility of unforeseen or unpredictable risks and/or possible complications that may result in a catastrophic outcome. The patient indicated having understood very clearly. We have given the patient no guarantees and we have made no promises. Enough time was given to the patient to ask questions, all of which were answered to the patient's satisfaction. Ms. Carberry has indicated that she wanted to continue with the procedure. Attestation: I, the ordering provider, attest that I have discussed with the patient the benefits, risks, side-effects, alternatives, likelihood of achieving goals, and potential problems during recovery for the procedure that I have provided informed consent. Date   Time: 05/09/2021  9:27 AM  Pre-Procedure Preparation:  Monitoring: As per clinic protocol. Respiration, ETCO2, SpO2, BP, heart rate and rhythm monitor placed and checked for adequate function Safety Precautions: Patient was assessed for positional comfort and pressure points before starting the procedure. Time-out: I initiated and conducted the "Time-out" before starting the procedure, as per protocol. The patient was asked to participate by confirming the accuracy of the "Time Out" information. Verification of the correct person, site, and procedure were performed and confirmed by me, the nursing staff, and the patient. "Time-out" conducted as per Joint Commission's Universal Protocol  (UP.01.01.01). Time: 1015  Description/Narrative of Procedure:          Target: Epidural space via interlaminar opening, initially targeting the lower laminar border of the superior vertebral body. Region: Lumbar Approach: Percutaneous paravertebral  Rationale (medical necessity): procedure needed and proper for the diagnosis and/or treatment of the patient's medical symptoms and needs. Procedural Technique Safety Precautions: Aspiration looking for blood return was conducted prior  to all injections. At no point did we inject any substances, as a needle was being advanced. No attempts were made at seeking any paresthesias. Safe injection practices and needle disposal techniques used. Medications properly checked for expiration dates. SDV (single dose vial) medications used. Description of the Procedure: Protocol guidelines were followed. The procedure needle was introduced through the skin, ipsilateral to the reported pain, and advanced to the target area. Bone was contacted and the needle walked caudad, until the lamina was cleared. The epidural space was identified using loss-of-resistance technique with 2-3 ml of PF-NaCl (0.9% NSS), in a 5cc LOR glass syringe.  Vitals:   05/09/21 0932 05/09/21 1015 05/09/21 1020  BP: (!) 138/91 (!) 154/98 (!) 139/98  Pulse: (!) 101    Resp: 18 13 18   Temp: (!) 97.2 F (36.2 C)    TempSrc: Temporal    SpO2: 100% 100% 100%  Weight: 161 lb (73 kg)    Height: 5' 5"  (1.651 m)      5 cc solution made of 2cc of preservative-free saline, 2 cc of 0.2% ropivacaine, 1 cc of Decadron 10 mg/cc.   Start Time: 1015 hrs. End Time: 1019 hrs.  Imaging Guidance (Spinal):          Type of Imaging Technique: Fluoroscopy Guidance (Spinal) Indication(s): Assistance in needle guidance and placement for procedures requiring needle placement in or near specific anatomical locations not easily accessible without such assistance. Exposure Time: Please see nurses  notes. Contrast: Before injecting any contrast, we confirmed that the patient did not have an allergy to iodine, shellfish, or radiological contrast. Once satisfactory needle placement was completed at the desired level, radiological contrast was injected. Contrast injected under live fluoroscopy. No contrast complications. See chart for type and volume of contrast used. Fluoroscopic Guidance: I was personally present during the use of fluoroscopy. "Tunnel Vision Technique" used to obtain the best possible view of the target area. Parallax error corrected before commencing the procedure. "Direction-depth-direction" technique used to introduce the needle under continuous pulsed fluoroscopy. Once target was reached, antero-posterior, oblique, and lateral fluoroscopic projection used confirm needle placement in all planes. Images permanently stored in EMR. Interpretation: I personally interpreted the imaging intraoperatively. Adequate needle placement confirmed in multiple planes. Appropriate spread of contrast into desired area was observed. No evidence of afferent or efferent intravascular uptake. No intrathecal or subarachnoid spread observed. Permanent images saved into the patient's record.  Post-procedure Vital Signs:  Pulse/HCG Rate:  (!) 10193 Temp:  (!) 97.2 F (36.2 C) Resp: 18 BP:  (!) 139/98 SpO2: 100 %  EBL: None  Complications: No immediate post-treatment complications observed by team, or reported by patient.  Note: The patient tolerated the entire procedure well. A repeat set of vitals were taken after the procedure and the patient was kept under observation following institutional policy, for this type of procedure. Post-procedural neurological assessment was performed, showing return to baseline, prior to discharge. The patient was provided with post-procedure discharge instructions, including a section on how to identify potential problems. Should any problems arise concerning  this procedure, the patient was given instructions to immediately contact us, at any time, without hesitation. In any case, we plan to contact the patient by telephone for a follow-up status report regarding this interventional procedure.  Comments:  No additional relevant information.  5 out of 5 strength bilateral lower extremity: Plantar flexion, dorsiflexion, knee flexion, knee extension.   Plan of Care  Orders:  Orders Placed This Encounter  Procedures  DG PA[facility] C-ARM 1-60 MIN NO REPORT    Intraoperative interpretation by procedural physician at [place].    Standing Status:   Standing    Number of Occurrences:   1    Order Specific Question:   Reason for exam:    Answer:   Assistance in needle guidance and placement for procedures requiring needle placement in or near specific anatomical locations not easily accessible without such assistance.    Medications ordered for procedure: Meds ordered this encounter  Medications   iohexol (OMNIPAQUE) 180 MG/ML injection 10 mL    Must be Myelogram-compatible. If not available, you may substitute with a water-soluble, non-ionic, hypoallergenic, myelogram-compatible radiological contrast medium.   lidocaine (XYLOCAINE) 2 % (with pres) injection 400 mg   ropivacaine (PF) 2 mg/mL (0.2%) (NAROPIN) injection 2 mL   sodium chloride flush (NS) 0.9 % injection 2 mL   dexamethasone (DECADRON) injection 10 mg   Medications administered: We administered iohexol, lidocaine, ropivacaine (PF) 2 mg/mL (0.2%), sodium chloride flush, and dexamethasone.  See the medical record for exact dosing, route, and time of administration.  Follow-up plan:   Return in about 4 weeks (around 06/06/2021) for Post Procedure Evaluation, virtual.      Recent Visits Date Type Provider Dept  04/16/21 Office Visit Gillis Santa, MD [facility]  Showing recent visits within past 90 days and meeting all other requirements Today's  Visits Date Type Provider Dept  05/09/21 Procedure visit Gillis Santa, MD [facility]  Showing today's visits and meeting all other requirements Future Appointments Date Type Provider Dept  06/06/21 Appointment Gillis Santa, MD [facility]  Showing future appointments within next 90 days and meeting all other requirements  Disposition: Discharge home  Discharge (Date   Time): 05/09/2021; 1030 hrs.   Primary Care Physician: Sharyne Peach, MD Location: [facility] Note by: Gillis Santa, MD Date: 05/09/2021; Time: 10:29 AM  Disclaimer:  Medicine is not an exact science. The only guarantee in medicine is that nothing is guaranteed. It is important to note that the decision to proceed with this intervention was based on the information collected from the patient. The Data and conclusions were drawn from the patient's questionnaire, the interview, and the physical examination. Because the information was provided in large part by the patient, it cannot be guaranteed that it has not been purposely or unconsciously manipulated. Every effort has been made to obtain as much relevant data as possible for this evaluation. It is important to note that the conclusions that lead to this procedure are derived in large part from the available data. Always take into account that the treatment will also be dependent on availability of resources and existing treatment guidelines, considered by other Pain Management Practitioners as being common knowledge and practice, at the time of the intervention. For Medico-Legal purposes, it is also important to point out that variation in procedural techniques and pharmacological choices are the acceptable norm. The indications, contraindications, technique, and results of the above procedure should only be interpreted and judged by a Board-Certified Interventional Pain Specialist with extensive familiarity and expertise in the  same exact procedure and technique.

## 2021-05-09 NOTE — Patient Instructions (Signed)

## 2021-05-10 ENCOUNTER — Telehealth: Payer: Self-pay

## 2021-05-10 NOTE — Telephone Encounter (Signed)
Called PP Denies any needs at this time. "My back feels fine" instructed to call back if needed.

## 2021-05-11 ENCOUNTER — Other Ambulatory Visit
Admission: RE | Admit: 2021-05-11 | Discharge: 2021-05-11 | Disposition: A | Discharge status: Home / Self Care | Payer: 59 | Source: Ambulatory Visit | Attending: Physician Assistant | Admitting: Physician Assistant

## 2021-05-11 ENCOUNTER — Other Ambulatory Visit: Payer: Self-pay

## 2021-05-11 DIAGNOSIS — Z01812 Encounter for preprocedural laboratory examination: Secondary | ICD-10-CM | POA: Diagnosis not present

## 2021-05-11 DIAGNOSIS — Z20822 Contact with and (suspected) exposure to covid-19: Secondary | ICD-10-CM | POA: Insufficient documentation

## 2021-05-11 LAB — SARS CORONAVIRUS 2 (TAT 6-24 HRS): SARS Coronavirus 2: NEGATIVE

## 2021-05-15 ENCOUNTER — Ambulatory Visit: Payer: 59 | Attending: Neurology

## 2021-05-15 DIAGNOSIS — G4733 Obstructive sleep apnea (adult) (pediatric): Secondary | ICD-10-CM | POA: Diagnosis not present

## 2021-05-16 ENCOUNTER — Other Ambulatory Visit: Payer: Self-pay

## 2021-05-16 DIAGNOSIS — G4733 Obstructive sleep apnea (adult) (pediatric): Secondary | ICD-10-CM | POA: Diagnosis not present

## 2021-05-18 ENCOUNTER — Ambulatory Visit: Payer: 59

## 2021-05-18 ENCOUNTER — Ambulatory Visit: Payer: 59 | Admitting: Podiatry

## 2021-05-18 ENCOUNTER — Other Ambulatory Visit: Payer: Self-pay

## 2021-05-18 DIAGNOSIS — L84 Corns and callosities: Secondary | ICD-10-CM

## 2021-05-18 DIAGNOSIS — M7741 Metatarsalgia, right foot: Secondary | ICD-10-CM | POA: Diagnosis not present

## 2021-05-18 DIAGNOSIS — G5782 Other specified mononeuropathies of left lower limb: Secondary | ICD-10-CM

## 2021-05-18 DIAGNOSIS — M778 Other enthesopathies, not elsewhere classified: Secondary | ICD-10-CM

## 2021-05-18 MED ORDER — TRIAMCINOLONE ACETONIDE 10 MG/ML IJ SUSP
10.0000 mg | Freq: Once | INTRAMUSCULAR | Status: AC
Start: 2021-05-18 — End: 2021-05-18
  Administered 2021-05-18: 10 mg

## 2021-05-18 MED ORDER — AMMONIUM LACTATE 12 % EX LOTN: 1.0000 "application " | TOPICAL_LOTION | CUTANEOUS | 0 refills | Status: DC | PRN

## 2021-05-18 NOTE — Patient Instructions (Signed)

## 2021-05-22 ENCOUNTER — Encounter: Payer: Self-pay | Admitting: Physician Assistant

## 2021-05-27 DIAGNOSIS — J449 Chronic obstructive pulmonary disease, unspecified: Secondary | ICD-10-CM | POA: Diagnosis not present

## 2021-05-27 NOTE — Progress Notes (Signed)
Subjective: ?66 year old female presents the office today for concerns of pain to her right foot.  She is getting similar symptoms on the right foot put on second interspace where she gets the majority discomfort.  She also has a callus to the ball of her foot which causes pain.  She states the injection the left foot have been helpful.  Overall she still does have some discomfort but it has improved compared to previous.  No recent injury or trauma otherwise.  No other concerns.  ? ?Objective: ?AAO x3, NAD-presents today wearing flat shoes. ?On the right foot the majority of tenderness is on the second interspace.  There is no specific area pinpoint tenderness noted.  Hyperkeratotic lesion submetatarsal 3 right foot worse than left.  There is still some mild tenderness on the first interspace of the left foot there is no area of pinpoint tenderness. ?No pain with calf compression, swelling, warmth, erythema ? ?Assessment: ?66 year old female with left first interspace neuroma, improving, right foot capsulitis; hyperkeratotic lesions ? ?Plan: ?-All treatment options discussed with the patient including all alternatives, risks, complications.  ?-X-rays today reviewed.  Bunions present.  No evidence of acute fracture. ?-Steroid injection performed to the right second interspace after discussing risks.  Skin is prepped with alcohol mixture 1 cc Kenalog 10, 0.5 cc of Marcaine plain, 0.5 cc of lidocaine plain was infiltrated along the area of maximal tenderness without complications.  Postinjection care discussed.  Tolerated well. ?-Sharply debrided hyperkeratotic lesions x2 without any complications or bleeding ?-Offloading pads and discussion modifications again today. ?-Held off on injection of the left foot today.  Continue shoe modifications. ?-Prescribed AmLactin ? ?Return in about 4 weeks (around 06/15/2021), or if symptoms worsen or fail to improve. ? ?Trula Slade DPM ? ?

## 2021-05-29 ENCOUNTER — Ambulatory Visit: Payer: 59 | Admitting: Podiatry

## 2021-06-06 ENCOUNTER — Ambulatory Visit
Payer: 59 | Attending: Student in an Organized Health Care Education/Training Program | Admitting: Student in an Organized Health Care Education/Training Program

## 2021-06-06 ENCOUNTER — Other Ambulatory Visit: Payer: Self-pay

## 2021-06-06 ENCOUNTER — Encounter: Payer: Self-pay | Admitting: Student in an Organized Health Care Education/Training Program

## 2021-06-06 DIAGNOSIS — G8929 Other chronic pain: Secondary | ICD-10-CM

## 2021-06-06 DIAGNOSIS — M792 Neuralgia and neuritis, unspecified: Secondary | ICD-10-CM | POA: Diagnosis not present

## 2021-06-06 DIAGNOSIS — G894 Chronic pain syndrome: Secondary | ICD-10-CM | POA: Diagnosis not present

## 2021-06-06 DIAGNOSIS — M5416 Radiculopathy, lumbar region: Secondary | ICD-10-CM

## 2021-06-06 NOTE — Progress Notes (Signed)
Patient: Shariya Gaster  Service Category: E/M  Provider: Gillis Santa, MD  ?DOB: 11-Nov-1955  DOS: 06/06/2021  Location: Office  ?MRN: [id number]  Setting: Ambulatory outpatient  Referring Provider: Sharyne Peach, MD  ?Type: Established Patient  Specialty: Interventional Pain Management  PCP: Sharyne Peach, MD  ?Location: Remote location  Delivery: TeleHealth    ? ?Virtual Encounter - Pain Management ?PROVIDER NOTE: Information contained herein reflects review and annotations entered in association with encounter. Interpretation of such information and data should be left to medically-trained personnel. Information provided to patient can be located elsewhere in the medical record under "Patient Instructions". Document created using STT-dictation technology, any transcriptional errors that may result from process are unintentional.  ?  ?Contact & Pharmacy ?Preferred: [phone] ?Home: [phone] (home) ?Mobile: [phone] (mobile) ?E-mail: [email or web address]  ?[facility], [place] - [address] ?[address] ?[place] [address] ?Phone: [phone] Fax: [phone] ? ?[facility] - [place], [place] ?[address] ?[place] [address] ?Phone: [phone] Fax: [phone] ?  ?Pre-screening  ?Ms. Weilbacher offered "in-person" vs "virtual" encounter. She indicated preferring virtual for this encounter.  ? ?Reason ?COVID-19*  Social distancing based on CDC and AMA recommendations.  ? ?I contacted Earnesteen Kisamore on 06/06/2021 via telephone.      I clearly identified myself as Gillis Santa, MD. I verified that I was speaking with the correct person using two identifiers (Name: Ily Denno, and date of birth: 1955-05-02). ? ?Consent ?I sought verbal advanced consent from Adan Sis for virtual visit interactions. I informed Ms. Brazzle of possible security and privacy concerns, risks, and limitations associated with providing  "not-in-person" medical evaluation and management services. I also informed Ms. Juarez of the availability of "in-person" appointments. Finally, I informed her that there would be a charge for the virtual visit and that she could be  personally, fully or partially, financially responsible for it. Ms. Oien expressed understanding and agreed to proceed.  ? ?Historic Elements   ?Ms. Gray Maugeri is a 66 y.o. year old, female patient evaluated today after our last contact on 05/09/2021. Ms. Ruffini  has a past medical history of Acid reflux, Anxiety, Arthritis, Asthma, Bronchitis (09/2015), CVA (cerebral vascular accident) ([place]), Gross hematuria, H. 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 She also  has a past surgical history that includes Appendectomy; Abdominal hysterectomy; Neck surgery; Foot surgery (Left); Colonoscopy with propofol (N/A, 12/29/2018); Esophagogastroduodenoscopy (N/A, 01/02/2020); Colonoscopy (N/A, 01/02/2020); and Breast biopsy. Ms. Spidle has a current medication list which includes the following prescription(s): albuterol, albuterol, amlodipine, ammonium lactate, aspirin ec, atorvastatin, azelastine, biotin, budesonide-formoterol, cetirizine, cholecalciferol, b-12, diclofenac sodium, gabapentin, hydrocodone-acetaminophen, ipratropium, mirabegron er, montelukast, multiple vitamins-minerals, naloxone, nortriptyline, olmesartan, oxymorphone, pantoprazole, pyridoxine, rizatriptan, tizanidine, trazodone, valacyclovir, vitamin c, and vitamin e, and the following Facility-Administered Medications: betamethasone acetate-betamethasone sodium phosphate. She  reports that she has never smoked. She has never used smokeless tobacco. She reports that she does not drink alcohol and does not use drugs. Ms. Zehner is  allergic to morphine and related, oxymorphone, grapefruit extract, oxycodone, and propoxyphene.  ? ?HPI  ?Today, she is being contacted for a post-procedure assessment. ? ? ?Post-procedure evaluation  ?  Type: Lumbar epidural steroid injection (LESI) (interlaminar) #1    ?Laterality: Right   ?Level:  L5-S1 Level.  ?Imaging: Fluoroscopic  guidance ?Anesthesia: Local anesthesia (1-2% Lidocaine) ?Anxiolysis: Oral Valium 5 mg ?Sedation:  minimal . ?DOS: 05/09/2021  ?Performed by: Gillis Santa, MD ? ?Purpose: Diagnostic/Therapeutic ?Indications: Lumbar radicular pain of intraspinal etiology of more than 4 weeks that has failed to respond to conservative therapy and is severe enough to impact quality of life or function. ?1. Chronic radicular lumbar pain   ?2. Lumbar radiculopathy   ?3. Neuropathic pain of left lower extremity   ?4. Neuropathic pain of right lower extremity   ?5. Chronic pain syndrome   ? ?NAS-11 Pain score:  ? Pre-procedure: 8 /10  ? Post-procedure: 8 /10  ? ?   ?Effectiveness:  ?Initial hour after procedure: 100 %  ?Subsequent 4-6 hours post-procedure: 100 %  ?Analgesia past initial 6 hours: 40 % (back feels better, but right leg continues to hurt.)  ?Ongoing improvement:  ?Analgesic:  40-50% ?Function: Somewhat improved ?ROM: Back to baseline ? ? ?Laboratory Chemistry Profile  ? ?Renal ?Lab Results  ?Component Value Date  ? BUN 5 (L) 01/04/2020  ? CREATININE 1.00 01/04/2020  ? LABCREA 157 12/28/2018  ? BCR 11 (L) 09/29/2017  ? GFRAA 58 (L) 06/18/2019  ? GFRNONAA >60 01/04/2020  ?  Hepatic ?Lab Results  ?Component Value Date  ? AST 19 12/31/2019  ? ALT 14 12/31/2019  ? ALBUMIN 3.7 12/31/2019  ? ALKPHOS 54 12/31/2019  ? LIPASE 25 12/30/2019  ?  ?Electrolytes ?Lab Results  ?Component Value Date  ? NA 143 01/04/2020  ? K 3.3 (L) 01/04/2020  ? CL 107 01/04/2020  ? CALCIUM 8.9 01/04/2020  ? MG 2.1 12/31/2019  ? PHOS 4.5 12/31/2019  ?  Bone ?No results found for: [place], [id number], [id number], [id number],  25OHVITD1, 25OHVITD2, 25OHVITD3, TESTOFREE, TESTOSTERONE  ?Inflammation (CRP: Acute Phase) (ESR: Chronic Phase) ?Lab Results  ?Component Value Date  ? LATICACIDVEN 1.1 12/30/2019  ?    ?  ? ?Note: Above Lab results reviewed. ? ?Assessment  ?The primary encounter diagnosis was Chronic radicular lumbar pain. Diagnoses of Lumbar radiculopathy, Neuropathic pain of right lower extremity, and Chronic pain syndrome were also pertinent to this visit. ? ?Plan of Care  ?Good response to right L5-S1 epidural steroid injection #1.  Endorses improvement in her lower back pain as well as her radiating right leg pain but states that her right leg is still being problematic and is giving her difficulty.  We discussed repeating right L5-S1 epidural steroid injection directing needle more towards the right interlaminar space and injected more volume to see if that covers more of her radiating radicular component.  Risks and benefits reviewed and patient would like to proceed.  We will plan on doing this with IV Versed in clinic. ?Orders:  ?Orders Placed This Encounter  ?Procedures  ? Lumbar Epidural Injection  ?  Standing Status:   Future  ?  Standing Expiration Date:   07/07/2021  ?  Scheduling Instructions:  ?   Procedure: Interlaminar Lumbar Epidural Steroid injection (LESI)         ?   Laterality: Right L5-S1  ?   Sedation: IV Versed  ?   Timeframe: ASAA  ?  Order Specific Question:   Where will this procedure be performed?  ?  Answer:   [facility]  ? ?Follow-up plan:   ?Return in about 5 days (around 06/11/2021) for R L5/S1 ESI #2 , in clinic IV Versed.   ?  ?Right L5/S1 ESI #1 05/09/2021 ?  ?Recent Visits ?Date Type Provider Dept  ?  05/09/21 Procedure visit Gillis Santa, MD [facility]  ?04/16/21 Office Visit Gillis Santa, MD [facility]  ?Showing recent visits within past 90 days and meeting all other requirements ?Today's Visits ?Date Type Provider Dept  ?06/06/21 Office Visit Gillis Santa, MD  [facility]  ?Showing today's visits and meeting all other requirements ?Future Appointments ?No visits were found meeting these conditions. ?Showing future appointments within next 90 days and meeting all o

## 2021-06-09 ENCOUNTER — Emergency Department
Admission: EM | Admit: 2021-06-09 | Discharge: 2021-06-09 | Disposition: A | Discharge status: Home / Self Care | Payer: 59 | Attending: Emergency Medicine | Admitting: Emergency Medicine

## 2021-06-09 ENCOUNTER — Other Ambulatory Visit: Payer: Self-pay

## 2021-06-09 DIAGNOSIS — M79604 Pain in right leg: Secondary | ICD-10-CM | POA: Diagnosis not present

## 2021-06-09 DIAGNOSIS — N189 Chronic kidney disease, unspecified: Secondary | ICD-10-CM | POA: Diagnosis not present

## 2021-06-09 DIAGNOSIS — J449 Chronic obstructive pulmonary disease, unspecified: Secondary | ICD-10-CM | POA: Diagnosis not present

## 2021-06-09 DIAGNOSIS — G8929 Other chronic pain: Secondary | ICD-10-CM | POA: Diagnosis not present

## 2021-06-09 DIAGNOSIS — M5431 Sciatica, right side: Secondary | ICD-10-CM

## 2021-06-09 DIAGNOSIS — M5441 Lumbago with sciatica, right side: Secondary | ICD-10-CM | POA: Diagnosis not present

## 2021-06-09 MED ORDER — LIDOCAINE 5 % EX PTCH: 1.0000 | MEDICATED_PATCH | Freq: Two times a day (BID) | CUTANEOUS | 0 refills | Status: DC

## 2021-06-09 MED ORDER — KETOROLAC TROMETHAMINE 30 MG/ML IJ SOLN
30.0000 mg | Freq: Once | INTRAMUSCULAR | Status: AC
  Administered 2021-06-09: 30 mg via INTRAMUSCULAR
  Filled 2021-06-09: qty 1

## 2021-06-09 MED ORDER — LIDOCAINE 5 % EX PTCH
1.0000 | MEDICATED_PATCH | CUTANEOUS | Status: DC
  Administered 2021-06-09: 1 via TRANSDERMAL
  Filled 2021-06-09: qty 1

## 2021-06-09 NOTE — ED Triage Notes (Signed)
Pt to ED for right leg pain for past 2 weeks. Reports gets pain blocks for back but has not helped leg. Denies recent falls or injuries. No swelling noted. Full movement. States hurts from hip to toes.  ?Took hydrocodone and muscle relaxer PTA ?

## 2021-06-09 NOTE — ED Provider Notes (Signed)
? ?[facility] ?Provider Note ? ? ? Event Date/Time  ? First MD Initiated Contact with Patient 06/09/21 1159   ?  (approximate) ? ? ?History  ? ?Chief Complaint ?Leg Pain ? ? ?HPI ? ?Rachael Jordan is a 66 y.o. female with past medical history of COPD, CKD, migraines, and chronic pain syndrome with chronic right-sided sciatica who presents to the ED complaining of leg pain.  Patient reports that she has been dealing with multiple months of pain starting in her right lower back and radiating down the back of her right leg.  She states the pain got acutely worse over the past 24 hours, particularly in the back of her right thigh.  She was seen by Dr. Holley Raring of pain medicine last week and had a steroid injection performed, which helped with the pain in her back but she states did not alleviate the pain in her leg.  She describes the pain as a sharp and shooting pain exacerbated with movement of the right leg.  She denies any numbness in her groin, has not had any urinary or bowel retention or incontinence.  She has been taking hydrocodone and a muscle relaxant with partial relief, states she previously had relief with a Toradol injection here in the ED. ?  ? ? ?Physical Exam  ? ?Triage Vital Signs: ?ED Triage Vitals  ?Enc Vitals Group  ?   BP 06/09/21 1133 125/87  ?   Pulse Rate 06/09/21 1133 97  ?   Resp 06/09/21 1133 20  ?   Temp 06/09/21 1133 98.6 ?F (37 ?C)  ?   Temp Source 06/09/21 1133 Oral  ?   SpO2 06/09/21 1133 98 %  ?   Weight 06/09/21 1134 163 lb (73.9 kg)  ?   Height 06/09/21 1134 5' 5"  (1.651 m)  ?   Head Circumference --   ?   Peak Flow --   ?   Pain Score 06/09/21 1134 10  ?   Pain Loc --   ?   Pain Edu? --   ?   Excl. in [place]? --   ? ? ?Most recent vital signs: ?Vitals:  ? 06/09/21 1133  ?BP: 125/87  ?Pulse: 97  ?Resp: 20  ?Temp: 98.6 ?F (37 ?C)  ?SpO2: 98%  ? ? ?Constitutional: Alert and oriented. ?Eyes: Conjunctivae are normal. ?Head: Atraumatic. ?Nose: No  congestion/rhinnorhea. ?Mouth/Throat: Mucous membranes are moist.  ?Cardiovascular: Normal rate, regular rhythm. Grossly normal heart sounds.  2+ radial pulses bilaterally.  2+ DP pulses bilaterally. ?Respiratory: Normal respiratory effort.  No retractions. Lungs CTAB. ?Gastrointestinal: Soft and nontender. No distention. ?Musculoskeletal: No lower extremity tenderness nor edema.  ?Neurologic:  Normal speech and language. No gross focal neurologic deficits are appreciated. ? ? ? ?ED Results / Procedures / Treatments  ? ?Labs ?(all labs ordered are listed, but only abnormal results are displayed) ?Labs Reviewed - No data to display ? ? ?PROCEDURES: ? ?Critical Care performed: No ? ?Procedures ? ? ?MEDICATIONS ORDERED IN ED: ?Medications  ?lidocaine (LIDODERM) 5 % 1 patch (1 patch Transdermal Patch Applied 06/09/21 1232)  ?ketorolac (TORADOL) 30 MG/ML injection 30 mg (30 mg Intramuscular Given 06/09/21 1229)  ? ? ? ?IMPRESSION / MDM / ASSESSMENT AND PLAN / ED COURSE  ?I reviewed the triage vital signs and the nursing notes. ?             ?               ? ?  66 y.o. female with past medical history of COPD, CKD, migraines, and chronic pain syndrome who presents to the ED complaining of acute on chronic pain starting in her right lower back and moving down into her right leg over the past 24 hours. ? ?Differential diagnosis includes, but is not limited to, chronic right lumbar radiculopathy, lumbar strain, cauda equina, DVT. ? ?Patient is nontoxic-appearing and in no acute distress, vital signs are unremarkable and she is neurovascular intact to her right lower extremity.  Symptoms are consistent with acute on chronic right lumbar radiculopathy, no findings to suggest cauda equina or DVT.  She is requesting IM Toradol, was given dose here in the ED with improvement in pain.  She is appropriate for discharge home with follow-up in the pain clinic for follow-up steroid injection and physical therapy.  She was counseled to  return to the ED for new worsening symptoms, patient agrees with plan. ? ?  ? ? ?FINAL CLINICAL IMPRESSION(S) / ED DIAGNOSES  ? ?Final diagnoses:  ?Right leg pain  ?Chronic sciatica of right side  ? ? ? ?Rx / DC Orders  ? ?ED Discharge Orders   ? ?      Ordered  ?  lidocaine (LIDODERM) 5 %  Every 12 hours       ? 06/09/21 1320  ? ?  ?  ? ?  ? ? ? ?Note:  This document was prepared using Dragon voice recognition software and may include unintentional dictation errors. ?  ?Blake Divine, MD ?06/09/21 1323 ? ?

## 2021-06-11 ENCOUNTER — Ambulatory Visit: Payer: 59 | Admitting: Physician Assistant

## 2021-06-11 ENCOUNTER — Other Ambulatory Visit: Payer: Self-pay

## 2021-06-11 ENCOUNTER — Encounter: Payer: Self-pay | Admitting: Internal Medicine

## 2021-06-11 ENCOUNTER — Encounter: Payer: Self-pay | Admitting: Physician Assistant

## 2021-06-11 ENCOUNTER — Telehealth: Payer: Self-pay

## 2021-06-11 DIAGNOSIS — G4733 Obstructive sleep apnea (adult) (pediatric): Secondary | ICD-10-CM | POA: Diagnosis not present

## 2021-06-11 DIAGNOSIS — J449 Chronic obstructive pulmonary disease, unspecified: Secondary | ICD-10-CM

## 2021-06-11 DIAGNOSIS — J4520 Mild intermittent asthma, uncomplicated: Secondary | ICD-10-CM | POA: Diagnosis not present

## 2021-06-11 NOTE — Progress Notes (Signed)
[facility] ?[address] ?[place], [place] [address] ? ?Pulmonary Sleep Medicine  ? ?Office Visit Note ? ?Patient Name: Rachael Jordan ?DOB: [birth year] ?MRN [id number] ? ?Date of Service: 06/11/2021 ? ?Complaints/HPI: Pt is here for pulmonary follow up to review sleep study. She had her cpap titration and was recommended to start on cpap at 8cm H2O. Her oxygen levels did not drop while on cpap which is reassuring. After cpap titration felt more rested and is willing to move forward with PAP. ?-Breathing has been ok. Has not had to use neb/albuterol ?-not using symbicort daily, because she has been doing well but knows she should use this daily to prevent exacerbations ?-has not been wearing oxygen at night due to dryness and is looking forward to humidifier on cpap ? ?ROS ? ?General: (-) fever, (-) chills, (-) night sweats, (-) weakness ?Skin: (-) rashes, (-) itching,. ?Eyes: (-) visual changes, (-) redness, (-) itching. ?Nose and Sinuses: (-) nasal stuffiness or itchiness, (-) postnasal drip, (-) nosebleeds, (-) sinus trouble. ?Mouth and Throat: (-) sore throat, (-) hoarseness. ?Neck: (-) swollen glands, (-) enlarged thyroid, (-) neck pain. ?Respiratory: - cough, (-) bloody sputum, - shortness of breath, - wheezing. ?Cardiovascular: - ankle swelling, (-) chest pain. ?Lymphatic: (-) lymph node enlargement. ?Neurologic: (-) numbness, (-) tingling. ?Psychiatric: (-) anxiety, (-) depression ? ? ?Current Medication: ?Outpatient Encounter Medications as of 06/11/2021  ?Medication Sig  ? albuterol (PROVENTIL) (2.5 MG/3ML) 0.083% nebulizer solution Take 2.5 mg by nebulization every 6 (six) hours as needed for wheezing or shortness of breath.  ? albuterol (VENTOLIN HFA) 108 (90 Base) MCG/ACT inhaler Inhale 2 puffs into the lungs every 6 (six) hours as needed for wheezing or shortness of breath.  ? amLODipine (NORVASC) 5 MG tablet Take 5 mg by mouth daily.  ? ammonium lactate (AMLACTIN) 12 % lotion Apply 1  application topically as needed for dry skin.  ? aspirin EC 81 MG tablet Take 81 mg by mouth daily.  ? atorvastatin (LIPITOR) 20 MG tablet Take 20 mg by mouth daily.  ? azelastine (ASTELIN) 0.1 % nasal spray Place 1-2 sprays into both nostrils 2 (two) times daily.   ? Biotin 10000 MCG TABS Take 10 mg by mouth daily.   ? budesonide-formoterol (SYMBICORT) 80-4.5 MCG/ACT inhaler Inhale 2 puffs into the lungs 2 (two) times daily.  ? cetirizine (ZYRTEC) 10 MG tablet Take 10 mg by mouth at bedtime.   ? Cholecalciferol 25 MCG (1000 UT) tablet Take 1,000 Units by mouth daily.   ? Cyanocobalamin (B-12) 1000 MCG TABS Take by mouth daily.  ? diclofenac Sodium (VOLTAREN) 1 % GEL Apply topically.  ? gabapentin (NEURONTIN) 800 MG tablet Take by mouth.  ? HYDROcodone-acetaminophen (NORCO) 10-325 MG tablet Take by mouth.  ? ipratropium (ATROVENT) 0.06 % nasal spray Place into the nose.  ? lidocaine (LIDODERM) 5 % Place 1 patch onto the skin every 12 (twelve) hours. Remove & Discard patch within 12 hours or as directed by MD  ? mirabegron ER (MYRBETRIQ) 50 MG TB24 tablet Take 1 tablet (50 mg total) by mouth daily.  ? montelukast (SINGULAIR) 10 MG tablet Take 10 mg by mouth in the morning.  ? Multiple Vitamins-Minerals (CENTRUM ADULTS PO) Take 1 tablet by mouth daily.   ? naloxone (NARCAN) nasal spray 4 mg/0.1 mL SMARTSIG:Both Nares  ? nortriptyline (PAMELOR) 10 MG capsule Take 10 mg by mouth 2 (two) times daily.  ? olmesartan (BENICAR) 40 MG tablet Take 1 tablet by mouth daily.  ?  oxymorphone (OPANA) 10 MG tablet   ? pantoprazole (PROTONIX) 40 MG tablet Take 40 mg by mouth daily.  ? pyridOXINE (VITAMIN B-6) 100 MG tablet Take 100 mg by mouth daily.  ? rizatriptan (MAXALT) 10 MG tablet Take 10 mg by mouth as needed for migraine. May repeat in 2 hours if needed  ? tiZANidine (ZANAFLEX) 4 MG tablet Take by mouth.  ? traZODone (DESYREL) 100 MG tablet Take 200 mg by mouth at bedtime.   ? valACYclovir (VALTREX) 1000 MG tablet Take 1,000  mg by mouth daily as needed.  ? vitamin C (ASCORBIC ACID) 500 MG tablet Take 500 mg by mouth daily.  ? Vitamin E 45 MG CAPS Take 90 mg by mouth daily.  ? ?Facility-Administered Encounter Medications as of 06/11/2021  ?Medication  ? betamethasone acetate-betamethasone sodium phosphate (CELESTONE) injection 3 mg  ? ? ?Surgical History: ?Past Surgical History:  ?Procedure Laterality Date  ? ABDOMINAL HYSTERECTOMY    ? vaginal  ? APPENDECTOMY    ? BREAST BIOPSY    ? COLONOSCOPY N/A 01/02/2020  ? Procedure: COLONOSCOPY;  Surgeon: Toledo, Benay Pike, MD;  Location: [facility];  Service: Gastroenterology;  Laterality: N/A;  ? COLONOSCOPY WITH PROPOFOL N/A 12/29/2018  ? Procedure: COLONOSCOPY WITH PROPOFOL;  Surgeon: Lucilla Lame, MD;  Location: [facility];  Service: Endoscopy;  Laterality: N/A;  ? ESOPHAGOGASTRODUODENOSCOPY N/A 01/02/2020  ? Procedure: ESOPHAGOGASTRODUODENOSCOPY (EGD);  Surgeon: Toledo, Benay Pike, MD;  Location: [facility];  Service: Gastroenterology;  Laterality: N/A;  ? FOOT SURGERY Left   ? 10/2015  ? NECK SURGERY    ? ? ?Medical History: ?Past Medical History:  ?Diagnosis Date  ? Acid reflux   ? Anxiety   ? Arthritis   ? Asthma   ? Bronchitis 09/2015  ? CVA (cerebral vascular accident) [facility])   ? Gross hematuria   ? H. pylori infection   ? Heart murmur   ? Kidney failure   ? Labile essential hypertension 01/06/2018  ? Lung abnormality   ? "damaged lung due to pneumonia"  ? Neck pain   ? Nephrolithiasis   ? Pneumonia   ? Pneumothorax   ? Scoliosis   ? Seasonal allergies   ? Urinary frequency   ? Vaginal atrophy   ? ? ?Family History: ?Family History  ?Problem Relation Age of Onset  ? Arthritis Mother   ? Asthma Mother   ? Hyperlipidemia Mother   ? Hypertension Mother   ? Diabetes Sister   ? Cancer Maternal Aunt   ?     esophagus  ? Breast cancer Neg Hx   ? Kidney cancer Neg Hx   ? Bladder Cancer Neg Hx   ? ? ?Social History: ?Social History  ? ?Socioeconomic History  ? Marital status: Legally  Separated  ?  Spouse name: Not on file  ? Number of children: Not on file  ? Years of education: Not on file  ? Highest education level: Not on file  ?Occupational History  ? Not on file  ?Tobacco Use  ? Smoking status: Never  ? Smokeless tobacco: Never  ?Vaping Use  ? Vaping Use: Never used  ?Substance and Sexual Activity  ? Alcohol use: No  ?  Alcohol/week: 0.0 standard drinks  ? Drug use: No  ? Sexual activity: Not on file  ?Other Topics Concern  ? Not on file  ?Social History Narrative  ? Not on file  ? ?Social Determinants of Health  ? ?Financial Resource Strain: Not on file  ?  Food Insecurity: Not on file  ?Transportation Needs: Not on file  ?Physical Activity: Not on file  ?Stress: Not on file  ?Social Connections: Not on file  ?Intimate Partner Violence: Not on file  ? ? ?Vital Signs: ?Blood pressure 131/70, pulse 86, temperature 98.4 ?F (36.9 ?C), resp. rate 16, height 5' 5"  (1.651 m), weight 166 lb 9.6 oz (75.6 kg), SpO2 96 %. ? ?Examination: ?General Appearance: The patient is well-developed, well-nourished, and in no distress. ?Skin: Gross inspection of skin unremarkable. ?Head: normocephalic, no gross deformities. ?Eyes: no gross deformities noted. ?ENT: ears appear grossly normal no exudates. ?Neck: Supple. No thyromegaly. No LAD. ?Respiratory: Lungs clear to auscultation bilaterally. ?Cardiovascular: Normal S1 and S2 without murmur or rub. ?Extremities: No cyanosis. pulses are equal. ?Neurologic: Alert and oriented. No involuntary movements. ? ?LABS: ?Recent Results (from the past 2160 hour(s))  ?SARS CORONAVIRUS 2 (TAT 6-24 HRS) Nasopharyngeal Nasopharyngeal Swab     Status: None  ? Collection Time: 03/16/21 10:26 AM  ? Specimen: Nasopharyngeal Swab  ?Result Value Ref Range  ? SARS Coronavirus 2 NEGATIVE NEGATIVE  ?  Comment: (NOTE) ?SARS-CoV-2 target nucleic acids are NOT DETECTED. ? ?The SARS-CoV-2 RNA is generally detectable in upper and lower ?respiratory specimens during the acute phase of  infection. Negative ?results do not preclude SARS-CoV-2 infection, do not rule out ?co-infections with other pathogens, and should not be used as the ?sole basis for treatment or other patient management decision

## 2021-06-11 NOTE — Progress Notes (Deleted)
[facility] ?[address] ?[place], [place] [address] ? ?Internal MEDICINE  ?Office Visit Note ? ?Patient Name: Rachael Jordan ? [id number]  ?[id number] ? ?Date of Service: 06/11/2021 ? ?Chief Complaint  ?Patient presents with  ? Follow-up  ? Gastroesophageal Reflux  ? Hypertension  ? ? ?HPI ? ?Needs cpap  ?-Breathing has been ok. Has not had to use neb/albuterol ?-not using symbicort daily ?-after cpap titration felt more rested ?-has not been wearing oxygen due to dryness ? ?Current Medication: ?Outpatient Encounter Medications as of 06/11/2021  ?Medication Sig  ? albuterol (PROVENTIL) (2.5 MG/3ML) 0.083% nebulizer solution Take 2.5 mg by nebulization every 6 (six) hours as needed for wheezing or shortness of breath.  ? albuterol (VENTOLIN HFA) 108 (90 Base) MCG/ACT inhaler Inhale 2 puffs into the lungs every 6 (six) hours as needed for wheezing or shortness of breath.  ? amLODipine (NORVASC) 5 MG tablet Take 5 mg by mouth daily.  ? ammonium lactate (AMLACTIN) 12 % lotion Apply 1 application topically as needed for dry skin.  ? aspirin EC 81 MG tablet Take 81 mg by mouth daily.  ? atorvastatin (LIPITOR) 20 MG tablet Take 20 mg by mouth daily.  ? azelastine (ASTELIN) 0.1 % nasal spray Place 1-2 sprays into both nostrils 2 (two) times daily.   ? Biotin 10000 MCG TABS Take 10 mg by mouth daily.   ? budesonide-formoterol (SYMBICORT) 80-4.5 MCG/ACT inhaler Inhale 2 puffs into the lungs 2 (two) times daily.  ? cetirizine (ZYRTEC) 10 MG tablet Take 10 mg by mouth at bedtime.   ? Cholecalciferol 25 MCG (1000 UT) tablet Take 1,000 Units by mouth daily.   ? Cyanocobalamin (B-12) 1000 MCG TABS Take by mouth daily.  ? diclofenac Sodium (VOLTAREN) 1 % GEL Apply topically.  ? gabapentin (NEURONTIN) 800 MG tablet Take by mouth.  ? HYDROcodone-acetaminophen (NORCO) 10-325 MG tablet Take by mouth.  ? ipratropium (ATROVENT) 0.06 % nasal spray Place into the nose.  ? lidocaine (LIDODERM) 5 % Place 1 patch onto the skin  every 12 (twelve) hours. Remove & Discard patch within 12 hours or as directed by MD  ? mirabegron ER (MYRBETRIQ) 50 MG TB24 tablet Take 1 tablet (50 mg total) by mouth daily.  ? montelukast (SINGULAIR) 10 MG tablet Take 10 mg by mouth in the morning.  ? Multiple Vitamins-Minerals (CENTRUM ADULTS PO) Take 1 tablet by mouth daily.   ? naloxone (NARCAN) nasal spray 4 mg/0.1 mL SMARTSIG:Both Nares  ? nortriptyline (PAMELOR) 10 MG capsule Take 10 mg by mouth 2 (two) times daily.  ? olmesartan (BENICAR) 40 MG tablet Take 1 tablet by mouth daily.  ? oxymorphone (OPANA) 10 MG tablet   ? pantoprazole (PROTONIX) 40 MG tablet Take 40 mg by mouth daily.  ? pyridOXINE (VITAMIN B-6) 100 MG tablet Take 100 mg by mouth daily.  ? rizatriptan (MAXALT) 10 MG tablet Take 10 mg by mouth as needed for migraine. May repeat in 2 hours if needed  ? tiZANidine (ZANAFLEX) 4 MG tablet Take by mouth.  ? traZODone (DESYREL) 100 MG tablet Take 200 mg by mouth at bedtime.   ? valACYclovir (VALTREX) 1000 MG tablet Take 1,000 mg by mouth daily as needed.  ? vitamin C (ASCORBIC ACID) 500 MG tablet Take 500 mg by mouth daily.  ? Vitamin E 45 MG CAPS Take 90 mg by mouth daily.  ? ?Facility-Administered Encounter Medications as of 06/11/2021  ?Medication  ? betamethasone acetate-betamethasone sodium phosphate (CELESTONE) injection 3 mg  ? ? ?  Surgical History: ?Past Surgical History:  ?Procedure Laterality Date  ? ABDOMINAL HYSTERECTOMY    ? vaginal  ? APPENDECTOMY    ? BREAST BIOPSY    ? COLONOSCOPY N/A 01/02/2020  ? Procedure: COLONOSCOPY;  Surgeon: Toledo, Benay Pike, MD;  Location: [facility] ENDOSCOPY;  Service: Gastroenterology;  Laterality: N/A;  ? COLONOSCOPY WITH PROPOFOL N/A 12/29/2018  ? Procedure: COLONOSCOPY WITH PROPOFOL;  Surgeon: Lucilla Lame, MD;  Location: [facility] ENDOSCOPY;  Service: Endoscopy;  Laterality: N/A;  ? ESOPHAGOGASTRODUODENOSCOPY N/A 01/02/2020  ? Procedure: ESOPHAGOGASTRODUODENOSCOPY (EGD);  Surgeon: Toledo, Benay Pike, MD;   Location: [facility] ENDOSCOPY;  Service: Gastroenterology;  Laterality: N/A;  ? FOOT SURGERY Left   ? 10/2015  ? NECK SURGERY    ? ? ?Medical History: ?Past Medical History:  ?Diagnosis Date  ? Acid reflux   ? Anxiety   ? Arthritis   ? Asthma   ? Bronchitis 09/2015  ? CVA (cerebral vascular accident) [facility])   ? Gross hematuria   ? H. pylori infection   ? Heart murmur   ? Kidney failure   ? Labile essential hypertension 01/06/2018  ? Lung abnormality   ? "damaged lung due to pneumonia"  ? Neck pain   ? Nephrolithiasis   ? Pneumonia   ? Pneumothorax   ? Scoliosis   ? Seasonal allergies   ? Urinary frequency   ? Vaginal atrophy   ? ? ?Family History: ?Family History  ?Problem Relation Age of Onset  ? Arthritis Mother   ? Asthma Mother   ? Hyperlipidemia Mother   ? Hypertension Mother   ? Diabetes Sister   ? Cancer Maternal Aunt   ?     esophagus  ? Breast cancer Neg Hx   ? Kidney cancer Neg Hx   ? Bladder Cancer Neg Hx   ? ? ?Social History  ? ?Socioeconomic History  ? Marital status: Legally Separated  ?  Spouse name: Not on file  ? Number of children: Not on file  ? Years of education: Not on file  ? Highest education level: Not on file  ?Occupational History  ? Not on file  ?Tobacco Use  ? Smoking status: Never  ? Smokeless tobacco: Never  ?Vaping Use  ? Vaping Use: Never used  ?Substance and Sexual Activity  ? Alcohol use: No  ?  Alcohol/week: 0.0 standard drinks  ? Drug use: No  ? Sexual activity: Not on file  ?Other Topics Concern  ? Not on file  ?Social History Narrative  ? Not on file  ? ?Social Determinants of Health  ? ?Financial Resource Strain: Not on file  ?Food Insecurity: Not on file  ?Transportation Needs: Not on file  ?Physical Activity: Not on file  ?Stress: Not on file  ?Social Connections: Not on file  ?Intimate Partner Violence: Not on file  ? ? ? ? ?Review of Systems ? ?Vital Signs: ?BP 131/70   Pulse 86   Temp 98.4 ?F (36.9 ?C)   Resp 16   Ht 5' 5"  (1.651 m)   Wt 166 lb 9.6 oz (75.6 kg)   SpO2  96%   BMI 27.72 kg/m?  ? ? ?Physical Exam ? ? ? ? ?Assessment/Plan: ? ? ?General Counseling: [place] verbalizes understanding of the findings of todays visit and agrees with plan of treatment. I have discussed any further diagnostic evaluation that may be needed or ordered today. We also reviewed her medications today. she has been encouraged to call the office with any questions or  concerns that should arise related to todays visit. ? ? ? ?No orders of the defined types were placed in this encounter. ? ? ?No orders of the defined types were placed in this encounter. ? ? ?This patient was seen by Drema Dallas, PA-C in collaboration with Dr. Clayborn Bigness as a part of collaborative care agreement. ? ? ?Total time spent:*** Minutes ?Time spent includes review of chart, medications, test results, and follow up plan with the patient.  ? ? ? ? ?Dr Lavera Guise ?Internal medicine  ?

## 2021-06-11 NOTE — Patient Instructions (Signed)
Living With Sleep Apnea Sleep apnea is a condition in which breathing pauses or becomes shallow during sleep. Sleep apnea is most commonly caused by a collapsed or blocked airway. People with sleep apnea usually snore loudly. They may have times when they gasp and stop breathing for 10 seconds or more during sleep. This may happen many times during the night. The breaks in breathing also interrupt the deep sleep that you need to feel rested. Even if you do not completely wake up from the gaps in breathing, your sleep may not be restful and you feel tired during the day. You may also have a headache in the morning and low energy during the day, and you may feel anxious or depressed. How can sleep apnea affect me? Sleep apnea increases your chances of extreme tiredness during the day (daytime fatigue). It can also increase your risk for health conditions, such as: Heart attack. Stroke. Obesity. Type 2 diabetes. Heart failure. Irregular heartbeat. High blood pressure. If you have daytime fatigue as a result of sleep apnea, you may be more likely to: Perform poorly at school or work. Fall asleep while driving. Have difficulty with attention. Develop depression or anxiety. Have sexual dysfunction. What actions can I take to manage sleep apnea? Sleep apnea treatment  If you were given a device to open your airway while you sleep, use it only as told by your health care provider. You may be given: An oral appliance. This is a custom-made mouthpiece that shifts your lower jaw forward. A continuous positive airway pressure (CPAP) device. This device blows air through a mask when you breathe out (exhale). A nasal expiratory positive airway pressure (EPAP) device. This device has valves that you put into each nostril. A bi-level positive airway pressure (BIPAP) device. This device blows air through a mask when you breathe in (inhale) and breathe out (exhale). You may need surgery if other treatments  do not work for you. Sleep habits Go to sleep and wake up at the same time every day. This helps set your internal clock (circadian rhythm) for sleeping. If you stay up later than usual, such as on weekends, try to get up in the morning within 2 hours of your normal wake time. Try to get at least 7-9 hours of sleep each night. Stop using a computer, tablet, and mobile phone a few hours before bedtime. Do not take long naps during the day. If you nap, limit it to 30 minutes. Have a relaxing bedtime routine. Reading or listening to music may relax you and help you sleep. Use your bedroom only for sleep. Keep your television and computer out of your bedroom. Keep your bedroom cool, dark, and quiet. Use a supportive mattress and pillows. Follow your health care provider's instructions for other changes to sleep habits. Nutrition Do not eat heavy meals in the evening. Do not have caffeine in the later part of the day. The effects of caffeine can last for more than 5 hours. Follow your health care provider's or dietitian's instructions for any diet changes. Lifestyle   Do not drink alcohol before bedtime. Alcohol can cause you to fall asleep at first, but then it can cause you to wake up in the middle of the night and have trouble getting back to sleep. Do not use any products that contain nicotine or tobacco. These products include cigarettes, chewing tobacco, and vaping devices, such as e-cigarettes. If you need help quitting, ask your health care provider. Medicines Take over-the-counter and   prescription medicines only as told by your health care provider. Do not use over-the-counter sleep medicine. You can become dependent on this medicine, and it can make sleep apnea worse. Do not use medicines, such as sedatives and narcotics, unless told by your health care provider. Activity Exercise on most days, but avoid exercising in the evening. Exercising near bedtime can interfere with  sleeping. If possible, spend time outside every day. Natural light helps regulate your circadian rhythm. General information Lose weight if you need to, and maintain a healthy weight. Keep all follow-up visits. This is important. If you are having surgery, make sure to tell your health care provider that you have sleep apnea. You may need to bring your device with you. Where to find more information Learn more about sleep apnea and daytime fatigue from: American Sleep Association: sleepassociation.org National Sleep Foundation: sleepfoundation.org National Heart, Lung, and Blood Institute: nhlbi.nih.gov Summary Sleep apnea is a condition in which breathing pauses or becomes shallow during sleep. Sleep apnea can cause daytime fatigue and other serious health conditions. You may need to wear a device while sleeping to help keep your airway open. If you are having surgery, make sure to tell your health care provider that you have sleep apnea. You may need to bring your device with you. Making changes to sleep habits, diet, lifestyle, and activity can help you manage sleep apnea. This information is not intended to replace advice given to you by your health care provider. Make sure you discuss any questions you have with your health care provider. Document Revised: 10/11/2020 Document Reviewed: 02/11/2020 Elsevier Patient Education  2022 Elsevier Inc.  

## 2021-06-11 NOTE — Telephone Encounter (Signed)
Sent Rachael Jordan community message that pt needs set up with new CPAP machine. ?

## 2021-06-18 ENCOUNTER — Ambulatory Visit: Payer: 59 | Admitting: Podiatry

## 2021-06-18 DIAGNOSIS — D3610 Benign neoplasm of peripheral nerves and autonomic nervous system, unspecified: Secondary | ICD-10-CM

## 2021-06-18 DIAGNOSIS — D3613 Benign neoplasm of peripheral nerves and autonomic nervous system of lower limb, including hip: Secondary | ICD-10-CM

## 2021-06-18 DIAGNOSIS — M7741 Metatarsalgia, right foot: Secondary | ICD-10-CM

## 2021-06-18 DIAGNOSIS — M778 Other enthesopathies, not elsewhere classified: Secondary | ICD-10-CM

## 2021-06-18 MED ORDER — TRIAMCINOLONE ACETONIDE 10 MG/ML IJ SUSP
10.0000 mg | Freq: Once | INTRAMUSCULAR | Status: AC
  Administered 2021-06-18: 10 mg

## 2021-06-19 NOTE — Progress Notes (Signed)
Subjective: ?66 year old female presents the office today for follow-up evaluation of right foot pain.  She had to proceed with another injection today.  Last 1 was somewhat helpful.  Also she presents with her son and they are interested in possible surgical intervention for the right foot.  She said the left foot has been doing better she wants to hold off any further injections.  She did have to go up on her tiptoes and that is when she had nerve pain.  No recent injuries or changes otherwise.  ? ?Objective: ?AAO x3, NAD-presents today wearing flat shoes. ?On the right foot the majority of tenderness is on the second interspace.  There is no specific area pinpoint tenderness noted.  Bunion, hammertoes present.  Hyperkeratotic lesion submetatarsal 3 right foot worse than left.  There is still some mild tenderness on the first interspace of the left foot there is no area of pinpoint tenderness. ?No pain with calf compression, swelling, warmth, erythema ? ?Assessment: ?66 year old female with left first interspace neuroma, improving, right foot capsulitis; hyperkeratotic lesions ? ?Plan: ?-All treatment options discussed with the patient including all alternatives, risks, complications.  ?-Steroid injection performed to the right second interspace after discussing risks.  Skin is prepped with alcohol mixture 1 cc Kenalog 10, 0.5 cc of Marcaine plain, 0.5 cc of lidocaine plain was infiltrated along the area of maximal tenderness without complications.  Postinjection care discussed.  Tolerated well. ?-I will order MRI of the right foot for further surgical evaluation.  This will be done at [facility]  ?-Sharply debrided hyperkeratotic lesions x2 without any complications or bleeding ?-Continue with AmLactin ? ?Trula Slade DPM ? ?

## 2021-06-20 ENCOUNTER — Encounter: Payer: Self-pay | Admitting: Student in an Organized Health Care Education/Training Program

## 2021-06-20 ENCOUNTER — Ambulatory Visit
Admission: RE | Admit: 2021-06-20 | Discharge: 2021-06-20 | Disposition: A | Discharge status: Home / Self Care | Payer: 59 | Source: Ambulatory Visit | Attending: Student in an Organized Health Care Education/Training Program | Admitting: Student in an Organized Health Care Education/Training Program

## 2021-06-20 ENCOUNTER — Ambulatory Visit (HOSPITAL_BASED_OUTPATIENT_CLINIC_OR_DEPARTMENT_OTHER): Payer: 59 | Admitting: Student in an Organized Health Care Education/Training Program

## 2021-06-20 VITALS — BP 142/76 | Temp 97.2°F | Resp 16 | Ht 65.0 in | Wt 166.0 lb

## 2021-06-20 DIAGNOSIS — M792 Neuralgia and neuritis, unspecified: Secondary | ICD-10-CM | POA: Insufficient documentation

## 2021-06-20 DIAGNOSIS — G8929 Other chronic pain: Secondary | ICD-10-CM | POA: Diagnosis not present

## 2021-06-20 DIAGNOSIS — G894 Chronic pain syndrome: Secondary | ICD-10-CM | POA: Diagnosis not present

## 2021-06-20 DIAGNOSIS — M5416 Radiculopathy, lumbar region: Secondary | ICD-10-CM

## 2021-06-20 MED ORDER — LIDOCAINE HCL 2 % IJ SOLN
20.0000 mL | Freq: Once | INTRAMUSCULAR | Status: AC
  Administered 2021-06-20: 100 mg
  Filled 2021-06-20: qty 40

## 2021-06-20 MED ORDER — DEXAMETHASONE SODIUM PHOSPHATE 10 MG/ML IJ SOLN
10.0000 mg | Freq: Once | INTRAMUSCULAR | Status: AC
  Administered 2021-06-20: 10 mg
  Filled 2021-06-20: qty 1

## 2021-06-20 MED ORDER — IOHEXOL 180 MG/ML  SOLN
10.0000 mL | Freq: Once | INTRAMUSCULAR | Status: AC
  Administered 2021-06-20: 5 mL via EPIDURAL

## 2021-06-20 MED ORDER — MIDAZOLAM HCL 5 MG/5ML IJ SOLN
0.5000 mg | Freq: Once | INTRAMUSCULAR | Status: AC
  Administered 2021-06-20: 1.5 mg via INTRAVENOUS
  Filled 2021-06-20: qty 5

## 2021-06-20 MED ORDER — ROPIVACAINE HCL 2 MG/ML IJ SOLN
2.0000 mL | Freq: Once | INTRAMUSCULAR | Status: AC
  Administered 2021-06-20: 2 mL via EPIDURAL
  Filled 2021-06-20: qty 20

## 2021-06-20 MED ORDER — SODIUM CHLORIDE 0.9% FLUSH
2.0000 mL | Freq: Once | INTRAVENOUS | Status: AC
  Administered 2021-06-20: 2 mL

## 2021-06-20 NOTE — Patient Instructions (Signed)

## 2021-06-20 NOTE — Progress Notes (Signed)
PROVIDER NOTE: Interpretation of information contained herein should be left to medically-trained personnel. Specific patient instructions are provided elsewhere under "Patient Instructions" section of medical record. This document was created in part using STT-dictation technology, any transcriptional errors that may result from this process are unintentional.  ?Patient: Rachael Jordan ?Type: Established ?DOB: 17-Feb-1956 ?MRN: [id number] ?PCP: Sharyne Peach, MD  Service: Procedure ?DOS: 06/20/2021 ?Setting: Ambulatory ?Location: Ambulatory outpatient facility ?Delivery: Face-to-face Provider: Gillis Santa, MD ?Specialty: Interventional Pain Management ?Specialty designation: 09 ?Location: Outpatient facility ?Ref. Prov.: Sharyne Peach, MD   ? ?Primary Reason for Visit: Interventional Pain Management Treatment. ?CC: Back Pain ?  ?Procedure:          ? Type: Lumbar epidural steroid injection (LESI) (interlaminar) #2    ?Laterality: Right   ?Level:  L5-S1 Level.  ?Imaging: Fluoroscopic guidance ?Anesthesia: Local anesthesia (1-2% Lidocaine) ?Anxiolysis: IV Versed  ?Sedation:  minimal . ?DOS: 06/20/2021  ?Performed by: Gillis Santa, MD ? ?Purpose: Diagnostic/Therapeutic ?Indications: Lumbar radicular pain of intraspinal etiology of more than 4 weeks that has failed to respond to conservative therapy and is severe enough to impact quality of life or function. ?1. Chronic radicular lumbar pain   ?2. Lumbar radiculopathy   ?3. Neuropathic pain of right lower extremity   ?4. Neuropathic pain of left lower extremity   ?5. Chronic pain syndrome   ? ?NAS-11 Pain score:  ? Pre-procedure: 8 /10  ? Post-procedure: 8 /10  ? ?  ? ?Position / Prep / Materials:  ?Position: Prone w/ head of the table raised (slight reverse trendelenburg) to facilitate breathing.  ?Prep solution: DuraPrep (Iodine Povacrylex [0.7% available iodine] and Isopropyl Alcohol, 74% w/w) ?Prep Area: Entire Posterior Lumbar Region from lower scapular tip  down to mid buttocks area and from flank to flank. ?Materials:  ?Tray: Epidural tray ?Needle(s):  ?Type: Epidural needle          ?Gauge (G):  22 ?Length: Regular (3.5-in) ?Qty: 1 ? ?Pre-op H&P Assessment:  ?Rachael Jordan is a 66 y.o. (year old), female patient, seen today for interventional treatment. She  has a past surgical history that includes Appendectomy; Abdominal hysterectomy; Neck surgery; Foot surgery (Left); Colonoscopy with propofol (N/A, 12/29/2018); Esophagogastroduodenoscopy (N/A, 01/02/2020); Colonoscopy (N/A, 01/02/2020); and Breast biopsy. Rachael Jordan has a current medication list which includes the following prescription(s): albuterol, albuterol, amlodipine, ammonium lactate, aspirin ec, atorvastatin, azelastine, biotin, budesonide-formoterol, cetirizine, cholecalciferol, b-12, diclofenac sodium, gabapentin, hydrocodone-acetaminophen, ipratropium, lidocaine, mirabegron er, montelukast, multiple vitamins-minerals, naloxone, nortriptyline, olmesartan, oxymorphone, pantoprazole, pyridoxine, rizatriptan, tizanidine, trazodone, valacyclovir, vitamin c, and vitamin e, and the following Facility-Administered Medications: betamethasone acetate-betamethasone sodium phosphate. Her primarily concern today is the Back Pain ? ?Initial Vital Signs:  ?Pulse/HCG Rate:  ? ECG Heart Rate: 89 ?Temp:  ?(!) 97.3 ?F (36.3 ?C) ?Resp: 18 ?BP:  ?(!) 153/91 ?SpO2: 100 % ? ?BMI: Estimated body mass index is 27.62 kg/m? as calculated from the following: ?  Height as of this encounter: 5' 5"  (1.651 m). ?  Weight as of this encounter: 166 lb (75.3 kg). ? ?Risk Assessment: ?Allergies: Reviewed. She is allergic to morphine and related, oxymorphone, grapefruit extract, oxycodone, and propoxyphene.  ?Allergy Precautions: None required ?Coagulopathies: Reviewed. None identified.  ?Blood-thinner therapy: None at this time ?Active Infection(s): Reviewed. None identified. Rachael Jordan is afebrile ? ?Site Confirmation: Rachael Jordan was asked  to confirm the procedure and laterality before marking the site ?Procedure checklist: Completed ?Consent: Before the procedure and under the influence of no sedative(s), amnesic(s), or anxiolytics,  the patient was informed of the treatment options, risks and possible complications. To fulfill our ethical and legal obligations, as recommended by the American Medical Association's Code of Ethics, I have informed the patient of my clinical impression; the nature and purpose of the treatment or procedure; the risks, benefits, and possible complications of the intervention; the alternatives, including doing nothing; the risk(s) and benefit(s) of the alternative treatment(s) or procedure(s); and the risk(s) and benefit(s) of doing nothing. ?The patient was provided information about the general risks and possible complications associated with the procedure. These may include, but are not limited to: failure to achieve desired goals, infection, bleeding, organ or nerve damage, allergic reactions, paralysis, and death. ?In addition, the patient was informed of those risks and complications associated to Spine-related procedures, such as failure to decrease pain; infection (i.e.: Meningitis, epidural or intraspinal abscess); bleeding (i.e.: epidural hematoma, subarachnoid hemorrhage, or any other type of intraspinal or peri-dural bleeding); organ or nerve damage (i.e.: Any type of peripheral nerve, nerve root, or spinal cord injury) with subsequent damage to sensory, motor, and/or autonomic systems, resulting in permanent pain, numbness, and/or weakness of one or several areas of the body; allergic reactions; (i.e.: anaphylactic reaction); and/or death. ?Furthermore, the patient was informed of those risks and complications associated with the medications. These include, but are not limited to: allergic reactions (i.e.: anaphylactic or anaphylactoid reaction(s)); adrenal axis suppression; blood sugar elevation that in  diabetics may result in ketoacidosis or comma; water retention that in patients with history of congestive heart failure may result in shortness of breath, pulmonary edema, and decompensation with resultant heart failure; weight gain; swelling or edema; medication-induced neural toxicity; particulate matter embolism and blood vessel occlusion with resultant organ, and/or nervous system infarction; and/or aseptic necrosis of one or more joints. ?Finally, the patient was informed that Medicine is not an exact science; therefore, there is also the possibility of unforeseen or unpredictable risks and/or possible complications that may result in a catastrophic outcome. The patient indicated having understood very clearly. We have given the patient no guarantees and we have made no promises. Enough time was given to the patient to ask questions, all of which were answered to the patient's satisfaction. Rachael Jordan has indicated that she wanted to continue with the procedure. ?Attestation: I, the ordering provider, attest that I have discussed with the patient the benefits, risks, side-effects, alternatives, likelihood of achieving goals, and potential problems during recovery for the procedure that I have provided informed consent. ?Date  Time: 06/20/2021 11:11 AM ? ?Pre-Procedure Preparation:  ?Monitoring: As per clinic protocol. Respiration, ETCO2, SpO2, BP, heart rate and rhythm monitor placed and checked for adequate function ?Safety Precautions: Patient was assessed for positional comfort and pressure points before starting the procedure. ?Time-out: I initiated and conducted the "Time-out" before starting the procedure, as per protocol. The patient was asked to participate by confirming the accuracy of the "Time Out" information. Verification of the correct person, site, and procedure were performed and confirmed by me, the nursing staff, and the patient. "Time-out" conducted as per Joint Commission's Universal  Protocol (UP.01.01.01). ?Time: 1147 ? ?Description/Narrative of Procedure:          ?Target: Epidural space via interlaminar opening, initially targeting the lower laminar border of the superior vertebral body. ?Regi

## 2021-06-20 NOTE — Progress Notes (Signed)
Safety precautions to be maintained throughout the outpatient stay will include: orient to surroundings, keep bed in low position, maintain call bell within reach at all times, provide assistance with transfer out of bed and ambulation.  

## 2021-06-21 ENCOUNTER — Telehealth: Payer: Self-pay | Admitting: *Deleted

## 2021-06-21 ENCOUNTER — Telehealth: Payer: Self-pay

## 2021-06-21 DIAGNOSIS — G894 Chronic pain syndrome: Secondary | ICD-10-CM | POA: Diagnosis not present

## 2021-06-21 NOTE — Telephone Encounter (Signed)
Patient has been scheduled w/ [facility] for 07/02/21 @6 :00 ,arrival time is 5:30. ?Patient is aware and given contact # if needing to reschedule. ?

## 2021-06-21 NOTE — Telephone Encounter (Signed)
-----   Message from Caron Presume, LPN sent at 03/26/1658  3:39 PM EDT ----- ?Can you follow up on this since I will be gone next week. The number to sch is [phone] ? ?----- Message ----- ?From: Trula Slade, DPM ?Sent: 06/19/2021   7:37 PM EDT ?To: Caron Presume, LPN ? ?I ordered a MRI of the right foot at [facility]. Can you please follow up? Thanks! ? ? ?

## 2021-06-21 NOTE — Telephone Encounter (Signed)
Post procedure phone call. Patient states she is doing good.  

## 2021-06-27 DIAGNOSIS — J449 Chronic obstructive pulmonary disease, unspecified: Secondary | ICD-10-CM | POA: Diagnosis not present

## 2021-06-28 LAB — TOXASSURE SELECT 13 (MW), URINE

## 2021-07-02 ENCOUNTER — Ambulatory Visit
Admission: RE | Admit: 2021-07-02 | Discharge: 2021-07-02 | Disposition: A | Discharge status: Home / Self Care | Payer: 59 | Source: Ambulatory Visit | Attending: Podiatry | Admitting: Podiatry

## 2021-07-02 DIAGNOSIS — M7741 Metatarsalgia, right foot: Secondary | ICD-10-CM | POA: Insufficient documentation

## 2021-07-02 DIAGNOSIS — D3610 Benign neoplasm of peripheral nerves and autonomic nervous system, unspecified: Secondary | ICD-10-CM | POA: Insufficient documentation

## 2021-07-02 DIAGNOSIS — M778 Other enthesopathies, not elsewhere classified: Secondary | ICD-10-CM | POA: Diagnosis not present

## 2021-07-02 DIAGNOSIS — M2011 Hallux valgus (acquired), right foot: Secondary | ICD-10-CM | POA: Diagnosis not present

## 2021-07-02 DIAGNOSIS — G5761 Lesion of plantar nerve, right lower limb: Secondary | ICD-10-CM | POA: Diagnosis not present

## 2021-07-04 ENCOUNTER — Ambulatory Visit (INDEPENDENT_AMBULATORY_CARE_PROVIDER_SITE_OTHER): Payer: 59 | Admitting: Internal Medicine

## 2021-07-04 DIAGNOSIS — J449 Chronic obstructive pulmonary disease, unspecified: Secondary | ICD-10-CM

## 2021-07-04 DIAGNOSIS — J4520 Mild intermittent asthma, uncomplicated: Secondary | ICD-10-CM

## 2021-07-04 DIAGNOSIS — R0602 Shortness of breath: Secondary | ICD-10-CM

## 2021-07-06 ENCOUNTER — Telehealth: Payer: Self-pay | Admitting: *Deleted

## 2021-07-09 ENCOUNTER — Encounter: Payer: Self-pay | Admitting: Student in an Organized Health Care Education/Training Program

## 2021-07-09 ENCOUNTER — Ambulatory Visit
Payer: 59 | Attending: Student in an Organized Health Care Education/Training Program | Admitting: Student in an Organized Health Care Education/Training Program

## 2021-07-09 ENCOUNTER — Other Ambulatory Visit: Payer: Self-pay

## 2021-07-09 VITALS — BP 125/89 | HR 95 | Temp 97.2°F | Resp 16 | Ht 65.0 in | Wt 166.0 lb

## 2021-07-09 DIAGNOSIS — M5416 Radiculopathy, lumbar region: Secondary | ICD-10-CM | POA: Diagnosis not present

## 2021-07-09 DIAGNOSIS — G894 Chronic pain syndrome: Secondary | ICD-10-CM

## 2021-07-09 DIAGNOSIS — G8929 Other chronic pain: Secondary | ICD-10-CM

## 2021-07-09 DIAGNOSIS — M792 Neuralgia and neuritis, unspecified: Secondary | ICD-10-CM

## 2021-07-09 MED ORDER — ALPHA-LIPOIC ACID 600 MG PO CAPS: 600.0000 mg | ORAL_CAPSULE | Freq: Every day | ORAL | 2 refills | Status: AC

## 2021-07-09 NOTE — Patient Instructions (Signed)
______________________________________________________________________ ? ?Preparing for Procedure with Sedation ? ?NOTICE: Due to recent regulatory changes, starting on October 16, 2020, procedures requiring intravenous (IV) sedation will no longer be performed at the [facility].  These types of procedures are required to be performed at [facility] ambulatory surgery facility.  We are very sorry for the inconvenience. ? ?Procedure appointments are limited to planned procedures: ?No Prescription Refills. ?No disability issues will be discussed. ?No medication changes will be discussed. ? ?Instructions: ?Oral Intake: Do not eat or drink anything for at least 8 hours prior to your procedure. (Exception: Blood Pressure Medication. See below.) ?Transportation: A driver is required. You may not drive yourself after the procedure. ?Blood Pressure Medicine: Do not forget to take your blood pressure medicine with a sip of water the morning of the procedure. If your Diastolic (lower reading) is above 100 mmHg, elective cases will be cancelled/rescheduled. ?Blood thinners: These will need to be stopped for procedures. Notify our staff if you are taking any blood thinners. Depending on which one you take, there will be specific instructions on how and when to stop it. ?Diabetics on insulin: Notify the staff so that you can be scheduled 1st case in the morning. If your diabetes requires high dose insulin, take only ? of your normal insulin dose the morning of the procedure and notify the staff that you have done so. ?Preventing infections: Shower with an antibacterial soap the morning of your procedure. ?Build-up your immune system: Take 1000 mg of Vitamin C with every meal (3 times a day) the day prior to your procedure. ?Antibiotics: Inform the staff if you have a condition or reason that requires you to take antibiotics before dental procedures. ?Pregnancy: If you are pregnant, call and cancel the procedure. ?Sickness: If  you have a cold, fever, or any active infections, call and cancel the procedure. ?Arrival: You must be in the facility at least 30 minutes prior to your scheduled procedure. ?Children: Do not bring children with you. ?Dress appropriately: Bring dark clothing that you would not mind if they get stained. ?Valuables: Do not bring any jewelry or valuables. ? ?Reasons to call and reschedule or cancel your procedure: (Following these recommendations will minimize the risk of a serious complication.) ?Surgeries: Avoid having procedures within 2 weeks of any surgery. (Avoid for 2 weeks before or after any surgery). ?Flu Shots: Avoid having procedures within 2 weeks of a flu shots. (Avoid for 2 weeks before or after immunizations). ?Barium: Avoid having a procedure within 7-10 days after having had a radiological study involving the use of radiological contrast. (Myelograms, Barium swallow or enema study). ?Heart attacks: Avoid any elective procedures or surgeries for the initial 6 months after a "Myocardial Infarction" (Heart Attack). ?Blood thinners: It is imperative that you stop these medications before procedures. Let us know if you if you take any blood thinner.  ?Infection: Avoid procedures during or within two weeks of an infection (including chest colds or gastrointestinal problems). Symptoms associated with infections include: Localized redness, fever, chills, night sweats or profuse sweating, burning sensation when voiding, cough, congestion, stuffiness, runny nose, sore throat, diarrhea, nausea, vomiting, cold or Flu symptoms, recent or current infections. It is specially important if the infection is over the area that we intend to treat. ?Heart and lung problems: Symptoms that may suggest an active cardiopulmonary problem include: cough, chest pain, breathing difficulties or shortness of breath, dizziness, ankle swelling, uncontrolled high or unusually low blood pressure, and/or palpitations. If you are  experiencing any of these symptoms, cancel your procedure and contact your primary care physician for an evaluation. ? ?Remember:  ?Regular Business hours are:  ?Monday to Thursday 8:00 AM to 4:00 PM ? ?Provider's Schedule: ?Milinda Pointer, MD:  ?Procedure days: Tuesday and Thursday 7:30 AM to 4:00 PM ? ?Gillis Santa, MD:  ?Procedure days: Monday and Wednesday 7:30 AM to 4:00 PM ?______________________________________________________________________ ? Epidural Steroid Injection ?Patient Information ? ?Description: The epidural space surrounds the nerves as they exit the spinal cord.  In some patients, the nerves can be compressed and inflamed by a bulging disc or a tight spinal canal (spinal stenosis).  By injecting steroids into the epidural space, we can bring irritated nerves into direct contact with a potentially helpful medication.  These steroids act directly on the irritated nerves and can reduce swelling and inflammation which often leads to decreased pain.  Epidural steroids may be injected anywhere along the spine and from the neck to the low back depending upon the location of your pain. ?  After numbing the skin with local anesthetic (like Novocaine), a small needle is passed into the epidural space slowly.  You may experience a sensation of pressure while this is being done.  The entire block usually last less than 10 minutes. ? ?Conditions which may be treated by epidural steroids: ? ?Low back and leg pain ?Neck and arm pain ?Spinal stenosis ?Post-laminectomy syndrome ?Herpes zoster (shingles) pain ?Pain from compression fractures ? ?Preparation for the injection: ? ?Do not eat any solid food or dairy products within 8 hours of your appointment.  ?You may drink clear liquids up to 3 hours before appointment.  Clear liquids include water, black coffee, juice or soda.  No milk or cream please. ?You may take your regular medication, including pain medications, with a sip of water before your  appointment  Diabetics should hold regular insulin (if taken separately) and take 1/2 normal NPH dos the morning of the procedure.  Carry some sugar containing items with you to your appointment. ?A driver must accompany you and be prepared to drive you home after your procedure.  ?Bring all your current medications with your. ?An IV may be inserted and sedation may be given at the discretion of the physician.   ?A blood pressure cuff, EKG and other monitors will often be applied during the procedure.  Some patients may need to have extra oxygen administered for a short period. ?You will be asked to provide medical information, including your allergies, prior to the procedure.  We must know immediately if you are taking blood thinners (like Coumadin/Warfarin)  Or if you are allergic to IV iodine contrast (dye). We must know if you could possible be pregnant. ? ?Possible side-effects: ?Bleeding from needle site ?Infection (rare, may require surgery) ?Nerve injury (rare) ?Numbness & tingling (temporary) ?Difficulty urinating (rare, temporary) ?Spinal headache ( a headache worse with upright posture) ?Light -headedness (temporary) ?Pain at injection site (several days) ?Decreased blood pressure (temporary) ?Weakness in arm/leg (temporary) ?Pressure sensation in back/neck (temporary) ? ?Call if you experience: ?Fever/chills associated with headache or increased back/neck pain. ?Headache worsened by an upright position. ?New onset weakness or numbness of an extremity below the injection site ?Hives or difficulty breathing (go to the emergency room) ?Inflammation or drainage at the infection site ?Severe back/neck pain ?Any new symptoms which are concerning to you ? ?Please note: ? ?Although the local anesthetic injected can often make your back or neck feel good for several hours after  the injection, the pain will likely return.  It takes 3-7 days for steroids to work in the epidural space.  You may not notice any pain  relief for at least that one week. ? ?If effective, we will often do a series of three injections spaced 3-6 weeks apart to maximally decrease your pain.  After the initial series, we generally will wait several months

## 2021-07-09 NOTE — Progress Notes (Signed)
Safety precautions to be maintained throughout the outpatient stay will include: orient to surroundings, keep bed in low position, maintain call bell within reach at all times, provide assistance with transfer out of bed and ambulation.  

## 2021-07-09 NOTE — Progress Notes (Signed)
PROVIDER NOTE: Information contained herein reflects review and annotations entered in association with encounter. Interpretation of such information and data should be left to medically-trained personnel. Information provided to patient can be located elsewhere in the medical record under "Patient Instructions". Document created using STT-dictation technology, any transcriptional errors that may result from process are unintentional.  ?  ?Patient: Rachael Jordan  Service Category: E/M  Provider: Gillis Santa, MD  ?DOB: 04/21/1955  DOS: 07/09/2021  Specialty: Interventional Pain Management  ?MRN: [id number]  Setting: Ambulatory outpatient  PCP: Rachael Peach, MD  ?Type: Established Patient    Referring Provider: Sharyne Peach, MD  ?Location: Office  Delivery: Face-to-face    ? ?HPI  ?[name], a 66 y.o. year old female, is here today because of her Chronic radicular lumbar pain [M54.16, G89.29]. [name] primary complain today is Back Pain (Lumbar bilateral sometimes right is worse ) ?Last encounter: My last encounter with her was on 06/20/2021. ?Pertinent problems: [name] has DDD (degenerative disc disease), lumbar; Chronic radicular lumbar pain; Spinal stenosis, lumbar region, with neurogenic claudication; Back pain, chronic; and Chronic pain syndrome on their pertinent problem list. ?Pain Assessment: Severity of Chronic pain is reported as a 7 /10. Location: Back Lower, Right/ . Onset: More than a month ago. Quality: Aching, Constant, Discomfort. Timing: Constant. Modifying factor(s): medications, some days she takes the gabapentin.  patient would like to know if there is anything comparable to gabapentin, she has side affects from this medication. ?Vitals:  height is 5' 5" (1.651 m) and weight is 166 lb (75.3 kg). Her temporal temperature is 97.2 ?F (36.2 ?C) (abnormal). Her blood pressure is 125/89 and her pulse is 95. Her respiration is 16 and oxygen saturation is 100%.  ? ?Reason for  encounter: post-procedure evaluation and assessment.   ? ? ?Post-procedure evaluation  ? Type: Lumbar epidural steroid injection (LESI) (interlaminar) #2    ?Laterality: Right   ?Level:  L5-S1 Level.  ?Imaging: Fluoroscopic guidance ?Anesthesia: Local anesthesia (1-2% Lidocaine) ?Anxiolysis: IV Versed  ?Sedation:  minimal . ?DOS: 06/20/2021  ?Performed by: Rachael Santa, MD ? ?Purpose: Diagnostic/Therapeutic ?Indications: Lumbar radicular pain of intraspinal etiology of more than 4 weeks that has failed to respond to conservative therapy and is severe enough to impact quality of life or function. ?1. Chronic radicular lumbar pain   ?2. Lumbar radiculopathy   ?3. Neuropathic pain of right lower extremity   ?4. Neuropathic pain of left lower extremity   ?5. Chronic pain syndrome   ? ?NAS-11 Pain score:  ? Pre-procedure: 8 /10  ? Post-procedure: 8 /10  ? ?   ?Effectiveness:  ?Initial hour after procedure: 100 %  ?Subsequent 4-6 hours post-procedure: 100 %  ?Analgesia past initial 6 hours: 100 % (last week she began hurting again)  ?Ongoing improvement:  ?Analgesic:  30-40% ?Function: Somewhat improved ?ROM: Back to baseline ? ? ? ?ROS  ?Constitutional: Denies any fever or chills ?Gastrointestinal: No reported hemesis, hematochezia, vomiting, or acute GI distress ?Musculoskeletal:  Low back pain with radiation into right leg in a dermatomal fashion ?Neurological: No reported episodes of acute onset apraxia, aphasia, dysarthria, agnosia, amnesia, paralysis, loss of coordination, or loss of consciousness ? ?Medication Review  ?Alpha-Lipoic Acid, B-12, Biotin, Cholecalciferol, HYDROcodone-acetaminophen, Multiple Vitamins-Minerals, Vitamin E, albuterol, amLODipine, ammonium lactate, aspirin EC, atorvastatin, azelastine, budesonide-formoterol, cetirizine, diclofenac Sodium, gabapentin, ipratropium, lidocaine, mirabegron ER, montelukast, naloxone, nortriptyline, olmesartan, oxymorphone, pantoprazole, pyridOXINE, rizatriptan,  tiZANidine, traZODone, valACYclovir, and vitamin C ? ?History Review  ?Allergy:  [name] is allergic to morphine and related, oxymorphone, grapefruit extract, oxycodone, and propoxyphene. ?Drug: [name]  reports no history of drug use. ?Alcohol:  reports no history of alcohol use. ?Tobacco:  reports that she has never smoked. She has never used smokeless tobacco. ?Social: [name]  reports that she has never smoked. She has never used smokeless tobacco. She reports that she does not drink alcohol and does not use drugs. ?Medical:  has a past medical history of Acid reflux, Anxiety, Arthritis, Asthma, Bronchitis (09/2015), CVA (cerebral vascular accident) ([place]), Gross hematuria, H. 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 ?Surgical: [name]  has a past surgical history that includes Appendectomy; Abdominal hysterectomy; Neck surgery; Foot surgery (Left); Colonoscopy with propofol (N/A, 12/29/2018); Esophagogastroduodenoscopy (N/A, 01/02/2020); Colonoscopy (N/A, 01/02/2020); and Breast biopsy. ?Family: family history includes Arthritis in her mother; Asthma in her mother; Cancer in her maternal aunt; Diabetes in her sister; Hyperlipidemia in her mother; Hypertension in her mother. ? ?Laboratory Chemistry Profile  ? ?Renal ?Lab Results  ?Component Value Date  ? BUN 5 (L) 01/04/2020  ? CREATININE 1.00 01/04/2020  ? LABCREA 157 12/28/2018  ? BCR 11 (L) 09/29/2017  ? GFRAA 58 (L) 06/18/2019  ? GFRNONAA >60 01/04/2020  ?  Hepatic ?Lab Results  ?Component Value Date  ? AST 19 12/31/2019  ? ALT 14 12/31/2019  ? ALBUMIN 3.7 12/31/2019  ? ALKPHOS 54 12/31/2019  ? LIPASE 25 12/30/2019  ?  ?Electrolytes ?Lab Results  ?Component Value Date  ? NA 143 01/04/2020  ? K 3.3 (L) 01/04/2020  ? CL 107 01/04/2020  ? CALCIUM 8.9 01/04/2020   ? MG 2.1 12/31/2019  ? PHOS 4.5 12/31/2019  ?  Bone ?No results found for: [place], [id number], [id number], [id number], 25OHVITD1, 25OHVITD2, 25OHVITD3, TESTOFREE, TESTOSTERONE  ?Inflammation (CRP: Acute Phase) (ESR: Chronic Phase) ?Lab Results  ?Component Value Date  ? LATICACIDVEN 1.1 12/30/2019  ?    ?  ? ?Note: Above Lab results reviewed. ? ?Recent Imaging Review  ?MR FOOT RIGHT WO CONTRAST ?CLINICAL DATA:  Chronic right forefoot pain.  Evaluate for neuroma. ? ?EXAM: ?MRI OF THE RIGHT FOREFOOT WITHOUT CONTRAST ? ?TECHNIQUE: ?Multiplanar, multisequence MR imaging of the right forefoot was ?performed. No intravenous contrast was administered. ? ?COMPARISON:  Right foot x-rays dated May 18, 2021. ? ?FINDINGS: ?Bones/Joint/Cartilage ? ?No marrow signal abnormality. No fracture or dislocation. Mild ?hallux valgus deformity. Joint spaces are preserved. No joint ?effusion. ? ?Ligaments ? ?Collateral ligaments are intact. ? ?Muscles and Tendons ?Flexor and extensor tendons are intact. No muscle edema or atrophy. ? ?Soft tissue ?1.0 cm T2 hypointense, T1 isointense dumbbell-shaped mass between ?the second and third MTP joints (series 4, images 20-22). ? ?IMPRESSION: ?1. 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 ? ?Electronically Signed ?  By: Titus Dubin M.D. ?  On: 07/02/2021 15:21 ?Note: Reviewed       ? ?Physical Exam  ?General appearance: Well nourished, well developed, and well hydrated. In no apparent acute distress ?Mental status: Alert, oriented x 3 (person, place, & time)       ?Respiratory: No evidence of acute respiratory distress ?Eyes: PERLA ?Vitals: BP 125/89 (BP Location: Left Arm, Patient Position: Sitting, Cuff Size: Normal)   Pulse 95   Temp (!) 97.2 ?F (36.2 ?C) (Temporal)   Resp 16   Ht 5' 5" (1.651 m)   Wt 166  lb (75.3 kg)   SpO2 100%   BMI 27.62 kg/m?  ?BMI: Estimated body mass index is 27.62 kg/m? as calculated from the following: ?  Height as of this encounter: 5' 5"  (1.651 m). ?  Weight as of this encounter: 166 lb (75.3 kg). ?Ideal: Ideal body weight: 57 kg (125 lb 10.6 oz) ?Adjusted ideal body weight: 64.3 kg (141 lb 12.8 oz) ? ?Right low back pain with radiation into righ

## 2021-07-16 DIAGNOSIS — R310 Gross hematuria: Secondary | ICD-10-CM | POA: Diagnosis not present

## 2021-07-16 DIAGNOSIS — N182 Chronic kidney disease, stage 2 (mild): Secondary | ICD-10-CM | POA: Diagnosis not present

## 2021-07-16 DIAGNOSIS — I10 Essential (primary) hypertension: Secondary | ICD-10-CM | POA: Diagnosis not present

## 2021-07-16 DIAGNOSIS — D631 Anemia in chronic kidney disease: Secondary | ICD-10-CM | POA: Diagnosis not present

## 2021-07-18 ENCOUNTER — Telehealth: Payer: Self-pay

## 2021-07-18 NOTE — Procedures (Signed)
[facility] ?[place] ?[place], [address] ? ? ? ?Complete Pulmonary Function Testing Interpretation: ? ?FINDINGS: ? ?The forced vital capacity is mildly decreased.  FEV1 is 1.57 L which is 74% predicted and is mildly decreased.  FEV1 FVC ratio was normal.  Total lung capacity is mildly decreased residual volume is decreased FRC is decreased.  Postbronchodilator no significant change in the FEV1 ? ?IMPRESSION: ? ?This pulmonary function study is suggestive of mild restrictive lung disease clinical correlation is recommended ? ?Allyne Gee, MD FCCP ?Pulmonary Critical Care Medicine ?Sleep Medicine ? ?

## 2021-07-18 NOTE — Telephone Encounter (Signed)
Order for cpap supplies signed by provider and given to [facility] with [place]. ?

## 2021-07-19 ENCOUNTER — Ambulatory Visit: Payer: 59 | Admitting: Internal Medicine

## 2021-07-20 ENCOUNTER — Encounter: Payer: Self-pay | Admitting: Internal Medicine

## 2021-07-20 LAB — PULMONARY FUNCTION TEST

## 2021-07-25 DIAGNOSIS — G4733 Obstructive sleep apnea (adult) (pediatric): Secondary | ICD-10-CM | POA: Diagnosis not present

## 2021-07-26 ENCOUNTER — Encounter: Payer: Self-pay | Admitting: Internal Medicine

## 2021-08-09 DIAGNOSIS — G894 Chronic pain syndrome: Secondary | ICD-10-CM | POA: Diagnosis not present

## 2021-08-10 ENCOUNTER — Telehealth: Payer: Self-pay

## 2021-08-10 NOTE — Telephone Encounter (Signed)
Order for OSA supplies signed by provider and placed in [place] folder.

## 2021-08-14 ENCOUNTER — Other Ambulatory Visit: Payer: Self-pay | Admitting: Physician Assistant

## 2021-08-14 DIAGNOSIS — R0602 Shortness of breath: Secondary | ICD-10-CM

## 2021-08-17 ENCOUNTER — Other Ambulatory Visit: Payer: Self-pay | Admitting: Family Medicine

## 2021-08-17 DIAGNOSIS — Z1231 Encounter for screening mammogram for malignant neoplasm of breast: Secondary | ICD-10-CM

## 2021-08-25 DIAGNOSIS — G4733 Obstructive sleep apnea (adult) (pediatric): Secondary | ICD-10-CM | POA: Diagnosis not present

## 2021-08-30 ENCOUNTER — Ambulatory Visit: Payer: 59 | Admitting: Podiatry

## 2021-08-30 DIAGNOSIS — M778 Other enthesopathies, not elsewhere classified: Secondary | ICD-10-CM | POA: Diagnosis not present

## 2021-08-31 NOTE — Progress Notes (Addendum)
Subjective: 66 year old female presents the office today for f follow-up of bilateral second interspace neuroma.  Right is greater than left side.  She states that she normally gets injections by Dr. Jacqualyn Posey.  She would like to know if I can do the injections.  She denies any other acute complaints  Objective: AAO x3, NAD-presents today wearing flat shoes. On the right foot the majority of tenderness is on the second interspace.  There is no specific area pinpoint tenderness noted.  Bunion, hammertoes present.  No further hyperkeratotic lesion submetatarsal 3 right foot worse than left.   No pain with calf compression, swelling, warmth, erythema  Assessment: 66 year old female with bilateral first interspace neuroma Plan: -All treatment options discussed with the patient including all alternatives, risks, complications.  -Another steroid injection performed to the bilateral second interspace after discussing risks.  Skin is prepped with alcohol mixture 1 cc Kenalog 10, 0.5 cc of Marcaine plain, 0.5 cc of lidocaine plain was infiltrated along the area of maximal tenderness without complications.  Postinjection care discussed.  Tolerated well. -MRI was reviewed with the patient which shows 1 cm Morton's neuroma on the second intermetatarsal space right foot.  I imagine the same issues are on the left side as well Boneta Lucks D.P.M.

## 2021-09-03 DIAGNOSIS — G4733 Obstructive sleep apnea (adult) (pediatric): Secondary | ICD-10-CM | POA: Diagnosis not present

## 2021-09-11 ENCOUNTER — Ambulatory Visit
Admission: RE | Admit: 2021-09-11 | Discharge: 2021-09-11 | Disposition: A | Discharge status: Home / Self Care | Payer: 59 | Source: Ambulatory Visit | Attending: Family Medicine | Admitting: Family Medicine

## 2021-09-11 DIAGNOSIS — Z1231 Encounter for screening mammogram for malignant neoplasm of breast: Secondary | ICD-10-CM | POA: Insufficient documentation

## 2021-09-24 DIAGNOSIS — G4733 Obstructive sleep apnea (adult) (pediatric): Secondary | ICD-10-CM | POA: Diagnosis not present

## 2021-10-03 ENCOUNTER — Ambulatory Visit
Admission: RE | Admit: 2021-10-03 | Discharge: 2021-10-03 | Disposition: A | Discharge status: Home / Self Care | Payer: 59 | Source: Ambulatory Visit | Attending: Student in an Organized Health Care Education/Training Program | Admitting: Student in an Organized Health Care Education/Training Program

## 2021-10-03 ENCOUNTER — Encounter: Payer: Self-pay | Admitting: Student in an Organized Health Care Education/Training Program

## 2021-10-03 ENCOUNTER — Ambulatory Visit
Payer: 59 | Attending: Student in an Organized Health Care Education/Training Program | Admitting: Student in an Organized Health Care Education/Training Program

## 2021-10-03 VITALS — BP 159/93 | HR 89 | Temp 98.4°F | Resp 18 | Ht 65.0 in | Wt 154.0 lb

## 2021-10-03 DIAGNOSIS — M792 Neuralgia and neuritis, unspecified: Secondary | ICD-10-CM

## 2021-10-03 DIAGNOSIS — G4733 Obstructive sleep apnea (adult) (pediatric): Secondary | ICD-10-CM | POA: Diagnosis not present

## 2021-10-03 DIAGNOSIS — G8929 Other chronic pain: Secondary | ICD-10-CM

## 2021-10-03 DIAGNOSIS — M5416 Radiculopathy, lumbar region: Secondary | ICD-10-CM | POA: Diagnosis not present

## 2021-10-03 MED ORDER — IOHEXOL 180 MG/ML  SOLN
INTRAMUSCULAR | Status: AC
  Filled 2021-10-03: qty 20

## 2021-10-03 MED ORDER — LIDOCAINE HCL (PF) 2 % IJ SOLN
INTRAMUSCULAR | Status: AC
  Filled 2021-10-03: qty 10

## 2021-10-03 MED ORDER — LIDOCAINE HCL 2 % IJ SOLN
20.0000 mL | Freq: Once | INTRAMUSCULAR | Status: AC
  Administered 2021-10-03: 100 mg

## 2021-10-03 MED ORDER — DEXAMETHASONE SODIUM PHOSPHATE 10 MG/ML IJ SOLN
10.0000 mg | Freq: Once | INTRAMUSCULAR | Status: AC
  Administered 2021-10-03: 10 mg

## 2021-10-03 MED ORDER — IOHEXOL 180 MG/ML  SOLN
10.0000 mL | Freq: Once | INTRAMUSCULAR | Status: AC
  Administered 2021-10-03: 10 mL via EPIDURAL

## 2021-10-03 MED ORDER — SODIUM CHLORIDE (PF) 0.9 % IJ SOLN
INTRAMUSCULAR | Status: AC
  Filled 2021-10-03: qty 10

## 2021-10-03 MED ORDER — ROPIVACAINE HCL 2 MG/ML IJ SOLN
1.0000 mL | Freq: Once | INTRAMUSCULAR | Status: AC
  Administered 2021-10-03: 1 mL via EPIDURAL

## 2021-10-03 MED ORDER — SODIUM CHLORIDE 0.9% FLUSH
1.0000 mL | Freq: Once | INTRAVENOUS | Status: AC
  Administered 2021-10-03: 1 mL

## 2021-10-03 MED ORDER — ROPIVACAINE HCL 2 MG/ML IJ SOLN
INTRAMUSCULAR | Status: AC
  Filled 2021-10-03: qty 20

## 2021-10-03 MED ORDER — DEXAMETHASONE SODIUM PHOSPHATE 10 MG/ML IJ SOLN
INTRAMUSCULAR | Status: AC
  Filled 2021-10-03: qty 1

## 2021-10-03 NOTE — Patient Instructions (Signed)
Pain Management Discharge Instructions  General Discharge Instructions :  If you need to reach your doctor call: Monday-Friday 8:00 am - 4:00 pm at [phone] or toll free [phone].  After clinic hours [phone] to have operator reach doctor.  Bring all of your medication bottles to all your appointments in the pain clinic.  To cancel or reschedule your appointment with Pain Management please remember to call 24 hours in advance to avoid a fee.  Refer to the educational materials which you have been given on: General Risks, I had my Procedure. Discharge Instructions, Post Sedation.  Post Procedure Instructions:  The drugs you were given will stay in your system until tomorrow, so for the next 24 hours you should not drive, make any legal decisions or drink any alcoholic beverages.  You may eat anything you prefer, but it is better to start with liquids then soups and crackers, and gradually work up to solid foods.  Please notify your doctor immediately if you have any unusual bleeding, trouble breathing or pain that is not related to your normal pain.  Depending on the type of procedure that was done, some parts of your body may feel week and/or numb.  This usually clears up by tonight or the next day.  Walk with the use of an assistive device or accompanied by an adult for the 24 hours.  You may use ice on the affected area for the first 24 hours.  Put ice in a Ziploc bag and cover with a towel and place against area 15 minutes on 15 minutes off.  You may switch to heat after 24 hours.Selective Nerve Root Block Patient Information  Description: Specific nerve roots exit the spinal canal and these nerves can be compressed and inflamed by a bulging disc and bone spurs.  By injecting steroids on the nerve root, we can potentially decrease the inflammation surrounding these nerves, which often leads to decreased pain.  Also, by injecting local anesthesia on the nerve root, this can  provide [place] helpful information to give to your referring doctor if it decreases your pain.  Selective nerve root blocks can be done along the spine from the neck to the low back depending on the location of your pain.   After numbing the skin with local anesthesia, a small needle is passed to the nerve root and the position of the needle is verified using x-ray pictures.  After the needle is in correct position, we then deposit the medication.  You may experience a pressure sensation while this is being done.  The entire block usually lasts less than 15 minutes.  Conditions that may be treated with selective nerve root blocks: Low back and leg pain Spinal stenosis Diagnostic block prior to potential surgery Neck and arm pain Post laminectomy syndrome  Preparation for the injection:  Do not eat any solid food or dairy products within 8 hours of your appointment. You may drink clear liquids up to 3 hours before an appointment.  Clear liquids include water, black coffee, juice or soda.  No milk or cream please. You may take your regular medications, including pain medications, with a sip of water before your appointment.  Diabetics should hold regular insulin (if taken separately) and take 1/2 normal NPH dose the morning of the procedure.  Carry some sugar containing items with you to your appointment. A driver must accompany you and be prepared to drive you home after your procedure. Bring all your current medications with you. An IV  may be inserted and sedation may be given at the discretion of the physician. A blood pressure cuff, EKG, and other monitors will often be applied during the procedure.  Some patients may need to have extra oxygen administered for a short period. You will be asked to provide medical information, including allergies, prior to the procedure.  We must know immediately if you are taking blood  Thinners (like Coumadin) or if you are allergic to IV iodine contrast  (dye).  Possible side-effects: All are usually temporary Bleeding from needle site Light headedness Numbness and tingling Decreased blood pressure Weakness in arms/legs Pressure sensation in back/neck Pain at injection site (several days)  Possible complications: All are extremely rare Infection Nerve injury Spinal headache (a headache wore with upright position)  Call if you experience: Fever/chills associated with headache or increased back/neck pain Headache worsened by an upright position New onset weakness or numbness of an extremity below the injection site Hives or difficulty breathing (go to the emergency room) Inflammation or drainage at the injection site(s) Severe back/neck pain greater than usual New symptoms which are concerning to you  Please note:  Although the local anesthetic injected can often make your back or neck feel good for several hours after the injection the pain will likely return.  It takes 3-5 days for steroids to work on the nerve root. You may not notice any pain relief for at least one week.  If effective, we will often do a series of 3 injections spaced 3-6 weeks apart to maximally decrease your pain.    If you have any questions, please call [phone] [facility]

## 2021-10-03 NOTE — Progress Notes (Signed)
PROVIDER NOTE: Interpretation of information contained herein should be left to medically-trained personnel. Specific patient instructions are provided elsewhere under "Patient Instructions" section of medical record. This document was created in part using STT-dictation technology, any transcriptional errors that may result from this process are unintentional.  Patient: Rachael Jordan Type: Established DOB: 1955-04-11 MRN: [id number] PCP: Sharyne Peach, MD  Service: Procedure DOS: 10/03/2021 Setting: Ambulatory Location: Ambulatory outpatient facility Delivery: Face-to-face Provider: Gillis Santa, MD Specialty: Interventional Pain Management Specialty designation: 09 Location: Outpatient facility Ref. Prov.: Sharyne Peach, MD    Primary Reason for Visit: Interventional Pain Management Treatment. CC: Back Pain (lower)   Procedure:           Type: Lumbar trans-foraminal epidural steroid injection (L-TFESI) #1  Laterality: Left (-LT)  Level: L5 nerve root(s) Imaging: Fluoroscopy-guided Anesthesia: Local anesthesia (1-2% Lidocaine) Anxiolysis: None                 Sedation: None. DOS: 10/03/2021  Performed by: Gillis Santa, MD  Purpose: Diagnostic/Therapeutic Indications: Lumbar radicular pain severe enough to impact quality of life or function. 1. Chronic radicular lumbar pain   2. Lumbar radiculopathy   3. Neuropathic pain of left lower extremity    NAS-11 Pain score:   Pre-procedure: 8 /10   Post-procedure: 8 /10     Position / Prep / Materials:  Position: Prone  Prep solution: DuraPrep (Iodine Povacrylex [0.7% available iodine] and Isopropyl Alcohol, 74% w/w) Prep Area: Entire Posterior Lumbosacral Area.  From the lower tip of the scapula down to the tailbone and from flank to flank. Materials:  Tray: Block Needle(s):  Type: Spinal  Gauge (G): 22  Length: 3.5-in  Qty: 1  Pre-op H&P Assessment:  Rachael Jordan is a 66 y.o. (year old), female patient, seen today  for interventional treatment. She  has a past surgical history that includes Appendectomy; Abdominal hysterectomy; Neck surgery; Foot surgery (Left); Colonoscopy with propofol (N/A, 12/29/2018); Esophagogastroduodenoscopy (N/A, 01/02/2020); Colonoscopy (N/A, 01/02/2020); and Breast biopsy. [name] has a current medication list which includes the following prescription(s): albuterol, albuterol, alpha-lipoic acid, amlodipine, ammonium lactate, aspirin ec, atorvastatin, azelastine, biotin, budesonide-formoterol, cetirizine, cholecalciferol, b-12, diclofenac sodium, gabapentin, hydrocodone-acetaminophen, ipratropium, mirabegron er, montelukast, multiple vitamins-minerals, naloxone, nortriptyline, pantoprazole, pyridoxine, rizatriptan, tizanidine, trazodone, valacyclovir, vitamin c, vitamin e, lidocaine, and oxymorphone, and the following Facility-Administered Medications: betamethasone acetate-betamethasone sodium phosphate. Her primarily concern today is the Back Pain (lower)  Initial Vital Signs:  Pulse/HCG Rate: 89ECG Heart Rate: 87 Temp: 98.4 F (36.9 C) Resp: 16 BP:  (!) 142/83 SpO2: 99 %  BMI: Estimated body mass index is 25.63 kg/m as calculated from the following:   Height as of this encounter: 5' 5"  (1.651 m).   Weight as of this encounter: 154 lb (69.9 kg).  Risk Assessment: Allergies: Reviewed. She is allergic to morphine and related, oxymorphone, grapefruit extract, oxycodone, and propoxyphene.  Allergy Precautions: None required Coagulopathies: Reviewed. None identified.  Blood-thinner therapy: None at this time Active Infection(s): Reviewed. None identified. [name] is afebrile  Site Confirmation: Ms. Alkins was asked to confirm the procedure and laterality before marking the site Procedure checklist: Completed Consent: Before the procedure and under the influence of no sedative(s), amnesic(s), or anxiolytics, the patient was informed of the treatment options, risks and  possible complications. To fulfill our ethical and legal obligations, as recommended by the American Medical Association's Code of Ethics, I have informed the patient of my clinical impression; the nature and purpose of the treatment or  procedure; the risks, benefits, and possible complications of the intervention; the alternatives, including doing nothing; the risk(s) and benefit(s) of the alternative treatment(s) or procedure(s); and the risk(s) and benefit(s) of doing nothing. The patient was provided information about the general risks and possible complications associated with the procedure. These may include, but are not limited to: failure to achieve desired goals, infection, bleeding, organ or nerve damage, allergic reactions, paralysis, and death. In addition, the patient was informed of those risks and complications associated to Spine-related procedures, such as failure to decrease pain; infection (i.e.: Meningitis, epidural or intraspinal abscess); bleeding (i.e.: epidural hematoma, subarachnoid hemorrhage, or any other type of intraspinal or peri-dural bleeding); organ or nerve damage (i.e.: Any type of peripheral nerve, nerve root, or spinal cord injury) with subsequent damage to sensory, motor, and/or autonomic systems, resulting in permanent pain, numbness, and/or weakness of one or several areas of the body; allergic reactions; (i.e.: anaphylactic reaction); and/or death. Furthermore, the patient was informed of those risks and complications associated with the medications. These include, but are not limited to: allergic reactions (i.e.: anaphylactic or anaphylactoid reaction(s)); adrenal axis suppression; blood sugar elevation that in diabetics may result in ketoacidosis or comma; water retention that in patients with history of congestive heart failure may result in shortness of breath, pulmonary edema, and decompensation with resultant heart failure; weight gain; swelling or edema;  medication-induced neural toxicity; particulate matter embolism and blood vessel occlusion with resultant organ, and/or nervous system infarction; and/or aseptic necrosis of one or more joints. Finally, the patient was informed that Medicine is not an exact science; therefore, there is also the possibility of unforeseen or unpredictable risks and/or possible complications that may result in a catastrophic outcome. The patient indicated having understood very clearly. We have given the patient no guarantees and we have made no promises. Enough time was given to the patient to ask questions, all of which were answered to the patient's satisfaction. Ms. Dunn has indicated that she wanted to continue with the procedure. Attestation: I, the ordering provider, attest that I have discussed with the patient the benefits, risks, side-effects, alternatives, likelihood of achieving goals, and potential problems during recovery for the procedure that I have provided informed consent. Date  Time: 10/03/2021 10:31 AM  Pre-Procedure Preparation:  Monitoring: As per clinic protocol. Respiration, ETCO2, SpO2, BP, heart rate and rhythm monitor placed and checked for adequate function Safety Precautions: Patient was assessed for positional comfort and pressure points before starting the procedure. Time-out: I initiated and conducted the "Time-out" before starting the procedure, as per protocol. The patient was asked to participate by confirming the accuracy of the "Time Out" information. Verification of the correct person, site, and procedure were performed and confirmed by me, the nursing staff, and the patient. "Time-out" conducted as per Joint Commission's Universal Protocol (UP.01.01.01). Time: 1115  Description/Narrative of Procedure:          Target: The 6 o'clock position under the pedicle, on the affected side. Region: Posterolateral Lumbosacral Approach: Posterior Percutaneous Paravertebral  approach.  Rationale (medical necessity): procedure needed and proper for the diagnosis and/or treatment of the patient's medical symptoms and needs. Procedural Technique Safety Precautions: Aspiration looking for blood return was conducted prior to all injections. At no point did we inject any substances, as a needle was being advanced. No attempts were made at seeking any paresthesias. Safe injection practices and needle disposal techniques used. Medications properly checked for expiration dates. SDV (single dose vial) medications used. Description of  the Procedure: Protocol guidelines were followed. The patient was placed in position over the procedure table. The target area was identified and the area prepped in the usual manner. Skin & deeper tissues infiltrated with local anesthetic. Appropriate amount of time allowed to pass for local anesthetics to take effect. The procedure needles were then advanced to the target area. Proper needle placement secured. Negative aspiration confirmed. Solution injected in intermittent fashion, asking for systemic symptoms every 0.5cc of injectate. The needles were then removed and the area cleansed, making sure to leave some of the prepping solution back to take advantage of its long term bactericidal properties.  2cc solution made of 1 cc of 0.2% ropivacaine, 1 cc of Decadron 10 mg/cc,  injected after contrast confirmation   Vitals:   10/03/21 1047 10/03/21 1115 10/03/21 1123  BP: (!) 142/83 (!) 149/87 (!) 159/93  Pulse: 89    Resp: 16 18 18   Temp: 98.4 F (36.9 C)    SpO2: 99% 100% 100%  Weight: 154 lb (69.9 kg)    Height: 5' 5"  (9.983 m)      Start Time: 1115 hrs. End Time: 1122 hrs.  Imaging Guidance (Spinal):          Type of Imaging Technique: Fluoroscopy Guidance (Spinal) Indication(s): Assistance in needle guidance and placement for procedures requiring needle placement in or near specific anatomical locations not easily accessible without  such assistance. Exposure Time: Please see nurses notes. Contrast: Before injecting any contrast, we confirmed that the patient did not have an allergy to iodine, shellfish, or radiological contrast. Once satisfactory needle placement was completed at the desired level, radiological contrast was injected. Contrast injected under live fluoroscopy. No contrast complications. See chart for type and volume of contrast used. Fluoroscopic Guidance: I was personally present during the use of fluoroscopy. "Tunnel Vision Technique" used to obtain the best possible view of the target area. Parallax error corrected before commencing the procedure. "Direction-depth-direction" technique used to introduce the needle under continuous pulsed fluoroscopy. Once target was reached, antero-posterior, oblique, and lateral fluoroscopic projection used confirm needle placement in all planes. Images permanently stored in EMR. Interpretation: I personally interpreted the imaging intraoperatively. Adequate needle placement confirmed in multiple planes. Appropriate spread of contrast into desired area was observed. No evidence of afferent or efferent intravascular uptake. No intrathecal or subarachnoid spread observed. Permanent images saved into the patient's record.  Antibiotic Prophylaxis:   Anti-infectives (From admission, onward)    None      Indication(s): None identified  Post-operative Assessment:  Post-procedure Vital Signs:  Pulse/HCG Rate: 8984 Temp: 98.4 F (36.9 C) Resp: 18 BP:  (!) 159/93 SpO2: 100 %  EBL: None  Complications: No immediate post-treatment complications observed by team, or reported by patient.  Note: The patient tolerated the entire procedure well. A repeat set of vitals were taken after the procedure and the patient was kept under observation following institutional policy, for this type of procedure. Post-procedural neurological assessment was performed, showing return to baseline,  prior to discharge. The patient was provided with post-procedure discharge instructions, including a section on how to identify potential problems. Should any problems arise concerning this procedure, the patient was given instructions to immediately contact us, at any time, without hesitation. In any case, we plan to contact the patient by telephone for a follow-up status report regarding this interventional procedure.  Comments:  No additional relevant information.  Plan of Care  Orders:  Orders Placed This Encounter  Procedures   DG  PAIN CLINIC C-ARM 1-60 MIN NO REPORT    Intraoperative interpretation by procedural physician at [place].    Standing Status:   Standing    Number of Occurrences:   1    Order Specific Question:   Reason for exam:    Answer:   Assistance in needle guidance and placement for procedures requiring needle placement in or near specific anatomical locations not easily accessible without such assistance.    Medications ordered for procedure: Meds ordered this encounter  Medications   iohexol (OMNIPAQUE) 180 MG/ML injection 10 mL    Must be Myelogram-compatible. If not available, you may substitute with a water-soluble, non-ionic, hypoallergenic, myelogram-compatible radiological contrast medium.   lidocaine (XYLOCAINE) 2 % (with pres) injection 400 mg   sodium chloride flush (NS) 0.9 % injection 1 mL   ropivacaine (PF) 2 mg/mL (0.2%) (NAROPIN) injection 1 mL   dexamethasone (DECADRON) injection 10 mg   Medications administered: We administered iohexol, lidocaine, sodium chloride flush, ropivacaine (PF) 2 mg/mL (0.2%), and dexamethasone.  See the medical record for exact dosing, route, and time of administration.  Follow-up plan:   Return in about 4 weeks (around 10/31/2021), or PPE Virtual.       Right L5/S1 ESI #1 05/09/2021, Left L5 TF ESI     Recent Visits Date Type Provider Dept  07/09/21 Office Visit Gillis Santa, MD [facility]  Showing recent visits within past 90 days and meeting all other requirements Today's Visits Date Type Provider Dept  10/03/21 Procedure visit Gillis Santa, MD [facility]  Showing today's visits and meeting all other requirements Future Appointments Date Type Provider Dept  11/01/21 Appointment Gillis Santa, MD [facility]  Showing future appointments within next 90 days and meeting all other requirements  Disposition: Discharge home  Discharge (Date  Time): 10/03/2021; 1135 hrs.   Primary Care Physician: Sharyne Peach, MD Location: [facility] Note by: Gillis Santa, MD Date: 10/03/2021; Time: 1:23 PM  Disclaimer:  Medicine is not an Chief Strategy Officer. The only guarantee in medicine is that nothing is guaranteed. It is important to note that the decision to proceed with this intervention was based on the information collected from the patient. The Data and conclusions were drawn from the patient's questionnaire, the interview, and the physical examination. Because the information was provided in large part by the patient, it cannot be guaranteed that it has not been purposely or unconsciously manipulated. Every effort has been made to obtain as much relevant data as possible for this evaluation. It is important to note that the conclusions that lead to this procedure are derived in large part from the available data. Always take into account that the treatment will also be dependent on availability of resources and existing treatment guidelines, considered by other Pain Management Practitioners as being common knowledge and practice, at the time of the intervention. For Medico-Legal purposes, it is also important to point out that variation in procedural techniques and pharmacological choices are the acceptable norm. The indications, contraindications, technique, and results of the above procedure should only be interpreted and judged by a  Board-Certified Interventional Pain Specialist with extensive familiarity and expertise in the same exact procedure and technique.

## 2021-10-03 NOTE — Progress Notes (Signed)
Safety precautions to be maintained throughout the outpatient stay will include: orient to surroundings, keep bed in low position, maintain call bell within reach at all times, provide assistance with transfer out of bed and ambulation.  

## 2021-10-04 ENCOUNTER — Telehealth: Payer: Self-pay

## 2021-10-04 NOTE — Telephone Encounter (Signed)
Post procedure phone call.  Patient states she is doing well.  

## 2021-10-08 ENCOUNTER — Ambulatory Visit (INDEPENDENT_AMBULATORY_CARE_PROVIDER_SITE_OTHER): Payer: 59 | Admitting: Physician Assistant

## 2021-10-08 ENCOUNTER — Encounter: Payer: Self-pay | Admitting: Physician Assistant

## 2021-10-08 VITALS — BP 124/76 | HR 91 | Temp 97.8°F | Resp 16 | Ht 65.0 in | Wt 153.0 lb

## 2021-10-08 DIAGNOSIS — J4520 Mild intermittent asthma, uncomplicated: Secondary | ICD-10-CM

## 2021-10-08 DIAGNOSIS — G4733 Obstructive sleep apnea (adult) (pediatric): Secondary | ICD-10-CM | POA: Diagnosis not present

## 2021-10-08 DIAGNOSIS — Z7189 Other specified counseling: Secondary | ICD-10-CM | POA: Diagnosis not present

## 2021-10-08 NOTE — Progress Notes (Signed)
[facility] [place], [place] [address]  Pulmonary Sleep Medicine   Office Visit Note  Patient Name: Rachael Jordan DOB: [birth year] MRN [id number]  Date of Service: 10/14/2021  Complaints/HPI: Pt is here for routine pulmonary follow up. She had a PFT which showed FEV1 of 1.57L, 74% predicted which is mildly decreased and consistent with mild restrictive lung disease. She was recently set up on CPAP with [facility] less than 2 months ago. She reports she is having nasal and oral dryness. She also reports she is having trouble registering 4 hours. She gets up to restroom several times at night and states she puts mask back on and wears for more than 4 hours total but machine tells her less than that. She needs to have a download. She also may be mouth venting and may need to switch to FF mask. Advised on how to adjust humidity level as well.  ROS  General: (-) fever, (-) chills, (-) night sweats, (-) weakness Skin: (-) rashes, (-) itching,. Eyes: (-) visual changes, (-) redness, (-) itching. Nose and Sinuses: (-) nasal stuffiness or itchiness, (-) postnasal drip, (-) nosebleeds, (-) sinus trouble. Mouth and Throat: (-) sore throat, (-) hoarseness. Neck: (-) swollen glands, (-) enlarged thyroid, (-) neck pain. Respiratory: - cough, (-) bloody sputum, - shortness of breath, - wheezing. Cardiovascular: - ankle swelling, (-) chest pain. Lymphatic: (-) lymph node enlargement. Neurologic: (-) numbness, (-) tingling. Psychiatric: (-) anxiety, (-) depression   Current Medication: Outpatient Encounter Medications as of 10/08/2021  Medication Sig   albuterol (PROVENTIL) (2.5 MG/3ML) 0.083% nebulizer solution Take 2.5 mg by nebulization every 6 (six) hours as needed for wheezing or shortness of breath.   albuterol (VENTOLIN HFA) 108 (90 Base) MCG/ACT inhaler Inhale 2 puffs into the lungs every 6 (six) hours as needed for wheezing or shortness of breath.   amLODipine  (NORVASC) 5 MG tablet Take 5 mg by mouth daily.   ammonium lactate (AMLACTIN) 12 % lotion Apply 1 application topically as needed for dry skin.   aspirin EC 81 MG tablet Take 81 mg by mouth daily.   atorvastatin (LIPITOR) 20 MG tablet Take 20 mg by mouth daily.   azelastine (ASTELIN) 0.1 % nasal spray Place 1-2 sprays into both nostrils 2 (two) times daily.    Biotin 10000 MCG TABS Take 10 mg by mouth daily.    budesonide-formoterol (SYMBICORT) 80-4.5 MCG/ACT inhaler Inhale 2 puffs into the lungs 2 (two) times daily.   cetirizine (ZYRTEC) 10 MG tablet Take 10 mg by mouth at bedtime.    Cholecalciferol 25 MCG (1000 UT) tablet Take 1,000 Units by mouth daily.    Cyanocobalamin (B-12) 1000 MCG TABS Take by mouth daily.   diclofenac Sodium (VOLTAREN) 1 % GEL Apply topically.   gabapentin (NEURONTIN) 800 MG tablet Take 800 mg by mouth daily as needed. Causes hair loss   HYDROcodone-acetaminophen (NORCO) 10-325 MG tablet Take by mouth.   ipratropium (ATROVENT) 0.06 % nasal spray Place into the nose.   lidocaine (LIDODERM) 5 % Place 1 patch onto the skin every 12 (twelve) hours. Remove & Discard patch within 12 hours or as directed by MD   mirabegron ER (MYRBETRIQ) 50 MG TB24 tablet Take 1 tablet (50 mg total) by mouth daily.   montelukast (SINGULAIR) 10 MG tablet Take 10 mg by mouth in the morning.   Multiple Vitamins-Minerals (CENTRUM ADULTS PO) Take 1 tablet by mouth daily.    naloxone (NARCAN) nasal spray 4 mg/0.1 mL  SMARTSIG:Both Nares   nortriptyline (PAMELOR) 10 MG capsule Take 10 mg by mouth 2 (two) times daily.   oxymorphone (OPANA) 10 MG tablet 10 mg every 8 (eight) hours as needed.   pantoprazole (PROTONIX) 40 MG tablet Take 40 mg by mouth daily.   pyridOXINE (VITAMIN B-6) 100 MG tablet Take 100 mg by mouth daily.   rizatriptan (MAXALT) 10 MG tablet Take 10 mg by mouth as needed for migraine. May repeat in 2 hours if needed   tiZANidine (ZANAFLEX) 4 MG tablet Take by mouth.   traZODone  (DESYREL) 100 MG tablet Take 200 mg by mouth at bedtime.    valACYclovir (VALTREX) 1000 MG tablet Take 1,000 mg by mouth daily as needed.   vitamin C (ASCORBIC ACID) 500 MG tablet Take 500 mg by mouth daily.   Vitamin E 45 MG CAPS Take 90 mg by mouth daily.   Facility-Administered Encounter Medications as of 10/08/2021  Medication   betamethasone acetate-betamethasone sodium phosphate (CELESTONE) injection 3 mg    Surgical History: Past Surgical History:  Procedure Laterality Date   ABDOMINAL HYSTERECTOMY     vaginal   APPENDECTOMY     BREAST BIOPSY     COLONOSCOPY N/A 01/02/2020   Procedure: COLONOSCOPY;  Surgeon: Toledo, Benay Pike, MD;  Location: [facility] ENDOSCOPY;  Service: Gastroenterology;  Laterality: N/A;   COLONOSCOPY WITH PROPOFOL N/A 12/29/2018   Procedure: COLONOSCOPY WITH PROPOFOL;  Surgeon: Lucilla Lame, MD;  Location: [facility] ENDOSCOPY;  Service: Endoscopy;  Laterality: N/A;   ESOPHAGOGASTRODUODENOSCOPY N/A 01/02/2020   Procedure: ESOPHAGOGASTRODUODENOSCOPY (EGD);  Surgeon: Toledo, Benay Pike, MD;  Location: [facility] ENDOSCOPY;  Service: Gastroenterology;  Laterality: N/A;   FOOT SURGERY Left    10/2015   NECK SURGERY      Medical History: Past Medical History:  Diagnosis Date   Acid reflux    Anxiety    Arthritis    Asthma    Bronchitis 09/2015   CVA (cerebral vascular accident) ([place])    Gross hematuria    H. pylori infection    Heart murmur    Kidney failure    Labile essential hypertension 01/06/2018   Lung abnormality    "damaged lung due to pneumonia"   Neck pain    Nephrolithiasis    Pneumonia    Pneumothorax    Scoliosis    Seasonal allergies    Urinary frequency    Vaginal atrophy     Family History: Family History  Problem Relation Age of Onset   Arthritis Mother    Asthma Mother    Hyperlipidemia Mother    Hypertension Mother    Diabetes Sister    Cancer Maternal Aunt        esophagus   Breast cancer Neg Hx    Kidney cancer Neg Hx     Bladder Cancer Neg Hx     Social History: Social History   Socioeconomic History   Marital status: Legally Separated    Spouse name: Not on file   Number of children: Not on file   Years of education: Not on file   Highest education level: Not on file  Occupational History   Not on file  Tobacco Use   Smoking status: Never   Smokeless tobacco: Never  Vaping Use   Vaping Use: Never used  Substance and Sexual Activity   Alcohol use: No    Alcohol/week: 0.0 standard drinks of alcohol   Drug use: No   Sexual activity: Not on file  Other Topics Concern  Not on file  Social History Narrative   Not on file   Social Determinants of Health   Financial Resource Strain: Not on file  Food Insecurity: Not on file  Transportation Needs: Not on file  Physical Activity: Not on file  Stress: Not on file  Social Connections: Not on file  Intimate Partner Violence: Not on file    Vital Signs: Blood pressure 124/76, pulse 91, temperature 97.8 F (36.6 C), resp. rate 16, height 5' 5"  (1.651 m), weight 153 lb (69.4 kg), SpO2 95 %.  Examination: General Appearance: The patient is well-developed, well-nourished, and in no distress. Skin: Gross inspection of skin unremarkable. Head: normocephalic, no gross deformities. Eyes: no gross deformities noted. ENT: ears appear grossly normal no exudates. Neck: Supple. No thyromegaly. No LAD. Respiratory: Lungs clear to auscultation bilaterally. Cardiovascular: Normal S1 and S2 without murmur or rub. Extremities: No cyanosis. pulses are equal. Neurologic: Alert and oriented. No involuntary movements.  LABS: Recent Results (from the past 2160 hour(s))  Pulmonary Function Test     Status: None   Collection Time: 07/20/21  1:22 PM  Result Value Ref Range   FEV1     FVC     FEV1/FVC     TLC     DLCO      Radiology: DG PAIN CLINIC C-ARM 1-60 MIN NO REPORT  Result Date: 10/03/2021 Fluoro was used, but no Radiologist interpretation  will be provided. Please refer to "NOTES" tab for provider progress note.   No results found.  DG PAIN CLINIC C-ARM 1-60 MIN NO REPORT  Result Date: 10/03/2021 Fluoro was used, but no Radiologist interpretation will be provided. Please refer to "NOTES" tab for provider progress note.     Assessment and Plan: Patient Active Problem List   Diagnosis Date Noted   Neuropathic pain of right lower extremity 04/16/2021   Ischemic colitis [facility])    Rectal bleeding    Left lower quadrant abdominal pain    Lower GI bleed    Hypotension    Acute colitis 12/30/2019   Lumbar herniated disc 06/16/2019   Acute nonintractable headache    Multiple falls 02/20/2019   Myoclonic jerking 02/20/2019   COPD (chronic obstructive pulmonary disease) ([place]) 02/20/2019   Seasonal allergies 02/20/2019   Insomnia 02/20/2019   Depression with anxiety 02/20/2019   AKI (acute kidney injury) ([place]) 02/20/2019   Dizziness 02/18/2019   Encounter for screening colonoscopy    Bunion 05/06/2018   Metatarsalgia of right foot 05/06/2018   Age-related osteoporosis without current pathological fracture 03/31/2018   False positive ana 03/31/2018   Mouth dryness 02/18/2018   Screening for osteoporosis 02/18/2018   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/2018   Essential hypertension 01/06/2018   Lacunar infarction ([place]) 01/06/2018   Compression fracture of thoracic vertebra, sequela 07/12/2017   Lumbar compression fracture, sequela 07/12/2017   Nocturnal hypoxia 07/12/2017   Simple chronic bronchitis ([place]) 07/12/2017   Intractable back pain 06/05/2017   Overweight (BMI 25.0-29.9) 04/08/2017   Chronic pain syndrome 04/06/2017   Sensory disturbance 01/08/2017   Pain in the groin, right 10/28/2016   Paresthesia of both hands 12/14/2015   B12 deficiency 10/31/2015   SOB (shortness of breath) 08/29/2015   History of hematuria 07/12/2015   Urinary frequency 07/12/2015   Vaginal atrophy 07/12/2015   HAV  (hallux abducto valgus) 07/02/2015   Overlapping toe 07/02/2015   Hammertoe 07/02/2015   LBP (low back pain) 01/17/2015   Trochanteric bursitis of right hip 01/17/2015   PNA (  pneumonia) 12/28/2014   DDD (degenerative disc disease), lumbar 08/15/2014   Chronic radicular lumbar pain 08/15/2014   Spinal stenosis, lumbar region, 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/15/2014   DDD (degenerative disc disease), cervical 08/15/2014   Bilateral occipital neuralgia 08/15/2014   Migraine 08/15/2014   Sacroiliac joint dysfunction 08/15/2014   Back pain, chronic 09/23/2013   Chronic cervical pain 09/23/2013   Arthralgia of temporomandibular joint 08/29/2013   Absolute anemia 08/15/2013   Chronic kidney disease (CKD), stage III (moderate) ([place]) 08/15/2013   Abnormal serum level of alkaline phosphatase 08/15/2013   Blush 08/15/2013   Hematuria, microscopic 08/15/2013   Inflamed nasal mucosa 08/15/2013   Big thyroid 08/15/2013   Disease of thyroid gland 08/15/2013   Positive H. pylori test 08/15/2013   Gastrointestinal ulcer due to Helicobacter pylori 40/81/4481   Other specified bacterial intestinal infections 08/14/2013   Acute kidney injury superimposed on CKD ([place]) 08/09/2013   SIRS (systemic inflammatory response syndrome) ([place]) 08/09/2013   Systemic inflammatory response syndrome (SIRS) ([place]) 08/09/2013    1. OSA (obstructive sleep apnea) Will meet with Claiborne Billings for download and possible mask change. Continue wearing nightly  2. CPAP use counseling CPAP couseling-Discussed importance of adequate CPAP use as well as proper care and cleaning techniques of machine and all supplies.  3. Mild intermittent asthma without complication Continue inhaler as prescribed   General Counseling: I have discussed the findings of the evaluation and examination with Rachael Jordan.  I have also discussed any further diagnostic evaluation thatmay be needed or ordered today. Rachael Jordan verbalizes understanding of the  findings of todays visit. We also reviewed her medications today and discussed drug interactions and side effects including but not limited excessive drowsiness and altered mental states. We also discussed that there is always a risk not just to her but also people around her. she has been encouraged to call the office with any questions or concerns that should arise related to todays visit.  No orders of the defined types were placed in this encounter.    Time spent: 30  I have personally obtained a history, examined the patient, evaluated laboratory and imaging results, formulated the assessment and plan and placed orders. This patient was seen by Drema Dallas, PA-C in collaboration with Dr. Devona Konig as a part of collaborative care agreement.     Allyne Gee, MD [facility] Pulmonary and Critical Care Sleep medicine

## 2021-10-09 ENCOUNTER — Ambulatory Visit (INDEPENDENT_AMBULATORY_CARE_PROVIDER_SITE_OTHER): Payer: 59 | Admitting: Podiatry

## 2021-10-09 DIAGNOSIS — D3610 Benign neoplasm of peripheral nerves and autonomic nervous system, unspecified: Secondary | ICD-10-CM

## 2021-10-09 DIAGNOSIS — D3613 Benign neoplasm of peripheral nerves and autonomic nervous system of lower limb, including hip: Secondary | ICD-10-CM

## 2021-10-09 DIAGNOSIS — M7742 Metatarsalgia, left foot: Secondary | ICD-10-CM | POA: Diagnosis not present

## 2021-10-09 DIAGNOSIS — L84 Corns and callosities: Secondary | ICD-10-CM

## 2021-10-09 DIAGNOSIS — M7741 Metatarsalgia, right foot: Secondary | ICD-10-CM | POA: Diagnosis not present

## 2021-10-09 MED ORDER — TRIAMCINOLONE ACETONIDE 10 MG/ML IJ SUSP
10.0000 mg | Freq: Once | INTRAMUSCULAR | Status: AC
  Administered 2021-10-09: 10 mg

## 2021-10-09 NOTE — Progress Notes (Signed)
Subjective: 66 year old female presents the office with concerns of bilateral foot pain.  She points on the second interspaces where she has a majority discomfort and calluses submetatarsal 3 bilaterally.  She is given going vacation she wants to have relief.  No recent injury or changes otherwise that she reports.  She is also describing tingling and nerve sensations to her feet.  Objective: AAO x3, NAD DP/PT pulses palpable bilaterally, CRT less than 3 seconds There is tenderness along bilateral second interspaces and small palpable neuroma identified.  There is no edema, erythema.  No area pinpoint tenderness.  Prominent metatarsal heads plantarly resulting in hyperkeratotic lesion submetatarsal 3 without any underlying ulceration drainage or any signs of infection.  Bunion present on the right foot. No pain with calf compression, swelling, warmth, erythema  Assessment: Neuroma bilaterally, metatarsalgia  Plan: -All treatment options discussed with the patient including all alternatives, risks, complications.  -Steroid injections performed bilaterally.  Skin was cleaned with alcohol and a mixture of 1 cc Kenalog 10, 0.5 cc of Marcaine plain, 0.5 cc of lidocaine plain was infiltrated into the second interspaces bilaterally without complications.  Postinjection care discussed.  Tolerated well. -Sharp debride the calluses x2 any complications or bleeding as a courtesy.  Recommend moisturizer and offloading.  Dispensed offloading pads. -Continue supportive shoe gear.  Return if symptoms worsen or fail to improve.  Trula Slade DPM

## 2021-10-09 NOTE — Patient Instructions (Signed)

## 2021-10-11 DIAGNOSIS — I10 Essential (primary) hypertension: Secondary | ICD-10-CM | POA: Diagnosis not present

## 2021-10-11 DIAGNOSIS — G894 Chronic pain syndrome: Secondary | ICD-10-CM | POA: Diagnosis not present

## 2021-10-25 DIAGNOSIS — G4733 Obstructive sleep apnea (adult) (pediatric): Secondary | ICD-10-CM | POA: Diagnosis not present

## 2021-10-30 ENCOUNTER — Telehealth: Payer: Self-pay

## 2021-10-30 NOTE — Telephone Encounter (Signed)
LM for patient to call office for pre virtual appointment questions.

## 2021-10-31 ENCOUNTER — Ambulatory Visit: Payer: 59

## 2021-10-31 NOTE — Telephone Encounter (Signed)
Error message

## 2021-11-01 ENCOUNTER — Ambulatory Visit
Payer: 59 | Attending: Student in an Organized Health Care Education/Training Program | Admitting: Student in an Organized Health Care Education/Training Program

## 2021-11-01 ENCOUNTER — Encounter: Payer: Self-pay | Admitting: Student in an Organized Health Care Education/Training Program

## 2021-11-01 DIAGNOSIS — M792 Neuralgia and neuritis, unspecified: Secondary | ICD-10-CM | POA: Diagnosis not present

## 2021-11-01 DIAGNOSIS — G8929 Other chronic pain: Secondary | ICD-10-CM | POA: Diagnosis not present

## 2021-11-01 DIAGNOSIS — M5416 Radiculopathy, lumbar region: Secondary | ICD-10-CM | POA: Diagnosis not present

## 2021-11-01 DIAGNOSIS — G894 Chronic pain syndrome: Secondary | ICD-10-CM | POA: Diagnosis not present

## 2021-11-01 NOTE — Progress Notes (Signed)
Patient: Rachael Jordan  Service Category: E/M  Provider: Gillis Santa, MD  DOB: 02/23/1956  DOS: 11/01/2021  Location: Office  MRN: [id number]  Setting: Ambulatory outpatient  Referring Provider: Sharyne Peach, MD  Type: Established Patient  Specialty: Interventional Pain Management  PCP: Sharyne Peach, MD  Location: Remote location  Delivery: TeleHealth     Virtual Encounter - Pain Management PROVIDER NOTE: Information contained herein reflects review and annotations entered in association with encounter. Interpretation of such information and data should be left to medically-trained personnel. Information provided to patient can be located elsewhere in the medical record under "Patient Instructions". Document created using STT-dictation technology, any transcriptional errors that may result from process are unintentional.    Contact & Pharmacy Preferred: [phone] Home: [phone] (home) Mobile: [phone] (mobile) E-mail: [email or web address], [place] [address] Phone: [phone] Fax: [phone]  [facility] - [place]ia Buff, [place] [address] [place] [address] Phone: [phone] Fax: [phone]   Pre-screening  Ms. Weinel offered "in-person" vs "virtual" encounter. She indicated preferring virtual for this encounter.   Reason COVID-19*  Social distancing based on CDC and AMA recommendations.   I contacted Tanja Livia Snellen on 11/01/2021 via telephone.      I clearly identified myself as Gillis Santa, MD. I verified that I was speaking with the correct person using two identifiers (Name: Kris Burd, and date of birth: [birth year]).  Consent I sought verbal advanced consent from Adan Sis for virtual visit interactions. I informed Ms. Hauser of possible security and privacy concerns, risks, and limitations associated with providing  "not-in-person" medical evaluation and management services. I also informed Ms. Bhola of the availability of "in-person" appointments. Finally, I informed her that there would be a charge for the virtual visit and that she could be  personally, fully or partially, financially responsible for it. Ms. Yarbough expressed understanding and agreed to proceed.   Historic Elements   Ms. Mandolin Falwell is a 66 y.o. year old, female patient evaluated today after our last contact on 10/03/2021. Ms. Valenza  has a past medical history of Acid reflux, Anxiety, Arthritis, Asthma, Bronchitis (09/2015), CVA (cerebral vascular accident) ([place]), Gross hematuria, H. 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 She also  has a past surgical history that includes Appendectomy; Abdominal hysterectomy; Neck surgery; Foot surgery (Left); Colonoscopy with propofol (N/A, 12/29/2018); Esophagogastroduodenoscopy (N/A, 01/02/2020); Colonoscopy (N/A, 01/02/2020); and Breast biopsy. Ms. Welford has a current medication list which includes the following prescription(s): albuterol, albuterol, amlodipine, ammonium lactate, aspirin ec, atorvastatin, azelastine, biotin, budesonide-formoterol, cetirizine, cholecalciferol, b-12, diclofenac sodium, gabapentin, hydrocodone-acetaminophen, ipratropium, mirabegron er, montelukast, multiple vitamins-minerals, naloxone, nortriptyline, oxymorphone, pantoprazole, pyridoxine, rizatriptan, tizanidine, trazodone, valacyclovir, ascorbic acid, vitamin e, and lidocaine, and the following Facility-Administered Medications: betamethasone acetate-betamethasone sodium phosphate. She  reports that she has never smoked. She has never used smokeless tobacco. She reports that she does not drink alcohol and does not use drugs. Ms. Helbling is  allergic to morphine and related, oxymorphone, grapefruit extract, oxycodone, and propoxyphene.   HPI  Today, she is being contacted for a post-procedure assessment.   Post-procedure evaluation   Type: Lumbar trans-foraminal epidural steroid injection (L-TFESI) #1  Laterality: Left (-LT)  Level: L5 nerve root(s) Imaging: Fluoroscopy-guided Anesthesia: Local anesthesia (1-2%  Lidocaine) Anxiolysis: None                 Sedation: None. DOS: 10/03/2021  Performed by: Gillis Santa, MD  Purpose: Diagnostic/Therapeutic Indications: Lumbar radicular pain severe enough to impact quality of life or function. 1. Chronic radicular lumbar pain   2. Lumbar radiculopathy   3. Neuropathic pain of left lower extremity    NAS-11 Pain score:   Pre-procedure: 8 /10   Post-procedure: 8 /10      Effectiveness:  Initial hour after procedure: 30 %  Subsequent 4-6 hours post-procedure: 30 %  Analgesia past initial 6 hours: 45 % (lsdyrf 3 weeks)  Ongoing improvement:  Analgesic:  <20% Function: Somewhat improved ROM: Back to baseline   Laboratory Chemistry Profile   Renal Lab Results  Component Value Date   BUN 5 (L) 01/04/2020   CREATININE 1.00 01/04/2020   LABCREA 157 12/28/2018   BCR 11 (L) 09/29/2017   GFRAA 58 (L) 06/18/2019   GFRNONAA >60 01/04/2020    Hepatic Lab Results  Component Value Date   AST 19 12/31/2019   ALT 14 12/31/2019   ALBUMIN 3.7 12/31/2019   ALKPHOS 54 12/31/2019   LIPASE 25 12/30/2019    Electrolytes Lab Results  Component Value Date   NA 143 01/04/2020   K 3.3 (L) 01/04/2020   CL 107 01/04/2020   CALCIUM 8.9 01/04/2020   MG 2.1 12/31/2019   PHOS 4.5 12/31/2019    Bone No results found for: "VD25OH", "VD125OH2TOT", "TD4287GO1", "LX7262MB5", "25OHVITD1", "25OHVITD2", "25OHVITD3", "TESTOFREE", "TESTOSTERONE"  Inflammation (CRP: Acute Phase) (ESR: Chronic Phase) Lab Results  Component Value Date   LATICACIDVEN 1.1 12/30/2019         Note: Above  Lab results reviewed.  Imaging  DG PAIN CLINIC C-ARM 1-60 MIN NO REPORT Fluoro was used, but no Radiologist interpretation will be provided.  Please refer to "NOTES" tab for provider progress note.  Assessment  The primary encounter diagnosis was Chronic radicular lumbar pain. Diagnoses of Lumbar radiculopathy, Neuropathic pain of left lower extremity, Neuropathic pain of right lower extremity, and Chronic pain syndrome were also pertinent to this visit.  Plan of Care   Status post left L5 transforaminal ESI that provided approximately 45% pain relief for about 3 weeks.  Now she is having return of her left radicular pain.  Of note she is status post [employer] spinal cord stimulator trial which was not effective for her pain. She was previously seen by Dr. Belenda Cruise crisp and has failed multiple opioid trials even high-dose.  Do not recommend chronic opioid therapy for her condition.  At this point, I have limited treatment options.  We discussed complementary and alternative pain management techniques such as yoga, acupuncture, mindfulness.  Her MRI from 2021 does show L5-S1 disc degeneration without any significant foraminal or spinal stenosis.  I encouraged her to continue with physical therapy exercises that she has learned in the past to maintain laxity and flexibility of her spine.  Follow-up plan:   Return if symptoms worsen or fail to improve.     Right L5/S1 ESI #1 05/09/2021, Left L5 TF ESI      Recent Visits Date Type Provider Dept  10/03/21 Procedure visit Gillis Santa, MD [facility]  Showing recent visits within past 90 days and meeting all other requirements Today's Visits Date Type Provider Dept  11/01/21 Office Visit Gillis Santa, MD [facility]  Showing today's visits and meeting all other requirements Future Appointments No visits  were found meeting these conditions. Showing future appointments within next 90 days and meeting all other  requirements  I discussed the assessment and treatment plan with the patient. The patient was provided an opportunity to ask questions and all were answered. The patient agreed with the plan and demonstrated an understanding of the instructions.  Patient advised to call back or seek an in-person evaluation if the symptoms or condition worsens.  Duration of encounter: 15 minutes.  Note by: Gillis Santa, MD Date: 11/01/2021; Time: 1:47 PM

## 2021-11-21 ENCOUNTER — Ambulatory Visit (INDEPENDENT_AMBULATORY_CARE_PROVIDER_SITE_OTHER): Payer: 59

## 2021-11-21 DIAGNOSIS — G4733 Obstructive sleep apnea (adult) (pediatric): Secondary | ICD-10-CM | POA: Diagnosis not present

## 2021-11-21 NOTE — Progress Notes (Signed)
95 percentile pressure 8   95th percentile leak 21    apnea-hypopnea index  5.8 /hr   total days used  >4 hr 85 days  total days used <4 hr 5 days  Total compliance 94.4 percent  She is doing great dry mouth went over humidifier no other problems or questions at this time Pt was seen by Claiborne Billings  RRT/RCP  from [facility]

## 2021-11-25 DIAGNOSIS — G4733 Obstructive sleep apnea (adult) (pediatric): Secondary | ICD-10-CM | POA: Diagnosis not present

## 2021-11-27 NOTE — Progress Notes (Deleted)
Referring Physician:  No referring provider defined for this encounter.  Primary Physician:  Sharyne Peach, MD  History of Present Illness:  History of chronic pain. Has seen Dr. Primus Bravo. Was referred to Dr. Mechele Dawley at [facility]. History of ACDF C5-C6 ***.  Last seen by Rachael Jordan on 08/11/21. Previous lumbar MRI DDD L5-S1 with slight narrowing of left neural foramen with no impingement.   No cervical imaging?***  11/27/2021 [name] is here today with a chief complaint of ***  Duration: *** Location: *** Quality: *** Severity: ***  Precipitating: aggravated by *** Modifying factors: made better by *** Weakness: none Timing: *** Bowel/Bladder Dysfunction: none  Conservative measures:  Physical therapy: ***  Multimodal medical therapy including regular antiinflammatories: tizanidine, oxycodone, gabapentin, flexeril, robaxin, baclofen, duloxetine, valium, ketorolac injection, xtampza, celebrex Injections: Procedures: 05/05/2020: Bilateral S1 transforaminal ESI 12/16/2019: Bilateral S1 transforaminal ESI (moderate to good relief) 11/17/2019: Spinal cord stimulator trial (no relief, Dr. Holley Raring) 07/29/2019: Bilateral S1 transforaminal ESI (mild to moderate relief) 07/01/2019: Bilateral S1 transforaminal ESI (30% relief)  Past Surgery: ACDF 12 years ago.   Rachael Jordan has ***no symptoms of cervical myelopathy.  The symptoms are causing a significant impact on the patient's life.   Review of Systems:  A 10 point review of systems is negative, except for the pertinent positives and negatives detailed in the HPI.  Past Medical History: Past Medical History:  Diagnosis Date   Acid reflux    Anxiety    Arthritis    Asthma    Bronchitis 09/2015   CVA (cerebral vascular accident) ([place])    Gross hematuria    H. pylori infection    Heart murmur    Kidney failure    Labile essential hypertension 01/06/2018   Lung abnormality    "damaged lung due to  pneumonia"   Neck pain    Nephrolithiasis    Pneumonia    Pneumothorax    Scoliosis    Seasonal allergies    Urinary frequency    Vaginal atrophy     Past Surgical History: Past Surgical History:  Procedure Laterality Date   ABDOMINAL HYSTERECTOMY     vaginal   APPENDECTOMY     BREAST BIOPSY     COLONOSCOPY N/A 01/02/2020   Procedure: COLONOSCOPY;  Surgeon: Toledo, Benay Pike, MD;  Location: [facility] ENDOSCOPY;  Service: Gastroenterology;  Laterality: N/A;   COLONOSCOPY WITH PROPOFOL N/A 12/29/2018   Procedure: COLONOSCOPY WITH PROPOFOL;  Surgeon: Lucilla Lame, MD;  Location: [facility] ENDOSCOPY;  Service: Endoscopy;  Laterality: N/A;   ESOPHAGOGASTRODUODENOSCOPY N/A 01/02/2020   Procedure: ESOPHAGOGASTRODUODENOSCOPY (EGD);  Surgeon: Toledo, Benay Pike, MD;  Location: [facility] ENDOSCOPY;  Service: Gastroenterology;  Laterality: N/A;   FOOT SURGERY Left    10/2015   NECK SURGERY      Allergies: Allergies as of 11/29/2021 - Review Complete 11/01/2021  Allergen Reaction Noted   Morphine and related Itching 09/11/2020   Oxymorphone Itching 09/14/2020   Grapefruit extract Rash 08/13/2013   Oxycodone Itching 10/26/2019   Propoxyphene Nausea And Vomiting and Nausea Only 12/28/2014    Medications: Outpatient Encounter Medications as of 11/29/2021  Medication Sig   albuterol (PROVENTIL) (2.5 MG/3ML) 0.083% nebulizer solution Take 2.5 mg by nebulization every 6 (six) hours as needed for wheezing or shortness of breath.   albuterol (VENTOLIN HFA) 108 (90 Base) MCG/ACT inhaler Inhale 2 puffs into the lungs every 6 (six) hours as needed for wheezing or shortness of breath.   amLODipine (NORVASC) 5 MG tablet  Take 5 mg by mouth daily.   ammonium lactate (AMLACTIN) 12 % lotion Apply 1 application topically as needed for dry skin.   aspirin EC 81 MG tablet Take 81 mg by mouth daily.   atorvastatin (LIPITOR) 20 MG tablet Take 20 mg by mouth daily.   azelastine (ASTELIN) 0.1 % nasal spray Place 1-2  sprays into both nostrils 2 (two) times daily.    Biotin 10000 MCG TABS Take 10 mg by mouth daily.    budesonide-formoterol (SYMBICORT) 80-4.5 MCG/ACT inhaler Inhale 2 puffs into the lungs 2 (two) times daily.   cetirizine (ZYRTEC) 10 MG tablet Take 10 mg by mouth at bedtime.    Cholecalciferol 25 MCG (1000 UT) tablet Take 1,000 Units by mouth daily.    Cyanocobalamin (B-12) 1000 MCG TABS Take by mouth daily.   diclofenac Sodium (VOLTAREN) 1 % GEL Apply topically.   gabapentin (NEURONTIN) 800 MG tablet Take 800 mg by mouth daily as needed. Causes hair loss   HYDROcodone-acetaminophen (NORCO) 10-325 MG tablet Take 1 tablet by mouth every 4 (four) hours as needed.   ipratropium (ATROVENT) 0.06 % nasal spray Place into the nose.   lidocaine (LIDODERM) 5 % Place 1 patch onto the skin every 12 (twelve) hours. Remove & Discard patch within 12 hours or as directed by MD (Patient not taking: Reported on 11/01/2021)   mirabegron ER (MYRBETRIQ) 50 MG TB24 tablet Take 1 tablet (50 mg total) by mouth daily.   montelukast (SINGULAIR) 10 MG tablet Take 10 mg by mouth in the morning.   Multiple Vitamins-Minerals (CENTRUM ADULTS PO) Take 1 tablet by mouth daily.    naloxone (NARCAN) nasal spray 4 mg/0.1 mL SMARTSIG:Both Nares   nortriptyline (PAMELOR) 10 MG capsule Take 10 mg by mouth 2 (two) times daily.   oxymorphone (OPANA) 10 MG tablet 10 mg every 8 (eight) hours as needed.   pantoprazole (PROTONIX) 40 MG tablet Take 40 mg by mouth daily.   pyridOXINE (VITAMIN B-6) 100 MG tablet Take 100 mg by mouth daily.   rizatriptan (MAXALT) 10 MG tablet Take 10 mg by mouth as needed for migraine. May repeat in 2 hours if needed   tiZANidine (ZANAFLEX) 4 MG tablet Take by mouth.   traZODone (DESYREL) 100 MG tablet Take 200 mg by mouth at bedtime.    valACYclovir (VALTREX) 1000 MG tablet Take 1,000 mg by mouth daily as needed.   vitamin C (ASCORBIC ACID) 500 MG tablet Take 500 mg by mouth daily.   Vitamin E 45 MG CAPS  Take 90 mg by mouth daily.   Facility-Administered Encounter Medications as of 11/29/2021  Medication   betamethasone acetate-betamethasone sodium phosphate (CELESTONE) injection 3 mg    Social History: Social History   Tobacco Use   Smoking status: Never   Smokeless tobacco: Never  Vaping Use   Vaping Use: Never used  Substance Use Topics   Alcohol use: No    Alcohol/week: 0.0 standard drinks of alcohol   Drug use: No    Family Medical History: Family History  Problem Relation Age of Onset   Arthritis Mother    Asthma Mother    Hyperlipidemia Mother    Hypertension Mother    Diabetes Sister    Cancer Maternal Aunt        esophagus   Breast cancer Neg Hx    Kidney cancer Neg Hx    Bladder Cancer Neg Hx     Physical Examination: There were no vitals filed for this visit.  General: Patient is well developed, well nourished, calm, collected, and in no apparent distress. Attention to examination is appropriate.  Respiratory: Patient is breathing without any difficulty.   NEUROLOGICAL:     Awake, alert, oriented to person, place, and time.  Speech is clear and fluent. Fund of knowledge is appropriate.   Cranial Nerves: Pupils equal round and reactive to light.  Facial tone is symmetric.  Facial sensation is symmetric.  ROM of spine:  *** ROM of cervical spine *** pain *** ROM of lumbar spine *** pain  No abnormal lesions on exposed skin.   Strength: Side Biceps Triceps Deltoid Interossei Grip Wrist Ext. Wrist Flex.  R 5 5 5 5 5 5 5   L 5 5 5 5 5 5 5    Side Iliopsoas Quads Hamstring PF DF EHL  R 5 5 5 5 5 5   L 5 5 5 5 5 5    Reflexes are ***2+ and symmetric at the biceps, triceps, brachioradialis, patella and achilles.   Hoffman's is absent.  Clonus is not present.   Bilateral upper and lower extremity sensation is intact to light touch.    No evidence of dysmetria noted.  Gait is normal.   ***No difficulty with tandem gait.    Medical Decision  Making  Imaging: ***  I have personally reviewed the images and agree with the above interpretation.  Assessment and Plan: [name] is a pleasant 66 y.o. female with ***  Above treatment options discussed with patient and following plan made:   - Order for physical therapy for *** spine ***. - Continue on current medications including ***. Reviewed proper dosing along with risks and benefits. Take and NSAIDs with food.      I spent a total of *** minutes in face-to-face and non-face-to-face activities related to this patient's care today.  Thank you for involving me in the care of this patient.   Geronimo Boot PA-C Dept. of Neurosurgery

## 2021-11-29 ENCOUNTER — Ambulatory Visit: Payer: 59 | Admitting: Orthopedic Surgery

## 2021-12-09 NOTE — Progress Notes (Unsigned)
Referring Physician:  Sharyne Peach, MD [address],  [place] [address]  Primary Physician:  Sharyne Peach, MD  History of Present Illness:  History of chronic pain. Has sees Dr. Primus Bravo for medical management and is seeing  Dr. Holley Raring for injections only.   History of ACDF C5-C6 about 12 years ago and she did well.   Now with 4 month history of intermittent left sided neck pain that radiates her shoulder. No specific arm pain. No numbness, tingling, or weakness in her arms. No right sided neck or arm pain.   No treatment for her neck. No PT, injections for her neck.   Also with 4-5 months of constant left mid back pain that is worse with bending and reaching out for things. Pain is in her left flank region. No radiation of pain to her chest.   She has chronic LBP that is more constant. She has intermittent radiation to her butt and groin. No numbness or tingling in her legs. No weakness in her legs.   Also with intermittent right knee pain with posterior swelling.   Last seen by Danielle on 08/12/19. Previous lumbar MRI DDD L5-S1 with slight narrowing of left neural foramen with no impingement.   Conservative measures:  Physical therapy: last for lumbar spine a few years ago made her worse. No PT for her neck.   Multimodal medical therapy including regular antiinflammatories: tizanidine, oxycodone, gabapentin, flexeril, robaxin, baclofen, duloxetine, valium, ketorolac injection, xtampza, celebrex Injections: Procedures: 05/05/2020: Bilateral S1 transforaminal ESI 12/16/2019: Bilateral S1 transforaminal ESI (moderate to good relief) 11/17/2019: Spinal cord stimulator trial (no relief, Dr. Holley Raring) 07/29/2019: Bilateral S1 transforaminal ESI (mild to moderate relief) 07/01/2019: Bilateral S1 transforaminal ESI (30% relief)  Past Surgery: ACDF 12 years ago.   Kody Reigel has no symptoms of cervical myelopathy.  Review of Systems:  A 10 point review of systems  is negative, except for the pertinent positives and negatives detailed in the HPI.  Past Medical History: Past Medical History:  Diagnosis Date   Acid reflux    Anxiety    Arthritis    Asthma    Bronchitis 09/2015   CVA (cerebral vascular accident) ([place])    Gross hematuria    H. pylori infection    Heart murmur    Kidney failure    Labile essential hypertension 01/06/2018   Lung abnormality    "damaged lung due to pneumonia"   Neck pain    Nephrolithiasis    Pneumonia    Pneumothorax    Scoliosis    Seasonal allergies    Urinary frequency    Vaginal atrophy     Past Surgical History: Past Surgical History:  Procedure Laterality Date   ABDOMINAL HYSTERECTOMY     vaginal   APPENDECTOMY     BREAST BIOPSY     COLONOSCOPY N/A 01/02/2020   Procedure: COLONOSCOPY;  Surgeon: Toledo, Benay Pike, MD;  Location: [facility] ENDOSCOPY;  Service: Gastroenterology;  Laterality: N/A;   COLONOSCOPY WITH PROPOFOL N/A 12/29/2018   Procedure: COLONOSCOPY WITH PROPOFOL;  Surgeon: Lucilla Lame, MD;  Location: [facility] ENDOSCOPY;  Service: Endoscopy;  Laterality: N/A;   ESOPHAGOGASTRODUODENOSCOPY N/A 01/02/2020   Procedure: ESOPHAGOGASTRODUODENOSCOPY (EGD);  Surgeon: Toledo, Benay Pike, MD;  Location: [facility] ENDOSCOPY;  Service: Gastroenterology;  Laterality: N/A;   FOOT SURGERY Left    10/2015   NECK SURGERY      Allergies: Allergies as of 12/13/2021 - Review Complete 11/01/2021  Allergen Reaction Noted   Morphine  and related Itching 09/11/2020   Oxymorphone Itching 09/14/2020   Grapefruit extract Rash 08/13/2013   Oxycodone Itching 10/26/2019   Propoxyphene Nausea And Vomiting and Nausea Only 12/28/2014    Medications: Outpatient Encounter Medications as of 12/13/2021  Medication Sig   albuterol (PROVENTIL) (2.5 MG/3ML) 0.083% nebulizer solution Take 2.5 mg by nebulization every 6 (six) hours as needed for wheezing or shortness of breath.   albuterol (VENTOLIN HFA) 108 (90 Base) MCG/ACT  inhaler Inhale 2 puffs into the lungs every 6 (six) hours as needed for wheezing or shortness of breath.   amLODipine (NORVASC) 5 MG tablet Take 5 mg by mouth daily.   ammonium lactate (AMLACTIN) 12 % lotion Apply 1 application topically as needed for dry skin.   aspirin EC 81 MG tablet Take 81 mg by mouth daily.   atorvastatin (LIPITOR) 20 MG tablet Take 20 mg by mouth daily.   azelastine (ASTELIN) 0.1 % nasal spray Place 1-2 sprays into both nostrils 2 (two) times daily.    Biotin 10000 MCG TABS Take 10 mg by mouth daily.    budesonide-formoterol (SYMBICORT) 80-4.5 MCG/ACT inhaler Inhale 2 puffs into the lungs 2 (two) times daily.   cetirizine (ZYRTEC) 10 MG tablet Take 10 mg by mouth at bedtime.    Cholecalciferol 25 MCG (1000 UT) tablet Take 1,000 Units by mouth daily.    Cyanocobalamin (B-12) 1000 MCG TABS Take by mouth daily.   diclofenac Sodium (VOLTAREN) 1 % GEL Apply topically.   gabapentin (NEURONTIN) 800 MG tablet Take 800 mg by mouth daily as needed. Causes hair loss   HYDROcodone-acetaminophen (NORCO) 10-325 MG tablet Take 1 tablet by mouth every 4 (four) hours as needed.   ipratropium (ATROVENT) 0.06 % nasal spray Place into the nose.   lidocaine (LIDODERM) 5 % Place 1 patch onto the skin every 12 (twelve) hours. Remove & Discard patch within 12 hours or as directed by MD (Patient not taking: Reported on 11/01/2021)   mirabegron ER (MYRBETRIQ) 50 MG TB24 tablet Take 1 tablet (50 mg total) by mouth daily.   montelukast (SINGULAIR) 10 MG tablet Take 10 mg by mouth in the morning.   Multiple Vitamins-Minerals (CENTRUM ADULTS PO) Take 1 tablet by mouth daily.    naloxone (NARCAN) nasal spray 4 mg/0.1 mL SMARTSIG:Both Nares   nortriptyline (PAMELOR) 10 MG capsule Take 10 mg by mouth 2 (two) times daily.   oxymorphone (OPANA) 10 MG tablet 10 mg every 8 (eight) hours as needed.   pantoprazole (PROTONIX) 40 MG tablet Take 40 mg by mouth daily.   pyridOXINE (VITAMIN B-6) 100 MG tablet Take  100 mg by mouth daily.   rizatriptan (MAXALT) 10 MG tablet Take 10 mg by mouth as needed for migraine. May repeat in 2 hours if needed   tiZANidine (ZANAFLEX) 4 MG tablet Take by mouth.   traZODone (DESYREL) 100 MG tablet Take 200 mg by mouth at bedtime.    valACYclovir (VALTREX) 1000 MG tablet Take 1,000 mg by mouth daily as needed.   vitamin C (ASCORBIC ACID) 500 MG tablet Take 500 mg by mouth daily.   Vitamin E 45 MG CAPS Take 90 mg by mouth daily.   Facility-Administered Encounter Medications as of 12/13/2021  Medication   betamethasone acetate-betamethasone sodium phosphate (CELESTONE) injection 3 mg    Social History: Social History   Tobacco Use   Smoking status: Never   Smokeless tobacco: Never  Vaping Use   Vaping Use: Never used  Substance Use Topics  Alcohol use: No    Alcohol/week: 0.0 standard drinks of alcohol   Drug use: No    Family Medical History: Family History  Problem Relation Age of Onset   Arthritis Mother    Asthma Mother    Hyperlipidemia Mother    Hypertension Mother    Diabetes Sister    Cancer Maternal Aunt        esophagus   Breast cancer Neg Hx    Kidney cancer Neg Hx    Bladder Cancer Neg Hx     Physical Examination: There were no vitals filed for this visit.  General: Patient is well developed, well nourished, calm, collected, and in no apparent distress. Attention to examination is appropriate.  Respiratory: Patient is breathing without any difficulty.   NEUROLOGICAL:     Awake, alert, oriented to person, place, and time.  Speech is clear and fluent. Fund of knowledge is appropriate.   Cranial Nerves: Pupils equal round and reactive to light.  Facial tone is symmetric.  Facial sensation is symmetric.  Well healed cervical incision.   No posterior cervical tenderness.   She has tenderness in trapezial region on left. She has limited ROM of left shoulder that is painful. Pain with stress of rotator cuff.   She has tenderness  in her left mid back/flank. No rashes noted. No tenderness over thoracic spine.   She has mild lower lumbar tenderness.   She has no joint line tenderness of left knee. No crepitus with ROM. Mild posterior tenderness.   Strength: Side Biceps Triceps Deltoid Interossei Grip Wrist Ext. Wrist Flex.  R 5 5 5 5 5 5 5   L 5 5 5 5 5 5 5    Side Iliopsoas Quads Hamstring PF DF EHL  R 5 5 5 5 5 5   L 5 5 5 5 5 5    Reflexes are 2+ and symmetric at the biceps, triceps, brachioradialis, patella and achilles.   Hoffman's is absent.  Clonus is not present.   Bilateral upper and lower extremity sensation is intact to light touch.     Gait is normal.     Medical Decision Making  Imaging: None available.   I have personally reviewed the images and agree with the above interpretation.  Assessment and Plan: Ms. Hamid is a pleasant 66 y.o. female with history of ACDF C5-C6 about 12 years ago and she did well.   Now with 4 month history of intermittent left sided neck pain that radiates her shoulder. No specific arm pain.   Left shoulder pain appears left shoulder mediated. She has limited/painful ROM of the shoulder.   Also with 4-5 months of constant left mid back pain that is worse with bending and reaching out for things. Pain is in her left flank region. No radiation of pain to her chest.   Thoracic pain appears more muscular in nature as she has tenderness in this area. No rashes/lesions noted.   She has chronic LBP that is more constant. She has intermittent radiation to her butt and groin. She is at her baseline with this.   Also with intermittent right knee pain with swelling.   Treatment options discussed with patient and following plan made:   - Xrays of cervical and thoracic spine on her way out. Will call with results.  - Referral to orthopaedics for evaluation of left shoulder and left knee pain.  - Recommend PT for thoracic spine. She declines.  - Will discuss thoracic pain  with Dr. Holley Raring  to see if he recommends any injections.   I spent a total of 30 minutes in face-to-face and non-face-to-face activities related to this patient's care today.  Thank you for involving me in the care of this patient.   Geronimo Boot PA-C Dept. of Neurosurgery

## 2021-12-13 ENCOUNTER — Ambulatory Visit
Admission: RE | Admit: 2021-12-13 | Discharge: 2021-12-13 | Disposition: A | Discharge status: Home / Self Care | Payer: 59 | Attending: Orthopedic Surgery | Admitting: Orthopedic Surgery

## 2021-12-13 ENCOUNTER — Ambulatory Visit
Admission: RE | Admit: 2021-12-13 | Discharge: 2021-12-13 | Disposition: A | Discharge status: Home / Self Care | Payer: 59 | Source: Ambulatory Visit | Attending: Orthopedic Surgery | Admitting: Orthopedic Surgery

## 2021-12-13 ENCOUNTER — Ambulatory Visit: Payer: 59 | Admitting: Orthopedic Surgery

## 2021-12-13 ENCOUNTER — Encounter: Payer: Self-pay | Admitting: Orthopedic Surgery

## 2021-12-13 VITALS — BP 136/80 | Ht 65.0 in | Wt 146.0 lb

## 2021-12-13 DIAGNOSIS — M545 Low back pain, unspecified: Secondary | ICD-10-CM

## 2021-12-13 DIAGNOSIS — M25562 Pain in left knee: Secondary | ICD-10-CM

## 2021-12-13 DIAGNOSIS — Z981 Arthrodesis status: Secondary | ICD-10-CM | POA: Insufficient documentation

## 2021-12-13 DIAGNOSIS — M25561 Pain in right knee: Secondary | ICD-10-CM

## 2021-12-13 DIAGNOSIS — M546 Pain in thoracic spine: Secondary | ICD-10-CM

## 2021-12-13 DIAGNOSIS — M542 Cervicalgia: Secondary | ICD-10-CM | POA: Insufficient documentation

## 2021-12-13 DIAGNOSIS — M47814 Spondylosis without myelopathy or radiculopathy, thoracic region: Secondary | ICD-10-CM | POA: Diagnosis not present

## 2021-12-13 DIAGNOSIS — M47812 Spondylosis without myelopathy or radiculopathy, cervical region: Secondary | ICD-10-CM | POA: Diagnosis not present

## 2021-12-13 DIAGNOSIS — M25512 Pain in left shoulder: Secondary | ICD-10-CM | POA: Diagnosis not present

## 2021-12-13 DIAGNOSIS — G8929 Other chronic pain: Secondary | ICD-10-CM | POA: Diagnosis not present

## 2021-12-13 DIAGNOSIS — M7918 Myalgia, other site: Secondary | ICD-10-CM

## 2021-12-13 NOTE — Patient Instructions (Addendum)
It was so nice to see you today, I am sorry that you are hurting so much.   I want to check xrays of your neck and mid back.   I think your left shoulder pain is coming from your shoulder. I put in a referral for you to see orthopaedics at the [facility] for the left shoulder and left knee.   I think your mid back pain may be more muscular in nature. I would discuss any possible injections with Dr. Holley Raring.   I do recommend PT for the mid back, if you change your mind then let me know and I can put in orders.   I will call you with your xray results in next few days.   If you have not heard back about any of the tests/procedures in the next week, please call the office so we can help you get these things scheduled.   Geronimo Boot PA-C [phone]

## 2021-12-18 ENCOUNTER — Encounter: Payer: Self-pay | Admitting: Orthopedic Surgery

## 2021-12-18 NOTE — Progress Notes (Signed)
Cervical xrays 12/13/21:  FINDINGS: No acute fracture or traumatic malalignment. ACDF C5-C6. Advanced multilevel spondylosis and disc space height loss, progressed from 02/18/2019. This is greatest at C3-C4 and C4-C5. Chronic loss of normal cervical lordosis. No evidence of instability on flexion or extension views. Normal prevertebral soft tissues. The dens is well positioned between the lateral masses of C1.   IMPRESSION: ACDF C5-C6.   Advanced spondylosis and disc space height loss greatest at C3-C4 and C4-C5.     Electronically Signed   By: Placido Sou M.D.   On: 12/15/2021 00:20    Thoracic xrays 12/13/21:  FINDINGS: No acute fracture or traumatic malalignment. Slight thoracic curve. Chronic wedging of T12. Mild multilevel spondylosis disc space height loss. ACDF C5-C6.   IMPRESSION: No acute fracture or traumatic malalignment. Mild multilevel spondylosis.     Electronically Signed   By: Placido Sou M.D.   On: 12/15/2021 00:22  I have personally reviewed the images and agree with the above interpretation.  She has previous ACDF C5-C6 with multilevel DDD/spondylosis. Stable wedging of T12 (seen on CT on 12/30/19).   No change in plan we discussed at her last visit.   Tried to call patient to review above results and left voicemail for her to call me back.

## 2021-12-19 ENCOUNTER — Telehealth: Payer: Self-pay | Admitting: Orthopedic Surgery

## 2021-12-19 NOTE — Telephone Encounter (Signed)
-----   Message from Peggyann Shoals sent at 12/19/2021  7:56 AM EDT ----- Ms. Guderian left a v/m at 4:43pm yesterday that she was waiting for a phone call regarding her xrays results. Can you call her whenever you have time again today.[phone]

## 2021-12-19 NOTE — Telephone Encounter (Signed)
Spoke with patient about xray results and answered all questions.

## 2021-12-20 ENCOUNTER — Ambulatory Visit (INDEPENDENT_AMBULATORY_CARE_PROVIDER_SITE_OTHER): Payer: 59

## 2021-12-20 ENCOUNTER — Ambulatory Visit: Payer: 59 | Admitting: Podiatry

## 2021-12-20 DIAGNOSIS — L989 Disorder of the skin and subcutaneous tissue, unspecified: Secondary | ICD-10-CM | POA: Diagnosis not present

## 2021-12-20 DIAGNOSIS — M7741 Metatarsalgia, right foot: Secondary | ICD-10-CM

## 2021-12-20 DIAGNOSIS — D3610 Benign neoplasm of peripheral nerves and autonomic nervous system, unspecified: Secondary | ICD-10-CM

## 2021-12-20 DIAGNOSIS — M216X2 Other acquired deformities of left foot: Secondary | ICD-10-CM | POA: Diagnosis not present

## 2021-12-20 DIAGNOSIS — M7742 Metatarsalgia, left foot: Secondary | ICD-10-CM

## 2021-12-20 NOTE — Progress Notes (Addendum)
Subjective: Chief Complaint  Patient presents with   Foot Pain    Left foot neuroma pain, rate of pain 7 out of 10, patient is doing the same as last visit, patient would like to talk about surgery today   66 year old female presents the office today for above concerns.  She is attempted numerous conservative treatments this time she wants to discuss surgical intervention given ongoing pain to the left foot.  Objective: AAO x3, NAD DP/PT pulses palpable bilaterally, CRT less than 3 seconds Nails are mildly hypertrophic, dystrophic with yellow discoloration.  No pain in the nails. There is tenderness on the left first and second interspace.  Describing nerve sensation.  Ongoing callus formation which is tender on the plantar aspect of the foot as well.  There is no open lesion.  Prominent metatarsal head.  MMT 5/5. No pain with calf compression, swelling, warmth, erythema  Assessment: Left chronic foot pain, possible neuroma, prominent metatarsal head resulting in hyperkeratotic lesion; onychomycosis  Plan: -All treatment options discussed with the patient including all alternatives, risks, complications.  -Discussed both conservative as well as surgical treatment options.  She is attempted numerous conservative treatments without significant resolution she was proceed with surgical intervention.  Left neuroma excision vs decompression 1st and 2nd interspace, osteotomy third -The incision placement as well as the postoperative course was discussed with the patient. I discussed risks of the surgery which include, but not limited to, infection, bleeding, pain, swelling, need for further surgery, delayed or nonhealing, painful or ugly scar, numbness or sensation changes, over/under correction, recurrence, transfer lesions, further deformity, hardware failure, DVT/PE, loss of toe/foot. Patient understands these risks and wishes to proceed with surgery. The surgical consent was reviewed with the  patient all 3 pages were signed. No promises or guarantees were given to the outcome of the procedure. All questions were answered to the best of my ability. Before the surgery the patient was encouraged to call the office if there is any further questions. The surgery will be performed at the [facility] on an outpatient basis. -Penlac for nail fungus -Patient encouraged to call the office with any questions, concerns, change in symptoms.   Trula Slade DPM

## 2021-12-20 NOTE — Patient Instructions (Signed)
Pre-Operative Instructions  Congratulations, you have decided to take an important step to improving your quality of life.  You can be assured that the doctors of [facility] will be with you every step of the way.  Plan to be at the surgery center/hospital at least 1 (one) hour prior to your scheduled time unless otherwise directed by the surgical center/hospital staff.  You must have a responsible adult accompany you, remain during the surgery and drive you home.  Make sure you have directions to the surgical center/hospital and know how to get there on time. For hospital based surgery you will need to obtain a history and physical form from your family physician within 1 month prior to the date of surgery- we will give you a form for you primary physician.  We make every effort to accommodate the date you request for surgery.  There are however, times where surgery dates or times have to be moved.  We will contact you as soon as possible if a change in schedule is required.   No Aspirin/Ibuprofen for one week before surgery.  If you are on aspirin, any non-steroidal anti-inflammatory medications (Mobic, Aleve, Ibuprofen) you should stop taking it 7 days prior to your surgery.  You make take Tylenol  For pain prior to surgery.  Medications- If you are taking daily heart and blood pressure medications, seizure, reflux, allergy, asthma, anxiety, pain or diabetes medications, make sure the surgery center/hospital is aware before the day of surgery so they may notify you which medications to take or avoid the day of surgery. No food or drink after midnight the night before surgery unless directed otherwise by surgical center/hospital staff. No alcoholic beverages 24 hours prior to surgery.  No smoking 24 hours prior to or 24 hours after surgery. Wear loose pants or shorts- loose enough to fit over bandages, boots, and casts. No slip on shoes, sneakers are best. Bring your boot with you to the  surgery center/hospital.  Also bring crutches or a walker if your physician has prescribed it for you.  If you do not have this equipment, it will be provided for you after surgery. If you have not been contracted by the surgery center/hospital by the day before your surgery, call to confirm the date and time of your surgery. Leave-time from work may vary depending on the type of surgery you have.  Appropriate arrangements should be made prior to surgery with your employer. Prescriptions will be provided immediately following surgery by your doctor.  Have these filled as soon as possible after surgery and take the medication as directed. Remove nail polish on the operative foot. Wash the night before surgery.  The night before surgery wash the foot and leg well with the antibacterial soap provided and water paying special attention to beneath the toenails and in between the toes.  Rinse thoroughly with water and dry well with a towel.  Perform this wash unless told not to do so by your physician.  Enclosed: 1 Ice pack (please put in freezer the night before surgery)   1 Hibiclens skin cleaner   Pre-op Instructions  If you have any questions regarding the instructions, do not hesitate to call our office at any point during this process.   : [address] [place], [place] [address] [place]: [address], [place], [place] [address] [phone]  Dr. Celesta Gentile, DPM

## 2021-12-21 ENCOUNTER — Telehealth: Payer: Self-pay | Admitting: Podiatry

## 2021-12-21 NOTE — Telephone Encounter (Signed)
Pt called in inquiring about an antifungal Rx that she stated her pharmacy at [facility] has not received. Please advise.

## 2021-12-22 MED ORDER — CICLOPIROX 8 % EX SOLN
Freq: Every day | CUTANEOUS | 2 refills | Status: DC
Start: 2021-12-22 — End: 2022-02-21

## 2021-12-22 NOTE — Addendum Note (Signed)
Addended by: Celesta Gentile R on: 12/22/2021 05:22 PM   Modules accepted: Orders

## 2021-12-25 ENCOUNTER — Telehealth: Payer: Self-pay | Admitting: Urology

## 2021-12-25 DIAGNOSIS — J4530 Mild persistent asthma, uncomplicated: Secondary | ICD-10-CM | POA: Diagnosis not present

## 2021-12-25 DIAGNOSIS — G4733 Obstructive sleep apnea (adult) (pediatric): Secondary | ICD-10-CM | POA: Diagnosis not present

## 2021-12-25 NOTE — Telephone Encounter (Signed)
DOS - 01/02/22  METATARSAL OSTEOTOMY 3RD LEFT --- 43606 EXC. BENIGN LESION LEFT --- 11421 NEURECTOMY LEFT --- 77034 NERVE DECOMPRESSION LEFT --- 03524  [employer] EFFECTIVE DATE - 03/18/21  SPOKE WITH ANNA WITH [employer] AND SHE STATED THAT FOR CPT CODES 81859, 11421, 09311 AND 21624 NO PRIOR AUTH IS REQUIRED.  REF # [id number]

## 2022-01-02 ENCOUNTER — Encounter: Payer: Self-pay | Admitting: Podiatry

## 2022-01-02 ENCOUNTER — Telehealth: Payer: Self-pay

## 2022-01-02 ENCOUNTER — Other Ambulatory Visit: Payer: Self-pay | Admitting: Podiatry

## 2022-01-02 DIAGNOSIS — M205X2 Other deformities of toe(s) (acquired), left foot: Secondary | ICD-10-CM | POA: Diagnosis not present

## 2022-01-02 DIAGNOSIS — B078 Other viral warts: Secondary | ICD-10-CM | POA: Diagnosis not present

## 2022-01-02 DIAGNOSIS — R69 Illness, unspecified: Secondary | ICD-10-CM | POA: Diagnosis not present

## 2022-01-02 DIAGNOSIS — M21542 Acquired clubfoot, left foot: Secondary | ICD-10-CM | POA: Diagnosis not present

## 2022-01-02 DIAGNOSIS — G5762 Lesion of plantar nerve, left lower limb: Secondary | ICD-10-CM | POA: Diagnosis not present

## 2022-01-02 DIAGNOSIS — G8918 Other acute postprocedural pain: Secondary | ICD-10-CM | POA: Diagnosis not present

## 2022-01-02 DIAGNOSIS — D492 Neoplasm of unspecified behavior of bone, soft tissue, and skin: Secondary | ICD-10-CM | POA: Diagnosis not present

## 2022-01-02 MED ORDER — OXYCODONE-ACETAMINOPHEN 5-325 MG PO TABS: 1.0000 | ORAL_TABLET | ORAL | 0 refills | Status: DC | PRN

## 2022-01-02 MED ORDER — OXYCODONE-ACETAMINOPHEN 7.5-325 MG PO TABS: 1.0000 | ORAL_TABLET | ORAL | 0 refills | Status: DC | PRN

## 2022-01-02 MED ORDER — NALOXONE HCL 4 MG/0.1ML NA LIQD: NASAL | 1 refills | Status: AC

## 2022-01-02 MED ORDER — PROMETHAZINE HCL 25 MG PO TABS: 25.0000 mg | ORAL_TABLET | Freq: Three times a day (TID) | ORAL | 0 refills | Status: AC | PRN

## 2022-01-02 MED ORDER — CEPHALEXIN 500 MG PO CAPS: 500.0000 mg | ORAL_CAPSULE | Freq: Three times a day (TID) | ORAL | 0 refills | Status: DC

## 2022-01-02 NOTE — Progress Notes (Signed)
Postop medications sent

## 2022-01-03 NOTE — Telephone Encounter (Signed)
No further evaluation is needed.

## 2022-01-07 ENCOUNTER — Ambulatory Visit (INDEPENDENT_AMBULATORY_CARE_PROVIDER_SITE_OTHER): Payer: 59

## 2022-01-07 ENCOUNTER — Ambulatory Visit: Payer: 59 | Admitting: Podiatry

## 2022-01-07 ENCOUNTER — Encounter: Payer: 59 | Admitting: Podiatry

## 2022-01-07 ENCOUNTER — Ambulatory Visit (INDEPENDENT_AMBULATORY_CARE_PROVIDER_SITE_OTHER): Payer: 59 | Admitting: Podiatry

## 2022-01-07 DIAGNOSIS — Z9889 Other specified postprocedural states: Secondary | ICD-10-CM

## 2022-01-07 DIAGNOSIS — D3610 Benign neoplasm of peripheral nerves and autonomic nervous system, unspecified: Secondary | ICD-10-CM

## 2022-01-07 DIAGNOSIS — M7741 Metatarsalgia, right foot: Secondary | ICD-10-CM

## 2022-01-07 DIAGNOSIS — M7742 Metatarsalgia, left foot: Secondary | ICD-10-CM

## 2022-01-09 NOTE — Progress Notes (Signed)
Subjective: Rachael Jordan is a 66 y.o. is seen today in office s/p left foot excision soft tissue mass, decompression neuroma specific interspace, third metatarsal osteotomy preformed on 04/04/2021.  States that she is doing well.  No significant pain at this time.  Denies any systemic complaints such as fevers, chills, nausea, vomiting. No calf pain, chest pain, shortness of breath.   Objective: General: No acute distress, AAOx3  DP/PT pulses palpable 2/4, CRT < 3 sec to all digits.  Protective sensation intact. Motor function intact.  LEFT foot: Incision is well coapted without any evidence of dehiscence with sutures intact. There is no surrounding erythema, ascending cellulitis, fluctuance, crepitus, malodor, drainage/purulence. There is no mild edema around the surgical site. There is no significant pain along the surgical site.  No other areas of tenderness to bilateral lower extremities.  No other open lesions or pre-ulcerative lesions.  No pain with calf compression, swelling, warmth, erythema.   Assessment and Plan:  Status post left foot surgery  -Treatment options discussed including all alternatives, risks, and complications X-rays were obtained and reviewed.  3 views of the foot were obtained.  Status post ostium of the metatarsal.  The AP view it does appear that the osteotomy has displaced some but she is also rotated on the x-ray. -Antibiotic ointment and bandage applied.  Keep the dressing clean, dry, intact -Ice/elevation -Pain medication as needed. -Monitor for any clinical signs or symptoms of infection and DVT/PE and directed to call the office immediately should any occur or go to the ER. -Follow-up for possible suture removal or sooner if any problems arise. In the meantime, encouraged to call the office with any questions, concerns, change in symptoms.   *Repeat x-ray next appointment.  Try to get good AP view.  Celesta Gentile, DPM

## 2022-01-16 DIAGNOSIS — J440 Chronic obstructive pulmonary disease with acute lower respiratory infection: Secondary | ICD-10-CM | POA: Diagnosis not present

## 2022-01-16 DIAGNOSIS — G894 Chronic pain syndrome: Secondary | ICD-10-CM | POA: Diagnosis not present

## 2022-01-17 ENCOUNTER — Ambulatory Visit (INDEPENDENT_AMBULATORY_CARE_PROVIDER_SITE_OTHER): Payer: 59

## 2022-01-17 ENCOUNTER — Ambulatory Visit (INDEPENDENT_AMBULATORY_CARE_PROVIDER_SITE_OTHER): Payer: 59 | Admitting: Podiatry

## 2022-01-17 DIAGNOSIS — M21962 Unspecified acquired deformity of left lower leg: Secondary | ICD-10-CM

## 2022-01-17 DIAGNOSIS — M216X2 Other acquired deformities of left foot: Secondary | ICD-10-CM

## 2022-01-17 DIAGNOSIS — L989 Disorder of the skin and subcutaneous tissue, unspecified: Secondary | ICD-10-CM

## 2022-01-17 DIAGNOSIS — Z9889 Other specified postprocedural states: Secondary | ICD-10-CM

## 2022-01-17 NOTE — Progress Notes (Unsigned)
g

## 2022-01-19 NOTE — Progress Notes (Signed)
Subjective: Chief Complaint  Patient presents with   Routine Post Op    POV #2 Neuroma, TX: cam boot     Rachael Jordan is a 66 y.o. is seen today in office s/p left foot excision soft tissue mass, decompression neuroma 1st and 2nd interspace, third metatarsal osteotomy preformed on 04/04/2021.  States that she is doing well. Presents today for possible suture removal.   Objective: General: No acute distress, AAOx3  DP/PT pulses palpable 2/4, CRT < 3 sec to all digits.  Protective sensation intact. Motor function intact.  LEFT foot: Incision is well coapted without any evidence of dehiscence with sutures intact. There is no surrounding erythema, ascending cellulitis, fluctuance, crepitus, malodor, drainage/purulence. There is no mild and improved edema around the surgical site. There is no significant pain along the surgical site.  No other areas of tenderness to bilateral lower extremities.  No other open lesions or pre-ulcerative lesions.  No pain with calf compression, swelling, warmth, erythema.   Assessment and Plan:  Status post left foot surgery  -Treatment options discussed including all alternatives, risks, and complications X-rays were obtained and reviewed.  3 views of the foot were obtained.  Status post ostium of the metatarsal.  The AP view it does appear that the osteotomy has displaced some but she is also rotated on the x-ray. This is unchanged. I discussed these findings with the patient. I also reviewed this with other providers in the office. Clnically the toes are in a rectus position. There is decreased pressure off of the area.  -Half of the sutures removed. Other remained intact.  -Antibiotic ointment and bandage applied.  Keep the dressing clean, dry, intact -Ice/elevation -Remain in CAM boot.  -Pain medication as needed. -Monitor for any clinical signs or symptoms of infection and DVT/PE and directed to call the office immediately should any occur or go to the  ER. -Follow-up for suture removal or sooner if any problems arise. In the meantime, encouraged to call the office with any questions, concerns, change in symptoms.   Celesta Gentile, DPM

## 2022-01-24 ENCOUNTER — Ambulatory Visit (INDEPENDENT_AMBULATORY_CARE_PROVIDER_SITE_OTHER): Payer: 59 | Admitting: Podiatry

## 2022-01-24 DIAGNOSIS — D3610 Benign neoplasm of peripheral nerves and autonomic nervous system, unspecified: Secondary | ICD-10-CM

## 2022-01-24 DIAGNOSIS — M216X2 Other acquired deformities of left foot: Secondary | ICD-10-CM

## 2022-01-24 DIAGNOSIS — M21962 Unspecified acquired deformity of left lower leg: Secondary | ICD-10-CM

## 2022-01-25 DIAGNOSIS — J4530 Mild persistent asthma, uncomplicated: Secondary | ICD-10-CM | POA: Diagnosis not present

## 2022-01-25 DIAGNOSIS — G4733 Obstructive sleep apnea (adult) (pediatric): Secondary | ICD-10-CM | POA: Diagnosis not present

## 2022-01-25 NOTE — Progress Notes (Signed)
Subjective: Chief Complaint  Patient presents with   Routine Post Op    Rm 20 Suture removal. Pt complains of a lot of pain and the ball of foot and lateral side of her foot. No other complaints expressed.      Rachael Jordan is a 66 y.o. is seen today in office s/p left foot excision soft tissue mass, decompression neuroma 1st and 2nd interspace, third metatarsal osteotomy preformed on 04/04/2021.  She admits that she has been on her feet more recently which is causing discomfort.  No fevers or chills that she reports.    Objective: General: No acute distress, AAOx3  DP/PT pulses palpable 2/4, CRT < 3 sec to all digits.  Protective sensation intact. Motor function intact.  LEFT foot: Incision is well coapted without any evidence of dehiscence with half of the sutures intact.  There is mild edema but does not appear to be significantly worsened compared to last appointment.  Is no erythema warmth.  No fluctuation or crepitation.  No signs of infection. No pain with calf compression, swelling, warmth, erythema.   Assessment and Plan:  Status post left foot surgery  -Treatment options discussed including all alternatives, risks, and complications -Remainder of the sutures removed today without complications.  Incisions well coapted.  Antibiotic ointment and dressing applied.  Keep the dressing clean, dry, intact. -Continue cam boot.  Continue ice, elevate as well as compression of any residual edema.  Discussed transapical is much as possible.  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  Trula Slade DPM

## 2022-01-31 ENCOUNTER — Ambulatory Visit (INDEPENDENT_AMBULATORY_CARE_PROVIDER_SITE_OTHER): Payer: 59

## 2022-01-31 ENCOUNTER — Ambulatory Visit (INDEPENDENT_AMBULATORY_CARE_PROVIDER_SITE_OTHER): Payer: 59 | Admitting: Podiatry

## 2022-01-31 DIAGNOSIS — M21962 Unspecified acquired deformity of left lower leg: Secondary | ICD-10-CM

## 2022-01-31 DIAGNOSIS — Z9889 Other specified postprocedural states: Secondary | ICD-10-CM

## 2022-01-31 DIAGNOSIS — D3610 Benign neoplasm of peripheral nerves and autonomic nervous system, unspecified: Secondary | ICD-10-CM

## 2022-02-02 NOTE — Progress Notes (Signed)
Subjective: Chief Complaint  Patient presents with   Routine Post Op    POV#4 neuroma left foot, patient is still having t=some pain and cramping in the foot, rate of pain is a 4 out of 10, patient denies any N/V/F/C/SOB     Rachael Jordan is a 66 y.o. is seen today in office s/p left foot excision soft tissue mass, decompression neuroma 1st and 2nd interspace, third metatarsal osteotomy preformed on 04/04/2021.  States that she still getting pain to her foot but she does admit that she has been on her feet more.  No fevers or chills.    Objective: General: No acute distress, AAOx3  DP/PT pulses palpable 2/4, CRT < 3 sec to all digits.  Protective sensation intact. Motor function intact.  LEFT foot: Incision is well coapted without any evidence of dehiscence.there is no sign of edema there is no erythema or warmth.  There is no drainage or pus.  Mild diffuse tenderness to the forefoot.   No pain with calf compression, swelling, warmth, erythema.   Assessment and Plan:  Status post left foot surgery  -Treatment options discussed including all alternatives, risks, and complications -X-rays obtained reviewed.  Osteotomy of the third metatarsal with angulation.  There is bone callus formation noted today.  Discussed this previously with other providers in the group.  Clinically the toes in rectus position and the pressure has been elevated. -Continue the cam boot for now.  Continue ice, elevate as well as compression to help with edema.  Discussed that she needs to limit the amount of time that she is on her feet to allow this to heal.  Return in about 3 months (around 05/03/2022).  Repeat x-ray  Trula Slade DPM

## 2022-02-04 DIAGNOSIS — G4733 Obstructive sleep apnea (adult) (pediatric): Secondary | ICD-10-CM | POA: Diagnosis not present

## 2022-02-04 DIAGNOSIS — J4530 Mild persistent asthma, uncomplicated: Secondary | ICD-10-CM | POA: Diagnosis not present

## 2022-02-11 ENCOUNTER — Encounter: Payer: Self-pay | Admitting: Physician Assistant

## 2022-02-11 ENCOUNTER — Ambulatory Visit (INDEPENDENT_AMBULATORY_CARE_PROVIDER_SITE_OTHER): Payer: 59 | Admitting: Physician Assistant

## 2022-02-11 ENCOUNTER — Other Ambulatory Visit: Payer: Self-pay | Admitting: Podiatry

## 2022-02-11 DIAGNOSIS — G4733 Obstructive sleep apnea (adult) (pediatric): Secondary | ICD-10-CM

## 2022-02-11 DIAGNOSIS — R0602 Shortness of breath: Secondary | ICD-10-CM | POA: Diagnosis not present

## 2022-02-11 DIAGNOSIS — Z9889 Other specified postprocedural states: Secondary | ICD-10-CM

## 2022-02-11 DIAGNOSIS — J4520 Mild intermittent asthma, uncomplicated: Secondary | ICD-10-CM | POA: Diagnosis not present

## 2022-02-11 DIAGNOSIS — D3610 Benign neoplasm of peripheral nerves and autonomic nervous system, unspecified: Secondary | ICD-10-CM

## 2022-02-11 DIAGNOSIS — M21962 Unspecified acquired deformity of left lower leg: Secondary | ICD-10-CM

## 2022-02-11 DIAGNOSIS — Z7189 Other specified counseling: Secondary | ICD-10-CM | POA: Diagnosis not present

## 2022-02-11 NOTE — Progress Notes (Signed)
[facility] [place], [place] [address]  Pulmonary Sleep Medicine   Office Visit Note  Patient Name: Rachael Jordan DOB: [birth year] MRN [id number]  Date of Service: 02/11/2022  Complaints/HPI: Pt is here for routine follow up. Did have one instance of chest tightness and SOB on Thursday, by the weekend was better. Did not use her albuterol inhaler. States she didn't think about using it at the time. She admits she has not been using her symbicort regularly either except for a few weeks ago when she had a URI but then stopped again. Using CPAP most nights, but dropped off when sick. Last download shows 94% compliance with AHI slightly up at 5.8. She is still adjusting to use and finding appropriate mask fit without leak. Will continue to work on this. Had previously discussed considering FF mask due to likely mouth venting causing dry mouth. Will monitor and may need to increase pressure if not improving with mask adjustment.  ROS  General: (-) fever, (-) chills, (-) night sweats, (-) weakness Skin: (-) rashes, (-) itching,. Eyes: (-) visual changes, (-) redness, (-) itching. Nose and Sinuses: (-) nasal stuffiness or itchiness, (-) postnasal drip, (-) nosebleeds, (-) sinus trouble. Mouth and Throat: (-) sore throat, (-) hoarseness. Neck: (-) swollen glands, (-) enlarged thyroid, (-) neck pain. Respiratory: - cough, (-) bloody sputum, - shortness of breath, - wheezing. Cardiovascular: - ankle swelling, (-) chest pain. Lymphatic: (-) lymph node enlargement. Neurologic: (-) numbness, (-) tingling. Psychiatric: (-) anxiety, (-) depression   Current Medication: Outpatient Encounter Medications as of 02/11/2022  Medication Sig   albuterol (PROVENTIL) (2.5 MG/3ML) 0.083% nebulizer solution Take 2.5 mg by nebulization every 6 (six) hours as needed for wheezing or shortness of breath.   albuterol (VENTOLIN HFA) 108 (90 Base) MCG/ACT inhaler Inhale 2 puffs into the  lungs every 6 (six) hours as needed for wheezing or shortness of breath.   amLODipine (NORVASC) 5 MG tablet Take 5 mg by mouth daily.   ammonium lactate (AMLACTIN) 12 % lotion Apply 1 application topically as needed for dry skin.   aspirin EC 81 MG tablet Take 81 mg by mouth daily.   atorvastatin (LIPITOR) 20 MG tablet Take 20 mg by mouth daily.   azelastine (ASTELIN) 0.1 % nasal spray Place 1-2 sprays into both nostrils 2 (two) times daily.    Biotin 10000 MCG TABS Take 10 mg by mouth daily.    budesonide-formoterol (SYMBICORT) 80-4.5 MCG/ACT inhaler Inhale 2 puffs into the lungs 2 (two) times daily.   cephALEXin (KEFLEX) 500 MG capsule Take 1 capsule (500 mg total) by mouth 3 (three) times daily.   cetirizine (ZYRTEC) 10 MG tablet Take 10 mg by mouth at bedtime.    Cholecalciferol 25 MCG (1000 UT) tablet Take 1,000 Units by mouth daily.    ciclopirox (PENLAC) 8 % solution Apply topically at bedtime. Apply over nail and surrounding skin. Apply daily over previous coat. After seven (7) days, may remove with alcohol and continue cycle.   Cyanocobalamin (B-12) 1000 MCG TABS Take by mouth daily.   diclofenac Sodium (VOLTAREN) 1 % GEL Apply topically.   gabapentin (NEURONTIN) 800 MG tablet Take 800 mg by mouth daily as needed. Causes hair loss   HYDROcodone-acetaminophen (NORCO) 10-325 MG tablet Take 1 tablet by mouth every 4 (four) hours as needed.   ipratropium (ATROVENT) 0.06 % nasal spray Place into the nose.   lidocaine (LIDODERM) 5 % Place 1 patch onto the skin every 12 (twelve)  hours. Remove & Discard patch within 12 hours or as directed by MD   mirabegron ER (MYRBETRIQ) 50 MG TB24 tablet Take 1 tablet (50 mg total) by mouth daily.   montelukast (SINGULAIR) 10 MG tablet Take 10 mg by mouth in the morning.   Multiple Vitamins-Minerals (CENTRUM ADULTS PO) Take 1 tablet by mouth daily.    naloxone (NARCAN) nasal spray 4 mg/0.1 mL SMARTSIG:Both Nares   naloxone (NARCAN) nasal spray 4 mg/0.1 mL  Take as prescribed for opoid overdose   nortriptyline (PAMELOR) 10 MG capsule Take 10 mg by mouth 2 (two) times daily.   oxyCODONE-acetaminophen (PERCOCET) 7.5-325 MG tablet Take 1 tablet by mouth every 4 (four) hours as needed.   oxymorphone (OPANA) 10 MG tablet 10 mg every 8 (eight) hours as needed.   pantoprazole (PROTONIX) 40 MG tablet Take 40 mg by mouth daily.   promethazine (PHENERGAN) 25 MG tablet Take 1 tablet (25 mg total) by mouth every 8 (eight) hours as needed for nausea or vomiting.   pyridOXINE (VITAMIN B-6) 100 MG tablet Take 100 mg by mouth daily.   rizatriptan (MAXALT) 10 MG tablet Take 10 mg by mouth as needed for migraine. May repeat in 2 hours if needed   tiZANidine (ZANAFLEX) 4 MG tablet Take by mouth.   traZODone (DESYREL) 100 MG tablet Take 200 mg by mouth at bedtime.    valACYclovir (VALTREX) 1000 MG tablet Take 1,000 mg by mouth daily as needed.   vitamin C (ASCORBIC ACID) 500 MG tablet Take 500 mg by mouth daily.   Vitamin E 45 MG CAPS Take 90 mg by mouth daily.   Facility-Administered Encounter Medications as of 02/11/2022  Medication   betamethasone acetate-betamethasone sodium phosphate (CELESTONE) injection 3 mg    Surgical History: Past Surgical History:  Procedure Laterality Date   ABDOMINAL HYSTERECTOMY     vaginal   APPENDECTOMY     BREAST BIOPSY     COLONOSCOPY N/A 01/02/2020   Procedure: COLONOSCOPY;  Surgeon: Toledo, Benay Pike, MD;  Location: [facility] ENDOSCOPY;  Service: Gastroenterology;  Laterality: N/A;   COLONOSCOPY WITH PROPOFOL N/A 12/29/2018   Procedure: COLONOSCOPY WITH PROPOFOL;  Surgeon: Lucilla Lame, MD;  Location[facility] ENDOSCOPY;  Service: Endoscopy;  Laterality: N/A;   ESOPHAGOGASTRODUODENOSCOPY N/A 01/02/2020   Procedure: ESOPHAGOGASTRODUODENOSCOPY (EGD);  Surgeon: Toledo, Benay Pike, MD;  Location: [facility] ENDOSCOPY;  Service: Gastroenterology;  Laterality: N/A;   FOOT SURGERY Left    10/2015   NECK SURGERY      Medical History: Past  Medical History:  Diagnosis Date   Acid reflux    Anxiety    Arthritis    Asthma    Bronchitis 09/2015   CVA (cerebral vascular accident) ([place])    Gross hematuria    H. pylori infection    Heart murmur    Kidney failure    Labile essential hypertension 01/06/2018   Lung abnormality    "damaged lung due to pneumonia"   Neck pain    Nephrolithiasis    Pneumonia    Pneumothorax    Scoliosis    Seasonal allergies    Urinary frequency    Vaginal atrophy     Family History: Family History  Problem Relation Age of Onset   Arthritis Mother    Asthma Mother    Hyperlipidemia Mother    Hypertension Mother    Diabetes Sister    Cancer Maternal Aunt        esophagus   Breast cancer Neg Hx  Kidney cancer Neg Hx    Bladder Cancer Neg Hx     Social History: Social History   Socioeconomic History   Marital status: Legally Separated    Spouse name: Not on file   Number of children: Not on file   Years of education: Not on file   Highest education level: Not on file  Occupational History   Not on file  Tobacco Use   Smoking status: Never   Smokeless tobacco: Never  Vaping Use   Vaping Use: Never used  Substance and Sexual Activity   Alcohol use: No    Alcohol/week: 0.0 standard drinks of alcohol   Drug use: No   Sexual activity: Not on file  Other Topics Concern   Not on file  Social History Narrative   Not on file   Social Determinants of Health   Financial Resource Strain: Not on file  Food Insecurity: Not on file  Transportation Needs: Not on file  Physical Activity: Not on file  Stress: Not on file  Social Connections: Not on file  Intimate Partner Violence: Not on file    Vital Signs: Blood pressure 128/84, temperature 97.8 F (36.6 C), resp. rate 16, height 5' 5"  (1.651 m), weight 152 lb (68.9 kg), SpO2 97 %.  Examination: General Appearance: The patient is well-developed, well-nourished, and in no distress. Skin: Gross inspection of skin  unremarkable. Head: normocephalic, no gross deformities. Eyes: no gross deformities noted. ENT: ears appear grossly normal no exudates. Neck: Supple. No thyromegaly. No LAD. Respiratory: Lungs clear to auscultation bilaterally. Cardiovascular: Normal S1 and S2 without murmur or rub. Extremities: No cyanosis. pulses are equal. Neurologic: Alert and oriented. No involuntary movements.  LABS: No results found for this or any previous visit (from the past 2160 hour(s)).  Radiology: DG Thoracic Spine 2 View  Result Date: 12/15/2021 CLINICAL DATA:  Thoracic pain EXAM: THORACIC SPINE 2 VIEWS COMPARISON:  Thoracic spine MRI 09/27/2017 FINDINGS: No acute fracture or traumatic malalignment. Slight thoracic curve. Chronic wedging of T12. Mild multilevel spondylosis disc space height loss. ACDF C5-C6. IMPRESSION: No acute fracture or traumatic malalignment. Mild multilevel spondylosis. Electronically Signed   By: Placido Sou M.D.   On: 12/15/2021 00:22   DG Cervical Spine Complete  Result Date: 12/15/2021 CLINICAL DATA:  Status post ACDF left-sided neck pain for 4 months EXAM: CERVICAL SPINE - COMPLETE 4+ VIEW COMPARISON:  CT cervical spine 02/18/2019 FINDINGS: No acute fracture or traumatic malalignment. ACDF C5-C6. Advanced multilevel spondylosis and disc space height loss, progressed from 02/18/2019. This is greatest at C3-C4 and C4-C5. Chronic loss of normal cervical lordosis. No evidence of instability on flexion or extension views. Normal prevertebral soft tissues. The dens is well positioned between the lateral masses of C1. IMPRESSION: ACDF C5-C6. Advanced spondylosis and disc space height loss greatest at C3-C4 and C4-C5. Electronically Signed   By: Placido Sou M.D.   On: 12/15/2021 00:20    No results found.  DG Foot Complete Left  Result Date: 01/14/2022 Please see detailed radiograph report in office note.     Assessment and Plan: Patient Active Problem List   Diagnosis Date  Noted   Neuropathic pain of right lower extremity 04/16/2021   Ischemic colitis [facility])    Rectal bleeding    Left lower quadrant abdominal pain    Lower GI bleed    Hypotension    Acute colitis 12/30/2019   Lumbar herniated disc 06/16/2019   Acute nonintractable headache    Multiple falls  02/20/2019   Myoclonic jerking 02/20/2019   COPD (chronic obstructive pulmonary disease) ([place]) 02/20/2019   Seasonal allergies 02/20/2019   Insomnia 02/20/2019   Depression with anxiety 02/20/2019   AKI (acute kidney injury) ([place]) 02/20/2019   Dizziness 02/18/2019   Encounter for screening colonoscopy    Bunion 05/06/2018   Metatarsalgia of right foot 05/06/2018   Age-related osteoporosis without current pathological fracture 03/31/2018   False positive ana 03/31/2018   Mouth dryness 02/18/2018   Screening for osteoporosis 02/18/2018   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/2018   Essential hypertension 01/06/2018   Lacunar infarction ([place]) 01/06/2018   Compression fracture of thoracic vertebra, sequela 07/12/2017   Lumbar compression fracture, sequela 07/12/2017   Nocturnal hypoxia 07/12/2017   Simple chronic bronchitis ([place]) 07/12/2017   Intractable back pain 06/05/2017   Overweight (BMI 25.0-29.9) 04/08/2017   Chronic pain syndrome 04/06/2017   Sensory disturbance 01/08/2017   Pain in the groin, right 10/28/2016   Paresthesia of both hands 12/14/2015   B12 deficiency 10/31/2015   SOB (shortness of breath) 08/29/2015   History of hematuria 07/12/2015   Urinary frequency 07/12/2015   Vaginal atrophy 07/12/2015   HAV (hallux abducto valgus) 07/02/2015   Overlapping toe 07/02/2015   Hammertoe 07/02/2015   LBP (low back pain) 01/17/2015   Trochanteric bursitis of right hip 01/17/2015   PNA (pneumonia) 12/28/2014   DDD (degenerative disc disease), lumbar 08/15/2014   Chronic radicular lumbar pain 08/15/2014   Spinal stenosis, lumbar region, with neurogenic claudication  08/15/2014   DDD (degenerative disc disease), cervical 08/15/2014   Bilateral occipital neuralgia 08/15/2014   Migraine 08/15/2014   Sacroiliac joint dysfunction 08/15/2014   Back pain, chronic 09/23/2013   Chronic cervical pain 09/23/2013   Arthralgia of temporomandibular joint 08/29/2013   Absolute anemia 08/15/2013   Chronic kidney disease (CKD), stage III (moderate) ([employer]) 08/15/2013   Abnormal serum level of alkaline phosphatase 08/15/2013   Blush 08/15/2013   Hematuria, microscopic 08/15/2013   Inflamed nasal mucosa 08/15/2013   Big thyroid 08/15/2013   Disease of thyroid gland 08/15/2013   Positive H. pylori test 08/15/2013   Gastrointestinal ulcer due to Helicobacter pylori 80/05/4915   Other specified bacterial intestinal infections 08/14/2013   Acute kidney injury superimposed on CKD ([place]) 08/09/2013   SIRS (systemic inflammatory response syndrome) ([place]) 08/09/2013   Systemic inflammatory response syndrome (SIRS) ([place]) 08/09/2013    1. Obstructive sleep apnea Continue excellent compliance  2. CPAP use counseling CPAP couseling-Discussed importance of adequate CPAP use as well as proper care and cleaning techniques of machine and all supplies.  3. Mild intermittent asthma without complication Continue inhalers as prescribed  4. Shortness of breath - Spirometry with Graph is normal   General Counseling: I have discussed the findings of the evaluation and examination with Rachael Jordan.  I have also discussed any further diagnostic evaluation thatmay be needed or ordered today. Rachael Jordan verbalizes understanding of the findings of todays visit. We also reviewed her medications today and discussed drug interactions and side effects including but not limited excessive drowsiness and altered mental states. We also discussed that there is always a risk not just to her but also people around her. she has been encouraged to call the office with any questions or concerns that should  arise related to todays visit.  Orders Placed This Encounter  Procedures   Spirometry with Graph    Order Specific Question:   Where should this test be performed?    Answer:   Other  Time spent: 30  I have personally obtained a history, examined the patient, evaluated laboratory and imaging results, formulated the assessment and plan and placed orders. This patient was seen by Drema Dallas, PA-C in collaboration with Dr. Devona Konig as a part of collaborative care agreement.     Allyne Gee, MD [facility] Pulmonary and Critical Care Sleep medicine

## 2022-02-21 ENCOUNTER — Ambulatory Visit (INDEPENDENT_AMBULATORY_CARE_PROVIDER_SITE_OTHER): Payer: 59 | Admitting: Podiatry

## 2022-02-21 DIAGNOSIS — M21962 Unspecified acquired deformity of left lower leg: Secondary | ICD-10-CM

## 2022-02-21 DIAGNOSIS — D3610 Benign neoplasm of peripheral nerves and autonomic nervous system, unspecified: Secondary | ICD-10-CM

## 2022-02-21 MED ORDER — CICLOPIROX 8 % EX SOLN: Freq: Every day | CUTANEOUS | 2 refills | Status: DC

## 2022-02-21 NOTE — Progress Notes (Signed)
Subjective: Chief Complaint  Patient presents with   Foot Pain    Patient came in today for a follow-up, left foot neuroma, patient has numbness and aches, rate of pain 5 out of 10, X-Rays taken today     Rachael Jordan is a 66 y.o. is seen today in office s/p left foot excision soft tissue mass, decompression neuroma 1st and 2nd interspace, third metatarsal osteotomy preformed on 04/04/2021.  She states that she feels that she is doing better compared to last appointment.  She still in the cam boot.  She is continue ice and elevate.    Objective: General: No acute distress, AAOx3  DP/PT pulses palpable 2/4, CRT < 3 sec to all digits.  Protective sensation intact. Motor function intact.  LEFT foot: Incision is well coapted without any evidence of dehiscence.there is trace edema.  No erythema or warmth.  Mild tenderness to palpation mostly along the osteotomy site but no significant tenderness otherwise.  MMT 5/5.  Toes in rectus position.  No pain with calf compression, swelling, warmth, erythema.   Assessment and Plan:  Status post left foot surgery  -Treatment options discussed including all alternatives, risks, and complications -X-rays obtained reviewed.  Osteotomy of the third metatarsal with angulation.  Bone callus formation is noted today. -At this time we will transition to a surgical shoe which was dispensed.  Continue ice, elevate as well as compression to help with any residual edema.  Pain medication as needed.  -Likely to do physical therapy starting next appointment  *X-ray next appointment, likely referral to physical therapy  Trula Slade DPM

## 2022-02-22 ENCOUNTER — Telehealth: Payer: Self-pay | Admitting: *Deleted

## 2022-02-22 NOTE — Telephone Encounter (Signed)
Patient is calling because her feet are aching so bad after coming out in the cold for her visit one day ago. She is taking medicine as instructed. Is there something else to prescribe? Please advise.

## 2022-02-24 DIAGNOSIS — G4733 Obstructive sleep apnea (adult) (pediatric): Secondary | ICD-10-CM | POA: Diagnosis not present

## 2022-02-24 DIAGNOSIS — J4530 Mild persistent asthma, uncomplicated: Secondary | ICD-10-CM | POA: Diagnosis not present

## 2022-03-21 ENCOUNTER — Ambulatory Visit (INDEPENDENT_AMBULATORY_CARE_PROVIDER_SITE_OTHER): Payer: 59

## 2022-03-21 ENCOUNTER — Ambulatory Visit (INDEPENDENT_AMBULATORY_CARE_PROVIDER_SITE_OTHER): Payer: 59 | Admitting: Podiatry

## 2022-03-21 VITALS — BP 140/68

## 2022-03-21 DIAGNOSIS — D3610 Benign neoplasm of peripheral nerves and autonomic nervous system, unspecified: Secondary | ICD-10-CM | POA: Diagnosis not present

## 2022-03-21 DIAGNOSIS — M21962 Unspecified acquired deformity of left lower leg: Secondary | ICD-10-CM

## 2022-03-21 DIAGNOSIS — Z9889 Other specified postprocedural states: Secondary | ICD-10-CM | POA: Diagnosis not present

## 2022-03-21 DIAGNOSIS — M779 Enthesopathy, unspecified: Secondary | ICD-10-CM | POA: Diagnosis not present

## 2022-03-21 MED ORDER — LIDOCAINE 5 % EX PTCH: 1.0000 | MEDICATED_PATCH | CUTANEOUS | 0 refills | Status: AC

## 2022-03-21 MED ORDER — CICLOPIROX 8 % EX SOLN: Freq: Every day | CUTANEOUS | 2 refills | Status: DC

## 2022-03-21 NOTE — Progress Notes (Signed)
Subjective: Chief Complaint  Patient presents with   Foot Pain    Left foot pain and burning on the top and bottom of the foot rat of pain 7 out of 10, X-rays taken today     Rachael Jordan is a 67 y.o. is seen today in office s/p left foot excision soft tissue mass, decompression neuroma 1st and 2nd interspace, third metatarsal osteotomy preformed on 04/04/2021.  Her niece stepped on the foot on Christmas Day. After this it made the symptoms more noticable. She still gets singling and burning. It also "aches" inside. She also states she has been on her foot a lot.   She is still on pain medication and gabapentin but even with this she gets burning to other areas.  Objective: General: No acute distress, AAOx3  DP/PT pulses palpable 2/4, CRT < 3 sec to all digits.  Protective sensation intact. Motor function intact.  LEFT foot: Incision is well coapted without any evidence of dehiscence. There is trace edema.  No erythema or warmth.  Moderate tenderness is actually on the first interspace and she is going more than burning sensations.  More right aching sensation only.  The osteotomy was.  MMT 5/5.  Toes in rectus position.  No pain with calf compression, swelling, warmth, erythema.   Assessment and Plan:  Status post left foot surgery  -Treatment options discussed including all alternatives, risks, and complications -X-rays obtained reviewed.  Osteotomy of the third metatarsal with angulation.  Bone callus formation is noted today, which is increased. -She is already on multiple medications so we will hold off on any other oral medication.  Prescribed lidocaine patches to apply. -Start physical therapy.  Referral placed.  Trula Slade DPM

## 2022-03-27 ENCOUNTER — Telehealth: Payer: Self-pay | Admitting: Urology

## 2022-03-27 DIAGNOSIS — G4733 Obstructive sleep apnea (adult) (pediatric): Secondary | ICD-10-CM | POA: Diagnosis not present

## 2022-03-27 DIAGNOSIS — J4530 Mild persistent asthma, uncomplicated: Secondary | ICD-10-CM | POA: Diagnosis not present

## 2022-03-27 NOTE — Telephone Encounter (Signed)
Pt LMOM that she went to pick up refill for Myrbetriq and she can't afford it.  Her insurance company wants her to try something different, like a generic.  She also said she can't take Oxybutynin.

## 2022-03-27 NOTE — Telephone Encounter (Signed)
I spoke to patient and her insurance has not changed. Please advise

## 2022-03-28 NOTE — Telephone Encounter (Signed)
Spoke to patient and scheduled her for a 1 year follow up to discuss medication options.

## 2022-03-29 DIAGNOSIS — M797 Fibromyalgia: Secondary | ICD-10-CM | POA: Diagnosis not present

## 2022-04-04 ENCOUNTER — Telehealth: Payer: Self-pay | Admitting: Podiatry

## 2022-04-04 NOTE — Telephone Encounter (Signed)
Referral was sent over today. 

## 2022-04-04 NOTE — Telephone Encounter (Signed)
Patient called and stated that she would like her PT referral sent to [facility] in [place] since ita closer to her home.     [facility] [place]  Fax # [phone]

## 2022-04-08 ENCOUNTER — Emergency Department: Payer: 59

## 2022-04-08 ENCOUNTER — Inpatient Hospital Stay
Admission: EM | Admit: 2022-04-08 | Discharge: 2022-04-09 | DRG: 563 | Disposition: A | Discharge status: Other Acute Inpatient Hospital | Payer: 59 | Attending: Internal Medicine | Admitting: Internal Medicine

## 2022-04-08 DIAGNOSIS — Z800 Family history of malignant neoplasm of digestive organs: Secondary | ICD-10-CM | POA: Diagnosis not present

## 2022-04-08 DIAGNOSIS — M549 Dorsalgia, unspecified: Secondary | ICD-10-CM | POA: Diagnosis present

## 2022-04-08 DIAGNOSIS — S82102A Unspecified fracture of upper end of left tibia, initial encounter for closed fracture: Secondary | ICD-10-CM

## 2022-04-08 DIAGNOSIS — I10 Essential (primary) hypertension: Secondary | ICD-10-CM | POA: Diagnosis not present

## 2022-04-08 DIAGNOSIS — Z888 Allergy status to other drugs, medicaments and biological substances status: Secondary | ICD-10-CM

## 2022-04-08 DIAGNOSIS — Z79891 Long term (current) use of opiate analgesic: Secondary | ICD-10-CM

## 2022-04-08 DIAGNOSIS — F419 Anxiety disorder, unspecified: Secondary | ICD-10-CM | POA: Diagnosis present

## 2022-04-08 DIAGNOSIS — S82252A Displaced comminuted fracture of shaft of left tibia, initial encounter for closed fracture: Secondary | ICD-10-CM | POA: Diagnosis not present

## 2022-04-08 DIAGNOSIS — I129 Hypertensive chronic kidney disease with stage 1 through stage 4 chronic kidney disease, or unspecified chronic kidney disease: Secondary | ICD-10-CM | POA: Diagnosis not present

## 2022-04-08 DIAGNOSIS — F32A Depression, unspecified: Secondary | ICD-10-CM | POA: Diagnosis present

## 2022-04-08 DIAGNOSIS — S82101A Unspecified fracture of upper end of right tibia, initial encounter for closed fracture: Secondary | ICD-10-CM | POA: Diagnosis not present

## 2022-04-08 DIAGNOSIS — S82122A Displaced fracture of lateral condyle of left tibia, initial encounter for closed fracture: Secondary | ICD-10-CM | POA: Diagnosis not present

## 2022-04-08 DIAGNOSIS — Z885 Allergy status to narcotic agent status: Secondary | ICD-10-CM | POA: Diagnosis not present

## 2022-04-08 DIAGNOSIS — J45909 Unspecified asthma, uncomplicated: Secondary | ICD-10-CM | POA: Diagnosis present

## 2022-04-08 DIAGNOSIS — Z83438 Family history of other disorder of lipoprotein metabolism and other lipidemia: Secondary | ICD-10-CM | POA: Diagnosis not present

## 2022-04-08 DIAGNOSIS — Z833 Family history of diabetes mellitus: Secondary | ICD-10-CM

## 2022-04-08 DIAGNOSIS — W19XXXA Unspecified fall, initial encounter: Secondary | ICD-10-CM | POA: Diagnosis not present

## 2022-04-08 DIAGNOSIS — Z743 Need for continuous supervision: Secondary | ICD-10-CM | POA: Diagnosis not present

## 2022-04-08 DIAGNOSIS — R69 Illness, unspecified: Secondary | ICD-10-CM | POA: Diagnosis not present

## 2022-04-08 DIAGNOSIS — W010XXA Fall on same level from slipping, tripping and stumbling without subsequent striking against object, initial encounter: Secondary | ICD-10-CM | POA: Diagnosis present

## 2022-04-08 DIAGNOSIS — Z7951 Long term (current) use of inhaled steroids: Secondary | ICD-10-CM

## 2022-04-08 DIAGNOSIS — R0689 Other abnormalities of breathing: Secondary | ICD-10-CM | POA: Diagnosis not present

## 2022-04-08 DIAGNOSIS — Z79899 Other long term (current) drug therapy: Secondary | ICD-10-CM

## 2022-04-08 DIAGNOSIS — Z8673 Personal history of transient ischemic attack (TIA), and cerebral infarction without residual deficits: Secondary | ICD-10-CM | POA: Diagnosis not present

## 2022-04-08 DIAGNOSIS — G894 Chronic pain syndrome: Secondary | ICD-10-CM | POA: Diagnosis present

## 2022-04-08 DIAGNOSIS — Z87442 Personal history of urinary calculi: Secondary | ICD-10-CM

## 2022-04-08 DIAGNOSIS — S82142A Displaced bicondylar fracture of left tibia, initial encounter for closed fracture: Secondary | ICD-10-CM | POA: Diagnosis not present

## 2022-04-08 DIAGNOSIS — M1990 Unspecified osteoarthritis, unspecified site: Secondary | ICD-10-CM | POA: Diagnosis present

## 2022-04-08 DIAGNOSIS — Z8261 Family history of arthritis: Secondary | ICD-10-CM | POA: Diagnosis not present

## 2022-04-08 DIAGNOSIS — Z1152 Encounter for screening for COVID-19: Secondary | ICD-10-CM | POA: Diagnosis not present

## 2022-04-08 DIAGNOSIS — S82132A Displaced fracture of medial condyle of left tibia, initial encounter for closed fracture: Principal | ICD-10-CM

## 2022-04-08 DIAGNOSIS — G4733 Obstructive sleep apnea (adult) (pediatric): Secondary | ICD-10-CM

## 2022-04-08 DIAGNOSIS — Y92019 Unspecified place in single-family (private) house as the place of occurrence of the external cause: Secondary | ICD-10-CM | POA: Diagnosis not present

## 2022-04-08 DIAGNOSIS — E663 Overweight: Secondary | ICD-10-CM | POA: Diagnosis not present

## 2022-04-08 DIAGNOSIS — M419 Scoliosis, unspecified: Secondary | ICD-10-CM | POA: Diagnosis not present

## 2022-04-08 DIAGNOSIS — Z8249 Family history of ischemic heart disease and other diseases of the circulatory system: Secondary | ICD-10-CM

## 2022-04-08 DIAGNOSIS — M2500 Hemarthrosis, unspecified joint: Secondary | ICD-10-CM

## 2022-04-08 DIAGNOSIS — Z825 Family history of asthma and other chronic lower respiratory diseases: Secondary | ICD-10-CM

## 2022-04-08 DIAGNOSIS — M79606 Pain in leg, unspecified: Secondary | ICD-10-CM | POA: Diagnosis not present

## 2022-04-08 DIAGNOSIS — J4489 Other specified chronic obstructive pulmonary disease: Secondary | ICD-10-CM | POA: Diagnosis present

## 2022-04-08 DIAGNOSIS — K219 Gastro-esophageal reflux disease without esophagitis: Secondary | ICD-10-CM | POA: Diagnosis present

## 2022-04-08 DIAGNOSIS — Z635 Disruption of family by separation and divorce: Secondary | ICD-10-CM

## 2022-04-08 DIAGNOSIS — G8929 Other chronic pain: Secondary | ICD-10-CM | POA: Diagnosis present

## 2022-04-08 DIAGNOSIS — F418 Other specified anxiety disorders: Secondary | ICD-10-CM | POA: Diagnosis present

## 2022-04-08 DIAGNOSIS — Z7982 Long term (current) use of aspirin: Secondary | ICD-10-CM

## 2022-04-08 LAB — CBC WITH DIFFERENTIAL/PLATELET
Abs Immature Granulocytes: 0.08 10*3/uL — ABNORMAL HIGH (ref 0.00–0.07)
Basophils Absolute: 0.1 10*3/uL (ref 0.0–0.1)
Basophils Relative: 0 %
Eosinophils Absolute: 0 10*3/uL (ref 0.0–0.5)
Eosinophils Relative: 0 %
HCT: 34.1 % — ABNORMAL LOW (ref 36.0–46.0)
Hemoglobin: 11.4 g/dL — ABNORMAL LOW (ref 12.0–15.0)
Immature Granulocytes: 0 %
Lymphocytes Relative: 12 %
Lymphs Abs: 2.2 10*3/uL (ref 0.7–4.0)
MCH: 29.8 pg (ref 26.0–34.0)
MCHC: 33.4 g/dL (ref 30.0–36.0)
MCV: 89 fL (ref 80.0–100.0)
Monocytes Absolute: 1.3 10*3/uL — ABNORMAL HIGH (ref 0.1–1.0)
Monocytes Relative: 7 %
Neutro Abs: 14.7 10*3/uL — ABNORMAL HIGH (ref 1.7–7.7)
Neutrophils Relative %: 81 %
Platelets: 424 10*3/uL — ABNORMAL HIGH (ref 150–400)
RBC: 3.83 MIL/uL — ABNORMAL LOW (ref 3.87–5.11)
RDW: 13.1 % (ref 11.5–15.5)
WBC: 18.3 10*3/uL — ABNORMAL HIGH (ref 4.0–10.5)
nRBC: 0 % (ref 0.0–0.2)

## 2022-04-08 LAB — BASIC METABOLIC PANEL
Anion gap: 12 (ref 5–15)
BUN: 10 mg/dL (ref 8–23)
CO2: 20 mmol/L — ABNORMAL LOW (ref 22–32)
Calcium: 9.3 mg/dL (ref 8.9–10.3)
Chloride: 105 mmol/L (ref 98–111)
Creatinine, Ser: 0.73 mg/dL (ref 0.44–1.00)
GFR, Estimated: >60 mL/min (ref >60)
Glucose, Bld: 118 mg/dL — ABNORMAL HIGH (ref 70–99)
Potassium: 3 mmol/L — ABNORMAL LOW (ref 3.5–5.1)
Sodium: 137 mmol/L (ref 135–145)

## 2022-04-08 MED ORDER — HYDROMORPHONE HCL 1 MG/ML IJ SOLN
0.5000 mg | Freq: Once | INTRAMUSCULAR | Status: AC
  Administered 2022-04-08: 0.5 mg via INTRAVENOUS
  Filled 2022-04-08: qty 0.5

## 2022-04-08 MED ORDER — DIAZEPAM 5 MG PO TABS
5.0000 mg | ORAL_TABLET | Freq: Once | ORAL | Status: AC
  Administered 2022-04-08: 5 mg via ORAL
  Filled 2022-04-08: qty 1

## 2022-04-08 MED ORDER — AMLODIPINE BESYLATE 5 MG PO TABS: 5.0000 mg | ORAL_TABLET | Freq: Every day | ORAL | Status: DC

## 2022-04-08 MED ORDER — FENTANYL CITRATE PF 50 MCG/ML IJ SOSY
100.0000 ug | PREFILLED_SYRINGE | Freq: Once | INTRAMUSCULAR | Status: AC
  Administered 2022-04-08: 100 ug via INTRAVENOUS
  Filled 2022-04-08: qty 2

## 2022-04-08 MED ORDER — HYDROMORPHONE HCL 1 MG/ML IJ SOLN
1.0000 mg | INTRAMUSCULAR | Status: DC | PRN
  Administered 2022-04-08 – 2022-04-09 (×3): 1 mg via INTRAVENOUS
  Filled 2022-04-08 (×3): qty 1

## 2022-04-08 MED ORDER — ATORVASTATIN CALCIUM 20 MG PO TABS: 20.0000 mg | ORAL_TABLET | Freq: Every day | ORAL | Status: DC

## 2022-04-08 MED ORDER — HYDROMORPHONE HCL 1 MG/ML IJ SOLN
1.0000 mg | Freq: Once | INTRAMUSCULAR | Status: AC
  Administered 2022-04-08: 1 mg via INTRAVENOUS
  Filled 2022-04-08: qty 1

## 2022-04-08 MED ORDER — ACETAMINOPHEN 325 MG PO TABS: 650.0000 mg | ORAL_TABLET | Freq: Four times a day (QID) | ORAL | Status: DC | PRN

## 2022-04-08 MED ORDER — OXYCODONE-ACETAMINOPHEN 7.5-325 MG PO TABS
1.0000 | ORAL_TABLET | ORAL | Status: DC | PRN
  Administered 2022-04-09: 1 via ORAL
  Filled 2022-04-08: qty 1

## 2022-04-08 MED ORDER — ALBUTEROL SULFATE (2.5 MG/3ML) 0.083% IN NEBU: 2.5000 mg | INHALATION_SOLUTION | Freq: Four times a day (QID) | RESPIRATORY_TRACT | Status: DC | PRN

## 2022-04-08 MED ORDER — ONDANSETRON HCL 4 MG PO TABS: 4.0000 mg | ORAL_TABLET | Freq: Four times a day (QID) | ORAL | Status: DC | PRN

## 2022-04-08 MED ORDER — ACETAMINOPHEN 650 MG RE SUPP: 650.0000 mg | Freq: Four times a day (QID) | RECTAL | Status: DC | PRN

## 2022-04-08 MED ORDER — MOMETASONE FURO-FORMOTEROL FUM 100-5 MCG/ACT IN AERO: 2.0000 | INHALATION_SPRAY | Freq: Two times a day (BID) | RESPIRATORY_TRACT | Status: DC

## 2022-04-08 MED ORDER — ONDANSETRON HCL 4 MG/2ML IJ SOLN: 4.0000 mg | Freq: Four times a day (QID) | INTRAMUSCULAR | Status: DC | PRN

## 2022-04-08 NOTE — Assessment & Plan Note (Signed)
Continue home maintenance inhaler Albuterol as needed

## 2022-04-08 NOTE — Assessment & Plan Note (Signed)
Continue home trazodone and nortriptyline pending med rec

## 2022-04-08 NOTE — Consult Note (Signed)
ORTHOPAEDIC CONSULTATION  REQUESTING PHYSICIAN: Athena Masse, MD  Chief Complaint: Left knee pain status post fall  HPI: Rachael Jordan is a 67 y.o. female who complains of significant left knee pain after tripping over a baby gate and landing on the left knee earlier this evening.  Patient was brought to the [facility] emergency department where x-rays and a CT of the left knee were performed which demonstrated a comminuted bicondylar tibial plateau fracture with lateral depression.  Orthopedics was contacted regarding management of her fracture.  Past Medical History:  Diagnosis Date   Acid reflux    Anxiety    Arthritis    Asthma    Bronchitis 09/2015   CVA (cerebral vascular accident) ([place])    Gross hematuria    H. pylori infection    Heart murmur    Kidney failure    Labile essential hypertension 01/06/2018   Lung abnormality    "damaged lung due to pneumonia"   Neck pain    Nephrolithiasis    Pneumonia    Pneumothorax    Scoliosis    Seasonal allergies    Urinary frequency    Vaginal atrophy    Past Surgical History:  Procedure Laterality Date   ABDOMINAL HYSTERECTOMY     vaginal   APPENDECTOMY     BREAST BIOPSY     COLONOSCOPY N/A 01/02/2020   Procedure: COLONOSCOPY;  Surgeon: Toledo, Benay Pike, MD;  Location: [facility] ENDOSCOPY;  Service: Gastroenterology;  Laterality: N/A;   COLONOSCOPY WITH PROPOFOL N/A 12/29/2018   Procedure: COLONOSCOPY WITH PROPOFOL;  Surgeon: Lucilla Lame, MD;  Location: [facility] ENDOSCOPY;  Service: Endoscopy;  Laterality: N/A;   ESOPHAGOGASTRODUODENOSCOPY N/A 01/02/2020   Procedure: ESOPHAGOGASTRODUODENOSCOPY (EGD);  Surgeon: Toledo, Benay Pike, MD;  Location: [facility] ENDOSCOPY;  Service: Gastroenterology;  Laterality: N/A;   FOOT SURGERY Left    10/2015   NECK SURGERY     Social History   Socioeconomic History   Marital status: Legally Separated    Spouse name: Not on file   Number of children: Not on file   Years of  education: Not on file   Highest education level: Not on file  Occupational History   Not on file  Tobacco Use   Smoking status: Never   Smokeless tobacco: Never  Vaping Use   Vaping Use: Never used  Substance and Sexual Activity   Alcohol use: No    Alcohol/week: 0.0 standard drinks of alcohol   Drug use: No   Sexual activity: Not on file  Other Topics Concern   Not on file  Social History Narrative   Not on file   Social Determinants of Health   Financial Resource Strain: Not on file  Food Insecurity: Not on file  Transportation Needs: Not on file  Physical Activity: Not on file  Stress: Not on file  Social Connections: Not on file   Family History  Problem Relation Age of Onset   Arthritis Mother    Asthma Mother    Hyperlipidemia Mother    Hypertension Mother    Diabetes Sister    Cancer Maternal Aunt        esophagus   Breast cancer Neg Hx    Kidney cancer Neg Hx    Bladder Cancer Neg Hx    Allergies  Allergen Reactions   Morphine And Related Itching   Oxymorphone Itching   Grapefruit Extract Rash   Oxycodone Itching   Propoxyphene Nausea And Vomiting and Nausea Only   Prior to  Admission medications   Medication Sig Start Date End Date Taking? Authorizing Provider  albuterol (PROVENTIL) (2.5 MG/3ML) 0.083% nebulizer solution Take 2.5 mg by nebulization every 6 (six) hours as needed for wheezing or shortness of breath.    [provider]  albuterol (VENTOLIN HFA) 108 (90 Base) MCG/ACT inhaler Inhale 2 puffs into the lungs every 6 (six) hours as needed for wheezing or shortness of breath. 12/07/20   McDonough, Si Gaul, PA-C  amLODipine (NORVASC) 5 MG tablet Take 5 mg by mouth daily. 04/28/20   [provider]  ammonium lactate (AMLACTIN) 12 % lotion Apply 1 application topically as needed for dry skin. 05/18/21   Trula Slade, DPM  aspirin EC 81 MG tablet Take 81 mg by mouth daily.    [provider]  atorvastatin (LIPITOR) 20  MG tablet Take 20 mg by mouth daily. 03/21/21   [provider]  azelastine (ASTELIN) 0.1 % nasal spray Place 1-2 sprays into both nostrils 2 (two) times daily.     [provider]  Biotin 10000 MCG TABS Take 10 mg by mouth daily.     [provider]  budesonide-formoterol (SYMBICORT) 80-4.5 MCG/ACT inhaler Inhale 2 puffs into the lungs 2 (two) times daily. 08/14/21   McDonough, Si Gaul, PA-C  cephALEXin (KEFLEX) 500 MG capsule Take 1 capsule (500 mg total) by mouth 3 (three) times daily. 01/02/22   Trula Slade, DPM  cetirizine (ZYRTEC) 10 MG tablet Take 10 mg by mouth at bedtime.     [provider]  Cholecalciferol 25 MCG (1000 UT) tablet Take 1,000 Units by mouth daily.     [provider]  ciclopirox (PENLAC) 8 % solution Apply topically at bedtime. Apply over nail and surrounding skin. Apply daily over previous coat. After seven (7) days, may remove with alcohol and continue cycle. 03/21/22   Trula Slade, DPM  Cyanocobalamin (B-12) 1000 MCG TABS Take by mouth daily.    [provider]  diclofenac Sodium (VOLTAREN) 1 % GEL Apply topically. 09/04/20   [provider]  gabapentin (NEURONTIN) 800 MG tablet Take 800 mg by mouth daily as needed. Causes hair loss 08/29/20   [provider]  HYDROcodone-acetaminophen (NORCO) 10-325 MG tablet Take 1 tablet by mouth every 4 (four) hours as needed. Patient not taking: Reported on 02/21/2022 03/30/21   [provider]  ipratropium (ATROVENT) 0.06 % nasal spray Place into the nose. 09/26/20   [provider]  lidocaine (LIDODERM) 5 % Place 1 patch onto the skin daily. Remove & Discard patch within 12 hours or as directed by MD 03/21/22   Trula Slade, DPM  mirabegron ER (MYRBETRIQ) 50 MG TB24 tablet Take 1 tablet (50 mg total) by mouth daily. 03/30/21   Zara Council A, PA-C  montelukast (SINGULAIR) 10 MG tablet Take 10 mg by mouth in the morning.     [provider]  Multiple Vitamins-Minerals (CENTRUM ADULTS PO) Take 1 tablet by mouth daily.     [provider]  naloxone [facility]) nasal spray 4 mg/0.1 mL SMARTSIG:Both Nares 09/04/20   [provider]  naloxone [facility]) nasal spray 4 mg/0.1 mL Take as prescribed for opoid overdose 01/02/22   Trula Slade, DPM  nortriptyline (PAMELOR) 10 MG capsule Take 10 mg by mouth 2 (two) times daily. 12/29/19   [provider]  oxyCODONE-acetaminophen (PERCOCET) 7.5-325 MG tablet Take 1 tablet by mouth every 4 (four) hours as needed. 01/02/22   Celesta Gentile  R, DPM  oxymorphone (OPANA) 10 MG tablet 10 mg every 8 (eight) hours as needed. 07/12/20   [provider]  pantoprazole (PROTONIX) 40 MG tablet Take 40 mg by mouth daily.    [provider]  promethazine (PHENERGAN) 25 MG tablet Take 1 tablet (25 mg total) by mouth every 8 (eight) hours as needed for nausea or vomiting. 01/02/22   Trula Slade, DPM  pyridOXINE (VITAMIN B-6) 100 MG tablet Take 100 mg by mouth daily.    [provider]  rizatriptan (MAXALT) 10 MG tablet Take 10 mg by mouth as needed for migraine. May repeat in 2 hours if needed    [provider]  tiZANidine (ZANAFLEX) 4 MG tablet Take by mouth. 05/25/20   [provider]  traZODone (DESYREL) 100 MG tablet Take 200 mg by mouth at bedtime.     [provider]  valACYclovir (VALTREX) 1000 MG tablet Take 1,000 mg by mouth daily as needed.    [provider]  vitamin C (ASCORBIC ACID) 500 MG tablet Take 500 mg by mouth daily.    [provider]  Vitamin E 45 MG CAPS Take 90 mg by mouth daily.    [provider]   CT Hip Left Wo Contrast  Result Date: 04/08/2022 CLINICAL DATA:  Trauma, fall EXAM: CT OF THE LEFT HIP WITHOUT CONTRAST TECHNIQUE: Multidetector CT imaging of the left hip was performed according to the standard protocol. Multiplanar CT image  reconstructions were also generated. RADIATION DOSE REDUCTION: This exam was performed according to the departmental dose-optimization program which includes automated exposure control, adjustment of the mA and/or kV according to patient size and/or use of iterative reconstruction technique. COMPARISON:  None Available. FINDINGS: Bones/Joint/Cartilage No fracture or malalignment. Left SI joint is non widened. Pubic symphysis and rami appear intact. No significant hip effusion. The joint space appears patent. Ligaments Suboptimally assessed by CT. Muscles and Tendons No significant atrophy.  No intramuscular fluid collections Soft tissues Negative IMPRESSION: No CT evidence for acute osseous abnormality of the left hip. Electronically Signed   By: Donavan Foil M.D.   On: 04/08/2022 18:59   CT Knee Left Wo Contrast  Result Date: 04/08/2022 CLINICAL DATA:  Knee injury EXAM: CT OF THE LEFT KNEE WITHOUT CONTRAST TECHNIQUE: Multidetector CT imaging of the left knee was performed according to the standard protocol. Multiplanar CT image reconstructions were also generated. RADIATION DOSE REDUCTION: This exam was performed according to the departmental dose-optimization program which includes automated exposure control, adjustment of the mA and/or kV according to patient size and/or use of iterative reconstruction technique. COMPARISON:  Radiograph 04/08/2022. FINDINGS: Bones/Joint/Cartilage Acute comminuted intra-articular proximal tibial fracture for involves both the medial and lateral tibial plateaus with fracture lucency extending through the tibial spines. About 5 mm depression of the lateral tibial plateau fracture. About 2 mm depression of the medial tibial plateau. Nondisplaced cortical fracture lucency at the posterior to posterolateral cortex of tibia extends to at least the proximal shaft, incomplete visualization of distal extent. No definitive fibular or patellar fracture. Large lipohemarthrosis. Ligaments  Suboptimally assessed by CT. Muscles and Tendons No significant atrophy. No intramuscular fluid collections. Patellar and quadriceps tendons appear grossly intact. Soft tissues Soft tissue swelling about the knee. Punctate focus of gas within the soft tissues lateral side of the knee at the level of patella, no obvious open wound in the vicinity. IMPRESSION: 1. Acute comminuted intra-articular proximal tibial fracture involves both the medial and lateral tibial plateaus  with extension of lucency through the tibial spines. Lateral greater than medial depression of fracture fragments as above. Nondisplaced fracture lucency is visualized at the posterior to posterolateral cortex of the tibia to the level of the proximal shaft but the inferior extent is incompletely visualized. There is associated large lipohemarthrosis. Electronically Signed   By: Donavan Foil M.D.   On: 04/08/2022 18:57   DG Knee Left Port  Result Date: 04/08/2022 CLINICAL DATA:  Fall EXAM: PORTABLE LEFT KNEE - 1-2 VIEW COMPARISON:  None Available. FINDINGS: There is a comminuted fracture of the lateral tibial plateau. This extends into the tibial spines in his depressed proximally 1 cm. A nondisplaced oblique fracture fragment is seen through the proximal lateral diaphysis of the tibia. There is 6 mm of posterior offset of fracture fragments. There is no evidence for dislocation. Lipohemarthrosis is present. IMPRESSION: 1. Comminuted fracture of the lateral tibial plateau with extension into the tibial spines and proximal diaphysis. 2. Lipohemarthrosis. Electronically Signed   By: Ronney Asters M.D.   On: 04/08/2022 17:33    Positive ROS: All other systems have been reviewed and were otherwise negative with the exception of those mentioned in the HPI and as above.  Physical Exam: General: Patient was seen in the emergency department room 47 this evening.  2 family members are at the bedside.  The patient is alert but having significant left  knee pain  MUSCULOSKELETAL: Left knee: Patient's skin is intact.  There is no erythema or ecchymosis.  Patient is in a knee immobilizer.  This was opened for examination purposes.  Patient's leg compartments are soft and compressible.  She does not have a large knee effusion.  She has intact sensation to light touch throughout the left lower extremity.  She has easily palpable pedal pulses.  Her foot is warm and well-perfused.  She can flex and extend her toes.  There is no evidence for compartment syndrome at this time.  Assessment: Left closed comminuted bicondylar tibial plateau fracture  Plan: I explained to the patient and her family that she has a significant fracture to her left tibial plateau.  I have contacted Dr. Altamese Northbrook, the orthopedic trauma specialist at [facility] and [facility], [place] regarding this patient. They are willing to perform the patient's definitive surgery, but have requested admission [facility] overnight for neurovascular monitoring. They requested the patient be transferred to [facility] in the AM to the hospitalist service there for pre-op clearance.  I spoke with Dr. Damita Dunnings here at [facility] who has graciously agreed to admit the patient to the hospitalist service here at [facility] and her colleagues on day shift will assist in transfer of the patient tomorrow.  The patient may possibly have surgery tomorrow.and should be NPO after midnight and should avoid any anticoagulation medication after midnight.  Patient will have neurovascular checks every 2 hours overnight.  Patient will have her fracture stabilized with a knee immobilizer and elevate the left lower extremity overnight.  She may apply ice to the left knee as tolerated.  I will be available if the patient develops severe swelling or compartment syndrome overnight.      Thornton Park, MD    04/08/2022 10:00 PM

## 2022-04-08 NOTE — Progress Notes (Deleted)
04/08/22 8:23 AM   Rachael Jordan Jun 18, 1955 [id number]  Referring provider:  Sharyne Peach, MD [address],  [place] [address]  Urological history: High risk hematuria  - non-smoker  -06/2014 hematuria work-up was negative  - CT urogram NED 10/2017  -NED 10/2017 cystoscopy  - 12/2020 renal biopsy showed sever arterial sclerosis and mild interstitial fibrosis and tubular atrophy  - 12/2019 CT showed no GU findings  -No reports of gross heme -[facility] (03/2022)   2. OAB  - contributing factors of age, vaginal atrophy, HTN, COPD, antihistamines  - discontinued PTNS treatments  -Myrbetriq was cost prohibitive  -failed trospium, darifenacin and oxybutynin -PVR *** mL   3. Incontinence  - contributing factors of age, COPD, sleep apnea, HTN, vaginal atrophy, COPD, antihistamines, chronic opioid use and depression   HPI: Rachael Jordan is a 67 y.o.female who presents today for one year follow up.     PVR *** mL  PMH: Past Medical History:  Diagnosis Date   Acid reflux    Anxiety    Arthritis    Asthma    Bronchitis 09/2015   CVA (cerebral vascular accident) ([place])    Gross hematuria    H. pylori infection    Heart murmur    Kidney failure    Labile essential hypertension 01/06/2018   Lung abnormality    "damaged lung due to pneumonia"   Neck pain    Nephrolithiasis    Pneumonia    Pneumothorax    Scoliosis    Seasonal allergies    Urinary frequency    Vaginal atrophy     Surgical History: Past Surgical History:  Procedure Laterality Date   ABDOMINAL HYSTERECTOMY     vaginal   APPENDECTOMY     BREAST BIOPSY     COLONOSCOPY N/A 01/02/2020   Procedure: COLONOSCOPY;  Surgeon: Toledo, Benay Pike, MD;  Location: [facility] ENDOSCOPY;  Service: Gastroenterology;  Laterality: N/A;   COLONOSCOPY WITH PROPOFOL N/A 12/29/2018   Procedure: COLONOSCOPY WITH PROPOFOL;  Surgeon: Lucilla Lame, MD;  Location: [facility] ENDOSCOPY;  Service: Endoscopy;  Laterality: N/A;    ESOPHAGOGASTRODUODENOSCOPY N/A 01/02/2020   Procedure: ESOPHAGOGASTRODUODENOSCOPY (EGD);  Surgeon: Toledo, Benay Pike, MD;  Location: [facility] ENDOSCOPY;  Service: Gastroenterology;  Laterality: N/A;   FOOT SURGERY Left    10/2015   NECK SURGERY      Home Medications:  Allergies as of 04/09/2022       Reactions   Morphine And Related Itching   Oxymorphone Itching   Grapefruit Extract Rash   Oxycodone Itching   Propoxyphene Nausea And Vomiting, Nausea Only        Medication List        Accurate as of April 08, 2022  8:23 AM. If you have any questions, ask your nurse or doctor.          albuterol (2.5 MG/3ML) 0.083% nebulizer solution Commonly known as: PROVENTIL Take 2.5 mg by nebulization every 6 (six) hours as needed for wheezing or shortness of breath.   albuterol 108 (90 Base) MCG/ACT inhaler Commonly known as: VENTOLIN HFA Inhale 2 puffs into the lungs every 6 (six) hours as needed for wheezing or shortness of breath.   amLODipine 5 MG tablet Commonly known as: NORVASC Take 5 mg by mouth daily.   ammonium lactate 12 % lotion Commonly known as: AmLactin Apply 1 application topically as needed for dry skin.   ascorbic acid 500 MG tablet Commonly known as: VITAMIN C Take 500 mg by  mouth daily.   aspirin EC 81 MG tablet Take 81 mg by mouth daily.   atorvastatin 20 MG tablet Commonly known as: LIPITOR Take 20 mg by mouth daily.   azelastine 0.1 % nasal spray Commonly known as: ASTELIN Place 1-2 sprays into both nostrils 2 (two) times daily.   B-12 1000 MCG Tabs Take by mouth daily.   Biotin 10000 MCG Tabs Take 10 mg by mouth daily.   budesonide-formoterol 80-4.5 MCG/ACT inhaler Commonly known as: SYMBICORT Inhale 2 puffs into the lungs 2 (two) times daily.   CENTRUM ADULTS PO Take 1 tablet by mouth daily.   cephALEXin 500 MG capsule Commonly known as: KEFLEX Take 1 capsule (500 mg total) by mouth 3 (three) times daily.   cetirizine 10 MG  tablet Commonly known as: ZYRTEC Take 10 mg by mouth at bedtime.   Cholecalciferol 25 MCG (1000 UT) tablet Take 1,000 Units by mouth daily.   ciclopirox 8 % solution Commonly known as: Penlac Apply topically at bedtime. Apply over nail and surrounding skin. Apply daily over previous coat. After seven (7) days, may remove with alcohol and continue cycle.   diclofenac Sodium 1 % Gel Commonly known as: VOLTAREN Apply topically.   gabapentin 800 MG tablet Commonly known as: NEURONTIN Take 800 mg by mouth daily as needed. Causes hair loss   HYDROcodone-acetaminophen 10-325 MG tablet Commonly known as: NORCO Take 1 tablet by mouth every 4 (four) hours as needed.   ipratropium 0.06 % nasal spray Commonly known as: ATROVENT Place into the nose.   lidocaine 5 % Commonly known as: Lidoderm Place 1 patch onto the skin daily. Remove & Discard patch within 12 hours or as directed by MD   mirabegron ER 50 MG Tb24 tablet Commonly known as: MYRBETRIQ Take 1 tablet (50 mg total) by mouth daily.   montelukast 10 MG tablet Commonly known as: SINGULAIR Take 10 mg by mouth in the morning.   naloxone 4 MG/0.1ML Liqd nasal spray kit Commonly known as: NARCAN SMARTSIG:Both Nares   naloxone 4 MG/0.1ML Liqd nasal spray kit Commonly known as: NARCAN Take as prescribed for opoid overdose   nortriptyline 10 MG capsule Commonly known as: PAMELOR Take 10 mg by mouth 2 (two) times daily.   oxyCODONE-acetaminophen 7.5-325 MG tablet Commonly known as: Percocet Take 1 tablet by mouth every 4 (four) hours as needed.   oxymorphone 10 MG tablet Commonly known as: OPANA 10 mg every 8 (eight) hours as needed.   pantoprazole 40 MG tablet Commonly known as: PROTONIX Take 40 mg by mouth daily.   promethazine 25 MG tablet Commonly known as: PHENERGAN Take 1 tablet (25 mg total) by mouth every 8 (eight) hours as needed for nausea or vomiting.   pyridOXINE 100 MG tablet Commonly known as:  VITAMIN B6 Take 100 mg by mouth daily.   rizatriptan 10 MG tablet Commonly known as: MAXALT Take 10 mg by mouth as needed for migraine. May repeat in 2 hours if needed   tiZANidine 4 MG tablet Commonly known as: ZANAFLEX Take by mouth.   traZODone 100 MG tablet Commonly known as: DESYREL Take 200 mg by mouth at bedtime.   valACYclovir 1000 MG tablet Commonly known as: VALTREX Take 1,000 mg by mouth daily as needed.   Vitamin E 45 MG Caps Take 90 mg by mouth daily.        Allergies:  Allergies  Allergen Reactions   Morphine And Related Itching   Oxymorphone Itching   Grapefruit Extract  Rash   Oxycodone Itching   Propoxyphene Nausea And Vomiting and Nausea Only    Family History: Family History  Problem Relation Age of Onset   Arthritis Mother    Asthma Mother    Hyperlipidemia Mother    Hypertension Mother    Diabetes Sister    Cancer Maternal Aunt        esophagus   Breast cancer Neg Hx    Kidney cancer Neg Hx    Bladder Cancer Neg Hx     Social History:  reports that she has never smoked. She has never used smokeless tobacco. She reports that she does not drink alcohol and does not use drugs.   Physical Exam: There were no vitals taken for this visit.  Constitutional:  Well nourished. Alert and oriented, No acute distress. HEENT: [place] AT, moist mucus membranes.  Trachea midline, no masses. Cardiovascular: No clubbing, cyanosis, or edema. Respiratory: Normal respiratory effort, no increased work of breathing. GU: No CVA tenderness.  No bladder fullness or masses. Vulvovaginal atrophy w/ pallor, loss of rugae, introital retraction, excoriations.  Vulvar thinning, fusion of labia, clitoral hood retraction, prominent urethral meatus.   *** external genitalia, *** pubic hair distribution, no lesions.  Normal urethral meatus, no lesions, no prolapse, no discharge.   No urethral masses, tenderness and/or tenderness. No bladder fullness, tenderness or masses. ***  vagina mucosa, *** estrogen effect, no discharge, no lesions, *** pelvic support, *** cystocele and *** rectocele noted.  No cervical motion tenderness.  Uterus is freely mobile and non-fixed.  No adnexal/parametria masses or tenderness noted.  Anus and perineum are without rashes or lesions.   ***  Neurologic: Grossly intact, no focal deficits, moving all 4 extremities. Psychiatric: Normal mood and affect.     Laboratory Data: Urinalysis *** I have reviewed the labs.   Pertinent Imaging: N/A  Assessment & Plan:    Incontinence  -***  2. Nocturnal enuresis  - ***  3. History of hematuria  - Hematuria work up completed in 2016 & 2019 - NED - No report of gross hematuria  - UA ***   Royden Purl   [facility] [address] [place], [place] [address] [phone]

## 2022-04-08 NOTE — Assessment & Plan Note (Signed)
BiPAP nightly

## 2022-04-08 NOTE — Assessment & Plan Note (Addendum)
Chronic opioid therapy Continue oxycodone

## 2022-04-08 NOTE — H&P (Addendum)
History and Physical    Patient: Rachael Jordan NGE:[id number] DOB: [birth year] DOA: 04/08/2022 DOS: the patient was seen and examined on 04/08/2022 PCP: Sharyne Peach, MD  Patient coming from: Home  Chief Complaint:  Chief Complaint  Patient presents with   Fall    HPI: Rachael Jordan is a 67 y.o. female with medical history significant for Chronic pain on chronic narcotics, hypertension, asthma, who presents to the ED following a fall onto her left knee after tripping over a baby gate.  She denied preceding symptoms such as lightheadedness, palpitations, shortness of breath, one-sided weakness numbness or tingling or chest pain and was previously in her usual state of health..  Denies recent illness such as cough or congestion, nausea vomiting, abdominal pain, diarrhea or dysuria. ED course and data review: BP elevated at 142/119 and tachycardic to 116, tachypneic to 24-32 with O2 sat 100% on room air and afebrile.  Labs significant for WBC of 18,000, hemoglobin 11.4, potassium of 3.Trauma imaging including CT left knee and left hip or a proximal tibial fracture as outlined below: IMPRESSION: 1. Acute comminuted intra-articular proximal tibial fracture involves both the medial and lateral tibial plateaus with extension of lucency through the tibial spines. Lateral greater than medial depression of fracture fragments as above. Nondisplaced fracture lucency is visualized at the posterior to posterolateral cortex of the tibia to the level of the proximal shaft but the inferior extent is incompletely visualized. There is associated large lipohemarthrosis.  The ED provider: Spoke with on-call orthopedist, Dr. Mack Guise regarding admission.  Due to nature of fracture, Dr. Mack Guise discussed case with trauma specialist, Dr. Marcelino Scot at [facility] who would like patient monitored here at [facility] overnight for swelling with transfer to [facility] in the a.m. for possible elective surgery.  Please  refer to consult note for details.  Hospitalist is consulted for admission.   Review of Systems: As mentioned in the history of present illness. All other systems reviewed and are negative.  Past Medical History:  Diagnosis Date   Acid reflux    Anxiety    Arthritis    Asthma    Bronchitis 09/2015   CVA (cerebral vascular accident) ([place])    Gross hematuria    H. pylori infection    Heart murmur    Kidney failure    Labile essential hypertension 01/06/2018   Lung abnormality    "damaged lung due to pneumonia"   Neck pain    Nephrolithiasis    Pneumonia    Pneumothorax    Scoliosis    Seasonal allergies    Urinary frequency    Vaginal atrophy    Past Surgical History:  Procedure Laterality Date   ABDOMINAL HYSTERECTOMY     vaginal   APPENDECTOMY     BREAST BIOPSY     COLONOSCOPY N/A 01/02/2020   Procedure: COLONOSCOPY;  Surgeon: Toledo, Benay Pike, MD;  Location: [facility] ENDOSCOPY;  Service: Gastroenterology;  Laterality: N/A;   COLONOSCOPY WITH PROPOFOL N/A 12/29/2018   Procedure: COLONOSCOPY WITH PROPOFOL;  Surgeon: Lucilla Lame, MD;  Location: [facility] ENDOSCOPY;  Service: Endoscopy;  Laterality: N/A;   ESOPHAGOGASTRODUODENOSCOPY N/A 01/02/2020   Procedure: ESOPHAGOGASTRODUODENOSCOPY (EGD);  Surgeon: Toledo, Benay Pike, MD;  Location: [facility] ENDOSCOPY;  Service: Gastroenterology;  Laterality: N/A;   FOOT SURGERY Left    10/2015   NECK SURGERY     Social History:  reports that she has never smoked. She has never used smokeless tobacco. She reports that she does not drink alcohol and  does not use drugs.  Allergies  Allergen Reactions   Morphine And Related Itching   Oxymorphone Itching   Grapefruit Extract Rash   Oxycodone Itching   Propoxyphene Nausea And Vomiting and Nausea Only    Family History  Problem Relation Age of Onset   Arthritis Mother    Asthma Mother    Hyperlipidemia Mother    Hypertension Mother    Diabetes Sister    Cancer Maternal Aunt         esophagus   Breast cancer Neg Hx    Kidney cancer Neg Hx    Bladder Cancer Neg Hx     Prior to Admission medications   Medication Sig Start Date End Date Taking? Authorizing Provider  albuterol (PROVENTIL) (2.5 MG/3ML) 0.083% nebulizer solution Take 2.5 mg by nebulization every 6 (six) hours as needed for wheezing or shortness of breath.    [provider]  albuterol (VENTOLIN HFA) 108 (90 Base) MCG/ACT inhaler Inhale 2 puffs into the lungs every 6 (six) hours as needed for wheezing or shortness of breath. 12/07/20   McDonough, Si Gaul, PA-C  amLODipine (NORVASC) 5 MG tablet Take 5 mg by mouth daily. 04/28/20   [provider]  ammonium lactate (AMLACTIN) 12 % lotion Apply 1 application topically as needed for dry skin. 05/18/21   Trula Slade, DPM  aspirin EC 81 MG tablet Take 81 mg by mouth daily.    [provider]  atorvastatin (LIPITOR) 20 MG tablet Take 20 mg by mouth daily. 03/21/21   [provider]  azelastine (ASTELIN) 0.1 % nasal spray Place 1-2 sprays into both nostrils 2 (two) times daily.     [provider]  Biotin 10000 MCG TABS Take 10 mg by mouth daily.     [provider]  budesonide-formoterol (SYMBICORT) 80-4.5 MCG/ACT inhaler Inhale 2 puffs into the lungs 2 (two) times daily. 08/14/21   McDonough, Si Gaul, PA-C  cephALEXin (KEFLEX) 500 MG capsule Take 1 capsule (500 mg total) by mouth 3 (three) times daily. 01/02/22   Trula Slade, DPM  cetirizine (ZYRTEC) 10 MG tablet Take 10 mg by mouth at bedtime.     [provider]  Cholecalciferol 25 MCG (1000 UT) tablet Take 1,000 Units by mouth daily.     [provider]  ciclopirox (PENLAC) 8 % solution Apply topically at bedtime. Apply over nail and surrounding skin. Apply daily over previous coat. After seven (7) days, may remove with alcohol and continue cycle. 03/21/22   Trula Slade, DPM  Cyanocobalamin (B-12) 1000 MCG TABS Take by mouth daily.     [provider]  diclofenac Sodium (VOLTAREN) 1 % GEL Apply topically. 09/04/20   [provider]  gabapentin (NEURONTIN) 800 MG tablet Take 800 mg by mouth daily as needed. Causes hair loss 08/29/20   [provider]  HYDROcodone-acetaminophen (NORCO) 10-325 MG tablet Take 1 tablet by mouth every 4 (four) hours as needed. Patient not taking: Reported on 02/21/2022 03/30/21   [provider]  ipratropium (ATROVENT) 0.06 % nasal spray Place into the nose. 09/26/20   [provider]  lidocaine (LIDODERM) 5 % Place 1 patch onto the skin daily. Remove & Discard patch within 12 hours or as directed by MD 03/21/22   Trula Slade, DPM  mirabegron ER (MYRBETRIQ) 50 MG TB24 tablet Take 1 tablet (50 mg total) by mouth daily. 03/30/21   Zara Council A, PA-C  montelukast (SINGULAIR) 10 MG tablet Take  10 mg by mouth in the morning.    [provider]  Multiple Vitamins-Minerals (CENTRUM ADULTS PO) Take 1 tablet by mouth daily.     [provider]  naloxone [facility]) nasal spray 4 mg/0.1 mL SMARTSIG:Both Nares 09/04/20   [provider]  naloxone [facility]) nasal spray 4 mg/0.1 mL Take as prescribed for opoid overdose 01/02/22   Trula Slade, DPM  nortriptyline (PAMELOR) 10 MG capsule Take 10 mg by mouth 2 (two) times daily. 12/29/19   [provider]  oxyCODONE-acetaminophen (PERCOCET) 7.5-325 MG tablet Take 1 tablet by mouth every 4 (four) hours as needed. 01/02/22   Trula Slade, DPM  oxymorphone (OPANA) 10 MG tablet 10 mg every 8 (eight) hours as needed. 07/12/20   [provider]  pantoprazole (PROTONIX) 40 MG tablet Take 40 mg by mouth daily.    [provider]  promethazine (PHENERGAN) 25 MG tablet Take 1 tablet (25 mg total) by mouth every 8 (eight) hours as needed for nausea or vomiting. 01/02/22   Trula Slade, DPM  pyridOXINE (VITAMIN B-6) 100 MG tablet Take 100 mg by mouth daily.     [provider]  rizatriptan (MAXALT) 10 MG tablet Take 10 mg by mouth as needed for migraine. May repeat in 2 hours if needed    [provider]  tiZANidine (ZANAFLEX) 4 MG tablet Take by mouth. 05/25/20   [provider]  traZODone (DESYREL) 100 MG tablet Take 200 mg by mouth at bedtime.     [provider]  valACYclovir (VALTREX) 1000 MG tablet Take 1,000 mg by mouth daily as needed.    [provider]  vitamin C (ASCORBIC ACID) 500 MG tablet Take 500 mg by mouth daily.    [provider]  Vitamin E 45 MG CAPS Take 90 mg by mouth daily.    [provider]    Physical Exam: Vitals:   04/08/22 1834 04/08/22 1900 04/08/22 2015 04/08/22 2100  BP: (!) 156/88 (!) 155/76 (!) 145/85 (!) 167/87  Pulse: (!) 101 (!) 102 (!) 103 92  Resp: (!) 31 (!) 32 (!) 30 (!) 24  Temp:   98 F (36.7 C)   TempSrc:      SpO2: 99% 100% 100% 97%  Weight:      Height:       Physical Exam Vitals and nursing note reviewed.  Constitutional:      Comments: Patient grimacing in pain  HENT:     Head: Normocephalic and atraumatic.  Cardiovascular:     Rate and Rhythm: Normal rate and regular rhythm.     Heart sounds: Normal heart sounds.  Pulmonary:     Effort: Pulmonary effort is normal.     Breath sounds: Normal breath sounds.  Abdominal:     Palpations: Abdomen is soft.     Tenderness: There is no abdominal tenderness.  Neurological:     Mental Status: Mental status is at baseline.     Labs on Admission: I have personally reviewed following labs and imaging studies  CBC: Recent Labs  Lab 04/08/22 2010  WBC 18.3*  NEUTROABS 14.7*  HGB 11.4*  HCT 34.1*  MCV 89.0  PLT 371*   Basic Metabolic Panel: Recent Labs  Lab 04/08/22 2010  NA 137  K 3.0*  CL 105  CO2 20*  GLUCOSE 118*  BUN 10  CREATININE 0.73  CALCIUM 9.3   GFR: Estimated Creatinine Clearance: 70.1 mL/min (by C-G formula based on SCr  of 0.73 mg/dL). Liver Function  Tests: No results for input(s): "AST", "ALT", "ALKPHOS", "BILITOT", "PROT", "ALBUMIN" in the last 168 hours. No results for input(s): "LIPASE", "AMYLASE" in the last 168 hours. No results for input(s): "AMMONIA" in the last 168 hours. Coagulation Profile: No results for input(s): "INR", "PROTIME" in the last 168 hours. Cardiac Enzymes: No results for input(s): "CKTOTAL", "CKMB", "CKMBINDEX", "TROPONINI" in the last 168 hours. BNP (last 3 results) No results for input(s): "PROBNP" in the last 8760 hours. HbA1C: No results for input(s): "HGBA1C" in the last 72 hours. CBG: No results for input(s): "GLUCAP" in the last 168 hours. Lipid Profile: No results for input(s): "CHOL", "HDL", "LDLCALC", "TRIG", "CHOLHDL", "LDLDIRECT" in the last 72 hours. Thyroid Function Tests: No results for input(s): "TSH", "T4TOTAL", "FREET4", "T3FREE", "THYROIDAB" in the last 72 hours. Anemia Panel: No results for input(s): "VITAMINB12", "FOLATE", "FERRITIN", "TIBC", "IRON", "RETICCTPCT" in the last 72 hours. Urine analysis:    Component Value Date/Time   COLORURINE YELLOW (A) 12/30/2019 1217   APPEARANCEUR Clear 12/07/2020 0854   LABSPEC 1.023 12/30/2019 1217   LABSPEC 1.009 06/27/2014 1414   PHURINE 5.0 12/30/2019 1217   GLUCOSEU Negative 12/07/2020 0854   GLUCOSEU Negative 06/27/2014 1414   HGBUR NEGATIVE 12/30/2019 1217   BILIRUBINUR Negative 12/07/2020 0854   BILIRUBINUR Negative 06/27/2014 1414   KETONESUR 5 (A) 12/30/2019 1217   PROTEINUR Negative 12/07/2020 0854   PROTEINUR 30 (A) 12/30/2019 1217   NITRITE Negative 12/07/2020 0854   NITRITE NEGATIVE 12/30/2019 1217   LEUKOCYTESUR Negative 12/07/2020 0854   LEUKOCYTESUR NEGATIVE 12/30/2019 1217   LEUKOCYTESUR Negative 06/27/2014 1414    Radiological Exams on Admission: CT Hip Left Wo Contrast  Result Date: 04/08/2022 CLINICAL DATA:  Trauma, fall EXAM: CT OF THE LEFT HIP WITHOUT CONTRAST TECHNIQUE: Multidetector CT imaging of the left hip  was performed according to the standard protocol. Multiplanar CT image reconstructions were also generated. RADIATION DOSE REDUCTION: This exam was performed according to the departmental dose-optimization program which includes automated exposure control, adjustment of the mA and/or kV according to patient size and/or use of iterative reconstruction technique. COMPARISON:  None Available. FINDINGS: Bones/Joint/Cartilage No fracture or malalignment. Left SI joint is non widened. Pubic symphysis and rami appear intact. No significant hip effusion. The joint space appears patent. Ligaments Suboptimally assessed by CT. Muscles and Tendons No significant atrophy.  No intramuscular fluid collections Soft tissues Negative IMPRESSION: No CT evidence for acute osseous abnormality of the left hip. Electronically Signed   By: Donavan Foil M.D.   On: 04/08/2022 18:59   CT Knee Left Wo Contrast  Result Date: 04/08/2022 CLINICAL DATA:  Knee injury EXAM: CT OF THE LEFT KNEE WITHOUT CONTRAST TECHNIQUE: Multidetector CT imaging of the left knee was performed according to the standard protocol. Multiplanar CT image reconstructions were also generated. RADIATION DOSE REDUCTION: This exam was performed according to the departmental dose-optimization program which includes automated exposure control, adjustment of the mA and/or kV according to patient size and/or use of iterative reconstruction technique. COMPARISON:  Radiograph 04/08/2022. FINDINGS: Bones/Joint/Cartilage Acute comminuted intra-articular proximal tibial fracture for involves both the medial and lateral tibial plateaus with fracture lucency extending through the tibial spines. About 5 mm depression of the lateral tibial plateau fracture. About 2 mm depression of the medial tibial plateau. Nondisplaced cortical fracture lucency at the posterior to posterolateral cortex of tibia extends to at least the proximal shaft, incomplete visualization of distal extent. No  definitive fibular or patellar fracture. Large lipohemarthrosis.  Ligaments Suboptimally assessed by CT. Muscles and Tendons No significant atrophy. No intramuscular fluid collections. Patellar and quadriceps tendons appear grossly intact. Soft tissues Soft tissue swelling about the knee. Punctate focus of gas within the soft tissues lateral side of the knee at the level of patella, no obvious open wound in the vicinity. IMPRESSION: 1. Acute comminuted intra-articular proximal tibial fracture involves both the medial and lateral tibial plateaus with extension of lucency through the tibial spines. Lateral greater than medial depression of fracture fragments as above. Nondisplaced fracture lucency is visualized at the posterior to posterolateral cortex of the tibia to the level of the proximal shaft but the inferior extent is incompletely visualized. There is associated large lipohemarthrosis. Electronically Signed   By: Donavan Foil M.D.   On: 04/08/2022 18:57   DG Knee Left Port  Result Date: 04/08/2022 CLINICAL DATA:  Fall EXAM: PORTABLE LEFT KNEE - 1-2 VIEW COMPARISON:  None Available. FINDINGS: There is a comminuted fracture of the lateral tibial plateau. This extends into the tibial spines in his depressed proximally 1 cm. A nondisplaced oblique fracture fragment is seen through the proximal lateral diaphysis of the tibia. There is 6 mm of posterior offset of fracture fragments. There is no evidence for dislocation. Lipohemarthrosis is present. IMPRESSION: 1. Comminuted fracture of the lateral tibial plateau with extension into the tibial spines and proximal diaphysis. 2. Lipohemarthrosis. Electronically Signed   By: Ronney Asters M.D.   On: 04/08/2022 17:33     Data Reviewed: Relevant notes from primary care and specialist visits, past discharge summaries as available in EHR, including Care Everywhere. Prior diagnostic testing as pertinent to current admission diagnoses Updated medications and problem  lists for reconciliation ED course, including vitals, labs, imaging, treatment and response to treatment Triage notes, nursing and pharmacy notes and ED provider's notes Notable results as noted in HPI   Assessment and Plan: Closed fracture of left tibial plateau, initial encounter Accidental fall Patient in knee immobilizer Pain control Management per orthopedics Plan to transfer to trauma surgery at [facility], Dr. Marcelino Scot in the a.m. per Dr. Mack Guise N.p.o. from midnight SCDs for DVT prophylaxis Discussed with Dr. Mack Guise  Back pain, chronic Chronic opioid therapy Continue oxycodone  Asthma Continue home maintenance inhaler Albuterol as needed  OSA on CPAP BiPAP nightly  Depression with anxiety Continue home trazodone and nortriptyline pending med rec  Essential hypertension Continue amlodipine        DVT prophylaxis: SCD  Consults: orthopedics, Dr Mack Guise  Advance Care Planning:   Code Status: Full Code   Family Communication: Husband at bedside  Disposition Plan: Back to previous home environment  Severity of Illness: The appropriate patient status for this patient is INPATIENT. Inpatient status is judged to be reasonable and necessary in order to provide the required intensity of service to ensure the patient's safety. The patient's presenting symptoms, physical exam findings, and initial radiographic and laboratory data in the context of their chronic comorbidities is felt to place them at high risk for further clinical deterioration. Furthermore, it is not anticipated that the patient will be medically stable for discharge from the hospital within 2 midnights of admission.   * I certify that at the point of admission it is my clinical judgment that the patient will require inpatient hospital care spanning beyond 2 midnights from the point of admission due to high intensity of service, high risk for further deterioration and high frequency of  surveillance required.*  Author: Athena Masse, MD  04/08/2022 10:46 PM  For on call review [email or web address].

## 2022-04-08 NOTE — Assessment & Plan Note (Signed)
- 

## 2022-04-08 NOTE — ED Triage Notes (Signed)
Pt BIBA from home. Pt fell, tripping over a baby gate. Pt c/o left leg pain- heard a "pop" when she fell. ?deformity to left knee?  Pt has received 100 mcg fent without relief, as well as 4 zofran  114 CBG 175/94 112 HR 99% RA  22g L hand

## 2022-04-08 NOTE — Assessment & Plan Note (Addendum)
Patient in knee immobilizer and pain control using home oxycodone, IV Toradol, IV Dilaudid.  Ice packs.  SCDs for DVT prophylaxis.  Has been n.p.o. since midnight.  Transferring to trauma surgery at [facility] this morning.  Dr. Marcelino Scot is the orthopedic surgeon there.

## 2022-04-08 NOTE — ED Provider Notes (Signed)
[facility] Provider Note    Event Date/Time   First MD Initiated Contact with Patient 04/08/22 1611     (approximate)   History   Fall   HPI  Rachael Jordan is a 67 y.o. female with history of COPD, migraine, anemia, CKD, chronic pain syndrome and as listed in EMR presents to the emergency department for treatment and evaluation of the left knee pain.  She was babysitting her grandson and was trying to step over a baby gate.  She got her foot caught and landed awkwardly.  She is having severe left knee pain.  EMS gave 100 of fentanyl and 4 of Zofran without relief.      Physical Exam   Triage Vital Signs: ED Triage Vitals  Enc Vitals Group     BP 04/08/22 1612 (!) 142/119     Pulse Rate 04/08/22 1612 (!) 116     Resp 04/08/22 1612 16     Temp 04/08/22 1612 98.6 F (37 C)     Temp Source 04/08/22 1612 Oral     SpO2 04/08/22 1612 100 %     Weight 04/08/22 1611 156 lb (70.8 kg)     Height 04/08/22 1611 '5\' 5"'$  (1.651 m)     Head Circumference --      Peak Flow --      Pain Score 04/08/22 1611 10     Pain Loc --      Pain Edu? --      Excl. in [place]? --     Most recent vital signs: Vitals:   04/08/22 1834 04/08/22 1900  BP: (!) 156/88 (!) 155/76  Pulse: (!) 101 (!) 102  Resp: (!) 31 (!) 32  Temp:    SpO2: 99% 100%    General: Awake, no distress.  CV:  Good peripheral perfusion.  Resp:  Normal effort.  Abd:  No distention.  Other:  Diffuse left knee pain. Unable to attempt ROM due to pain.   ED Results / Procedures / Treatments   Labs (all labs ordered are listed, but only abnormal results are displayed) Labs Reviewed  CBC WITH DIFFERENTIAL/PLATELET  BASIC METABOLIC PANEL     EKG  Not indicated.   RADIOLOGY  Image and radiology report reviewed and interpreted by me. Radiology report consistent with the same.  Image of the left knee shows a comminuted fracture of the lateral tibial plateau that extends into the tibial  spines and proximal diaphysis.  Lipohemarthrosis also noted.  CT of the left knee shows an acute comminuted intra-articular proximal tibial fracture involving the medial and lateral tibial plateaus with extension of lucency through the tibial spines.  Lateral greater than medial depression of fragments.  Nondisplaced fracture lucency is visualized also at the posterior to posterior lateral cortex of the tibia to the level of the proximal shaft.  Associated large lipohemarthrosis.  PROCEDURES:  Critical Care performed: No  Procedures   MEDICATIONS ORDERED IN ED:  Medications  HYDROmorphone (DILAUDID) injection 1 mg (has no administration in time range)  HYDROmorphone (DILAUDID) injection 0.5 mg (0.5 mg Intravenous Given 04/08/22 1644)  HYDROmorphone (DILAUDID) injection 0.5 mg (0.5 mg Intravenous Given 04/08/22 1710)  fentaNYL (SUBLIMAZE) injection 100 mcg (100 mcg Intravenous Given 04/08/22 1757)  diazepam (VALIUM) tablet 5 mg (5 mg Oral Given 04/08/22 1856)     IMPRESSION / MDM / ASSESSMENT AND PLAN / ED COURSE   I have reviewed the triage note.  Differential diagnosis includes, but is not  limited to, patella fracture, patella dislocation, ligament tear, knee strain, tibia/fibula fracture, distal femur fracture, soft tissue injury.  Patient's presentation is most consistent with acute illness / injury with system symptoms.  67 year old female presenting to the emergency department for treatment and evaluation after mechanical, nonsyncopal fall at home prior to arrival.  See HPI for further details.  Exam is difficult as the patient is in severe pain and does not tolerate any exam movements.  Plan will be to get imaging and give pain medication and reattempt assessment.  Patient unable to tolerate complete knee imaging.  2 view shows a comminuted fracture of the lateral tibial plateau that extends into the tibial spines and proximal diaphysis. Plan will be to get a CT for further  details. Patient aware of the imaging results and agreeable to the plan. Pain is not controlled even after a second dose of Dilaudid. She will get Fentanyl just before going to CT.  Discussed with Dr. Mack Guise with orthopedics who will review the images and call back.  According to Dr. Mack Guise, patient will require transfer to trauma service at [facility]. He is arranging transfer for tomorrow, but will come in tonight to see her. She will require observation/admission until transfer arrangements made.   Patient reassessed. Pain is not well controlled. Compartments are soft. Another dose of Dilaudid ordered. Family at bedside. I discussed the plan for orthopedics and transfer tomorrow.       FINAL CLINICAL IMPRESSION(S) / ED DIAGNOSES   Final diagnoses:  Closed fracture of medial portion of left tibial plateau, initial encounter  Closed fracture of lateral portion of left tibial plateau, initial encounter  Lipohemarthrosis     Rx / DC Orders   ED Discharge Orders     None        Note:  This document was prepared using Dragon voice recognition software and may include unintentional dictation errors.   Victorino Dike, FNP 04/08/22 Lona Kettle    Lavonia Drafts, MD 04/08/22 2130

## 2022-04-09 ENCOUNTER — Other Ambulatory Visit: Payer: Self-pay

## 2022-04-09 ENCOUNTER — Encounter (HOSPITAL_COMMUNITY): Payer: Self-pay

## 2022-04-09 ENCOUNTER — Inpatient Hospital Stay (HOSPITAL_COMMUNITY)
Admission: AD | Admit: 2022-04-09 | Discharge: 2022-04-15 | DRG: 493 | Disposition: A | Discharge status: Home Health | Payer: 59 | Attending: Internal Medicine | Admitting: Internal Medicine

## 2022-04-09 ENCOUNTER — Ambulatory Visit: Payer: 59 | Admitting: Urology

## 2022-04-09 DIAGNOSIS — Z888 Allergy status to other drugs, medicaments and biological substances status: Secondary | ICD-10-CM

## 2022-04-09 DIAGNOSIS — I10 Essential (primary) hypertension: Secondary | ICD-10-CM | POA: Diagnosis not present

## 2022-04-09 DIAGNOSIS — D62 Acute posthemorrhagic anemia: Secondary | ICD-10-CM | POA: Diagnosis not present

## 2022-04-09 DIAGNOSIS — E871 Hypo-osmolality and hyponatremia: Secondary | ICD-10-CM | POA: Diagnosis not present

## 2022-04-09 DIAGNOSIS — Z825 Family history of asthma and other chronic lower respiratory diseases: Secondary | ICD-10-CM

## 2022-04-09 DIAGNOSIS — G4733 Obstructive sleep apnea (adult) (pediatric): Secondary | ICD-10-CM

## 2022-04-09 DIAGNOSIS — Z809 Family history of malignant neoplasm, unspecified: Secondary | ICD-10-CM

## 2022-04-09 DIAGNOSIS — K219 Gastro-esophageal reflux disease without esophagitis: Secondary | ICD-10-CM | POA: Diagnosis present

## 2022-04-09 DIAGNOSIS — Z8249 Family history of ischemic heart disease and other diseases of the circulatory system: Secondary | ICD-10-CM | POA: Diagnosis not present

## 2022-04-09 DIAGNOSIS — S82142A Displaced bicondylar fracture of left tibia, initial encounter for closed fracture: Principal | ICD-10-CM | POA: Diagnosis present

## 2022-04-09 DIAGNOSIS — R32 Unspecified urinary incontinence: Secondary | ICD-10-CM | POA: Diagnosis present

## 2022-04-09 DIAGNOSIS — E876 Hypokalemia: Secondary | ICD-10-CM | POA: Diagnosis present

## 2022-04-09 DIAGNOSIS — Z885 Allergy status to narcotic agent status: Secondary | ICD-10-CM | POA: Diagnosis not present

## 2022-04-09 DIAGNOSIS — M797 Fibromyalgia: Secondary | ICD-10-CM | POA: Diagnosis present

## 2022-04-09 DIAGNOSIS — J45909 Unspecified asthma, uncomplicated: Secondary | ICD-10-CM | POA: Diagnosis not present

## 2022-04-09 DIAGNOSIS — E663 Overweight: Secondary | ICD-10-CM

## 2022-04-09 DIAGNOSIS — M419 Scoliosis, unspecified: Secondary | ICD-10-CM | POA: Diagnosis present

## 2022-04-09 DIAGNOSIS — Z83438 Family history of other disorder of lipoprotein metabolism and other lipidemia: Secondary | ICD-10-CM | POA: Diagnosis not present

## 2022-04-09 DIAGNOSIS — Z91018 Allergy to other foods: Secondary | ICD-10-CM

## 2022-04-09 DIAGNOSIS — Z8673 Personal history of transient ischemic attack (TIA), and cerebral infarction without residual deficits: Secondary | ICD-10-CM

## 2022-04-09 DIAGNOSIS — Z79891 Long term (current) use of opiate analgesic: Secondary | ICD-10-CM

## 2022-04-09 DIAGNOSIS — Z90710 Acquired absence of both cervix and uterus: Secondary | ICD-10-CM

## 2022-04-09 DIAGNOSIS — W010XXA Fall on same level from slipping, tripping and stumbling without subsequent striking against object, initial encounter: Secondary | ICD-10-CM | POA: Diagnosis not present

## 2022-04-09 DIAGNOSIS — F419 Anxiety disorder, unspecified: Secondary | ICD-10-CM | POA: Diagnosis present

## 2022-04-09 DIAGNOSIS — W19XXXA Unspecified fall, initial encounter: Secondary | ICD-10-CM | POA: Diagnosis not present

## 2022-04-09 DIAGNOSIS — Z8261 Family history of arthritis: Secondary | ICD-10-CM

## 2022-04-09 DIAGNOSIS — E785 Hyperlipidemia, unspecified: Secondary | ICD-10-CM | POA: Diagnosis present

## 2022-04-09 DIAGNOSIS — Z79899 Other long term (current) drug therapy: Secondary | ICD-10-CM | POA: Diagnosis not present

## 2022-04-09 DIAGNOSIS — Z833 Family history of diabetes mellitus: Secondary | ICD-10-CM | POA: Diagnosis not present

## 2022-04-09 DIAGNOSIS — Z87442 Personal history of urinary calculi: Secondary | ICD-10-CM

## 2022-04-09 DIAGNOSIS — J4489 Other specified chronic obstructive pulmonary disease: Secondary | ICD-10-CM | POA: Diagnosis present

## 2022-04-09 DIAGNOSIS — D75839 Thrombocytosis, unspecified: Secondary | ICD-10-CM | POA: Diagnosis not present

## 2022-04-09 DIAGNOSIS — S82142D Displaced bicondylar fracture of left tibia, subsequent encounter for closed fracture with routine healing: Secondary | ICD-10-CM | POA: Diagnosis not present

## 2022-04-09 DIAGNOSIS — G894 Chronic pain syndrome: Secondary | ICD-10-CM | POA: Diagnosis not present

## 2022-04-09 DIAGNOSIS — Z7982 Long term (current) use of aspirin: Secondary | ICD-10-CM | POA: Diagnosis not present

## 2022-04-09 DIAGNOSIS — D72829 Elevated white blood cell count, unspecified: Secondary | ICD-10-CM | POA: Diagnosis not present

## 2022-04-09 DIAGNOSIS — S82101A Unspecified fracture of upper end of right tibia, initial encounter for closed fracture: Secondary | ICD-10-CM | POA: Diagnosis not present

## 2022-04-09 DIAGNOSIS — S82102A Unspecified fracture of upper end of left tibia, initial encounter for closed fracture: Secondary | ICD-10-CM | POA: Diagnosis not present

## 2022-04-09 LAB — HIV ANTIBODY (ROUTINE TESTING W REFLEX): HIV Screen 4th Generation wRfx: NONREACTIVE

## 2022-04-09 MED ORDER — MIRABEGRON ER 50 MG PO TB24
50.0000 mg | ORAL_TABLET | Freq: Every day | ORAL | Status: DC
  Administered 2022-04-11 – 2022-04-15 (×5): 50 mg via ORAL
  Filled 2022-04-09 (×6): qty 1

## 2022-04-09 MED ORDER — NORTRIPTYLINE HCL 10 MG PO CAPS
10.0000 mg | ORAL_CAPSULE | Freq: Two times a day (BID) | ORAL | Status: DC
  Administered 2022-04-09 – 2022-04-15 (×11): 10 mg via ORAL
  Filled 2022-04-09 (×13): qty 1

## 2022-04-09 MED ORDER — CHLORHEXIDINE GLUCONATE 0.12 % MT SOLN
15.0000 mL | OROMUCOSAL | Status: DC
  Filled 2022-04-09: qty 15

## 2022-04-09 MED ORDER — ASPIRIN 81 MG PO TBEC
81.0000 mg | DELAYED_RELEASE_TABLET | Freq: Every day | ORAL | Status: DC
  Administered 2022-04-10 – 2022-04-15 (×6): 81 mg via ORAL
  Filled 2022-04-09 (×6): qty 1

## 2022-04-09 MED ORDER — ONDANSETRON HCL 4 MG/2ML IJ SOLN
4.0000 mg | Freq: Four times a day (QID) | INTRAMUSCULAR | Status: DC | PRN
  Administered 2022-04-09 – 2022-04-13 (×7): 4 mg via INTRAVENOUS
  Filled 2022-04-09 (×8): qty 2

## 2022-04-09 MED ORDER — OXYCODONE HCL 5 MG PO TABS
7.5000 mg | ORAL_TABLET | ORAL | Status: DC | PRN
  Administered 2022-04-09 – 2022-04-11 (×5): 7.5 mg via ORAL
  Filled 2022-04-09 (×5): qty 2

## 2022-04-09 MED ORDER — HYDROCODONE-ACETAMINOPHEN 10-325 MG PO TABS
0.5000 | ORAL_TABLET | ORAL | Status: DC | PRN
  Administered 2022-04-09: 2 via ORAL
  Filled 2022-04-09: qty 2

## 2022-04-09 MED ORDER — HYDROMORPHONE HCL 1 MG/ML IJ SOLN
0.5000 mg | INTRAMUSCULAR | Status: DC | PRN
  Administered 2022-04-09 (×2): 0.5 mg via INTRAVENOUS
  Filled 2022-04-09 (×2): qty 0.5

## 2022-04-09 MED ORDER — ATORVASTATIN CALCIUM 10 MG PO TABS
20.0000 mg | ORAL_TABLET | Freq: Every day | ORAL | Status: DC
  Administered 2022-04-10 – 2022-04-15 (×6): 20 mg via ORAL
  Filled 2022-04-09 (×6): qty 2

## 2022-04-09 MED ORDER — TIZANIDINE HCL 4 MG PO TABS
4.0000 mg | ORAL_TABLET | Freq: Once | ORAL | Status: AC
  Administered 2022-04-09: 4 mg via ORAL
  Filled 2022-04-09: qty 1

## 2022-04-09 MED ORDER — KETOROLAC TROMETHAMINE 30 MG/ML IJ SOLN
30.0000 mg | Freq: Four times a day (QID) | INTRAMUSCULAR | Status: AC | PRN
  Administered 2022-04-09 – 2022-04-11 (×6): 30 mg via INTRAVENOUS
  Filled 2022-04-09 (×7): qty 1

## 2022-04-09 MED ORDER — HYDROMORPHONE HCL 1 MG/ML IJ SOLN
0.5000 mg | INTRAMUSCULAR | Status: DC | PRN
  Administered 2022-04-09 – 2022-04-12 (×18): 1 mg via INTRAVENOUS
  Administered 2022-04-12: 0.5 mg via INTRAVENOUS
  Administered 2022-04-12 – 2022-04-14 (×8): 1 mg via INTRAVENOUS
  Filled 2022-04-09 (×29): qty 1

## 2022-04-09 MED ORDER — TRAZODONE HCL 50 MG PO TABS
100.0000 mg | ORAL_TABLET | Freq: Every day | ORAL | Status: DC
  Administered 2022-04-09 – 2022-04-14 (×6): 100 mg via ORAL
  Filled 2022-04-09 (×6): qty 2

## 2022-04-09 MED ORDER — METHOCARBAMOL 1000 MG/10ML IJ SOLN: 500.0000 mg | Freq: Four times a day (QID) | INTRAVENOUS | Status: DC | PRN

## 2022-04-09 MED ORDER — BISACODYL 5 MG PO TBEC: 5.0000 mg | DELAYED_RELEASE_TABLET | Freq: Every day | ORAL | Status: DC | PRN

## 2022-04-09 MED ORDER — HYDROMORPHONE HCL 1 MG/ML IJ SOLN
1.0000 mg | Freq: Once | INTRAMUSCULAR | Status: AC
  Administered 2022-04-09: 1 mg via INTRAVENOUS
  Filled 2022-04-09: qty 1

## 2022-04-09 MED ORDER — KETOROLAC TROMETHAMINE 30 MG/ML IJ SOLN
30.0000 mg | Freq: Once | INTRAMUSCULAR | Status: AC
  Administered 2022-04-09: 30 mg via INTRAVENOUS
  Filled 2022-04-09: qty 1

## 2022-04-09 MED ORDER — POLYETHYLENE GLYCOL 3350 17 G PO PACK
17.0000 g | PACK | Freq: Every day | ORAL | Status: DC | PRN
  Administered 2022-04-12: 17 g via ORAL
  Filled 2022-04-09: qty 1

## 2022-04-09 MED ORDER — PANTOPRAZOLE SODIUM 40 MG PO TBEC
40.0000 mg | DELAYED_RELEASE_TABLET | Freq: Every day | ORAL | Status: DC
  Administered 2022-04-10 – 2022-04-15 (×6): 40 mg via ORAL
  Filled 2022-04-09 (×7): qty 1

## 2022-04-09 MED ORDER — METHOCARBAMOL 500 MG PO TABS
500.0000 mg | ORAL_TABLET | Freq: Four times a day (QID) | ORAL | Status: DC | PRN
  Administered 2022-04-09 – 2022-04-10 (×4): 500 mg via ORAL
  Filled 2022-04-09 (×4): qty 1

## 2022-04-09 MED ORDER — DOCUSATE SODIUM 100 MG PO CAPS
100.0000 mg | ORAL_CAPSULE | Freq: Two times a day (BID) | ORAL | Status: DC
  Administered 2022-04-09: 100 mg via ORAL
  Filled 2022-04-09: qty 1

## 2022-04-09 MED ORDER — LORATADINE 10 MG PO TABS
10.0000 mg | ORAL_TABLET | Freq: Every day | ORAL | Status: DC
  Administered 2022-04-10 – 2022-04-15 (×6): 10 mg via ORAL
  Filled 2022-04-09 (×6): qty 1

## 2022-04-09 MED ORDER — LACTATED RINGERS IV SOLN: INTRAVENOUS | Status: DC

## 2022-04-09 MED ORDER — TRAZODONE HCL 100 MG PO TABS: 100.0000 mg | ORAL_TABLET | Freq: Every day | ORAL | Status: AC

## 2022-04-09 MED ORDER — ALBUTEROL SULFATE (2.5 MG/3ML) 0.083% IN NEBU: 2.5000 mg | INHALATION_SOLUTION | Freq: Four times a day (QID) | RESPIRATORY_TRACT | Status: DC | PRN

## 2022-04-09 MED ORDER — HYDROMORPHONE HCL 1 MG/ML IJ SOLN
2.0000 mg | INTRAMUSCULAR | Status: DC | PRN
  Administered 2022-04-09 (×2): 2 mg via INTRAVENOUS
  Filled 2022-04-09 (×2): qty 2

## 2022-04-09 MED ORDER — DIPHENHYDRAMINE HCL 25 MG PO CAPS
25.0000 mg | ORAL_CAPSULE | Freq: Four times a day (QID) | ORAL | Status: DC | PRN
  Administered 2022-04-09: 25 mg via ORAL
  Filled 2022-04-09: qty 1

## 2022-04-09 MED ORDER — AMLODIPINE BESYLATE 5 MG PO TABS
5.0000 mg | ORAL_TABLET | Freq: Every day | ORAL | Status: DC
  Administered 2022-04-10 – 2022-04-15 (×6): 5 mg via ORAL
  Filled 2022-04-09 (×6): qty 1

## 2022-04-09 MED ORDER — AZELASTINE HCL 0.1 % NA SOLN
1.0000 | Freq: Two times a day (BID) | NASAL | Status: DC
  Administered 2022-04-09 – 2022-04-10 (×2): 2 via NASAL
  Administered 2022-04-11: 1 via NASAL
  Administered 2022-04-11 – 2022-04-12 (×2): 2 via NASAL
  Administered 2022-04-12: 1 via NASAL
  Administered 2022-04-14: 2 via NASAL
  Administered 2022-04-14: 1 via NASAL
  Filled 2022-04-09: qty 30

## 2022-04-09 NOTE — H&P (Signed)
History and Physical    Patient: Rachael Jordan ZOX:[id number] DOB: [birth year] DOA: 04/09/2022 DOS: the patient was seen and examined on 04/09/2022 PCP: Sharyne Peach, MD  Patient coming from: Home - lives with sister and niece; NOK: Jefferson [employer], [phone]   Chief Complaint: Fall  HPI: Rachael Jordan is a 67 y.o. female with medical history significant of chronic pain and HTN presenting with a mechanical fall.  She was admitted from 1/22-23 at [facility] and was transferred to [facility] in anticipation of trauma surgery due to the nature of the injury.  She reports that she was carrying her infant grandson and tripped over a baby gate.  She was able to protect the baby in the fall, but at the expense of her L leg.  She is having pain from her hip to foot.  She also previously had neuroma surgery on that same foot.  No other injuries in the fall.  She does have chronic pain from fibromyalgia and takes chronic narcotics.  No cardiac history or CHF history, no recent CP.    Hospital Course:  [facility] to [facility] transfer, per Dr. [place] Pink:  Left closed comminuted bicondylar tibial plateau fracture. [facility] talked it over with Ortho at [facility] and Dr. Marcelino Scot will do the surgery at [facility] and plan for surgery tomorrow.      Review of Systems: As mentioned in the history of present illness. All other systems reviewed and are negative. Past Medical History:  Diagnosis Date   Acid reflux    Anxiety    Arthritis    Asthma    Bronchitis 09/2015   CVA (cerebral vascular accident) ([place])    Gross hematuria    H. pylori infection    Heart murmur    Kidney failure    Labile essential hypertension 01/06/2018   Lung abnormality    "damaged lung due to pneumonia"   Neck pain    Nephrolithiasis    Pneumonia    Pneumothorax    Scoliosis    Seasonal allergies    Urinary frequency    Vaginal atrophy    Past Surgical History:  Procedure Laterality Date   ABDOMINAL HYSTERECTOMY     vaginal    APPENDECTOMY     BREAST BIOPSY     COLONOSCOPY N/A 01/02/2020   Procedure: COLONOSCOPY;  Surgeon: Toledo, Benay Pike, MD;  Location: [facility] ENDOSCOPY;  Service: Gastroenterology;  Laterality: N/A;   COLONOSCOPY WITH PROPOFOL N/A 12/29/2018   Procedure: COLONOSCOPY WITH PROPOFOL;  Surgeon: Lucilla Lame, MD;  Location: [facility] ENDOSCOPY;  Service: Endoscopy;  Laterality: N/A;   ESOPHAGOGASTRODUODENOSCOPY N/A 01/02/2020   Procedure: ESOPHAGOGASTRODUODENOSCOPY (EGD);  Surgeon: Toledo, Benay Pike, MD;  Location: [facility] ENDOSCOPY;  Service: Gastroenterology;  Laterality: N/A;   FOOT SURGERY Left    10/2015   NECK SURGERY     Social History:  reports that she has never smoked. She has never used smokeless tobacco. She reports that she does not drink alcohol and does not use drugs.  Allergies  Allergen Reactions   Morphine And Related Itching   Oxymorphone Itching   Grapefruit Extract Rash   Oxycodone Itching   Propoxyphene Nausea And Vomiting and Nausea Only    Family History  Problem Relation Age of Onset   Arthritis Mother    Asthma Mother    Hyperlipidemia Mother    Hypertension Mother    Diabetes Sister    Cancer Maternal Aunt        esophagus   Breast cancer  Neg Hx    Kidney cancer Neg Hx    Bladder Cancer Neg Hx     Prior to Admission medications   Medication Sig Start Date End Date Taking? Authorizing Provider  albuterol (PROVENTIL) (2.5 MG/3ML) 0.083% nebulizer solution Take 2.5 mg by nebulization every 6 (six) hours as needed for wheezing or shortness of breath.    [provider]  albuterol (VENTOLIN HFA) 108 (90 Base) MCG/ACT inhaler Inhale 2 puffs into the lungs every 6 (six) hours as needed for wheezing or shortness of breath. 12/07/20   McDonough, Si Gaul, PA-C  amLODipine (NORVASC) 5 MG tablet Take 5 mg by mouth daily. 04/28/20   [provider]  ammonium lactate (AMLACTIN) 12 % lotion Apply 1 application topically as needed for dry skin. 05/18/21   Trula Slade, DPM  aspirin EC 81 MG tablet Take 81 mg by mouth daily.    [provider]  atorvastatin (LIPITOR) 20 MG tablet Take 20 mg by mouth daily. 03/21/21   [provider]  azelastine (ASTELIN) 0.1 % nasal spray Place 1-2 sprays into both nostrils 2 (two) times daily.     [provider]  Biotin 10000 MCG TABS Take 10 mg by mouth daily.     [provider]  cetirizine (ZYRTEC) 10 MG tablet Take 10 mg by mouth at bedtime.     [provider]  Cholecalciferol 25 MCG (1000 UT) tablet Take 1,000 Units by mouth daily.     [provider]  ciclopirox (PENLAC) 8 % solution Apply topically at bedtime. Apply over nail and surrounding skin. Apply daily over previous coat. After seven (7) days, may remove with alcohol and continue cycle. 03/21/22   Trula Slade, DPM  Cyanocobalamin (B-12) 1000 MCG TABS Take by mouth daily.    [provider]  diclofenac Sodium (VOLTAREN) 1 % GEL Apply topically. 09/04/20   [provider]  gabapentin (NEURONTIN) 800 MG tablet Take 800 mg by mouth daily as needed. Causes hair loss 08/29/20   [provider]  ipratropium (ATROVENT) 0.06 % nasal spray Place into the nose. 09/26/20   [provider]  lidocaine (LIDODERM) 5 % Place 1 patch onto the skin daily. Remove & Discard patch within 12 hours or as directed by MD 03/21/22   Trula Slade, DPM  mirabegron ER (MYRBETRIQ) 50 MG TB24 tablet Take 1 tablet (50 mg total) by mouth daily. 03/30/21   Zara Council A, PA-C  montelukast (SINGULAIR) 10 MG tablet Take 10 mg by mouth in the morning.    [provider]  Multiple Vitamins-Minerals (CENTRUM ADULTS PO) Take 1 tablet by mouth daily.     [provider]  naloxone [facility]) nasal spray 4 mg/0.1 mL SMARTSIG:Both Nares 09/04/20   [provider]  naloxone [facility]) nasal spray 4 mg/0.1 mL Take as prescribed for opoid overdose 01/02/22   Trula Slade, DPM   nortriptyline (PAMELOR) 10 MG capsule Take 10 mg by mouth 2 (two) times daily. 12/29/19   [provider]  oxyCODONE-acetaminophen (PERCOCET) 7.5-325 MG tablet Take 1 tablet by mouth every 4 (four) hours as needed. 01/02/22   Trula Slade, DPM  pantoprazole (PROTONIX) 40 MG tablet Take 40 mg by mouth daily.    [provider]  promethazine (PHENERGAN) 25 MG tablet Take 1 tablet (25 mg total) by mouth every 8 (eight) hours as needed for nausea or vomiting. 01/02/22   Trula Slade, DPM  pyridOXINE (VITAMIN B-6)  100 MG tablet Take 100 mg by mouth daily.    [provider]  rizatriptan (MAXALT) 10 MG tablet Take 10 mg by mouth as needed for migraine. May repeat in 2 hours if needed    [provider]  tiZANidine (ZANAFLEX) 4 MG tablet Take by mouth. 05/25/20   [provider]  traZODone (DESYREL) 100 MG tablet Take 1 tablet (100 mg total) by mouth at bedtime. 04/09/22   Annita Brod, MD  valACYclovir (VALTREX) 1000 MG tablet Take 1,000 mg by mouth daily as needed.    [provider]  vitamin C (ASCORBIC ACID) 500 MG tablet Take 500 mg by mouth daily.    [provider]  Vitamin E 45 MG CAPS Take 90 mg by mouth daily.    [provider]    Physical Exam: Vitals:   04/09/22 1226 04/09/22 1441  BP: 120/73 138/83  Pulse: 94 94  Resp: 18 18  Temp:  98.8 F (37.1 C)  TempSrc:  Oral  SpO2: 95% 99%   General:  Appears calm and comfortable and is in NAD; reported LLE pain despite knee immobilizer Eyes:  EOMI, normal lids, iris ENT: grossly normal lips & tongue, mmm; artificial dentition Neck:  no LAD, masses or thyromegaly Cardiovascular:  RRR, no m/r/g. No LE edema.  Respiratory:   CTA bilaterally with no wheezes/rales/rhonchi.  Normal respiratory effort.   Abdomen:  soft, NT, ND Skin:  no rash or induration seen on limited exam Musculoskeletal:  RL knee is in immobilizer Lower extremity:  No LE edema.   Limited foot exam with no ulcerations.  2+ distal pulses. Psychiatric:  grossly normal mood and affect, speech fluent and appropriate, A&O x 3 Neurologic:  CN 2-12 grossly intact, moves all extremities in coordinated fashion other than LLE    Radiological Exams on Admission: Independently reviewed - see discussion in A/P where applicable  CT Hip Left Wo Contrast  Result Date: 04/08/2022 CLINICAL DATA:  Trauma, fall EXAM: CT OF THE LEFT HIP WITHOUT CONTRAST TECHNIQUE: Multidetector CT imaging of the left hip was performed according to the standard protocol. Multiplanar CT image reconstructions were also generated. RADIATION DOSE REDUCTION: This exam was performed according to the departmental dose-optimization program which includes automated exposure control, adjustment of the mA and/or kV according to patient size and/or use of iterative reconstruction technique. COMPARISON:  None Available. FINDINGS: Bones/Joint/Cartilage No fracture or malalignment. Left SI joint is non widened. Pubic symphysis and rami appear intact. No significant hip effusion. The joint space appears patent. Ligaments Suboptimally assessed by CT. Muscles and Tendons No significant atrophy.  No intramuscular fluid collections Soft tissues Negative IMPRESSION: No CT evidence for acute osseous abnormality of the left hip. Electronically Signed   By: Donavan Foil M.D.   On: 04/08/2022 18:59   CT Knee Left Wo Contrast  Result Date: 04/08/2022 CLINICAL DATA:  Knee injury EXAM: CT OF THE LEFT KNEE WITHOUT CONTRAST TECHNIQUE: Multidetector CT imaging of the left knee was performed according to the standard protocol. Multiplanar CT image reconstructions were also generated. RADIATION DOSE REDUCTION: This exam was performed according to the departmental dose-optimization program which includes automated exposure control, adjustment of the mA and/or kV according to patient size and/or use of iterative reconstruction technique. COMPARISON:   Radiograph 04/08/2022. FINDINGS: Bones/Joint/Cartilage Acute comminuted intra-articular proximal tibial fracture for involves both the medial and lateral tibial plateaus with fracture lucency extending through the tibial spines. About 5 mm depression of the lateral tibial plateau  fracture. About 2 mm depression of the medial tibial plateau. Nondisplaced cortical fracture lucency at the posterior to posterolateral cortex of tibia extends to at least the proximal shaft, incomplete visualization of distal extent. No definitive fibular or patellar fracture. Large lipohemarthrosis. Ligaments Suboptimally assessed by CT. Muscles and Tendons No significant atrophy. No intramuscular fluid collections. Patellar and quadriceps tendons appear grossly intact. Soft tissues Soft tissue swelling about the knee. Punctate focus of gas within the soft tissues lateral side of the knee at the level of patella, no obvious open wound in the vicinity. IMPRESSION: 1. Acute comminuted intra-articular proximal tibial fracture involves both the medial and lateral tibial plateaus with extension of lucency through the tibial spines. Lateral greater than medial depression of fracture fragments as above. Nondisplaced fracture lucency is visualized at the posterior to posterolateral cortex of the tibia to the level of the proximal shaft but the inferior extent is incompletely visualized. There is associated large lipohemarthrosis. Electronically Signed   By: Donavan Foil M.D.   On: 04/08/2022 18:57   DG Knee Left Port  Result Date: 04/08/2022 CLINICAL DATA:  Fall EXAM: PORTABLE LEFT KNEE - 1-2 VIEW COMPARISON:  None Available. FINDINGS: There is a comminuted fracture of the lateral tibial plateau. This extends into the tibial spines in his depressed proximally 1 cm. A nondisplaced oblique fracture fragment is seen through the proximal lateral diaphysis of the tibia. There is 6 mm of posterior offset of fracture fragments. There is no  evidence for dislocation. Lipohemarthrosis is present. IMPRESSION: 1. Comminuted fracture of the lateral tibial plateau with extension into the tibial spines and proximal diaphysis. 2. Lipohemarthrosis. Electronically Signed   By: Ronney Asters M.D.   On: 04/08/2022 17:33    EKG: not done   Labs on Admission: I have personally reviewed the available labs and imaging studies at the time of the admission.  Pertinent labs:    K+ 3.0 CO2 20 Glucose 118 WBC 18.3 Hgb 11.4 Platelets 424 HIV negative   Assessment and Plan: Principal Problem:   Tibial plateau fracture, left, closed, initial encounter Active Problems:   Essential hypertension   OSA on CPAP   Asthma   Chronic pain syndrome   Dyslipidemia   Urinary incontinence    Bicondylar Tibial Plateau Fracture -Apparently mechanical fall resulting in tibial plateau fracture -Orthopedics consulted -Continue knee immobilizer -NPO after midnight in anticipation of surgical repair tomorrow -SCDs overnight, start Lovenox post-operatively (or as per ortho) -Pain control with Tylenol, Robaxin, Oxycodone, Toradol, and Dilaudid prn -TOC team consult for rehab placement -Will need PT consult post-operatively -Hip fracture order set utilized -TXA per orthopedics  Pre-operative stratification -Orthopedic/spinal surgery is associated with an intermediate (1-5%) cardiovascular risk for cardiac death and nonfatal MI -She does not have known h/o CAD or CHF and does not report CP/SOB (she does take ASA) -It is reasonable for her to go to the OR without additional evaluation  Chronic pain -I have reviewed this patient in the [place] Controlled Substances Reporting System.  She is receiving medications from only one provider and appears to be taking them as prescribed. -He is not at particularly high risk of overdose but is at increased risk of opioid misuse or diversion. -Continue oxycodone for now with additional parenteral medication for  breakthrough. -Continue nortriptyline, trazodone   Essential hypertension -Continue amlodipine   OSA on CPAP -Continue CPAP   Asthma -Continue Albuterol as needed -Continue Astelin, Zyrtec (Claritin per formulary)  HLD -Continue atorvastatin  Urine incontinence -Continue  Myrbetriq     Advance Care Planning:   Code Status: Full Code - Code status was discussed with the patient and/or family at the time of admission.  The patient would want to receive full resuscitative measures at this time.   Consults: Orthopedics; SW, Nutrition; will need PT post-operatively  DVT Prophylaxis: SCDs until approved for Lovenox by orthopedics  Family Communication: Mother and 2 sisters were present throughout evaluation  Severity of Illness: The appropriate patient status for this patient is INPATIENT. Inpatient status is judged to be reasonable and necessary in order to provide the required intensity of service to ensure the patient's safety. The patient's presenting symptoms, physical exam findings, and initial radiographic and laboratory data in the context of their chronic comorbidities is felt to place them at high risk for further clinical deterioration. Furthermore, it is not anticipated that the patient will be medically stable for discharge from the hospital within 2 midnights of admission.   * I certify that at the point of admission it is my clinical judgment that the patient will require inpatient hospital care spanning beyond 2 midnights from the point of admission due to high intensity of service, high risk for further deterioration and high frequency of surveillance required.*  Author: Karmen Bongo, MD 04/09/2022 2:42 PM  For on call review [email or web address].

## 2022-04-09 NOTE — Plan of Care (Signed)

## 2022-04-09 NOTE — Progress Notes (Signed)
RT spoke to patient about wearing CPAP and patient stated that she had been experiencing some nausea and discomfort. RT explained that would not place patient on CPAP if she is feeling nauseous  Family at bedside stated that they would bring patient's personal CPAP tomorrow.  RT advised that she would come back to reassess patient for placement of hospital provided CPAP.  Patient stated in the past that she has worn nasal cannula when she didn't have CPAP.  RT made RN aware of conversation and the plan.  RT will continue to monitor.

## 2022-04-09 NOTE — Consult Note (Addendum)
Reason for Consult:Left tibia plateau fx Referring Physician: Karmen Bongo Time called: [id number] Time at bedside: Rachael Jordan is an 67 y.o. female.  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 She had immediate pain and could not bear weight. She was brought to the ED at [facility] where x-rays showed a tibia plateau fx. Orthopedic surgery was consulted and she was transferred to [facility] for definitive care.  Past Medical History:  Diagnosis Date   Acid reflux    Anxiety    Arthritis    Asthma    Bronchitis 09/2015   CVA (cerebral vascular accident) ([place])    Gross hematuria    H. pylori infection    Heart murmur    Kidney failure    Labile essential hypertension 01/06/2018   Lung abnormality    "damaged lung due to pneumonia"   Neck pain    Nephrolithiasis    Pneumonia    Pneumothorax    Scoliosis    Seasonal allergies    Urinary frequency    Vaginal atrophy     Past Surgical History:  Procedure Laterality Date   ABDOMINAL HYSTERECTOMY     vaginal   APPENDECTOMY     BREAST BIOPSY     COLONOSCOPY N/A 01/02/2020   Procedure: COLONOSCOPY;  Surgeon: Toledo, Benay Pike, MD;  Location: [facility] ENDOSCOPY;  Service: Gastroenterology;  Laterality: N/A;   COLONOSCOPY WITH PROPOFOL N/A 12/29/2018   Procedure: COLONOSCOPY WITH PROPOFOL;  Surgeon: Lucilla Lame, MD;  Location: [facility] ENDOSCOPY;  Service: Endoscopy;  Laterality: N/A;   ESOPHAGOGASTRODUODENOSCOPY N/A 01/02/2020   Procedure: ESOPHAGOGASTRODUODENOSCOPY (EGD);  Surgeon: Toledo, Benay Pike, MD;  Location: [facility] ENDOSCOPY;  Service: Gastroenterology;  Laterality: N/A;   FOOT SURGERY Left    10/2015   NECK SURGERY      Family History  Problem Relation Age of Onset   Arthritis Mother    Asthma Mother    Hyperlipidemia Mother    Hypertension Mother    Diabetes Sister    Cancer Maternal Aunt        esophagus   Breast cancer Neg Hx    Kidney cancer Neg Hx     Bladder Cancer Neg Hx     Social History:  reports that she has never smoked. She has never used smokeless tobacco. She reports that she does not drink alcohol and does not use drugs.  Allergies:  Allergies  Allergen Reactions   Morphine And Related Itching   Oxymorphone Itching   Grapefruit Extract Rash   Oxycodone Itching   Propoxyphene Nausea And Vomiting and Nausea Only    Medications: I have reviewed the patient's current medications.  Results for orders placed or performed during the hospital encounter of 04/08/22 (from the past 48 hour(s))  CBC with Differential     Status: Abnormal   Collection Time: 04/08/22  8:10 PM  Result Value Ref Range   WBC 18.3 (H) 4.0 - 10.5 K/uL   RBC 3.83 (L) 3.87 - 5.11 MIL/uL   Hemoglobin 11.4 (L) 12.0 - 15.0 g/dL   HCT 34.1 (L) 36.0 - 46.0 %   MCV 89.0 80.0 - 100.0 fL   MCH 29.8 26.0 - 34.0 pg   MCHC 33.4 30.0 - 36.0 g/dL   RDW 13.1 11.5 - 15.5 %   Platelets 424 (H) 150 - 400 K/uL   nRBC 0.0 0.0 - 0.2 %   Neutrophils Relative % 81 %  Neutro Abs 14.7 (H) 1.7 - 7.7 K/uL   Lymphocytes Relative 12 %   Lymphs Abs 2.2 0.7 - 4.0 K/uL   Monocytes Relative 7 %   Monocytes Absolute 1.3 (H) 0.1 - 1.0 K/uL   Eosinophils Relative 0 %   Eosinophils Absolute 0.0 0.0 - 0.5 K/uL   Basophils Relative 0 %   Basophils Absolute 0.1 0.0 - 0.1 K/uL   Immature Granulocytes 0 %   Abs Immature Granulocytes 0.08 (H) 0.00 - 0.07 K/uL    Comment: Performed at [facility], [address] [place], [place] [address]  Basic metabolic panel     Status: Abnormal   Collection Time: 04/08/22  8:10 PM  Result Value Ref Range   Sodium 137 135 - 145 mmol/L   Potassium 3.0 (L) 3.5 - 5.1 mmol/L   Chloride 105 98 - 111 mmol/L   CO2 20 (L) 22 - 32 mmol/L   Glucose, Bld 118 (H) 70 - 99 mg/dL    Comment: Glucose reference range applies only to samples taken after fasting for at least 8 hours.   BUN 10 8 - 23 mg/dL   Creatinine, Ser 0.73 0.44 - 1.00 mg/dL    Calcium 9.3 8.9 - 10.3 mg/dL   GFR, Estimated >60 >60 mL/min    Comment: (NOTE) Calculated using the CKD-EPI Creatinine Equation (2021)    Anion gap 12 5 - 15    Comment: Performed at [facility], [address] [place], [place] [address]  HIV Antibody (routine testing w rflx)     Status: None   Collection Time: 04/09/22  2:58 AM  Result Value Ref Range   HIV Screen 4th Generation wRfx Non Reactive Non Reactive    Comment: Performed at [facility] [address] [place], [place] [address]    CT Hip Left Wo Contrast  Result Date: 04/08/2022 CLINICAL DATA:  Trauma, fall EXAM: CT OF THE LEFT HIP WITHOUT CONTRAST TECHNIQUE: Multidetector CT imaging of the left hip was performed according to the standard protocol. Multiplanar CT image reconstructions were also generated. RADIATION DOSE REDUCTION: This exam was performed according to the departmental dose-optimization program which includes automated exposure control, adjustment of the mA and/or kV according to patient size and/or use of iterative reconstruction technique. COMPARISON:  None Available. FINDINGS: Bones/Joint/Cartilage No fracture or malalignment. Left SI joint is non widened. Pubic symphysis and rami appear intact. No significant hip effusion. The joint space appears patent. Ligaments Suboptimally assessed by CT. Muscles and Tendons No significant atrophy.  No intramuscular fluid collections Soft tissues Negative IMPRESSION: No CT evidence for acute osseous abnormality of the left hip. Electronically Signed   By: Donavan Foil M.D.   On: 04/08/2022 18:59   CT Knee Left Wo Contrast  Result Date: 04/08/2022 CLINICAL DATA:  Knee injury EXAM: CT OF THE LEFT KNEE WITHOUT CONTRAST TECHNIQUE: Multidetector CT imaging of the left knee was performed according to the standard protocol. Multiplanar CT image reconstructions were also generated. RADIATION DOSE REDUCTION: This exam was performed according to the departmental  dose-optimization program which includes automated exposure control, adjustment of the mA and/or kV according to patient size and/or use of iterative reconstruction technique. COMPARISON:  Radiograph 04/08/2022. FINDINGS: Bones/Joint/Cartilage Acute comminuted intra-articular proximal tibial fracture for involves both the medial and lateral tibial plateaus with fracture lucency extending through the tibial spines. About 5 mm depression of the lateral tibial plateau fracture. About 2 mm depression of the medial tibial plateau. Nondisplaced cortical  fracture lucency at the posterior to posterolateral cortex of tibia extends to at least the proximal shaft, incomplete visualization of distal extent. No definitive fibular or patellar fracture. Large lipohemarthrosis. Ligaments Suboptimally assessed by CT. Muscles and Tendons No significant atrophy. No intramuscular fluid collections. Patellar and quadriceps tendons appear grossly intact. Soft tissues Soft tissue swelling about the knee. Punctate focus of gas within the soft tissues lateral side of the knee at the level of patella, no obvious open wound in the vicinity. IMPRESSION: 1. Acute comminuted intra-articular proximal tibial fracture involves both the medial and lateral tibial plateaus with extension of lucency through the tibial spines. Lateral greater than medial depression of fracture fragments as above. Nondisplaced fracture lucency is visualized at the posterior to posterolateral cortex of the tibia to the level of the proximal shaft but the inferior extent is incompletely visualized. There is associated large lipohemarthrosis. Electronically Signed   By: Donavan Foil M.D.   On: 04/08/2022 18:57   DG Knee Left Port  Result Date: 04/08/2022 CLINICAL DATA:  Fall EXAM: PORTABLE LEFT KNEE - 1-2 VIEW COMPARISON:  None Available. FINDINGS: There is a comminuted fracture of the lateral tibial plateau. This extends into the tibial spines in his depressed  proximally 1 cm. A nondisplaced oblique fracture fragment is seen through the proximal lateral diaphysis of the tibia. There is 6 mm of posterior offset of fracture fragments. There is no evidence for dislocation. Lipohemarthrosis is present. IMPRESSION: 1. Comminuted fracture of the lateral tibial plateau with extension into the tibial spines and proximal diaphysis. 2. Lipohemarthrosis. Electronically Signed   By: Ronney Asters M.D.   On: 04/08/2022 17:33    Review of Systems  HENT:  Negative for ear discharge, ear pain, hearing loss and tinnitus.   Eyes:  Negative for photophobia and pain.  Respiratory:  Negative for cough and shortness of breath.   Cardiovascular:  Negative for chest pain.  Gastrointestinal:  Negative for abdominal pain, nausea and vomiting.  Genitourinary:  Negative for dysuria, flank pain, frequency and urgency.  Musculoskeletal:  Positive for arthralgias (Left knee). Negative for back pain, myalgias and neck pain.  Neurological:  Negative for dizziness and headaches.  Hematological:  Does not bruise/bleed easily.  Psychiatric/Behavioral:  The patient is not nervous/anxious.    Blood pressure 120/73, pulse 94, resp. rate 18, SpO2 95 %. Physical Exam Constitutional:      General: She is not in acute distress.    Appearance: She is well-developed. She is not diaphoretic.  HENT:     Head: Normocephalic and atraumatic.  Eyes:     General: No scleral icterus.       Right eye: No discharge.        Left eye: No discharge.     Conjunctiva/sclera: Conjunctivae normal.  Cardiovascular:     Rate and Rhythm: Normal rate and regular rhythm.  Pulmonary:     Effort: Pulmonary effort is normal. No respiratory distress.  Musculoskeletal:     Cervical back: Normal range of motion.     Comments: LLE No traumatic wounds, ecchymosis, or rash  KI in place, severe TTP knee  No ankle effusion  Sens DPN, SPN, TN intact  Motor EHL, ext, flex, evers 5/5  DP 2+, PT 2+, No significant  edema  Skin:    General: Skin is warm and dry.  Neurological:     Mental Status: She is alert.  Psychiatric:        Mood and Affect: Mood normal.  Behavior: Behavior normal.     Assessment/Plan: Left tibia plateau fx -- Plan ORIF tomorrow with Dr. Doreatha Martin. Please keep NPO after MN. Multiple medical problems including chronic pain on chronic narcotics, hypertension, and asthma -- per primary service    Lisette Abu, PA-C Orthopedic Surgery [phone] 04/09/2022, 2:09 PM   Patient seen and examined and agree with the note above.  67 year old female with bicondylar tibial plateau fracture.  The patient will require open reduction internal fixation.  Risk and benefits were discussed with the patient.  Risks include but not limited to bleeding, infection, malunion, nonunion, hardware failure, hardware irritation, nerve or blood vessel injury, DVT, even the possibility anesthetic complications.  She agreed to proceed with surgery and consent was obtained.  Shona Needles, MD [facility] [phone] (office) [email or web address]

## 2022-04-09 NOTE — Anesthesia Preprocedure Evaluation (Signed)
Anesthesia Evaluation  Patient identified by MRN, date of birth, ID band Patient awake    Reviewed: Allergy & Precautions, NPO status , Patient's Chart, lab work & pertinent test results  History of Anesthesia Complications Negative for: history of anesthetic complications  Airway Mallampati: III  TM Distance: >3 FB Neck ROM: Full    Dental  (+) Dental Advisory Given   Pulmonary asthma , COPD   breath sounds clear to auscultation       Cardiovascular hypertension, Pt. on medications  Rhythm:Regular Rate:Normal     Neuro/Psych  Headaches  Anxiety Depression    CVA    GI/Hepatic Neg liver ROS, PUD,GERD  ,,  Endo/Other  negative endocrine ROS    Renal/GU Renal InsufficiencyRenal disease  negative genitourinary   Musculoskeletal  (+) Arthritis ,    Abdominal   Peds  Hematology negative hematology ROS (+)   Anesthesia Other Findings Day of surgery medications reviewed with patient.  Reproductive/Obstetrics negative OB ROS                             Anesthesia Physical Anesthesia Plan  ASA: 2  Anesthesia Plan: General   Post-op Pain Management: Tylenol PO (pre-op)*, Toradol IV (intra-op)*, Ketamine IV* and Dilaudid IV   Induction: Intravenous  PONV Risk Score and Plan: 3 and Dexamethasone, Ondansetron, Midazolam and Treatment may vary due to age or medical condition  Airway Management Planned: Oral ETT  Additional Equipment: None  Intra-op Plan:   Post-operative Plan: Extubation in OR  Informed Consent: I have reviewed the patients History and Physical, chart, labs and discussed the procedure including the risks, benefits and alternatives for the proposed anesthesia with the patient or authorized representative who has indicated his/her understanding and acceptance.     Dental advisory given  Plan Discussed with: CRNA  Anesthesia Plan Comments:        Anesthesia Quick  Evaluation

## 2022-04-09 NOTE — H&P (View-Only) (Signed)
Reason for Consult:Left tibia plateau fx Referring Physician: Karmen Bongo Time called: [phone] Time at bedside: [place] is an 67 y.o. female.  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 She had immediate pain and could not bear weight. She was brought to the ED at [facility] where x-rays showed a tibia plateau fx. Orthopedic surgery was consulted and she was transferred to [facility] for definitive care.  Past Medical History:  Diagnosis Date   Acid reflux    Anxiety    Arthritis    Asthma    Bronchitis 09/2015   CVA (cerebral vascular accident) ([place])    Gross hematuria    H. pylori infection    Heart murmur    Kidney failure    Labile essential hypertension 01/06/2018   Lung abnormality    "damaged lung due to pneumonia"   Neck pain    Nephrolithiasis    Pneumonia    Pneumothorax    Scoliosis    Seasonal allergies    Urinary frequency    Vaginal atrophy     Past Surgical History:  Procedure Laterality Date   ABDOMINAL HYSTERECTOMY     vaginal   APPENDECTOMY     BREAST BIOPSY     COLONOSCOPY N/A 01/02/2020   Procedure: COLONOSCOPY;  Surgeon: Toledo, Benay Pike, MD;  Location: [facility] ENDOSCOPY;  Service: Gastroenterology;  Laterality: N/A;   COLONOSCOPY WITH PROPOFOL N/A 12/29/2018   Procedure: COLONOSCOPY WITH PROPOFOL;  Surgeon: Lucilla Lame, MD;  Location: [facility] ENDOSCOPY;  Service: Endoscopy;  Laterality: N/A;   ESOPHAGOGASTRODUODENOSCOPY N/A 01/02/2020   Procedure: ESOPHAGOGASTRODUODENOSCOPY (EGD);  Surgeon: Toledo, Benay Pike, MD;  Location: [facility] ENDOSCOPY;  Service: Gastroenterology;  Laterality: N/A;   FOOT SURGERY Left    10/2015   NECK SURGERY      Family History  Problem Relation Age of Onset   Arthritis Mother    Asthma Mother    Hyperlipidemia Mother    Hypertension Mother    Diabetes Sister    Cancer Maternal Aunt        esophagus   Breast cancer Neg Hx    Kidney cancer Neg Hx     Bladder Cancer Neg Hx     Social History:  reports that she has never smoked. She has never used smokeless tobacco. She reports that she does not drink alcohol and does not use drugs.  Allergies:  Allergies  Allergen Reactions   Morphine And Related Itching   Oxymorphone Itching   Grapefruit Extract Rash   Oxycodone Itching   Propoxyphene Nausea And Vomiting and Nausea Only    Medications: I have reviewed the patient's current medications.  Results for orders placed or performed during the hospital encounter of 04/08/22 (from the past 48 hour(s))  CBC with Differential     Status: Abnormal   Collection Time: 04/08/22  8:10 PM  Result Value Ref Range   WBC 18.3 (H) 4.0 - 10.5 K/uL   RBC 3.83 (L) 3.87 - 5.11 MIL/uL   Hemoglobin 11.4 (L) 12.0 - 15.0 g/dL   HCT 34.1 (L) 36.0 - 46.0 %   MCV 89.0 80.0 - 100.0 fL   MCH 29.8 26.0 - 34.0 pg   MCHC 33.4 30.0 - 36.0 g/dL   RDW 13.1 11.5 - 15.5 %   Platelets 424 (H) 150 - 400 K/uL   nRBC 0.0 0.0 - 0.2 %   Neutrophils Relative % 81 %  Neutro Abs 14.7 (H) 1.7 - 7.7 K/uL   Lymphocytes Relative 12 %   Lymphs Abs 2.2 0.7 - 4.0 K/uL   Monocytes Relative 7 %   Monocytes Absolute 1.3 (H) 0.1 - 1.0 K/uL   Eosinophils Relative 0 %   Eosinophils Absolute 0.0 0.0 - 0.5 K/uL   Basophils Relative 0 %   Basophils Absolute 0.1 0.0 - 0.1 K/uL   Immature Granulocytes 0 %   Abs Immature Granulocytes 0.08 (H) 0.00 - 0.07 K/uL    Comment: Performed at [facility], [address], [place], [place] [address]  Basic metabolic panel     Status: Abnormal   Collection Time: 04/08/22  8:10 PM  Result Value Ref Range   Sodium 137 135 - 145 mmol/L   Potassium 3.0 (L) 3.5 - 5.1 mmol/L   Chloride 105 98 - 111 mmol/L   CO2 20 (L) 22 - 32 mmol/L   Glucose, Bld 118 (H) 70 - 99 mg/dL    Comment: Glucose reference range applies only to samples taken after fasting for at least 8 hours.   BUN 10 8 - 23 mg/dL   Creatinine, Ser 0.73 0.44 - 1.00 mg/dL    Calcium 9.3 8.9 - 10.3 mg/dL   GFR, Estimated >60 >60 mL/min    Comment: (NOTE) Calculated using the CKD-EPI Creatinine Equation (2021)    Anion gap 12 5 - 15    Comment: Performed at [facility], [address] [place], [place] [address]  HIV Antibody (routine testing w rflx)     Status: None   Collection Time: 04/09/22  2:58 AM  Result Value Ref Range   HIV Screen 4th Generation wRfx Non Reactive Non Reactive    Comment: Performed at [facility] [address], [place], [place] [address]    CT Hip Left Wo Contrast  Result Date: 04/08/2022 CLINICAL DATA:  Trauma, fall EXAM: CT OF THE LEFT HIP WITHOUT CONTRAST TECHNIQUE: Multidetector CT imaging of the left hip was performed according to the standard protocol. Multiplanar CT image reconstructions were also generated. RADIATION DOSE REDUCTION: This exam was performed according to the departmental dose-optimization program which includes automated exposure control, adjustment of the mA and/or kV according to patient size and/or use of iterative reconstruction technique. COMPARISON:  None Available. FINDINGS: Bones/Joint/Cartilage No fracture or malalignment. Left SI joint is non widened. Pubic symphysis and rami appear intact. No significant hip effusion. The joint space appears patent. Ligaments Suboptimally assessed by CT. Muscles and Tendons No significant atrophy.  No intramuscular fluid collections Soft tissues Negative IMPRESSION: No CT evidence for acute osseous abnormality of the left hip. Electronically Signed   By: Donavan Foil M.D.   On: 04/08/2022 18:59   CT Knee Left Wo Contrast  Result Date: 04/08/2022 CLINICAL DATA:  Knee injury EXAM: CT OF THE LEFT KNEE WITHOUT CONTRAST TECHNIQUE: Multidetector CT imaging of the left knee was performed according to the standard protocol. Multiplanar CT image reconstructions were also generated. RADIATION DOSE REDUCTION: This exam was performed according to the departmental  dose-optimization program which includes automated exposure control, adjustment of the mA and/or kV according to patient size and/or use of iterative reconstruction technique. COMPARISON:  Radiograph 04/08/2022. FINDINGS: Bones/Joint/Cartilage Acute comminuted intra-articular proximal tibial fracture for involves both the medial and lateral tibial plateaus with fracture lucency extending through the tibial spines. About 5 mm depression of the lateral tibial plateau fracture. About 2 mm depression of the medial tibial plateau. Nondisplaced cortical  fracture lucency at the posterior to posterolateral cortex of tibia extends to at least the proximal shaft, incomplete visualization of distal extent. No definitive fibular or patellar fracture. Large lipohemarthrosis. Ligaments Suboptimally assessed by CT. Muscles and Tendons No significant atrophy. No intramuscular fluid collections. Patellar and quadriceps tendons appear grossly intact. Soft tissues Soft tissue swelling about the knee. Punctate focus of gas within the soft tissues lateral side of the knee at the level of patella, no obvious open wound in the vicinity. IMPRESSION: 1. Acute comminuted intra-articular proximal tibial fracture involves both the medial and lateral tibial plateaus with extension of lucency through the tibial spines. Lateral greater than medial depression of fracture fragments as above. Nondisplaced fracture lucency is visualized at the posterior to posterolateral cortex of the tibia to the level of the proximal shaft but the inferior extent is incompletely visualized. There is associated large lipohemarthrosis. Electronically Signed   By: Donavan Foil M.D.   On: 04/08/2022 18:57   DG Knee Left Port  Result Date: 04/08/2022 CLINICAL DATA:  Fall EXAM: PORTABLE LEFT KNEE - 1-2 VIEW COMPARISON:  None Available. FINDINGS: There is a comminuted fracture of the lateral tibial plateau. This extends into the tibial spines in his depressed  proximally 1 cm. A nondisplaced oblique fracture fragment is seen through the proximal lateral diaphysis of the tibia. There is 6 mm of posterior offset of fracture fragments. There is no evidence for dislocation. Lipohemarthrosis is present. IMPRESSION: 1. Comminuted fracture of the lateral tibial plateau with extension into the tibial spines and proximal diaphysis. 2. Lipohemarthrosis. Electronically Signed   By: Ronney Asters M.D.   On: 04/08/2022 17:33    Review of Systems  HENT:  Negative for ear discharge, ear pain, hearing loss and tinnitus.   Eyes:  Negative for photophobia and pain.  Respiratory:  Negative for cough and shortness of breath.   Cardiovascular:  Negative for chest pain.  Gastrointestinal:  Negative for abdominal pain, nausea and vomiting.  Genitourinary:  Negative for dysuria, flank pain, frequency and urgency.  Musculoskeletal:  Positive for arthralgias (Left knee). Negative for back pain, myalgias and neck pain.  Neurological:  Negative for dizziness and headaches.  Hematological:  Does not bruise/bleed easily.  Psychiatric/Behavioral:  The patient is not nervous/anxious.    Blood pressure 120/73, pulse 94, resp. rate 18, SpO2 95 %. Physical Exam Constitutional:      General: She is not in acute distress.    Appearance: She is well-developed. She is not diaphoretic.  HENT:     Head: Normocephalic and atraumatic.  Eyes:     General: No scleral icterus.       Right eye: No discharge.        Left eye: No discharge.     Conjunctiva/sclera: Conjunctivae normal.  Cardiovascular:     Rate and Rhythm: Normal rate and regular rhythm.  Pulmonary:     Effort: Pulmonary effort is normal. No respiratory distress.  Musculoskeletal:     Cervical back: Normal range of motion.     Comments: LLE No traumatic wounds, ecchymosis, or rash  KI in place, severe TTP knee  No ankle effusion  Sens DPN, SPN, TN intact  Motor EHL, ext, flex, evers 5/5  DP 2+, PT 2+, No significant  edema  Skin:    General: Skin is warm and dry.  Neurological:     Mental Status: She is alert.  Psychiatric:        Mood and Affect: Mood normal.  Behavior: Behavior normal.     Assessment/Plan: Left tibia plateau fx -- Plan ORIF tomorrow with Dr. Doreatha Martin. Please keep NPO after MN. Multiple medical problems including chronic pain on chronic narcotics, hypertension, and asthma -- per primary service    Lisette Abu, PA-C Orthopedic Surgery [phone] 04/09/2022, 2:09 PM   Patient seen and examined and agree with the note above.  67 year old female with bicondylar tibial plateau fracture.  The patient will require open reduction internal fixation.  Risk and benefits were discussed with the patient.  Risks include but not limited to bleeding, infection, malunion, nonunion, hardware failure, hardware irritation, nerve or blood vessel injury, DVT, even the possibility anesthetic complications.  She agreed to proceed with surgery and consent was obtained.  Shona Needles, MD [facility] [phone] (office) [email or web address]

## 2022-04-09 NOTE — Discharge Summary (Signed)
Physician Discharge Summary   Patient: Rachael Jordan MRN: [id number] DOB: Mar 19, 1955  Admit date:     04/08/2022  Discharge date: 04/09/22  Discharge Physician: Annita Brod   PCP: Sharyne Peach, MD   Recommendations at discharge:   Patient being transferred to [facility] for surgery this morning, bilateral tibial fracture repair Patient to remain n.p.o. until after surgery 67 year old female with past medical history of chronic pain 66  Discharge Diagnoses: Principal Problem:   Bilateral closed proximal tibial fracture Active Problems:   Back pain, chronic   Essential hypertension   Depression with anxiety   OSA on CPAP   Asthma   Overweight (BMI 25.0-29.9)  Resolved Problems:   * No resolved hospital problems. *  Hospital Course: 67 year old female with past medical history of chronic pain on opiate medications and hypertension who presented to the emergency room on 1/122 after fall after accidentally tripping over a baby gate.  Patient unable to stand and was brought in and found to have acute comminuted bicondylar tibial plateau fracture.  Given nature of fracture, felt best for transfer to [facility] trauma surgery for surgery to be performed 1/23.  Patient monitored overnight at [facility], made n.p.o. after midnight and then to be transferred this morning.  Assessment and Plan: * Bilateral closed proximal tibial fracture Patient in knee immobilizer and pain control using home oxycodone, IV Toradol, IV Dilaudid.  Ice packs.  SCDs for DVT prophylaxis.  Has been n.p.o. since midnight.  Transferring to trauma surgery at [facility] this morning.  Dr. Marcelino Scot is the orthopedic surgeon there.  Back pain, chronic Chronic opioid therapy Continue oxycodone  Essential hypertension Continue amlodipine  Depression with anxiety Continue home trazodone and nortriptyline pending med rec  OSA on CPAP BiPAP nightly  Asthma Continue home maintenance  inhaler Albuterol as needed        Pain control - [place] Controlled Substance Reporting System database was reviewed. and patient was instructed, not to drive, operate heavy machinery, perform activities at heights, swimming or participation in water activities or provide baby-sitting services while on Pain, Sleep and Anxiety Medications; until their outpatient Physician has advised to do so again. Also recommended to not to take more than prescribed Pain, Sleep and Anxiety Medications.  Consultants: Orthopedic surgery Procedures performed: None Disposition: Transfer to [facility] Diet recommendation:  N.p.o. DISCHARGE MEDICATION: Allergies as of 04/09/2022       Reactions   Morphine And Related Itching   Oxymorphone Itching   Grapefruit Extract Rash   Oxycodone Itching   Propoxyphene Nausea And Vomiting, Nausea Only        Medication List     TAKE these medications    albuterol (2.5 MG/3ML) 0.083% nebulizer solution Commonly known as: PROVENTIL Take 2.5 mg by nebulization every 6 (six) hours as needed for wheezing or shortness of breath.   albuterol 108 (90 Base) MCG/ACT inhaler Commonly known as: VENTOLIN HFA Inhale 2 puffs into the lungs every 6 (six) hours as needed for wheezing or shortness of breath.   amLODipine 5 MG tablet Commonly known as: NORVASC Take 5 mg by mouth daily.   ammonium lactate 12 % lotion Commonly known as: AmLactin Apply 1 application topically as needed for dry skin.   ascorbic acid 500 MG tablet Commonly known as: VITAMIN C Take 500 mg by mouth daily.   aspirin EC 81 MG tablet Take 81 mg by mouth daily.   atorvastatin 20 MG tablet Commonly known  as: LIPITOR Take 20 mg by mouth daily.   azelastine 0.1 % nasal spray Commonly known as: ASTELIN Place 1-2 sprays into both nostrils 2 (two) times daily.   B-12 1000 MCG Tabs Take by mouth daily.   Biotin 10000 MCG Tabs Take 10 mg by mouth daily.   CENTRUM ADULTS  PO Take 1 tablet by mouth daily.   cetirizine 10 MG tablet Commonly known as: ZYRTEC Take 10 mg by mouth at bedtime.   Cholecalciferol 25 MCG (1000 UT) tablet Take 1,000 Units by mouth daily.   ciclopirox 8 % solution Commonly known as: Penlac Apply topically at bedtime. Apply over nail and surrounding skin. Apply daily over previous coat. After seven (7) days, may remove with alcohol and continue cycle.   diclofenac Sodium 1 % Gel Commonly known as: VOLTAREN Apply topically.   gabapentin 800 MG tablet Commonly known as: NEURONTIN Take 800 mg by mouth daily as needed. Causes hair loss   ipratropium 0.06 % nasal spray Commonly known as: ATROVENT Place into the nose.   lidocaine 5 % Commonly known as: Lidoderm Place 1 patch onto the skin daily. Remove & Discard patch within 12 hours or as directed by MD   mirabegron ER 50 MG Tb24 tablet Commonly known as: MYRBETRIQ Take 1 tablet (50 mg total) by mouth daily.   montelukast 10 MG tablet Commonly known as: SINGULAIR Take 10 mg by mouth in the morning.   naloxone 4 MG/0.1ML Liqd nasal spray kit Commonly known as: NARCAN SMARTSIG:Both Nares   naloxone 4 MG/0.1ML Liqd nasal spray kit Commonly known as: NARCAN Take as prescribed for opoid overdose   nortriptyline 10 MG capsule Commonly known as: PAMELOR Take 10 mg by mouth 2 (two) times daily.   oxyCODONE-acetaminophen 7.5-325 MG tablet Commonly known as: Percocet Take 1 tablet by mouth every 4 (four) hours as needed.   pantoprazole 40 MG tablet Commonly known as: PROTONIX Take 40 mg by mouth daily.   promethazine 25 MG tablet Commonly known as: PHENERGAN Take 1 tablet (25 mg total) by mouth every 8 (eight) hours as needed for nausea or vomiting.   pyridOXINE 100 MG tablet Commonly known as: VITAMIN B6 Take 100 mg by mouth daily.   rizatriptan 10 MG tablet Commonly known as: MAXALT Take 10 mg by mouth as needed for migraine. May repeat in 2 hours if needed    tiZANidine 4 MG tablet Commonly known as: ZANAFLEX Take by mouth.   traZODone 100 MG tablet Commonly known as: DESYREL Take 1 tablet (100 mg total) by mouth at bedtime. What changed: how much to take   valACYclovir 1000 MG tablet Commonly known as: VALTREX Take 1,000 mg by mouth daily as needed.   Vitamin E 45 MG Caps Take 90 mg by mouth daily.        Discharge Exam: Filed Weights   04/08/22 1611 04/09/22 0145  Weight: 70.8 kg 70.8 kg   General: Alert and oriented x 3, no acute distress Cardiovascular: Regular rate and rhythm, S1-S2  Condition at discharge: fair  The results of significant diagnostics from this hospitalization (including imaging, microbiology, ancillary and laboratory) are listed below for reference.   Imaging Studies: CT Hip Left Wo Contrast  Result Date: 04/08/2022 CLINICAL DATA:  Trauma, fall EXAM: CT OF THE LEFT HIP WITHOUT CONTRAST TECHNIQUE: Multidetector CT imaging of the left hip was performed according to the standard protocol. Multiplanar CT image reconstructions were also generated. RADIATION DOSE REDUCTION: This exam was performed according to  the departmental dose-optimization program which includes automated exposure control, adjustment of the mA and/or kV according to patient size and/or use of iterative reconstruction technique. COMPARISON:  None Available. FINDINGS: Bones/Joint/Cartilage No fracture or malalignment. Left SI joint is non widened. Pubic symphysis and rami appear intact. No significant hip effusion. The joint space appears patent. Ligaments Suboptimally assessed by CT. Muscles and Tendons No significant atrophy.  No intramuscular fluid collections Soft tissues Negative IMPRESSION: No CT evidence for acute osseous abnormality of the left hip. Electronically Signed   By: Donavan Foil M.D.   On: 04/08/2022 18:59   CT Knee Left Wo Contrast  Result Date: 04/08/2022 CLINICAL DATA:  Knee injury EXAM: CT OF THE LEFT KNEE WITHOUT  CONTRAST TECHNIQUE: Multidetector CT imaging of the left knee was performed according to the standard protocol. Multiplanar CT image reconstructions were also generated. RADIATION DOSE REDUCTION: This exam was performed according to the departmental dose-optimization program which includes automated exposure control, adjustment of the mA and/or kV according to patient size and/or use of iterative reconstruction technique. COMPARISON:  Radiograph 04/08/2022. FINDINGS: Bones/Joint/Cartilage Acute comminuted intra-articular proximal tibial fracture for involves both the medial and lateral tibial plateaus with fracture lucency extending through the tibial spines. About 5 mm depression of the lateral tibial plateau fracture. About 2 mm depression of the medial tibial plateau. Nondisplaced cortical fracture lucency at the posterior to posterolateral cortex of tibia extends to at least the proximal shaft, incomplete visualization of distal extent. No definitive fibular or patellar fracture. Large lipohemarthrosis. Ligaments Suboptimally assessed by CT. Muscles and Tendons No significant atrophy. No intramuscular fluid collections. Patellar and quadriceps tendons appear grossly intact. Soft tissues Soft tissue swelling about the knee. Punctate focus of gas within the soft tissues lateral side of the knee at the level of patella, no obvious open wound in the vicinity. IMPRESSION: 1. Acute comminuted intra-articular proximal tibial fracture involves both the medial and lateral tibial plateaus with extension of lucency through the tibial spines. Lateral greater than medial depression of fracture fragments as above. Nondisplaced fracture lucency is visualized at the posterior to posterolateral cortex of the tibia to the level of the proximal shaft but the inferior extent is incompletely visualized. There is associated large lipohemarthrosis. Electronically Signed   By: Donavan Foil M.D.   On: 04/08/2022 18:57   DG Knee Left  Port  Result Date: 04/08/2022 CLINICAL DATA:  Fall EXAM: PORTABLE LEFT KNEE - 1-2 VIEW COMPARISON:  None Available. FINDINGS: There is a comminuted fracture of the lateral tibial plateau. This extends into the tibial spines in his depressed proximally 1 cm. A nondisplaced oblique fracture fragment is seen through the proximal lateral diaphysis of the tibia. There is 6 mm of posterior offset of fracture fragments. There is no evidence for dislocation. Lipohemarthrosis is present. IMPRESSION: 1. Comminuted fracture of the lateral tibial plateau with extension into the tibial spines and proximal diaphysis. 2. Lipohemarthrosis. Electronically Signed   By: Ronney Asters M.D.   On: 04/08/2022 17:33    Microbiology: Results for orders placed or performed during the hospital encounter of 05/11/21  SARS CORONAVIRUS 2 (TAT 6-24 HRS) Nasopharyngeal Nasopharyngeal Swab     Status: None   Collection Time: 05/11/21  8:23 AM   Specimen: Nasopharyngeal Swab  Result Value Ref Range Status   SARS Coronavirus 2 NEGATIVE NEGATIVE Final    Comment: (NOTE) SARS-CoV-2 target nucleic acids are NOT DETECTED.  The SARS-CoV-2 RNA is generally detectable in upper and lower respiratory specimens during  the acute phase of infection. Negative results do not preclude SARS-CoV-2 infection, do not rule out co-infections with other pathogens, and should not be used as the sole basis for treatment or other patient management decisions. Negative results must be combined with clinical observations, patient history, and epidemiological information. The expected result is Negative.  Fact Sheet for Patients: SugarRoll.be  Fact Sheet for Healthcare Providers: https://www.woods-mathews.com/  This test is not yet approved or cleared by the [place] FDA and  has been authorized for detection and/or diagnosis of SARS-CoV-2 by FDA under an Emergency Use Authorization (EUA). This EUA will  remain  in effect (meaning this test can be used) for the duration of the COVID-19 declaration under Se ction 564(b)(1) of the Act, 21 U.S.C. section 360bbb-3(b)(1), unless the authorization is terminated or revoked sooner.  Performed at [facility], [place] [address] [place], [place] [address]     Labs: CBC: Recent Labs  Lab 04/08/22 2010  WBC 18.3*  NEUTROABS 14.7*  HGB 11.4*  HCT 34.1*  MCV 89.0  PLT 559*   Basic Metabolic Panel: Recent Labs  Lab 04/08/22 2010  NA 137  K 3.0*  CL 105  CO2 20*  GLUCOSE 118*  BUN 10  CREATININE 0.73  CALCIUM 9.3   Liver Function Tests: No results for input(s): "AST", "ALT", "ALKPHOS", "BILITOT", "PROT", "ALBUMIN" in the last 168 hours. CBG: No results for input(s): "GLUCAP" in the last 168 hours.  Discharge time spent: less than 30 minutes.  Signed: Annita Brod, MD [facility] 04/09/2022

## 2022-04-09 NOTE — Hospital Course (Signed)
67 year old female with past medical history of chronic pain on opiate medications and hypertension who presented to the emergency room on 1/122 after fall after accidentally tripping over a baby gate.  Patient unable to stand and was brought in and found to have acute comminuted bicondylar tibial plateau fracture.  Given nature of fracture, felt best for transfer to [facility] trauma surgery for surgery to be performed 1/23.  Patient monitored overnight at [facility], made n.p.o. after midnight and then to be transferred this morning.

## 2022-04-09 NOTE — Progress Notes (Signed)
Subjective:  Patient admitted overnight for neurovascular monitoring and pain control.  She sustained a left tibial plateau fracture after a fall yesterday at home.  Patient's pain is better controlled today.  She has family members at the bedside.  Patient is awake and in no acute distress but still has moderate left knee pain.    Objective:   VITALS:   Vitals:   04/09/22 0109 04/09/22 0145 04/09/22 0538 04/09/22 0903  BP: (!) 177/93 (!) 164/89 (!) 149/88 130/74  Pulse: (!) 106 100 95 98  Resp: (!) '21 20 16 17  '$ Temp: 98.2 F (36.8 C) 98.5 F (36.9 C) 98.1 F (36.7 C) 97.9 F (36.6 C)  TempSrc: Oral Oral Oral   SpO2: 100% 100% 96% 99%  Weight:  70.8 kg    Height:  '5\' 5"'$  (1.651 m)      PHYSICAL EXAM: Left lower extremity: Skin is intact. There is no large effusion or hemarthrosis. Compartments are soft and compressible. Patient has palpable pedal pulses and can flex and extend her toes.  She has intact sensation light touch is the left lower extremity.  LABS  Results for orders placed or performed during the hospital encounter of 04/08/22 (from the past 24 hour(s))  CBC with Differential     Status: Abnormal   Collection Time: 04/08/22  8:10 PM  Result Value Ref Range   WBC 18.3 (H) 4.0 - 10.5 K/uL   RBC 3.83 (L) 3.87 - 5.11 MIL/uL   Hemoglobin 11.4 (L) 12.0 - 15.0 g/dL   HCT 34.1 (L) 36.0 - 46.0 %   MCV 89.0 80.0 - 100.0 fL   MCH 29.8 26.0 - 34.0 pg   MCHC 33.4 30.0 - 36.0 g/dL   RDW 13.1 11.5 - 15.5 %   Platelets 424 (H) 150 - 400 K/uL   nRBC 0.0 0.0 - 0.2 %   Neutrophils Relative % 81 %   Neutro Abs 14.7 (H) 1.7 - 7.7 K/uL   Lymphocytes Relative 12 %   Lymphs Abs 2.2 0.7 - 4.0 K/uL   Monocytes Relative 7 %   Monocytes Absolute 1.3 (H) 0.1 - 1.0 K/uL   Eosinophils Relative 0 %   Eosinophils Absolute 0.0 0.0 - 0.5 K/uL   Basophils Relative 0 %   Basophils Absolute 0.1 0.0 - 0.1 K/uL   Immature Granulocytes 0 %   Abs Immature Granulocytes 0.08 (H) 0.00 - 0.07  K/uL  Basic metabolic panel     Status: Abnormal   Collection Time: 04/08/22  8:10 PM  Result Value Ref Range   Sodium 137 135 - 145 mmol/L   Potassium 3.0 (L) 3.5 - 5.1 mmol/L   Chloride 105 98 - 111 mmol/L   CO2 20 (L) 22 - 32 mmol/L   Glucose, Bld 118 (H) 70 - 99 mg/dL   BUN 10 8 - 23 mg/dL   Creatinine, Ser 0.73 0.44 - 1.00 mg/dL   Calcium 9.3 8.9 - 10.3 mg/dL   GFR, Estimated >60 >60 mL/min   Anion gap 12 5 - 15    CT Hip Left Wo Contrast  Result Date: 04/08/2022 CLINICAL DATA:  Trauma, fall EXAM: CT OF THE LEFT HIP WITHOUT CONTRAST TECHNIQUE: Multidetector CT imaging of the left hip was performed according to the standard protocol. Multiplanar CT image reconstructions were also generated. RADIATION DOSE REDUCTION: This exam was performed according to the departmental dose-optimization program which includes automated exposure control, adjustment of the mA and/or kV according to patient size and/or use  of iterative reconstruction technique. COMPARISON:  None Available. FINDINGS: Bones/Joint/Cartilage No fracture or malalignment. Left SI joint is non widened. Pubic symphysis and rami appear intact. No significant hip effusion. The joint space appears patent. Ligaments Suboptimally assessed by CT. Muscles and Tendons No significant atrophy.  No intramuscular fluid collections Soft tissues Negative IMPRESSION: No CT evidence for acute osseous abnormality of the left hip. Electronically Signed   By: Donavan Foil M.D.   On: 04/08/2022 18:59   CT Knee Left Wo Contrast  Result Date: 04/08/2022 CLINICAL DATA:  Knee injury EXAM: CT OF THE LEFT KNEE WITHOUT CONTRAST TECHNIQUE: Multidetector CT imaging of the left knee was performed according to the standard protocol. Multiplanar CT image reconstructions were also generated. RADIATION DOSE REDUCTION: This exam was performed according to the departmental dose-optimization program which includes automated exposure control, adjustment of the mA and/or  kV according to patient size and/or use of iterative reconstruction technique. COMPARISON:  Radiograph 04/08/2022. FINDINGS: Bones/Joint/Cartilage Acute comminuted intra-articular proximal tibial fracture for involves both the medial and lateral tibial plateaus with fracture lucency extending through the tibial spines. About 5 mm depression of the lateral tibial plateau fracture. About 2 mm depression of the medial tibial plateau. Nondisplaced cortical fracture lucency at the posterior to posterolateral cortex of tibia extends to at least the proximal shaft, incomplete visualization of distal extent. No definitive fibular or patellar fracture. Large lipohemarthrosis. Ligaments Suboptimally assessed by CT. Muscles and Tendons No significant atrophy. No intramuscular fluid collections. Patellar and quadriceps tendons appear grossly intact. Soft tissues Soft tissue swelling about the knee. Punctate focus of gas within the soft tissues lateral side of the knee at the level of patella, no obvious open wound in the vicinity. IMPRESSION: 1. Acute comminuted intra-articular proximal tibial fracture involves both the medial and lateral tibial plateaus with extension of lucency through the tibial spines. Lateral greater than medial depression of fracture fragments as above. Nondisplaced fracture lucency is visualized at the posterior to posterolateral cortex of the tibia to the level of the proximal shaft but the inferior extent is incompletely visualized. There is associated large lipohemarthrosis. Electronically Signed   By: Donavan Foil M.D.   On: 04/08/2022 18:57   DG Knee Left Port  Result Date: 04/08/2022 CLINICAL DATA:  Fall EXAM: PORTABLE LEFT KNEE - 1-2 VIEW COMPARISON:  None Available. FINDINGS: There is a comminuted fracture of the lateral tibial plateau. This extends into the tibial spines in his depressed proximally 1 cm. A nondisplaced oblique fracture fragment is seen through the proximal lateral diaphysis  of the tibia. There is 6 mm of posterior offset of fracture fragments. There is no evidence for dislocation. Lipohemarthrosis is present. IMPRESSION: 1. Comminuted fracture of the lateral tibial plateau with extension into the tibial spines and proximal diaphysis. 2. Lipohemarthrosis. Electronically Signed   By: Ronney Asters M.D.   On: 04/08/2022 17:33    Assessment/Plan:     Principal Problem:   Bilateral closed proximal tibial fracture Active Problems:   Back pain, chronic   Essential hypertension   Depression with anxiety   OSA on CPAP   Asthma  Patient is stable.  There are no signs of compartment syndrome.  Patient is being transferred to [facility] today for surgery with Dr. Altamese [place].  Patient must remain n.p.o. she should not receive any anticoagulation therapy today in preparation for surgery    [place] , MD 04/09/2022, 9:12 AM

## 2022-04-10 ENCOUNTER — Inpatient Hospital Stay (HOSPITAL_COMMUNITY): Payer: 59 | Admitting: Certified Registered"

## 2022-04-10 ENCOUNTER — Other Ambulatory Visit: Payer: Self-pay

## 2022-04-10 ENCOUNTER — Inpatient Hospital Stay (HOSPITAL_COMMUNITY): Payer: 59

## 2022-04-10 ENCOUNTER — Encounter (HOSPITAL_COMMUNITY): Payer: Self-pay | Admitting: Internal Medicine

## 2022-04-10 ENCOUNTER — Encounter (HOSPITAL_COMMUNITY)
Admission: AD | Disposition: A | Discharge status: Home Health | Payer: Self-pay | Source: Home / Self Care | Attending: Internal Medicine

## 2022-04-10 DIAGNOSIS — S82142A Displaced bicondylar fracture of left tibia, initial encounter for closed fracture: Secondary | ICD-10-CM

## 2022-04-10 HISTORY — PX: ORIF TIBIA PLATEAU: SHX2132

## 2022-04-10 LAB — BASIC METABOLIC PANEL
Anion gap: 14 (ref 5–15)
BUN: 16 mg/dL (ref 8–23)
CO2: 23 mmol/L (ref 22–32)
Calcium: 8.7 mg/dL — ABNORMAL LOW (ref 8.9–10.3)
Chloride: 97 mmol/L — ABNORMAL LOW (ref 98–111)
Creatinine, Ser: 1.12 mg/dL — ABNORMAL HIGH (ref 0.44–1.00)
GFR, Estimated: 55 mL/min — ABNORMAL LOW (ref >60)
Glucose, Bld: 111 mg/dL — ABNORMAL HIGH (ref 70–99)
Potassium: 3.3 mmol/L — ABNORMAL LOW (ref 3.5–5.1)
Sodium: 134 mmol/L — ABNORMAL LOW (ref 135–145)

## 2022-04-10 LAB — CBC
HCT: 37.6 % (ref 36.0–46.0)
Hemoglobin: 11.9 g/dL — ABNORMAL LOW (ref 12.0–15.0)
MCH: 29.8 pg (ref 26.0–34.0)
MCHC: 31.6 g/dL (ref 30.0–36.0)
MCV: 94.2 fL (ref 80.0–100.0)
Platelets: 394 10*3/uL (ref 150–400)
RBC: 3.99 MIL/uL (ref 3.87–5.11)
RDW: 13.7 % (ref 11.5–15.5)
WBC: 14.2 10*3/uL — ABNORMAL HIGH (ref 4.0–10.5)
nRBC: 0 % (ref 0.0–0.2)

## 2022-04-10 LAB — SURGICAL PCR SCREEN
MRSA, PCR: NEGATIVE
Staphylococcus aureus: POSITIVE — AB

## 2022-04-10 SURGERY — OPEN REDUCTION INTERNAL FIXATION (ORIF) TIBIAL PLATEAU
Anesthesia: General | Site: Leg Lower | Laterality: Left

## 2022-04-10 MED ORDER — KETAMINE HCL 10 MG/ML IJ SOLN
INTRAMUSCULAR | Status: DC | PRN
  Administered 2022-04-10 (×2): 10 mg via INTRAVENOUS

## 2022-04-10 MED ORDER — METOCLOPRAMIDE HCL 5 MG/ML IJ SOLN: 5.0000 mg | Freq: Three times a day (TID) | INTRAMUSCULAR | Status: DC | PRN

## 2022-04-10 MED ORDER — CEFAZOLIN SODIUM-DEXTROSE 2-4 GM/100ML-% IV SOLN
2.0000 g | INTRAVENOUS | Status: AC
  Administered 2022-04-10: 2 g via INTRAVENOUS

## 2022-04-10 MED ORDER — HYDROMORPHONE HCL 1 MG/ML IJ SOLN
INTRAMUSCULAR | Status: AC
  Filled 2022-04-10: qty 1

## 2022-04-10 MED ORDER — ACETAMINOPHEN 325 MG PO TABS
650.0000 mg | ORAL_TABLET | Freq: Four times a day (QID) | ORAL | Status: DC
  Administered 2022-04-10 – 2022-04-11 (×2): 650 mg via ORAL
  Filled 2022-04-10 (×2): qty 2

## 2022-04-10 MED ORDER — CEFAZOLIN SODIUM-DEXTROSE 2-4 GM/100ML-% IV SOLN
2.0000 g | Freq: Three times a day (TID) | INTRAVENOUS | Status: AC
  Administered 2022-04-10 – 2022-04-11 (×3): 2 g via INTRAVENOUS
  Filled 2022-04-10 (×3): qty 100

## 2022-04-10 MED ORDER — CHLORHEXIDINE GLUCONATE 4 % EX LIQD: 60.0000 mL | Freq: Once | CUTANEOUS | Status: DC

## 2022-04-10 MED ORDER — CEFAZOLIN SODIUM-DEXTROSE 2-4 GM/100ML-% IV SOLN
INTRAVENOUS | Status: AC
  Filled 2022-04-10: qty 100

## 2022-04-10 MED ORDER — METOCLOPRAMIDE HCL 5 MG PO TABS: 5.0000 mg | ORAL_TABLET | Freq: Three times a day (TID) | ORAL | Status: DC | PRN

## 2022-04-10 MED ORDER — SODIUM CHLORIDE 0.9 % IV SOLN: INTRAVENOUS | Status: DC

## 2022-04-10 MED ORDER — VANCOMYCIN HCL 1000 MG IV SOLR
INTRAVENOUS | Status: AC
  Filled 2022-04-10: qty 20

## 2022-04-10 MED ORDER — FENTANYL CITRATE (PF) 250 MCG/5ML IJ SOLN
INTRAMUSCULAR | Status: AC
  Filled 2022-04-10: qty 5

## 2022-04-10 MED ORDER — DOCUSATE SODIUM 100 MG PO CAPS
100.0000 mg | ORAL_CAPSULE | Freq: Two times a day (BID) | ORAL | Status: DC
  Administered 2022-04-10 – 2022-04-15 (×8): 100 mg via ORAL
  Filled 2022-04-10 (×9): qty 1

## 2022-04-10 MED ORDER — ROCURONIUM BROMIDE 10 MG/ML (PF) SYRINGE
PREFILLED_SYRINGE | INTRAVENOUS | Status: DC | PRN
  Administered 2022-04-10: 50 mg via INTRAVENOUS

## 2022-04-10 MED ORDER — DEXAMETHASONE SODIUM PHOSPHATE 10 MG/ML IJ SOLN
INTRAMUSCULAR | Status: AC
  Filled 2022-04-10: qty 3

## 2022-04-10 MED ORDER — ENOXAPARIN SODIUM 40 MG/0.4ML IJ SOSY
40.0000 mg | PREFILLED_SYRINGE | INTRAMUSCULAR | Status: DC
  Administered 2022-04-11 – 2022-04-15 (×5): 40 mg via SUBCUTANEOUS
  Filled 2022-04-10 (×5): qty 0.4

## 2022-04-10 MED ORDER — ROCURONIUM BROMIDE 10 MG/ML (PF) SYRINGE
PREFILLED_SYRINGE | INTRAVENOUS | Status: AC
  Filled 2022-04-10: qty 30

## 2022-04-10 MED ORDER — PHENYLEPHRINE 80 MCG/ML (10ML) SYRINGE FOR IV PUSH (FOR BLOOD PRESSURE SUPPORT)
PREFILLED_SYRINGE | INTRAVENOUS | Status: DC | PRN
  Administered 2022-04-10: 160 ug via INTRAVENOUS
  Administered 2022-04-10: 80 ug via INTRAVENOUS

## 2022-04-10 MED ORDER — POVIDONE-IODINE 10 % EX SWAB
2.0000 | Freq: Once | CUTANEOUS | Status: AC
  Administered 2022-04-10: 2 via TOPICAL

## 2022-04-10 MED ORDER — KETAMINE HCL 50 MG/5ML IJ SOSY
PREFILLED_SYRINGE | INTRAMUSCULAR | Status: AC
  Filled 2022-04-10: qty 5

## 2022-04-10 MED ORDER — SUGAMMADEX SODIUM 200 MG/2ML IV SOLN
INTRAVENOUS | Status: DC | PRN
  Administered 2022-04-10: 200 mg via INTRAVENOUS

## 2022-04-10 MED ORDER — PROPOFOL 10 MG/ML IV BOLUS
INTRAVENOUS | Status: DC | PRN
  Administered 2022-04-10: 80 mg via INTRAVENOUS
  Administered 2022-04-10: 20 mg via INTRAVENOUS

## 2022-04-10 MED ORDER — TRANEXAMIC ACID-NACL 1000-0.7 MG/100ML-% IV SOLN
1000.0000 mg | INTRAVENOUS | Status: AC
  Administered 2022-04-10: 1000 mg via INTRAVENOUS

## 2022-04-10 MED ORDER — HYDROMORPHONE HCL 1 MG/ML IJ SOLN
0.2500 mg | INTRAMUSCULAR | Status: DC | PRN
  Administered 2022-04-10 (×4): 0.5 mg via INTRAVENOUS

## 2022-04-10 MED ORDER — ONDANSETRON HCL 4 MG/2ML IJ SOLN
INTRAMUSCULAR | Status: AC
  Filled 2022-04-10: qty 6

## 2022-04-10 MED ORDER — PROPOFOL 10 MG/ML IV BOLUS
INTRAVENOUS | Status: AC
  Filled 2022-04-10: qty 20

## 2022-04-10 MED ORDER — ONDANSETRON HCL 4 MG/2ML IJ SOLN
4.0000 mg | Freq: Once | INTRAMUSCULAR | Status: AC
  Administered 2022-04-10: 4 mg via INTRAVENOUS

## 2022-04-10 MED ORDER — DEXAMETHASONE SODIUM PHOSPHATE 10 MG/ML IJ SOLN
INTRAMUSCULAR | Status: DC | PRN
  Administered 2022-04-10: 10 mg via INTRAVENOUS

## 2022-04-10 MED ORDER — MIDAZOLAM HCL 2 MG/2ML IJ SOLN
INTRAMUSCULAR | Status: DC | PRN
  Administered 2022-04-10 (×2): 1 mg via INTRAVENOUS

## 2022-04-10 MED ORDER — EPHEDRINE 5 MG/ML INJ
INTRAVENOUS | Status: AC
  Filled 2022-04-10: qty 5

## 2022-04-10 MED ORDER — ACETAMINOPHEN 500 MG PO TABS
ORAL_TABLET | ORAL | Status: AC
  Administered 2022-04-10: 1000 mg via ORAL
  Filled 2022-04-10: qty 2

## 2022-04-10 MED ORDER — MIDAZOLAM HCL 2 MG/2ML IJ SOLN
INTRAMUSCULAR | Status: AC
  Filled 2022-04-10: qty 2

## 2022-04-10 MED ORDER — LIDOCAINE 2% (20 MG/ML) 5 ML SYRINGE
INTRAMUSCULAR | Status: AC
  Filled 2022-04-10: qty 15

## 2022-04-10 MED ORDER — LACTATED RINGERS IV SOLN: INTRAVENOUS | Status: DC | PRN

## 2022-04-10 MED ORDER — ACETAMINOPHEN 500 MG PO TABS: 1000.0000 mg | ORAL_TABLET | Freq: Once | ORAL | Status: AC

## 2022-04-10 MED ORDER — LIDOCAINE 2% (20 MG/ML) 5 ML SYRINGE
INTRAMUSCULAR | Status: DC | PRN
  Administered 2022-04-10: 40 mg via INTRAVENOUS

## 2022-04-10 MED ORDER — 0.9 % SODIUM CHLORIDE (POUR BTL) OPTIME
TOPICAL | Status: DC | PRN
  Administered 2022-04-10: 1000 mL

## 2022-04-10 MED ORDER — VANCOMYCIN HCL 1000 MG IV SOLR
INTRAVENOUS | Status: DC | PRN
  Administered 2022-04-10: 1000 mg via TOPICAL

## 2022-04-10 MED ORDER — PHENYLEPHRINE 80 MCG/ML (10ML) SYRINGE FOR IV PUSH (FOR BLOOD PRESSURE SUPPORT)
PREFILLED_SYRINGE | INTRAVENOUS | Status: AC
  Filled 2022-04-10: qty 20

## 2022-04-10 MED ORDER — AMISULPRIDE (ANTIEMETIC) 5 MG/2ML IV SOLN: 10.0000 mg | Freq: Once | INTRAVENOUS | Status: DC | PRN

## 2022-04-10 MED ORDER — FENTANYL CITRATE (PF) 250 MCG/5ML IJ SOLN
INTRAMUSCULAR | Status: DC | PRN
  Administered 2022-04-10: 75 ug via INTRAVENOUS
  Administered 2022-04-10 (×2): 50 ug via INTRAVENOUS
  Administered 2022-04-10: 25 ug via INTRAVENOUS

## 2022-04-10 MED ORDER — TRANEXAMIC ACID-NACL 1000-0.7 MG/100ML-% IV SOLN
INTRAVENOUS | Status: AC
  Filled 2022-04-10: qty 100

## 2022-04-10 SURGICAL SUPPLY — 97 items
ADH SKN CLS APL DERMABOND .7 (GAUZE/BANDAGES/DRESSINGS) ×2
APL PRP STRL LF DISP 70% ISPRP (MISCELLANEOUS) ×2
BAG COUNTER SPONGE SURGICOUNT (BAG) ×3 IMPLANT
BAG SPNG CNTER NS LX DISP (BAG) ×2
BANDAGE ESMARK 6X9 LF (GAUZE/BANDAGES/DRESSINGS) ×3 IMPLANT
BIT DRILL CALIBR QC 2.8X250 (BIT) ×1 IMPLANT
BIT DRILL QC SFS 2.5X170 (BIT) ×1 IMPLANT
BLADE CLIPPER SURG (BLADE) IMPLANT
BLADE SURG 15 STRL LF DISP TIS (BLADE) ×3 IMPLANT
BLADE SURG 15 STRL SS (BLADE) ×2
BNDG CMPR 9X6 STRL LF SNTH (GAUZE/BANDAGES/DRESSINGS) ×2
BNDG CMPR MED 10X6 ELC LF (GAUZE/BANDAGES/DRESSINGS) ×2
BNDG COHESIVE 4X5 TAN STRL (GAUZE/BANDAGES/DRESSINGS) ×2 IMPLANT
BNDG ELASTIC 4X5.8 VLCR STR LF (GAUZE/BANDAGES/DRESSINGS) ×3 IMPLANT
BNDG ELASTIC 6X10 VLCR STRL LF (GAUZE/BANDAGES/DRESSINGS) ×1 IMPLANT
BNDG ELASTIC 6X5.8 VLCR STR LF (GAUZE/BANDAGES/DRESSINGS) ×2 IMPLANT
BNDG ESMARK 6X9 LF (GAUZE/BANDAGES/DRESSINGS) ×2
BNDG GAUZE DERMACEA FLUFF 4 (GAUZE/BANDAGES/DRESSINGS) ×4 IMPLANT
BNDG GZE DERMACEA 4 6PLY (GAUZE/BANDAGES/DRESSINGS)
BRUSH SCRUB EZ PLAIN DRY (MISCELLANEOUS) ×5 IMPLANT
CANISTER SUCT 3000ML PPV (MISCELLANEOUS) ×2 IMPLANT
CHLORAPREP W/TINT 26 (MISCELLANEOUS) ×5 IMPLANT
COVER SURGICAL LIGHT HANDLE (MISCELLANEOUS) ×5 IMPLANT
CUFF TOURN SGL QUICK 34 (TOURNIQUET CUFF) ×2
CUFF TRNQT CYL 34X4.125X (TOURNIQUET CUFF) ×3 IMPLANT
DERMABOND ADVANCED .7 DNX12 (GAUZE/BANDAGES/DRESSINGS) ×1 IMPLANT
DRAPE C-ARM 42X72 X-RAY (DRAPES) ×3 IMPLANT
DRAPE C-ARMOR (DRAPES) ×3 IMPLANT
DRAPE IMP U-DRAPE 54X76 (DRAPES) ×4 IMPLANT
DRAPE ORTHO SPLIT 77X108 STRL (DRAPES) ×4
DRAPE SURG ORHT 6 SPLT 77X108 (DRAPES) ×6 IMPLANT
DRAPE U-SHAPE 47X51 STRL (DRAPES) ×3 IMPLANT
DRESSING MEPILEX FLEX 4X4 (GAUZE/BANDAGES/DRESSINGS) ×1 IMPLANT
DRSG MEPILEX FLEX 4X4 (GAUZE/BANDAGES/DRESSINGS) ×2
DRSG MEPILEX POST OP 4X8 (GAUZE/BANDAGES/DRESSINGS) ×1 IMPLANT
DRSG MEPITEL 4X7.2 (GAUZE/BANDAGES/DRESSINGS) IMPLANT
ELECT REM PT RETURN 9FT ADLT (ELECTROSURGICAL) ×2
ELECTRODE REM PT RTRN 9FT ADLT (ELECTROSURGICAL) ×3 IMPLANT
GAUZE PAD ABD 8X10 STRL (GAUZE/BANDAGES/DRESSINGS) ×4 IMPLANT
GAUZE SPONGE 4X4 12PLY STRL (GAUZE/BANDAGES/DRESSINGS) ×3 IMPLANT
GLOVE BIO SURGEON STRL SZ 6.5 (GLOVE) ×9 IMPLANT
GLOVE BIO SURGEON STRL SZ7.5 (GLOVE) ×12 IMPLANT
GLOVE BIOGEL PI IND STRL 6.5 (GLOVE) ×3 IMPLANT
GLOVE BIOGEL PI IND STRL 7.5 (GLOVE) ×3 IMPLANT
GOWN STRL REUS W/ TWL LRG LVL3 (GOWN DISPOSABLE) ×6 IMPLANT
GOWN STRL REUS W/TWL LRG LVL3 (GOWN DISPOSABLE) ×4
IMMOBILIZER KNEE 22 UNIV (SOFTGOODS) ×2 IMPLANT
K-WIRE 1.6X150 (WIRE) ×2
KIT BASIN OR (CUSTOM PROCEDURE TRAY) ×3 IMPLANT
KIT TURNOVER KIT B (KITS) ×3 IMPLANT
KWIRE 1.6X150 (WIRE) ×1 IMPLANT
MANIFOLD NEPTUNE II (INSTRUMENTS) ×3 IMPLANT
NDL 22X1.5 STRL (OR ONLY) (MISCELLANEOUS) IMPLANT
NDL SUT 6 .5 CRC .975X.05 MAYO (NEEDLE) ×3 IMPLANT
NEEDLE 22X1.5 STRL (OR ONLY) (MISCELLANEOUS) IMPLANT
NEEDLE MAYO TAPER (NEEDLE) ×2
NS IRRIG 1000ML POUR BTL (IV SOLUTION) ×3 IMPLANT
PACK ORTHO EXTREMITY (CUSTOM PROCEDURE TRAY) ×3 IMPLANT
PACK TOTAL JOINT (CUSTOM PROCEDURE TRAY) ×3 IMPLANT
PAD ARMBOARD 7.5X6 YLW CONV (MISCELLANEOUS) ×6 IMPLANT
PAD CAST 4YDX4 CTTN HI CHSV (CAST SUPPLIES) ×2 IMPLANT
PADDING CAST ABS COTTON 4X4 ST (CAST SUPPLIES) ×1 IMPLANT
PADDING CAST COTTON 4X4 STRL (CAST SUPPLIES)
PADDING CAST COTTON 6X4 STRL (CAST SUPPLIES) ×5 IMPLANT
PLATE PROX TIBIA LEFT (Plate) ×1 IMPLANT
SCREW CORTEX 3.5X75MM (Screw) ×1 IMPLANT
SCREW LOCK CORT ST 3.5X28 (Screw) ×1 IMPLANT
SCREW LOCK CORT ST 3.5X30 (Screw) ×1 IMPLANT
SCREW LOCK CORT ST 3.5X38 (Screw) ×1 IMPLANT
SCREW LOCKING 3.5X70MM VA (Screw) ×3 IMPLANT
SCREW LOCKING VA 3.5X50MM (Screw) ×1 IMPLANT
SCREW VA-LOCKING 65MM 3.5 (Screw) ×1 IMPLANT
SPONGE T-LAP 18X18 ~~LOC~~+RFID (SPONGE) ×3 IMPLANT
STAPLER VISISTAT 35W (STAPLE) ×2 IMPLANT
STRIP CLOSURE SKIN 1/2X4 (GAUZE/BANDAGES/DRESSINGS) IMPLANT
SUCTION FRAZIER HANDLE 10FR (MISCELLANEOUS) ×2
SUCTION TUBE FRAZIER 10FR DISP (MISCELLANEOUS) ×3 IMPLANT
SUT ETHILON 2 0 FS 18 (SUTURE) ×3 IMPLANT
SUT ETHILON 3 0 PS 1 (SUTURE) IMPLANT
SUT FIBERWIRE #2 38 T-5 BLUE (SUTURE)
SUT MNCRL AB 3-0 PS2 27 (SUTURE) ×1 IMPLANT
SUT PROLENE 0 CT (SUTURE) IMPLANT
SUT VIC AB 0 CT1 27 (SUTURE)
SUT VIC AB 0 CT1 27XBRD ANBCTR (SUTURE) IMPLANT
SUT VIC AB 1 CT1 18XCR BRD 8 (SUTURE) IMPLANT
SUT VIC AB 1 CT1 27 (SUTURE) ×2
SUT VIC AB 1 CT1 27XBRD ANBCTR (SUTURE) ×3 IMPLANT
SUT VIC AB 1 CT1 8-18 (SUTURE)
SUT VIC AB 1 CTX 18 (SUTURE) ×1 IMPLANT
SUT VIC AB 2-0 CT1 27 (SUTURE) ×4
SUT VIC AB 2-0 CT1 TAPERPNT 27 (SUTURE) ×6 IMPLANT
SUTURE FIBERWR #2 38 T-5 BLUE (SUTURE) IMPLANT
TOWEL GREEN STERILE (TOWEL DISPOSABLE) ×6 IMPLANT
TOWEL GREEN STERILE FF (TOWEL DISPOSABLE) ×6 IMPLANT
TRAY FOLEY MTR SLVR 16FR STAT (SET/KITS/TRAYS/PACK) IMPLANT
UNDERPAD 30X36 HEAVY ABSORB (UNDERPADS AND DIAPERS) ×3 IMPLANT
WATER STERILE IRR 1000ML POUR (IV SOLUTION) ×5 IMPLANT

## 2022-04-10 NOTE — Progress Notes (Signed)
RT went to assess patient and the patient stated that she would be ok for the night without CPAP and that her family would bring her personal machine and mask.  RT will continue to monitor.

## 2022-04-10 NOTE — Op Note (Signed)
Orthopaedic Surgery Operative Note (CSN: [id number] ) Date of Surgery: 04/10/2022  Admit Date: 04/09/2022   Diagnoses: Pre-Op Diagnoses: Left bicondylar tibial plateau fracutre  Post-Op Diagnosis: Same  Procedures: CPT 40102-VOZD reduction internal fixation of left tibial plateau fracture  Surgeons : Primary: Shona Needles, MD  Assistant: Patrecia Pace, PA-C  Location: OR 3   Anesthesia: General   Antibiotics: Ancef 2g preop with 1 gm vancomycin powder placed topically   Tourniquet time:  Total Tourniquet Time Documented: Thigh (Left) - 61 minutes Total: Thigh (Left) - 61 minutes  Estimated Blood Loss: 25 mL  Complications:None  Specimens:None   Implants: Implant Name Type Inv. Item Serial No. Manufacturer Lot No. LRB No. Used Action  SCREW LOCK CORT ST 3.5X28 - [id number] Screw SCREW LOCK CORT ST 3.5X28  [employer]  Left 1 Implanted  SCREW LOCK CORT ST 3.5X32 - [id number] Screw SCREW LOCK CORT ST 3.5X32  [employer]  Left 1 Implanted  SCREW CORTEX 3.5X75MM - [id number] Screw SCREW CORTEX 3.5X75MM  [employer]  Left 1 Implanted  SCREW LOCK CORT ST 3.5X38 - [id number] Screw SCREW LOCK CORT ST 3.5X38  [employer]  Left 1 Implanted  PLATE PROX TIBIA LEFT - [id number] Plate PLATE PROX TIBIA LEFT  [employer]  Left 1 Implanted  SCREW LOCKING 3.5X70MM VA - [id number] Screw SCREW LOCKING 3.5X70MM VA  [employer]  Left 2 Implanted  SCREW VA-LOCKING 65MM 3.5 - [id number] Screw SCREW VA-LOCKING 65MM 3.5  [employer]  Left 1 Implanted  SCREW LOCKING VA 3.5X50MM - [id number] Screw SCREW LOCKING VA 3.5X50MM  [employer]  Left 1 Implanted     Indications for Surgery: 67 year old female who sustained sustained a left bicondylar tibial plateau fracture.  Due to the unstable nature of her injury I recommend proceeding with open reduction internal fixation.  Risks and benefits were discussed with the patient.  Risks included but  not limited to bleeding, infection, malunion, nonunion, hardware failure, hardware irritation, nerve or blood vessel injury, knee stiffness, posttraumatic arthritis, DVT, even the possibility anesthetic complications.  She agreed to proceed with surgery and consent was obtained.  Operative Findings: Open reduction internal fixation of left bicondylar tibial plateau fracture using Synthes VA 3.5 mm proximal tibial locking plate  Procedure: The patient was identified in the preoperative holding area. Consent was confirmed with the patient and their family and all questions were answered. The operative extremity was marked after confirmation with the patient. she was then brought back to the operating room by our anesthesia colleagues.  She was placed under general anesthetic and carefully transferred over to radiolucent flattop table.  A bump was placed under her left hip and a nonsterile tourniquet was then placed to her thigh.  The left lower extremity was then prepped and draped in usual sterile fashion.  A timeout was performed to verify the patient, the procedure, and the extremity.  Preoperative antibiotics were dosed.  The hip and knee were flexed over a triangle.  Fluoroscopic imaging was obtained to show the unstable nature of the injury.  The tourniquet was inflated to 300 mmHg.  Total tourniquet time as noted above.  A curvilinear incision along the lateral aspect of the proximal tibia was made and carried down through skin and subcutaneous tissue.  I incised through the IT band and developed the plane between the capsule and the IT band proximally and released the IT band off for the lateral tibial condyle distally.  I then performed a submeniscal arthrotomy to  visualize the articular surface of the meniscus.  I tagged the capsule and meniscus with a 0 Vicryl suture.  There is no meniscus tear to be repaired.  At this point I worked on provisional fixation of the joint.  Most of the impaction and  displacement of the joint was posteriorly.  There is not an anterior lateral split.  As result I created a cortical window in the anterior lateral aspect of the tibia and used a tamp to elevate the posterior aspect of the joint.  I was able to visualize this through the arthrotomy as well as fluoroscopy.  Once I had adequate reduction of the posterior joint I then held it provisionally with K wires.  Once I had the lateral joint provisionally stabilized and I turned my send tension to the posterior medial fragment.  This was relatively nondisplaced but I did make percutaneous incisions to place a clamp to hold the reduction as I proceeded to place a K wire from anterior to posterior.  With the clamp and K wires in place I then proceeded to place a Synthes 3.5 mm VA proximal tibial locking plate.  I held provisionally with a K wire and confirmed location with fluoroscopy.  I then placed a nonlocking screw proximally to bring the plate flush to bone.  I then percutaneously placed 3.5 millimeter screws into the tibial shaft to bring the distal portion of the plate flush to bone.  I then proceeded to place locking screws in the proximal segment to graft the disimpacted articular surface.  I then placed a kickstand screw to provide some medial column support.  Final fluoroscopic imaging was then obtained.  The incision was copiously irrigated.  A free needle was used to repair the capsule back down to the plate.  A gram of vancomycin powder was placed into the incision.  The IT band was closed with #1 Vicryl suture.  The skin was closed with 2-0 Vicryl and 3-0 Monocryl.  Sterile dressings were applied to the knee.  The patient was then awoke from anesthesia and taken to the PACU in stable condition.  Post Op Plan/Instructions: The patient be nonweightbearing to the left lower extremity.  She will receive postoperative Ancef.  She will receive Lovenox for DVT prophylaxis and discharged on aspirin.  We will have her  mobilize with physical and Occupational Therapy.  I was present and performed the entire surgery.  Patrecia Pace, PA-C did assist me throughout the case. An assistant was necessary given the difficulty in approach, maintenance of reduction and ability to instrument the fracture.   Katha Hamming, MD [facility]

## 2022-04-10 NOTE — Progress Notes (Signed)
  Writer unable to contain consent for surgery due to patient not understanding what procedure they will be having and patient reports they do not remember being talked to about procedure.

## 2022-04-10 NOTE — Transfer of Care (Signed)
Immediate Anesthesia Transfer of Care Note  Patient: Rachael Jordan  Procedure(s) Performed: OPEN REDUCTION INTERNAL FIXATION (ORIF) TIBIAL PLATEAU VERSUS (Left: Leg Lower)  Patient Location: PACU  Anesthesia Type:General  Level of Consciousness: drowsy and patient cooperative  Airway & Oxygen Therapy: Patient Spontanous Breathing  Post-op Assessment: Report given to RN, Post -op Vital signs reviewed and stable, and Patient moving all extremities X 4  Post vital signs: Reviewed and stable  Last Vitals:  Vitals Value Taken Time  BP 124/76 04/10/22 1218  Temp    Pulse 103 04/10/22 1221  Resp 20 04/10/22 1221  SpO2 93 % 04/10/22 1221  Vitals shown include unvalidated device data.  Last Pain:  Vitals:   04/10/22 0820  TempSrc: Oral  PainSc: 3          Complications: No notable events documented.

## 2022-04-10 NOTE — Progress Notes (Signed)
Initial Nutrition Assessment  DOCUMENTATION CODES:   Not applicable  INTERVENTION:  - Add Ensure Max po BID, each supplement provides 150 kcal and 30 grams of protein. (Once diet is adv)  - Add MVI q day.   NUTRITION DIAGNOSIS:   Increased nutrient needs related to hip fracture as evidenced by estimated needs.  GOAL:   Patient will meet greater than or equal to 90% of their needs  MONITOR:   PO intake, Diet advancement  REASON FOR ASSESSMENT:   Consult Hip fracture protocol  ASSESSMENT:   67 y.o. female admits related to fall. PMH includes: chronic pain, HTN. Pt is currently receiving medical management related to tibial plateau fracture.  Meds reviewed: lipitor, colace. Labs reviewed: Na low, K low.   The pt was out of room in surgery at time of assessment. Pt's sister and mother were in room. They report that the pt was eating well PTA. The pt has increased nutrient needs related to healing of fracture. RD will add Ensure Max BID once diet is advanced, for added protein.   NUTRITION - FOCUSED PHYSICAL EXAM:  Unable to assess,   Diet Order:   Diet Order             Diet NPO time specified Except for: Sips with Meds  Diet effective midnight                   EDUCATION NEEDS:   Not appropriate for education at this time  Skin:  Skin Assessment: Reviewed RN Assessment  Last BM:  04/08/22  Height:   Ht Readings from Last 1 Encounters:  04/10/22 '5\' 5"'$  (1.651 m)    Weight:   Wt Readings from Last 1 Encounters:  04/10/22 70.8 kg    Ideal Body Weight:     BMI:  Body mass index is 25.96 kg/m.  Estimated Nutritional Needs:   Kcal:  1443-1540 kcals  Protein:  105-125 gm  Fluid:  >/= 2.1 L  Thalia Bloodgood, RD, LDN, CNSC.

## 2022-04-10 NOTE — Anesthesia Procedure Notes (Signed)
Procedure Name: Intubation Date/Time: 04/10/2022 10:55 AM  Performed by: Darletta Moll, CRNAPre-anesthesia Checklist: Patient identified, Emergency Drugs available, Suction available and Patient being monitored Patient Re-evaluated:Patient Re-evaluated prior to induction Oxygen Delivery Method: Circle system utilized Preoxygenation: Pre-oxygenation with 100% oxygen Induction Type: IV induction Ventilation: Mask ventilation without difficulty Laryngoscope Size: Mac and 3 Grade View: Grade I Tube type: Oral Tube size: 7.0 mm Number of attempts: 1 Airway Equipment and Method: Stylet and Oral airway Placement Confirmation: ETT inserted through vocal cords under direct vision, positive ETCO2 and breath sounds checked- equal and bilateral Secured at: 21 cm Tube secured with: Tape Dental Injury: Teeth and Oropharynx as per pre-operative assessment

## 2022-04-10 NOTE — Progress Notes (Addendum)
PROGRESS NOTE    Rachael Jordan  WUJ:[id number] DOB: [birth year] DOA: 04/09/2022 PCP: Rayetta Humphrey, MD   Brief Narrative:  67 y.o. female with medical history significant of chronic pain and HTN presented with a mechanical fall.  She was found to have left closed comminuted bicondylar tibial plateau fracture and admitted to [facility].  She was subsequently transferred to [facility] for orthopedics intervention.  Assessment & Plan:   Left closed comminuted bicondylar tibial plateau fracture after a mechanical fall -Orthopedics following and planning for surgical intervention today.  NPO.  Continue pain management.  Fall precautions.  Chronic pain -Continue current pain management.  Outpatient follow-up with pain management -Continue nortriptyline  Leukocytosis -Possibly reactive.  Improving  Thrombocytosis -reactive.  Resolved  Normocytic anemia -Questionable cause.  Hemoglobin stable.  Hyponatremia -mild.  Monitor  Hypokalemia -Mild.  Repeat a.m. labs  Essential hypertension -Continue amlodipine  Hyperlipidemia--continue statin  OSA on CPAP -Continue CPAP  Asthma--stable.  Continue albuterol as needed  History of urinary incontinence -Continue Myrbetriq    DVT prophylaxis: SCDs Code Status: Full Family Communication: None at bedside Disposition Plan: Status is: Inpatient Remains inpatient appropriate because: Of need for surgical intervention  Consultants: Orthopedics  Procedures: None  Antimicrobials: None   Subjective: Patient seen and examined at bedside.  No fever, chest pain, shortness of breath reported.  Complains of intermittent left knee pain.  Objective: Vitals:   04/09/22 2004 04/10/22 0608 04/10/22 0801 04/10/22 0820  BP: 125/72 105/70 113/63 117/68  Pulse: 90 96 99 93  Resp: 16 16 18 20   Temp: 98 F (36.7 C) (!) 97.1 F (36.2 C) 98.1 F (36.7 C) 98.7 F (37.1 C)  TempSrc: Oral  Oral Oral  SpO2: 98% 99% 100% 98%   Weight:    70.8 kg  Height:    5\' 5"  (1.651 m)   No intake or output data in the 24 hours ending 04/10/22 1000 Filed Weights   04/09/22 1610 04/10/22 0820  Weight: 70.8 kg 70.8 kg    Examination:  General exam: Appears calm and comfortable.  On room air. Respiratory system: Bilateral decreased breath sounds at bases Cardiovascular system: S1 & S2 heard, Rate controlled Gastrointestinal system: Abdomen is nondistended, soft and nontender. Normal bowel sounds heard. Extremities: No cyanosis, clubbing, edema.  Left knee is in an immobilizer Central nervous system: Alert and oriented. No focal neurological deficits. Moving extremities Skin: No rashes, lesions or ulcers Psychiatry: Judgement and insight appear normal. Mood & affect appropriate.     Data Reviewed: I have personally reviewed following labs and imaging studies  CBC: Recent Labs  Lab 04/08/22 2010 04/10/22 0712  WBC 18.3* 14.2*  NEUTROABS 14.7*  --   HGB 11.4* 11.9*  HCT 34.1* 37.6  MCV 89.0 94.2  PLT 424* 394   Basic Metabolic Panel: Recent Labs  Lab 04/08/22 2010 04/10/22 0712  NA 137 134*  K 3.0* 3.3*  CL 105 97*  CO2 20* 23  GLUCOSE 118* 111*  BUN 10 16  CREATININE 0.73 1.12*  CALCIUM 9.3 8.7*   GFR: Estimated Creatinine Clearance: 50.1 mL/min (A) (by C-G formula based on SCr of 1.12 mg/dL (H)). Liver Function Tests: No results for input(s): "AST", "ALT", "ALKPHOS", "BILITOT", "PROT", "ALBUMIN" in the last 168 hours. No results for input(s): "LIPASE", "AMYLASE" in the last 168 hours. No results for input(s): "AMMONIA" in the last 168 hours. Coagulation Profile: No results for input(s): "INR", "PROTIME" in the last 168 hours. Cardiac Enzymes: No results  for input(s): "CKTOTAL", "CKMB", "CKMBINDEX", "TROPONINI" in the last 168 hours. BNP (last 3 results) No results for input(s): "PROBNP" in the last 8760 hours. HbA1C: No results for input(s): "HGBA1C" in the last 72 hours. CBG: No results  for input(s): "GLUCAP" in the last 168 hours. Lipid Profile: No results for input(s): "CHOL", "HDL", "LDLCALC", "TRIG", "CHOLHDL", "LDLDIRECT" in the last 72 hours. Thyroid Function Tests: No results for input(s): "TSH", "T4TOTAL", "FREET4", "T3FREE", "THYROIDAB" in the last 72 hours. Anemia Panel: No results for input(s): "VITAMINB12", "FOLATE", "FERRITIN", "TIBC", "IRON", "RETICCTPCT" in the last 72 hours. Sepsis Labs: No results for input(s): "PROCALCITON", "LATICACIDVEN" in the last 168 hours.  Recent Results (from the past 240 hour(s))  Surgical pcr screen     Status: Abnormal   Collection Time: 04/09/22 10:34 PM   Specimen: Nasal Mucosa; Nasal Swab  Result Value Ref Range Status   MRSA, PCR NEGATIVE NEGATIVE Final   Staphylococcus aureus POSITIVE (A) NEGATIVE Final    Comment: (NOTE) The Xpert SA Assay (FDA approved for NASAL specimens in patients 16 years of age and older), is one component of a comprehensive surveillance program. It is not intended to diagnose infection nor to guide or monitor treatment. Performed at [facility], [address], [place], [place] [address]          Radiology Studies: CT Hip Left Wo Contrast  Result Date: 04/08/2022 CLINICAL DATA:  Trauma, fall EXAM: CT OF THE LEFT HIP WITHOUT CONTRAST TECHNIQUE: Multidetector CT imaging of the left hip was performed according to the standard protocol. Multiplanar CT image reconstructions were also generated. RADIATION DOSE REDUCTION: This exam was performed according to the departmental dose-optimization program which includes automated exposure control, adjustment of the mA and/or kV according to patient size and/or use of iterative reconstruction technique. COMPARISON:  None Available. FINDINGS: Bones/Joint/Cartilage No fracture or malalignment. Left SI joint is non widened. Pubic symphysis and rami appear intact. No significant hip effusion. The joint space appears patent. Ligaments Suboptimally  assessed by CT. Muscles and Tendons No significant atrophy.  No intramuscular fluid collections Soft tissues Negative IMPRESSION: No CT evidence for acute osseous abnormality of the left hip. Electronically Signed   By: Jasmine Pang M.D.   On: 04/08/2022 18:59   CT Knee Left Wo Contrast  Result Date: 04/08/2022 CLINICAL DATA:  Knee injury EXAM: CT OF THE LEFT KNEE WITHOUT CONTRAST TECHNIQUE: Multidetector CT imaging of the left knee was performed according to the standard protocol. Multiplanar CT image reconstructions were also generated. RADIATION DOSE REDUCTION: This exam was performed according to the departmental dose-optimization program which includes automated exposure control, adjustment of the mA and/or kV according to patient size and/or use of iterative reconstruction technique. COMPARISON:  Radiograph 04/08/2022. FINDINGS: Bones/Joint/Cartilage Acute comminuted intra-articular proximal tibial fracture for involves both the medial and lateral tibial plateaus with fracture lucency extending through the tibial spines. About 5 mm depression of the lateral tibial plateau fracture. About 2 mm depression of the medial tibial plateau. Nondisplaced cortical fracture lucency at the posterior to posterolateral cortex of tibia extends to at least the proximal shaft, incomplete visualization of distal extent. No definitive fibular or patellar fracture. Large lipohemarthrosis. Ligaments Suboptimally assessed by CT. Muscles and Tendons No significant atrophy. No intramuscular fluid collections. Patellar and quadriceps tendons appear grossly intact. Soft tissues Soft tissue swelling about the knee. Punctate focus of gas within the soft tissues lateral side of the knee at the level of patella, no obvious open wound in  the vicinity. IMPRESSION: 1. Acute comminuted intra-articular proximal tibial fracture involves both the medial and lateral tibial plateaus with extension of lucency through the tibial spines. Lateral  greater than medial depression of fracture fragments as above. Nondisplaced fracture lucency is visualized at the posterior to posterolateral cortex of the tibia to the level of the proximal shaft but the inferior extent is incompletely visualized. There is associated large lipohemarthrosis. Electronically Signed   By: Jasmine Pang M.D.   On: 04/08/2022 18:57   DG Knee Left Port  Result Date: 04/08/2022 CLINICAL DATA:  Fall EXAM: PORTABLE LEFT KNEE - 1-2 VIEW COMPARISON:  None Available. FINDINGS: There is a comminuted fracture of the lateral tibial plateau. This extends into the tibial spines in his depressed proximally 1 cm. A nondisplaced oblique fracture fragment is seen through the proximal lateral diaphysis of the tibia. There is 6 mm of posterior offset of fracture fragments. There is no evidence for dislocation. Lipohemarthrosis is present. IMPRESSION: 1. Comminuted fracture of the lateral tibial plateau with extension into the tibial spines and proximal diaphysis. 2. Lipohemarthrosis. Electronically Signed   By: Darliss Cheney M.D.   On: 04/08/2022 17:33        Scheduled Meds:  [MAR Hold] amLODipine  5 mg Oral Daily   [MAR Hold] aspirin EC  81 mg Oral Daily   [MAR Hold] atorvastatin  20 mg Oral Daily   [MAR Hold] azelastine  1-2 spray Each Nare BID   chlorhexidine  60 mL Topical Once   [MAR Hold] chlorhexidine  15 mL Mouth/Throat NOW   [MAR Hold] docusate sodium  100 mg Oral BID   [MAR Hold] loratadine  10 mg Oral Daily   [MAR Hold] mirabegron ER  50 mg Oral Daily   [MAR Hold] nortriptyline  10 mg Oral BID   [MAR Hold] pantoprazole  40 mg Oral Daily   [MAR Hold] traZODone  100 mg Oral QHS   Continuous Infusions:   ceFAZolin (ANCEF) IV     [MAR Hold] methocarbamol (ROBAXIN) IV     tranexamic acid            Glade Lloyd, MD [facility] 04/10/2022, 10:00 AM

## 2022-04-10 NOTE — Interval H&P Note (Signed)
History and Physical Interval Note:  04/10/2022 9:13 AM  Rachael Jordan  has presented today for surgery, with the diagnosis of Left tibial plateau fracture.  The various methods of treatment have been discussed with the patient and family. After consideration of risks, benefits and other options for treatment, the patient has consented to  Procedure(s): OPEN REDUCTION INTERNAL FIXATION (ORIF) TIBIAL PLATEAU VERSUS (Left) EXTERNAL FIXATION LEG (Left) as a surgical intervention.  The patient's history has been reviewed, patient examined, no change in status, stable for surgery.  I have reviewed the patient's chart and labs.  Questions were answered to the patient's satisfaction.     Lennette Bihari P Dinisha Cai

## 2022-04-11 ENCOUNTER — Encounter (HOSPITAL_COMMUNITY): Payer: Self-pay | Admitting: Student

## 2022-04-11 DIAGNOSIS — G894 Chronic pain syndrome: Secondary | ICD-10-CM

## 2022-04-11 DIAGNOSIS — S82142A Displaced bicondylar fracture of left tibia, initial encounter for closed fracture: Secondary | ICD-10-CM | POA: Diagnosis not present

## 2022-04-11 DIAGNOSIS — G4733 Obstructive sleep apnea (adult) (pediatric): Secondary | ICD-10-CM

## 2022-04-11 DIAGNOSIS — I10 Essential (primary) hypertension: Secondary | ICD-10-CM

## 2022-04-11 LAB — CBC WITH DIFFERENTIAL/PLATELET
Abs Immature Granulocytes: 0.08 10*3/uL — ABNORMAL HIGH (ref 0.00–0.07)
Basophils Absolute: 0 10*3/uL (ref 0.0–0.1)
Basophils Relative: 0 %
Eosinophils Absolute: 0 10*3/uL (ref 0.0–0.5)
Eosinophils Relative: 0 %
HCT: 32.4 % — ABNORMAL LOW (ref 36.0–46.0)
Hemoglobin: 10.6 g/dL — ABNORMAL LOW (ref 12.0–15.0)
Immature Granulocytes: 1 %
Lymphocytes Relative: 12 %
Lymphs Abs: 1.9 10*3/uL (ref 0.7–4.0)
MCH: 30.4 pg (ref 26.0–34.0)
MCHC: 32.7 g/dL (ref 30.0–36.0)
MCV: 92.8 fL (ref 80.0–100.0)
Monocytes Absolute: 1.7 10*3/uL — ABNORMAL HIGH (ref 0.1–1.0)
Monocytes Relative: 10 %
Neutro Abs: 13 10*3/uL — ABNORMAL HIGH (ref 1.7–7.7)
Neutrophils Relative %: 77 %
Platelets: 303 10*3/uL (ref 150–400)
RBC: 3.49 MIL/uL — ABNORMAL LOW (ref 3.87–5.11)
RDW: 13.3 % (ref 11.5–15.5)
WBC: 16.8 10*3/uL — ABNORMAL HIGH (ref 4.0–10.5)
nRBC: 0 % (ref 0.0–0.2)

## 2022-04-11 LAB — BASIC METABOLIC PANEL
Anion gap: 11 (ref 5–15)
BUN: 11 mg/dL (ref 8–23)
CO2: 25 mmol/L (ref 22–32)
Calcium: 8.6 mg/dL — ABNORMAL LOW (ref 8.9–10.3)
Chloride: 102 mmol/L (ref 98–111)
Creatinine, Ser: 0.91 mg/dL (ref 0.44–1.00)
GFR, Estimated: >60 mL/min (ref >60)
Glucose, Bld: 112 mg/dL — ABNORMAL HIGH (ref 70–99)
Potassium: 4 mmol/L (ref 3.5–5.1)
Sodium: 138 mmol/L (ref 135–145)

## 2022-04-11 LAB — MAGNESIUM: Magnesium: 2.3 mg/dL (ref 1.7–2.4)

## 2022-04-11 MED ORDER — ACETAMINOPHEN 500 MG PO TABS
1000.0000 mg | ORAL_TABLET | Freq: Four times a day (QID) | ORAL | Status: DC
  Administered 2022-04-11 – 2022-04-15 (×15): 1000 mg via ORAL
  Filled 2022-04-11 (×16): qty 2

## 2022-04-11 MED ORDER — ENSURE MAX PROTEIN PO LIQD
11.0000 [oz_av] | Freq: Two times a day (BID) | ORAL | Status: DC
  Administered 2022-04-11 – 2022-04-15 (×3): 11 [oz_av] via ORAL
  Filled 2022-04-11 (×9): qty 330

## 2022-04-11 MED ORDER — OXYCODONE HCL 5 MG PO TABS
15.0000 mg | ORAL_TABLET | Freq: Four times a day (QID) | ORAL | Status: DC | PRN
  Administered 2022-04-11 – 2022-04-15 (×11): 15 mg via ORAL
  Filled 2022-04-11 (×11): qty 3

## 2022-04-11 MED ORDER — TIZANIDINE HCL 4 MG PO TABS
4.0000 mg | ORAL_TABLET | Freq: Three times a day (TID) | ORAL | Status: DC | PRN
  Administered 2022-04-11 – 2022-04-13 (×5): 4 mg via ORAL
  Filled 2022-04-11 (×5): qty 1

## 2022-04-11 MED ORDER — GABAPENTIN 400 MG PO CAPS
800.0000 mg | ORAL_CAPSULE | Freq: Three times a day (TID) | ORAL | Status: DC
  Administered 2022-04-11 – 2022-04-12 (×3): 800 mg via ORAL
  Filled 2022-04-11 (×3): qty 2

## 2022-04-11 MED ORDER — ADULT MULTIVITAMIN W/MINERALS CH
1.0000 | ORAL_TABLET | Freq: Every day | ORAL | Status: DC
  Administered 2022-04-11 – 2022-04-15 (×5): 1 via ORAL
  Filled 2022-04-11 (×5): qty 1

## 2022-04-11 NOTE — Evaluation (Signed)
Physical Therapy Evaluation Patient Details Name: Shanvi Moyd MRN: [id number] DOB: 04-13-1955 Today's Date: 04/11/2022  History of Present Illness  67 yo female admitted 1/22 after falling over baby gate and landing on LLE with tibial plateau fx. 1/24 ORIF. PMhx: anxiety, asthma, CVA, scoliosis  Clinical Impression  Pt pleasant and able to perform limited gait and transfers adhering to NWB status. Pt educated for transfers,  RW use, goals and progression. Pt is married but has been separated for 47 years and lives with sister. Pt has 2 story home with bed/bath upstairs but may go to mom's house which is single story with 3 stairs. Pt limited by fatigue and pain. Pt with decreased strength, transfers, gait and function who will benefit from acute therapy to maximize mobility and safety to decrease burden of care.        Recommendations for follow up therapy are one component of a multi-disciplinary discharge planning process, led by the attending physician.  Recommendations may be updated based on patient status, additional functional criteria and insurance authorization.  Follow Up Recommendations Home health PT      Assistance Recommended at Discharge Intermittent Supervision/Assistance  Patient can return home with the following  A little help with walking and/or transfers;A little help with bathing/dressing/bathroom;Assist for transportation;Assistance with cooking/housework    Equipment Recommendations Rolling walker (2 wheels);BSC/3in1  Recommendations for Other Services       Functional Status Assessment Patient has had a recent decline in their functional status and demonstrates the ability to make significant improvements in function in a reasonable and predictable amount of time.     Precautions / Restrictions Precautions Precautions: Fall Restrictions Weight Bearing Restrictions: Yes LLE Weight Bearing: Non weight bearing      Mobility  Bed Mobility Overal bed  mobility: Needs Assistance Bed Mobility: Supine to Sit     Supine to sit: Min assist, HOB elevated     General bed mobility comments: HOB 30 degrees, assist to move LLE toward EOB with increased time, rail and cues for sequence    Transfers Overall transfer level: Needs assistance   Transfers: Sit to/from Stand Sit to Stand: Min assist           General transfer comment: min assist to rise from bed and minguard to rise from [facility] with pt maintaining NWB LLE    Ambulation/Gait Ambulation/Gait assistance: Min guard Gait Distance (Feet): 15 Feet Assistive device: Rolling walker (2 wheels) Gait Pattern/deviations: Step-to pattern   Gait velocity interpretation: <1.8 ft/sec, indicate of risk for recurrent falls   General Gait Details: pt with step to pattern, maintaining NWB limited by Rt thigh cramping. Pt with cues for sequence and safety. PT walked 57' then 5' with seated rest  Stairs            Wheelchair Mobility    Modified Rankin (Stroke Patients Only)       Balance Overall balance assessment: Needs assistance   Sitting balance-Leahy Scale: Fair     Standing balance support: Bilateral upper extremity supported, Reliant on assistive device for balance Standing balance-Leahy Scale: Poor Standing balance comment: reliant on RW for NWB status                             Pertinent Vitals/Pain Pain Assessment Pain Assessment: 0-10 Pain Score: 8  Pain Location: right thigh cramping and LLE Pain Descriptors / Indicators: Grimacing, Aching, Cramping Pain Intervention(s): Limited activity within patient's tolerance, Repositioned,  Monitored during session, Premedicated before session    [place] expects to be discharged to:: Private residence Living Arrangements: Spouse/significant other Available Help at Discharge: Family;Available 24 hours/day Type of Home: House Home Access: Stairs to enter   [employer] of  Steps: 2   Home Layout: Two level;Bed/bath upstairs;1/2 bath on main level Home Equipment: Cane - single point;Crutches Additional Comments: lives with sister and nieces, spouse present during eval    Prior Function Prior Level of Function : Independent/Modified Independent;Driving;Working/employed                     Hand Dominance        Extremity/Trunk Assessment   Upper Extremity Assessment Upper Extremity Assessment: Overall WFL for tasks assessed    Lower Extremity Assessment Lower Extremity Assessment: LLE deficits/detail LLE Deficits / Details: limited by pain       Communication   Communication: No difficulties  Cognition Arousal/Alertness: Awake/alert Behavior During Therapy: Flat affect Overall Cognitive Status: Within Functional Limits for tasks assessed                                          General Comments      Exercises     Assessment/Plan    PT Assessment Patient needs continued PT services  PT Problem List Decreased strength;Decreased mobility;Decreased safety awareness;Decreased activity tolerance;Decreased range of motion;Pain;Decreased balance;Decreased knowledge of use of DME       PT Treatment Interventions DME instruction;Therapeutic activities;Gait training;Therapeutic exercise;Stair training;Functional mobility training;Balance training;Patient/family education    PT Goals (Current goals can be found in the Care Plan section)  Acute Rehab PT Goals Patient Stated Goal: return home PT Goal Formulation: With patient Time For Goal Achievement: 04/18/22 Potential to Achieve Goals: Good    Frequency Min 4X/week     Co-evaluation               AM-PAC PT "6 Clicks" Mobility  Outcome Measure Help needed turning from your back to your side while in a flat bed without using bedrails?: A Little Help needed moving from lying on your back to sitting on the side of a flat bed without using bedrails?: A  Little Help needed moving to and from a bed to a chair (including a wheelchair)?: A Little Help needed standing up from a chair using your arms (e.g., wheelchair or bedside chair)?: A Little Help needed to walk in hospital room?: A Little Help needed climbing 3-5 steps with a railing? : A Lot 6 Click Score: 17    End of Session Equipment Utilized During Treatment: Gait belt Activity Tolerance: Patient tolerated treatment well Patient left: in chair;with call bell/phone within reach;with chair alarm set Nurse Communication: Mobility status PT Visit Diagnosis: Other abnormalities of gait and mobility (R26.89);Muscle weakness (generalized) (M62.81);Pain Pain - Right/Left: Left Pain - part of body: Leg    Time: 0750-0823 PT Time Calculation (min) (ACUTE ONLY): 33 min   Charges:   PT Evaluation $PT Eval Moderate Complexity: 1 Mod PT Treatments $Therapeutic Activity: 8-22 mins        Bayard Males, PT Acute Rehabilitation Services Office: [phone]   Sandy Salaam Alyjah Lovingood 04/11/2022, 9:01 AM

## 2022-04-11 NOTE — Progress Notes (Addendum)
Orthopaedic Trauma Progress Note  SUBJECTIVE: Doing okay this morning. Feels like pain is better controlled today once switched to her home dose oxycodone and tizanidine yesterday.  Was able to mobilize out of bed with therapies yesterday, did well with this.  Patient notes that the gabapentin she is taking helps with pain but the medication does cause a pretty significant side effect.  She is noting involuntary movement of her hands and difficulty standing.  When she goes to stand, notes her legs give out.  Has had this issue in the past with this medication. States the same thing has previously happened with Lyrica. Husband at bedside  OBJECTIVE:  Vitals:   04/12/22 0550 04/12/22 1030  BP: (!) 104/59 113/65  Pulse: 93 (!) 102  Resp: 16 18  Temp: 98.8 F (37.1 C) 98.5 F (36.9 C)  SpO2: 95% 100%    General: Sitting up in bed, no acute distress. Noted to have some dyskinesia  Respiratory: No increased work of breathing.  Left lower extremity: Dressing removed, incisions clean, dry, intact.  Incisions left open to air.  Tender about the knee as expected.  Tolerates very small amount of knee range of motion.  Able to wiggle toes.  Ankle dorsiflexion plantarflexion are intact.  Endorses sensation throughout extremity. + DP pulse  IMAGING: Stable post op imaging.   LABS:  Results for orders placed or performed during the hospital encounter of 04/09/22 (from the past 24 hour(s))  CBC with Differential/Platelet     Status: Abnormal   Collection Time: 04/12/22  6:32 AM  Result Value Ref Range   WBC 15.9 (H) 4.0 - 10.5 K/uL   RBC 3.48 (L) 3.87 - 5.11 MIL/uL   Hemoglobin 10.6 (L) 12.0 - 15.0 g/dL   HCT 33.0 (L) 36.0 - 46.0 %   MCV 94.8 80.0 - 100.0 fL   MCH 30.5 26.0 - 34.0 pg   MCHC 32.1 30.0 - 36.0 g/dL   RDW 13.5 11.5 - 15.5 %   Platelets 357 150 - 400 K/uL   nRBC 0.0 0.0 - 0.2 %   Neutrophils Relative % 79 %   Neutro Abs 12.6 (H) 1.7 - 7.7 K/uL   Lymphocytes Relative 14 %   Lymphs  Abs 2.2 0.7 - 4.0 K/uL   Monocytes Relative 4 %   Monocytes Absolute 0.7 0.1 - 1.0 K/uL   Eosinophils Relative 2 %   Eosinophils Absolute 0.4 0.0 - 0.5 K/uL   Basophils Relative 0 %   Basophils Absolute 0.1 0.0 - 0.1 K/uL   Immature Granulocytes 1 %   Abs Immature Granulocytes 0.08 (H) 0.00 - 0.07 K/uL  Basic metabolic panel     Status: Abnormal   Collection Time: 04/12/22  6:32 AM  Result Value Ref Range   Sodium 138 135 - 145 mmol/L   Potassium 2.9 (L) 3.5 - 5.1 mmol/L   Chloride 103 98 - 111 mmol/L   CO2 25 22 - 32 mmol/L   Glucose, Bld 116 (H) 70 - 99 mg/dL   BUN 11 8 - 23 mg/dL   Creatinine, Ser 0.96 0.44 - 1.00 mg/dL   Calcium 8.9 8.9 - 10.3 mg/dL   GFR, Estimated >60 >60 mL/min   Anion gap 10 5 - 15  Magnesium     Status: None   Collection Time: 04/12/22  6:32 AM  Result Value Ref Range   Magnesium 2.2 1.7 - 2.4 mg/dL    ASSESSMENT: Rachael Jordan is a 67 y.o. female s/p fall,  2 Days Post-Op s/p OPEN REDUCTION INTERNAL FIXATION LEFT TIBIAL PLATEAU VERSUS  CV/Blood loss: Acute blood loss anemia, Hgb 10.6 this AM. Stable Hemodynamically stable  PLAN: Weightbearing: NWB LLE ROM:  ok for unrestricted ROM as tolerated  Incisional and dressing care:  Ok to leave incisions open to air Showering:  ok to begin getting incisions wet in the shower 04/14/2022 Orthopedic device(s): None  Pain management:  1. Tylenol 1000 mg q 6 hours scheduled 2. Tizanidine 4 mg q 6 hours PRN 3. Oxycodone 15 mg q 6 hours PRN 4. Dilaudid 0.5-1 mg q 2 hours PRN 5. Gabapentin 800 mg 3 times daily 6. Toradol 30 mg every 6 hours PRN x 5 days VTE prophylaxis: Lovenox, SCDs ID:  Ancef 2gm post op completed Foley/Lines:  No foley, KVO IVFs Impediments to Fracture Healing: Vitamin D level 41, no additional supplementation indicated.  Continue home dose supplement Dispo: PT/OT evaluation ongoing.  Currently recommending home health therapies.  Continue working on pain control today.  Discontinue  gabapentin due to generalized dyskinesia. Possible discharge home tomorrow if pain better controlled.  Have sent discharge Rx for pain control and DVT prophylaxis to patient's pharmacy on file  D/C recommendations: -Oxycodone 15 mg and home dose tizanidine for pain control -Aspirin 325 mg daily x 30 days for DVT prophylaxis -Continue home dose Vit D supplementation  Follow - up plan: 2 weeks after d/c for wound check and repeat x-rays   Contact information:  Katha Hamming MD, Rushie Nyhan PA-C. After hours and holidays please check [email or web address] for group call information for [facility]   Gwinda Passe, PA-C [phone] (office) [email or web address]

## 2022-04-11 NOTE — Anesthesia Postprocedure Evaluation (Signed)
Anesthesia Post Note  Patient: Ambulance person  Procedure(s) Performed: OPEN REDUCTION INTERNAL FIXATION (ORIF) TIBIAL PLATEAU VERSUS (Left: Leg Lower)     Patient location during evaluation: PACU Anesthesia Type: General Level of consciousness: awake and alert Pain management: pain level controlled Vital Signs Assessment: post-procedure vital signs reviewed and stable Respiratory status: spontaneous breathing, nonlabored ventilation, respiratory function stable and patient connected to nasal cannula oxygen Cardiovascular status: blood pressure returned to baseline and stable Postop Assessment: no apparent nausea or vomiting Anesthetic complications: no   No notable events documented.  Last Vitals:  Vitals:   04/11/22 1441 04/11/22 1647  BP: (!) 73/58 (!) 97/52  Pulse: 89 82  Resp:    Temp: 36.6 C   SpO2: 100% 100%    Last Pain:  Vitals:   04/11/22 1732  TempSrc:   PainSc: Tyler Deis

## 2022-04-11 NOTE — Progress Notes (Signed)
PROGRESS NOTE    Rachael Jordan  ZOX:[id number] DOB: 1955-06-28 DOA: 04/09/2022 PCP: Sharyne Peach, MD   Brief Narrative:  67 y.o. female with medical history significant of chronic pain and HTN presented with a mechanical fall.  She was found to have left closed comminuted bicondylar tibial plateau fracture and admitted to [facility].  She was subsequently transferred to [facility] for orthopedics intervention.  Assessment & Plan:   Left closed comminuted bicondylar tibial plateau fracture after a mechanical fall -Orthopedics following: Status post surgical intervention with ORIF on 04/10/2022.  Wound care and DVT prophylaxis as per orthopedics recommendations.  Continue pain management.  Fall precautions. -PT/OT eval.  Chronic pain -Continue current pain management.  Outpatient follow-up with pain management -Continue nortriptyline  Leukocytosis -Possibly reactive.  Still significantly elevated WBCs of 16.8 today.    Thrombocytosis -reactive.  Resolved  Normocytic anemia -Questionable cause.  Hemoglobin stable.  Hyponatremia -Labs pending for today  Hypokalemia -Labs pending for today  Essential hypertension -Continue amlodipine  Hyperlipidemia--continue statin  OSA on CPAP -Continue CPAP  Asthma--stable.  Continue albuterol as needed  History of urinary incontinence -Continue Myrbetriq    DVT prophylaxis: SCDs Code Status: Full Family Communication: None at bedside Disposition Plan: Status is: Inpatient Remains inpatient appropriate because: Of need for PT eval  Consultants: Orthopedics  Procedures: As above Antimicrobials: Perioperative  Subjective: Patient seen and examined at bedside.  Denies fever, shortness of breath, chest pain.  Has intermittent left knee pain. Objective: Vitals:   04/10/22 1427 04/10/22 1931 04/10/22 2043 04/11/22 0545  BP: (!) 153/86 132/70  119/66  Pulse: (!) 106 (!) 108  98  Resp: 19   16  Temp: 98.7 F (37.1  C) 98.3 F (36.8 C)  98.1 F (36.7 C)  TempSrc: Oral Oral  Oral  SpO2: 95% 97% 97% 97%  Weight:      Height:        Intake/Output Summary (Last 24 hours) at 04/11/2022 0722 Last data filed at 04/11/2022 8119 Gross per 24 hour  Intake 1650.56 ml  Output 575 ml  Net 1075.56 ml   Filed Weights   04/09/22 1610 04/10/22 0820  Weight: 70.8 kg 70.8 kg    Examination:  General: On room air.  No distress.  respiratory: Decreased breath sounds at bases bilaterally with some crackles CVS: Currently rate controlled; S1-S2 heard  abdominal: Soft, nontender, slightly distended, no organomegaly; bowel sounds are heard  extremities: Left knee dressing present.  No lower extremity cyanosis    Data Reviewed: I have personally reviewed following labs and imaging studies  CBC: Recent Labs  Lab 04/08/22 2010 04/10/22 0712 04/11/22 0643  WBC 18.3* 14.2* 16.8*  NEUTROABS 14.7*  --  13.0*  HGB 11.4* 11.9* 10.6*  HCT 34.1* 37.6 32.4*  MCV 89.0 94.2 92.8  PLT 424* 394 147    Basic Metabolic Panel: Recent Labs  Lab 04/08/22 2010 04/10/22 0712  NA 137 134*  K 3.0* 3.3*  CL 105 97*  CO2 20* 23  GLUCOSE 118* 111*  BUN 10 16  CREATININE 0.73 1.12*  CALCIUM 9.3 8.7*    GFR: Estimated Creatinine Clearance: 50.1 mL/min (A) (by C-G formula based on SCr of 1.12 mg/dL (H)). Liver Function Tests: No results for input(s): "AST", "ALT", "ALKPHOS", "BILITOT", "PROT", "ALBUMIN" in the last 168 hours. No results for input(s): "LIPASE", "AMYLASE" in the last 168 hours. No results for input(s): "AMMONIA" in the last 168 hours. Coagulation Profile: No results for input(s): "INR", "  PROTIME" in the last 168 hours. Cardiac Enzymes: No results for input(s): "CKTOTAL", "CKMB", "CKMBINDEX", "TROPONINI" in the last 168 hours. BNP (last 3 results) No results for input(s): "PROBNP" in the last 8760 hours. HbA1C: No results for input(s): "HGBA1C" in the last 72 hours. CBG: No results for  input(s): "GLUCAP" in the last 168 hours. Lipid Profile: No results for input(s): "CHOL", "HDL", "LDLCALC", "TRIG", "CHOLHDL", "LDLDIRECT" in the last 72 hours. Thyroid Function Tests: No results for input(s): "TSH", "T4TOTAL", "FREET4", "T3FREE", "THYROIDAB" in the last 72 hours. Anemia Panel: No results for input(s): "VITAMINB12", "FOLATE", "FERRITIN", "TIBC", "IRON", "RETICCTPCT" in the last 72 hours. Sepsis Labs: No results for input(s): "PROCALCITON", "LATICACIDVEN" in the last 168 hours.  Recent Results (from the past 240 hour(s))  Surgical pcr screen     Status: Abnormal   Collection Time: 04/09/22 10:34 PM   Specimen: Nasal Mucosa; Nasal Swab  Result Value Ref Range Status   MRSA, PCR NEGATIVE NEGATIVE Final   Staphylococcus aureus POSITIVE (A) NEGATIVE Final    Comment: (NOTE) The Xpert SA Assay (FDA approved for NASAL specimens in patients 15 years of age and older), is one component of a comprehensive surveillance program. It is not intended to diagnose infection nor to guide or monitor treatment. Performed at [facility], [place] [address], [place], [place] [address]          Radiology Studies: DG Knee Left Port  Result Date: 04/10/2022 CLINICAL DATA:  Postop ORIF tibial plateau EXAM: PORTABLE LEFT KNEE - 2 VIEW COMPARISON:  None Available. FINDINGS: Postop changes from recent tibial plateau ORIF with grossly anatomic alignment. IMPRESSION: Status post tibial plateau ORIF. Electronically Signed   By: Sammie Bench M.D.   On: 04/10/2022 15:33   DG Knee Complete 4 Views Left  Result Date: 04/10/2022 CLINICAL DATA:  LEFT tibial plateau fracture EXAM: LEFT KNEE - COMPLETE 4+ VIEW COMPARISON:  None Available. FINDINGS: Multiple spot intraoperative views of the knee provided. Dynamic plate fixation lateral tibial plateau fracture with fixation plate and multiple cortical screws. No complicating features IMPRESSION: ORIF LEFT to plateau fracture Electronically Signed    By: Suzy Bouchard M.D.   On: 04/10/2022 12:06   DG C-Arm 1-60 Min-No Report  Result Date: 04/10/2022 Fluoroscopy was utilized by the requesting physician.  No radiographic interpretation.        Scheduled Meds:  acetaminophen  650 mg Oral Q6H   amLODipine  5 mg Oral Daily   aspirin EC  81 mg Oral Daily   atorvastatin  20 mg Oral Daily   azelastine  1-2 spray Each Nare BID   docusate sodium  100 mg Oral BID   enoxaparin (LOVENOX) injection  40 mg Subcutaneous Q24H   loratadine  10 mg Oral Daily   mirabegron ER  50 mg Oral Daily   nortriptyline  10 mg Oral BID   pantoprazole  40 mg Oral Daily   traZODone  100 mg Oral QHS   Continuous Infusions:  sodium chloride 50 mL/hr at 04/10/22 2102    ceFAZolin (ANCEF) IV 2 g (04/11/22 0538)   methocarbamol (ROBAXIN) IV            Aline August, MD [facility] 04/11/2022, 7:22 AM

## 2022-04-11 NOTE — TOC CAGE-AID Note (Signed)
Transition of Care [facility]) - CAGE-AID Screening   Patient Details  Name: Rachael Jordan MRN: [id number] Date of Birth: [birth year]  Transition of [facility]) CM/SW Contact:    Bethann Berkshire, [place] Phone Number: 04/11/2022, 10:39 AM   CAGE-AID Screening:    Have You Ever Felt You Ought to Cut Down on Your Drinking or Drug Use?: No Have People Annoyed You By [employer] Your Drinking Or Drug Use?: No Have You Felt Bad Or Guilty About Your Drinking Or Drug Use?: No Have You Ever Had a Drink or Used Drugs First Thing In The Morning to Steady Your Nerves or to Get Rid of a Hangover?: No CAGE-AID Score: 0  Substance Abuse Education Offered: No

## 2022-04-11 NOTE — Evaluation (Signed)
Occupational Therapy Evaluation Patient Details Name: Rachael Jordan MRN: [id number] DOB: [birth year] Today's Date: 04/11/2022   History of Present Illness 67 yo female admitted 1/22 after falling over baby gate and landing on LLE with tibial plateau fx. 1/24 ORIF. PMhx: anxiety, asthma, CVA, scoliosis   Clinical Impression   Patient admitted for the diagnosis above.  PTA she lives with family, worked full time, and needed no assist with ADL, iADL or mobility.  Pain is the primary deficits.  Currently she is needing up to Mod A for basic mobility, and Max A for lower body ADL from a sit to stand level.  OT to continue efforts in the acute setting to address deficits listed, and assist with eventual transition home.  Patient's may discharged to her mother's home, depending on progress.  [place] OT can be considered for post acute rehab.         Recommendations for follow up therapy are one component of a multi-disciplinary discharge planning process, led by the attending physician.  Recommendations may be updated based on patient status, additional functional criteria and insurance authorization.   Follow Up Recommendations  Home health OT     Assistance Recommended at Discharge Intermittent Supervision/Assistance  Patient can return home with the following Help with stairs or ramp for entrance;A lot of help with walking and/or transfers;A lot of help with bathing/dressing/bathroom;Assistance with cooking/housework;Assist for transportation    Functional Status Assessment  Patient has had a recent decline in their functional status and demonstrates the ability to make significant improvements in function in a reasonable and predictable amount of time.  Equipment Recommendations  None recommended by OT    Recommendations for Other Services       Precautions / Restrictions Precautions Precautions: Fall Restrictions Weight Bearing Restrictions: Yes LLE Weight Bearing: Non weight bearing       Mobility Bed Mobility Overal bed mobility: Needs Assistance Bed Mobility: Sit to Supine       Sit to supine: Min assist        Transfers Overall transfer level: Needs assistance   Transfers: Sit to/from Stand, Bed to chair/wheelchair/BSC Sit to Stand: Min assist, Mod assist     Step pivot transfers: Min assist            Balance Overall balance assessment: Needs assistance   Sitting balance-Leahy Scale: Fair   Postural control: Posterior lean Standing balance support: Reliant on assistive device for balance Standing balance-Leahy Scale: Poor                             ADL either performed or assessed with clinical judgement   ADL       Grooming: Wash/dry hands;Wash/dry face;Set up;Sitting           Upper Body Dressing : Set up;Sitting   Lower Body Dressing: Maximal assistance;Sit to/from stand   Toilet Transfer: Minimal assistance;Stand-pivot;Rolling walker (2 wheels);BSC/3in1;Moderate assistance                   Vision Patient Visual Report: No change from baseline       Perception     Praxis      Pertinent Vitals/Pain Pain Assessment Pain Score: 10-Worst pain ever Pain Location: L leg Pain Descriptors / Indicators: Penetrating, Crying, Grimacing, Guarding, Shooting, Sharp, Burning Pain Intervention(s): Monitored during session, Patient requesting pain meds-RN notified     Hand Dominance Right   Extremity/Trunk Assessment Upper Extremity Assessment Upper Extremity  Assessment: Generalized weakness   Lower Extremity Assessment Lower Extremity Assessment: Defer to PT evaluation LLE Deficits / Details: limited by pain   Cervical / Trunk Assessment Cervical / Trunk Assessment: Normal   Communication Communication Communication: No difficulties   Cognition Arousal/Alertness: Awake/alert Behavior During Therapy: Flat affect Overall Cognitive Status: Within Functional Limits for tasks assessed                                        General Comments   VSS on RA    Exercises     Shoulder Instructions      Home Living Family/patient expects to be discharged to:: Private residence Living Arrangements: Spouse/significant other Available Help at Discharge: Family;Available 24 hours/day Type of Home: House Home Access: Stairs to enter [employer] of Steps: 2   Home Layout: Two level;Bed/bath upstairs;1/2 bath on main level     Bathroom Shower/Tub: Teacher, early years/pre: Standard Bathroom Accessibility: Yes How Accessible: Accessible via walker Home Equipment: [place] - single point;Crutches   Additional Comments: lives with sister and nieces, spouse present during eval.  Patient may discharge to mother's home depending on progress      Prior Functioning/Environment Prior Level of Function : Independent/Modified Independent;Driving;Working/employed                        OT Problem List: Decreased strength;Decreased range of motion;Decreased activity tolerance;Impaired balance (sitting and/or standing);Pain      OT Treatment/Interventions: Self-care/ADL training;Therapeutic exercise;Therapeutic activities;DME and/or AE instruction;Patient/family education;Balance training    OT Goals(Current goals can be found in the care plan section) Acute Rehab OT Goals Patient Stated Goal: Return home OT Goal Formulation: With patient Time For Goal Achievement: 04/25/22 Potential to Achieve Goals: Good ADL Goals Pt Will Perform Grooming: with supervision;standing Pt Will Perform Lower Body Dressing: with min assist;sit to/from stand Pt Will Transfer to Toilet: with supervision;ambulating;regular height toilet Pt/caregiver will Perform Home Exercise Program: Increased strength;Both right and left upper extremity;With theraband;With Supervision  OT Frequency: Min 2X/week    Co-evaluation              AM-PAC OT "6 Clicks" Daily Activity      Outcome Measure Help from another person eating meals?: None Help from another person taking care of personal grooming?: A Little Help from another person toileting, which includes using toliet, bedpan, or urinal?: A Lot Help from another person bathing (including washing, rinsing, drying)?: A Lot Help from another person to put on and taking off regular upper body clothing?: A Little Help from another person to put on and taking off regular lower body clothing?: A Lot 6 Click Score: 16   End of Session Equipment Utilized During Treatment: Rolling walker (2 wheels) Nurse Communication: Mobility status  Activity Tolerance: Patient limited by pain Patient left: in bed;with call bell/phone within reach;with family/visitor present  OT Visit Diagnosis: Unsteadiness on feet (R26.81);Muscle weakness (generalized) (M62.81);Pain Pain - Right/Left: Left Pain - part of body: Leg                Time: 1050-1109 OT Time Calculation (min): 19 min Charges:  OT General Charges $OT Visit: 1 Visit OT Evaluation $OT Eval Moderate Complexity: 1 Mod  04/11/2022  RP, OTR/L  Acute Rehabilitation Services  Office:  [phone]   Metta Clines 04/11/2022, 11:27 AM

## 2022-04-12 LAB — MAGNESIUM: Magnesium: 2.2 mg/dL (ref 1.7–2.4)

## 2022-04-12 LAB — BASIC METABOLIC PANEL
Anion gap: 10 (ref 5–15)
BUN: 11 mg/dL (ref 8–23)
CO2: 25 mmol/L (ref 22–32)
Calcium: 8.9 mg/dL (ref 8.9–10.3)
Chloride: 103 mmol/L (ref 98–111)
Creatinine, Ser: 0.96 mg/dL (ref 0.44–1.00)
GFR, Estimated: >60 mL/min (ref >60)
Glucose, Bld: 116 mg/dL — ABNORMAL HIGH (ref 70–99)
Potassium: 2.9 mmol/L — ABNORMAL LOW (ref 3.5–5.1)
Sodium: 138 mmol/L (ref 135–145)

## 2022-04-12 LAB — CBC WITH DIFFERENTIAL/PLATELET
Abs Immature Granulocytes: 0.08 10*3/uL — ABNORMAL HIGH (ref 0.00–0.07)
Basophils Absolute: 0.1 10*3/uL (ref 0.0–0.1)
Basophils Relative: 0 %
Eosinophils Absolute: 0.4 10*3/uL (ref 0.0–0.5)
Eosinophils Relative: 2 %
HCT: 33 % — ABNORMAL LOW (ref 36.0–46.0)
Hemoglobin: 10.6 g/dL — ABNORMAL LOW (ref 12.0–15.0)
Immature Granulocytes: 1 %
Lymphocytes Relative: 14 %
Lymphs Abs: 2.2 10*3/uL (ref 0.7–4.0)
MCH: 30.5 pg (ref 26.0–34.0)
MCHC: 32.1 g/dL (ref 30.0–36.0)
MCV: 94.8 fL (ref 80.0–100.0)
Monocytes Absolute: 0.7 10*3/uL (ref 0.1–1.0)
Monocytes Relative: 4 %
Neutro Abs: 12.6 10*3/uL — ABNORMAL HIGH (ref 1.7–7.7)
Neutrophils Relative %: 79 %
Platelets: 357 10*3/uL (ref 150–400)
RBC: 3.48 MIL/uL — ABNORMAL LOW (ref 3.87–5.11)
RDW: 13.5 % (ref 11.5–15.5)
WBC: 15.9 10*3/uL — ABNORMAL HIGH (ref 4.0–10.5)
nRBC: 0 % (ref 0.0–0.2)

## 2022-04-12 LAB — VITAMIN D 25 HYDROXY (VIT D DEFICIENCY, FRACTURES)

## 2022-04-12 MED ORDER — MUPIROCIN 2 % EX OINT
1.0000 | TOPICAL_OINTMENT | Freq: Two times a day (BID) | CUTANEOUS | Status: DC
  Administered 2022-04-12 – 2022-04-15 (×7): 1 via NASAL
  Filled 2022-04-12 (×4): qty 22

## 2022-04-12 MED ORDER — OXYCODONE HCL 15 MG PO TABS: 15.0000 mg | ORAL_TABLET | Freq: Four times a day (QID) | ORAL | 0 refills | Status: DC | PRN

## 2022-04-12 MED ORDER — CHLORHEXIDINE GLUCONATE CLOTH 2 % EX PADS
6.0000 | MEDICATED_PAD | Freq: Every day | CUTANEOUS | Status: DC
  Administered 2022-04-12 – 2022-04-15 (×4): 6 via TOPICAL

## 2022-04-12 MED ORDER — POTASSIUM CHLORIDE CRYS ER 20 MEQ PO TBCR
40.0000 meq | EXTENDED_RELEASE_TABLET | ORAL | Status: AC
  Administered 2022-04-12 (×2): 40 meq via ORAL
  Filled 2022-04-12 (×2): qty 2

## 2022-04-12 MED ORDER — ASPIRIN 325 MG PO TBEC: 325.0000 mg | DELAYED_RELEASE_TABLET | Freq: Every day | ORAL | 0 refills | Status: AC

## 2022-04-12 MED ORDER — TIZANIDINE HCL 4 MG PO TABS: 4.0000 mg | ORAL_TABLET | Freq: Four times a day (QID) | ORAL | 0 refills | Status: AC | PRN

## 2022-04-12 MED ORDER — ACETAMINOPHEN 500 MG PO TABS: 1000.0000 mg | ORAL_TABLET | Freq: Four times a day (QID) | ORAL | 0 refills | Status: AC | PRN

## 2022-04-12 NOTE — Progress Notes (Signed)
Orthopaedic Trauma Progress Note  SUBJECTIVE: Doing okay this morning. Pain manageable but not well controlled. Patient takes Oxycodone 15 mg 3-4x/day at home for chronic pain, this was confirmed on [place]. I will switch her to this today. I will also switch her to home does muscle relaxer (tizanidine 4 mg). No chest pain, SOB, N/V. Denies any numbness or tingling. No other complaints. No family at bedside currently    OBJECTIVE: Blood pressure  Blood pressure (!) 104/59, pulse 93, temperature 98.8 F (37.1 C), temperature source Oral, resp. rate 16, height '5\' 5"'$  (1.651 m), weight 70.8 kg, SpO2 95 %.   General: Sitting up in bedside chair, no acute distress. Respiratory: No increased work of breathing.  Left lower extremity: Dressings clean, dry, intact.  Incisions left open to air.  Tender about the knee as expected.  Tolerates very small amount of knee range of motion.  Able to wiggle toes.  Ankle dorsiflexion plantarflexion are intact.  Endorses sensation throughout extremity. + DP pulse  IMAGING: Stable post op imaging.   LABS:  Results for orders placed or performed during the hospital encounter of 04/09/22 (from the past 48 hour(s))  VITAMIN D 25 Hydroxy (Vit-D Deficiency, Fractures)     Status: None   Collection Time: 04/10/22  9:06 PM  Result Value Ref Range   Vit D, 25-Hydroxy See Scanned report in [place] 30 - 100 ng/mL    Comment: Performed at [facility] (NOTE) Vitamin D deficiency has been defined by the [place] practice guideline as a level of serum 25-OH  vitamin D less than 20 ng/mL (1,2). The Endocrine Society went on to  further define vitamin D insufficiency as a level between 21 and 29  ng/mL (2).  1. IOM (Institute of Medicine). 2010. Dietary reference intakes for  calcium and D. [place]: The [employer]. 2. Holick MF, Binkley [place], Bischoff-Ferrari HA, et al. Evaluation,  treatment, and  prevention of vitamin D deficiency: an Endocrine  Society clinical practice guideline, JCEM. 2011 Jul; 96(7): 1911-30.  Performed at [facility], [place] [address] [place], [place] [address]   CBC with Differential/Platelet     Status: Abnormal   Collection Time: 04/11/22  6:43 AM  Result Value Ref Range   WBC 16.8 (H) 4.0 - 10.5 K/uL   RBC 3.49 (L) 3.87 - 5.11 MIL/uL   Hemoglobin 10.6 (L) 12.0 - 15.0 g/dL   HCT 32.4 (L) 36.0 - 46.0 %   MCV 92.8 80.0 - 100.0 fL   MCH 30.4 26.0 - 34.0 pg   MCHC 32.7 30.0 - 36.0 g/dL   RDW 13.3 11.5 - 15.5 %   Platelets 303 150 - 400 K/uL   nRBC 0.0 0.0 - 0.2 %   Neutrophils Relative % 77 %   Neutro Abs 13.0 (H) 1.7 - 7.7 K/uL   Lymphocytes Relative 12 %   Lymphs Abs 1.9 0.7 - 4.0 K/uL   Monocytes Relative 10 %   Monocytes Absolute 1.7 (H) 0.1 - 1.0 K/uL   Eosinophils Relative 0 %   Eosinophils Absolute 0.0 0.0 - 0.5 K/uL   Basophils Relative 0 %   Basophils Absolute 0.0 0.0 - 0.1 K/uL   Immature Granulocytes 1 %   Abs Immature Granulocytes 0.08 (H) 0.00 - 0.07 K/uL    Comment: Performed at [facility] [address] [place],  [address]  Basic metabolic panel  Status: Abnormal   Collection Time: 04/11/22  6:43 AM  Result Value Ref Range   Sodium 138 135 - 145 mmol/L   Potassium 4.0 3.5 - 5.1 mmol/L   Chloride 102 98 - 111 mmol/L   CO2 25 22 - 32 mmol/L   Glucose, Bld 112 (H) 70 - 99 mg/dL    Comment: Glucose reference range applies only to samples taken after fasting for at least 8 hours.   BUN 11 8 - 23 mg/dL   Creatinine, Ser 0.91 0.44 - 1.00 mg/dL   Calcium 8.6 (L) 8.9 - 10.3 mg/dL   GFR, Estimated >60 >60 mL/min    Comment: (NOTE) Calculated using the CKD-EPI Creatinine Equation (2021)    Anion gap 11 5 - 15    Comment: Performed at [facility] [address], [place], [place] [address]  Magnesium     Status: None   Collection Time: 04/11/22  6:43 AM  Result Value Ref Range   Magnesium 2.3 1.7 -  2.4 mg/dL    Comment: Performed at [facility] [address], [place], [place] [address]  CBC with Differential/Platelet     Status: Abnormal   Collection Time: 04/12/22  6:32 AM  Result Value Ref Range   WBC 15.9 (H) 4.0 - 10.5 K/uL   RBC 3.48 (L) 3.87 - 5.11 MIL/uL   Hemoglobin 10.6 (L) 12.0 - 15.0 g/dL   HCT 33.0 (L) 36.0 - 46.0 %   MCV 94.8 80.0 - 100.0 fL   MCH 30.5 26.0 - 34.0 pg   MCHC 32.1 30.0 - 36.0 g/dL   RDW 13.5 11.5 - 15.5 %   Platelets 357 150 - 400 K/uL   nRBC 0.0 0.0 - 0.2 %   Neutrophils Relative % 79 %   Neutro Abs 12.6 (H) 1.7 - 7.7 K/uL   Lymphocytes Relative 14 %   Lymphs Abs 2.2 0.7 - 4.0 K/uL   Monocytes Relative 4 %   Monocytes Absolute 0.7 0.1 - 1.0 K/uL   Eosinophils Relative 2 %   Eosinophils Absolute 0.4 0.0 - 0.5 K/uL   Basophils Relative 0 %   Basophils Absolute 0.1 0.0 - 0.1 K/uL   Immature Granulocytes 1 %   Abs Immature Granulocytes 0.08 (H) 0.00 - 0.07 K/uL    Comment: Performed at [facility] [address] [place], [place] [address]  Basic metabolic panel     Status: Abnormal   Collection Time: 04/12/22  6:32 AM  Result Value Ref Range   Sodium 138 135 - 145 mmol/L   Potassium 2.9 (L) 3.5 - 5.1 mmol/L   Chloride 103 98 - 111 mmol/L   CO2 25 22 - 32 mmol/L   Glucose, Bld 116 (H) 70 - 99 mg/dL    Comment: Glucose reference range applies only to samples taken after fasting for at least 8 hours.   BUN 11 8 - 23 mg/dL   Creatinine, Ser 0.96 0.44 - 1.00 mg/dL   Calcium 8.9 8.9 - 10.3 mg/dL   GFR, Estimated >60 >60 mL/min    Comment: (NOTE) Calculated using the CKD-EPI Creatinine Equation (2021)    Anion gap 10 5 - 15    Comment: Performed at [facility] [address] [place], [place] [address]  Magnesium     Status: None   Collection Time: 04/12/22  6:32 AM  Result Value Ref Range   Magnesium 2.2 1.7 - 2.4 mg/dL    Comment:  Performed at [facility], [place] [address], [place], [place] [address]      ASSESSMENT: Yevette Knust is a 67 y.o. female s/p fall, 1 day post op s/p OPEN REDUCTION INTERNAL FIXATION LEFT TIBIAL PLATEAU VERSUS  CV/Blood loss: Acute blood loss anemia, Hgb 10.6 this AM. Hemodynamically stable  PLAN: Weightbearing: NWB LLE ROM:  ok for unrestricted ROM as tolerated  Incisional and dressing care:  Reinforce dressing PRN Showering:  ok to begin getting incisions wet in the shower 04/14/2022 Orthopedic device(s): None  Pain management:  1. Tylenol 1000 mg q 6 hours scheduled 2. Tizanidine 4 mg q 6 hours PRN 3. Oxycodone 15 mg q 6 hours PRN 4. Dilaudid 0.5-1 mg q 2 hours PRN 5. Gabapentin 800 mg 3 times daily 6. Toradol 30 mg every 6 hours PRN x 5 days VTE prophylaxis: Lovenox, SCDs ID:  Ancef 2gm post op  Foley/Lines:  No foley, KVO IVFs Impediments to Fracture Healing: Vitamin D level 41, no additional supplementation indicated.  Continue home dose supplement Dispo: PT/OT evaluation, recommending home health therapies.  Continue working on pain control today. Possible discharge home tomorrow if pain controlled.    D/C recommendations: -Oxycodone 15 mg and home dose tizanidine for pain control -Aspirin 325 mg daily x 30 days for DVT prophylaxis -Continue home dose Vit D supplementation  Follow - up plan: 2 weeks after d/c for wound check and repeat x-rays   Contact information:  Katha Hamming MD, Rushie Nyhan PA-C. After hours and holidays please check [email or web address] for group call information for [facility]   Gwinda Passe, PA-C [phone] (office) [email or web address]

## 2022-04-12 NOTE — TOC Progression Note (Cosign Needed)
Transition of Care [facility]) - Progression Note    Patient Details  Name: Rachael Jordan MRN: [id number] Date of Birth: November 23, 1955  Transition of Care [facility]) CM/SW Contact  Loletha Grayer Beverely Pace, RN Phone Number: 04/12/2022, 11:11 AM  Clinical Narrative:     Patient needs bedside commode d/t inability to ambulate greater than 45 feet to Bathroom.        Expected Discharge Plan and Services                                               Social Determinants of Health (SDOH) Interventions SDOH Screenings   Food Insecurity: No Food Insecurity (04/11/2022)  Housing: Low Risk  (04/11/2022)  Transportation Needs: No Transportation Needs (04/11/2022)  Utilities: Not At Risk (04/11/2022)  Depression (PHQ2-9): Low Risk  (09/14/2020)  Tobacco Use: Low Risk  (04/11/2022)    Readmission Risk Interventions     No data to display

## 2022-04-12 NOTE — Progress Notes (Signed)
Physical Therapy Treatment Patient Details Name: Rachael Jordan MRN: [id number] DOB: 14-Oct-1955 Today's Date: 04/12/2022   History of Present Illness 67 yo female admitted 1/22 after falling over baby gate and landing on LLE with tibial plateau fx. 1/24 ORIF. PMhx: anxiety, asthma, CVA, scoliosis    PT Comments    Pt progressing with transfers and gait but limited by jerking all extremities. Pt educated for HEP, stairs, progressive gait and transfers. Pt encouraged to continue to be up to [facility] throughout day and address medication with MD for jerking. Will continue to follow.     Recommendations for follow up therapy are one component of a multi-disciplinary discharge planning process, led by the attending physician.  Recommendations may be updated based on patient status, additional functional criteria and insurance authorization.  Follow Up Recommendations  Home health PT     Assistance Recommended at Discharge Intermittent Supervision/Assistance  Patient can return home with the following A little help with walking and/or transfers;A little help with bathing/dressing/bathroom;Assist for transportation;Assistance with cooking/housework   Equipment Recommendations  Rolling walker (2 wheels);BSC/3in1    Recommendations for Other Services       Precautions / Restrictions Precautions Precautions: Fall Precaution Comments: jerking all extremities - pt states due to gabapentin Restrictions LLE Weight Bearing: Non weight bearing     Mobility  Bed Mobility Overal bed mobility: Needs Assistance Bed Mobility: Supine to Sit     Supine to sit: Min guard, HOB elevated     General bed mobility comments: HOB 25 degrees with pt able to pivot to EOB without assist    Transfers Overall transfer level: Needs assistance   Transfers: Sit to/from Stand Sit to Stand: Min guard           General transfer comment: cues for hand placement and safety     Ambulation/Gait Ambulation/Gait assistance: Min guard Gait Distance (Feet): 25 Feet Assistive device: Rolling walker (2 wheels) Gait Pattern/deviations: Step-to pattern   Gait velocity interpretation: 1.31 - 2.62 ft/sec, indicative of limited community ambulator   General Gait Details: pt maintaining NWB and step to pattern however gait limited by pt jerking all extremities. Pt walked 5', 5' then 25'   Stairs Stairs: Yes Stairs assistance: Min assist Stair Management: With walker, Backwards Number of Stairs: 2 General stair comments: cues for sequence with assist to stabilize RW   Wheelchair Mobility    Modified Rankin (Stroke Patients Only)       Balance Overall balance assessment: Needs assistance   Sitting balance-Leahy Scale: Fair     Standing balance support: Reliant on assistive device for balance, Bilateral upper extremity supported Standing balance-Leahy Scale: Poor Standing balance comment: reliant on RW for NWB status                            Cognition Arousal/Alertness: Awake/alert Behavior During Therapy: Flat affect Overall Cognitive Status: Within Functional Limits for tasks assessed                                          Exercises General Exercises - Lower Extremity Heel Slides: AAROM, Left, Supine, 10 reps Hip ABduction/ADduction: AROM, Left, Supine, 10 reps Straight Leg Raises: AAROM, Left, Supine, 10 reps    General Comments        Pertinent Vitals/Pain Pain Assessment Pain Assessment: No/denies pain    Home  Living                          Prior Function            PT Goals (current goals can now be found in the care plan section) Progress towards PT goals: Progressing toward goals    Frequency    Min 4X/week      PT Plan Current plan remains appropriate    Co-evaluation              AM-PAC PT "6 Clicks" Mobility   Outcome Measure  Help needed turning from your back  to your side while in a flat bed without using bedrails?: None Help needed moving from lying on your back to sitting on the side of a flat bed without using bedrails?: A Little Help needed moving to and from a bed to a chair (including a wheelchair)?: A Little Help needed standing up from a chair using your arms (e.g., wheelchair or bedside chair)?: A Little Help needed to walk in hospital room?: A Little Help needed climbing 3-5 steps with a railing? : A Little 6 Click Score: 19    End of Session Equipment Utilized During Treatment: Gait belt Activity Tolerance: Patient tolerated treatment well Patient left: in chair;with call bell/phone within reach;with chair alarm set Nurse Communication: Mobility status PT Visit Diagnosis: Other abnormalities of gait and mobility (R26.89);Muscle weakness (generalized) (M62.81);Pain     Time: 0720-0745 PT Time Calculation (min) (ACUTE ONLY): 25 min  Charges:  $Gait Training: 8-22 mins $Therapeutic Activity: 8-22 mins                     Bayard Males, PT Acute Rehabilitation Services Office: [phone]    [name] 04/12/2022, 7:49 AM

## 2022-04-12 NOTE — Progress Notes (Signed)
PROGRESS NOTE    Rachael Jordan  XNA:[id number] DOB: 1955/12/04 DOA: 04/09/2022 PCP: Sharyne Peach, MD   Brief Narrative:  67 y.o. female with medical history significant of chronic pain and HTN presented with a mechanical fall.  She was found to have left closed comminuted bicondylar tibial plateau fracture and admitted to [facility].  She was subsequently transferred to [facility] for orthopedics intervention.  Assessment & Plan:   Left closed comminuted bicondylar tibial plateau fracture after a mechanical fall -Orthopedics following: Status post surgical intervention with ORIF on 04/10/2022.  Wound care and DVT prophylaxis as per orthopedics recommendations.  Continue pain management.  Fall precautions. -PT/OT recommend home health PT/OT.  Patient is still in significant pain and is not ready to go home today.  Chronic pain -Continue current pain management.  Outpatient follow-up with pain management -Continue nortriptyline  Leukocytosis -Possibly reactive.  Still elevated but improving.  Most likely reactive.  Thrombocytosis -reactive.  Resolved  Normocytic anemia -Questionable cause.  Hemoglobin stable.  Hyponatremia -Resolved  Hypokalemia -Replace.  Repeat a.m. labs.  Essential hypertension -Continue amlodipine  Hyperlipidemia--continue statin  OSA on CPAP -Continue CPAP  Asthma--stable.  Continue albuterol as needed  History of urinary incontinence -Continue Myrbetriq    DVT prophylaxis: SCDs Code Status: Full Family Communication: None at bedside Disposition Plan: Status is: Inpatient Remains inpatient appropriate because: Of severity of illness  consultants: Orthopedics  Procedures: As above Antimicrobials: Perioperative  Subjective: Patient seen and examined at bedside.  Still complains of intermittent severe left lower extremity pain and not ready to go home today.  No fever, vomiting, chest pain reported.   Objective: Vitals:    04/11/22 1441 04/11/22 1647 04/11/22 2111 04/12/22 0550  BP: (!) 73/58 (!) 97/52 (!) 128/59 (!) 104/59  Pulse: 89 82 77 93  Resp:   18 16  Temp: 97.9 F (36.6 C)  98.5 F (36.9 C) 98.8 F (37.1 C)  TempSrc:    Oral  SpO2: 100% 100% 97% 95%  Weight:      Height:        Intake/Output Summary (Last 24 hours) at 04/12/2022 0910 Last data filed at 04/11/2022 1700 Gross per 24 hour  Intake 496.18 ml  Output --  Net 496.18 ml    Filed Weights   04/09/22 1610 04/10/22 0820  Weight: 70.8 kg 70.8 kg    Examination:  General: No acute distress.  Still on room air.   Respiratory: Bilateral decreased air sounds bases with scattered crackles  CVS: S1 and S2 are heard; rate controlled currently  abdominal: Soft, nontender, distended slightly; no organomegaly; bowel sounds heard extremities: Left knee is wrapped.  No lower extremity clubbing    Data Reviewed: I have personally reviewed following labs and imaging studies  CBC: Recent Labs  Lab 04/08/22 2010 04/10/22 0712 04/11/22 0643 04/12/22 0632  WBC 18.3* 14.2* 16.8* 15.9*  NEUTROABS 14.7*  --  13.0* 12.6*  HGB 11.4* 11.9* 10.6* 10.6*  HCT 34.1* 37.6 32.4* 33.0*  MCV 89.0 94.2 92.8 94.8  PLT 424* 394 303 542    Basic Metabolic Panel: Recent Labs  Lab 04/08/22 2010 04/10/22 0712 04/11/22 0643 04/12/22 0632  NA 137 134* 138 138  K 3.0* 3.3* 4.0 2.9*  CL 105 97* 102 103  CO2 20* '23 25 25  '$ GLUCOSE 118* 111* 112* 116*  BUN '10 16 11 11  '$ CREATININE 0.73 1.12* 0.91 0.96  CALCIUM 9.3 8.7* 8.6* 8.9  MG  --   --  2.3 2.2    GFR: Estimated Creatinine Clearance: 58.4 mL/min (by C-G formula based on SCr of 0.96 mg/dL). Liver Function Tests: No results for input(s): "AST", "ALT", "ALKPHOS", "BILITOT", "PROT", "ALBUMIN" in the last 168 hours. No results for input(s): "LIPASE", "AMYLASE" in the last 168 hours. No results for input(s): "AMMONIA" in the last 168 hours. Coagulation Profile: No results for input(s): "INR",  "PROTIME" in the last 168 hours. Cardiac Enzymes: No results for input(s): "CKTOTAL", "CKMB", "CKMBINDEX", "TROPONINI" in the last 168 hours. BNP (last 3 results) No results for input(s): "PROBNP" in the last 8760 hours. HbA1C: No results for input(s): "HGBA1C" in the last 72 hours. CBG: No results for input(s): "GLUCAP" in the last 168 hours. Lipid Profile: No results for input(s): "CHOL", "HDL", "LDLCALC", "TRIG", "CHOLHDL", "LDLDIRECT" in the last 72 hours. Thyroid Function Tests: No results for input(s): "TSH", "T4TOTAL", "FREET4", "T3FREE", "THYROIDAB" in the last 72 hours. Anemia Panel: No results for input(s): "VITAMINB12", "FOLATE", "FERRITIN", "TIBC", "IRON", "RETICCTPCT" in the last 72 hours. Sepsis Labs: No results for input(s): "PROCALCITON", "LATICACIDVEN" in the last 168 hours.  Recent Results (from the past 240 hour(s))  Surgical pcr screen     Status: Abnormal   Collection Time: 04/09/22 10:34 PM   Specimen: Nasal Mucosa; Nasal Swab  Result Value Ref Range Status   MRSA, PCR NEGATIVE NEGATIVE Final   Staphylococcus aureus POSITIVE (A) NEGATIVE Final    Comment: (NOTE) The Xpert SA Assay (FDA approved for NASAL specimens in patients 56 years of age and older), is one component of a comprehensive surveillance program. It is not intended to diagnose infection nor to guide or monitor treatment. Performed at [facility], [place] [address], [place], [place] [address]          Radiology Studies: DG Knee Left Port  Result Date: 04/10/2022 CLINICAL DATA:  Postop ORIF tibial plateau EXAM: PORTABLE LEFT KNEE - 2 VIEW COMPARISON:  None Available. FINDINGS: Postop changes from recent tibial plateau ORIF with grossly anatomic alignment. IMPRESSION: Status post tibial plateau ORIF. Electronically Signed   By: Sammie Bench M.D.   On: 04/10/2022 15:33   DG Knee Complete 4 Views Left  Result Date: 04/10/2022 CLINICAL DATA:  LEFT tibial plateau fracture EXAM: LEFT  KNEE - COMPLETE 4+ VIEW COMPARISON:  None Available. FINDINGS: Multiple spot intraoperative views of the knee provided. Dynamic plate fixation lateral tibial plateau fracture with fixation plate and multiple cortical screws. No complicating features IMPRESSION: ORIF LEFT to plateau fracture Electronically Signed   By: Suzy Bouchard M.D.   On: 04/10/2022 12:06   DG C-Arm 1-60 Min-No Report  Result Date: 04/10/2022 Fluoroscopy was utilized by the requesting physician.  No radiographic interpretation.        Scheduled Meds:  acetaminophen  1,000 mg Oral Q6H   amLODipine  5 mg Oral Daily   aspirin EC  81 mg Oral Daily   atorvastatin  20 mg Oral Daily   azelastine  1-2 spray Each Nare BID   docusate sodium  100 mg Oral BID   enoxaparin (LOVENOX) injection  40 mg Subcutaneous Q24H   gabapentin  800 mg Oral TID   loratadine  10 mg Oral Daily   mirabegron ER  50 mg Oral Daily   multivitamin with minerals  1 tablet Oral Daily   nortriptyline  10 mg Oral BID   pantoprazole  40 mg Oral Daily   Ensure Max Protein  11 oz Oral BID   traZODone  100  mg Oral QHS   Continuous Infusions:  sodium chloride 50 mL/hr at 04/10/22 2102          Aline August, MD [facility] 04/12/2022, 9:10 AM

## 2022-04-12 NOTE — TOC Progression Note (Signed)
Transition of Care [facility]) - Progression Note    Patient Details  Name: Rachael Jordan MRN: [id number] Date of Birth: 1955-04-05  Transition of Care [facility]) CM/SW Contact  Loletha Grayer Beverely Pace, RN Phone Number: 04/12/2022, 11:18 AM  Clinical Narrative:     67 yo female admitted 1/22 after falling over baby gate and landing on LLE with tibial plateau fx. 1/24 ORIF.   Case Manager spoke with patient concerning recommendation for Home Health therapies. Discussed getting agency that accepts her coverage. Referral called to [facility], Adela Lank. DME has been requested through [employer]. To be delivered to patient's room. She states since she has steps at her home, she will be going to her mom's home for recovery, [address], [place], [place] . Her cell is [phone].  Expected Discharge Plan: [place] Barriers to Discharge: No Barriers Identified  Expected Discharge Plan and Services In-house Referral: NA Discharge Planning Services: CM Consult Post Acute Care Choice: Durable Medical Equipment, Home Health Living arrangements for the past 2 months: Apartment (will be going home with her mom for recovery)                 DME Arranged: Bedside commode DME Agency: [employer] Date DME Agency Contacted: 04/12/22 Time DME Agency Contacted: 60 Representative spoke with at DME Agency: Erasmo Downer HH Arranged: PT, OT [place] Agency: [place] Date [place]: 04/12/22 Time [place]: 1030 Representative spoke with at [place]: [place] ([place]) Interventions SDOH Screenings   Food Insecurity: No Food Insecurity (04/11/2022)  Housing: Low Risk  (04/11/2022)  Transportation Needs: No Transportation Needs (04/11/2022)  Utilities: Not At Risk (04/11/2022)  Depression (PHQ2-9): Low Risk  (09/14/2020)  Tobacco Use: Low Risk  (04/11/2022)    Readmission Risk Interventions     No data to display

## 2022-04-12 NOTE — Discharge Instructions (Signed)
Orthopaedic Trauma Service Discharge Instructions   General Discharge Instructions  WEIGHT BEARING STATUS:Non-weightbearing left lower extremity  RANGE OF MOTION/ACTIVITY: ok for knee range of motion as tolerated  Wound Care: You may remove your surgical dressing. Incisions can be left open to air if there is no drainage. Once the incision is completely dry and without drainage, it may be left open to air out.  Showering may begin Sunday, (04/14/22).  Clean incision gently with soap and water.  DVT/PE prophylaxis: Aspirin 325 mg daily x 30 days  Diet: as you were eating previously.  Can use over the counter stool softeners and bowel preparations, such as Miralax, to help with bowel movements.  Narcotics can be constipating.  Be sure to drink plenty of fluids  PAIN MEDICATION USE AND EXPECTATIONS  You have likely been given narcotic medications to help control your pain.  After a traumatic event that results in an fracture (broken bone) with or without surgery, it is ok to use narcotic pain medications to help control one's pain.  We understand that everyone responds to pain differently and each individual patient will be evaluated on a regular basis for the continued need for narcotic medications. Ideally, narcotic medication use should last no more than 6-8 weeks (coinciding with fracture healing).   As a patient it is your responsibility as well to monitor narcotic medication use and report the amount and frequency you use these medications when you come to your office visit.   We would also advise that if you are using narcotic medications, you should take a dose prior to therapy to maximize you participation.  IF YOU ARE ON NARCOTIC MEDICATIONS IT IS NOT PERMISSIBLE TO OPERATE A MOTOR VEHICLE (MOTORCYCLE/CAR/TRUCK/MOPED) OR HEAVY MACHINERY DO NOT MIX NARCOTICS WITH OTHER CNS (CENTRAL NERVOUS SYSTEM) DEPRESSANTS SUCH AS ALCOHOL   STOP SMOKING OR USING NICOTINE PRODUCTS!!!!  As  discussed nicotine severely impairs your body's ability to heal surgical and traumatic wounds but also impairs bone healing.  Wounds and bone heal by forming microscopic blood vessels (angiogenesis) and nicotine is a vasoconstrictor (essentially, shrinks blood vessels).  Therefore, if vasoconstriction occurs to these microscopic blood vessels they essentially disappear and are unable to deliver necessary nutrients to the healing tissue.  This is one modifiable factor that you can do to dramatically increase your chances of healing your injury.    (This means no smoking, no nicotine gum, patches, etc)  DO NOT USE NONSTEROIDAL ANTI-INFLAMMATORY DRUGS (NSAID'S)  Using products such as Advil (ibuprofen), Aleve (naproxen), Motrin (ibuprofen) for additional pain control during fracture healing can delay and/or prevent the healing response.  If you would like to take over the counter (OTC) medication, Tylenol (acetaminophen) is ok.  However, some narcotic medications that are given for pain control contain acetaminophen as well. Therefore, you should not exceed more than 4000 mg of tylenol in a day if you do not have liver disease.  Also note that there are may OTC medicines, such as cold medicines and allergy medicines that my contain tylenol as well.  If you have any questions about medications and/or interactions please ask your doctor/PA or your pharmacist.      ICE AND ELEVATE INJURED/OPERATIVE EXTREMITY  Using ice and elevating the injured extremity above your heart can help with swelling and pain control.  Icing in a pulsatile fashion, such as 20 minutes on and 20 minutes off, can be followed.    Do not place ice directly on skin. Make sure there  is a barrier between to skin and the ice pack.    Using frozen items such as frozen peas works well as the conform nicely to the are that needs to be iced.  USE AN ACE WRAP OR TED HOSE FOR SWELLING CONTROL  In addition to icing and elevation, Ace wraps or TED  hose are used to help limit and resolve swelling.  It is recommended to use Ace wraps or TED hose until you are informed to stop.    When using Ace Wraps start the wrapping distally (farthest away from the body) and wrap proximally (closer to the body)   Example: If you had surgery on your leg or thing and you do not have a splint on, start the ace wrap at the toes and work your way up to the thigh        If you had surgery on your upper extremity and do not have a splint on, start the ace wrap at your fingers and work your way up to the upper arm  [place]: [phone]   VISIT OUR WEBSITE FOR ADDITIONAL INFORMATION: [email or web address]    Discharge Wound Care Instructions  Do NOT apply any ointments, solutions or lotions to pin sites or surgical wounds.  These prevent needed drainage and even though solutions like hydrogen peroxide kill bacteria, they also damage cells lining the pin sites that help fight infection.  Applying lotions or ointments can keep the wounds moist and can cause them to breakdown and open up as well. This can increase the risk for infection. When in doubt call the office.  If any drainage is noted, use one layer of adaptic or Mepitel, then gauze, Kerlix, and an ace wrap. - These dressing supplies should be available at local medical supply stores [facility], [facility], etc) as well as Management consultant ([employer], [facility], [facility], etc)  Once the incision is completely dry and without drainage, it may be left open to air out.  Showering may begin 36-48 hours later.  Cleaning gently with soap and water.

## 2022-04-13 LAB — CBC WITH DIFFERENTIAL/PLATELET
Abs Immature Granulocytes: 0.05 10*3/uL (ref 0.00–0.07)
Basophils Absolute: 0.1 10*3/uL (ref 0.0–0.1)
Basophils Relative: 1 %
Eosinophils Absolute: 0.4 10*3/uL (ref 0.0–0.5)
Eosinophils Relative: 4 %
HCT: 36.6 % (ref 36.0–46.0)
Hemoglobin: 11.1 g/dL — ABNORMAL LOW (ref 12.0–15.0)
Immature Granulocytes: 1 %
Lymphocytes Relative: 23 %
Lymphs Abs: 2.5 10*3/uL (ref 0.7–4.0)
MCH: 29.6 pg (ref 26.0–34.0)
MCHC: 30.3 g/dL (ref 30.0–36.0)
MCV: 97.6 fL (ref 80.0–100.0)
Monocytes Absolute: 1 10*3/uL (ref 0.1–1.0)
Monocytes Relative: 9 %
Neutro Abs: 6.8 10*3/uL (ref 1.7–7.7)
Neutrophils Relative %: 62 %
Platelets: 175 10*3/uL (ref 150–400)
RBC: 3.75 MIL/uL — ABNORMAL LOW (ref 3.87–5.11)
RDW: 13.4 % (ref 11.5–15.5)
WBC: 10.7 10*3/uL — ABNORMAL HIGH (ref 4.0–10.5)
nRBC: 0 % (ref 0.0–0.2)

## 2022-04-13 LAB — BASIC METABOLIC PANEL
Anion gap: 10 (ref 5–15)
BUN: 9 mg/dL (ref 8–23)
CO2: 23 mmol/L (ref 22–32)
Calcium: 8.8 mg/dL — ABNORMAL LOW (ref 8.9–10.3)
Chloride: 108 mmol/L (ref 98–111)
Creatinine, Ser: 0.85 mg/dL (ref 0.44–1.00)
GFR, Estimated: >60 mL/min (ref >60)
Glucose, Bld: 93 mg/dL (ref 70–99)
Potassium: 5 mmol/L (ref 3.5–5.1)
Sodium: 141 mmol/L (ref 135–145)

## 2022-04-13 LAB — MAGNESIUM: Magnesium: 1.9 mg/dL (ref 1.7–2.4)

## 2022-04-13 MED ORDER — LIDOCAINE 5 % EX PTCH
1.0000 | MEDICATED_PATCH | CUTANEOUS | Status: DC
  Administered 2022-04-13 – 2022-04-15 (×3): 1 via TRANSDERMAL
  Filled 2022-04-13 (×3): qty 1

## 2022-04-13 NOTE — Progress Notes (Signed)
PROGRESS NOTE    Rachael Jordan  OFH:[id number] DOB: [birth year] DOA: 04/09/2022 PCP: Sharyne Peach, MD   Brief Narrative:  67 y.o. female with medical history significant of chronic pain and HTN presented with a mechanical fall.  She was found to have left closed comminuted bicondylar tibial plateau fracture and admitted to [facility].  She was subsequently transferred to [facility] for orthopedics intervention.  Assessment & Plan:   Left closed comminuted bicondylar tibial plateau fracture after a mechanical fall -Orthopedics following: Status post surgical intervention with ORIF on 04/10/2022.  Wound care and DVT prophylaxis as per orthopedics recommendations.  Continue pain management.  Fall precautions. -PT/OT recommend home health PT/OT.  Still complains of significant pain requiring IV Dilaudid: Not ready to go home today.  Chronic pain -Continue current pain management.  Outpatient follow-up with pain management -Continue nortriptyline  Leukocytosis -Possibly reactive.  Resolved.  Most likely reactive.  Thrombocytosis -reactive.  Resolved  Normocytic anemia -Questionable cause.  Hemoglobin stable.  Hyponatremia -Resolved  Hypokalemia -Improved  Essential hypertension -Continue amlodipine  Hyperlipidemia--continue statin  OSA on CPAP -Continue CPAP  Asthma--stable.  Continue albuterol as needed  History of urinary incontinence -Continue Myrbetriq    DVT prophylaxis: SCDs Code Status: Full Family Communication: None at bedside Disposition Plan: Status is: Inpatient Remains inpatient appropriate because: Of severity of illness  consultants: Orthopedics  Procedures: As above Antimicrobials: Perioperative  Subjective: Patient seen and examined at bedside.  Complains of severe intermittent left lower extremity pain with intermittent nausea along with neck spasms.  Not ready to go home today.  No fever, chest pain or seizures reported.    Objective: Vitals:   04/12/22 2020 04/13/22 0432 04/13/22 0539 04/13/22 0748  BP: 121/68 123/70  136/71  Pulse: (!) 102 (!) 112 (!) 107 (!) 106  Resp: '18 16  17  '$ Temp: 99.4 F (37.4 C) 99.3 F (37.4 C)  98.5 F (36.9 C)  TempSrc: Oral Oral    SpO2: 97% 99% 97% 100%  Weight:      Height:        Intake/Output Summary (Last 24 hours) at 04/13/2022 0948 Last data filed at 04/12/2022 1207 Gross per 24 hour  Intake 357 ml  Output 1 ml  Net 356 ml    Filed Weights   04/09/22 1610 04/10/22 0820  Weight: 70.8 kg 70.8 kg    Examination:  General: On room air.  No distress.   Respiratory: Decreased breath sounds at bases bilaterally, no wheezing  CVS: Intermittently tachycardic; S1-S2 heard abdominal: Soft, nontender, mildly distended; no organomegaly; bowel sounds are heard normally  extremities: Left knee surgical wound is healing well.  No lower extremity cyanosis   Data Reviewed: I have personally reviewed following labs and imaging studies  CBC: Recent Labs  Lab 04/08/22 2010 04/10/22 0712 04/11/22 0643 04/12/22 0632 04/13/22 0445  WBC 18.3* 14.2* 16.8* 15.9* 10.7*  NEUTROABS 14.7*  --  13.0* 12.6* 6.8  HGB 11.4* 11.9* 10.6* 10.6* 11.1*  HCT 34.1* 37.6 32.4* 33.0* 36.6  MCV 89.0 94.2 92.8 94.8 97.6  PLT 424* 394 303 357 549    Basic Metabolic Panel: Recent Labs  Lab 04/08/22 2010 04/10/22 0712 04/11/22 0643 04/12/22 0632 04/13/22 0445  NA 137 134* 138 138 141  K 3.0* 3.3* 4.0 2.9* 5.0  CL 105 97* 102 103 108  CO2 20* '23 25 25 23  '$ GLUCOSE 118* 111* 112* 116* 93  BUN '10 16 11 11 9  '$ CREATININE 0.73 1.12*  0.91 0.96 0.85  CALCIUM 9.3 8.7* 8.6* 8.9 8.8*  MG  --   --  2.3 2.2 1.9    GFR: Estimated Creatinine Clearance: 66 mL/min (by C-G formula based on SCr of 0.85 mg/dL). Liver Function Tests: No results for input(s): "AST", "ALT", "ALKPHOS", "BILITOT", "PROT", "ALBUMIN" in the last 168 hours. No results for input(s): "LIPASE", "AMYLASE" in the last  168 hours. No results for input(s): "AMMONIA" in the last 168 hours. Coagulation Profile: No results for input(s): "INR", "PROTIME" in the last 168 hours. Cardiac Enzymes: No results for input(s): "CKTOTAL", "CKMB", "CKMBINDEX", "TROPONINI" in the last 168 hours. BNP (last 3 results) No results for input(s): "PROBNP" in the last 8760 hours. HbA1C: No results for input(s): "HGBA1C" in the last 72 hours. CBG: No results for input(s): "GLUCAP" in the last 168 hours. Lipid Profile: No results for input(s): "CHOL", "HDL", "LDLCALC", "TRIG", "CHOLHDL", "LDLDIRECT" in the last 72 hours. Thyroid Function Tests: No results for input(s): "TSH", "T4TOTAL", "FREET4", "T3FREE", "THYROIDAB" in the last 72 hours. Anemia Panel: No results for input(s): "VITAMINB12", "FOLATE", "FERRITIN", "TIBC", "IRON", "RETICCTPCT" in the last 72 hours. Sepsis Labs: No results for input(s): "PROCALCITON", "LATICACIDVEN" in the last 168 hours.  Recent Results (from the past 240 hour(s))  Surgical pcr screen     Status: Abnormal   Collection Time: 04/09/22 10:34 PM   Specimen: Nasal Mucosa; Nasal Swab  Result Value Ref Range Status   MRSA, PCR NEGATIVE NEGATIVE Final   Staphylococcus aureus POSITIVE (A) NEGATIVE Final    Comment: (NOTE) The Xpert SA Assay (FDA approved for NASAL specimens in patients 10 years of age and older), is one component of a comprehensive surveillance program. It is not intended to diagnose infection nor to guide or monitor treatment. Performed at [facility], [place] [address] [place], [place] [address]          Radiology Studies: No results found.      Scheduled Meds:  acetaminophen  1,000 mg Oral Q6H   amLODipine  5 mg Oral Daily   aspirin EC  81 mg Oral Daily   atorvastatin  20 mg Oral Daily   azelastine  1-2 spray Each Nare BID   Chlorhexidine Gluconate Cloth  6 each Topical Daily   docusate sodium  100 mg Oral BID   enoxaparin (LOVENOX) injection  40 mg  Subcutaneous Q24H   loratadine  10 mg Oral Daily   mirabegron ER  50 mg Oral Daily   multivitamin with minerals  1 tablet Oral Daily   mupirocin ointment  1 Application Nasal BID   nortriptyline  10 mg Oral BID   pantoprazole  40 mg Oral Daily   Ensure Max Protein  11 oz Oral BID   traZODone  100 mg Oral QHS   Continuous Infusions:          Aline August, MD [facility] 04/13/2022, 9:48 AM

## 2022-04-13 NOTE — Progress Notes (Signed)
Physical Therapy Treatment Patient Details Name: Rachael Jordan MRN: [id number] DOB: 1955/11/27 Today's Date: 04/13/2022   History of Present Illness 67 yo female admitted 1/22 after falling over baby gate and landing on LLE with tibial plateau fx. 1/24 ORIF. PMhx: anxiety, asthma, CVA, scoliosis    PT Comments    Pt pleasant and reports decreased jerking but increased pain and nausea with fatigue and nausea limiting session. Pt continues to get up to [facility] for voiding and can only tolerate limited gait. Pt educated for HEP and will continue to follow.     Recommendations for follow up therapy are one component of a multi-disciplinary discharge planning process, led by the attending physician.  Recommendations may be updated based on patient status, additional functional criteria and insurance authorization.  Follow Up Recommendations  Home health PT     Assistance Recommended at Discharge Intermittent Supervision/Assistance  Patient can return home with the following A little help with walking and/or transfers;A little help with bathing/dressing/bathroom;Assist for transportation;Assistance with cooking/housework   Equipment Recommendations  Rolling walker (2 wheels);BSC/3in1    Recommendations for Other Services       Precautions / Restrictions Precautions Precautions: Fall Restrictions Weight Bearing Restrictions: Yes LLE Weight Bearing: Non weight bearing     Mobility  Bed Mobility Overal bed mobility: Modified Independent       Supine to sit: HOB elevated     General bed mobility comments: HOB 20 degrees without physical assist    Transfers Overall transfer level: Needs assistance   Transfers: Sit to/from Stand, Bed to chair/wheelchair/BSC Sit to Stand: Supervision Stand pivot transfers: Min guard         General transfer comment: supervision for safety with good hand placement. stood x 3 trials and pivot bed to bSC with RW     Ambulation/Gait Ambulation/Gait assistance: Min guard Gait Distance (Feet): 35 Feet Assistive device: Rolling walker (2 wheels) Gait Pattern/deviations: Step-to pattern   Gait velocity interpretation: 1.31 - 2.62 ft/sec, indicative of limited community ambulator   General Gait Details: pt maintaining NWB and step to pattern however gait limited by fatigue and nausea unable to progress further with close chair follow.   Stairs             Wheelchair Mobility    Modified Rankin (Stroke Patients Only)       Balance Overall balance assessment: Needs assistance   Sitting balance-Leahy Scale: Good     Standing balance support: Reliant on assistive device for balance, Bilateral upper extremity supported Standing balance-Leahy Scale: Poor Standing balance comment: reliant on RW for NWB status                            Cognition Arousal/Alertness: Awake/alert Behavior During Therapy: Flat affect Overall Cognitive Status: Within Functional Limits for tasks assessed                                          Exercises General Exercises - Lower Extremity Heel Slides: AAROM, Left, Supine, 10 reps    General Comments        Pertinent Vitals/Pain Pain Assessment Pain Score: 7  Pain Location: LLE and noted cramping Rt thigh Pain Descriptors / Indicators: Aching, Cramping Pain Intervention(s): Limited activity within patient's tolerance, Repositioned, Monitored during session, Premedicated before session    Home Living  Prior Function            PT Goals (current goals can now be found in the care plan section) Progress towards PT goals: Progressing toward goals (slowly)    Frequency    Min 4X/week      PT Plan Current plan remains appropriate    Co-evaluation              AM-PAC PT "6 Clicks" Mobility   Outcome Measure  Help needed turning from your back to your side while in a  flat bed without using bedrails?: None Help needed moving from lying on your back to sitting on the side of a flat bed without using bedrails?: A Little Help needed moving to and from a bed to a chair (including a wheelchair)?: A Little Help needed standing up from a chair using your arms (e.g., wheelchair or bedside chair)?: A Little Help needed to walk in hospital room?: A Little Help needed climbing 3-5 steps with a railing? : A Little 6 Click Score: 19    End of Session Equipment Utilized During Treatment: Gait belt Activity Tolerance: Patient limited by fatigue Patient left: in chair;with call bell/phone within reach;with chair alarm set Nurse Communication: Mobility status PT Visit Diagnosis: Other abnormalities of gait and mobility (R26.89);Muscle weakness (generalized) (M62.81);Pain     Time: 7195-9747 PT Time Calculation (min) (ACUTE ONLY): 18 min  Charges:  $Gait Training: 8-22 mins                     Bayard Males, PT Acute Rehabilitation Services Office: [place] 04/13/2022, 12:21 PM

## 2022-04-14 MED ORDER — POLYETHYLENE GLYCOL 3350 17 G PO PACK: 17.0000 g | PACK | Freq: Every day | ORAL | 0 refills | Status: AC | PRN

## 2022-04-14 MED ORDER — SENNA 8.6 MG PO TABS: 1.0000 | ORAL_TABLET | Freq: Two times a day (BID) | ORAL | 0 refills | Status: DC

## 2022-04-14 NOTE — Progress Notes (Signed)
Lower ba ck and surgical site pain rated c8/10. Oxy codone administered and reminded her allergy lists. Pt states she was allergy to oxy codone before but not anymore.

## 2022-04-14 NOTE — TOC Transition Note (Signed)
Transition of Care [facility]) - CM/SW Discharge Note   Patient Details  Name: Rachael Jordan MRN: [id number] Date of Birth: January 15, 1956  [facility] CM/SW Contact:  Carles Collet, RN Phone Number: 04/14/2022, 10:32 AM   Clinical Narrative:     Notified [place] agency of DC. DME requested to be delivered to the room 1/26. No other TOC needs identified at this time.  Final next level of care: [place] Barriers to Discharge: No Barriers Identified   Patient Goals and CMS Choice   Choice offered to / list presented to : Patient  Discharge Placement                         Discharge Plan and Services Additional resources added to the After Visit Summary for   In-house Referral: NA Discharge Planning Services: CM Consult Post Acute Care Choice: Durable Medical Equipment, Home Health          DME Arranged: Bedside commode DME Agency: [employer] Date DME Agency Contacted: 04/12/22 Time DME Agency Contacted: [id number] Representative spoke with at DME Agency: Erasmo Downer HH Arranged: PT, OT [place] Agency: [place] Date [place]: 04/14/22 Time [place]: [id number] Representative spoke with at [place]: [place]) Interventions SDOH Screenings   Food Insecurity: No Food Insecurity (04/11/2022)  Housing: Low Risk  (04/11/2022)  Transportation Needs: No Transportation Needs (04/11/2022)  Utilities: Not At Risk (04/11/2022)  Depression (PHQ2-9): Low Risk  (09/14/2020)  Tobacco Use: Low Risk  (04/11/2022)     Readmission Risk Interventions     No data to display

## 2022-04-14 NOTE — Discharge Summary (Signed)
Physician Discharge Summary  Rachael Jordan OHY:[id number] DOB: Mar 06, 1956 DOA: 04/09/2022  PCP: Sharyne Peach, MD  Admit date: 04/09/2022 Discharge date: 04/14/2022  Admitted From: Home Disposition: Home  Recommendations for Outpatient Follow-up:  Follow up with PCP in 1 week  Outpatient follow-up with orthopedics.  Discharge wound care/DVT prophylaxis/pain management as per orthopedics recommendations. Follow up in ED if symptoms worsen or new appear   Home Health: No Equipment/Devices: None  Discharge Condition: Stable CODE STATUS: Full Diet recommendation: Heart healthy  Brief/Interim Summary: 67 y.o. female with medical history significant of chronic pain and HTN presented with a mechanical fall.  She was found to have left closed comminuted bicondylar tibial plateau fracture and admitted to [facility].  She was subsequently transferred to [facility] for orthopedics intervention.  She underwent surgical intervention on 04/10/2022.  Subsequently, PT/OT recommended home health PT/OT.  She will be discharged home today with outpatient follow-up with PCP and orthopedics.  Discharge Diagnoses:   Left closed comminuted bicondylar tibial plateau fracture after a mechanical fall -Orthopedics following: Status post surgical intervention with ORIF on 04/10/2022.  Wound care and DVT prophylaxis as per orthopedics recommendations.  Continue pain management.  Fall precautions. -PT/OT recommend home health PT/OT.  She will be discharged home today with outpatient follow-up with PCP and orthopedics.   Chronic pain -Continue current pain management.  Outpatient follow-up with pain management -Continue nortriptyline   Leukocytosis -Possibly reactive.  Resolved.     Thrombocytosis -reactive.  Resolved   Normocytic anemia -Questionable cause.  Hemoglobin stable.   Hyponatremia -Resolved   Hypokalemia -Improved   Essential hypertension -Continue amlodipine    Hyperlipidemia--continue statin   OSA on CPAP -Continue CPAP   Asthma--stable.  Continue albuterol as needed   History of urinary incontinence -Continue Myrbetriq  Discharge Instructions  Discharge Instructions     Diet - low sodium heart healthy   Complete by: As directed    Increase activity slowly   Complete by: As directed       Allergies as of 04/14/2022       Reactions   Grapefruit Extract Rash   Morphine And Related Itching   Oxycodone Itching   Oxymorphone Itching   Propoxyphene Nausea And Vomiting, Nausea Only        Medication List     STOP taking these medications    gabapentin 800 MG tablet Commonly known as: NEURONTIN   oxyCODONE-acetaminophen 7.5-325 MG tablet Commonly known as: Percocet       TAKE these medications    acetaminophen 500 MG tablet Commonly known as: TYLENOL Take 2 tablets (1,000 mg total) by mouth every 6 (six) hours as needed for mild pain, moderate pain, headache or fever.   albuterol (2.5 MG/3ML) 0.083% nebulizer solution Commonly known as: PROVENTIL Take 2.5 mg by nebulization every 6 (six) hours as needed for wheezing or shortness of breath.   albuterol 108 (90 Base) MCG/ACT inhaler Commonly known as: VENTOLIN HFA Inhale 2 puffs into the lungs every 6 (six) hours as needed for wheezing or shortness of breath.   amLODipine 5 MG tablet Commonly known as: NORVASC Take 5 mg by mouth daily.   ammonium lactate 12 % lotion Commonly known as: AmLactin Apply 1 application topically as needed for dry skin.   ascorbic acid 500 MG tablet Commonly known as: VITAMIN C Take 500 mg by mouth daily.   aspirin EC 325 MG tablet Take 1 tablet (325 mg total) by mouth daily. What changed:  medication strength  how much to take   atorvastatin 20 MG tablet Commonly known as: LIPITOR Take 20 mg by mouth daily.   azelastine 0.1 % nasal spray Commonly known as: ASTELIN Place 1-2 sprays into both nostrils 2 (two) times  daily.   B-12 1000 MCG Tabs Take by mouth daily.   Biotin 10000 MCG Tabs Take 10 mg by mouth daily.   CENTRUM ADULTS PO Take 1 tablet by mouth daily.   cetirizine 10 MG tablet Commonly known as: ZYRTEC Take 10 mg by mouth at bedtime.   Cholecalciferol 25 MCG (1000 UT) tablet Take 1,000 Units by mouth daily.   ciclopirox 8 % solution Commonly known as: Penlac Apply topically at bedtime. Apply over nail and surrounding skin. Apply daily over previous coat. After seven (7) days, may remove with alcohol and continue cycle.   diclofenac Sodium 1 % Gel Commonly known as: VOLTAREN Apply topically.   lidocaine 5 % Commonly known as: Lidoderm Place 1 patch onto the skin daily. Remove & Discard patch within 12 hours or as directed by MD   mirabegron ER 50 MG Tb24 tablet Commonly known as: MYRBETRIQ Take 1 tablet (50 mg total) by mouth daily.   naloxone 4 MG/0.1ML Liqd nasal spray kit Commonly known as: NARCAN Take as prescribed for opoid overdose What changed: Another medication with the same name was removed. Continue taking this medication, and follow the directions you see here.   nortriptyline 10 MG capsule Commonly known as: PAMELOR Take 10 mg by mouth 2 (two) times daily.   oxyCODONE 15 MG immediate release tablet Commonly known as: Roxicodone Take 1 tablet (15 mg total) by mouth every 6 (six) hours as needed for severe pain.   pantoprazole 40 MG tablet Commonly known as: PROTONIX Take 40 mg by mouth daily.   polyethylene glycol 17 g packet Commonly known as: MIRALAX / GLYCOLAX Take 17 g by mouth daily as needed for mild constipation.   promethazine 25 MG tablet Commonly known as: PHENERGAN Take 1 tablet (25 mg total) by mouth every 8 (eight) hours as needed for nausea or vomiting.   pyridOXINE 100 MG tablet Commonly known as: VITAMIN B6 Take 100 mg by mouth daily.   rizatriptan 10 MG tablet Commonly known as: MAXALT Take 10 mg by mouth as needed for  migraine. May repeat in 2 hours if needed   senna 8.6 MG Tabs tablet Commonly known as: SENOKOT Take 1 tablet (8.6 mg total) by mouth 2 (two) times daily.   tiZANidine 4 MG tablet Commonly known as: ZANAFLEX Take 1 tablet (4 mg total) by mouth every 6 (six) hours as needed for muscle spasms. What changed:  how much to take when to take this reasons to take this   traZODone 100 MG tablet Commonly known as: DESYREL Take 1 tablet (100 mg total) by mouth at bedtime.   valACYclovir 1000 MG tablet Commonly known as: VALTREX Take 1,000 mg by mouth daily as needed.   Vitamin E 45 MG Caps Take 90 mg by mouth daily.               Durable Medical Equipment  (From admission, onward)           Start     Ordered   04/12/22 1114  For home use only DME Bedside commode  Once       Question:  Patient needs a bedside commode to treat with the following condition  Answer:  Generalized weakness   04/12/22 1114  04/12/22 1114  For home use only DME Walker rolling  Once       Question Answer Comment  Walker: With 5 Inch Wheels   Patient needs a walker to treat with the following condition Generalized weakness   Patient needs a walker to treat with the following condition Left tibial fracture      04/12/22 1114            Follow-up Information     Haddix, Thomasene Lot, MD. Schedule an appointment as soon as possible for a visit in 2 week(s).   Specialty: Orthopedic Surgery Why: for wound check and repeat x-rays Contact information: [place] [address] [phone]         Care, [facility] Follow up.   Specialty: [place] Why: A representative from [facility] will contact you to arrange start date and time for your therapy. Contact information: [place] [address] [phone]         Sharyne Peach, MD. Schedule an appointment as soon as possible for a visit in 1 week(s).   Specialty:  Family Medicine Contact information: [place] [address] [phone]                Allergies  Allergen Reactions   Grapefruit Extract Rash   Morphine And Related Itching   Oxycodone Itching   Oxymorphone Itching   Propoxyphene Nausea And Vomiting and Nausea Only    Consultations: Orthopedics   Procedures/Studies: DG Knee Left Port  Result Date: 04/10/2022 CLINICAL DATA:  Postop ORIF tibial plateau EXAM: PORTABLE LEFT KNEE - 2 VIEW COMPARISON:  None Available. FINDINGS: Postop changes from recent tibial plateau ORIF with grossly anatomic alignment. IMPRESSION: Status post tibial plateau ORIF. Electronically Signed   By: Sammie Bench M.D.   On: 04/10/2022 15:33   DG Knee Complete 4 Views Left  Result Date: 04/10/2022 CLINICAL DATA:  LEFT tibial plateau fracture EXAM: LEFT KNEE - COMPLETE 4+ VIEW COMPARISON:  None Available. FINDINGS: Multiple spot intraoperative views of the knee provided. Dynamic plate fixation lateral tibial plateau fracture with fixation plate and multiple cortical screws. No complicating features IMPRESSION: ORIF LEFT to plateau fracture Electronically Signed   By: Suzy Bouchard M.D.   On: 04/10/2022 12:06   DG C-Arm 1-60 Min-No Report  Result Date: 04/10/2022 Fluoroscopy was utilized by the requesting physician.  No radiographic interpretation.   CT Hip Left Wo Contrast  Result Date: 04/08/2022 CLINICAL DATA:  Trauma, fall EXAM: CT OF THE LEFT HIP WITHOUT CONTRAST TECHNIQUE: Multidetector CT imaging of the left hip was performed according to the standard protocol. Multiplanar CT image reconstructions were also generated. RADIATION DOSE REDUCTION: This exam was performed according to the departmental dose-optimization program which includes automated exposure control, adjustment of the mA and/or kV according to patient size and/or use of iterative reconstruction technique. COMPARISON:  None Available. FINDINGS:  Bones/Joint/Cartilage No fracture or malalignment. Left SI joint is non widened. Pubic symphysis and rami appear intact. No significant hip effusion. The joint space appears patent. Ligaments Suboptimally assessed by CT. Muscles and Tendons No significant atrophy.  No intramuscular fluid collections Soft tissues Negative IMPRESSION: No CT evidence for acute osseous abnormality of the left hip. Electronically Signed   By: Donavan Foil M.D.   On: 04/08/2022 18:59   CT Knee Left Wo Contrast  Result Date: 04/08/2022 CLINICAL DATA:  Knee injury EXAM: CT OF THE LEFT KNEE  WITHOUT CONTRAST TECHNIQUE: Multidetector CT imaging of the left knee was performed according to the standard protocol. Multiplanar CT image reconstructions were also generated. RADIATION DOSE REDUCTION: This exam was performed according to the departmental dose-optimization program which includes automated exposure control, adjustment of the mA and/or kV according to patient size and/or use of iterative reconstruction technique. COMPARISON:  Radiograph 04/08/2022. FINDINGS: Bones/Joint/Cartilage Acute comminuted intra-articular proximal tibial fracture for involves both the medial and lateral tibial plateaus with fracture lucency extending through the tibial spines. About 5 mm depression of the lateral tibial plateau fracture. About 2 mm depression of the medial tibial plateau. Nondisplaced cortical fracture lucency at the posterior to posterolateral cortex of tibia extends to at least the proximal shaft, incomplete visualization of distal extent. No definitive fibular or patellar fracture. Large lipohemarthrosis. Ligaments Suboptimally assessed by CT. Muscles and Tendons No significant atrophy. No intramuscular fluid collections. Patellar and quadriceps tendons appear grossly intact. Soft tissues Soft tissue swelling about the knee. Punctate focus of gas within the soft tissues lateral side of the knee at the level of patella, no obvious open wound  in the vicinity. IMPRESSION: 1. Acute comminuted intra-articular proximal tibial fracture involves both the medial and lateral tibial plateaus with extension of lucency through the tibial spines. Lateral greater than medial depression of fracture fragments as above. Nondisplaced fracture lucency is visualized at the posterior to posterolateral cortex of the tibia to the level of the proximal shaft but the inferior extent is incompletely visualized. There is associated large lipohemarthrosis. Electronically Signed   By: Donavan Foil M.D.   On: 04/08/2022 18:57   DG Knee Left Port  Result Date: 04/08/2022 CLINICAL DATA:  Fall EXAM: PORTABLE LEFT KNEE - 1-2 VIEW COMPARISON:  None Available. FINDINGS: There is a comminuted fracture of the lateral tibial plateau. This extends into the tibial spines in his depressed proximally 1 cm. A nondisplaced oblique fracture fragment is seen through the proximal lateral diaphysis of the tibia. There is 6 mm of posterior offset of fracture fragments. There is no evidence for dislocation. Lipohemarthrosis is present. IMPRESSION: 1. Comminuted fracture of the lateral tibial plateau with extension into the tibial spines and proximal diaphysis. 2. Lipohemarthrosis. Electronically Signed   By: Ronney Asters M.D.   On: 04/08/2022 17:33      Subjective: Patient seen and examined at bedside.  Complains of intermittent left lower extremity pain but feels okay to go home today.  Complains of some abdominal pain.  No fever or chest pain or shortness of breath reported. Discharge Exam: Vitals:   04/14/22 0456 04/14/22 0844  BP: 133/69 132/77  Pulse: 99 (!) 109  Resp: 16 20  Temp: 98.2 F (36.8 C) 98.9 F (37.2 C)  SpO2: 99% 97%    General: Pt is alert, awake, not in acute distress.  On room air. Cardiovascular: Mild intermittent tachycardia present; S1/S2 + Respiratory: bilateral decreased breath sounds at bases Abdominal: Soft, NT, ND, bowel sounds + Extremities: Left  knee surgical wound is healing well.  No lower extremity cyanosis      The results of significant diagnostics from this hospitalization (including imaging, microbiology, ancillary and laboratory) are listed below for reference.     Microbiology: Recent Results (from the past 240 hour(s))  Surgical pcr screen     Status: Abnormal   Collection Time: 04/09/22 10:34 PM   Specimen: Nasal Mucosa; Nasal Swab  Result Value Ref Range Status   MRSA, PCR NEGATIVE NEGATIVE Final   Staphylococcus aureus POSITIVE (  A) NEGATIVE Final    Comment: (NOTE) The Xpert SA Assay (FDA approved for NASAL specimens in patients 82 years of age and older), is one component of a comprehensive surveillance program. It is not intended to diagnose infection nor to guide or monitor treatment. Performed at [facility], [place] [address] [place], [place] [address]      Labs: BNP (last 3 results) No results for input(s): "BNP" in the last 8760 hours. Basic Metabolic Panel: Recent Labs  Lab 04/08/22 2010 04/10/22 0712 04/11/22 0643 04/12/22 0632 04/13/22 0445  NA 137 134* 138 138 141  K 3.0* 3.3* 4.0 2.9* 5.0  CL 105 97* 102 103 108  CO2 20* '23 25 25 23  '$ GLUCOSE 118* 111* 112* 116* 93  BUN '10 16 11 11 9  '$ CREATININE 0.73 1.12* 0.91 0.96 0.85  CALCIUM 9.3 8.7* 8.6* 8.9 8.8*  MG  --   --  2.3 2.2 1.9   Liver Function Tests: No results for input(s): "AST", "ALT", "ALKPHOS", "BILITOT", "PROT", "ALBUMIN" in the last 168 hours. No results for input(s): "LIPASE", "AMYLASE" in the last 168 hours. No results for input(s): "AMMONIA" in the last 168 hours. CBC: Recent Labs  Lab 04/08/22 2010 04/10/22 0712 04/11/22 0643 04/12/22 0632 04/13/22 0445  WBC 18.3* 14.2* 16.8* 15.9* 10.7*  NEUTROABS 14.7*  --  13.0* 12.6* 6.8  HGB 11.4* 11.9* 10.6* 10.6* 11.1*  HCT 34.1* 37.6 32.4* 33.0* 36.6  MCV 89.0 94.2 92.8 94.8 97.6  PLT 424* 394 303 357 175   Cardiac Enzymes: No results for input(s): "CKTOTAL",  "CKMB", "CKMBINDEX", "TROPONINI" in the last 168 hours. BNP: Invalid input(s): "POCBNP" CBG: No results for input(s): "GLUCAP" in the last 168 hours. D-Dimer No results for input(s): "DDIMER" in the last 72 hours. Hgb A1c No results for input(s): "HGBA1C" in the last 72 hours. Lipid Profile No results for input(s): "CHOL", "HDL", "LDLCALC", "TRIG", "CHOLHDL", "LDLDIRECT" in the last 72 hours. Thyroid function studies No results for input(s): "TSH", "T4TOTAL", "T3FREE", "THYROIDAB" in the last 72 hours.  Invalid input(s): "FREET3" Anemia work up No results for input(s): "VITAMINB12", "FOLATE", "FERRITIN", "TIBC", "IRON", "RETICCTPCT" in the last 72 hours. Urinalysis    Component Value Date/Time   COLORURINE YELLOW (A) 12/30/2019 1217   APPEARANCEUR Clear 12/07/2020 0854   LABSPEC 1.023 12/30/2019 1217   LABSPEC 1.009 06/27/2014 1414   PHURINE 5.0 12/30/2019 1217   GLUCOSEU Negative 12/07/2020 0854   GLUCOSEU Negative 06/27/2014 1414   HGBUR NEGATIVE 12/30/2019 1217   BILIRUBINUR Negative 12/07/2020 0854   BILIRUBINUR Negative 06/27/2014 1414   KETONESUR 5 (A) 12/30/2019 1217   PROTEINUR Negative 12/07/2020 0854   PROTEINUR 30 (A) 12/30/2019 1217   NITRITE Negative 12/07/2020 0854   NITRITE NEGATIVE 12/30/2019 1217   LEUKOCYTESUR Negative 12/07/2020 0854   LEUKOCYTESUR NEGATIVE 12/30/2019 1217   LEUKOCYTESUR Negative 06/27/2014 1414   Sepsis Labs Recent Labs  Lab 04/10/22 0712 04/11/22 0643 04/12/22 0632 04/13/22 0445  WBC 14.2* 16.8* 15.9* 10.7*   Microbiology Recent Results (from the past 240 hour(s))  Surgical pcr screen     Status: Abnormal   Collection Time: 04/09/22 10:34 PM   Specimen: Nasal Mucosa; Nasal Swab  Result Value Ref Range Status   MRSA, PCR NEGATIVE NEGATIVE Final   Staphylococcus aureus POSITIVE (A) NEGATIVE Final    Comment: (NOTE) The Xpert SA Assay (FDA approved for NASAL specimens in patients 64 years of age and older), is one component  of a comprehensive surveillance program.  It is not intended to diagnose infection nor to guide or monitor treatment. Performed at [facility], [place] [address] [place], [place] [address]      Time coordinating discharge: 35 minutes  SIGNED:   Aline August, MD  [facility] 04/14/2022, 10:29 AM

## 2022-04-14 NOTE — Progress Notes (Signed)
Placed patient on CPAP for the night via auto-mode.  

## 2022-04-15 ENCOUNTER — Telehealth: Payer: Self-pay | Admitting: *Deleted

## 2022-04-15 NOTE — Telephone Encounter (Addendum)
Patient is calling to let the physician know that she fell last week caring grand baby, injuring 3 bones left knee, (postsurgery foot),just getting out of hospital, was going to PT @[facility]  ,is having in home PT in the home for her knee,can they both be combined? Patient is now in a lot pain, nothing given is helping, called hospital to switch to something. Please advise.

## 2022-04-15 NOTE — Progress Notes (Signed)
Patient was supposed be discharged on 04/14/2022 but she did not feel ready to go home because of severe pain.  She feels better today and feels okay to go home.  Patient seen and examined at bedside.  Please refer to the full discharge summary done by me on 04/14/2022 for full details.

## 2022-04-15 NOTE — Progress Notes (Signed)
CSW spoke with Maudie Mercury at [facility] who states the patient's bedside commode and walker were delivered to the room on 04/12/2022 at 2:05pm.  Madilyn Fireman, MSW, LCSW Transitions of Care  Clinical Social Worker II [phone]

## 2022-04-15 NOTE — Progress Notes (Signed)
Physical Therapy Treatment Patient Details Name: Rachael Jordan MRN: [id number] DOB: December 04, 1955 Today's Date: 04/15/2022   History of Present Illness 67 yo female admitted 1/22 after falling over baby gate and landing on LLE with tibial plateau fx. 1/24 ORIF. PMhx: anxiety, asthma, CVA, scoliosis    PT Comments    Pt pleasant and demonstrating increased ability with sit to stand transfers and limited progression with gait. Pt reports she still intends to go to mother's house and does not want to discuss rehab. Pt encouraged to maintain knee extension at rest and continue HEP throughout the day. Will continue to follow.     Recommendations for follow up therapy are one component of a multi-disciplinary discharge planning process, led by the attending physician.  Recommendations may be updated based on patient status, additional functional criteria and insurance authorization.  Follow Up Recommendations  Home health PT     Assistance Recommended at Discharge Intermittent Supervision/Assistance  Patient can return home with the following A little help with walking and/or transfers;A little help with bathing/dressing/bathroom;Assist for transportation;Assistance with cooking/housework   Equipment Recommendations  Rolling walker (2 wheels);BSC/3in1    Recommendations for Other Services       Precautions / Restrictions Precautions Precautions: Fall     Mobility  Bed Mobility Overal bed mobility: Modified Independent             General bed mobility comments: HOB 25 degrees without physical assist, rail and increased time    Transfers Overall transfer level: Needs assistance   Transfers: Sit to/from Stand Sit to Stand: Supervision           General transfer comment: pt with good hand placement and control with trials x 3    Ambulation/Gait Ambulation/Gait assistance: Min guard Gait Distance (Feet): 30 Feet Assistive device: Rolling walker (2 wheels) Gait  Pattern/deviations: Step-to pattern   Gait velocity interpretation: 1.31 - 2.62 ft/sec, indicative of limited community ambulator   General Gait Details: pt maintaining NWB and step to pattern however gait limited by fatigue. Pt performed 2 trials of 30' with chair follow   Stairs             Wheelchair Mobility    Modified Rankin (Stroke Patients Only)       Balance Overall balance assessment: Needs assistance   Sitting balance-Leahy Scale: Good     Standing balance support: Reliant on assistive device for balance, Bilateral upper extremity supported Standing balance-Leahy Scale: Poor Standing balance comment: reliant on RW for NWB status                            Cognition Arousal/Alertness: Awake/alert Behavior During Therapy: Flat affect Overall Cognitive Status: Within Functional Limits for tasks assessed                                 General Comments: reports some issues with STM at times        Exercises General Exercises - Lower Extremity Ankle Circles/Pumps: AROM, 15 reps, Seated, Left Long Arc Quad: AAROM, Left, Seated, 10 reps Straight Leg Raises: AAROM, Left, Supine, 10 reps Hip Flexion/Marching: AROM, Left, Seated, 10 reps    General Comments        Pertinent Vitals/Pain Pain Assessment Pain Score: 7  Pain Location: LLE Pain Descriptors / Indicators: Sore, Aching Pain Intervention(s): Limited activity within patient's tolerance, Monitored during session, Premedicated before  session, Repositioned    Home Living                          Prior Function            PT Goals (current goals can now be found in the care plan section) Progress towards PT goals: Progressing toward goals    Frequency    Min 4X/week      PT Plan Current plan remains appropriate    Co-evaluation              AM-PAC PT "6 Clicks" Mobility   Outcome Measure  Help needed turning from your back to your side  while in a flat bed without using bedrails?: None Help needed moving from lying on your back to sitting on the side of a flat bed without using bedrails?: A Little Help needed moving to and from a bed to a chair (including a wheelchair)?: A Little Help needed standing up from a chair using your arms (e.g., wheelchair or bedside chair)?: A Little Help needed to walk in hospital room?: A Little Help needed climbing 3-5 steps with a railing? : A Little 6 Click Score: 19    End of Session Equipment Utilized During Treatment: Gait belt Activity Tolerance: Patient tolerated treatment well Patient left: in chair;with call bell/phone within reach;with chair alarm set Nurse Communication: Mobility status PT Visit Diagnosis: Other abnormalities of gait and mobility (R26.89);Muscle weakness (generalized) (M62.81);Pain     Time: 0726-0752 PT Time Calculation (min) (ACUTE ONLY): 26 min  Charges:  $Gait Training: 8-22 mins $Therapeutic Activity: 8-22 mins                     Bayard Males, PT Acute Rehabilitation Services Office: [place] 04/15/2022, 7:59 AM

## 2022-04-15 NOTE — Progress Notes (Addendum)
Alert and oriented. Reported 6/10 pain to lower back and surgical site. Received oxycodone 15 mg at 0642. Surgical pain tender and swelling +1. She was walking on the hallway with PT using walker around 0830. Took all her schedule medications. Discharged home after CN reviewed her dc paperwork.

## 2022-04-23 ENCOUNTER — Other Ambulatory Visit: Payer: Self-pay | Admitting: Podiatry

## 2022-04-23 DIAGNOSIS — M21962 Unspecified acquired deformity of left lower leg: Secondary | ICD-10-CM

## 2022-04-26 NOTE — Telephone Encounter (Signed)
Spoke with patient to update that the referral has been placed for in home PT , ok per physician to add foot with knee home health , verbalized understanding , will call back with the name of agency , thinks it may be [facility] but not sure yet.

## 2022-04-27 DIAGNOSIS — G4733 Obstructive sleep apnea (adult) (pediatric): Secondary | ICD-10-CM | POA: Diagnosis not present

## 2022-04-27 DIAGNOSIS — J4530 Mild persistent asthma, uncomplicated: Secondary | ICD-10-CM | POA: Diagnosis not present

## 2022-04-29 ENCOUNTER — Telehealth: Payer: Self-pay | Admitting: *Deleted

## 2022-04-29 NOTE — Telephone Encounter (Signed)
Faxed PT referral to [facility], confirmation received 04/29/22.

## 2022-05-02 ENCOUNTER — Encounter: Payer: 59 | Admitting: Podiatry

## 2022-05-02 DIAGNOSIS — Z7982 Long term (current) use of aspirin: Secondary | ICD-10-CM | POA: Diagnosis not present

## 2022-05-02 DIAGNOSIS — Z9181 History of falling: Secondary | ICD-10-CM | POA: Diagnosis not present

## 2022-05-02 DIAGNOSIS — S82142D Displaced bicondylar fracture of left tibia, subsequent encounter for closed fracture with routine healing: Secondary | ICD-10-CM | POA: Diagnosis not present

## 2022-05-04 DIAGNOSIS — S82142D Displaced bicondylar fracture of left tibia, subsequent encounter for closed fracture with routine healing: Secondary | ICD-10-CM | POA: Diagnosis not present

## 2022-05-04 DIAGNOSIS — Z7982 Long term (current) use of aspirin: Secondary | ICD-10-CM | POA: Diagnosis not present

## 2022-05-04 DIAGNOSIS — Z9181 History of falling: Secondary | ICD-10-CM | POA: Diagnosis not present

## 2022-05-06 DIAGNOSIS — J4530 Mild persistent asthma, uncomplicated: Secondary | ICD-10-CM | POA: Diagnosis not present

## 2022-05-06 DIAGNOSIS — G4733 Obstructive sleep apnea (adult) (pediatric): Secondary | ICD-10-CM | POA: Diagnosis not present

## 2022-05-16 ENCOUNTER — Ambulatory Visit (INDEPENDENT_AMBULATORY_CARE_PROVIDER_SITE_OTHER): Payer: 59 | Admitting: Podiatry

## 2022-05-16 ENCOUNTER — Ambulatory Visit (INDEPENDENT_AMBULATORY_CARE_PROVIDER_SITE_OTHER): Payer: 59

## 2022-05-16 DIAGNOSIS — M21962 Unspecified acquired deformity of left lower leg: Secondary | ICD-10-CM

## 2022-05-16 DIAGNOSIS — M79672 Pain in left foot: Secondary | ICD-10-CM

## 2022-05-16 DIAGNOSIS — Z9889 Other specified postprocedural states: Secondary | ICD-10-CM

## 2022-05-16 DIAGNOSIS — S82892A Other fracture of left lower leg, initial encounter for closed fracture: Secondary | ICD-10-CM | POA: Diagnosis not present

## 2022-05-16 NOTE — Progress Notes (Signed)
Subjective: varicose veins  Rachael Jordan is a 67 y.o. is seen today in office s/p left foot excision soft tissue mass, decompression neuroma 1st and 2nd interspace, third metatarsal osteotomy preformed on 04/04/2021.  Since I saw her last she had a fall and was treated had a tibial plateau fracture which she is still nonweightbearing from.  Since I saw her last her third toe was sitting down and she has been having increased pain to the ankle since her fall.  Objective: General: No acute distress, AAOx3  DP/PT pulses palpable 2/4, CRT < 3 sec to all digits.  Protective sensation intact. Motor function intact.  LEFT foot: Incision is well coapted without any evidence of dehiscence.  Scars are well-formed.  The third toe does still plantarflex and there is decreased range of motion of the 3rd MPJ. There is edema present to the ankle and there is tenderness palpation of the ankle diffusely.  No pain with calf compression, swelling, warmth, erythema.   Assessment and Plan:  Status post left foot surgery; left ankle fracture  -Treatment options discussed including all alternatives, risks, and complications -X-rays obtained reviewed.  There is increased consolidation noted along the third metatarsal osteotomy.  On the lateral view there was fibular fracture noted so prior obtained ankle x-rays which confirmed fibular fracture. -Unfortunately since the injury she had has not been doing range of motion or walking and I do think that the osteotomy healed dorsiflex which is limiting her movement.  Will do physical therapy to try to work on range of motion.  Will continue to have revision of this but for now we will wait till after her injury has healed. -She has a follow-up with Dr. Doreatha Martin next week and will defer to him for treatment of ankle fracture.  I did try to mobilize this today but this was hurting the knee.  She is still nonweightbearing.  Discussed importance of this until follow-up with Dr.  Doreatha Martin. -Pain medication as needed.  She does follow-up with pain clinic. -Will get updated orders to Rachael Jordan) to work range of motion of the toes only.  Trula Slade DPM

## 2022-05-20 ENCOUNTER — Telehealth: Payer: Self-pay

## 2022-05-20 NOTE — Telephone Encounter (Signed)
Pt calls triage line and states that she is currently non mobile due to knee and ankle fracture. She states that she is unable to afford Myrbetriq. She states she has tried oxybutynin, trospium, and darifenacin with intolerable side effects. She is unable to come in for an appointment but is willing to do a mychart video visit. She states she is up "every minute to pee". Please advise on next steps.

## 2022-05-22 ENCOUNTER — Ambulatory Visit: Payer: 59

## 2022-05-22 NOTE — Telephone Encounter (Signed)
Talked with patient and made appt with Dr Matilde Sprang

## 2022-05-24 DIAGNOSIS — Z9181 History of falling: Secondary | ICD-10-CM | POA: Diagnosis not present

## 2022-05-24 DIAGNOSIS — Z7982 Long term (current) use of aspirin: Secondary | ICD-10-CM | POA: Diagnosis not present

## 2022-05-24 DIAGNOSIS — S82142D Displaced bicondylar fracture of left tibia, subsequent encounter for closed fracture with routine healing: Secondary | ICD-10-CM | POA: Diagnosis not present

## 2022-05-30 DIAGNOSIS — Z9181 History of falling: Secondary | ICD-10-CM | POA: Diagnosis not present

## 2022-05-30 DIAGNOSIS — S82142D Displaced bicondylar fracture of left tibia, subsequent encounter for closed fracture with routine healing: Secondary | ICD-10-CM | POA: Diagnosis not present

## 2022-05-30 DIAGNOSIS — Z7982 Long term (current) use of aspirin: Secondary | ICD-10-CM | POA: Diagnosis not present

## 2022-06-03 DIAGNOSIS — Z7982 Long term (current) use of aspirin: Secondary | ICD-10-CM | POA: Diagnosis not present

## 2022-06-03 DIAGNOSIS — S82142D Displaced bicondylar fracture of left tibia, subsequent encounter for closed fracture with routine healing: Secondary | ICD-10-CM | POA: Diagnosis not present

## 2022-06-03 DIAGNOSIS — Z9181 History of falling: Secondary | ICD-10-CM | POA: Diagnosis not present

## 2022-06-05 ENCOUNTER — Ambulatory Visit: Payer: 59

## 2022-06-05 DIAGNOSIS — Z9181 History of falling: Secondary | ICD-10-CM | POA: Diagnosis not present

## 2022-06-05 DIAGNOSIS — Z7982 Long term (current) use of aspirin: Secondary | ICD-10-CM | POA: Diagnosis not present

## 2022-06-05 DIAGNOSIS — S82142D Displaced bicondylar fracture of left tibia, subsequent encounter for closed fracture with routine healing: Secondary | ICD-10-CM | POA: Diagnosis not present

## 2022-06-06 ENCOUNTER — Ambulatory Visit (INDEPENDENT_AMBULATORY_CARE_PROVIDER_SITE_OTHER): Payer: 59 | Admitting: Podiatry

## 2022-06-06 ENCOUNTER — Ambulatory Visit (INDEPENDENT_AMBULATORY_CARE_PROVIDER_SITE_OTHER): Payer: 59

## 2022-06-06 DIAGNOSIS — Z9889 Other specified postprocedural states: Secondary | ICD-10-CM | POA: Diagnosis not present

## 2022-06-06 DIAGNOSIS — M21962 Unspecified acquired deformity of left lower leg: Secondary | ICD-10-CM

## 2022-06-08 NOTE — Progress Notes (Signed)
Subjective: Chief Complaint  Patient presents with   Post-op Follow-up    Patient is in no pain and says she's doing well    Rachael Jordan is a 67 y.o. is seen today in office s/p left foot excision soft tissue mass, decompression neuroma 1st and 2nd interspace, third metatarsal osteotomy preformed on 04/04/2021.  She has follow-up with orthopedics with ankle fracture and this is being treated conservatively.  She been doing physical therapy for her toes and has been getting somewhat better but she still gets pain to the third toe that she reports.  No new injuries or concerns otherwise.   Objective: General: No acute distress, AAOx3  DP/PT pulses palpable 2/4, CRT < 3 sec to all digits.  Protective sensation intact. Motor function intact.  LEFT foot: Incision is well coapted without any evidence of dehiscence.  There is increased range of motion of the third MPJ but she still gets tenderness on the MPJ with range of motion.  There is no other significant areas of discomfort today. No pain with calf compression, swelling, warmth, erythema.   Assessment and Plan:  Status post left foot surgery; left ankle fracture  -Treatment options discussed including all alternatives, risks, and complications -X-rays obtained reviewed.  There is increased consolidation noted along the third metatarsal osteotomy.  Bone callus formation is present.  No evidence of acute fracture otherwise.  Healing fibular fracture noted on lateral view. -Range of motion is actually improved to the third toe.  Continue physical therapy for now discussed that she may need to have a revision surgery at some point but for now factors other injuries as a long-term we may need to consider other treatment for her foot.  Trula Slade DPM

## 2022-06-10 ENCOUNTER — Ambulatory Visit: Payer: 59 | Admitting: Physician Assistant

## 2022-06-13 ENCOUNTER — Ambulatory Visit: Payer: 59 | Admitting: Physician Assistant

## 2022-06-13 DIAGNOSIS — Z9181 History of falling: Secondary | ICD-10-CM | POA: Diagnosis not present

## 2022-06-13 DIAGNOSIS — S82142D Displaced bicondylar fracture of left tibia, subsequent encounter for closed fracture with routine healing: Secondary | ICD-10-CM | POA: Diagnosis not present

## 2022-06-13 DIAGNOSIS — Z7982 Long term (current) use of aspirin: Secondary | ICD-10-CM | POA: Diagnosis not present

## 2022-07-01 ENCOUNTER — Encounter: Payer: Self-pay | Admitting: Urology

## 2022-07-01 ENCOUNTER — Ambulatory Visit: Payer: 59 | Admitting: Urology

## 2022-07-08 ENCOUNTER — Other Ambulatory Visit: Payer: Self-pay

## 2022-07-08 DIAGNOSIS — R0602 Shortness of breath: Secondary | ICD-10-CM

## 2022-07-10 ENCOUNTER — Ambulatory Visit: Payer: 59 | Admitting: Internal Medicine

## 2022-07-10 ENCOUNTER — Telehealth: Payer: Self-pay | Admitting: Internal Medicine

## 2022-07-10 NOTE — Telephone Encounter (Signed)
Left vm and sent mychart message to confirm 07/17/22 appointment-Toni

## 2022-07-17 ENCOUNTER — Ambulatory Visit: Payer: Medicare HMO | Admitting: Internal Medicine

## 2022-08-08 ENCOUNTER — Ambulatory Visit (INDEPENDENT_AMBULATORY_CARE_PROVIDER_SITE_OTHER): Payer: Self-pay

## 2022-08-08 ENCOUNTER — Ambulatory Visit (INDEPENDENT_AMBULATORY_CARE_PROVIDER_SITE_OTHER): Payer: Self-pay | Admitting: Podiatry

## 2022-08-08 DIAGNOSIS — B351 Tinea unguium: Secondary | ICD-10-CM

## 2022-08-08 DIAGNOSIS — Z9889 Other specified postprocedural states: Secondary | ICD-10-CM

## 2022-08-08 DIAGNOSIS — M21962 Unspecified acquired deformity of left lower leg: Secondary | ICD-10-CM

## 2022-08-08 MED ORDER — CICLOPIROX 8 % EX SOLN: Freq: Every day | CUTANEOUS | 0 refills | Status: DC

## 2022-08-08 NOTE — Progress Notes (Signed)
Subjective: No chief complaint on file.    Rachael Jordan is a 67 y.o. is seen today in office today for follow evaluation of left foot pain.  She is s/p left foot excision soft tissue mass, decompression neuroma 1st and 2nd interspace, third metatarsal osteotomy preformed on 01/02/2022.  This was complicated postop from a fall for which she had a tibial plateau fracture.  She is now back to walking.  Foot is feeling a little better but most of her discomfort is in the ankle and she is currently treated by orthopedics with a fracture.  Objective: General: No acute distress, AAOx3  DP/PT pulses palpable 2/4, CRT < 3 sec to all digits.  Protective sensation intact. Motor function intact.  LEFT foot: Incision is well coapted without any evidence of dehiscence.  There is decreased tenderness.  There is some minimal edema.  There is no erythema or warmth.  Clinically no significant prominence plantarly and her symptoms have improved. Hallux nail appears to be growing out there are some clearing on the proximal nail fold. No pain with calf compression, swelling, warmth, erythema.   Assessment and Plan:  Status post left foot surgery; left ankle fracture; onychomycosis  -Treatment options discussed including all alternatives, risks, and complications -X-rays obtained reviewed.  There is increased consolidation noted along the third metatarsal osteotomy.  Metatarsal appears to be in better alignment. -Overall she does seem to making good progress and she is back to walking with a cane.  Continue with shoes, good support and offloading.  At this point without any further surgical intervention and her symptoms are improving. -Refilled Penlac- she does not remember getting this previously.   No follow-ups on file.  Vivi Barrack DPM

## 2022-08-28 ENCOUNTER — Ambulatory Visit (INDEPENDENT_AMBULATORY_CARE_PROVIDER_SITE_OTHER): Payer: Medicare HMO | Admitting: Internal Medicine

## 2022-08-28 DIAGNOSIS — R0602 Shortness of breath: Secondary | ICD-10-CM

## 2022-09-09 ENCOUNTER — Encounter: Payer: Self-pay | Admitting: Internal Medicine

## 2022-09-09 ENCOUNTER — Ambulatory Visit (INDEPENDENT_AMBULATORY_CARE_PROVIDER_SITE_OTHER): Payer: Medicare HMO | Admitting: Internal Medicine

## 2022-09-09 VITALS — BP 140/90 | HR 107 | Temp 97.6°F | Resp 16 | Ht 65.0 in | Wt 162.4 lb

## 2022-09-09 DIAGNOSIS — Z7189 Other specified counseling: Secondary | ICD-10-CM

## 2022-09-09 DIAGNOSIS — G4733 Obstructive sleep apnea (adult) (pediatric): Secondary | ICD-10-CM | POA: Diagnosis not present

## 2022-09-09 DIAGNOSIS — J4520 Mild intermittent asthma, uncomplicated: Secondary | ICD-10-CM

## 2022-09-09 NOTE — Patient Instructions (Signed)

## 2022-09-09 NOTE — Progress Notes (Signed)
[facility] [address] [place], [place] [address]  Pulmonary Sleep Medicine   Office Visit Note  Patient Name: Rachael Jordan DOB: [birth year] MRN [id number]  Date of Service: 09/09/2022  Complaints/HPI: She is doing well overall. She states she did take a fall and sustained fractures of multiple bones. Has no admissions to the hospital. She states she has no cough congestion sputum. No chest pain noted. Her asthma is under control. She is on her CPAP as ordered. She has been off since the fall but will go back on it  Office Spirometry Results:     ROS  General: (-) fever, (-) chills, (-) night sweats, (-) weakness Skin: (-) rashes, (-) itching,. Eyes: (-) visual changes, (-) redness, (-) itching. Nose and Sinuses: (-) nasal stuffiness or itchiness, (-) postnasal drip, (-) nosebleeds, (-) sinus trouble. Mouth and Throat: (-) sore throat, (-) hoarseness. Neck: (-) swollen glands, (-) enlarged thyroid, (-) neck pain. Respiratory: - cough, (-) bloody sputum, - shortness of breath, - wheezing. Cardiovascular: - ankle swelling, (-) chest pain. Lymphatic: (-) lymph node enlargement. Neurologic: (-) numbness, (-) tingling. Psychiatric: (-) anxiety, (-) depression   Current Medication: Outpatient Encounter Medications as of 09/09/2022  Medication Sig   acetaminophen (TYLENOL) 500 MG tablet Take 2 tablets (1,000 mg total) by mouth every 6 (six) hours as needed for mild pain, moderate pain, headache or fever.   albuterol (PROVENTIL) (2.5 MG/3ML) 0.083% nebulizer solution Take 2.5 mg by nebulization every 6 (six) hours as needed for wheezing or shortness of breath.   albuterol (VENTOLIN HFA) 108 (90 Base) MCG/ACT inhaler Inhale 2 puffs into the lungs every 6 (six) hours as needed for wheezing or shortness of breath.   amLODipine (NORVASC) 5 MG tablet Take 5 mg by mouth daily.   ammonium lactate (AMLACTIN) 12 % lotion Apply 1 application topically as needed for dry skin.    atorvastatin (LIPITOR) 20 MG tablet Take 20 mg by mouth daily.   azelastine (ASTELIN) 0.1 % nasal spray Place 1-2 sprays into both nostrils 2 (two) times daily.    Biotin 95284 MCG TABS Take 10 mg by mouth daily.    budesonide-formoterol (SYMBICORT) 80-4.5 MCG/ACT inhaler Inhale 2 puffs into the lungs 2 (two) times daily.   cetirizine (ZYRTEC) 10 MG tablet Take 10 mg by mouth at bedtime.    Cholecalciferol 25 MCG (1000 UT) tablet Take 1,000 Units by mouth daily.    ciclopirox (PENLAC) 8 % solution Apply topically at bedtime. Apply over nail and surrounding skin. Apply daily over previous coat. After seven (7) days, may remove with alcohol and continue cycle.   Cyanocobalamin (B-12) 1000 MCG TABS Take by mouth daily.   diclofenac Sodium (VOLTAREN) 1 % GEL Apply topically.   lidocaine (LIDODERM) 5 % Place 1 patch onto the skin daily. Remove & Discard patch within 12 hours or as directed by MD   mirabegron ER (MYRBETRIQ) 50 MG TB24 tablet Take 1 tablet (50 mg total) by mouth daily.   montelukast (SINGULAIR) 10 MG tablet Take 10 mg by mouth at bedtime.   Multiple Vitamins-Minerals (CENTRUM ADULTS PO) Take 1 tablet by mouth daily.    naloxone (NARCAN) nasal spray 4 mg/0.1 mL Take as prescribed for opoid overdose   nortriptyline (PAMELOR) 10 MG capsule Take 10 mg by mouth 2 (two) times daily.   oxyCODONE (ROXICODONE) 15 MG immediate release tablet Take 1 tablet (15 mg total) by mouth every 6 (six) hours as needed for severe pain.  pantoprazole (PROTONIX) 40 MG tablet Take 40 mg by mouth daily.   polyethylene glycol (MIRALAX / GLYCOLAX) 17 g packet Take 17 g by mouth daily as needed for mild constipation.   promethazine (PHENERGAN) 25 MG tablet Take 1 tablet (25 mg total) by mouth every 8 (eight) hours as needed for nausea or vomiting.   pyridOXINE (VITAMIN B-6) 100 MG tablet Take 100 mg by mouth daily.   rizatriptan (MAXALT) 10 MG tablet Take 10 mg by mouth as needed for migraine. May repeat in 2  hours if needed   senna (SENOKOT) 8.6 MG TABS tablet Take 1 tablet (8.6 mg total) by mouth 2 (two) times daily.   tiZANidine (ZANAFLEX) 4 MG tablet Take 1 tablet (4 mg total) by mouth every 6 (six) hours as needed for muscle spasms.   traZODone (DESYREL) 100 MG tablet Take 1 tablet (100 mg total) by mouth at bedtime.   valACYclovir (VALTREX) 1000 MG tablet Take 1,000 mg by mouth daily as needed.   vitamin C (ASCORBIC ACID) 500 MG tablet Take 500 mg by mouth daily.   Vitamin E 45 MG CAPS Take 90 mg by mouth daily.   No facility-administered encounter medications on file as of 09/09/2022.    Surgical History: Past Surgical History:  Procedure Laterality Date   ABDOMINAL HYSTERECTOMY     vaginal   APPENDECTOMY     BREAST BIOPSY     COLONOSCOPY N/A 01/02/2020   Procedure: COLONOSCOPY;  Surgeon: Toledo, Boykin Nearing, MD;  Location: [facility] ENDOSCOPY;  Service: Gastroenterology;  Laterality: N/A;   COLONOSCOPY WITH PROPOFOL N/A 12/29/2018   Procedure: COLONOSCOPY WITH PROPOFOL;  Surgeon: Midge Minium, MD;  Location: [facility] ENDOSCOPY;  Service: Endoscopy;  Laterality: N/A;   ESOPHAGOGASTRODUODENOSCOPY N/A 01/02/2020   Procedure: ESOPHAGOGASTRODUODENOSCOPY (EGD);  Surgeon: Toledo, Boykin Nearing, MD;  Location: [facility] ENDOSCOPY;  Service: Gastroenterology;  Laterality: N/A;   FOOT SURGERY Left    10/2015   NECK SURGERY     ORIF TIBIA PLATEAU Left 04/10/2022   Procedure: OPEN REDUCTION INTERNAL FIXATION (ORIF) TIBIAL PLATEAU VERSUS;  Surgeon: Roby Lofts, MD;  Location: [facility] OR;  Service: Orthopedics;  Laterality: Left;    Medical History: Past Medical History:  Diagnosis Date   Acid reflux    Anxiety    Arthritis    Asthma    Bronchitis 09/2015   CVA (cerebral vascular accident) (HCC)    Gross hematuria    H. pylori infection    Heart murmur    Kidney failure    Labile essential hypertension 01/06/2018   Lung abnormality    "damaged lung due to pneumonia"   Neck pain    Nephrolithiasis     Pneumonia    Pneumothorax    Scoliosis    Seasonal allergies    Urinary frequency    Vaginal atrophy     Family History: Family History  Problem Relation Age of Onset   Arthritis Mother    Asthma Mother    Hyperlipidemia Mother    Hypertension Mother    Diabetes Sister    Cancer Maternal Aunt        esophagus   Breast cancer Neg Hx    Kidney cancer Neg Hx    Bladder Cancer Neg Hx     Social History: Social History   Socioeconomic History   Marital status: Legally Separated    Spouse name: Not on file   Number of children: Not on file   Years of education: Not on file  Highest education level: Not on file  Occupational History   Not on file  Tobacco Use   Smoking status: Never   Smokeless tobacco: Never  Vaping Use   Vaping Use: Never used  Substance and Sexual Activity   Alcohol use: No    Alcohol/week: 0.0 standard drinks of alcohol   Drug use: No   Sexual activity: Not on file  Other Topics Concern   Not on file  Social History Narrative   Not on file   Social Determinants of Health   Financial Resource Strain: Not on file  Food Insecurity: No Food Insecurity (04/11/2022)   Hunger Vital Sign    Worried About Running Out of Food in the Last Year: Never true    Ran Out of Food in the Last Year: Never true  Transportation Needs: No Transportation Needs (04/11/2022)   PRAPARE - Administrator, Civil Service (Medical): No    Lack of Transportation (Non-Medical): No  Physical Activity: Not on file  Stress: Not on file  Social Connections: Not on file  Intimate Partner Violence: Not At Risk (04/11/2022)   Humiliation, Afraid, Rape, and Kick questionnaire    Fear of Current or Ex-Partner: No    Emotionally Abused: No    Physically Abused: No    Sexually Abused: No    Vital Signs: Blood pressure (!) 140/90, pulse (!) 107, temperature 97.6 F (36.4 C), resp. rate 16, height 5\' 5"  (1.651 m), weight 162 lb 6.4 oz (73.7 kg), SpO2 97  %.  Examination: General Appearance: The patient is well-developed, well-nourished, and in no distress. Skin: Gross inspection of skin unremarkable. Head: normocephalic, no gross deformities. Eyes: no gross deformities noted. ENT: ears appear grossly normal no exudates. Neck: Supple. No thyromegaly. No LAD. Respiratory: no ronchi noted. Cardiovascular: Normal S1 and S2 without murmur or rub. Extremities: No cyanosis. pulses are equal. Neurologic: Alert and oriented. No involuntary movements.  LABS: No results found for this or any previous visit (from the past 2160 hour(s)).  Radiology: DG Knee Left Port  Result Date: 04/10/2022 CLINICAL DATA:  Postop ORIF tibial plateau EXAM: PORTABLE LEFT KNEE - 2 VIEW COMPARISON:  None Available. FINDINGS: Postop changes from recent tibial plateau ORIF with grossly anatomic alignment. IMPRESSION: Status post tibial plateau ORIF. Electronically Signed   By: Layla Maw M.D.   On: 04/10/2022 15:33   DG Knee Complete 4 Views Left  Result Date: 04/10/2022 CLINICAL DATA:  LEFT tibial plateau fracture EXAM: LEFT KNEE - COMPLETE 4+ VIEW COMPARISON:  None Available. FINDINGS: Multiple spot intraoperative views of the knee provided. Dynamic plate fixation lateral tibial plateau fracture with fixation plate and multiple cortical screws. No complicating features IMPRESSION: ORIF LEFT to plateau fracture Electronically Signed   By: Genevive Bi M.D.   On: 04/10/2022 12:06   DG C-Arm 1-60 Min-No Report  Result Date: 04/10/2022 Fluoroscopy was utilized by the requesting physician.  No radiographic interpretation.    No results found.  No results found.  Assessment and Plan: Patient Active Problem List   Diagnosis Date Noted   Tibial plateau fracture, left, closed, initial encounter 04/09/2022   Dyslipidemia 04/09/2022   Urinary incontinence 04/09/2022   Bilateral closed proximal tibial fracture 04/08/2022   OSA on CPAP 04/08/2022   Asthma  04/08/2022   Neuropathic pain of right lower extremity 04/16/2021   Ischemic colitis (HCC)    Rectal bleeding    Left lower quadrant abdominal pain    Lower GI bleed  Hypotension    Acute colitis 12/30/2019   Lumbar herniated disc 06/16/2019   Acute nonintractable headache    Multiple falls 02/20/2019   Myoclonic jerking 02/20/2019   COPD (chronic obstructive pulmonary disease) (HCC) 02/20/2019   Seasonal allergies 02/20/2019   Insomnia 02/20/2019   Depression with anxiety 02/20/2019   AKI (acute kidney injury) (HCC) 02/20/2019   Dizziness 02/18/2019   Encounter for screening colonoscopy    Bunion 05/06/2018   Metatarsalgia of right foot 05/06/2018   Age-related osteoporosis without current pathological fracture 03/31/2018   False positive ana 03/31/2018   Mouth dryness 02/18/2018   Screening for osteoporosis 02/18/2018   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/2018   Essential hypertension 01/06/2018   Lacunar infarction (HCC) 01/06/2018   Compression fracture of thoracic vertebra, sequela 07/12/2017   Lumbar compression fracture, sequela 07/12/2017   Nocturnal hypoxia 07/12/2017   Simple chronic bronchitis (HCC) 07/12/2017   Intractable back pain 06/05/2017   Overweight (BMI 25.0-29.9) 04/08/2017   Chronic pain syndrome 04/06/2017   Sensory disturbance 01/08/2017   Pain in the groin, right 10/28/2016   Paresthesia of both hands 12/14/2015   B12 deficiency 10/31/2015   SOB (shortness of breath) 08/29/2015   History of hematuria 07/12/2015   Urinary frequency 07/12/2015   Vaginal atrophy 07/12/2015   HAV (hallux abducto valgus) 07/02/2015   Overlapping toe 07/02/2015   Hammertoe 07/02/2015   LBP (low back pain) 01/17/2015   Trochanteric bursitis of right hip 01/17/2015   PNA (pneumonia) 12/28/2014   DDD (degenerative disc disease), lumbar 08/15/2014   Chronic radicular lumbar pain 08/15/2014   Spinal stenosis, lumbar region, with neurogenic claudication  08/15/2014   DDD (degenerative disc disease), cervical 08/15/2014   Bilateral occipital neuralgia 08/15/2014   Migraine 08/15/2014   Sacroiliac joint dysfunction 08/15/2014   Back pain, chronic 09/23/2013   Chronic cervical pain 09/23/2013   Arthralgia of temporomandibular joint 08/29/2013   Absolute anemia 08/15/2013   Chronic kidney disease (CKD), stage III (moderate) (HCC) 08/15/2013   Abnormal serum level of alkaline phosphatase 08/15/2013   Blush 08/15/2013   Hematuria, microscopic 08/15/2013   Inflamed nasal mucosa 08/15/2013   Big thyroid 08/15/2013   Disease of thyroid gland 08/15/2013   Positive H. pylori test 08/15/2013   Gastrointestinal ulcer due to Helicobacter pylori 08/14/2013   Other specified bacterial intestinal infections 08/14/2013   Acute kidney injury superimposed on CKD (HCC) 08/09/2013   SIRS (systemic inflammatory response syndrome) (HCC) 08/09/2013   Systemic inflammatory response syndrome (SIRS) (HCC) 08/09/2013    1. Mild intermittent asthma without complication Appears to be under good control patient is going to continue with the current regimen.  She has not had any flareups or exacerbations.  No admissions for acute asthmatic attacks.  Medications reviewed with the patient  2. Obstructive sleep apnea She she will continue on CPAP therapy states she is doing well with the current pressure settings max settings.  She will follow-up with her DME provider for any other issues that may come up  3. CPAP use counseling CPAP Counseling: had a lengthy discussion with the patient regarding the importance of PAP therapy in management of the sleep apnea. Patient appears to understand the risk factor reduction and also understands the risks associated with untreated sleep apnea.   General Counseling: I have discussed the findings of the evaluation and examination with Blessyn.  I have also discussed any further diagnostic evaluation thatmay be needed or ordered  today. Ginny verbalizes understanding of the findings of todays visit.  We also reviewed her medications today and discussed drug interactions and side effects including but not limited excessive drowsiness and altered mental states. We also discussed that there is always a risk not just to her but also people around her. she has been encouraged to call the office with any questions or concerns that should arise related to todays visit.  No orders of the defined types were placed in this encounter.    Time spent: 28  I have personally obtained a history, examined the patient, evaluated laboratory and imaging results, formulated the assessment and plan and placed orders.    Yevonne Pax, MD [facility] Pulmonary and Critical Care Sleep medicine

## 2022-09-12 NOTE — Procedures (Signed)
[facility] [address] [place], [address]    Complete Pulmonary Function Testing Interpretation:  FINDINGS:  The forced vital capacity is mildly decreased.  FEV1 is normal FEV1 FVC ratio is normal.  Postbronchodilator no significant change in FEV1 is noted.  Total lung capacity is mildly decreased.  Residual volume is normal.  Residual on bronchitis ratio is increased.  FRC is normal.  DLCO is normal.  IMPRESSION:  This pulmonary function study is consistent with mild restrictive lung disease  Yevonne Pax, MD [facility] Pulmonary Critical Care Medicine Sleep Medicine

## 2022-09-16 LAB — PULMONARY FUNCTION TEST

## 2022-10-08 ENCOUNTER — Encounter: Payer: Medicaid Other | Admitting: Podiatry

## 2022-10-29 ENCOUNTER — Encounter: Payer: Self-pay | Admitting: Podiatry

## 2022-11-22 ENCOUNTER — Ambulatory Visit (INDEPENDENT_AMBULATORY_CARE_PROVIDER_SITE_OTHER): Payer: Self-pay

## 2022-11-22 ENCOUNTER — Encounter: Payer: Self-pay | Admitting: Podiatry

## 2022-11-22 ENCOUNTER — Ambulatory Visit (INDEPENDENT_AMBULATORY_CARE_PROVIDER_SITE_OTHER): Payer: Self-pay | Admitting: Podiatry

## 2022-11-22 DIAGNOSIS — S82892D Other fracture of left lower leg, subsequent encounter for closed fracture with routine healing: Secondary | ICD-10-CM

## 2022-11-22 DIAGNOSIS — M7751 Other enthesopathy of right foot: Secondary | ICD-10-CM

## 2022-11-22 DIAGNOSIS — M21962 Unspecified acquired deformity of left lower leg: Secondary | ICD-10-CM

## 2022-11-22 DIAGNOSIS — M778 Other enthesopathies, not elsewhere classified: Secondary | ICD-10-CM

## 2022-11-22 NOTE — Progress Notes (Unsigned)
Subjective: Chief Complaint  Patient presents with   Routine Post Op    DOS 01/02/2022 - s/p left foot excision soft tissue mass, decompression neuroma 1st and 2nd interspace, third metatarsal osteotomy   "My left one has been bothering me a little more"   Foot Pain    Plantar forefoot right - burning x 2 months     Rachael Jordan is a 67 y.o. is seen today in office today for follow evaluation of left foot pain.  She states she is getting burning pain to both of her feet.  She has been having ongoing issues with her left leg after she had the fracture, injury.  She states the right foot is also starting to hurt.  No recent any new injuries to the left foot or right foot.  She has recently started wearing help orthotics again.   Objective: General: No acute distress, AAOx3  DP/PT pulses palpable 2/4, CRT < 3 sec to all digits.  Protective sensation intact. Motor function intact.  LEFT foot: Incision is well coapted without any evidence of dehiscence.  Scar is well-formed.  She has tenderness along the second interspace.  She also gets similar symptoms second interspace as well as submetatarsal 2 on the right foot.  The right second toe overlaps the hallux on the right foot.  Unable to appreciate any area of pinpoint tenderness.  There is significant edema there is no erythema.  No pain with calf compression, swelling, warmth, erythema.   Assessment and Plan:  Capsulitis, neuritis bilaterally  -Treatment options discussed including all alternatives, risks, and complications -X-rays obtained reviewed.  There is increased consolidation noted along the third metatarsal osteotomy.  Metatarsal somewhat short but rectus.  There is no subacute fracture noted otherwise bilaterally.  X-ray fibula on the lateral view. -Steroid injection performed bilaterally.  Injection is formed anterior around second MPJ, second interspace bilaterally.  I cleaned the skin with Betadine, alcohol and mixture of 1 cc  Kenalog 10, 0.5 cc of Marcaine plain, 0.5 cc of lidocaine plain was infiltrated bilaterally into the area of maximal tenderness without complications.  Postinjection care discussed.  Tolerated well. -I will send her orthotics back to make them falling for more cushion, offloading. -Defer to orthopedics for ankle pain status post fracture.  No follow-ups on file.  Vivi Barrack DPM

## 2022-11-22 NOTE — Patient Instructions (Signed)

## 2022-11-27 NOTE — Progress Notes (Unsigned)
Orthotics sent out for refurbish to [facility] patient will owe $90 if she has not paid already  [employer], CFo, CFm

## 2022-12-26 ENCOUNTER — Other Ambulatory Visit: Payer: Self-pay | Admitting: Family Medicine

## 2022-12-26 DIAGNOSIS — R1032 Left lower quadrant pain: Secondary | ICD-10-CM

## 2022-12-27 ENCOUNTER — Ambulatory Visit
Admission: RE | Admit: 2022-12-27 | Discharge: 2022-12-27 | Disposition: A | Discharge status: Home / Self Care | Payer: Medicare HMO | Source: Ambulatory Visit | Attending: Family Medicine | Admitting: Family Medicine

## 2022-12-27 DIAGNOSIS — R1032 Left lower quadrant pain: Secondary | ICD-10-CM | POA: Insufficient documentation

## 2022-12-27 MED ORDER — IOHEXOL 300 MG/ML  SOLN
100.0000 mL | Freq: Once | INTRAMUSCULAR | Status: AC | PRN
  Administered 2022-12-27: 100 mL via INTRAVENOUS

## 2023-01-09 ENCOUNTER — Other Ambulatory Visit: Payer: Self-pay | Admitting: Internal Medicine

## 2023-01-09 ENCOUNTER — Encounter: Payer: Self-pay | Admitting: Internal Medicine

## 2023-01-09 DIAGNOSIS — R000 Tachycardia, unspecified: Secondary | ICD-10-CM

## 2023-01-09 DIAGNOSIS — E782 Mixed hyperlipidemia: Secondary | ICD-10-CM

## 2023-01-14 ENCOUNTER — Ambulatory Visit
Admission: RE | Admit: 2023-01-14 | Discharge: 2023-01-14 | Disposition: A | Discharge status: Home / Self Care | Payer: Medicare HMO | Source: Ambulatory Visit | Attending: Internal Medicine | Admitting: Internal Medicine

## 2023-01-14 DIAGNOSIS — R000 Tachycardia, unspecified: Secondary | ICD-10-CM | POA: Insufficient documentation

## 2023-01-14 DIAGNOSIS — E782 Mixed hyperlipidemia: Secondary | ICD-10-CM | POA: Insufficient documentation

## 2023-01-21 ENCOUNTER — Other Ambulatory Visit: Payer: Medicaid Other

## 2023-02-06 ENCOUNTER — Other Ambulatory Visit: Payer: Self-pay

## 2023-02-06 ENCOUNTER — Ambulatory Visit: Payer: Medicaid Other | Admitting: Podiatry

## 2023-03-03 ENCOUNTER — Ambulatory Visit: Payer: Medicare HMO | Admitting: Physician Assistant

## 2023-03-06 ENCOUNTER — Ambulatory Visit: Payer: Medicaid Other | Admitting: Physician Assistant

## 2023-03-17 ENCOUNTER — Emergency Department: Payer: Medicare HMO

## 2023-03-17 ENCOUNTER — Other Ambulatory Visit: Payer: Self-pay

## 2023-03-17 ENCOUNTER — Emergency Department
Admission: EM | Admit: 2023-03-17 | Discharge: 2023-03-17 | Disposition: A | Discharge status: Home / Self Care | Payer: Medicare HMO | Attending: Emergency Medicine | Admitting: Emergency Medicine

## 2023-03-17 DIAGNOSIS — R0789 Other chest pain: Secondary | ICD-10-CM | POA: Diagnosis not present

## 2023-03-17 DIAGNOSIS — R002 Palpitations: Secondary | ICD-10-CM | POA: Insufficient documentation

## 2023-03-17 DIAGNOSIS — R0602 Shortness of breath: Secondary | ICD-10-CM | POA: Diagnosis not present

## 2023-03-17 DIAGNOSIS — Z5321 Procedure and treatment not carried out due to patient leaving prior to being seen by health care provider: Secondary | ICD-10-CM | POA: Insufficient documentation

## 2023-03-17 LAB — TROPONIN I (HIGH SENSITIVITY): Troponin I (High Sensitivity): 3 ng/L (ref <18)

## 2023-03-17 LAB — COMPREHENSIVE METABOLIC PANEL
ALT: 13 U/L (ref 0–44)
AST: 19 U/L (ref 15–41)
Albumin: 3.5 g/dL (ref 3.5–5.0)
Alkaline Phosphatase: 59 U/L (ref 38–126)
Anion gap: 10 (ref 5–15)
BUN: 8 mg/dL (ref 8–23)
CO2: 25 mmol/L (ref 22–32)
Calcium: 8.9 mg/dL (ref 8.9–10.3)
Chloride: 105 mmol/L (ref 98–111)
Creatinine, Ser: 0.89 mg/dL (ref 0.44–1.00)
GFR, Estimated: >60 mL/min (ref >60)
Glucose, Bld: 116 mg/dL — ABNORMAL HIGH (ref 70–99)
Potassium: 3.4 mmol/L — ABNORMAL LOW (ref 3.5–5.1)
Sodium: 140 mmol/L (ref 135–145)
Total Bilirubin: 0.6 mg/dL (ref 0.0–1.2)
Total Protein: 6.7 g/dL (ref 6.5–8.1)

## 2023-03-17 LAB — CBC WITH DIFFERENTIAL/PLATELET
Abs Immature Granulocytes: 0.01 10*3/uL (ref 0.00–0.07)
Basophils Absolute: 0 10*3/uL (ref 0.0–0.1)
Basophils Relative: 1 %
Eosinophils Absolute: 0.1 10*3/uL (ref 0.0–0.5)
Eosinophils Relative: 2 %
HCT: 33.1 % — ABNORMAL LOW (ref 36.0–46.0)
Hemoglobin: 10.6 g/dL — ABNORMAL LOW (ref 12.0–15.0)
Immature Granulocytes: 0 %
Lymphocytes Relative: 38 %
Lymphs Abs: 2.5 10*3/uL (ref 0.7–4.0)
MCH: 29.7 pg (ref 26.0–34.0)
MCHC: 32 g/dL (ref 30.0–36.0)
MCV: 92.7 fL (ref 80.0–100.0)
Monocytes Absolute: 0.5 10*3/uL (ref 0.1–1.0)
Monocytes Relative: 8 %
Neutro Abs: 3.3 10*3/uL (ref 1.7–7.7)
Neutrophils Relative %: 51 %
Platelets: 340 10*3/uL (ref 150–400)
RBC: 3.57 MIL/uL — ABNORMAL LOW (ref 3.87–5.11)
RDW: 13.2 % (ref 11.5–15.5)
WBC: 6.5 10*3/uL (ref 4.0–10.5)
nRBC: 0 % (ref 0.0–0.2)

## 2023-03-17 LAB — MAGNESIUM: Magnesium: 2.1 mg/dL (ref 1.7–2.4)

## 2023-03-17 NOTE — ED Triage Notes (Signed)
Pt c/o feeling palpitations for months but worsening over the past 2 days with associated chest tightness and SOB. Pt reports sx worsen with exertion. Pt wore a holter monitor in oct/nov for same sx but didn't find any abnormalities. Pt reports she has been exhausted from the holidays.

## 2023-06-18 ENCOUNTER — Other Ambulatory Visit: Payer: Self-pay | Admitting: Student

## 2023-06-18 DIAGNOSIS — M5136 Other intervertebral disc degeneration, lumbar region with discogenic back pain only: Secondary | ICD-10-CM

## 2023-06-22 ENCOUNTER — Other Ambulatory Visit

## 2023-06-22 ENCOUNTER — Ambulatory Visit
Admission: RE | Admit: 2023-06-22 | Discharge: 2023-06-22 | Disposition: A | Discharge status: Home / Self Care | Source: Ambulatory Visit | Attending: Student | Admitting: Student

## 2023-06-22 DIAGNOSIS — M5136 Other intervertebral disc degeneration, lumbar region with discogenic back pain only: Secondary | ICD-10-CM

## 2023-08-02 ENCOUNTER — Encounter: Payer: Self-pay | Admitting: Podiatry

## 2023-08-02 ENCOUNTER — Ambulatory Visit (INDEPENDENT_AMBULATORY_CARE_PROVIDER_SITE_OTHER)

## 2023-08-02 ENCOUNTER — Ambulatory Visit (INDEPENDENT_AMBULATORY_CARE_PROVIDER_SITE_OTHER): Admitting: Podiatry

## 2023-08-02 DIAGNOSIS — M7741 Metatarsalgia, right foot: Secondary | ICD-10-CM | POA: Diagnosis not present

## 2023-08-02 DIAGNOSIS — M779 Enthesopathy, unspecified: Secondary | ICD-10-CM

## 2023-08-02 DIAGNOSIS — M7751 Other enthesopathy of right foot: Secondary | ICD-10-CM | POA: Diagnosis not present

## 2023-08-02 NOTE — Progress Notes (Signed)
 Chief Complaint  Patient presents with   Foot Pain    "The ball of my right foot has been bothering me.  The left ankle is making a cracking noise.  I injured it last year." N - ball of foot pain L - 1-5 met area right D - 2 weeks O - suddenly, about the same C - throbbing A - flexing, walking T - Voltaren  Gel    HPI: 68 y.o. female presenting today for new complaint of pain and tenderness associated to the right forefoot.  Onset about 2 weeks ago.  No history of injury or no eliciting factor according the patient.  Idiopathic.  Over the last 7-10 days there has been improvement.  Past Medical History:  Diagnosis Date   Acid reflux    Anxiety    Arthritis    Asthma    Bronchitis 09/2015   CVA (cerebral vascular accident) (HCC)    Gross hematuria    H. pylori infection    Heart murmur    Kidney failure    Labile essential hypertension 01/06/2018   Lung abnormality    "damaged lung due to pneumonia"   Neck pain    Nephrolithiasis    Pneumonia    Pneumothorax    Scoliosis    Seasonal allergies    Urinary frequency    Vaginal atrophy     Past Surgical History:  Procedure Laterality Date   ABDOMINAL HYSTERECTOMY     vaginal   APPENDECTOMY     BREAST BIOPSY     COLONOSCOPY N/A 01/02/2020   Procedure: COLONOSCOPY;  Surgeon: Toledo, Alphonsus Jeans, MD;  Location: [facility] ENDOSCOPY;  Service: Gastroenterology;  Laterality: N/A;   COLONOSCOPY WITH PROPOFOL  N/A 12/29/2018   Procedure: COLONOSCOPY WITH PROPOFOL ;  Surgeon: Marnee Sink, MD;  Location: [facility] ENDOSCOPY;  Service: Endoscopy;  Laterality: N/A;   ESOPHAGOGASTRODUODENOSCOPY N/A 01/02/2020   Procedure: ESOPHAGOGASTRODUODENOSCOPY (EGD);  Surgeon: Toledo, Alphonsus Jeans, MD;  Location: [facility] ENDOSCOPY;  Service: Gastroenterology;  Laterality: N/A;   FOOT SURGERY Left    10/2015   NECK SURGERY     ORIF TIBIA PLATEAU Left 04/10/2022   Procedure: OPEN REDUCTION INTERNAL FIXATION (ORIF) TIBIAL PLATEAU VERSUS;  Surgeon: Laneta Pintos, MD;  Location: [facility] OR;  Service: Orthopedics;  Laterality: Left;    Allergies  Allergen Reactions   Grapefruit Extract Rash   Morphine  And Codeine Itching   Oxycodone  Itching   Oxymorphone Itching   Propoxyphene Nausea And Vomiting and Nausea Only     Physical Exam: General: The patient is alert and oriented x3 in no acute distress.  Dermatology: Skin is warm, dry and supple bilateral lower extremities.   Vascular: Palpable pedal pulses bilaterally. Capillary refill within normal limits.  No appreciable edema.  No erythema.  Neurological: Grossly intact via light touch  Musculoskeletal Exam: Hallux valgus deformity noted to the right foot.  There is tenderness with palpation range of motion of the 2nd, 4th, and 5th MTP of the right forefoot.  Diffuse pain throughout the forefoot as well  Radiographic Exam RT foot 08/02/2023:  Joint spaces preserved.  No fractures identified.  Hallux valgus deformity  Assessment/Plan of Care: 1.  Hallux valgus right 2.  MTP capsulitis/metatarsalgia right forefoot  -Patient evaluated.  X-rays reviewed -Declined cortisone injection today -No NSAIDs secondary to CKD -Continue pain medication with pain management for unrelated back pathology -Patient admits to wearing flats and nonsupportive house slippers around the house.  Advised against this.  Recommend good supportive  tennis shoes or sneakers or slides that offer support and cushion -Return to clinic PRN     Dot Gazella, DPM [facility]  Dr. Dot Gazella, DPM    [address] [place], [place] [address]                Office [phone]  Fax [phone]

## 2023-08-12 ENCOUNTER — Ambulatory Visit: Admitting: Student in an Organized Health Care Education/Training Program

## 2023-09-04 ENCOUNTER — Encounter: Payer: Self-pay | Admitting: Student in an Organized Health Care Education/Training Program

## 2023-09-04 ENCOUNTER — Ambulatory Visit
Attending: Student in an Organized Health Care Education/Training Program | Admitting: Student in an Organized Health Care Education/Training Program

## 2023-09-04 VITALS — BP 91/58 | HR 61 | Temp 97.4°F | Resp 16 | Ht 64.0 in | Wt 143.0 lb

## 2023-09-04 DIAGNOSIS — M5416 Radiculopathy, lumbar region: Secondary | ICD-10-CM | POA: Diagnosis present

## 2023-09-04 DIAGNOSIS — G8929 Other chronic pain: Secondary | ICD-10-CM

## 2023-09-04 DIAGNOSIS — M792 Neuralgia and neuritis, unspecified: Secondary | ICD-10-CM | POA: Diagnosis present

## 2023-09-04 NOTE — Progress Notes (Signed)
 Safety precautions to be maintained throughout the outpatient stay will include: orient to surroundings, keep bed in low position, maintain call bell within reach at all times, provide assistance with transfer out of bed and ambulation.

## 2023-09-04 NOTE — Patient Instructions (Signed)

## 2023-09-04 NOTE — Progress Notes (Signed)
 PROVIDER NOTE: Interpretation of information contained herein should be left to medically-trained personnel. Specific patient instructions are provided elsewhere under Patient Instructions section of medical record. This document was created in part using AI and STT-dictation technology, any transcriptional errors that may result from this process are unintentional.  Patient: Rachael Jordan  Service: E/M   PCP: Alexander Anes, MD  DOB: 28-Sep-1955  DOS: 09/04/2023  Provider: Cephus Collin, MD  MRN: [id number]  Delivery: Face-to-face  Specialty: Interventional Pain Management  Type: Established Patient  Setting: Ambulatory outpatient facility  Specialty designation: 09  Referring Prov.: Alexander Anes, MD  Location: Outpatient office facility       History of present illness (HPI) [name], a 68 y.o. year old female, is here today because of her Chronic radicular lumbar pain [M54.16, G89.29]. [name] primary complain today is Back Pain (R>L), Hip Pain, and Leg Pain (Right )  Pertinent problems: [name] has DDD (degenerative disc disease), lumbar; Lumbar radiculopathy; Spinal stenosis, lumbar region, with neurogenic claudication; Back pain, chronic; and Chronic pain syndrome on their pertinent problem list.  Pain Assessment: Severity of Chronic pain is reported as a 6 /10. Location: Back Lower, Right, Left (R>L)/Pain in lower back bialteral (R>L) radiates into right hip and into groin down entire right leg to right foot.. Onset: More than a month ago. Quality: Burning, Constant, Aching, Throbbing, Penetrating. Timing: Constant. Modifying factor(s): Elevating feet and weight off the legs. Ice and heat helps some. Medication and patches help. Vitals:  height is 5' 4 (1.626 m) and weight is 143 lb (64.9 kg). Her temporal temperature is 97.4 F (36.3 C) (abnormal). Her blood pressure is 91/58 (abnormal) and her pulse is 61. Her respiration is 16 and oxygen  saturation is 95%.  BMI:  Estimated body mass index is 24.55 kg/m as calculated from the following:   Height as of this encounter: 5' 4 (1.626 m).   Weight as of this encounter: 143 lb (64.9 kg).  Last encounter: Visit date not found. Last procedure: Visit date not found.  Reason for encounter:  Discussed the use of AI scribe software for clinical note transcription with the patient, who gave verbal consent to proceed.  History of Present Illness   Rachael Jordan is a 68 year old female with chronic sciatica who presents with increased pain and discomfort.  She has experienced increased sciatica pain over the past couple of months, with the pain radiating down her right leg and into her groin and perineum. The pain is described as aching, burning, tingling, and penetrating, primarily affecting her right side, causing her whole hip and leg to hurt. Recently, the pain has started spreading across to the lower left side.  She has a history of trying various treatments, including a SCS trial, but has not found relief. She experiences distress and anxiety, particularly with the sensation of 'teeth needles'.  She is not currently on any blood thinners such as Plavix or Eliquis, and she is not diabetic.  Her sister experiences similar symptoms and works in a Research officer, trade [employer].  No pain down her left leg, noting that the discomfort is primarily in her left hip.        No results found for: CBDTHCR No results found for: D8THCCBX No results found for: D9THCCBX  ROS  Constitutional: Denies any fever or chills Gastrointestinal: No reported hemesis, hematochezia, vomiting, or acute GI distress Musculoskeletal: Denies any acute onset joint swelling, redness, loss of ROM, or weakness Neurological: No  reported episodes of acute onset apraxia, aphasia, dysarthria, agnosia, amnesia, paralysis, loss of coordination, or loss of consciousness  Medication Review  B-12, Biotin, Cholecalciferol, Multiple  Vitamins-Minerals, Olopatadine  HCl, Vitamin E, acetaminophen , albuterol , amLODipine , amitriptyline , ammonium lactate , ascorbic acid, aspirin  EC, atorvastatin , azelastine , budesonide -formoterol , cetirizine, ciclopirox , diclofenac  Sodium, gabapentin , lidocaine , meloxicam, mirabegron  ER, montelukast , naloxone , nortriptyline , oxyCODONE , pantoprazole , polyethylene glycol, promethazine , pyridOXINE, rizatriptan , senna, tiZANidine , traZODone , and valACYclovir   History Review  Allergy: Rachael Jordan is allergic to grapefruit extract, morphine  and codeine, oxycodone , oxymorphone, and propoxyphene. Drug: Rachael Jordan  reports no history of drug use. Alcohol:  reports no history of alcohol use. Tobacco:  reports that she has never smoked. She has never used smokeless tobacco. Social: Rachael Jordan  reports that she has never smoked. She has never used smokeless tobacco. She reports that she does not drink alcohol and does not use drugs. Medical:  has a past medical history of Acid reflux, Anxiety, Arthritis, Asthma, Bronchitis (09/2015), CVA (cerebral vascular accident) (HCC), Gross hematuria, H. 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 Surgical: Rachael Jordan  has a past surgical history that includes Appendectomy; Abdominal hysterectomy; Neck surgery; Foot surgery (Left); Colonoscopy with propofol  (N/A, 12/29/2018); Esophagogastroduodenoscopy (N/A, 01/02/2020); Colonoscopy (N/A, 01/02/2020); Breast biopsy; and ORIF tibia plateau (Left, 04/10/2022). Family: family history includes Arthritis in her mother; Asthma in her mother; Cancer in her maternal aunt; Diabetes in her sister; Hyperlipidemia in her mother; Hypertension in her mother.  Laboratory Chemistry Profile   Renal Lab Results  Component Value Date   BUN 8 03/17/2023    CREATININE 0.89 03/17/2023   LABCREA 157 12/28/2018   BCR 11 (L) 09/29/2017   GFRAA 58 (L) 06/18/2019   GFRNONAA >60 03/17/2023    Hepatic Lab Results  Component Value Date   AST 19 03/17/2023   ALT 13 03/17/2023   ALBUMIN 3.5 03/17/2023   ALKPHOS 59 03/17/2023   LIPASE 25 12/30/2019    Electrolytes Lab Results  Component Value Date   NA 140 03/17/2023   K 3.4 (L) 03/17/2023   CL 105 03/17/2023   CALCIUM  8.9 03/17/2023   MG 2.1 03/17/2023   PHOS 4.5 12/31/2019    Bone Lab Results  Component Value Date   VD25OH See Scanned report in [facility] 04/10/2022    Inflammation (CRP: Acute Phase) (ESR: Chronic Phase) Lab Results  Component Value Date   LATICACIDVEN 1.1 12/30/2019         Note: Above Lab results reviewed.  Recent Imaging Review  MR LUMBAR SPINE WITHOUT IV CONTRAST   COMPARISON: 07/22/2019   CLINICAL HISTORY: Discogenic syndrome   TECHNIQUE: SAG T2, SAG T1, SAG STIR, AX T2, AX T1 without IV contrast.   FINDINGS: There is stable levoscoliosis of the lumbar spine. There is a chronic superior endplate compression fracture of the T12 vertebral body with less than 20% height loss. There is a Schmorl's node of the superior endplate of the T12 vertebral body. No edema is identified. There is no lumbar vertebral body height loss, subluxation or marrow replacing process. The sacrum and SI joints are unremarkable so far as visualized. Conus and cauda equina are unremarkable.   T12-L1: There is no focal disc protrusion, foraminal or spinal stenosis.   L1-2: There is no focal disc protrusion, foraminal or spinal stenosis.   L2-3: There is no focal disc protrusion, foraminal or spinal stenosis. Mild facet arthrosis.  L3-4: There is no focal disc protrusion, foraminal or spinal stenosis.   L4-5: There is no focal protrusion or foraminal or spinal stenosis.   L5-S1: Stable disc desiccation with disc space loss and endplate changes. There is mild  discogenic edema (Modic 1 change). There is a mild broad-based disc osteophyte slightly narrowing the foramina bilaterally, left slightly greater than right.  Mild facet arthrosis is present.   The retroperitoneal structures demonstrate no significant abnormality.   IMPRESSION: Relatively stable degenerative disc desiccation L5-S1 with slight retrolisthesis mild bilateral foraminal narrowing, left slightly greater than right.  Stable chronic superior endplate compression fracture of the T12 vertebral body.   Electronically signed by: Adrien Alberta MD 07/15/2023 03:01 PM EDT RP Workstation: [id number]   Note: Reviewed        Physical Exam  General appearance: Well nourished, well developed, and well hydrated. In no apparent acute distress Mental status: Alert, oriented x 3 (person, place, & time)       Respiratory: No evidence of acute respiratory distress Eyes: PERLA Vitals: BP (!) 91/58 (BP Location: Right Arm, Patient Position: Sitting)   Pulse 61   Temp (!) 97.4 F (36.3 C) (Temporal)   Resp 16   Ht 5' 4 (1.626 m)   Wt 143 lb (64.9 kg)   SpO2 95%   BMI 24.55 kg/m  BMI: Estimated body mass index is 24.55 kg/m as calculated from the following:   Height as of this encounter: 5' 4 (1.626 m).   Weight as of this encounter: 143 lb (64.9 kg). Ideal: Ideal body weight: 54.7 kg (120 lb 9.5 oz) Adjusted ideal body weight: 58.8 kg (129 lb 8.9 oz)  Lumbar Spine Area Exam  Skin & Axial Inspection: No masses, redness, or swelling Alignment: Symmetrical Functional ROM: Pain restricted ROM affecting primarily the right Stability: No instability detected Muscle Tone/Strength: Functionally intact. No obvious neuro-muscular anomalies detected. Sensory (Neurological): Dermatomal pain pattern right leg Palpation: No palpable anomalies       Provocative Tests: Hyperextension/rotation test: (+) due to pain. Lumbar quadrant test (Kemp's test): (+) on the right for foraminal  stenosis  Gait & Posture Assessment  Ambulation: Unassisted Gait: Relatively normal for age and body habitus Posture: WNL  Lower Extremity Exam    Side: Right lower extremity  Side: Left lower extremity  Stability: No instability observed          Stability: No instability observed          Skin & Extremity Inspection: Skin color, temperature, and hair growth are WNL. No peripheral edema or cyanosis. No masses, redness, swelling, asymmetry, or associated skin lesions. No contractures.  Skin & Extremity Inspection: Skin color, temperature, and hair growth are WNL. No peripheral edema or cyanosis. No masses, redness, swelling, asymmetry, or associated skin lesions. No contractures.  Functional ROM: Pain restricted ROM for hip and knee joints          Functional ROM: Unrestricted ROM                  Muscle Tone/Strength: Functionally intact. No obvious neuro-muscular anomalies detected.  Muscle Tone/Strength: Functionally intact. No obvious neuro-muscular anomalies detected.  Sensory (Neurological): Neurogenic pain pattern        Sensory (Neurological): Unimpaired        DTR: Patellar: deferred today Achilles: deferred today Plantar: deferred today  DTR: Patellar: deferred today Achilles: deferred today Plantar: deferred today  Palpation: No palpable anomalies  Palpation: No palpable anomalies    Assessment  Diagnosis Status  1. Chronic radicular lumbar pain   2. Lumbar radiculopathy   3. Neuropathic pain of right lower extremity    Having a Flare-up Having a Flare-up Having a Flare-up   Updated Problems: Problem  Lumbar Radiculopathy    Plan of Care  Assessment and Plan    Lumbar Radicular Pain, right  She experiences chronic sciatica with increased pain over the past couple of months. The pain radiates down the right leg and into the groin and perineum causing discomfort in the right hip and leg, with spreading to the left hip. Symptoms include aching, burning, tingling,  and a sensation of needles. Previous treatments have been ineffective. An epidural injection is recommended based upon symptoms and MRI findings.  Recommend right L5 and right S1 transforaminal ESI  Anxiety related to medical procedure   She has significant anxiety related to the procedure, particularly concerning the sensation of needles. Moderate sedation in the OR is planned to ensure comfort and manage anxiety. Provide moderate sedation during the procedure to alleviate anxiety. Ensure she brings a driver for the procedure and instruct her to stop eating the night before the procedure.       Orders:  Orders Placed This Encounter  Procedures   Lumbar Transforaminal Epidural    Standing Status:   Future    Expected Date:   09/29/2023    Expiration Date:   09/03/2024    Scheduling Instructions:     Laterality: RIGHT L5 and S1        Sedation: ECT     Timeframe: As soon as schedule allows.    Where will this procedure be performed?:   [facility]    Return in about 25 days (around 09/29/2023) for Right L5 and S1 TF ESI, ECT.    Recent Visits No visits were found meeting these conditions. Showing recent visits within past 90 days and meeting all other requirements Today's Visits Date Type Provider Dept  09/04/23 Office Visit Cephus Collin, MD [facility]  Showing today's visits and meeting all other requirements Future Appointments No visits were found meeting these conditions. Showing future appointments within next 90 days and meeting all other requirements  I discussed the assessment and treatment plan with the patient. The patient was provided an opportunity to ask questions and all were answered. The patient agreed with the plan and demonstrated an understanding of the instructions.  Patient advised to call back or seek an in-person evaluation if the symptoms or condition worsens.  Duration of encounter: .  Total time on encounter, as per AMA  guidelines included both the face-to-face and non-face-to-face time personally spent by the physician and/or other qualified health care professional(s) on the day of the encounter (includes time in activities that require the physician or other qualified health care professional and does not include time in activities normally performed by clinical staff). Physician's time may include the following activities when performed: Preparing to see the patient (e.g., pre-charting review of records, searching for previously ordered imaging, lab work, and nerve conduction tests) Review of prior analgesic pharmacotherapies. Reviewing PMP Interpreting ordered tests (e.g., lab work, imaging, nerve conduction tests) Performing post-procedure evaluations, including interpretation of diagnostic procedures Obtaining and/or reviewing separately obtained history Performing a medically appropriate examination and/or evaluation Counseling and educating the patient/family/caregiver Ordering medications, tests, or procedures Referring and communicating with other health care professionals (when not separately reported) Documenting clinical information in the electronic or other health record Independently interpreting results (not  separately reported) and communicating results to the patient/ family/caregiver Care coordination (not separately reported)  Note by: Cephus Collin, MD (TTS and AI technology used. I apologize for any typographical errors that were not detected and corrected.) Date: 09/04/2023; Time: 2:15 PM

## 2023-09-10 ENCOUNTER — Other Ambulatory Visit: Payer: Self-pay

## 2023-09-10 DIAGNOSIS — R0602 Shortness of breath: Secondary | ICD-10-CM

## 2023-09-12 ENCOUNTER — Observation Stay

## 2023-09-12 ENCOUNTER — Emergency Department

## 2023-09-12 ENCOUNTER — Observation Stay
Admission: EM | Admit: 2023-09-12 | Discharge: 2023-09-14 | Disposition: A | Discharge status: Home / Self Care | Attending: Internal Medicine | Admitting: Internal Medicine

## 2023-09-12 ENCOUNTER — Other Ambulatory Visit: Payer: Self-pay

## 2023-09-12 DIAGNOSIS — Z79899 Other long term (current) drug therapy: Secondary | ICD-10-CM | POA: Insufficient documentation

## 2023-09-12 DIAGNOSIS — E785 Hyperlipidemia, unspecified: Secondary | ICD-10-CM | POA: Diagnosis not present

## 2023-09-12 DIAGNOSIS — J45909 Unspecified asthma, uncomplicated: Secondary | ICD-10-CM

## 2023-09-12 DIAGNOSIS — K219 Gastro-esophageal reflux disease without esophagitis: Secondary | ICD-10-CM | POA: Diagnosis not present

## 2023-09-12 DIAGNOSIS — R55 Syncope and collapse: Secondary | ICD-10-CM | POA: Diagnosis present

## 2023-09-12 DIAGNOSIS — J45998 Other asthma: Secondary | ICD-10-CM | POA: Insufficient documentation

## 2023-09-12 DIAGNOSIS — N179 Acute kidney failure, unspecified: Secondary | ICD-10-CM | POA: Diagnosis not present

## 2023-09-12 DIAGNOSIS — E876 Hypokalemia: Secondary | ICD-10-CM

## 2023-09-12 DIAGNOSIS — Z7982 Long term (current) use of aspirin: Secondary | ICD-10-CM | POA: Insufficient documentation

## 2023-09-12 DIAGNOSIS — I10 Essential (primary) hypertension: Secondary | ICD-10-CM | POA: Diagnosis present

## 2023-09-12 DIAGNOSIS — G629 Polyneuropathy, unspecified: Secondary | ICD-10-CM | POA: Insufficient documentation

## 2023-09-12 DIAGNOSIS — G459 Transient cerebral ischemic attack, unspecified: Principal | ICD-10-CM | POA: Diagnosis present

## 2023-09-12 LAB — URINALYSIS, ROUTINE W REFLEX MICROSCOPIC
Bilirubin Urine: NEGATIVE
Glucose, UA: NEGATIVE mg/dL
Hgb urine dipstick: NEGATIVE
Ketones, ur: NEGATIVE mg/dL
Nitrite: NEGATIVE
Protein, ur: NEGATIVE mg/dL
Specific Gravity, Urine: 1.008 (ref 1.005–1.030)
pH: 6 (ref 5.0–8.0)

## 2023-09-12 LAB — COMPREHENSIVE METABOLIC PANEL WITH GFR
ALT: 14 U/L (ref 0–44)
AST: 22 U/L (ref 15–41)
Albumin: 4.2 g/dL (ref 3.5–5.0)
Alkaline Phosphatase: 63 U/L (ref 38–126)
Anion gap: 11 (ref 5–15)
BUN: 25 mg/dL — ABNORMAL HIGH (ref 8–23)
CO2: 25 mmol/L (ref 22–32)
Calcium: 9.3 mg/dL (ref 8.9–10.3)
Chloride: 104 mmol/L (ref 98–111)
Creatinine, Ser: 2.53 mg/dL — ABNORMAL HIGH (ref 0.44–1.00)
GFR, Estimated: 20 mL/min — ABNORMAL LOW (ref >60)
Glucose, Bld: 71 mg/dL (ref 70–99)
Potassium: 2.7 mmol/L — CL (ref 3.5–5.1)
Sodium: 140 mmol/L (ref 135–145)
Total Bilirubin: 0.8 mg/dL (ref 0.0–1.2)
Total Protein: 7.4 g/dL (ref 6.5–8.1)

## 2023-09-12 LAB — CBC
HCT: 38.7 % (ref 36.0–46.0)
Hemoglobin: 12.3 g/dL (ref 12.0–15.0)
MCH: 29.2 pg (ref 26.0–34.0)
MCHC: 31.8 g/dL (ref 30.0–36.0)
MCV: 91.9 fL (ref 80.0–100.0)
Platelets: 415 10*3/uL — ABNORMAL HIGH (ref 150–400)
RBC: 4.21 MIL/uL (ref 3.87–5.11)
RDW: 13.5 % (ref 11.5–15.5)
WBC: 13.2 10*3/uL — ABNORMAL HIGH (ref 4.0–10.5)
nRBC: 0 % (ref 0.0–0.2)

## 2023-09-12 LAB — MAGNESIUM: Magnesium: 2 mg/dL (ref 1.7–2.4)

## 2023-09-12 LAB — TROPONIN I (HIGH SENSITIVITY): Troponin I (High Sensitivity): 7 ng/L (ref <18)

## 2023-09-12 MED ORDER — LIDOCAINE 5 % EX PTCH
1.0000 | MEDICATED_PATCH | Freq: Every day | CUTANEOUS | Status: DC
  Filled 2023-09-12: qty 1

## 2023-09-12 MED ORDER — BIOTIN 10000 MCG PO TABS: 10.0000 mg | ORAL_TABLET | Freq: Every day | ORAL | Status: DC

## 2023-09-12 MED ORDER — ACETAMINOPHEN 650 MG RE SUPP: 650.0000 mg | RECTAL | Status: DC | PRN

## 2023-09-12 MED ORDER — AMLODIPINE BESYLATE 5 MG PO TABS
5.0000 mg | ORAL_TABLET | Freq: Every day | ORAL | Status: DC
  Administered 2023-09-13 – 2023-09-14 (×2): 5 mg via ORAL
  Filled 2023-09-12 (×2): qty 1

## 2023-09-12 MED ORDER — ONDANSETRON HCL 4 MG/2ML IJ SOLN
4.0000 mg | INTRAMUSCULAR | Status: DC | PRN
  Administered 2023-09-13: 4 mg via INTRAVENOUS
  Filled 2023-09-12: qty 2

## 2023-09-12 MED ORDER — LACTATED RINGERS IV BOLUS
1000.0000 mL | Freq: Once | INTRAVENOUS | Status: AC
  Administered 2023-09-12: 1000 mL via INTRAVENOUS

## 2023-09-12 MED ORDER — ADULT MULTIVITAMIN W/MINERALS CH
1.0000 | ORAL_TABLET | Freq: Every day | ORAL | Status: DC
  Administered 2023-09-13 – 2023-09-14 (×2): 1 via ORAL
  Filled 2023-09-12 (×2): qty 1

## 2023-09-12 MED ORDER — SODIUM CHLORIDE 0.9 % IV SOLN: INTRAVENOUS | Status: DC

## 2023-09-12 MED ORDER — TRAZODONE HCL 50 MG PO TABS: 200.0000 mg | ORAL_TABLET | Freq: Every evening | ORAL | Status: DC | PRN

## 2023-09-12 MED ORDER — VITAMIN B-12 1000 MCG PO TABS
1000.0000 ug | ORAL_TABLET | Freq: Every day | ORAL | Status: DC
  Administered 2023-09-13 – 2023-09-14 (×2): 1000 ug via ORAL
  Filled 2023-09-12 (×2): qty 1

## 2023-09-12 MED ORDER — VITAMIN D3 25 MCG (1000 UNIT) PO TABS
1000.0000 [IU] | ORAL_TABLET | Freq: Every day | ORAL | Status: DC
  Administered 2023-09-13 – 2023-09-14 (×2): 1000 [IU] via ORAL
  Filled 2023-09-12 (×4): qty 1

## 2023-09-12 MED ORDER — SUMATRIPTAN SUCCINATE 50 MG PO TABS
50.0000 mg | ORAL_TABLET | ORAL | Status: DC | PRN
  Administered 2023-09-13: 50 mg via ORAL
  Filled 2023-09-12 (×2): qty 1

## 2023-09-12 MED ORDER — SENNOSIDES-DOCUSATE SODIUM 8.6-50 MG PO TABS: 1.0000 | ORAL_TABLET | Freq: Every evening | ORAL | Status: DC | PRN

## 2023-09-12 MED ORDER — POTASSIUM CHLORIDE CRYS ER 20 MEQ PO TBCR
40.0000 meq | EXTENDED_RELEASE_TABLET | Freq: Once | ORAL | Status: AC
  Administered 2023-09-12: 40 meq via ORAL
  Filled 2023-09-12: qty 2

## 2023-09-12 MED ORDER — STROKE: EARLY STAGES OF RECOVERY BOOK: Freq: Once | Status: AC

## 2023-09-12 MED ORDER — VITAMIN E 45 MG (100 UNIT) PO CAPS
90.0000 mg | ORAL_CAPSULE | Freq: Every day | ORAL | Status: DC
  Administered 2023-09-13 – 2023-09-14 (×2): 200 [IU] via ORAL
  Filled 2023-09-12 (×2): qty 2

## 2023-09-12 MED ORDER — ACETAMINOPHEN 160 MG/5ML PO SOLN: 650.0000 mg | ORAL | Status: DC | PRN

## 2023-09-12 MED ORDER — GABAPENTIN 400 MG PO CAPS: 800.0000 mg | ORAL_CAPSULE | Freq: Three times a day (TID) | ORAL | Status: DC | PRN

## 2023-09-12 MED ORDER — OLOPATADINE HCL 0.1 % OP SOLN: 1.0000 [drp] | Freq: Two times a day (BID) | OPHTHALMIC | Status: DC

## 2023-09-12 MED ORDER — ATORVASTATIN CALCIUM 20 MG PO TABS
20.0000 mg | ORAL_TABLET | Freq: Every day | ORAL | Status: DC
  Administered 2023-09-13 – 2023-09-14 (×2): 20 mg via ORAL
  Filled 2023-09-12 (×2): qty 1

## 2023-09-12 MED ORDER — CETIRIZINE HCL 10 MG PO TABS
10.0000 mg | ORAL_TABLET | Freq: Every day | ORAL | Status: DC
  Administered 2023-09-13 – 2023-09-14 (×2): 10 mg via ORAL
  Filled 2023-09-12 (×2): qty 1

## 2023-09-12 MED ORDER — VITAMIN C 500 MG PO TABS
500.0000 mg | ORAL_TABLET | Freq: Every day | ORAL | Status: DC
  Administered 2023-09-13 – 2023-09-14 (×2): 500 mg via ORAL
  Filled 2023-09-12 (×2): qty 1

## 2023-09-12 MED ORDER — ASPIRIN 81 MG PO TBEC
81.0000 mg | DELAYED_RELEASE_TABLET | Freq: Every day | ORAL | Status: DC
  Administered 2023-09-13 – 2023-09-14 (×2): 81 mg via ORAL
  Filled 2023-09-12 (×2): qty 1

## 2023-09-12 MED ORDER — FENTANYL 50 MCG/HR TD PT72: 1.0000 | MEDICATED_PATCH | TRANSDERMAL | Status: DC

## 2023-09-12 MED ORDER — ENOXAPARIN SODIUM 30 MG/0.3ML IJ SOSY
30.0000 mg | PREFILLED_SYRINGE | INTRAMUSCULAR | Status: DC
  Administered 2023-09-12: 30 mg via SUBCUTANEOUS
  Filled 2023-09-12: qty 0.3

## 2023-09-12 MED ORDER — VITAMIN B-6 100 MG PO TABS
100.0000 mg | ORAL_TABLET | Freq: Every day | ORAL | Status: DC
  Administered 2023-09-13 – 2023-09-14 (×2): 100 mg via ORAL
  Filled 2023-09-12 (×2): qty 1

## 2023-09-12 MED ORDER — NALOXONE HCL 4 MG/0.1ML NA LIQD: 1.0000 | Freq: Once | NASAL | Status: DC | PRN

## 2023-09-12 MED ORDER — ALBUTEROL SULFATE (2.5 MG/3ML) 0.083% IN NEBU: 2.5000 mg | INHALATION_SOLUTION | Freq: Four times a day (QID) | RESPIRATORY_TRACT | Status: DC | PRN

## 2023-09-12 MED ORDER — MONTELUKAST SODIUM 10 MG PO TABS
10.0000 mg | ORAL_TABLET | Freq: Every day | ORAL | Status: DC
  Administered 2023-09-12 – 2023-09-13 (×2): 10 mg via ORAL
  Filled 2023-09-12 (×2): qty 1

## 2023-09-12 MED ORDER — ACETAMINOPHEN 325 MG PO TABS: 650.0000 mg | ORAL_TABLET | ORAL | Status: DC | PRN

## 2023-09-12 MED ORDER — TRAZODONE HCL 50 MG PO TABS: 25.0000 mg | ORAL_TABLET | Freq: Every evening | ORAL | Status: DC | PRN

## 2023-09-12 MED ORDER — POTASSIUM CHLORIDE IN NACL 20-0.9 MEQ/L-% IV SOLN
INTRAVENOUS | Status: DC
  Filled 2023-09-12 (×3): qty 1000

## 2023-09-12 MED ORDER — POLYETHYLENE GLYCOL 3350 17 G PO PACK
17.0000 g | PACK | Freq: Every day | ORAL | Status: DC | PRN
  Administered 2023-09-14: 17 g via ORAL
  Filled 2023-09-12: qty 1

## 2023-09-12 NOTE — Assessment & Plan Note (Signed)
-   This is suspicious based on his left-sided facial numbness as well as left arm numbness and vertigo. - The patient will be admitted to an observation medically monitored bed.   - We will follow neuro checks q.4 hours for 24 hours.   - The patient will be placed on aspirin .   - Will obtain a brain MRI without contrast as well as  2D echo with bubble study .   - A neurology consultation  as well as physical/occupation/speech therapy consults will be obtained in a.m.SABRA - Notify Dr. Lindzen about the patient. - The patient will be placed on statin therapy and fasting lipids will be checked.

## 2023-09-12 NOTE — Assessment & Plan Note (Signed)
-   This is likely prerenal due to volume depletion and dehydration. - We will place her on hydration with IV normal saline with added potassium chloride . - Will avoid nephrotoxins. - We will follow BMP

## 2023-09-12 NOTE — ED Notes (Signed)
 Pt reports multiple sticks for blood in triage. This RN attempted IV stick x2.

## 2023-09-12 NOTE — Assessment & Plan Note (Signed)
 Continue Neurontin

## 2023-09-12 NOTE — ED Triage Notes (Signed)
 C/O syncope last night. And two night history of intermittent left facial numbness, which has resolved. Presents today MAE equally and strong. Gait steady. Speech clear. NAD

## 2023-09-12 NOTE — Assessment & Plan Note (Signed)
-   Potassium will be replaced and magnesium level will be checked. ?

## 2023-09-12 NOTE — ED Notes (Signed)
 EKG printed and given to Jessup, MD

## 2023-09-12 NOTE — ED Provider Notes (Signed)
 [facility] Provider Note    Event Date/Time   First MD Initiated Contact with Patient 09/12/23 1814     (approximate)   History   Chief Complaint Loss of Consciousness   HPI  Rachael Jordan is a 68 y.o. female with past medical history of hypertension, hyperlipidemia, COPD, stroke, CKD, and chronic pain syndrome who presents to the ED complaining of syncope.  Patient reports that she woke up in the middle of the night 2 nights ago with left-sided numbness which lasted for least an hour before she was able to go back to sleep.  She then had a similar episode the night before last with left-sided facial numbness as well as numbness that affected her left hand.  She did not notice any facial droop or weakness in her extremities and symptoms again resolved on their own.  Yesterday, she then had an episode where she began to feel dizzy with the room spinning around her, had difficulty speaking at that time.  She states that she ended up losing consciousness and fell onto her backside, denies hitting her head.  She denies any associated chest pain or shortness of breath.  She spoke to her PCP about symptoms earlier today, who recommended she come to the ED for further evaluation.     Physical Exam   Triage Vital Signs: ED Triage Vitals  Encounter Vitals Group     BP 09/12/23 1527 98/60     Girls Systolic BP Percentile --      Girls Diastolic BP Percentile --      Boys Systolic BP Percentile --      Boys Diastolic BP Percentile --      Pulse Rate 09/12/23 1527 68     Resp 09/12/23 1527 18     Temp 09/12/23 1527 98.1 F (36.7 C)     Temp Source 09/12/23 1527 Oral     SpO2 09/12/23 1527 99 %     Weight 09/12/23 1531 142 lb 13.7 oz (64.8 kg)     Height --      Head Circumference --      Peak Flow --      Pain Score 09/12/23 1530 7     Pain Loc --      Pain Education --      Exclude from Growth Chart --     Most recent vital signs: Vitals:   09/12/23  1959 09/12/23 2005  BP: 129/76   Pulse: 73   Resp: 19   Temp:  98 F (36.7 C)  SpO2: 100%     Constitutional: Alert and oriented. Eyes: Conjunctivae are normal. Head: Atraumatic. Nose: No congestion/rhinnorhea. Mouth/Throat: Mucous membranes are moist.  Neck: No midline cervical spine tenderness to palpation. Cardiovascular: Normal rate, regular rhythm. Grossly normal heart sounds.  2+ radial pulses bilaterally. Respiratory: Normal respiratory effort.  No retractions. Lungs CTAB. Gastrointestinal: Soft and nontender. No distention. Musculoskeletal: No lower extremity tenderness nor edema.  Neurologic:  Normal speech and language. No gross focal neurologic deficits are appreciated.    ED Results / Procedures / Treatments   Labs (all labs ordered are listed, but only abnormal results are displayed) Labs Reviewed  COMPREHENSIVE METABOLIC PANEL WITH GFR - Abnormal; Notable for the following components:      Result Value   Potassium 2.7 (*)    BUN 25 (*)    Creatinine, Ser 2.53 (*)    GFR, Estimated 20 (*)    All other components within  normal limits  CBC - Abnormal; Notable for the following components:   WBC 13.2 (*)    Platelets 415 (*)    All other components within normal limits  MAGNESIUM   URINALYSIS, ROUTINE W REFLEX MICROSCOPIC  CBG MONITORING, ED  TROPONIN I (HIGH SENSITIVITY)     EKG  ED ECG REPORT I, Carlin Palin, the attending physician, personally viewed and interpreted this ECG.   Date: 09/12/2023  EKG Time: 18:33  Rate: 66  Rhythm: normal sinus rhythm  Axis: Normal  Intervals:none  ST&T Change: None  RADIOLOGY CT head reviewed and interpreted by me with no hemorrhage or midline shift.  PROCEDURES:  Critical Care performed: No  Procedures   MEDICATIONS ORDERED IN ED: Medications  lactated ringers  bolus 1,000 mL (1,000 mLs Intravenous New Bag/Given 09/12/23 1931)  potassium chloride  SA (KLOR-CON  M) CR tablet 40 mEq (40 mEq Oral Given  09/12/23 1930)     IMPRESSION / MDM / ASSESSMENT AND PLAN / ED COURSE  I reviewed the triage vital signs and the nursing notes.                              68 y.o. female with past medical history of hypertension, hyperlipidemia, stroke, COPD, CKD, and chronic pain syndrome who presents to the ED complaining syncopal episode yesterday after 2 separate occasions of left-sided facial numbness.  Patient's presentation is most consistent with acute presentation with potential threat to life or bodily function.  Differential diagnosis includes, but is not limited to, stroke, TIA, arrhythmia, ACS, anemia, electrolyte abnormality, AKI, orthostatic hypotension, vasovagal episode.  Patient nontoxic-appearing and in no acute distress, vital signs are unremarkable.  EKG shows no evidence of arrhythmia or ischemia and troponin within normal limits, overall low suspicion for cardiac etiology for syncopal episode.  No evidence of traumatic injury associated with syncopal episode, but will check CT head given intermittent facial numbness with difficulty speaking over the past couple of days.  Labs show mild leukocytosis but no significant anemia, patient does have significant AKI with hypokalemia, will replete potassium.  Magnesium  level within normal limits, LFTs are unremarkable.  CT head is negative for acute process.  Case discussed with hospitalist for admission for syncope, AKI, and suspected TIA.      FINAL CLINICAL IMPRESSION(S) / ED DIAGNOSES   Final diagnoses:  Syncope, unspecified syncope type  AKI (acute kidney injury) (HCC)  TIA (transient ischemic attack)     Rx / DC Orders   ED Discharge Orders     None        Note:  This document was prepared using Dragon voice recognition software and may include unintentional dictation errors.   Palin Carlin, MD 09/12/23 2033

## 2023-09-12 NOTE — ED Notes (Signed)
 Patient transported to CT

## 2023-09-12 NOTE — Assessment & Plan Note (Addendum)
-   Will continue her bronchodilator therapy while holding of Symbicort . - Will resume Singulair .

## 2023-09-12 NOTE — Assessment & Plan Note (Signed)
 Will continue PPI therapy.

## 2023-09-12 NOTE — Assessment & Plan Note (Signed)
-   Will continue statin therapy. - Fasting lipids will be checked.

## 2023-09-12 NOTE — Assessment & Plan Note (Signed)
-   We will continue antihypertensive therapy. - Permissive hypertension will be allowed.

## 2023-09-12 NOTE — H&P (Addendum)
 [place]   PATIENT NAME: Rachael Jordan    MR#:  [id number]  DATE OF BIRTH:  1955/12/12  DATE OF ADMISSION:  09/12/2023  PRIMARY CARE PHYSICIAN: George, Sionne A, MD   Patient is coming from: Home  REQUESTING/REFERRING PHYSICIAN: Willo Dunnings, MD  CHIEF COMPLAINT:   Chief Complaint  Patient presents with   Loss of Consciousness    HISTORY OF PRESENT ILLNESS:  Rachael Jordan is a 68 y.o. African-American female with medical history significant for osteoarthritis, GERD, anxiety, asthma, CVA and seasonal allergy, presenting to the emergency room with acute onset of left-sided facial numbness that started on Wednesday and left facial numbness as well as left hand numbness that occurred last night with associated cramping in the left hand.  Today she felt cramps in her right hand as well as both feet.  She had a syncopal episode as well last night when she was in the bathroom which was preceded by vertigo.  No headache or dizziness or blurred vision.  She has been having dry cough without wheezing or dyspnea.  No chest pain or palpitations.  No nausea or vomiting or abdominal pain.  No bleeding diathesis.  ED Course: When the patient came to the ER, respiratory rate was 23 and otherwise vital signs were within normal.  Labs revealed hypokalemia 2.7 and AKI with BUN of 25 and creatinine 2.53 previously normal.  CBC revealed leukocytosis of 13.2 and thrombocytosis of 115.  UA was negative.  EKG as reviewed by me : EKG showed normal sinus rhythm with a rate of 66 with left atrial enlargement and LVH. Imaging: Noncontrast head CT scan revealed no acute intracranial normalities.  The patient was given 40 mill equivalent p.o. potassium chloride  and 1 L bolus of IV lactated ringer .  She will be admitted to a medical telemetry observation bed for further evaluation and management. PAST MEDICAL HISTORY:   Past Medical History:  Diagnosis Date   Acid reflux    Anxiety     Arthritis    Asthma    Bronchitis 09/2015   CVA (cerebral vascular accident) (HCC)    Gross hematuria    H. pylori infection    Heart murmur    Kidney failure    Labile essential hypertension 01/06/2018   Lung abnormality    damaged lung due to pneumonia   Neck pain    Nephrolithiasis    Pneumonia    Pneumothorax    Scoliosis    Seasonal allergies    Urinary frequency    Vaginal atrophy     PAST SURGICAL HISTORY:   Past Surgical History:  Procedure Laterality Date   ABDOMINAL HYSTERECTOMY     vaginal   APPENDECTOMY     BREAST BIOPSY     COLONOSCOPY N/A 01/02/2020   Procedure: COLONOSCOPY;  Surgeon: Toledo, Ladell POUR, MD;  Location: [facility] ENDOSCOPY;  Service: Gastroenterology;  Laterality: N/A;   COLONOSCOPY WITH PROPOFOL  N/A 12/29/2018   Procedure: COLONOSCOPY WITH PROPOFOL ;  Surgeon: Jinny Carmine, MD;  Location: [facility] ENDOSCOPY;  Service: Endoscopy;  Laterality: N/A;   ESOPHAGOGASTRODUODENOSCOPY N/A 01/02/2020   Procedure: ESOPHAGOGASTRODUODENOSCOPY (EGD);  Surgeon: Toledo, Ladell POUR, MD;  Location: [facility] ENDOSCOPY;  Service: Gastroenterology;  Laterality: N/A;   FOOT SURGERY Left    10/2015   NECK SURGERY     ORIF TIBIA PLATEAU Left 04/10/2022   Procedure: OPEN REDUCTION INTERNAL FIXATION (ORIF) TIBIAL PLATEAU VERSUS;  Surgeon: Kendal Franky SQUIBB, MD;  Location: [facility] OR;  Service: Orthopedics;  Laterality: Left;    SOCIAL HISTORY:   Social History   Tobacco Use   Smoking status: Never   Smokeless tobacco: Never  Substance Use Topics   Alcohol use: No    Alcohol/week: 0.0 standard drinks of alcohol    FAMILY HISTORY:   Family History  Problem Relation Age of Onset   Arthritis Mother    Asthma Mother    Hyperlipidemia Mother    Hypertension Mother    Diabetes Sister    Cancer Maternal Aunt        esophagus   Breast cancer Neg Hx    Kidney cancer Neg Hx    Bladder Cancer Neg Hx     DRUG ALLERGIES:   Allergies  Allergen Reactions   Grapefruit Extract  Rash   Morphine  And Codeine Itching   Oxycodone  Itching   Oxymorphone Itching   Propoxyphene Nausea And Vomiting and Nausea Only    REVIEW OF SYSTEMS:   ROS As per history of present illness. All pertinent systems were reviewed above. Constitutional, HEENT, cardiovascular, respiratory, GI, GU, musculoskeletal, neuro, psychiatric, endocrine, integumentary and hematologic systems were reviewed and are otherwise negative/unremarkable except for positive findings mentioned above in the HPI.   MEDICATIONS AT HOME:   Prior to Admission medications   Medication Sig Start Date End Date Taking? Authorizing Provider  acetaminophen  (TYLENOL ) 500 MG tablet Take 2 tablets (1,000 mg total) by mouth every 6 (six) hours as needed for mild pain, moderate pain, headache or fever. 04/12/22  Yes Danton Lauraine LABOR, PA-C  albuterol  (VENTOLIN  HFA) 108 (90 Base) MCG/ACT inhaler Inhale 2 puffs into the lungs every 6 (six) hours as needed for wheezing or shortness of breath. 12/07/20  Yes McDonough, Lauren K, PA-C  amitriptyline  (ELAVIL ) 50 MG tablet Take 50 mg by mouth at bedtime. 08/07/22  Yes [provider]  amLODipine  (NORVASC ) 5 MG tablet Take 5 mg by mouth daily. 04/28/20  Yes [provider]  atorvastatin  (LIPITOR) 20 MG tablet Take 20 mg by mouth daily. 03/21/21  Yes [provider]  azelastine  (ASTELIN ) 0.1 % nasal spray Place 1-2 sprays into both nostrils 2 (two) times daily.    Yes [provider]  Biotin 89999 MCG TABS Take 10 mg by mouth daily.    Yes [provider]  budesonide -formoterol  (SYMBICORT ) 80-4.5 MCG/ACT inhaler Inhale 2 puffs into the lungs 2 (two) times daily.   Yes [provider]  cetirizine (ZYRTEC) 10 MG tablet Take 10 mg by mouth at bedtime.    Yes [provider]  gabapentin  (NEURONTIN ) 800 MG tablet Take 800 mg by mouth 3 (three) times daily.   Yes [provider]  lidocaine  (LIDODERM ) 5 % Place 1 patch onto the skin  daily. Remove & Discard patch within 12 hours or as directed by MD 03/21/22  Yes Gershon Donnice SAUNDERS, DPM  montelukast  (SINGULAIR ) 10 MG tablet Take 10 mg by mouth at bedtime.   Yes [provider]  naloxone  (NARCAN ) nasal spray 4 mg/0.1 mL Take as prescribed for opoid overdose 01/02/22  Yes Gershon Donnice SAUNDERS, DPM  Olopatadine  HCl 0.2 % SOLN Apply to eye.   Yes [provider]  oxyCODONE  (ROXICODONE ) 15 MG immediate release tablet Take 1 tablet (15 mg total) by mouth every 6 (six) hours as needed for severe pain. 04/12/22  Yes McClung, Sarah A, PA-C  polyethylene glycol (MIRALAX  / GLYCOLAX ) 17 g packet Take 17 g by mouth daily as needed for mild constipation. 04/14/22  Yes  Cheryle Page, MD  promethazine  (PHENERGAN ) 25 MG tablet Take 1 tablet (25 mg total) by mouth every 8 (eight) hours as needed for nausea or vomiting. 01/02/22  Yes Gershon Donnice SAUNDERS, DPM  pyridOXINE (VITAMIN B-6) 100 MG tablet Take 100 mg by mouth daily.   Yes [provider]  rizatriptan  (MAXALT ) 10 MG tablet Take 10 mg by mouth as needed for migraine. May repeat in 2 hours if needed   Yes [provider]  tiZANidine  (ZANAFLEX ) 4 MG tablet Take 1 tablet (4 mg total) by mouth every 6 (six) hours as needed for muscle spasms. 04/12/22  Yes Danton Lauraine LABOR, PA-C  traZODone  (DESYREL ) 100 MG tablet Take 1 tablet (100 mg total) by mouth at bedtime. 04/09/22  Yes Krishnan, Sendil K, MD  valACYclovir  (VALTREX ) 1000 MG tablet Take 1,000 mg by mouth daily as needed.   Yes [provider]  vitamin C (ASCORBIC ACID) 500 MG tablet Take 500 mg by mouth daily.   Yes [provider]  Vitamin E 45 MG CAPS Take 90 mg by mouth daily.   Yes [provider]  albuterol  (PROVENTIL ) (2.5 MG/3ML) 0.083% nebulizer solution Take 2.5 mg by nebulization every 6 (six) hours as needed for wheezing or shortness of breath.    [provider]  ammonium lactate  (AMLACTIN) 12 % lotion Apply 1  application topically as needed for dry skin. Patient not taking: Reported on 09/12/2023 05/18/21   Gershon Donnice SAUNDERS, DPM  aspirin  EC 325 MG tablet Take 325 mg by mouth daily. 08/25/22   [provider]  Cholecalciferol 25 MCG (1000 UT) tablet Take 1,000 Units by mouth daily.     [provider]  ciclopirox  (PENLAC ) 8 % solution Apply topically at bedtime. Apply over nail and surrounding skin. Apply daily over previous coat. After seven (7) days, may remove with alcohol and continue cycle. Patient not taking: Reported on 09/12/2023 08/08/22   Gershon Donnice SAUNDERS, DPM  Cyanocobalamin (B-12) 1000 MCG TABS Take by mouth daily.    [provider]  diclofenac  Sodium (VOLTAREN ) 1 % GEL Apply topically. 09/04/20   [provider]  meloxicam (MOBIC) 15 MG tablet Take 15 mg by mouth daily. Patient not taking: Reported on 09/12/2023 11/02/22   [provider]  mirabegron  ER (MYRBETRIQ ) 50 MG TB24 tablet Take 1 tablet (50 mg total) by mouth daily. Patient not taking: Reported on 09/12/2023 03/30/21   Helon Kirsch A, PA-C  Multiple Vitamins-Minerals (CENTRUM ADULTS PO) Take 1 tablet by mouth daily.     [provider]  nortriptyline  (PAMELOR ) 10 MG capsule Take 10 mg by mouth 2 (two) times daily. Patient not taking: Reported on 09/12/2023 12/29/19   [provider]  pantoprazole  (PROTONIX ) 40 MG tablet Take 40 mg by mouth daily. Patient not taking: Reported on 09/12/2023    [provider]  senna (SENOKOT) 8.6 MG TABS tablet Take 1 tablet (8.6 mg total) by mouth 2 (two) times daily. Patient not taking: Reported on 09/12/2023 04/14/22   Cheryle Page, MD      VITAL SIGNS:  Blood pressure 138/78, pulse 90, temperature 98.5 F (36.9 C), resp. rate 16, height 5' 4 (1.626 m), weight 62.6 kg, SpO2 100%.  PHYSICAL EXAMINATION:  Physical Exam  GENERAL:  68 y.o.-year-old African-American female patient lying in the bed with no acute distress.   EYES: Pupils equal, round, reactive to light and accommodation. No scleral icterus. Extraocular muscles intact.  HEENT: Head atraumatic, normocephalic. Oropharynx and nasopharynx  clear.  NECK:  Supple, no jugular venous distention. No thyroid  enlargement, no tenderness.  LUNGS: Normal breath sounds bilaterally, no wheezing, rales,rhonchi or crepitation. No use of accessory muscles of respiration.  CARDIOVASCULAR: Regular rate and rhythm, S1, S2 normal. No murmurs, rubs, or gallops.  ABDOMEN: Soft, nondistended, nontender. Bowel sounds present. No organomegaly or mass.  EXTREMITIES: No pedal edema, cyanosis, or clubbing.  NEUROLOGIC: Cranial nerves II through XII are intact. Muscle strength 5/5 in all extremities. Sensation intact. Gait not checked.  PSYCHIATRIC: The patient is alert and oriented x 3.  Normal affect and good eye contact. SKIN: No obvious rash, lesion, or ulcer.   LABORATORY PANEL:   CBC Recent Labs  Lab 09/12/23 1610  WBC 13.2*  HGB 12.3  HCT 38.7  PLT 415*   ------------------------------------------------------------------------------------------------------------------  Chemistries  Recent Labs  Lab 09/12/23 1610  NA 140  K 2.7*  CL 104  CO2 25  GLUCOSE 71  BUN 25*  CREATININE 2.53*  CALCIUM  9.3  MG 2.0  AST 22  ALT 14  ALKPHOS 63  BILITOT 0.8   ------------------------------------------------------------------------------------------------------------------  Cardiac Enzymes No results for input(s): TROPONINI in the last 168 hours. ------------------------------------------------------------------------------------------------------------------  RADIOLOGY:  CT Head Wo Contrast Result Date: 09/12/2023 CLINICAL DATA:  Neuro deficit, concern for stroke, syncopal episode last night. Intermittent left facial numbness. EXAM: CT HEAD WITHOUT CONTRAST TECHNIQUE: Contiguous axial images were obtained from the base of the skull through the vertex without  intravenous contrast. RADIATION DOSE REDUCTION: This exam was performed according to the departmental dose-optimization program which includes automated exposure control, adjustment of the mA and/or kV according to patient size and/or use of iterative reconstruction technique. COMPARISON:  CT head 06/04/2019. FINDINGS: Brain: No acute intracranial hemorrhage. No CT evidence of acute infarct. No edema, mass effect, or midline shift. The basilar cisterns are patent. Ventricles: The ventricles are normal. Vascular: No hyperdense vessel or unexpected calcification. Skull: No acute or aggressive finding. Orbits: Orbits are symmetric. Sinuses: The visualized paranasal sinuses are clear. Other: Mastoid air cells are clear. IMPRESSION: No CT evidence of acute intracranial abnormality. Electronically Signed   By: Donnice Mania M.D.   On: 09/12/2023 19:12      IMPRESSION AND PLAN:  Assessment and Plan: * TIA (transient ischemic attack) - This is suspicious based on his left-sided facial numbness as well as left arm numbness and vertigo. - The patient will be admitted to an observation medically monitored bed.   - We will follow neuro checks q.4 hours for 24 hours.   - The patient will be placed on aspirin .   - Will obtain a brain MRI without contrast as well as  2D echo with bubble study .   - A neurology consultation  as well as physical/occupation/speech therapy consults will be obtained in a.m.SABRA - Notify Dr. Lindzen about the patient. - The patient will be placed on statin therapy and fasting lipids will be checked.   Hypokalemia - Potassium will be replaced and magnesium  level will be checked.  AKI (acute kidney injury) (HCC) - This is likely prerenal due to volume depletion and dehydration. - We will place her on hydration with IV normal saline with added potassium chloride . - Will avoid nephrotoxins. - We will follow BMP   Essential hypertension - We will continue antihypertensive  therapy. - Permissive hypertension will be allowed.  GERD without esophagitis - Will continue PPI therapy.  Chronic asthma - Will continue her bronchodilator therapy while holding of Symbicort . - Will resume  Singulair .  Peripheral neuropathy - Continue Neurontin .  Dyslipidemia - Will continue statin therapy. - Fasting lipids will be checked.   DVT prophylaxis: Lovenox .  Advanced Care Planning:  Code Status: full code.  Family Communication:  The plan of care was discussed in details with the patient (and family). I answered all questions. The patient agreed to proceed with the above mentioned plan. Further management will depend upon hospital course. Disposition Plan: Back to previous home environment Consults called: Neurology. All the records are reviewed and case discussed with ED provider.  Status is: Observation  I certify that at the time of admission, it is my clinical judgment that the patient will require hospital care extending less than 2 midnights.                            Dispo: The patient is from: Home              Anticipated d/c is to: Home              Patient currently is not medically stable to d/c.              Difficult to place patient: No  Madison DELENA Peaches M.D on 09/12/2023 at 10:31 PM  [facility]   From 7 PM-7 AM, contact night-coverage [email or web address]  CC: Primary care physician; George, Sionne A, MD

## 2023-09-13 ENCOUNTER — Observation Stay

## 2023-09-13 DIAGNOSIS — W19XXXA Unspecified fall, initial encounter: Secondary | ICD-10-CM | POA: Diagnosis not present

## 2023-09-13 DIAGNOSIS — R55 Syncope and collapse: Secondary | ICD-10-CM | POA: Diagnosis not present

## 2023-09-13 DIAGNOSIS — G459 Transient cerebral ischemic attack, unspecified: Secondary | ICD-10-CM | POA: Diagnosis not present

## 2023-09-13 LAB — BASIC METABOLIC PANEL WITH GFR
Anion gap: 10 (ref 5–15)
BUN: 12 mg/dL (ref 8–23)
CO2: 21 mmol/L — ABNORMAL LOW (ref 22–32)
Calcium: 9.4 mg/dL (ref 8.9–10.3)
Chloride: 110 mmol/L (ref 98–111)
Creatinine, Ser: 0.88 mg/dL (ref 0.44–1.00)
GFR, Estimated: >60 mL/min (ref >60)
Glucose, Bld: 87 mg/dL (ref 70–99)
Potassium: 4.3 mmol/L (ref 3.5–5.1)
Sodium: 141 mmol/L (ref 135–145)

## 2023-09-13 LAB — HEMOGLOBIN A1C
Hgb A1c MFr Bld: 5 % (ref 4.8–5.6)
Mean Plasma Glucose: 96.8 mg/dL

## 2023-09-13 LAB — CBC WITH DIFFERENTIAL/PLATELET
Abs Immature Granulocytes: 0.01 10*3/uL (ref 0.00–0.07)
Basophils Absolute: 0.1 10*3/uL (ref 0.0–0.1)
Basophils Relative: 1 %
Eosinophils Absolute: 0.3 10*3/uL (ref 0.0–0.5)
Eosinophils Relative: 3 %
HCT: 39.2 % (ref 36.0–46.0)
Hemoglobin: 12.8 g/dL (ref 12.0–15.0)
Immature Granulocytes: 0 %
Lymphocytes Relative: 46 %
Lymphs Abs: 3.7 10*3/uL (ref 0.7–4.0)
MCH: 29.4 pg (ref 26.0–34.0)
MCHC: 32.7 g/dL (ref 30.0–36.0)
MCV: 90.1 fL (ref 80.0–100.0)
Monocytes Absolute: 0.7 10*3/uL (ref 0.1–1.0)
Monocytes Relative: 8 %
Neutro Abs: 3.4 10*3/uL (ref 1.7–7.7)
Neutrophils Relative %: 42 %
Platelets: 382 10*3/uL (ref 150–400)
RBC: 4.35 MIL/uL (ref 3.87–5.11)
RDW: 13.4 % (ref 11.5–15.5)
WBC: 8.1 10*3/uL (ref 4.0–10.5)
nRBC: 0 % (ref 0.0–0.2)

## 2023-09-13 LAB — HIV ANTIBODY (ROUTINE TESTING W REFLEX): HIV Screen 4th Generation wRfx: NONREACTIVE

## 2023-09-13 LAB — LIPID PANEL
Cholesterol: 135 mg/dL (ref 0–200)
HDL: 62 mg/dL (ref >40)
LDL Cholesterol: 55 mg/dL (ref 0–99)
Total CHOL/HDL Ratio: 2.2 ratio
Triglycerides: 91 mg/dL (ref <150)
VLDL: 18 mg/dL (ref 0–40)

## 2023-09-13 LAB — TROPONIN I (HIGH SENSITIVITY): Troponin I (High Sensitivity): 3 ng/L (ref <18)

## 2023-09-13 MED ORDER — BLISTEX MEDICATED EX OINT
TOPICAL_OINTMENT | CUTANEOUS | Status: DC | PRN
  Filled 2023-09-13: qty 6.3

## 2023-09-13 MED ORDER — FLUTICASONE FUROATE-VILANTEROL 100-25 MCG/ACT IN AEPB
1.0000 | INHALATION_SPRAY | Freq: Every day | RESPIRATORY_TRACT | Status: DC
  Filled 2023-09-13: qty 28

## 2023-09-13 MED ORDER — POTASSIUM CHLORIDE 20 MEQ PO PACK
40.0000 meq | PACK | Freq: Once | ORAL | Status: AC
  Administered 2023-09-13: 40 meq via ORAL
  Filled 2023-09-13: qty 2

## 2023-09-13 MED ORDER — OXYCODONE HCL 5 MG PO TABS: 15.0000 mg | ORAL_TABLET | Freq: Four times a day (QID) | ORAL | Status: DC | PRN

## 2023-09-13 MED ORDER — PROMETHAZINE HCL 25 MG PO TABS: 25.0000 mg | ORAL_TABLET | Freq: Three times a day (TID) | ORAL | Status: DC | PRN

## 2023-09-13 MED ORDER — FENTANYL 50 MCG/HR TD PT72: 1.0000 | MEDICATED_PATCH | TRANSDERMAL | Status: DC

## 2023-09-13 MED ORDER — OXYCODONE HCL 5 MG PO TABS: 7.5000 mg | ORAL_TABLET | Freq: Four times a day (QID) | ORAL | Status: DC | PRN

## 2023-09-13 MED ORDER — AMITRIPTYLINE HCL 25 MG PO TABS
50.0000 mg | ORAL_TABLET | Freq: Every day | ORAL | Status: DC
  Administered 2023-09-13: 50 mg via ORAL
  Filled 2023-09-13: qty 2

## 2023-09-13 MED ORDER — ENOXAPARIN SODIUM 40 MG/0.4ML IJ SOSY
40.0000 mg | PREFILLED_SYRINGE | INTRAMUSCULAR | Status: DC
  Administered 2023-09-13: 40 mg via SUBCUTANEOUS
  Filled 2023-09-13: qty 0.4

## 2023-09-13 MED ORDER — AZELASTINE HCL 0.1 % NA SOLN
1.0000 | Freq: Two times a day (BID) | NASAL | Status: DC
  Administered 2023-09-13: 1 via NASAL
  Administered 2023-09-14: 2 via NASAL
  Filled 2023-09-13: qty 30

## 2023-09-13 MED ORDER — TIZANIDINE HCL 4 MG PO TABS: 4.0000 mg | ORAL_TABLET | Freq: Four times a day (QID) | ORAL | Status: DC | PRN

## 2023-09-13 MED ORDER — FENTANYL 75 MCG/HR TD PT72: 1.0000 | MEDICATED_PATCH | TRANSDERMAL | Status: DC

## 2023-09-13 MED ORDER — FENTANYL 75 MCG/HR TD PT72
1.0000 | MEDICATED_PATCH | TRANSDERMAL | Status: DC
  Filled 2023-09-13: qty 1

## 2023-09-13 NOTE — Evaluation (Signed)
 Physical Therapy Evaluation Patient Details Name: Rachael Jordan MRN: [id number] DOB: [birth year] Today's Date: 09/13/2023  History of Present Illness  Pt is a 68 y.o. female with medical history significant for osteoarthritis, GERD, anxiety, asthma, CVA and seasonal allergy, who presented to the emergency room with acute onset of left-sided facial numbness as well as left hand numbness. MD assessment includes TIA, hypokalemia, AKI, and dyslipidemia. MRI of brain wo contrast on 09/12/2023 impression states, no evidence of acute intracranial abnormality.  Clinical Impression  Pt was pleasant and motivated to participate during the session and put forth good effort throughout. Pt required CGA for STS and ambulation with RW. Pt was encouraged to maintain upright posture during ambulation, but stated that LBP was limiting her to do so. Pt was reliant on RW to maintain balance during ambulation, but remained steady throughout and no LOBs occurred. Pt required 2 standing rest breaks during ambulation to catch her breath. Despite pt reporting SOB during ambulation, SpO2 and HR were WNL throughout on room air. Pt will benefit from continued PT services upon discharge to safely address deficits listed in patient problem list for decreased caregiver assistance and eventual return to PLOF.        If plan is discharge home, recommend the following: A little help with walking and/or transfers;Assistance with cooking/housework;Assist for transportation;Help with stairs or ramp for entrance;A little help with bathing/dressing/bathroom   Can travel by private vehicle        Equipment Recommendations None recommended by PT  Recommendations for Other Services       Functional Status Assessment Patient has had a recent decline in their functional status and demonstrates the ability to make significant improvements in function in a reasonable and predictable amount of time.     Precautions / Restrictions  Precautions Precautions: Fall Recall of Precautions/Restrictions: Intact Restrictions Weight Bearing Restrictions Per Provider Order: No      Mobility  Bed Mobility               General bed mobility comments: Not assessed; pt received in chair    Transfers Overall transfer level: Needs assistance Equipment used: Rolling walker (2 wheels) Transfers: Sit to/from Stand Sit to Stand: Contact guard assist           General transfer comment: Pt required CGA for STS from chair with RW. Pt remained steady throughout and no LOBs occured    Ambulation/Gait Ambulation/Gait assistance: Contact guard assist Gait Distance (Feet): 50 Feet Assistive device: Rolling walker (2 wheels) Gait Pattern/deviations: Step-through pattern, Decreased step length - right, Decreased step length - left, Trunk flexed, Knee flexed in stance - left, Knee flexed in stance - right Gait velocity: decreased     General Gait Details: Pt required CGA for ambulation with RW. Pt flexed trunk over RW throughout and was encouraged to maintain upright posture, but pt stated she was unable due to LBP. Pt demonstrated minimal heel strike, hip flexion, and foot clearance bilaterally. Despite this, pt remained steady throughout and no LOBs occured. Pt required 2 standing rest breaks throughout to catch breath.  Stairs            Wheelchair Mobility     Tilt Bed    Modified Rankin (Stroke Patients Only)       Balance Overall balance assessment: Needs assistance Sitting-balance support: Feet supported, No upper extremity supported Sitting balance-Leahy Scale: Good     Standing balance support: During functional activity, Reliant on assistive device for balance, Bilateral upper  extremity supported Standing balance-Leahy Scale: Fair Standing balance comment: Pt is reliant on RW to maintain balance during stance and ambulation, but remained steady throughout and no LOBs occured.                              Pertinent Vitals/Pain Pain Assessment Pain Assessment: 0-10 Pain Score: 7  Pain Location: L shoulder and L chest Pain Descriptors / Indicators: Shooting, Sharp, Numbness, Spasm, Tingling Pain Intervention(s): Monitored during session    Home Living Family/patient expects to be discharged to:: Private residence Living Arrangements: Other relatives Available Help at Discharge: Family;Available 24 hours/day Type of Home: House Home Access: Stairs to enter Entrance Stairs-Rails: None Entrance Stairs-Number of Steps: 2 Alternate Level Stairs-Number of Steps: 14 with R handrail Home Layout: Two level;1/2 bath on main level Home Equipment: Agricultural consultant (2 wheels);Cane - single point;Rollator (4 wheels) Additional Comments: Lives with sister and nieces. Mother and adult children are also nearby to assist. Has large church community who can also assist with meals.    Prior Function Prior Level of Function : Independent/Modified Independent;Driving             Mobility Comments: Pt reported indepence with community ambulation distances without the use of AD. Pt reported 2 falls in past 6 months occuring due to becoming dizzy. ADLs Comments: Pt reported indepence with ADLs without the use of AD     Extremity/Trunk Assessment   Upper Extremity Assessment Upper Extremity Assessment: LUE deficits/detail;RUE deficits/detail RUE Deficits / Details: Strength via MMT: shoulder flexion 4/5, elbow flexion 4/5, elboe extension 4/5, grip strength 4+/5 RUE Sensation: WNL RUE Coordination: WNL LUE Deficits / Details: Functionally weak, limited assessment due to pain LUE: Unable to fully assess due to pain LUE Sensation: WNL LUE Coordination: WNL    Lower Extremity Assessment Lower Extremity Assessment: RLE deficits/detail;LLE deficits/detail RLE: Unable to fully assess due to pain RLE Sensation: WNL RLE Coordination: WNL LLE: Unable to fully assess due to pain LLE  Sensation: WNL LLE Coordination: WNL       Communication   Communication Communication: No apparent difficulties    Cognition Arousal: Alert Behavior During Therapy: WFL for tasks assessed/performed   PT - Cognitive impairments: No apparent impairments                         Following commands: Intact       Cueing Cueing Techniques: Verbal cues, Gestural cues     General Comments      Exercises     Assessment/Plan    PT Assessment Patient needs continued PT services  PT Problem List Decreased strength;Decreased mobility;Decreased range of motion;Decreased balance;Pain       PT Treatment Interventions DME instruction;Therapeutic exercise;Gait training;Balance training;Stair training;Functional mobility training;Therapeutic activities;Patient/family education    PT Goals (Current goals can be found in the Care Plan section)  Acute Rehab PT Goals Patient Stated Goal: to get back to being able to play with grandchildren without pain PT Goal Formulation: With patient Time For Goal Achievement: 09/26/23 Potential to Achieve Goals: Good    Frequency Min 2X/week     Co-evaluation               AM-PAC PT 6 Clicks Mobility  Outcome Measure Help needed turning from your back to your side while in a flat bed without using bedrails?: None Help needed moving from lying on your back to sitting  on the side of a flat bed without using bedrails?: None Help needed moving to and from a bed to a chair (including a wheelchair)?: A Little Help needed standing up from a chair using your arms (e.g., wheelchair or bedside chair)?: A Little Help needed to walk in hospital room?: A Little Help needed climbing 3-5 steps with a railing? : A Lot 6 Click Score: 19    End of Session Equipment Utilized During Treatment: Gait belt Activity Tolerance: Patient tolerated treatment well Patient left: in chair;with call bell/phone within reach Nurse Communication: Mobility  status PT Visit Diagnosis: Muscle weakness (generalized) (M62.81);History of falling (Z91.81);Difficulty in walking, not elsewhere classified (R26.2)    Time: 9049-8972 PT Time Calculation (min) (ACUTE ONLY): 37 min   Charges:                 Leontine Ingles, SPT 09/13/23, 12:54 PM

## 2023-09-13 NOTE — Progress Notes (Signed)
 SLP Cancellation Note  Patient Details Name: Rachael Jordan MRN: [id number] DOB: 04-07-1955   Cancelled treatment:       Reason Eval/Treat Not Completed: SLP screened, no needs identified, will sign off. Chart review completed. MRI and Head CT negative for intracranial abnormality. Pt alert, sitting in chair, denying cognitive communication concerns. Pt answering questions and following commands independently, oriented, and aware of situation. No acute speech therapy services indicated.  Rachael Fortune Brannigan Clapp, MS, CCC-SLP Speech Language Pathologist Rehab Services; [facility] [phone] (ascom)    Rachael Jordan 09/13/2023, 9:18 AM

## 2023-09-13 NOTE — Consult Note (Signed)
 NEUROLOGY CONSULT NOTE   Date of service: September 13, 2023 Patient Name: Rachael Jordan MRN:  [id number] DOB:  Jul 28, 1955 Chief Complaint: Left face and arm numbness Requesting Provider: Jhonny Calvin NOVAK, MD  History of Present Illness  Rachael Jordan is a 68 y.o. female with a PMHx of anxiety, arthritis, asthma, CVA, COPD, CKD, chronic pain syndrome, heart murmur, HTN, nephrolithiasis and scoliosis who presented to the ED yesterday afternoon after a syncopal episode at home the night before. She also complained of a two-night history of intermittent left facial numbness, which had resolved at the time of presentation. Symptoms had begun 3 nights PTA when she woke up in the middle of the night with left-sided numbness that lasted for at least an hour before she was able to go back to sleep. She then had a similar episode the next night with left-sided facial numbness, but also with numbness that affected her left hand. She did not notice any facial droop or weakness in her extremities and symptoms again resolved on their own. Subsequently, the night prior to admission, she had an episode where she began to feel dizzy with the room spinning around her, and had difficulty speaking at that time. She states that she ended up losing consciousness and fell onto her backside, but did not hit her head. She spoke with her PCP about this on Friday and was advised to go to the ED for evaluation.     ROS  No CP or SOB. Comprehensive ROS performed and pertinent positives documented in HPI    Past History   Past Medical History:  Diagnosis Date   Acid reflux    Anxiety    Arthritis    Asthma    Bronchitis 09/2015   CVA (cerebral vascular accident) (HCC)    Gross hematuria    H. pylori infection    Heart murmur    Kidney failure    Labile essential hypertension 01/06/2018   Lung abnormality    damaged lung due to pneumonia   Neck pain    Nephrolithiasis    Pneumonia    Pneumothorax     Scoliosis    Seasonal allergies    Urinary frequency    Vaginal atrophy     Past Surgical History:  Procedure Laterality Date   ABDOMINAL HYSTERECTOMY     vaginal   APPENDECTOMY     BREAST BIOPSY     COLONOSCOPY N/A 01/02/2020   Procedure: COLONOSCOPY;  Surgeon: Toledo, Ladell POUR, MD;  Location: [facility];  Service: Gastroenterology;  Laterality: N/A;   COLONOSCOPY WITH PROPOFOL  N/A 12/29/2018   Procedure: COLONOSCOPY WITH PROPOFOL ;  Surgeon: Jinny Carmine, MD;  Location: [facility]COPY;  Service: Endoscopy;  Laterality: N/A;   ESOPHAGOGASTRODUODENOSCOPY N/A 01/02/2020   Procedure: ESOPHAGOGASTRODUODENOSCOPY (EGD);  Surgeon: Toledo, Ladell POUR, MD;  Location: [facility]COPY;  Service: Gastroenterology;  Laterality: N/A;   FOOT SURGERY Left    10/2015   NECK SURGERY     ORIF TIBIA PLATEAU Left 04/10/2022   Procedure: OPEN REDUCTION INTERNAL FIXATION (ORIF) TIBIAL PLATEAU VERSUS;  Surgeon: Kendal Franky SQUIBB, MD;  Location: [facility] OR;  Service: Orthopedics;  Laterality: Left;    Family History: Family History  Problem Relation Age of Onset   Arthritis Mother    Asthma Mother    Hyperlipidemia Mother    Hypertension Mother    Diabetes Sister    Cancer Maternal Aunt        esophagus   Breast cancer Neg Hx  Kidney cancer Neg Hx    Bladder Cancer Neg Hx     Social History  reports that she has never smoked. She has never used smokeless tobacco. She reports that she does not drink alcohol and does not use drugs.  Allergies  Allergen Reactions   Grapefruit Extract Rash   Morphine  And Codeine Itching   Oxycodone  Itching   Oxymorphone Itching   Propoxyphene Nausea And Vomiting and Nausea Only    Medications   Current Facility-Administered Medications:     stroke: early stages of recovery book, , Does not apply, Once, Mansy, Jan A, MD   0.9 % NaCl with KCl 20 mEq/ L  infusion, , Intravenous, Continuous, Mansy, Jan A, MD, Last Rate: 100 mL/hr at 09/12/23 2359, New Bag at  09/12/23 2359   acetaminophen  (TYLENOL ) tablet 650 mg, 650 mg, Oral, Q4H PRN **OR** acetaminophen  (TYLENOL ) 160 MG/5ML solution 650 mg, 650 mg, Per Tube, Q4H PRN **OR** acetaminophen  (TYLENOL ) suppository 650 mg, 650 mg, Rectal, Q4H PRN, Mansy, Jan A, MD   albuterol  (PROVENTIL ) (2.5 MG/3ML) 0.083% nebulizer solution 2.5 mg, 2.5 mg, Nebulization, Q6H PRN, Mansy, Jan A, MD   amLODipine  (NORVASC ) tablet 5 mg, 5 mg, Oral, Daily, Mansy, Jan A, MD   ascorbic acid (VITAMIN C) tablet 500 mg, 500 mg, Oral, Daily, Mansy, Jan A, MD   aspirin  EC tablet 81 mg, 81 mg, Oral, Daily, Mansy, Jan A, MD   atorvastatin  (LIPITOR) tablet 20 mg, 20 mg, Oral, Daily, Mansy, Jan A, MD   cetirizine (ZYRTEC) tablet 10 mg, 10 mg, Oral, Daily, Mansy, Jan A, MD   cholecalciferol (VITAMIN D3) tablet 1,000 Units, 1,000 Units, Oral, Daily, Mansy, Jan A, MD   cyanocobalamin (VITAMIN B12) tablet 1,000 mcg, 1,000 mcg, Oral, Daily, Mansy, Jan A, MD   enoxaparin  (LOVENOX ) injection 30 mg, 30 mg, Subcutaneous, Q24H, Mansy, Jan A, MD, 30 mg at 09/12/23 2311   [START ON 09/14/2023] fentaNYL  (DURAGESIC ) 50 MCG/HR 1 patch, 1 patch, Transdermal, Q72H, Belue, Rankin RAMAN, RPH   gabapentin  (NEURONTIN ) capsule 800 mg, 800 mg, Oral, TID PRN, Mansy, Jan A, MD   montelukast  (SINGULAIR ) tablet 10 mg, 10 mg, Oral, QHS, Mansy, Jan A, MD, 10 mg at 09/12/23 2350   multivitamin with minerals tablet 1 tablet, 1 tablet, Oral, Daily, Mansy, Jan A, MD   naloxone  (NARCAN ) nasal spray 4 mg/0.1 mL, 1 spray, Nasal, Once PRN, Mansy, Jan A, MD   ondansetron  (ZOFRAN ) injection 4 mg, 4 mg, Intravenous, Q4H PRN, Mansy, Jan A, MD, 4 mg at 09/13/23 0004   polyethylene glycol (MIRALAX  / GLYCOLAX ) packet 17 g, 17 g, Oral, Daily PRN, Mansy, Jan A, MD   potassium chloride  (KLOR-CON ) packet 40 mEq, 40 mEq, Oral, Once, Mansy, Jan A, MD   pyridOXINE (VITAMIN B6) tablet 100 mg, 100 mg, Oral, Daily, Mansy, Jan A, MD   senna-docusate (Senokot-S) tablet 1 tablet, 1 tablet, Oral, QHS  PRN, Mansy, Jan A, MD   SUMAtriptan  (IMITREX ) tablet 50 mg, 50 mg, Oral, Q2H PRN, Mansy, Jan A, MD, 50 mg at 09/13/23 0530   traZODone  (DESYREL ) tablet 200 mg, 200 mg, Oral, QHS PRN, Mansy, Jan A, MD   Vitamin E CAPS 200 Units, 90 mg, Oral, Daily, Mansy, Jan A, MD  No current facility-administered medications on file prior to encounter.   Current Outpatient Medications on File Prior to Encounter  Medication Sig Dispense Refill   acetaminophen  (TYLENOL ) 500 MG tablet Take 2 tablets (1,000 mg total) by mouth every 6 (six) hours  as needed for mild pain, moderate pain, headache or fever.  0   albuterol  (VENTOLIN  HFA) 108 (90 Base) MCG/ACT inhaler Inhale 2 puffs into the lungs every 6 (six) hours as needed for wheezing or shortness of breath. 18 g 2   amitriptyline  (ELAVIL ) 50 MG tablet Take 50 mg by mouth at bedtime.     amLODipine  (NORVASC ) 5 MG tablet Take 5 mg by mouth daily.     atorvastatin  (LIPITOR) 20 MG tablet Take 20 mg by mouth daily.     azelastine  (ASTELIN ) 0.1 % nasal spray Place 1-2 sprays into both nostrils 2 (two) times daily.      Biotin 89999 MCG TABS Take 10 mg by mouth daily.      budesonide -formoterol  (SYMBICORT ) 80-4.5 MCG/ACT inhaler Inhale 2 puffs into the lungs 2 (two) times daily.     cetirizine (ZYRTEC) 10 MG tablet Take 10 mg by mouth at bedtime.      gabapentin  (NEURONTIN ) 800 MG tablet Take 800 mg by mouth 3 (three) times daily as needed (neuropathic pain).     lidocaine  (LIDODERM ) 5 % Place 1 patch onto the skin daily. Remove & Discard patch within 12 hours or as directed by MD 30 patch 0   montelukast  (SINGULAIR ) 10 MG tablet Take 10 mg by mouth at bedtime.     naloxone  (NARCAN ) nasal spray 4 mg/0.1 mL Take as prescribed for opoid overdose 1 each 1   Olopatadine  HCl 0.2 % SOLN Apply to eye.     oxyCODONE  (ROXICODONE ) 15 MG immediate release tablet Take 1 tablet (15 mg total) by mouth every 6 (six) hours as needed for severe pain. 28 tablet 0   polyethylene glycol  (MIRALAX  / GLYCOLAX ) 17 g packet Take 17 g by mouth daily as needed for mild constipation. 14 each 0   promethazine  (PHENERGAN ) 25 MG tablet Take 1 tablet (25 mg total) by mouth every 8 (eight) hours as needed for nausea or vomiting. 20 tablet 0   pyridOXINE (VITAMIN B-6) 100 MG tablet Take 100 mg by mouth daily.     rizatriptan  (MAXALT ) 10 MG tablet Take 10 mg by mouth as needed for migraine. May repeat in 2 hours if needed     tiZANidine  (ZANAFLEX ) 4 MG tablet Take 1 tablet (4 mg total) by mouth every 6 (six) hours as needed for muscle spasms. 30 tablet 0   traZODone  (DESYREL ) 100 MG tablet Take 1 tablet (100 mg total) by mouth at bedtime.     valACYclovir  (VALTREX ) 1000 MG tablet Take 1,000 mg by mouth daily as needed.     vitamin C (ASCORBIC ACID) 500 MG tablet Take 500 mg by mouth daily.     Vitamin E 45 MG CAPS Take 90 mg by mouth daily.     albuterol  (PROVENTIL ) (2.5 MG/3ML) 0.083% nebulizer solution Take 2.5 mg by nebulization every 6 (six) hours as needed for wheezing or shortness of breath.     ammonium lactate  (AMLACTIN) 12 % lotion Apply 1 application topically as needed for dry skin. (Patient not taking: Reported on 09/12/2023) 400 g 0   aspirin  EC 325 MG tablet Take 325 mg by mouth daily.     Cholecalciferol 25 MCG (1000 UT) tablet Take 1,000 Units by mouth daily.      ciclopirox  (PENLAC ) 8 % solution Apply topically at bedtime. Apply over nail and surrounding skin. Apply daily over previous coat. After seven (7) days, may remove with alcohol and continue cycle. (Patient not taking: Reported on 09/12/2023)  6.6 mL 0   Cyanocobalamin (B-12) 1000 MCG TABS Take by mouth daily.     diclofenac  Sodium (VOLTAREN ) 1 % GEL Apply topically.     meloxicam (MOBIC) 15 MG tablet Take 15 mg by mouth daily. (Patient not taking: Reported on 09-15-23)     mirabegron  ER (MYRBETRIQ ) 50 MG TB24 tablet Take 1 tablet (50 mg total) by mouth daily. (Patient not taking: Reported on 09-15-2023) 90 tablet 3    Multiple Vitamins-Minerals (CENTRUM ADULTS PO) Take 1 tablet by mouth daily.      nortriptyline  (PAMELOR ) 10 MG capsule Take 10 mg by mouth 2 (two) times daily. (Patient not taking: Reported on 09/15/23)     pantoprazole  (PROTONIX ) 40 MG tablet Take 40 mg by mouth daily. (Patient not taking: Reported on 2023-09-15)     senna (SENOKOT) 8.6 MG TABS tablet Take 1 tablet (8.6 mg total) by mouth 2 (two) times daily. (Patient not taking: Reported on 09/15/2023) 30 tablet 0    Vitals   Vitals:   09/15/23 2124 09/13/23 0200 09/13/23 0456 09/13/23 0834  BP: 138/78 (!) 120/59 114/60 (!) 160/102  Pulse: 90 75 72 89  Resp: 16 16  16   Temp: 98.5 F (36.9 C) 98.1 F (36.7 C) 98.4 F (36.9 C) 98 F (36.7 C)  TempSrc:  Oral    SpO2: 100% 99% 96% 100%  Weight: 62.6 kg     Height: 5' 4 (1.626 m)       Body mass index is 23.69 kg/m.   Physical Exam   Constitutional: Appears well-developed and well-nourished.  Eyes: No scleral injection.  HENT: No OP obstruction.  Head: Normocephalic.  Respiratory: Effort normal, non-labored breathing.    Neurologic Examination   Mental Status: Awake and alert. Oriented x 5. Thought content appropriate. Speech fluent with intact naming and comprehension. Able to follow all commands without difficulty. Cranial Nerves: II: Temporal visual fields intact with no extinction to DSS. PERRL. III,IV, VI: No ptosis. EOMI. No nystagmus. V: Temp sensation equal bilaterally VII: Smile symmetric VIII: Hearing intact to voice IX,X: No hypophonia or hoarseness XI: Symmetric XII: Midline tongue extension Motor: RUE: 5/5 LUE: 5/5 RLE: 5/5 LLE: 5/5 Normal muscle tone and bulk No pronator drift; left arm with coarse tremors that vary in frequency and amplitude when outstretched against gravity. The tremors appear non-physiological Sensory: Temp and FT intact x 4. No extinction to DSS. Deep Tendon Reflexes: 2+ and symmetric bilateral biceps and brachioradialis.  Deferred patellar reflexes due to arthritis pain. Cerebellar: No ataxia with FNF bilaterally. Non-physiological LUE tremor recurs during this maneuver in conjunction with erratic eye blinking. These resolve after completing cerebellar testing.  Gait: Deferred   Labs/Imaging/Neurodiagnostic studies   CBC:  Recent Labs  Lab Sep 15, 2023 1610  WBC 13.2*  HGB 12.3  HCT 38.7  MCV 91.9  PLT 415*   Basic Metabolic Panel:  Lab Results  Component Value Date   NA 140 September 15, 2023   K 2.7 (LL) 09-15-2023   CO2 25 09/15/23   GLUCOSE 71 Sep 15, 2023   BUN 25 (H) Sep 15, 2023   CREATININE 2.53 (H) 09-15-2023   CALCIUM  9.3 September 15, 2023   GFRNONAA 20 (L) 2023-09-15   GFRAA 58 (L) 06/18/2019   Lipid Panel:  Lab Results  Component Value Date   LDLCALC 55 09/13/2023   HgbA1c:  Lab Results  Component Value Date   HGBA1C 5.0 Sep 15, 2023   Urine Drug Screen:     Component Value Date/Time   COCAINSCRNUR Negative 06/06/2017 0736  LABBARB Negative 06/06/2017 0736    Alcohol Level No results found for: [facility] INR  Lab Results  Component Value Date   INR 0.9 12/28/2018     ASSESSMENT  Chelesea Cerro is a 68 y.o. female presenting with intermittent left face and arm numbness, as well as an episode of syncope with a fall.  - Neurological exam reveals a coarse LUE tremor that appears non-physiological and is distractible.  - CT head: No CT evidence of acute intracranial abnormality. - MRI brain: No evidence of acute intracranial abnormality.  - EKG: Normal sinus rhythm; Normal ECG - Cholesterol panel and HgbA1c are normal - Impression: Although there are functional attributes on her exam, her left sided sensory symptoms at home could be due to recurrent TIAs. Will need completion of stroke work up with TTE and vascular imaging of head and neck.   RECOMMENDATIONS  - TTE (ordered) - MRA head (ordered) - Carotid ultrasound (ordered) - Agree with starting ASA - Continue atorvastatin  - BP  management per standard protocol - Encourage hydration - PT consult, OT consult, Speech consult - Risk factor modification - Telemetry monitoring - Frequent neuro checks    ______________________________________________________________________    Bonney SHARK, Rachael Ulibarri, MD [facility]

## 2023-09-13 NOTE — Progress Notes (Signed)
 Unable to enter the pt's room in order to perform q4hr NIH due to Occupational Therapy evaluation occurring

## 2023-09-13 NOTE — Progress Notes (Signed)
 Patient alert and oriented x 4. No complaints voiced, asymptomatic. Will continue to monitor per protocol.  09/13/23 2032  Assess: MEWS Score  Temp 97.8 F (36.6 C)  BP (!) 143/74  MAP (mmHg) 95  Pulse Rate (!) 114  Resp 18  Level of Consciousness Alert  SpO2 98 %  O2 Device Room Air  Assess: MEWS Score  MEWS Temp 0  MEWS Systolic 0  MEWS Pulse 2  MEWS RR 0  MEWS LOC 0  MEWS Score 2  MEWS Score Color Yellow  Assess: if the MEWS score is Yellow or Red  Were vital signs accurate and taken at a resting state? Yes  Does the patient meet 2 or more of the SIRS criteria? No  MEWS guidelines implemented  Yes, yellow  Treat  MEWS Interventions Considered administering scheduled or prn medications/treatments as ordered  Take Vital Signs  Increase Vital Sign Frequency  Yellow: Q2hr x1, continue Q4hrs until patient remains green for 12hrs  Escalate  MEWS: Escalate Yellow: Discuss with charge nurse and consider notifying provider and/or RRT  Notify: Charge Nurse/RN  Name of Charge Nurse/RN Notified Arland, RN  Assess: SIRS CRITERIA  SIRS Temperature  0  SIRS Respirations  0  SIRS Pulse 1  SIRS WBC 0  SIRS Score Sum  1

## 2023-09-13 NOTE — Care Management Obs Status (Signed)
 MEDICARE OBSERVATION STATUS NOTIFICATION   Patient Details  Name: Rachael Jordan MRN: [id number] Date of Birth: [birth year]   Medicare Observation Status Notification Given:  Yes    Wang Granada W, CMA 09/13/2023, 11:25 AM

## 2023-09-13 NOTE — Progress Notes (Signed)
 PROGRESS NOTE    Rachael Jordan  FMW:[id number] DOB: 1955/07/30 DOA: 09/12/2023 PCP: Zachary Idelia LABOR, MD    Brief Narrative:  68 y.o. African-American female with medical history significant for osteoarthritis, GERD, anxiety, asthma, CVA and seasonal allergy, presenting to the emergency room with acute onset of left-sided facial numbness that started on Wednesday and left facial numbness as well as left hand numbness that occurred last night with associated cramping in the left hand.  Today she felt cramps in her right hand as well as both feet.  She had a syncopal episode as well last night when she was in the bathroom which was preceded by vertigo.  No headache or dizziness or blurred vision.  She has been having dry cough without wheezing or dyspnea.  No chest pain or palpitations.  No nausea or vomiting or abdominal pain.  No bleeding diathesis.   Seen by neurology.  Recommend full CVA workup.   Assessment & Plan:   Principal Problem:   TIA (transient ischemic attack) Active Problems:   Hypokalemia   AKI (acute kidney injury) (HCC)   Essential hypertension   GERD without esophagitis   Chronic asthma   Dyslipidemia   Peripheral neuropathy  * TIA (transient ischemic attack) - This is suspicious based on his left-sided facial numbness as well as left arm numbness and vertigo. Neurology consulted MRI negative Plan: TTE MRA head and neck BL carotid US  ASA 81 daily Further recs based on above studies Neurology following     Hypokalemia Replace prn   AKI (acute kidney injury) (HCC) Resolved Encourage PO fluid     Essential hypertension Normotensive BP goal  GERD without esophagitis PPI   Chronic asthma No exacerbation Continue home BD regimen  Peripheral neuropathy Chronic pain Resume home regimen   Dyslipidemia Statin      DVT prophylaxis: SQ lovenox  Code Status: Full Family Communication: None Disposition Plan: Status is: Observation The  patient will require care spanning > 2 midnights and should be moved to inpatient because: Symptoms concerning for recurrent TIA.  Neuro recommends full workup   Level of care: Telemetry Medical  Consultants:  Neurology  Procedures:  None  Antimicrobials: None    Subjective: Seen and examined.  No distress.  Neurologic symptoms have return to baseline  Objective: Vitals:   09/13/23 0200 09/13/23 0456 09/13/23 0834 09/13/23 1140  BP: (!) 120/59 114/60 (!) 160/102 132/71  Pulse: 75 72 89 82  Resp: 16  16 16   Temp: 98.1 F (36.7 C) 98.4 F (36.9 C) 98 F (36.7 C) 98.7 F (37.1 C)  TempSrc: Oral     SpO2: 99% 96% 100% 100%  Weight:      Height:        Intake/Output Summary (Last 24 hours) at 09/13/2023 1626 Last data filed at 09/13/2023 1430 Gross per 24 hour  Intake 1529.51 ml  Output --  Net 1529.51 ml   Filed Weights   09/12/23 1531 09/12/23 2015 09/12/23 2124  Weight: 64.8 kg 64.8 kg 62.6 kg    Examination:  General exam: Appears calm and comfortable  Respiratory system: Clear to auscultation. Respiratory effort normal. Cardiovascular system: S1S2, RRR, no murmur Gastrointestinal system:Soft NT ND, normal BS Central nervous system: Alert and oriented. No focal neurological deficits. Extremities: Symmetric 5 x 5 power. Skin: No rashes, lesions or ulcers Psychiatry: Judgement and insight appear normal. Mood & affect appropriate.     Data Reviewed: I have personally reviewed following labs and imaging studies  CBC: Recent  Labs  Lab 09/12/23 1610 09/13/23 0909  WBC 13.2* 8.1  NEUTROABS  --  3.4  HGB 12.3 12.8  HCT 38.7 39.2  MCV 91.9 90.1  PLT 415* 382   Basic Metabolic Panel: Recent Labs  Lab 09/12/23 1610 09/13/23 0909  NA 140 141  K 2.7* 4.3  CL 104 110  CO2 25 21*  GLUCOSE 71 87  BUN 25* 12  CREATININE 2.53* 0.88  CALCIUM  9.3 9.4  MG 2.0  --    GFR: Estimated Creatinine Clearance: 54.3 mL/min (by C-G formula based on SCr of 0.88  mg/dL). Liver Function Tests: Recent Labs  Lab 09/12/23 1610  AST 22  ALT 14  ALKPHOS 63  BILITOT 0.8  PROT 7.4  ALBUMIN 4.2   No results for input(s): LIPASE, AMYLASE in the last 168 hours. No results for input(s): AMMONIA in the last 168 hours. Coagulation Profile: No results for input(s): INR, PROTIME in the last 168 hours. Cardiac Enzymes: No results for input(s): CKTOTAL, CKMB, CKMBINDEX, TROPONINI in the last 168 hours. BNP (last 3 results) No results for input(s): PROBNP in the last 8760 hours. HbA1C: Recent Labs    09/12/23 1610  HGBA1C 5.0   CBG: No results for input(s): GLUCAP in the last 168 hours. Lipid Profile: Recent Labs    09/13/23 0557  CHOL 135  HDL 62  LDLCALC 55  TRIG 91  CHOLHDL 2.2   Thyroid  Function Tests: No results for input(s): TSH, T4TOTAL, FREET4, T3FREE, THYROIDAB in the last 72 hours. Anemia Panel: No results for input(s): VITAMINB12, FOLATE, FERRITIN, TIBC, IRON, RETICCTPCT in the last 72 hours. Sepsis Labs: No results for input(s): PROCALCITON, LATICACIDVEN in the last 168 hours.  No results found for this or any previous visit (from the past 240 hours).       Radiology Studies: MR BRAIN WO CONTRAST Result Date: 09/12/2023 CLINICAL DATA:  Transient ischemic attack (TIA) EXAM: MRI HEAD WITHOUT CONTRAST TECHNIQUE: Multiplanar, multiecho pulse sequences of the brain and surrounding structures were obtained without intravenous contrast. COMPARISON:  CT head from earlier today. FINDINGS: Brain: No acute infarction, hemorrhage, hydrocephalus, extra-axial collection or mass lesion. Mild T2/FLAIR hyperintensities in the white matter are nonspecific, but compatible with chronic microvascular disease. Vascular: Major arterial flow voids are maintained at the skull base. Skull and upper cervical spine: Normal marrow signal. Sinuses/Orbits: Clear sinuses.  No acute orbital findings. Other: No  mastoid effusions. IMPRESSION: No evidence of acute intracranial abnormality. Electronically Signed   By: Gilmore GORMAN Molt M.D.   On: 09/12/2023 22:51   CT Head Wo Contrast Result Date: 09/12/2023 CLINICAL DATA:  Neuro deficit, concern for stroke, syncopal episode last night. Intermittent left facial numbness. EXAM: CT HEAD WITHOUT CONTRAST TECHNIQUE: Contiguous axial images were obtained from the base of the skull through the vertex without intravenous contrast. RADIATION DOSE REDUCTION: This exam was performed according to the departmental dose-optimization program which includes automated exposure control, adjustment of the mA and/or kV according to patient size and/or use of iterative reconstruction technique. COMPARISON:  CT head 06/04/2019. FINDINGS: Brain: No acute intracranial hemorrhage. No CT evidence of acute infarct. No edema, mass effect, or midline shift. The basilar cisterns are patent. Ventricles: The ventricles are normal. Vascular: No hyperdense vessel or unexpected calcification. Skull: No acute or aggressive finding. Orbits: Orbits are symmetric. Sinuses: The visualized paranasal sinuses are clear. Other: Mastoid air cells are clear. IMPRESSION: No CT evidence of acute intracranial abnormality. Electronically Signed   By: Donnice Perley HERO.D.  On: 09/12/2023 19:12        Scheduled Meds:  amitriptyline   50 mg Oral QHS   amLODipine   5 mg Oral Daily   ascorbic acid  500 mg Oral Daily   aspirin  EC  81 mg Oral Daily   atorvastatin   20 mg Oral Daily   azelastine   1-2 spray Each Nare BID   cetirizine  10 mg Oral Daily   cholecalciferol  1,000 Units Oral Daily   cyanocobalamin  1,000 mcg Oral Daily   enoxaparin  (LOVENOX ) injection  40 mg Subcutaneous Q24H   [START ON 09/14/2023] fentaNYL   1 patch Transdermal Q72H   fluticasone  furoate-vilanterol  1 puff Inhalation Daily   montelukast   10 mg Oral QHS   multivitamin with minerals  1 tablet Oral Daily   pyridOXINE  100 mg Oral Daily    Vitamin E  90 mg Oral Daily   Continuous Infusions:   LOS: 0 days      Calvin KATHEE Robson, MD [facility]   If 7PM-7AM, please contact night-coverage  09/13/2023, 4:26 PM

## 2023-09-13 NOTE — Evaluation (Signed)
 Occupational Therapy Evaluation Patient Details Name: Rachael Jordan MRN: [id number] DOB: Jun 30, 1955 Today's Date: 09/13/2023   History of Present Illness   Rachael Jordan is a 68 y.o. African-American female with medical history significant for osteoarthritis, GERD, anxiety, asthma, CVA and seasonal allergy, presenting to the emergency room with acute onset of left-sided facial numbness and syncope. VSS upon presentation. Labs revealed hypokalemia of 2.7. Imaging negative for acute intercranial abnormalities.     Clinical Impressions Rachael Jordan was seen for OT evaluation this date. Prior to hospital admission, pt was generally independent with ADL/IADL management. Pt lives with her sister and two adult nieces. 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 Pt presents to acute OT demonstrating impaired ADL performance and functional mobility 2/2 decreased functional use of her LUE 2/2 increased tremors and weakness, decreased activity tolerance, and decreased safety awareness (See OT problem list for additional functional deficits). Pt currently requires CGA for safety during functional transfers and STS tasks.  Pt would benefit from skilled OT services while acutely hospitalized to address noted impairments and functional limitations (see below for any additional details) in order to maximize safety and independence while minimizing falls risk and caregiver burden. Do not anticipate the need for follow up OT services upon acute hospital DC.      If plan is discharge home, recommend the following:   A little help with walking and/or transfers;Assist for transportation;Assistance with cooking/housework     Functional Status Assessment   Patient has had a recent decline in their functional status and demonstrates the ability to make significant improvements in function in a reasonable and predictable amount of time.     Equipment Recommendations   None  recommended by OT     Recommendations for Other Services         Precautions/Restrictions   Precautions Precautions: Fall Recall of Precautions/Restrictions: Intact Restrictions Weight Bearing Restrictions Per Provider Order: No     Mobility Bed Mobility Overal bed mobility: Needs Assistance Bed Mobility: Supine to Sit     Supine to sit: Contact guard          Transfers Overall transfer level: Needs assistance Equipment used: 1 person hand held assist Transfers: Sit to/from Stand Sit to Stand: Contact guard assist                  Balance Overall balance assessment: Needs assistance Sitting-balance support: Feet supported, No upper extremity supported Sitting balance-Leahy Scale: Good     Standing balance support: During functional activity, Single extremity supported Standing balance-Leahy Scale: Fair                             ADL either performed or assessed with clinical judgement   ADL Overall ADL's : Needs assistance/impaired Eating/Feeding: Sitting;Modified independent Eating/Feeding Details (indicate cue type and reason): Able to open meal tray items, and feed self independently with increased time/effort to perform. Movements are slowed and limited coordination appreciated in LUE. Grooming: Sitting;Oral care;Modified independent Grooming Details (indicate cue type and reason): reports doing oral care independently earlier this date.                 Toilet Transfer: Pharmacist, community Details (indicate cue type and reason): ambulating to/from room commode with NT upon OT arrival. Toileting- Clothing Manipulation and Hygiene: Supervision/safety;Set up;Sit to/from stand       Functional mobility during ADLs: Contact guard assist;Cueing  for safety       Vision Baseline Vision/History: 1 Wears glasses Ability to See in Adequate Light: 1 Impaired Patient Visual Report: No change from  baseline       Perception         Praxis         Pertinent Vitals/Pain Pain Assessment Pain Assessment: 0-10 Pain Score: 7  Pain Location: L chest pain Pain Descriptors / Indicators: Shooting, Sharp Pain Intervention(s): Limited activity within patient's tolerance, Monitored during session (RN notified.)     Extremity/Trunk Assessment Upper Extremity Assessment Upper Extremity Assessment: Right hand dominant;LUE deficits/detail LUE Deficits / Details: LUE notably weak as compared to R. Increased tremors appreciated with intentional movements. Pt reports this has been ongoing issue. Endorses acute L sided tingling/numbness.   Lower Extremity Assessment Lower Extremity Assessment: Generalized weakness;Defer to PT evaluation       Communication Communication Communication: No apparent difficulties   Cognition Arousal: Alert Behavior During Therapy: WFL for tasks assessed/performed, Lability Cognition: No family/caregiver present to determine baseline             OT - Cognition Comments: Alert and oriented to self, place, and situation. Mildly labile during session, concerns regarding her care overnight.                 Following commands: Intact       Cueing  General Comments   Cueing Techniques: Verbal cues      Exercises Other Exercises Other Exercises: Pt educated on role of OT in acute setting, safety, falls prevention strategies, and safe use of AE/DME for ADL management.   Shoulder Instructions      Home Living Family/patient expects to be discharged to:: Private residence Living Arrangements: Other relatives Available Help at Discharge: Family;Available 24 hours/day Type of Home: House Home Access: Stairs to enter [employer] of Steps: 2   Home Layout: Two level;1/2 bath on main level Alternate Level Stairs-Number of Steps: full flight   Bathroom Shower/Tub: Chief Strategy Officer: Standard (riser)     Home  Equipment: Agricultural consultant (2 wheels);[place] - single point   Additional Comments: Lives with sister and nieces. Mother and adult children are also nearby to assist. Has large church community who can also assist with meals.      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 Generally independent with ADL/IADL, recenetly got back to walking independently after knee injury.    OT Problem List: Decreased strength;Decreased activity tolerance;Decreased coordination;Impaired balance (sitting and/or standing);Decreased knowledge of use of DME or AE;Decreased safety awareness;Impaired UE functional use   OT Treatment/Interventions: Self-care/ADL training;Therapeutic exercise;Energy conservation;Balance training;DME and/or AE instruction;Patient/family education;Therapeutic activities      OT Goals(Current goals can be found in the care plan section)   Acute Rehab OT Goals Patient Stated Goal: To go home OT Goal Formulation: With patient Time For Goal Achievement: 09/27/23 Potential to Achieve Goals: Good ADL Goals Pt Will Perform Grooming: sitting;with modified independence Pt Will Perform Lower Body Dressing: sit to/from stand;with modified independence;with adaptive equipment Pt Will Transfer to Toilet: ambulating;bedside commode;with modified independence Pt Will Perform Toileting - Clothing Manipulation and hygiene: with modified independence;sit to/from stand;with adaptive equipment   OT Frequency:  Min 1X/week    Co-evaluation  AM-PAC OT 6 Clicks Daily Activity     Outcome Measure Help from another person eating meals?: None Help from another person taking care of personal grooming?: None Help from another person toileting, which includes using toliet, bedpan, or urinal?: A Little Help from  another person bathing (including washing, rinsing, drying)?: A Little Help from another person to put on and taking off regular upper body clothing?: None Help from another person to put on and taking off regular lower body clothing?: A Little 6 Click Score: 21   End of Session    Activity Tolerance: Patient tolerated treatment well Patient left: in chair;with call bell/phone within reach;with chair alarm set  OT Visit Diagnosis: Other abnormalities of gait and mobility (R26.89)                Time: 9170-9099 OT Time Calculation (min): 31 min Charges:  OT General Charges $OT Visit: 1 Visit OT Evaluation $OT Eval Moderate Complexity: 1 Mod OT Treatments $Self Care/Home Management : 8-22 mins  Jhonny Pelton, M.S., OTR/L 09/13/23, 11:08 AM

## 2023-09-14 ENCOUNTER — Observation Stay (HOSPITAL_BASED_OUTPATIENT_CLINIC_OR_DEPARTMENT_OTHER)
Admit: 2023-09-14 | Discharge: 2023-09-14 | Disposition: A | Discharge status: Home / Self Care | Attending: Neurology | Admitting: Neurology

## 2023-09-14 DIAGNOSIS — G459 Transient cerebral ischemic attack, unspecified: Secondary | ICD-10-CM

## 2023-09-14 LAB — ECHOCARDIOGRAM COMPLETE BUBBLE STUDY
AR max vel: 1.54 cm2
AV Area VTI: 1.56 cm2
AV Area mean vel: 1.55 cm2
AV Mean grad: 5.5 mmHg
AV Peak grad: 11.3 mmHg
Ao pk vel: 1.68 m/s
Area-P 1/2: 5.13 cm2
Calc EF: 59.8 %
MV VTI: 1.84 cm2
P 1/2 time: 434 ms
S' Lateral: 3.1 cm
Single Plane A2C EF: 61.8 %
Single Plane A4C EF: 61.5 %

## 2023-09-14 NOTE — Progress Notes (Signed)
 Patient is alert and oriented X 4. Discharge instruction given. No any questions at this time. Peripheral I/v removed.

## 2023-09-14 NOTE — TOC Transition Note (Signed)
 Transition of Care [facility]) - Discharge Note   Patient Details  Name: Rachael Jordan MRN: [id number] Date of Birth: Aug 12, 1955  [facility] CM/SW Contact:  Marinda Cooks, RN Phone Number: 09/14/2023, 2:58 PM   Clinical Narrative:    This CM updated by covering MD pt medically cleared to dc today and has active DC order . This CM spoke with pt and confirmed her out patient PT preference  referral faxed . DC transportation confirmed for pt with family Medical team updated . No additional DC needs requested by medical team or identified by CM at this time .     Final next level of care: Home/Self Care    Name of family member notified: Patient Patient and family notified of of transfer: 09/14/23  Discharge Plan and Services Additional resources added to the After Visit Summary for Autism/IDD                   Social Drivers of Health (SDOH) Interventions SDOH Screenings   Food Insecurity: No Food Insecurity (09/13/2023)  Housing: Low Risk  (09/13/2023)  Transportation Needs: No Transportation Needs (09/13/2023)  Utilities: Not At Risk (09/13/2023)  Depression (PHQ2-9): Low Risk  (09/04/2023)  Financial Resource Strain: Low Risk  (08/23/2020)   Received from [facility]  Physical Activity: Inactive (08/23/2020)   Received from [facility]  Social Connections: Unknown (09/13/2023)  Stress: Stress Concern Present (08/23/2020)   Received from [facility]  Tobacco Use: Low Risk  (09/12/2023)     Readmission Risk Interventions     No data to display

## 2023-09-14 NOTE — Discharge Summary (Signed)
 Physician Discharge Summary  Rachael Jordan FMW:[id number] DOB: December 14, 1955 DOA: 09/12/2023  PCP: Zachary Idelia LABOR, MD  Admit date: 09/12/2023 Discharge date: 09/14/2023  Admitted From: Home Disposition:  Home  Recommendations for Outpatient Follow-up:  Follow up with PCP in 1-2 weeks Outpatient referral to physical therapy Outpatient referral to neurology  Home Health: No Equipment/Devices: None  Discharge Condition: Stable CODE STATUS: Full Diet recommendation: Regular  Brief/Interim Summary:  68 y.o. African-American female with medical history significant for osteoarthritis, GERD, anxiety, asthma, CVA and seasonal allergy, presenting to the emergency room with acute onset of left-sided facial numbness that started on Wednesday and left facial numbness as well as left hand numbness that occurred last night with associated cramping in the left hand.  Today she felt cramps in her right hand as well as both feet.  She had a syncopal episode as well last night when she was in the bathroom which was preceded by vertigo.  No headache or dizziness or blurred vision.  She has been having dry cough without wheezing or dyspnea.  No chest pain or palpitations.  No nausea or vomiting or abdominal pain.  No bleeding diathesis.    Seen by neurology.  Recommend full CVA workup.  Workup reassuring including MRI, MRA, carotid Doppler, 2D echocardiogram.    Discharge Diagnoses:  Principal Problem:   TIA (transient ischemic attack) Active Problems:   Hypokalemia   AKI (acute kidney injury) (HCC)   Essential hypertension   GERD without esophagitis   Chronic asthma   Dyslipidemia   Peripheral neuropathy   TIA (transient ischemic attack) Workup overall reassuring.  MRI/MRI negative.  Carotid duplex reassuring.  2D echocardiogram normal ejection fraction without intracardiac shunting Plan: Discharge home.  Aspirin  81 mg daily.  Outpatient referral to neurology 6 to 8 weeks Outpatient referral  to physical therapy.  Discharge Instructions  Discharge Instructions     Ambulatory referral to Neurology   Complete by: As directed    Ambulatory referral to Physical Therapy   Complete by: As directed    Diet - low sodium heart healthy   Complete by: As directed    Increase activity slowly   Complete by: As directed       Allergies as of 09/14/2023       Reactions   Grapefruit Extract Rash   Morphine  And Codeine Itching   Oxycodone  Itching   Oxymorphone Itching   Propoxyphene Nausea And Vomiting, Nausea Only        Medication List     STOP taking these medications    ammonium lactate  12 % lotion Commonly known as: AmLactin   ciclopirox  8 % solution Commonly known as: PENLAC    meloxicam 15 MG tablet Commonly known as: MOBIC   mirabegron  ER 50 MG Tb24 tablet Commonly known as: MYRBETRIQ    nortriptyline  10 MG capsule Commonly known as: PAMELOR    pantoprazole  40 MG tablet Commonly known as: PROTONIX    senna 8.6 MG Tabs tablet Commonly known as: SENOKOT       TAKE these medications    acetaminophen  500 MG tablet Commonly known as: TYLENOL  Take 2 tablets (1,000 mg total) by mouth every 6 (six) hours as needed for mild pain, moderate pain, headache or fever.   albuterol  (2.5 MG/3ML) 0.083% nebulizer solution Commonly known as: PROVENTIL  Take 2.5 mg by nebulization every 6 (six) hours as needed for wheezing or shortness of breath.   albuterol  108 (90 Base) MCG/ACT inhaler Commonly known as: VENTOLIN  HFA Inhale 2 puffs into the  lungs every 6 (six) hours as needed for wheezing or shortness of breath.   amitriptyline  50 MG tablet Commonly known as: ELAVIL  Take 50 mg by mouth at bedtime.   amLODipine  5 MG tablet Commonly known as: NORVASC  Take 5 mg by mouth daily.   ascorbic acid 500 MG tablet Commonly known as: VITAMIN C Take 500 mg by mouth daily.   aspirin  EC 325 MG tablet Take 325 mg by mouth daily.   atorvastatin  20 MG tablet Commonly  known as: LIPITOR Take 20 mg by mouth daily.   azelastine  0.1 % nasal spray Commonly known as: ASTELIN  Place 1-2 sprays into both nostrils 2 (two) times daily.   B-12 1000 MCG Tabs Take by mouth daily.   Biotin 10000 MCG Tabs Take 10 mg by mouth daily.   budesonide -formoterol  80-4.5 MCG/ACT inhaler Commonly known as: SYMBICORT  Inhale 2 puffs into the lungs 2 (two) times daily.   CENTRUM ADULTS PO Take 1 tablet by mouth daily.   cetirizine 10 MG tablet Commonly known as: ZYRTEC Take 10 mg by mouth at bedtime.   Cholecalciferol 25 MCG (1000 UT) tablet Take 1,000 Units by mouth daily.   diclofenac  Sodium 1 % Gel Commonly known as: VOLTAREN  Apply topically.   gabapentin  800 MG tablet Commonly known as: NEURONTIN  Take 800 mg by mouth 3 (three) times daily as needed (neuropathic pain).   lidocaine  5 % Commonly known as: Lidoderm  Place 1 patch onto the skin daily. Remove & Discard patch within 12 hours or as directed by MD   montelukast  10 MG tablet Commonly known as: SINGULAIR  Take 10 mg by mouth at bedtime.   naloxone  4 MG/0.1ML Liqd nasal spray kit Commonly known as: NARCAN  Take as prescribed for opoid overdose   Olopatadine  HCl 0.2 % Soln Apply to eye.   oxyCODONE  15 MG immediate release tablet Commonly known as: Roxicodone  Take 1 tablet (15 mg total) by mouth every 6 (six) hours as needed for severe pain.   polyethylene glycol 17 g packet Commonly known as: MIRALAX  / GLYCOLAX  Take 17 g by mouth daily as needed for mild constipation.   promethazine  25 MG tablet Commonly known as: PHENERGAN  Take 1 tablet (25 mg total) by mouth every 8 (eight) hours as needed for nausea or vomiting.   pyridOXINE 100 MG tablet Commonly known as: VITAMIN B6 Take 100 mg by mouth daily.   rizatriptan  10 MG tablet Commonly known as: MAXALT  Take 10 mg by mouth as needed for migraine. May repeat in 2 hours if needed   tiZANidine  4 MG tablet Commonly known as: ZANAFLEX  Take  1 tablet (4 mg total) by mouth every 6 (six) hours as needed for muscle spasms.   traZODone  100 MG tablet Commonly known as: DESYREL  Take 1 tablet (100 mg total) by mouth at bedtime.   valACYclovir  1000 MG tablet Commonly known as: VALTREX  Take 1,000 mg by mouth daily as needed.   Vitamin E 45 MG Caps Take 90 mg by mouth daily.        Follow-up Information     Zachary Idelia LABOR, MD Follow up.   Specialty: Family Medicine Why: Hospital follow up Contact information: [address] [place] [address] [phone]                Allergies  Allergen Reactions   Grapefruit Extract Rash   Morphine  And Codeine Itching   Oxycodone  Itching   Oxymorphone Itching   Propoxyphene Nausea And Vomiting and Nausea Only  Consultations: Neurology   Procedures/Studies: ECHOCARDIOGRAM COMPLETE BUBBLE STUDY Result Date: 09/14/2023    ECHOCARDIOGRAM REPORT   Patient Name:   Rachael Jordan Date of Exam: 09/14/2023 Medical Rec #:  [id number]        Height:       64.0 in Accession #:    [id number]       Weight:       138.0 lb Date of Birth:  September 06, 1955        BSA:          1.671 m Patient Age:    68 years         BP:           124/78 mmHg Patient Gender: F                HR:           87 bpm. Exam Location:  [facility] Procedure: 2D Echo, Color Doppler, Cardiac Doppler and Saline Contrast Bubble            Study (Both Spectral and Color Flow Doppler were utilized during            procedure). Indications:     435.9/G45.9 TIA  History:         Patient has prior history of Echocardiogram examinations.                  Asthma and Stroke; Signs/Symptoms:Murmur.  Sonographer:     L. Thornton-Maynard Referring Phys:  [id number] ERIC LINDZEN Diagnosing Phys: Redell Cave MD IMPRESSIONS  1. Left ventricular ejection fraction, by estimation, is 55 to 60%. Left ventricular ejection fraction by 2D MOD biplane is 59.8 %. The left ventricle has normal function. The left ventricle has no regional wall motion  abnormalities. Left ventricular diastolic parameters were normal.  2. Right ventricular systolic function is normal. The right ventricular size is normal. There is normal pulmonary artery systolic pressure.  3. The mitral valve is normal in structure. Mild mitral valve regurgitation.  4. The aortic valve is tricuspid. Aortic valve regurgitation is trivial. Aortic valve sclerosis is present, with no evidence of aortic valve stenosis.  5. The inferior vena cava is normal in size with greater than 50% respiratory variability, suggesting right atrial pressure of 3 mmHg.  6. Agitated saline contrast bubble study was negative, with no evidence of any interatrial shunt. FINDINGS  Left Ventricle: Left ventricular ejection fraction, by estimation, is 55 to 60%. Left ventricular ejection fraction by 2D MOD biplane is 59.8 %. The left ventricle has normal function. The left ventricle has no regional wall motion abnormalities. The left ventricular internal cavity size was normal in size. There is no left ventricular hypertrophy. Left ventricular diastolic parameters were normal. Right Ventricle: The right ventricular size is normal. No increase in right ventricular wall thickness. Right ventricular systolic function is normal. There is normal pulmonary artery systolic pressure. The tricuspid regurgitant velocity is 2.27 m/s, and  with an assumed right atrial pressure of 3 mmHg, the estimated right ventricular systolic pressure is 23.6 mmHg. Left Atrium: Left atrial size was normal in size. Right Atrium: Right atrial size was normal in size. Pericardium: There is no evidence of pericardial effusion. Mitral Valve: The mitral valve is normal in structure. Mild mitral valve regurgitation. MV peak gradient, 7.3 mmHg. The mean mitral valve gradient is 4.0 mmHg. Tricuspid Valve: The tricuspid valve is normal in structure. Tricuspid valve regurgitation is mild. Aortic Valve: The aortic valve is tricuspid. Aortic  valve regurgitation is  trivial. Aortic regurgitation PHT measures 434 msec. Aortic valve sclerosis is present, with no evidence of aortic valve stenosis. Aortic valve mean gradient measures 5.5 mmHg. Aortic valve peak gradient measures 11.3 mmHg. Aortic valve area, by VTI measures 1.56 cm. Pulmonic Valve: The pulmonic valve was normal in structure. Pulmonic valve regurgitation is trivial. Aorta: The aortic root is normal in size and structure. Venous: The inferior vena cava is normal in size with greater than 50% respiratory variability, suggesting right atrial pressure of 3 mmHg. IAS/Shunts: No atrial level shunt detected by color flow Doppler. Agitated saline contrast was given intravenously to evaluate for intracardiac shunting. Agitated saline contrast bubble study was negative, with no evidence of any interatrial shunt.  LEFT VENTRICLE PLAX 2D                        Biplane EF (MOD) LVIDd:         4.70 cm         LV Biplane EF:   Left LVIDs:         3.10 cm                          ventricular LV PW:         0.90 cm                          ejection LV IVS:        1.00 cm                          fraction by LVOT diam:     1.80 cm                          2D MOD LV SV:         45                               biplane is LV SV Index:   27                               59.8 %. LVOT Area:     2.54 cm                                Diastology                                LV e' medial:    5.55 cm/s LV Volumes (MOD)               LV E/e' medial:  17.1 LV vol d, MOD    63.1 ml       LV e' lateral:   13.50 cm/s A2C:                           LV E/e' lateral: 7.0 LV vol d, MOD    79.2 ml A4C: LV vol s, MOD    24.1 ml A2C: LV vol s, MOD    30.5 ml A4C: LV SV MOD A2C:  39.0 ml LV SV MOD A4C:   79.2 ml LV SV MOD BP:    42.3 ml RIGHT VENTRICLE             IVC RV Basal diam:  3.30 cm     IVC diam: 1.50 cm RV Mid diam:    2.30 cm RV S prime:     15.10 cm/s TAPSE (M-mode): 1.5 cm LEFT ATRIUM             Index        RIGHT ATRIUM           Index  LA diam:        3.10 cm 1.86 cm/m   RA Area:     10.90 cm LA Vol (A2C):   59.3 ml 35.49 ml/m  RA Volume:   23.20 ml  13.88 ml/m LA Vol (A4C):   49.5 ml 29.62 ml/m LA Biplane Vol: 55.1 ml 32.97 ml/m  AORTIC VALVE                     PULMONIC VALVE AV Area (Vmax):    1.54 cm      PV Vmax:       1.07 m/s AV Area (Vmean):   1.55 cm      PV Peak grad:  4.6 mmHg AV Area (VTI):     1.56 cm AV Vmax:           168.00 cm/s AV Vmean:          107.000 cm/s AV VTI:            0.285 m AV Peak Grad:      11.3 mmHg AV Mean Grad:      5.5 mmHg LVOT Vmax:         102.00 cm/s LVOT Vmean:        65.000 cm/s LVOT VTI:          0.175 m LVOT/AV VTI ratio: 0.61 AI PHT:            434 msec  AORTA Ao Root diam: 3.10 cm Ao Asc diam:  3.70 cm MITRAL VALVE               TRICUSPID VALVE MV Area (PHT): 5.13 cm    TR Peak grad:   20.6 mmHg MV Area VTI:   1.84 cm    TR Vmax:        227.00 cm/s MV Peak grad:  7.3 mmHg MV Mean grad:  4.0 mmHg    SHUNTS MV Vmax:       1.35 m/s    Systemic VTI:  0.18 m MV Vmean:      88.1 cm/s   Systemic Diam: 1.80 cm MV Decel Time: 148 msec MV E velocity: 94.90 cm/s MV A velocity: 81.20 cm/s MV E/A ratio:  1.17 Redell Cave MD Electronically signed by Redell Cave MD Signature Date/Time: 09/14/2023/12:41:27 PM    Final    MR ANGIO HEAD WO CONTRAST Result Date: 09/14/2023 CLINICAL DATA:  Transient ischemic attack (TIA) EXAM: MRA HEAD WITHOUT CONTRAST TECHNIQUE: Angiographic images of the Circle of Willis were acquired using MRA technique without intravenous contrast. COMPARISON:  MRI head and CT head September 12, 2023. FINDINGS: Anterior circulation: Bilateral intracranial ICAs, MCAs, and ACAs are patent without proximal hemodynamically significant stenosis. No aneurysm identified. Posterior circulation: Bilateral intradural vertebral arteries, basilar artery, and bilateral posterior cerebral arteries are patent without proximal hemodynamically significant stenosis. No aneurysm identified.  IMPRESSION: No large vessel occlusion or proximal hemodynamically significant stenosis. Electronically Signed   By: Gilmore GORMAN Molt M.D.   On: 09/14/2023 01:25   US  Carotid Bilateral Result Date: 09/13/2023 CLINICAL DATA:  32385 TIA (transient ischemic attack) 67614 EXAM: BILATERAL CAROTID DUPLEX ULTRASOUND TECHNIQUE: Elnor scale imaging, color Doppler and duplex ultrasound were performed of bilateral carotid and vertebral arteries in the neck. COMPARISON:  02/18/2019 FINDINGS: Criteria: Quantification of carotid stenosis is based on velocity parameters that correlate the residual internal carotid diameter with NASCET-based stenosis levels, using the diameter of the distal internal carotid lumen as the denominator for stenosis measurement. The following velocity measurements were obtained: RIGHT ICA: 73/21 cm/sec CCA: 75/21 cm/sec SYSTOLIC ICA/CCA RATIO:  1.0 ECA: 76 cm/sec LEFT ICA: 93/31 cm/sec CCA: 62/15 cm/sec SYSTOLIC ICA/CCA RATIO:  1.5 ECA: 64 cm/sec RIGHT CAROTID ARTERY: Mild eccentric noncalcified plaque in the carotid bulb and proximal ICA. No significant stenosis. Normal waveforms and color Doppler signal throughout. RIGHT VERTEBRAL ARTERY:  Normal flow direction and waveform. LEFT CAROTID ARTERY: No plaque or stenosis. Normal waveforms and color Doppler signal. LEFT VERTEBRAL ARTERY:  Normal flow direction and waveform. IMPRESSION: 1. Mild right carotid bulb and proximal ICA plaque without significant stenosis. 2. No left carotid plaque or stenosis. 3. Antegrade bilateral vertebral arterial flow. Electronically Signed   By: JONETTA Faes M.D.   On: 09/13/2023 20:00   MR BRAIN WO CONTRAST Result Date: 09/12/2023 CLINICAL DATA:  Transient ischemic attack (TIA) EXAM: MRI HEAD WITHOUT CONTRAST TECHNIQUE: Multiplanar, multiecho pulse sequences of the brain and surrounding structures were obtained without intravenous contrast. COMPARISON:  CT head from earlier today. FINDINGS: Brain: No acute infarction,  hemorrhage, hydrocephalus, extra-axial collection or mass lesion. Mild T2/FLAIR hyperintensities in the white matter are nonspecific, but compatible with chronic microvascular disease. Vascular: Major arterial flow voids are maintained at the skull base. Skull and upper cervical spine: Normal marrow signal. Sinuses/Orbits: Clear sinuses.  No acute orbital findings. Other: No mastoid effusions. IMPRESSION: No evidence of acute intracranial abnormality. Electronically Signed   By: Gilmore GORMAN Molt M.D.   On: 09/12/2023 22:51   CT Head Wo Contrast Result Date: 09/12/2023 CLINICAL DATA:  Neuro deficit, concern for stroke, syncopal episode last night. Intermittent left facial numbness. EXAM: CT HEAD WITHOUT CONTRAST TECHNIQUE: Contiguous axial images were obtained from the base of the skull through the vertex without intravenous contrast. RADIATION DOSE REDUCTION: This exam was performed according to the departmental dose-optimization program which includes automated exposure control, adjustment of the mA and/or kV according to patient size and/or use of iterative reconstruction technique. COMPARISON:  CT head 06/04/2019. FINDINGS: Brain: No acute intracranial hemorrhage. No CT evidence of acute infarct. No edema, mass effect, or midline shift. The basilar cisterns are patent. Ventricles: The ventricles are normal. Vascular: No hyperdense vessel or unexpected calcification. Skull: No acute or aggressive finding. Orbits: Orbits are symmetric. Sinuses: The visualized paranasal sinuses are clear. Other: Mastoid air cells are clear. IMPRESSION: No CT evidence of acute intracranial abnormality. Electronically Signed   By: Donnice Mania M.D.   On: 09/12/2023 19:12      Subjective: Seen and examined on the day of discharge.  Stable no distress.  Appropriate for discharge home.  Discharge Exam: Vitals:   09/14/23 0734 09/14/23 1209  BP: 121/68 125/69  Pulse: 100 93  Resp: 16 18  Temp: 98.5 F (36.9 C) 98.6 F  (37 C)  SpO2: 100% 100%   Vitals:   09/13/23 2328 09/14/23 0418 09/14/23 9265  09/14/23 1209  BP: 112/64 124/78 121/68 125/69  Pulse: 89 96 100 93  Resp:   16 18  Temp: 98 F (36.7 C) 98.1 F (36.7 C) 98.5 F (36.9 C) 98.6 F (37 C)  TempSrc:  Oral Oral   SpO2: 99% 98% 100% 100%  Weight:      Height:        General: Pt is alert, awake, not in acute distress Cardiovascular: RRR, S1/S2 +, no rubs, no gallops Respiratory: CTA bilaterally, no wheezing, no rhonchi Abdominal: Soft, NT, ND, bowel sounds + Extremities: no edema, no cyanosis    The results of significant diagnostics from this hospitalization (including imaging, microbiology, ancillary and laboratory) are listed below for reference.     Microbiology: No results found for this or any previous visit (from the past 240 hours).   Labs: BNP (last 3 results) No results for input(s): BNP in the last 8760 hours. Basic Metabolic Panel: Recent Labs  Lab 09/12/23 1610 09/13/23 0909  NA 140 141  K 2.7* 4.3  CL 104 110  CO2 25 21*  GLUCOSE 71 87  BUN 25* 12  CREATININE 2.53* 0.88  CALCIUM  9.3 9.4  MG 2.0  --    Liver Function Tests: Recent Labs  Lab 09/12/23 1610  AST 22  ALT 14  ALKPHOS 63  BILITOT 0.8  PROT 7.4  ALBUMIN 4.2   No results for input(s): LIPASE, AMYLASE in the last 168 hours. No results for input(s): AMMONIA in the last 168 hours. CBC: Recent Labs  Lab 09/12/23 1610 09/13/23 0909  WBC 13.2* 8.1  NEUTROABS  --  3.4  HGB 12.3 12.8  HCT 38.7 39.2  MCV 91.9 90.1  PLT 415* 382   Cardiac Enzymes: No results for input(s): CKTOTAL, CKMB, CKMBINDEX, TROPONINI in the last 168 hours. BNP: Invalid input(s): POCBNP CBG: No results for input(s): GLUCAP in the last 168 hours. D-Dimer No results for input(s): DDIMER in the last 72 hours. Hgb A1c Recent Labs    09/12/23 1610  HGBA1C 5.0   Lipid Profile Recent Labs    09/13/23 0557  CHOL 135  HDL 62  LDLCALC  55  TRIG 91  CHOLHDL 2.2   Thyroid  function studies No results for input(s): TSH, T4TOTAL, T3FREE, THYROIDAB in the last 72 hours.  Invalid input(s): FREET3 Anemia work up No results for input(s): VITAMINB12, FOLATE, FERRITIN, TIBC, IRON, RETICCTPCT in the last 72 hours. Urinalysis    Component Value Date/Time   COLORURINE YELLOW (A) 09/12/2023 2109   APPEARANCEUR CLEAR (A) 09/12/2023 2109   APPEARANCEUR Clear 12/07/2020 0854   LABSPEC 1.008 09/12/2023 2109   LABSPEC 1.009 06/27/2014 1414   PHURINE 6.0 09/12/2023 2109   GLUCOSEU NEGATIVE 09/12/2023 2109   GLUCOSEU Negative 06/27/2014 1414   HGBUR NEGATIVE 09/12/2023 2109   BILIRUBINUR NEGATIVE 09/12/2023 2109   BILIRUBINUR Negative 12/07/2020 0854   BILIRUBINUR Negative 06/27/2014 1414   KETONESUR NEGATIVE 09/12/2023 2109   PROTEINUR NEGATIVE 09/12/2023 2109   NITRITE NEGATIVE 09/12/2023 2109   LEUKOCYTESUR TRACE (A) 09/12/2023 2109   LEUKOCYTESUR Negative 06/27/2014 1414   Sepsis Labs Recent Labs  Lab 09/12/23 1610 09/13/23 0909  WBC 13.2* 8.1   Microbiology No results found for this or any previous visit (from the past 240 hours).   Time coordinating discharge: 35 minutes  SIGNED:   Calvin KATHEE Robson, MD  [facility] 09/14/2023, 1:49 PM Pager   If 7PM-7AM, please contact night-coverage

## 2023-09-14 NOTE — Progress Notes (Signed)
     [facility] REFERRAL        Physical Therapy                            DATE 09/14/23   PATIENT NAME Rachael Jordan   PATIENT MRN :[id number]        DIAGNOSIS/DIAGNOSIS CODE   DATE OF DISCHARGE: 09/14/23        PRIMARY CARE PHYSICIAN      PCP PHONE/FAX      Dear Provider (Name:   [facility] Phone : [phone]  Fax: [phone]  I certify that I have examined this patient and that occupational/physical/speech therapy is necessary on an outpatient basis.    The patient has expressed interest in completing their recommended course of therapy at your  location.  Once a formal order from the patient's primary care physician has been obtained, please  contact him/her to schedule an appointment for evaluation at your earliest convenience.   [ x]  Physical Therapy Evaluate and Treat  [  ]  Occupational Therapy Evaluate and Treat  [  ]  Speech Therapy Evaluate and Treat         The patient's primary care physician (listed above) must furnish and be responsible for a formal order such that the recommended services may be furnished while under the primary physician's care, and that the plan of care will be established and reviewed every 30 days (or more often if condition necessitates).

## 2023-09-14 NOTE — Plan of Care (Signed)
  Problem: Self-Care: Goal: Ability to participate in self-care as condition permits will improve Outcome: Progressing Goal: Verbalization of feelings and concerns over difficulty with self-care will improve Outcome: Progressing Goal: Ability to communicate needs accurately will improve Outcome: Progressing   Problem: Nutrition: Goal: Risk of aspiration will decrease Outcome: Progressing Goal: Dietary intake will improve Outcome: Progressing   Problem: Clinical Measurements: Goal: Ability to maintain clinical measurements within normal limits will improve Outcome: Progressing Goal: Will remain free from infection Outcome: Progressing Goal: Diagnostic test results will improve Outcome: Progressing Goal: Respiratory complications will improve Outcome: Progressing Goal: Cardiovascular complication will be avoided Outcome: Progressing   Problem: Nutrition: Goal: Adequate nutrition will be maintained Outcome: Progressing   Problem: Coping: Goal: Level of anxiety will decrease Outcome: Progressing

## 2023-09-14 NOTE — Plan of Care (Signed)
  Problem: Education: Goal: Knowledge of disease or condition will improve Outcome: Progressing Goal: Knowledge of secondary prevention will improve (MUST DOCUMENT ALL) Outcome: Progressing Goal: Knowledge of patient specific risk factors will improve (DELETE if not current risk factor) Outcome: Progressing   Problem: Ischemic Stroke/TIA Tissue Perfusion: Goal: Complications of ischemic stroke/TIA will be minimized Outcome: Progressing   Problem: Self-Care: Goal: Ability to participate in self-care as condition permits will improve Outcome: Progressing Goal: Verbalization of feelings and concerns over difficulty with self-care will improve Outcome: Progressing Goal: Ability to communicate needs accurately will improve Outcome: Progressing   Problem: Education: Goal: Knowledge of General Education information will improve Description: Including pain rating scale, medication(s)/side effects and non-pharmacologic comfort measures Outcome: Progressing   Problem: Health Behavior/Discharge Planning: Goal: Ability to manage health-related needs will improve Outcome: Progressing   Problem: Activity: Goal: Risk for activity intolerance will decrease Outcome: Progressing

## 2023-09-16 ENCOUNTER — Ambulatory Visit

## 2023-09-16 NOTE — Progress Notes (Deleted)
 Patient presents today to pick up custom molded foot orthotics, diagnosed with Metatarsalgia by Dr. Janit.   Orthotics were dispensed and fit was satisfactory. Reviewed instructions for break-in and wear. Written instructions given to patient.  Patient will follow up as needed.  Lolita Schultze Cped, CFo, CFm

## 2023-09-17 ENCOUNTER — Telehealth: Payer: Self-pay | Admitting: Internal Medicine

## 2023-09-17 NOTE — Telephone Encounter (Signed)
 Left vm and sent mychart message to confirm 09/24/23 appointment-Toni

## 2023-09-24 ENCOUNTER — Ambulatory Visit (INDEPENDENT_AMBULATORY_CARE_PROVIDER_SITE_OTHER): Payer: Medicare HMO | Admitting: Internal Medicine

## 2023-09-24 DIAGNOSIS — R0602 Shortness of breath: Secondary | ICD-10-CM

## 2023-09-28 NOTE — Procedures (Signed)
 [facility] [address] [place], [address]    Complete Pulmonary Function Testing Interpretation:  FINDINGS:  Forced vital capacity is normal.  FEV1 is normal.  FEV1 FVC ratio is normal.  Postbronchodilator there was no significant change in the FEV1.  Clinical improvement may still occur.  TLC is normal.  Residual volume is normal.  FRC is normal.  The DLCO was within normal limits  IMPRESSION:  This pulmonary function study is within normal limits clinical correlation is recommended  Rachael DELENA Bathe, MD [facility] Pulmonary Critical Care Medicine Sleep Medicine

## 2023-09-30 ENCOUNTER — Telehealth: Payer: Self-pay | Admitting: Student in an Organized Health Care Education/Training Program

## 2023-09-30 NOTE — Telephone Encounter (Signed)
 Returned patient phone call re; upcoming procedure, LESI etc.  She will be in ECT, educated not to eat past midnight and bring a driver.  Also, she is taking ASA 325 mg asked her not to take that today or tomorrow.  She may include her BP meds in the morning but hold off on anything else she does not need to take until after procedure when she is able to eat.  Patient verbalizes u/o information.

## 2023-09-30 NOTE — Telephone Encounter (Signed)
 PT has some questions about the procedure on tomorrow. Please give patient a call. TY

## 2023-10-01 ENCOUNTER — Other Ambulatory Visit: Payer: Self-pay | Admitting: Student in an Organized Health Care Education/Training Program

## 2023-10-01 ENCOUNTER — Encounter: Payer: Self-pay | Admitting: Student in an Organized Health Care Education/Training Program

## 2023-10-01 ENCOUNTER — Ambulatory Visit
Admission: RE | Admit: 2023-10-01 | Discharge: 2023-10-01 | Disposition: A | Discharge status: Home / Self Care | Source: Ambulatory Visit | Attending: Student in an Organized Health Care Education/Training Program | Admitting: Student in an Organized Health Care Education/Training Program

## 2023-10-01 ENCOUNTER — Ambulatory Visit (HOSPITAL_BASED_OUTPATIENT_CLINIC_OR_DEPARTMENT_OTHER): Admitting: Student in an Organized Health Care Education/Training Program

## 2023-10-01 DIAGNOSIS — G8929 Other chronic pain: Secondary | ICD-10-CM

## 2023-10-01 DIAGNOSIS — G894 Chronic pain syndrome: Secondary | ICD-10-CM

## 2023-10-01 DIAGNOSIS — M5416 Radiculopathy, lumbar region: Secondary | ICD-10-CM

## 2023-10-01 NOTE — Progress Notes (Unsigned)
 Safety precautions to be maintained throughout the outpatient stay will include: orient to surroundings, keep bed in low position, maintain call bell within reach at all times, provide assistance with transfer out of bed and ambulation.

## 2023-10-02 ENCOUNTER — Ambulatory Visit: Payer: Medicare HMO | Admitting: Physician Assistant

## 2023-10-02 NOTE — Progress Notes (Signed)
 Patient rescheduled as she was on 325 aspirin .

## 2023-10-03 ENCOUNTER — Ambulatory Visit: Admitting: Podiatry

## 2023-10-06 ENCOUNTER — Telehealth: Payer: Self-pay

## 2023-10-06 ENCOUNTER — Encounter: Payer: Self-pay | Admitting: Internal Medicine

## 2023-10-06 ENCOUNTER — Telehealth: Payer: Self-pay | Admitting: Physician Assistant

## 2023-10-06 ENCOUNTER — Telehealth: Payer: Self-pay | Admitting: Internal Medicine

## 2023-10-06 ENCOUNTER — Telehealth: Payer: Self-pay | Admitting: Student in an Organized Health Care Education/Training Program

## 2023-10-06 ENCOUNTER — Ambulatory Visit: Admitting: Internal Medicine

## 2023-10-06 VITALS — BP 130/78 | HR 84 | Temp 97.8°F | Resp 16 | Ht 62.0 in | Wt 152.0 lb

## 2023-10-06 DIAGNOSIS — G4733 Obstructive sleep apnea (adult) (pediatric): Secondary | ICD-10-CM | POA: Diagnosis not present

## 2023-10-06 DIAGNOSIS — J4520 Mild intermittent asthma, uncomplicated: Secondary | ICD-10-CM | POA: Diagnosis not present

## 2023-10-06 NOTE — Telephone Encounter (Signed)
 Awaiting 10/06/23 office notes for SS order-Toni

## 2023-10-06 NOTE — Telephone Encounter (Signed)
 PT called to see if her clearance has been fax over from her doctor. PT was schedule to get procedure done last week had to R/S. However patient had to be off of blood thiner. Please give patient a call. TY

## 2023-10-06 NOTE — Telephone Encounter (Signed)
 Call to patient to let her know we have no received an update from Dr. Sionne George from [facility]. Informed her that I will re-fax clearance request and give the office a call as well. Informed her as well to NOT stop ASA 325mg  until we receive approval from her PCP. Patient verbalized understanding.

## 2023-10-06 NOTE — Telephone Encounter (Signed)
 Call to patient. Please see previous note.

## 2023-10-06 NOTE — Patient Instructions (Signed)

## 2023-10-06 NOTE — Telephone Encounter (Signed)
 error

## 2023-10-06 NOTE — Progress Notes (Unsigned)
 [facility] [address] [place], [place] [address]  Pulmonary Sleep Medicine   Office Visit Note  Patient Name: Rachael Jordan DOB: 1955-05-16 MRN [id number]  Date of Service: 10/06/2023  Complaints/HPI: Overall she is doing well. She was in the hospital for what appears to be a TIA with facial numbness. She is taking ASA now for daily use. Her PFT was within normal limits. She states she is not using her CPAP because of multiple other issues. She also feels that there is too much air and wondering if she needs the device. She had a titration in 2008 and this was for a pressure of 8CWP. She has lost weight and may need pressure adjustment  Office Spirometry Results:     ROS  General: (-) fever, (-) chills, (-) night sweats, (-) weakness Skin: (-) rashes, (-) itching,. Eyes: (-) visual changes, (-) redness, (-) itching. Nose and Sinuses: (-) nasal stuffiness or itchiness, (-) postnasal drip, (-) nosebleeds, (-) sinus trouble. Mouth and Throat: (-) sore throat, (-) hoarseness. Neck: (-) swollen glands, (-) enlarged thyroid , (-) neck pain. Respiratory: - cough, (-) bloody sputum, - shortness of breath, - wheezing. Cardiovascular: - ankle swelling, (-) chest pain. Lymphatic: (-) lymph node enlargement. Neurologic: (-) numbness, (-) tingling. Psychiatric: (-) anxiety, (-) depression   Current Medication: Outpatient Encounter Medications as of 10/06/2023  Medication Sig   acetaminophen  (TYLENOL ) 500 MG tablet Take 2 tablets (1,000 mg total) by mouth every 6 (six) hours as needed for mild pain, moderate pain, headache or fever.   albuterol  (PROVENTIL ) (2.5 MG/3ML) 0.083% nebulizer solution Take 2.5 mg by nebulization every 6 (six) hours as needed for wheezing or shortness of breath.   albuterol  (VENTOLIN  HFA) 108 (90 Base) MCG/ACT inhaler Inhale 2 puffs into the lungs every 6 (six) hours as needed for wheezing or shortness of breath.   amitriptyline  (ELAVIL ) 50 MG  tablet Take 50 mg by mouth at bedtime.   amLODipine  (NORVASC ) 5 MG tablet Take 5 mg by mouth daily.   aspirin  EC 325 MG tablet Take 325 mg by mouth daily. (Patient taking differently: Take 325 mg by mouth daily. Has not taken since 09/30/23)   atorvastatin  (LIPITOR) 20 MG tablet Take 20 mg by mouth daily.   azelastine  (ASTELIN ) 0.1 % nasal spray Place 1-2 sprays into both nostrils 2 (two) times daily.    Biotin  10000 MCG TABS Take 10 mg by mouth daily.    budesonide -formoterol  (SYMBICORT ) 80-4.5 MCG/ACT inhaler Inhale 2 puffs into the lungs 2 (two) times daily.   cetirizine  (ZYRTEC ) 10 MG tablet Take 10 mg by mouth at bedtime.    Cholecalciferol  25 MCG (1000 UT) tablet Take 1,000 Units by mouth daily.    Cyanocobalamin  (B-12) 1000 MCG TABS Take by mouth daily.   diclofenac  Sodium (VOLTAREN ) 1 % GEL Apply topically.   gabapentin  (NEURONTIN ) 800 MG tablet Take 800 mg by mouth 3 (three) times daily as needed (neuropathic pain).   lidocaine  (LIDODERM ) 5 % Place 1 patch onto the skin daily. Remove & Discard patch within 12 hours or as directed by MD   montelukast  (SINGULAIR ) 10 MG tablet Take 10 mg by mouth at bedtime.   Multiple Vitamins-Minerals (CENTRUM ADULTS PO) Take 1 tablet by mouth daily.    naloxone  (NARCAN ) nasal spray 4 mg/0.1 mL Take as prescribed for opoid overdose   Olopatadine  HCl 0.2 % SOLN Apply to eye.   oxyCODONE  (ROXICODONE ) 15 MG immediate release tablet Take 1 tablet (15 mg total) by  mouth every 6 (six) hours as needed for severe pain.   polyethylene glycol (MIRALAX  / GLYCOLAX ) 17 g packet Take 17 g by mouth daily as needed for mild constipation.   promethazine  (PHENERGAN ) 25 MG tablet Take 1 tablet (25 mg total) by mouth every 8 (eight) hours as needed for nausea or vomiting.   pyridOXINE  (VITAMIN B-6) 100 MG tablet Take 100 mg by mouth daily.   rizatriptan  (MAXALT ) 10 MG tablet Take 10 mg by mouth as needed for migraine. May repeat in 2 hours if needed   tiZANidine  (ZANAFLEX )  4 MG tablet Take 1 tablet (4 mg total) by mouth every 6 (six) hours as needed for muscle spasms.   traZODone  (DESYREL ) 100 MG tablet Take 1 tablet (100 mg total) by mouth at bedtime.   valACYclovir  (VALTREX ) 1000 MG tablet Take 1,000 mg by mouth daily as needed.   vitamin C  (ASCORBIC ACID ) 500 MG tablet Take 500 mg by mouth daily.   Vitamin E  45 MG CAPS Take 90 mg by mouth daily.   No facility-administered encounter medications on file as of 10/06/2023.    Surgical History: Past Surgical History:  Procedure Laterality Date   ABDOMINAL HYSTERECTOMY     vaginal   APPENDECTOMY     BREAST BIOPSY     COLONOSCOPY N/A 01/02/2020   Procedure: COLONOSCOPY;  Surgeon: Toledo, Ladell POUR, MD;  Location: [facility] ENDOSCOPY;  Service: Gastroenterology;  Laterality: N/A;   COLONOSCOPY WITH PROPOFOL  N/A 12/29/2018   Procedure: COLONOSCOPY WITH PROPOFOL ;  Surgeon: Jinny Carmine, MD;  Location: [facility] ENDOSCOPY;  Service: Endoscopy;  Laterality: N/A;   CORONARY ARTERY BYPASS GRAFT     ESOPHAGOGASTRODUODENOSCOPY N/A 01/02/2020   Procedure: ESOPHAGOGASTRODUODENOSCOPY (EGD);  Surgeon: Toledo, Ladell POUR, MD;  Location: [facility] ENDOSCOPY;  Service: Gastroenterology;  Laterality: N/A;   FOOT SURGERY Left    10/2015   NECK SURGERY     ORIF TIBIA PLATEAU Left 04/10/2022   Procedure: OPEN REDUCTION INTERNAL FIXATION (ORIF) TIBIAL PLATEAU VERSUS;  Surgeon: Kendal Franky SQUIBB, MD;  Location: [facility] OR;  Service: Orthopedics;  Laterality: Left;    Medical History: Past Medical History:  Diagnosis Date   Acid reflux    Anxiety    Arthritis    Asthma    Bronchitis 09/2015   CVA (cerebral vascular accident) (HCC)    Gross hematuria    H. pylori infection    Heart murmur    Kidney failure    Labile essential hypertension 01/06/2018   Lung abnormality    damaged lung due to pneumonia   Neck pain    Nephrolithiasis    Pneumonia    Pneumothorax    Scoliosis    Seasonal allergies    Urinary frequency    Vaginal  atrophy     Family History: Family History  Problem Relation Age of Onset   Arthritis Mother    Asthma Mother    Hyperlipidemia Mother    Hypertension Mother    Diabetes Sister    Cancer Maternal Aunt        esophagus   Breast cancer Neg Hx    Kidney cancer Neg Hx    Bladder Cancer Neg Hx     Social History: Social History   Socioeconomic History   Marital status: Legally Separated    Spouse name: Not on file   Number of children: Not on file   Years of education: Not on file   Highest education level: Not on file  Occupational History   Not  on file  Tobacco Use   Smoking status: Never   Smokeless tobacco: Never  Vaping Use   Vaping status: Never Used  Substance and Sexual Activity   Alcohol use: No    Alcohol/week: 0.0 standard drinks of alcohol   Drug use: No   Sexual activity: Not on file  Other Topics Concern   Not on file  Social History Narrative   Not on file   Social Drivers of Health   Financial Resource Strain: Low Risk  (08/23/2020)   Received from [facility]   Overall Financial Resource Strain (CARDIA)    Difficulty of Paying Living Expenses: Not hard at all  Food Insecurity: No Food Insecurity (09/13/2023)   Hunger Vital Sign    Worried About Running Out of Food in the Last Year: Never true    Ran Out of Food in the Last Year: Never true  Transportation Needs: No Transportation Needs (09/13/2023)   PRAPARE - Administrator, Civil Service (Medical): No    Lack of Transportation (Non-Medical): No  Physical Activity: Inactive (08/23/2020)   Received from [facility]   Exercise Vital Sign    On average, how many days per week do you engage in moderate to strenuous exercise (like a brisk walk)?: 0 days    On average, how many minutes do you engage in exercise at this level?: 0 min  Stress: Stress Concern Present (08/23/2020)   Received from [facility] - Occupational Stress Questionnaire    Feeling of Stress : To some extent  Social Connections: Unknown (09/13/2023)   Social Connection and Isolation Panel    Frequency of Communication with Friends and Family: Three times a week    Frequency of Social Gatherings with Friends and Family: Three times a week    Attends Religious Services: More than 4 times per year    Active Member of Clubs or Organizations: Not on file    Attends Club or Organization Meetings: More than 4 times per year    Marital Status: Patient declined  Intimate Partner Violence: Not At Risk (09/13/2023)   Humiliation, Afraid, Rape, and Kick questionnaire    Fear of Current or Ex-Partner: No    Emotionally Abused: No    Physically Abused: No    Sexually Abused: No    Vital Signs: Blood pressure 130/78, pulse 84, temperature 97.8 F (36.6 C), resp. rate 16, height 5' 2 (1.575 m), weight 152 lb (68.9 kg), SpO2 95%.  Examination: General Appearance: The patient is well-developed, well-nourished, and in no distress. Skin: Gross inspection of skin unremarkable. Head: normocephalic, no gross deformities. Eyes: no gross deformities noted. ENT: ears appear grossly normal no exudates. Neck: Supple. No thyromegaly. No LAD. Respiratory: no rhonchi noted. Cardiovascular: Normal S1 and S2 without murmur or rub. Extremities: No cyanosis. pulses are equal. Neurologic: Alert and oriented. No involuntary movements.  LABS: Recent Results (from the past 2160 hours)  Comprehensive metabolic panel     Status: Abnormal   Collection Time: 09/12/23  4:10 PM  Result Value Ref Range   Sodium 140 135 - 145 mmol/L   Potassium 2.7 (LL) 3.5 - 5.1 mmol/L    Comment: CRITICAL RESULT CALLED TO, READ BACK BY AND VERIFIED WITH AMBER PYNE, RN 09/12/2023 1714 JL    Chloride 104 98 - 111 mmol/L   CO2 25 22 - 32 mmol/L   Glucose, Bld 71 70 - 99  mg/dL    Comment: Glucose reference range applies only to samples taken after fasting for  at least 8 hours.   BUN 25 (H) 8 - 23 mg/dL   Creatinine, Ser 7.46 (H) 0.44 - 1.00 mg/dL   Calcium  9.3 8.9 - 10.3 mg/dL   Total Protein 7.4 6.5 - 8.1 g/dL   Albumin 4.2 3.5 - 5.0 g/dL   AST 22 15 - 41 U/L   ALT 14 0 - 44 U/L   Alkaline Phosphatase 63 38 - 126 U/L   Total Bilirubin 0.8 0.0 - 1.2 mg/dL   GFR, Estimated 20 (L) >60 mL/min    Comment: (NOTE) Calculated using the CKD-EPI Creatinine Equation (2021)    Anion gap 11 5 - 15    Comment: Performed at [facility], [address] [place], [place] [address]  CBC     Status: Abnormal   Collection Time: 09/12/23  4:10 PM  Result Value Ref Range   WBC 13.2 (H) 4.0 - 10.5 K/uL   RBC 4.21 3.87 - 5.11 MIL/uL   Hemoglobin 12.3 12.0 - 15.0 g/dL   HCT 61.2 63.9 - 53.9 %   MCV 91.9 80.0 - 100.0 fL   MCH 29.2 26.0 - 34.0 pg   MCHC 31.8 30.0 - 36.0 g/dL   RDW 86.4 88.4 - 84.4 %   Platelets 415 (H) 150 - 400 K/uL   nRBC 0.0 0.0 - 0.2 %    Comment: Performed at [facility], [address] [place], [place] [address]  Troponin I (High Sensitivity)     Status: None   Collection Time: 09/12/23  4:10 PM  Result Value Ref Range   Troponin I (High Sensitivity) 7 <18 ng/L    Comment: (NOTE) Elevated high sensitivity troponin I (hsTnI) values and significant  changes across serial measurements may suggest ACS but many other  chronic and acute conditions are known to elevate hsTnI results.  Refer to the Links section for chest pain algorithms and additional  guidance. Performed at [facility], [address] [place], [place] [address]   Magnesium      Status: None   Collection Time: 09/12/23  4:10 PM  Result Value Ref Range   Magnesium  2.0 1.7 - 2.4 mg/dL    Comment: Performed at [facility] [address] [place], [place] [address]  Hemoglobin A1c     Status: None   Collection Time: 09/12/23  4:10 PM  Result Value Ref Range   Hgb A1c MFr Bld 5.0 4.8 - 5.6 %    Comment: (NOTE) Diagnosis  of Diabetes The following HbA1c ranges recommended by the American Diabetes Association (ADA) may be used as an aid in the diagnosis of diabetes mellitus.  Hemoglobin             Suggested A1C NGSP%              Diagnosis  <5.7                   Non Diabetic  5.7-6.4                Pre-Diabetic  >6.4                   Diabetic  <7.0                   Glycemic control for  adults with diabetes.     Mean Plasma Glucose 96.8 mg/dL    Comment: Performed at [facility], [address], [place], [place] [address]  Urinalysis, Routine w reflex microscopic -Urine, Clean Catch     Status: Abnormal   Collection Time: 09/12/23  9:09 PM  Result Value Ref Range   Color, Urine YELLOW (A) YELLOW   APPearance CLEAR (A) CLEAR   Specific Gravity, Urine 1.008 1.005 - 1.030   pH 6.0 5.0 - 8.0   Glucose, UA NEGATIVE NEGATIVE mg/dL   Hgb urine dipstick NEGATIVE NEGATIVE   Bilirubin Urine NEGATIVE NEGATIVE   Ketones, ur NEGATIVE NEGATIVE mg/dL   Protein, ur NEGATIVE NEGATIVE mg/dL   Nitrite NEGATIVE NEGATIVE   Leukocytes,Ua TRACE (A) NEGATIVE   RBC / HPF 0-5 0 - 5 RBC/hpf   WBC, UA 0-5 0 - 5 WBC/hpf   Bacteria, UA RARE (A) NONE SEEN   Squamous Epithelial / HPF 0-5 0 - 5 /HPF   Mucus PRESENT    Hyaline Casts, UA PRESENT     Comment: Performed at [facility], [address] [place], [place] [address]  Lipid panel     Status: None   Collection Time: 09/13/23  5:57 AM  Result Value Ref Range   Cholesterol 135 0 - 200 mg/dL   Triglycerides 91 <849 mg/dL   HDL 62 >59 mg/dL   Total CHOL/HDL Ratio 2.2 RATIO   VLDL 18 0 - 40 mg/dL   LDL Cholesterol 55 0 - 99 mg/dL    Comment:        Total Cholesterol/HDL:CHD Risk Coronary Heart Disease Risk Table                     Men   Women  1/2 Average Risk   3.4   3.3  Average Risk       5.0   4.4  2 X Average Risk   9.6   7.1  3 X Average Risk  23.4   11.0        Use the calculated Patient Ratio above and the  CHD Risk Table to determine the patient's CHD Risk.        ATP III CLASSIFICATION (LDL):  <100     mg/dL   Optimal  899-870  mg/dL   Near or Above                    Optimal  130-159  mg/dL   Borderline  839-810  mg/dL   High  >809     mg/dL   Very High Performed at [facility], [address] [place], [place] [address]   HIV Antibody (routine testing w rflx)     Status: None   Collection Time: 09/13/23  5:57 AM  Result Value Ref Range   HIV Screen 4th Generation wRfx Non Reactive Non Reactive    Comment: Performed at [facility], [address] [place], [place] [address]  CBC with Differential/Platelet     Status: None   Collection Time: 09/13/23  9:09 AM  Result Value Ref Range   WBC 8.1 4.0 - 10.5 K/uL   RBC 4.35 3.87 - 5.11 MIL/uL   Hemoglobin 12.8 12.0 - 15.0 g/dL   HCT 60.7 63.9 - 53.9 %   MCV 90.1 80.0 - 100.0 fL   MCH 29.4 26.0 - 34.0 pg   MCHC 32.7 30.0 - 36.0 g/dL   RDW 13.4  11.5 - 15.5 %   Platelets 382 150 - 400 K/uL   nRBC 0.0 0.0 - 0.2 %   Neutrophils Relative % 42 %   Neutro Abs 3.4 1.7 - 7.7 K/uL   Lymphocytes Relative 46 %   Lymphs Abs 3.7 0.7 - 4.0 K/uL   Monocytes Relative 8 %   Monocytes Absolute 0.7 0.1 - 1.0 K/uL   Eosinophils Relative 3 %   Eosinophils Absolute 0.3 0.0 - 0.5 K/uL   Basophils Relative 1 %   Basophils Absolute 0.1 0.0 - 0.1 K/uL   Immature Granulocytes 0 %   Abs Immature Granulocytes 0.01 0.00 - 0.07 K/uL    Comment: Performed at [facility], [address], [place], [place] [address]  Basic metabolic panel     Status: Abnormal   Collection Time: 09/13/23  9:09 AM  Result Value Ref Range   Sodium 141 135 - 145 mmol/L   Potassium 4.3 3.5 - 5.1 mmol/L   Chloride 110 98 - 111 mmol/L   CO2 21 (L) 22 - 32 mmol/L   Glucose, Bld 87 70 - 99 mg/dL    Comment: Glucose reference range applies only to samples taken after fasting for at least 8 hours.   BUN 12 8 - 23 mg/dL   Creatinine, Ser 9.11 0.44 - 1.00  mg/dL   Calcium  9.4 8.9 - 10.3 mg/dL   GFR, Estimated >39 >39 mL/min    Comment: (NOTE) Calculated using the CKD-EPI Creatinine Equation (2021)    Anion gap 10 5 - 15    Comment: Performed at [facility], [address] [place], [place] [address]  Troponin I (High Sensitivity)     Status: None   Collection Time: 09/13/23  9:09 AM  Result Value Ref Range   Troponin I (High Sensitivity) 3 <18 ng/L    Comment: (NOTE) Elevated high sensitivity troponin I (hsTnI) values and significant  changes across serial measurements may suggest ACS but many other  chronic and acute conditions are known to elevate hsTnI results.  Refer to the Links section for chest pain algorithms and additional  guidance. Performed at [facility], [address] [place], [place] [address]   ECHOCARDIOGRAM COMPLETE BUBBLE STUDY     Status: None   Collection Time: 09/14/23  8:18 AM  Result Value Ref Range   Ao pk vel 1.68 m/s   AV Area VTI 1.56 cm2   AR max vel 1.54 cm2   AV Mean grad 5.5 mmHg   AV Peak grad 11.3 mmHg   Single Plane A2C EF 61.8 %   Single Plane A4C EF 61.5 %   Calc EF 59.8 %   S' Lateral 3.10 cm   AV Area mean vel 1.55 cm2   Area-P 1/2 5.13 cm2   P 1/2 time 434 msec   MV VTI 1.84 cm2   Est EF 55 - 60%     Radiology: No results found.  No results found.  ECHOCARDIOGRAM COMPLETE BUBBLE STUDY Result Date: 09/14/2023    ECHOCARDIOGRAM REPORT   Patient Name:   Rachael Jordan Date of Exam: 09/14/2023 Medical Rec #:  [id number]        Height:       64.0 in Accession #:    [id number]       Weight:       138.0 lb Date of Birth:  07-27-1955        BSA:  1.671 m Patient Age:    68 years         BP:           124/78 mmHg Patient Gender: F                HR:           87 bpm. Exam Location:  [facility] Procedure: 2D Echo, Color Doppler, Cardiac Doppler and Saline Contrast Bubble            Study (Both Spectral and Color Flow Doppler were utilized during             procedure). Indications:     435.9/G45.9 TIA  History:         Patient has prior history of Echocardiogram examinations.                  Asthma and Stroke; Signs/Symptoms:Murmur.  Sonographer:     L. Thornton-Maynard Referring Phys:  [id number] ERIC LINDZEN Diagnosing Phys: Redell Cave MD IMPRESSIONS  1. Left ventricular ejection fraction, by estimation, is 55 to 60%. Left ventricular ejection fraction by 2D MOD biplane is 59.8 %. The left ventricle has normal function. The left ventricle has no regional wall motion abnormalities. Left ventricular diastolic parameters were normal.  2. Right ventricular systolic function is normal. The right ventricular size is normal. There is normal pulmonary artery systolic pressure.  3. The mitral valve is normal in structure. Mild mitral valve regurgitation.  4. The aortic valve is tricuspid. Aortic valve regurgitation is trivial. Aortic valve sclerosis is present, with no evidence of aortic valve stenosis.  5. The inferior vena cava is normal in size with greater than 50% respiratory variability, suggesting right atrial pressure of 3 mmHg.  6. Agitated saline contrast bubble study was negative, with no evidence of any interatrial shunt. FINDINGS  Left Ventricle: Left ventricular ejection fraction, by estimation, is 55 to 60%. Left ventricular ejection fraction by 2D MOD biplane is 59.8 %. The left ventricle has normal function. The left ventricle has no regional wall motion abnormalities. The left ventricular internal cavity size was normal in size. There is no left ventricular hypertrophy. Left ventricular diastolic parameters were normal. Right Ventricle: The right ventricular size is normal. No increase in right ventricular wall thickness. Right ventricular systolic function is normal. There is normal pulmonary artery systolic pressure. The tricuspid regurgitant velocity is 2.27 m/s, and  with an assumed right atrial pressure of 3 mmHg, the estimated right ventricular  systolic pressure is 23.6 mmHg. Left Atrium: Left atrial size was normal in size. Right Atrium: Right atrial size was normal in size. Pericardium: There is no evidence of pericardial effusion. Mitral Valve: The mitral valve is normal in structure. Mild mitral valve regurgitation. MV peak gradient, 7.3 mmHg. The mean mitral valve gradient is 4.0 mmHg. Tricuspid Valve: The tricuspid valve is normal in structure. Tricuspid valve regurgitation is mild. Aortic Valve: The aortic valve is tricuspid. Aortic valve regurgitation is trivial. Aortic regurgitation PHT measures 434 msec. Aortic valve sclerosis is present, with no evidence of aortic valve stenosis. Aortic valve mean gradient measures 5.5 mmHg. Aortic valve peak gradient measures 11.3 mmHg. Aortic valve area, by VTI measures 1.56 cm. Pulmonic Valve: The pulmonic valve was normal in structure. Pulmonic valve regurgitation is trivial. Aorta: The aortic root is normal in size and structure. Venous: The inferior vena cava is normal in size with greater than 50% respiratory variability, suggesting right atrial pressure of 3 mmHg. IAS/Shunts: No  atrial level shunt detected by color flow Doppler. Agitated saline contrast was given intravenously to evaluate for intracardiac shunting. Agitated saline contrast bubble study was negative, with no evidence of any interatrial shunt.  LEFT VENTRICLE PLAX 2D                        Biplane EF (MOD) LVIDd:         4.70 cm         LV Biplane EF:   Left LVIDs:         3.10 cm                          ventricular LV PW:         0.90 cm                          ejection LV IVS:        1.00 cm                          fraction by LVOT diam:     1.80 cm                          2D MOD LV SV:         45                               biplane is LV SV Index:   27                               59.8 %. LVOT Area:     2.54 cm                                Diastology                                LV e' medial:    5.55 cm/s LV Volumes (MOD)                LV E/e' medial:  17.1 LV vol d, MOD    63.1 ml       LV e' lateral:   13.50 cm/s A2C:                           LV E/e' lateral: 7.0 LV vol d, MOD    79.2 ml A4C: LV vol s, MOD    24.1 ml A2C: LV vol s, MOD    30.5 ml A4C: LV SV MOD A2C:   39.0 ml LV SV MOD A4C:   79.2 ml LV SV MOD BP:    42.3 ml RIGHT VENTRICLE             IVC RV Basal diam:  3.30 cm     IVC diam: 1.50 cm RV Mid diam:    2.30 cm RV S prime:     15.10 cm/s TAPSE (M-mode): 1.5 cm LEFT ATRIUM             Index  RIGHT ATRIUM           Index LA diam:        3.10 cm 1.86 cm/m   RA Area:     10.90 cm LA Vol (A2C):   59.3 ml 35.49 ml/m  RA Volume:   23.20 ml  13.88 ml/m LA Vol (A4C):   49.5 ml 29.62 ml/m LA Biplane Vol: 55.1 ml 32.97 ml/m  AORTIC VALVE                     PULMONIC VALVE AV Area (Vmax):    1.54 cm      PV Vmax:       1.07 m/s AV Area (Vmean):   1.55 cm      PV Peak grad:  4.6 mmHg AV Area (VTI):     1.56 cm AV Vmax:           168.00 cm/s AV Vmean:          107.000 cm/s AV VTI:            0.285 m AV Peak Grad:      11.3 mmHg AV Mean Grad:      5.5 mmHg LVOT Vmax:         102.00 cm/s LVOT Vmean:        65.000 cm/s LVOT VTI:          0.175 m LVOT/AV VTI ratio: 0.61 AI PHT:            434 msec  AORTA Ao Root diam: 3.10 cm Ao Asc diam:  3.70 cm MITRAL VALVE               TRICUSPID VALVE MV Area (PHT): 5.13 cm    TR Peak grad:   20.6 mmHg MV Area VTI:   1.84 cm    TR Vmax:        227.00 cm/s MV Peak grad:  7.3 mmHg MV Mean grad:  4.0 mmHg    SHUNTS MV Vmax:       1.35 m/s    Systemic VTI:  0.18 m MV Vmean:      88.1 cm/s   Systemic Diam: 1.80 cm MV Decel Time: 148 msec MV E velocity: 94.90 cm/s MV A velocity: 81.20 cm/s MV E/A ratio:  1.17 Redell Cave MD Electronically signed by Redell Cave MD Signature Date/Time: 09/14/2023/12:41:27 PM    Final    MR ANGIO HEAD WO CONTRAST Result Date: 09/14/2023 CLINICAL DATA:  Transient ischemic attack (TIA) EXAM: MRA HEAD WITHOUT CONTRAST TECHNIQUE: Angiographic  images of the Circle of Willis were acquired using MRA technique without intravenous contrast. COMPARISON:  MRI head and CT head September 12, 2023. FINDINGS: Anterior circulation: Bilateral intracranial ICAs, MCAs, and ACAs are patent without proximal hemodynamically significant stenosis. No aneurysm identified. Posterior circulation: Bilateral intradural vertebral arteries, basilar artery, and bilateral posterior cerebral arteries are patent without proximal hemodynamically significant stenosis. No aneurysm identified. IMPRESSION: No large vessel occlusion or proximal hemodynamically significant stenosis. Electronically Signed   By: Gilmore GORMAN Molt M.D.   On: 09/14/2023 01:25   US  Carotid Bilateral Result Date: 09/13/2023 CLINICAL DATA:  [id number] TIA (transient ischemic attack) [address] EXAM: BILATERAL CAROTID DUPLEX ULTRASOUND TECHNIQUE: Elnor scale imaging, color Doppler and duplex ultrasound were performed of bilateral carotid and vertebral arteries in the neck. COMPARISON:  02/18/2019 FINDINGS: Criteria: Quantification of carotid stenosis is based on velocity parameters that correlate the residual internal carotid diameter with NASCET-based stenosis levels, using  the diameter of the distal internal carotid lumen as the denominator for stenosis measurement. The following velocity measurements were obtained: RIGHT ICA: 73/21 cm/sec CCA: 75/21 cm/sec SYSTOLIC ICA/CCA RATIO:  1.0 ECA: 76 cm/sec LEFT ICA: 93/31 cm/sec CCA: 62/15 cm/sec SYSTOLIC ICA/CCA RATIO:  1.5 ECA: 64 cm/sec RIGHT CAROTID ARTERY: Mild eccentric noncalcified plaque in the carotid bulb and proximal ICA. No significant stenosis. Normal waveforms and color Doppler signal throughout. RIGHT VERTEBRAL ARTERY:  Normal flow direction and waveform. LEFT CAROTID ARTERY: No plaque or stenosis. Normal waveforms and color Doppler signal. LEFT VERTEBRAL ARTERY:  Normal flow direction and waveform. IMPRESSION: 1. Mild right carotid bulb and proximal ICA plaque  without significant stenosis. 2. No left carotid plaque or stenosis. 3. Antegrade bilateral vertebral arterial flow. Electronically Signed   By: JONETTA Faes M.D.   On: 09/13/2023 20:00   MR BRAIN WO CONTRAST Result Date: 09/12/2023 CLINICAL DATA:  Transient ischemic attack (TIA) EXAM: MRI HEAD WITHOUT CONTRAST TECHNIQUE: Multiplanar, multiecho pulse sequences of the brain and surrounding structures were obtained without intravenous contrast. COMPARISON:  CT head from earlier today. FINDINGS: Brain: No acute infarction, hemorrhage, hydrocephalus, extra-axial collection or mass lesion. Mild T2/FLAIR hyperintensities in the white matter are nonspecific, but compatible with chronic microvascular disease. Vascular: Major arterial flow voids are maintained at the skull base. Skull and upper cervical spine: Normal marrow signal. Sinuses/Orbits: Clear sinuses.  No acute orbital findings. Other: No mastoid effusions. IMPRESSION: No evidence of acute intracranial abnormality. Electronically Signed   By: Gilmore GORMAN Molt M.D.   On: 09/12/2023 22:51   CT Head Wo Contrast Result Date: 09/12/2023 CLINICAL DATA:  Neuro deficit, concern for stroke, syncopal episode last night. Intermittent left facial numbness. EXAM: CT HEAD WITHOUT CONTRAST TECHNIQUE: Contiguous axial images were obtained from the base of the skull through the vertex without intravenous contrast. RADIATION DOSE REDUCTION: This exam was performed according to the departmental dose-optimization program which includes automated exposure control, adjustment of the mA and/or kV according to patient size and/or use of iterative reconstruction technique. COMPARISON:  CT head 06/04/2019. FINDINGS: Brain: No acute intracranial hemorrhage. No CT evidence of acute infarct. No edema, mass effect, or midline shift. The basilar cisterns are patent. Ventricles: The ventricles are normal. Vascular: No hyperdense vessel or unexpected calcification. Skull: No acute or  aggressive finding. Orbits: Orbits are symmetric. Sinuses: The visualized paranasal sinuses are clear. Other: Mastoid air cells are clear. IMPRESSION: No CT evidence of acute intracranial abnormality. Electronically Signed   By: Donnice Mania M.D.   On: 09/12/2023 19:12    Assessment and Plan: Patient Active Problem List   Diagnosis Date Noted   TIA (transient ischemic attack) 09/12/2023   Peripheral neuropathy 09/12/2023   GERD without esophagitis 09/12/2023   Chronic asthma 09/12/2023   Hypokalemia 09/12/2023   Tibial plateau fracture, left, closed, initial encounter 04/09/2022   Dyslipidemia 04/09/2022   Urinary incontinence 04/09/2022   Bilateral closed proximal tibial fracture 04/08/2022   OSA on CPAP 04/08/2022   Asthma 04/08/2022   Neuropathic pain of right lower extremity 04/16/2021   Ischemic colitis [facility])    Rectal bleeding    Left lower quadrant abdominal pain    Lower GI bleed    Hypotension    Acute colitis 12/30/2019   Lumbar herniated disc 06/16/2019   Acute nonintractable headache    Multiple falls 02/20/2019   Myoclonic jerking 02/20/2019   COPD (chronic obstructive pulmonary disease) (HCC) 02/20/2019   Seasonal allergies 02/20/2019   Insomnia 02/20/2019  Depression with anxiety 02/20/2019   AKI (acute kidney injury) (HCC) 02/20/2019   Dizziness 02/18/2019   Encounter for screening colonoscopy    Bunion 05/06/2018   Metatarsalgia of right foot 05/06/2018   Age-related osteoporosis without current pathological fracture 03/31/2018   False positive ana 03/31/2018   Mouth dryness 02/18/2018   Screening for osteoporosis 02/18/2018   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/2018   Essential hypertension 01/06/2018   Lacunar infarction (HCC) 01/06/2018   Compression fracture of thoracic vertebra, sequela 07/12/2017   Lumbar compression fracture, sequela 07/12/2017   Nocturnal hypoxia 07/12/2017   Simple chronic bronchitis (HCC) 07/12/2017   Intractable  back pain 06/05/2017   Overweight (BMI 25.0-29.9) 04/08/2017   Chronic pain syndrome 04/06/2017   Sensory disturbance 01/08/2017   Pain in the groin, right 10/28/2016   Paresthesia of both hands 12/14/2015   B12 deficiency 10/31/2015   SOB (shortness of breath) 08/29/2015   History of hematuria 07/12/2015   Urinary frequency 07/12/2015   Vaginal atrophy 07/12/2015   HAV (hallux abducto valgus) 07/02/2015   Overlapping toe 07/02/2015   Hammertoe 07/02/2015   Low back pain 01/17/2015   Trochanteric bursitis of right hip 01/17/2015   PNA (pneumonia) 12/28/2014   DDD (degenerative disc disease), lumbar 08/15/2014   Lumbar radiculopathy 08/15/2014   Spinal stenosis, lumbar region, 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/15/2014   DDD (degenerative disc disease), cervical 08/15/2014   Bilateral occipital neuralgia 08/15/2014   Migraine 08/15/2014   Sacroiliac joint dysfunction 08/15/2014   Back pain, chronic 09/23/2013   Chronic cervical pain 09/23/2013   Arthralgia of temporomandibular joint 08/29/2013   Absolute anemia 08/15/2013   Chronic kidney disease (CKD), stage III (moderate) (HCC) 08/15/2013   Abnormal serum level of alkaline phosphatase 08/15/2013   Blush 08/15/2013   Hematuria, microscopic 08/15/2013   Inflamed nasal mucosa 08/15/2013   Big thyroid  08/15/2013   Disease of thyroid  gland 08/15/2013   Positive H. pylori test 08/15/2013   Gastrointestinal ulcer due to Helicobacter pylori 08/14/2013   Other specified bacterial intestinal infections 08/14/2013   Acute kidney injury superimposed on CKD (HCC) 08/09/2013   SIRS (systemic inflammatory response syndrome) (HCC) 08/09/2013   Systemic inflammatory response syndrome (SIRS) (HCC) 08/09/2013    1. Mild intermittent asthma without complication (Primary) This is under good control she has not had any exacerbations.  Will continue with current management and supportive care.  2. Obstructive sleep apnea Her sleep apnea has not  really been a major issue she states that she has lost weight I recommended getting a follow-up sleep study done so we will go ahead and order this today. - [facility] Sleep Study; Future  3. Obesity, morbid (HCC) She does need to work a little bit on diet and exercise use, try to be more active and continue to lose weight she has done well actually over the course since her last sleep evaluation  General Counseling: I have discussed the findings of the evaluation and examination with Rachael Jordan.  I have also discussed any further diagnostic evaluation thatmay be needed or ordered today. Rachael Jordan verbalizes understanding of the findings of todays visit. We also reviewed her medications today and discussed drug interactions and side effects including but not limited excessive drowsiness and altered mental states. We also discussed that there is always a risk not just to her but also people around her. she has been encouraged to call the office with any questions or concerns that should arise related to todays visit.  No orders of the defined types  were placed in this encounter.    Time spent: 32  I have personally obtained a history, examined the patient, evaluated laboratory and imaging results, formulated the assessment and plan and placed orders.    Elfreda DELENA Bathe, MD [facility] Sleep medicine

## 2023-10-06 NOTE — Telephone Encounter (Signed)
 Call to [facility] and spoke with RN regarding recently re-faxed cardiac clearance. She states she sent message to Dr. Zachary with high priority.

## 2023-10-06 NOTE — Telephone Encounter (Signed)
 Call to [facility] and spoke with RN that I faxed over cardiac clearance form to stop ASA 325mg  for 7 days and bridge with ASA 81mg  for 7 days. She sent request over to Dr. Zachary and states will respond shortly with high priority.

## 2023-10-07 LAB — PULMONARY FUNCTION TEST

## 2023-10-08 ENCOUNTER — Telehealth: Payer: Self-pay | Admitting: *Deleted

## 2023-10-08 ENCOUNTER — Telehealth: Payer: Self-pay | Admitting: Internal Medicine

## 2023-10-08 NOTE — Telephone Encounter (Signed)
 We have received clearance to replace ASA 325 mg with ASA 81 mg for 7 days for procedure.  I have already spoken with patient and I nstructed her to do this. Please call her to schedule.

## 2023-10-08 NOTE — Telephone Encounter (Signed)
 SS order emailed to Henrietta w/ [facility]

## 2023-10-27 ENCOUNTER — Ambulatory Visit
Admission: RE | Admit: 2023-10-27 | Discharge: 2023-10-27 | Disposition: A | Discharge status: Home / Self Care | Source: Ambulatory Visit | Attending: Student in an Organized Health Care Education/Training Program | Admitting: Student in an Organized Health Care Education/Training Program

## 2023-10-27 ENCOUNTER — Ambulatory Visit (HOSPITAL_BASED_OUTPATIENT_CLINIC_OR_DEPARTMENT_OTHER): Admitting: Student in an Organized Health Care Education/Training Program

## 2023-10-27 ENCOUNTER — Encounter: Payer: Self-pay | Admitting: Student in an Organized Health Care Education/Training Program

## 2023-10-27 VITALS — BP 130/78 | HR 101 | Temp 97.5°F | Resp 18 | Ht 65.0 in | Wt 156.0 lb

## 2023-10-27 DIAGNOSIS — G8929 Other chronic pain: Secondary | ICD-10-CM | POA: Insufficient documentation

## 2023-10-27 DIAGNOSIS — M5416 Radiculopathy, lumbar region: Secondary | ICD-10-CM | POA: Diagnosis present

## 2023-10-27 MED ORDER — IOHEXOL 180 MG/ML  SOLN
INTRAMUSCULAR | Status: AC
  Filled 2023-10-27: qty 10

## 2023-10-27 MED ORDER — MIDAZOLAM HCL 5 MG/5ML IJ SOLN
INTRAMUSCULAR | Status: AC
  Filled 2023-10-27: qty 5

## 2023-10-27 MED ORDER — FENTANYL CITRATE (PF) 100 MCG/2ML IJ SOLN
INTRAMUSCULAR | Status: AC
  Filled 2023-10-27: qty 2

## 2023-10-27 MED ORDER — SODIUM CHLORIDE (PF) 0.9 % IJ SOLN
INTRAMUSCULAR | Status: AC
  Filled 2023-10-27: qty 10

## 2023-10-27 MED ORDER — LIDOCAINE HCL 2 % IJ SOLN
20.0000 mL | Freq: Once | INTRAMUSCULAR | Status: AC
  Administered 2023-10-27 (×2): 100 mg

## 2023-10-27 MED ORDER — ROPIVACAINE HCL 2 MG/ML IJ SOLN
INTRAMUSCULAR | Status: AC
  Filled 2023-10-27: qty 20

## 2023-10-27 MED ORDER — SODIUM CHLORIDE 0.9% FLUSH
2.0000 mL | Freq: Once | INTRAVENOUS | Status: AC
  Administered 2023-10-27 (×2): 2 mL

## 2023-10-27 MED ORDER — FENTANYL CITRATE (PF) 100 MCG/2ML IJ SOLN
25.0000 ug | INTRAMUSCULAR | Status: DC | PRN
  Administered 2023-10-27 (×2): 50 ug via INTRAVENOUS

## 2023-10-27 MED ORDER — ROPIVACAINE HCL 2 MG/ML IJ SOLN
2.0000 mL | Freq: Once | INTRAMUSCULAR | Status: AC
  Administered 2023-10-27 (×2): 2 mL via EPIDURAL

## 2023-10-27 MED ORDER — IOHEXOL 180 MG/ML  SOLN
10.0000 mL | Freq: Once | INTRAMUSCULAR | Status: AC
  Administered 2023-10-27 (×2): 10 mL via EPIDURAL

## 2023-10-27 MED ORDER — MIDAZOLAM HCL 5 MG/5ML IJ SOLN
0.5000 mg | Freq: Once | INTRAMUSCULAR | Status: AC
  Administered 2023-10-27 (×2): 2 mg via INTRAVENOUS

## 2023-10-27 MED ORDER — DEXAMETHASONE SODIUM PHOSPHATE 10 MG/ML IJ SOLN
INTRAMUSCULAR | Status: AC
  Filled 2023-10-27: qty 2

## 2023-10-27 MED ORDER — LIDOCAINE HCL (PF) 2 % IJ SOLN
INTRAMUSCULAR | Status: AC
Start: 2023-10-27 — End: 2023-10-27
  Filled 2023-10-27: qty 10

## 2023-10-27 MED ORDER — DEXAMETHASONE SODIUM PHOSPHATE 10 MG/ML IJ SOLN
20.0000 mg | Freq: Once | INTRAMUSCULAR | Status: AC
  Administered 2023-10-27 (×2): 20 mg

## 2023-10-27 NOTE — Progress Notes (Signed)
 PROVIDER NOTE: Interpretation of information contained herein should be left to medically-trained personnel. Specific patient instructions are provided elsewhere under Patient Instructions section of medical record. This document was created in part using STT-dictation technology, any transcriptional errors that may result from this process are unintentional.  Patient: Rachael Jordan Type: Established DOB: [birth year] MRN: [id number] PCP: Zachary Idelia LABOR, MD  Service: Procedure DOS: 10/27/2023 Setting: Ambulatory Location: Ambulatory outpatient facility Delivery: Face-to-face Provider: Wallie Sherry, MD Specialty: Interventional Pain Management Specialty designation: 09 Location: Outpatient facility Ref. Prov.: George, Sionne A, MD       Interventional Therapy   Procedure: Lumbar trans-foraminal epidural steroid injection (L-TFESI) #1  Laterality: Right (-RT)  Level: L5 & S1 nerve root(s) Imaging: Fluoroscopy-guided         Anesthesia: Local anesthesia (1-2% Lidocaine ) Sedation: Moderate Sedation                       DOS: 10/27/2023  Performed by: Wallie Sherry, MD  Purpose: Diagnostic/Therapeutic Indications: Lumbar radicular pain severe enough to impact quality of life or function. 1. Chronic radicular lumbar pain   2. Lumbar radiculopathy    NAS-11 Pain score:   Pre-procedure: 8 /10   Post-procedure: 0-No pain/10     Position / Prep / Materials:  Position: Prone  Prep solution: ChloraPrep (2% chlorhexidine  gluconate and 70% isopropyl alcohol) Prep Area: Entire Posterior Lumbosacral Area.  From the lower tip of the scapula down to the tailbone and from flank to flank. Materials:  Tray: Block Needle(s):  Type: Spinal  Gauge (G): 22  Length: 3.5-in  Qty: 2     H&P (Pre-op Assessment):  Rachael Jordan is a 68 y.o. (year old), female patient, seen today for interventional treatment. She  has a past surgical history that includes Appendectomy; Abdominal hysterectomy; Neck  surgery; Foot surgery (Left); Colonoscopy with propofol  (N/A, 12/29/2018); Esophagogastroduodenoscopy (N/A, 01/02/2020); Colonoscopy (N/A, 01/02/2020); Breast biopsy; ORIF tibia plateau (Left, 04/10/2022); and Coronary artery bypass graft. Rachael Jordan has a current medication list which includes the following prescription(s): acetaminophen , albuterol , albuterol , amitriptyline , amlodipine , aspirin  ec, atorvastatin , azelastine , biotin , budesonide -formoterol , cetirizine , cholecalciferol , b-12, diclofenac  sodium, gabapentin , lidocaine , montelukast , multiple vitamins-minerals, naloxone , olopatadine  hcl, oxycodone , polyethylene glycol, promethazine , pyridoxine , rizatriptan , tizanidine , trazodone , valacyclovir , ascorbic acid , and vitamin e , and the following Facility-Administered Medications: fentanyl . Her primarily concern today is the Back Pain  Initial Vital Signs:  Pulse/HCG Rate: (!) 101ECG Heart Rate: 89 (nsr) Temp: 98.1 F (36.7 C) Resp: 18 BP: 128/89 SpO2: 100 %  BMI: Estimated body mass index is 25.96 kg/m as calculated from the following:   Height as of this encounter: 5' 5 (1.651 m).   Weight as of this encounter: 156 lb (70.8 kg).  Risk Assessment: Allergies: Reviewed. She is allergic to grapefruit extract, morphine  and codeine, oxycodone , oxymorphone, and propoxyphene.  Allergy Precautions: None required Coagulopathies: Reviewed. None identified.  Blood-thinner therapy: None at this time Active Infection(s): Reviewed. None identified. Rachael Jordan is afebrile  Site Confirmation: Rachael Jordan was asked to confirm the procedure and laterality before marking the site Procedure checklist: Completed Consent: Before the procedure and under the influence of no sedative(s), amnesic(s), or anxiolytics, the patient was informed of the treatment options, risks and possible complications. To fulfill our ethical and legal obligations, as recommended by the American Medical Association's Code of Ethics,  I have informed the patient of my clinical impression; the nature and purpose of the treatment or procedure; the risks, benefits, and possible complications of the intervention;  the alternatives, including doing nothing; the risk(s) and benefit(s) of the alternative treatment(s) or procedure(s); and the risk(s) and benefit(s) of doing nothing. The patient was provided information about the general risks and possible complications associated with the procedure. These may include, but are not limited to: failure to achieve desired goals, infection, bleeding, organ or nerve damage, allergic reactions, paralysis, and death. In addition, the patient was informed of those risks and complications associated to Spine-related procedures, such as failure to decrease pain; infection (i.e.: Meningitis, epidural or intraspinal abscess); bleeding (i.e.: epidural hematoma, subarachnoid hemorrhage, or any other type of intraspinal or peri-dural bleeding); organ or nerve damage (i.e.: Any type of peripheral nerve, nerve root, or spinal cord injury) with subsequent damage to sensory, motor, and/or autonomic systems, resulting in permanent pain, numbness, and/or weakness of one or several areas of the body; allergic reactions; (i.e.: anaphylactic reaction); and/or death. Furthermore, the patient was informed of those risks and complications associated with the medications. These include, but are not limited to: allergic reactions (i.e.: anaphylactic or anaphylactoid reaction(s)); adrenal axis suppression; blood sugar elevation that in diabetics may result in ketoacidosis or comma; water retention that in patients with history of congestive heart failure may result in shortness of breath, pulmonary edema, and decompensation with resultant heart failure; weight gain; swelling or edema; medication-induced neural toxicity; particulate matter embolism and blood vessel occlusion with resultant organ, and/or nervous system infarction;  and/or aseptic necrosis of one or more joints. Finally, the patient was informed that Medicine is not an exact science; therefore, there is also the possibility of unforeseen or unpredictable risks and/or possible complications that may result in a catastrophic outcome. The patient indicated having understood very clearly. We have given the patient no guarantees and we have made no promises. Enough time was given to the patient to ask questions, all of which were answered to the patient's satisfaction. Ms. Couvillon has indicated that she wanted to continue with the procedure. Attestation: I, the ordering provider, attest that I have discussed with the patient the benefits, risks, side-effects, alternatives, likelihood of achieving goals, and potential problems during recovery for the procedure that I have provided informed consent. Date  Time: 10/27/2023  8:17 AM  Pre-Procedure Preparation:  Monitoring: As per clinic protocol. Respiration, ETCO2, SpO2, BP, heart rate and rhythm monitor placed and checked for adequate function Safety Precautions: Patient was assessed for positional comfort and pressure points before starting the procedure. Time-out: I initiated and conducted the Time-out before starting the procedure, as per protocol. The patient was asked to participate by confirming the accuracy of the Time Out information. Verification of the correct person, site, and procedure were performed and confirmed by me, the nursing staff, and the patient. Time-out conducted as per Joint Commission's Universal Protocol (UP.01.01.01). Time: 0908 Start Time: 0908 hrs.  Description/Narrative of Procedure:          Target: The 6 o'clock position under the pedicle, on the affected side. Region: Posterolateral Lumbosacral Approach: Posterior Percutaneous Paravertebral approach.  Rationale (medical necessity): procedure needed and proper for the diagnosis and/or treatment of the patient's medical symptoms  and needs. Procedural Technique Safety Precautions: Aspiration looking for blood return was conducted prior to all injections. At no point did we inject any substances, as a needle was being advanced. No attempts were made at seeking any paresthesias. Safe injection practices and needle disposal techniques used. Medications properly checked for expiration dates. SDV (single dose vial) medications used. Description of the Procedure: Protocol guidelines were  followed. The patient was placed in position over the procedure table. The target area was identified and the area prepped in the usual manner. Skin & deeper tissues infiltrated with local anesthetic. Appropriate amount of time allowed to pass for local anesthetics to take effect. The procedure needles were then advanced to the target area. Proper needle placement secured. Negative aspiration confirmed. Solution injected in intermittent fashion, asking for systemic symptoms every 0.5cc of injectate. The needles were then removed and the area cleansed, making sure to leave some of the prepping solution back to take advantage of its long term bactericidal properties.  Vitals:   10/27/23 0911 10/27/23 0913 10/27/23 0922 10/27/23 0933  BP: (!) 169/107 (!) 161/104 138/82 124/88  Pulse:      Resp: 17 18 10 10   Temp:      SpO2: 100% 100% 94% 95%  Weight:      Height:        Start Time: 0908 hrs. End Time: 0913 hrs.  Imaging Guidance (Spinal):          Type of Imaging Technique: Fluoroscopy Guidance (Spinal) Indication(s): Fluoroscopy guidance for needle placement to enhance accuracy in procedures requiring precise needle localization for targeted delivery of medication in or near specific anatomical locations not easily accessible without such real-time imaging assistance. Exposure Time: Please see nurses notes. Contrast: Before injecting any contrast, we confirmed that the patient did not have an allergy to iodine , shellfish, or radiological  contrast. Once satisfactory needle placement was completed at the desired level, radiological contrast was injected. Contrast injected under live fluoroscopy. No contrast complications. See chart for type and volume of contrast used. Fluoroscopic Guidance: I was personally present during the use of fluoroscopy. Tunnel Vision Technique used to obtain the best possible view of the target area. Parallax error corrected before commencing the procedure. Direction-depth-direction technique used to introduce the needle under continuous pulsed fluoroscopy. Once target was reached, antero-posterior, oblique, and lateral fluoroscopic projection used confirm needle placement in all planes. Images permanently stored in EMR. Interpretation: I personally interpreted the imaging intraoperatively. Adequate needle placement confirmed in multiple planes. Appropriate spread of contrast into desired area was observed. No evidence of afferent or efferent intravascular uptake. No intrathecal or subarachnoid spread observed. Permanent images saved into the patient's record.  Post-operative Assessment:  Post-procedure Vital Signs:  Pulse/HCG Rate: (!) 10197 Temp: 98.1 F (36.7 C) Resp: 10 BP: 124/88 SpO2: 95 %  EBL: None  Complications: No immediate post-treatment complications observed by team, or reported by patient.  Note: The patient tolerated the entire procedure well. A repeat set of vitals were taken after the procedure and the patient was kept under observation following institutional policy, for this type of procedure. Post-procedural neurological assessment was performed, showing return to baseline, prior to discharge. The patient was provided with post-procedure discharge instructions, including a section on how to identify potential problems. Should any problems arise concerning this procedure, the patient was given instructions to immediately contact us , at any time, without hesitation. In any case, we  plan to contact the patient by telephone for a follow-up status report regarding this interventional procedure.  Comments:  No additional relevant information.  Plan of Care (POC)  Orders:  Orders Placed This Encounter  Procedures   DG PAIN [facility] C-ARM 1-60 MIN NO REPORT    Intraoperative interpretation by procedural physician at [facility].    Standing Status:   Standing    Number of Occurrences:   1  Reason for exam::   Assistance in needle guidance and placement for procedures requiring needle placement in or near specific anatomical locations not easily accessible without such assistance.     Medications ordered for procedure: Meds ordered this encounter  Medications   iohexol  (OMNIPAQUE ) 180 MG/ML injection 10 mL    Must be Myelogram-compatible. If not available, you may substitute with a water-soluble, non-ionic, hypoallergenic, myelogram-compatible radiological contrast medium.   lidocaine  (XYLOCAINE ) 2 % (with pres) injection 400 mg   midazolam  (VERSED ) 5 MG/5ML injection 0.5-2 mg    Make sure Flumazenil is available in the pyxis when using this medication. If oversedation occurs, administer 0.2 mg IV over 15 sec. If after 45 sec no response, administer 0.2 mg again over 1 min; may repeat at 1 min intervals; not to exceed 4 doses (1 mg)   fentaNYL  (SUBLIMAZE ) injection 25-50 mcg    Make sure Narcan  is available in the pyxis when using this medication. In the event of respiratory depression (RR< 8/min): Titrate NARCAN  (naloxone ) in increments of 0.1 to 0.2 mg IV at 2-3 minute intervals, until desired degree of reversal.   dexamethasone  (DECADRON ) injection 20 mg    This is for a two (2) level block. Use two (2) syringes and divide content in half.   ropivacaine  (PF) 2 mg/mL (0.2%) (NAROPIN ) injection 2 mL    This is for a two (2) level block. Use two (2) syringes and divide content in half.   sodium chloride  flush (NS) 0.9 % injection 2 mL    This is for a two  (2) level block. Use two (2) syringes and divide content in half.   Medications administered: We administered iohexol , lidocaine , midazolam , fentaNYL , dexamethasone , ropivacaine  (PF) 2 mg/mL (0.2%), and sodium chloride  flush.  See the medical record for exact dosing, route, and time of administration.   Follow-up plan:   Return in about 4 weeks (around 11/24/2023) for PPE, VV Seema.     Recent Visits Date Type Provider Dept  09/04/23 Office Visit Marcelino Nurse, MD [facility]  Showing recent visits within past 90 days and meeting all other requirements Today's Visits Date Type Provider Dept  10/27/23 Procedure visit Marcelino Nurse, MD [facility]  Showing today's visits and meeting all other requirements Future Appointments Date Type Provider Dept  11/25/23 Appointment Patel, Seema K, NP [facility]  Showing future appointments within next 90 days and meeting all other requirements   Disposition: Discharge home  Discharge (Date  Time): 10/27/2023; 0945 hrs.   Primary Care Physician: Zachary Idelia LABOR, MD Location: [facility] Note by: Nurse Marcelino, MD (TTS technology used. I apologize for any typographical errors that were not detected and corrected.) Date: 10/27/2023; Time: 9:40 AM  Disclaimer:  Medicine is not an Visual merchandiser. The only guarantee in medicine is that nothing is guaranteed. It is important to note that the decision to proceed with this intervention was based on the information collected from the patient. The Data and conclusions were drawn from the patient's questionnaire, the interview, and the physical examination. Because the information was provided in large part by the patient, it cannot be guaranteed that it has not been purposely or unconsciously manipulated. Every effort has been made to obtain as much relevant data as possible for this evaluation. It is important to note that the conclusions that lead to  this procedure are derived in large part from the available data. Always take into account that the treatment  will also be dependent on availability of resources and existing treatment guidelines, considered by other Pain Management Practitioners as being common knowledge and practice, at the time of the intervention. For Medico-Legal purposes, it is also important to point out that variation in procedural techniques and pharmacological choices are the acceptable norm. The indications, contraindications, technique, and results of the above procedure should only be interpreted and judged by a Board-Certified Interventional Pain Specialist with extensive familiarity and expertise in the same exact procedure and technique.

## 2023-10-27 NOTE — Progress Notes (Signed)
 Safety precautions to be maintained throughout the outpatient stay will include: orient to surroundings, keep bed in low position, maintain call bell within reach at all times, provide assistance with transfer out of bed and ambulation.

## 2023-10-27 NOTE — Patient Instructions (Signed)
 Post-Procedure Discharge Instructions  Instructions: Apply ice:  Purpose: This will minimize any swelling and discomfort after procedure.  When: Day of procedure, as soon as you get home. How: Fill a plastic sandwich bag with crushed ice. Cover it with a small towel and apply to injection site. How long: (15 min on, 15 min off) Apply for 15 minutes then remove x 15 minutes.  Repeat sequence on day of procedure, until you go to bed. Apply heat:  Purpose: To treat any soreness and discomfort from the procedure. When: Starting the next day after the procedure. How: Apply heat to procedure site starting the day following the procedure. How long: May continue to repeat daily, until discomfort goes away. Food intake: Start with clear liquids (like water) and advance to regular food, as tolerated.  Physical activities: Keep activities to a minimum for the first 8 hours after the procedure. After that, then as tolerated. Driving: If you have received any sedation, be responsible and do not drive. You are not allowed to drive for 24 hours after having sedation. Blood thinner: (Applies only to those taking blood thinners) You may restart your blood thinner 6 hours after your procedure. Insulin: (Applies only to Diabetic patients taking insulin) As soon as you can eat, you may resume your normal dosing schedule. Infection prevention: Keep procedure site clean and dry. Shower daily and clean area with soap and water. Post-procedure Pain Diary: Extremely important that this be done correctly and accurately. Recorded information will be used to determine the next step in treatment. For the purpose of accuracy, follow these rules: Evaluate only the area treated. Do not report or include pain from an untreated area. For the purpose of this evaluation, ignore all other areas of pain, except for the treated area. After your procedure, avoid taking a long nap and attempting to complete the pain diary after you  wake up. Instead, set your alarm clock to go off every hour, on the hour, for the initial 8 hours after the procedure. Document the duration of the numbing medicine, and the relief you are getting from it. Do not go to sleep and attempt to complete it later. It will not be accurate. If you received sedation, it is likely that you were given a medication that may cause amnesia. Because of this, completing the diary at a later time may cause the information to be inaccurate. This information is needed to plan your care. Follow-up appointment: Keep your post-procedure follow-up evaluation appointment after the procedure (usually 2 weeks for most procedures, 6 weeks for radiofrequencies). DO NOT FORGET to bring you pain diary with you.   Expect: (What should I expect to see with my procedure?) From numbing medicine (AKA: Local Anesthetics): Numbness or decrease in pain. You may also experience some weakness, which if present, could last for the duration of the local anesthetic. Onset: Full effect within 15 minutes of injected. Duration: It will depend on the type of local anesthetic used. On the average, 1 to 8 hours.  From steroids (Applies only if steroids were used): Decrease in swelling or inflammation. Once inflammation is improved, relief of the pain will follow. Onset of benefits: Depends on the amount of swelling present. The more swelling, the longer it will take for the benefits to be seen. In some cases, up to 10 days. Duration: Steroids will stay in the system x 2 weeks. Duration of benefits will depend on multiple posibilities including persistent irritating factors. Side-effects: If present, they may typically  last 2 weeks (the duration of the steroids). Frequent: Cramps (if they occur, drink Gatorade and take over-the-counter Magnesium 450-500 mg once to twice a day); water retention with temporary weight gain; increases in blood sugar; decreased immune system response; increased  appetite. Occasional: Facial flushing (red, warm cheeks); mood swings; menstrual changes. Uncommon: Long-term decrease or suppression of natural hormones; bone thinning. (These are more common with higher doses or more frequent use. This is why we prefer that our patients avoid having any injection therapies in other practices.)  Very Rare: Severe mood changes; psychosis; aseptic necrosis. From procedure: Some discomfort is to be expected once the numbing medicine wears off. This should be minimal if ice and heat are applied as instructed.  Call if: (When should I call?) You experience numbness and weakness that gets worse with time, as opposed to wearing off. New onset bowel or bladder incontinence. (Applies only to procedures done in the spine)  Emergency Numbers: Durning business hours (Monday - Thursday, 8:00 AM - 4:00 PM) (Friday, 9:00 AM - 12:00 Noon): [phone] After hours: [phone] NOTE: If you are having a problem and are unable connect with, or to talk to a provider, then go to your nearest urgent care or emergency department. If the problem is serious and urgent, please call 911.  Moderate Conscious Sedation, Adult, Care After After the procedure, it is common to have: Sleepiness for a few hours. Impaired judgment for a few hours. Trouble with balance. Nausea or vomiting if you eat too soon. Follow these instructions at home: For the time period you were told by your health care provider:  Rest. Do not participate in activities where you could fall or become injured. Do not drive or use machinery. Do not drink alcohol. Do not take sleeping pills or medicines that cause drowsiness. Do not make important decisions or sign legal documents. Do not take care of children on your own. Eating and drinking Follow instructions from your health care provider about what you may eat and drink. Drink enough fluid to keep your urine pale yellow. If you vomit: Drink clear  fluids slowly and in small amounts as you are able. Clear fluids include water, ice chips, low-calorie sports drinks, and fruit juice that has water added to it (diluted fruit juice). Eat light and bland foods in small amounts as you are able. These foods include bananas, applesauce, rice, lean meats, toast, and crackers. General instructions Take over-the-counter and prescription medicines only as told by your health care provider. Have a responsible adult stay with you for the time you are told. Do not use any products that contain nicotine or tobacco. These products include cigarettes, chewing tobacco, and vaping devices, such as e-cigarettes. If you need help quitting, ask your health care provider. Return to your normal activities as told by your health care provider. Ask your health care provider what activities are safe for you. Your health care provider may give you more instructions. Make sure you know what you can and cannot do.   Contact a health care provider if: You are still sleepy or having trouble with balance after 24 hours. You feel light-headed. You vomit every time you eat or drink. You get a rash. You have a fever. You have redness or swelling around the IV site. Get help right away if: You have trouble breathing. You start to feel confused at home. These symptoms may be an emergency. Get help right away. Call 911. Do not wait to see  if the symptoms will go away. Do not drive yourself to the hospital. This information is not intended to replace advice given to you by your health care provider. Make sure you discuss any questions you have with your health care provider. Document Revised: 09/17/2021 Document Reviewed: 09/17/2021 Elsevier Patient Education  2024 [employer].

## 2023-10-28 ENCOUNTER — Telehealth: Payer: Self-pay | Admitting: *Deleted

## 2023-10-28 NOTE — Telephone Encounter (Signed)
 Post procedure call;  patient reports she is doing fine.

## 2023-11-04 ENCOUNTER — Encounter: Payer: Self-pay | Admitting: Podiatry

## 2023-11-04 ENCOUNTER — Ambulatory Visit (INDEPENDENT_AMBULATORY_CARE_PROVIDER_SITE_OTHER)

## 2023-11-04 ENCOUNTER — Ambulatory Visit (INDEPENDENT_AMBULATORY_CARE_PROVIDER_SITE_OTHER): Admitting: Podiatry

## 2023-11-04 DIAGNOSIS — M7741 Metatarsalgia, right foot: Secondary | ICD-10-CM

## 2023-11-04 DIAGNOSIS — M21619 Bunion of unspecified foot: Secondary | ICD-10-CM

## 2023-11-04 DIAGNOSIS — M778 Other enthesopathies, not elsewhere classified: Secondary | ICD-10-CM

## 2023-11-04 DIAGNOSIS — M7751 Other enthesopathy of right foot: Secondary | ICD-10-CM | POA: Diagnosis not present

## 2023-11-04 DIAGNOSIS — M2041 Other hammer toe(s) (acquired), right foot: Secondary | ICD-10-CM | POA: Diagnosis not present

## 2023-11-04 NOTE — Patient Instructions (Signed)

## 2023-11-05 NOTE — Progress Notes (Signed)
 Subjective:  [name], female    DOB: 1955/07/22,  MRN: [id number]  Chief Complaint  Patient presents with   Bunions    Right foot bunion leaning over affecting toes. Chronic issue. 6 pain. Non diabetic.     Discussed the use of AI scribe software for clinical note transcription with the patient, who gave verbal consent to proceed.  History of Present Illness Rachael Jordan is a 68 year old female with a history of bunion surgery and recent mini stroke who presents with right foot pain and deformity.  She experiences significant pain in her right foot due to a bunion, causing the first three toes to cramp and become misaligned. The second toe is displaced, leading to discomfort. Her left foot, which underwent previous bunion surgery, is currently asymptomatic, though she feels a sensation of the toes needing to adjust. She has attempted using various pads and cushions to alleviate the pain in her right foot. She recently experienced a mini stroke and is on blood thinners, preferring to remain on a lower dose of baby aspirin .      Objective:    Physical Exam General: AAO x3, NAD  Dermatological: Skin is warm, dry and supple bilateral. There are no open sores, no preulcerative lesions, no rash or signs of infection present.  Vascular: Dorsalis Pedis artery and Posterior Tibial artery pedal pulses are 2/4 bilateral with immedate capillary fill time.  There is no pain with calf compression, swelling, warmth, erythema.   Neruologic: Grossly intact via light touch bilateral.   Musculoskeletal: Moderate bunion present to right foot with her second toe overlapping the hallux and hammertoe contracture present causing discomfort.  She has tenderness on the bunion as well as second digit.  No other areas of discomfort.  Gait: Unassisted, Nonantalgic.     No images are attached to the encounter.    Results RADIOLOGY Foot X-ray: Bunion with second digit  hammer toe.  Intermetatarsal angle of 16 degrees.  No evidence of acute fracture.   Assessment:   1. Bunion   2. Hammertoe of right foot   3. Metatarsalgia, right foot      Plan:  Patient was evaluated and treated and all questions answered.  Assessment and Plan Assessment & Plan Right foot hallux valgus with second toe hammer toe and metatarsophalangeal joint contracture Significant pain and deformity due to bunion, second toe misalignment, and third toe displacement. Condition mirrors previous left foot issue, surgically corrected. Ready for surgical intervention to alleviate pain and improve function. - Schedule right foot Massie and Aiken bunionectomy with second digit hammer toe repair, second MPJ release with Weil osteotomy, and pin and screw fixation. - Obtain surgical clearance due to recent transient ischemic attack and current use of blood thinners. - Plan for postoperative care including six weeks in a boot, wire removal in office, and physical therapy. -The incision placement as well as the postoperative course was discussed with the patient. I discussed risks of the surgery which include, but not limited to, infection, bleeding, pain, swelling, need for further surgery, delayed or nonhealing, painful or ugly scar, numbness or sensation changes, over/under correction, recurrence, transfer lesions, further deformity, hardware failure, DVT/PE, loss of toe/foot. Patient understands these risks and wishes to proceed with surgery. The surgical consent was reviewed with the patient all 3 pages were signed. No promises or guarantees were given to the outcome of the procedure. All questions were answered to the best of my ability. Before the surgery the  patient was encouraged to call the office if there is any further questions. The surgery will be performed at the [facility] on an outpatient basis.     No follow-ups on file.   Rachael Jordan DPM

## 2023-11-24 ENCOUNTER — Telehealth: Payer: Self-pay | Admitting: Podiatry

## 2023-11-24 ENCOUNTER — Telehealth: Payer: Self-pay

## 2023-11-24 NOTE — Telephone Encounter (Signed)
 LM to call for pre virtual appt questions.

## 2023-11-24 NOTE — Telephone Encounter (Addendum)
 Received surgical consent  Left message for pt to call to get her surgery scheduled.  Pt returned call and I was on the other line called pt back and she is wanting to wait until her neices wedding is over in January to have the surgery.  I have her scheduled for 04/07/2024

## 2023-11-24 NOTE — Progress Notes (Unsigned)
 PROVIDER NOTE: Interpretation of information contained herein should be left to medically-trained personnel. Specific patient instructions are provided elsewhere under Patient Instructions section of medical record. This document was created in part using AI and STT-dictation technology, any transcriptional errors that may result from this process are unintentional.  Patient: Rachael Jordan  Service: E/M   PCP: Zachary Idelia LABOR, MD  DOB: 10-22-1955  DOS: 11/25/2023  Provider: Emmy MARLA Blanch, NP  MRN: [id number]  Delivery: Virtual Visit  Specialty: Interventional Pain Management  Type: Established Patient  Setting: Ambulatory outpatient facility  Specialty designation: 09  Referring Prov.: Zachary Idelia LABOR, MD  Location: Remote location       Virtual Encounter - Pain Management PROVIDER NOTE: Information contained herein reflects review and annotations entered in association with encounter. Interpretation of such information and data should be left to medically-trained personnel. Information provided to patient can be located elsewhere in the medical record under Patient Instructions. Document created using STT-dictation technology, any transcriptional errors that may result from process are unintentional.    Contact & Pharmacy Preferred: [phone] Home: [phone] (home) Mobile: [phone] (mobile) E-mail: [email or web address]  [facility] - [place], [place] - [address] [place] [address] Phone: [phone] Fax: [phone]   Pre-screening  Rachael Jordan offered in-person vs virtual encounter. She indicated preferring virtual for this encounter.   Reason COVID-19*  Social distancing based on CDC and AMA recommendations.   I contacted Rachael Jordan on 11/25/2023 via telephone.      I clearly identified myself as Emmy MARLA Blanch, NP. I verified that I was speaking with the correct person using two identifiers (Name: Armonee Bojanowski, and date of birth:  [birth year]).  Consent I sought verbal advanced consent from Rachael Jordan for virtual visit interactions. I informed Rachael Jordan of possible security and privacy concerns, risks, and limitations associated with providing not-in-person medical evaluation and management services. I also informed Rachael Jordan of the availability of in-person appointments. Finally, I informed her that there would be a charge for the virtual visit and that she could be  personally, fully or partially, financially responsible for it. Rachael Jordan expressed understanding and agreed to proceed.   Historic Elements   Rachael Jordan is a 68 y.o. year old, female patient evaluated today after our last contact on Visit date not found. Rachael Jordan  has a past medical history of Acid reflux, Anxiety, Arthritis, Asthma, Bronchitis (09/2015), CVA (cerebral vascular accident) (HCC), Gross hematuria, H. 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 She also  has a past surgical history that includes Appendectomy; Abdominal hysterectomy; Neck surgery; Foot surgery (Left); Colonoscopy with propofol  (N/A, 12/29/2018); Esophagogastroduodenoscopy (N/A, 01/02/2020); Colonoscopy (N/A, 01/02/2020); Breast biopsy; ORIF tibia plateau (Left, 04/10/2022); and Coronary artery bypass graft. Rachael Jordan has a current medication list which includes the following prescription(s): acetaminophen , albuterol , albuterol , amitriptyline , amlodipine , aspirin  ec, atorvastatin , azelastine , biotin , budesonide -formoterol , cetirizine , cholecalciferol , b-12, diclofenac  sodium, gabapentin , lidocaine , montelukast , multiple vitamins-minerals, naloxone , olopatadine  hcl, oxycodone , polyethylene glycol, promethazine , pyridoxine , rizatriptan , tizanidine , trazodone , valacyclovir , ascorbic  acid, and vitamin e . She  reports that she has never smoked. She has never used smokeless tobacco. She reports that she does not drink alcohol and does not use drugs. Rachael Jordan is allergic to grapefruit extract, morphine  and codeine, oxycodone , oxymorphone, and propoxyphene.  BMI: Estimated body mass index is 25.96 kg/m as calculated  from the following:   Height as of 10/27/23: 5' 5 (1.651 m).   Weight as of 10/27/23: 156 lb (70.8 kg). Last encounter: Visit date not found. Last procedure: Visit date not found.  HPI  Today, she is being contacted for a post-procedure assessment.   Procedure Procedure: Lumbar trans-foraminal epidural steroid injection (L-TFESI) #1  Laterality: Right (-RT)  Level: L5 & S1 nerve root(s) Imaging: Fluoroscopy-guided         Anesthesia: Local anesthesia (1-2% Lidocaine ) Sedation: Moderate Sedation                       DOS: 10/27/2023  Performed by: Rachael Sherry, MD   Purpose: Diagnostic/Therapeutic Indications: Lumbar radicular pain severe enough to impact quality of life or function. 1. Chronic radicular lumbar pain   2. Lumbar radiculopathy     NAS-11 Pain score:        Pre-procedure: 8 /10        Post-procedure: 0-No pain/10   Post-Procedure Evaluation    Effectiveness:  Initial hour after procedure:   ***. Subsequent 4-6 hours post-procedure:   ***. Analgesia past initial 6 hours:   ***. Ongoing improvement:  Analgesic:  *** Function: {Blank single:19197::No benefit,No improvement,Back to baseline,Transient improvement,Rachael Jordan reports improvement in function,Somewhat improved,Minimal improvement,   ***   } ROM: {Blank single:19197::No benefit,No improvement,Back to baseline,Transient improvement,Rachael Jordan reports improvement in ROM,Somewhat improved,Minimal improvement,   ***   }   Pharmacotherapy Assessment  Monitoring: [place] PMP: PDMP not reviewed this encounter.       Pharmacotherapy: No side-effects or  adverse reactions reported. Compliance: No problems identified. Effectiveness: Clinically acceptable. Plan: Refer to POC.  UDS:  Summary  Date Value Ref Range Status  06/21/2021 Note  Final    Comment:    ==================================================================== [employer] Select 13 (MW) ==================================================================== Test                             Result       Flag       Units  Drug Present and Declared for Prescription Verification   Hydrocodone                     1931         EXPECTED   ng/mg creat   Hydromorphone                   344          EXPECTED   ng/mg creat   Dihydrocodeine                 752          EXPECTED   ng/mg creat   Norhydrocodone                 >2674        EXPECTED   ng/mg creat    Sources of hydrocodone  include scheduled prescription medications.    Hydromorphone , dihydrocodeine and norhydrocodone are expected    metabolites of hydrocodone . Hydromorphone  and dihydrocodeine are    also available as scheduled prescription medications.  Drug Present not Declared for Prescription Verification   Alpha-hydroxymidazolam         237          UNEXPECTED ng/mg creat    Alpha-hydroxymidazolam is an expected metabolite of midazolam .    Source of midazolam  is a scheduled prescription medication.  Drug Absent but  Declared for Prescription Verification   Oxymorphone                    Not Detected UNEXPECTED ng/mg creat ==================================================================== Test                      Result    Flag   Units      Ref Range   Creatinine              187              mg/dL      >=79 ==================================================================== Declared Medications:  The flagging and interpretation on this report are based on the  following declared medications.  Unexpected results may arise from  inaccuracies in the declared medications.   **Note: The testing scope of this panel  includes these medications:   Hydrocodone  (Norco)  Oxymorphone (Opana)   **Note: The testing scope of this panel does not include the  following reported medications:   Acetaminophen  (Norco)  Albuterol  (Proventil  HFA)  Amlodipine  (Norvasc )  Aspirin   Atorvastatin  (Lipitor)  Azelastine  (Astelin )  Biotin   Budesonide  (Symbicort )  Cetirizine  (Zyrtec )  Formoterol  (Symbicort )  Gabapentin  (Neurontin )  Ipratropium (Atrovent)  Mirabegron  (Myrbetriq )  Montelukast  (Singulair )  Multivitamin  Naloxone  (Narcan )  Nortriptyline  (Pamelor )  Olmesartan (Benicar)  Pantoprazole  (Protonix )  Rizatriptan  (Maxalt )  Tizanidine  (Zanaflex )  Topical  Topical Diclofenac  (Voltaren )  Topical Lidocaine  (Lidoderm )  Trazodone  (Desyrel )  Valacyclovir  (Valtrex )  Vitamin B12  Vitamin B6  Vitamin C   Vitamin D2  Vitamin E  ==================================================================== For clinical consultation, please call [phone]. ====================================================================    No results found for: CBDTHCR, D8THCCBX, D9THCCBX  Laboratory Chemistry Profile   Renal Lab Results  Component Value Date   BUN 12 09/13/2023   CREATININE 0.88 09/13/2023   LABCREA 157 12/28/2018   BCR 11 (L) 09/29/2017   GFRAA 58 (L) 06/18/2019   GFRNONAA >60 09/13/2023    Hepatic Lab Results  Component Value Date   AST 22 09/12/2023   ALT 14 09/12/2023   ALBUMIN 4.2 09/12/2023   ALKPHOS 63 09/12/2023   LIPASE 25 12/30/2019    Electrolytes Lab Results  Component Value Date   NA 141 09/13/2023   K 4.3 09/13/2023   CL 110 09/13/2023   CALCIUM  9.4 09/13/2023   MG 2.0 09/12/2023   PHOS 4.5 12/31/2019    Bone Lab Results  Component Value Date   VD25OH See Scanned report in [place] [facility] 04/10/2022    Inflammation (CRP: Acute Phase) (ESR: Chronic Phase) Lab Results  Component Value Date   LATICACIDVEN 1.1 12/30/2019         Note: Above Lab results  reviewed.  Imaging  DG Foot Complete Right Please see detailed radiograph report in office note.  Assessment  There were no encounter diagnoses.  Plan of Care  Problem-specific:  No problem-specific Assessment & Plan notes found for this encounter.  Ms. Roshni Kreitz has a current medication list which includes the following long-term medication(s): albuterol , albuterol , azelastine , cetirizine , promethazine , rizatriptan , and trazodone .  Pharmacotherapy (Medications Ordered): No orders of the defined types were placed in this encounter.  Orders:  No orders of the defined types were placed in this encounter.  Follow-up plan:   No follow-ups on file.      {There is no content from the last Plan section.}    Recent Visits Date Type Provider Dept  10/27/23 Procedure visit Marcelino Nurse, MD [facility]  09/04/23 Office Visit  Marcelino Nurse, MD [facility]  Showing recent visits within past 90 days and meeting all other requirements Future Appointments Date Type Provider Dept  11/25/23 Office Visit Jacobus Colvin K, NP [facility]  Showing future appointments within next 90 days and meeting all other requirements  I discussed the assessment and treatment plan with the patient. The patient was provided an opportunity to ask questions and all were answered. The patient agreed with the plan and demonstrated an understanding of the instructions.  Patient advised to call back or seek an in-person evaluation if the symptoms or condition worsens.  Duration of encounter: *** minutes.  Note by: Emmy MARLA Blanch, NP Date: 11/25/2023; Time: 3:05 PM

## 2023-11-25 ENCOUNTER — Ambulatory Visit: Attending: Nurse Practitioner | Admitting: Nurse Practitioner

## 2023-11-25 DIAGNOSIS — G8929 Other chronic pain: Secondary | ICD-10-CM

## 2023-11-25 DIAGNOSIS — M5416 Radiculopathy, lumbar region: Secondary | ICD-10-CM | POA: Diagnosis not present

## 2023-11-25 DIAGNOSIS — M792 Neuralgia and neuritis, unspecified: Secondary | ICD-10-CM

## 2023-11-28 ENCOUNTER — Encounter (INDEPENDENT_AMBULATORY_CARE_PROVIDER_SITE_OTHER): Payer: Self-pay | Admitting: Internal Medicine

## 2023-11-28 DIAGNOSIS — G4733 Obstructive sleep apnea (adult) (pediatric): Secondary | ICD-10-CM

## 2023-12-01 ENCOUNTER — Ambulatory Visit: Attending: Nurse Practitioner | Admitting: Nurse Practitioner

## 2023-12-01 ENCOUNTER — Encounter: Payer: Self-pay | Admitting: Nurse Practitioner

## 2023-12-01 ENCOUNTER — Telehealth: Payer: Self-pay | Admitting: Nurse Practitioner

## 2023-12-01 VITALS — BP 124/74 | HR 96 | Temp 97.4°F | Resp 18 | Ht 65.0 in | Wt 156.0 lb

## 2023-12-01 DIAGNOSIS — G8929 Other chronic pain: Secondary | ICD-10-CM | POA: Insufficient documentation

## 2023-12-01 DIAGNOSIS — M5416 Radiculopathy, lumbar region: Secondary | ICD-10-CM | POA: Insufficient documentation

## 2023-12-01 DIAGNOSIS — M792 Neuralgia and neuritis, unspecified: Secondary | ICD-10-CM | POA: Diagnosis present

## 2023-12-01 DIAGNOSIS — G894 Chronic pain syndrome: Secondary | ICD-10-CM | POA: Insufficient documentation

## 2023-12-01 MED ORDER — KETOROLAC TROMETHAMINE 30 MG/ML IJ SOLN
30.0000 mg | Freq: Once | INTRAMUSCULAR | Status: AC
  Administered 2023-12-01: 30 mg via INTRAMUSCULAR
  Filled 2023-12-01: qty 1

## 2023-12-01 MED ORDER — METHOCARBAMOL 1000 MG/10ML IJ SOLN
200.0000 mg | Freq: Once | INTRAMUSCULAR | Status: AC
  Administered 2023-12-01: 200 mg via INTRAMUSCULAR
  Filled 2023-12-01: qty 10

## 2023-12-01 NOTE — Progress Notes (Signed)
 PROVIDER NOTE: Interpretation of information contained herein should be left to medically-trained personnel. Specific patient instructions are provided elsewhere under Patient Instructions section of medical record. This document was created in part using AI and STT-dictation technology, any transcriptional errors that may result from this process are unintentional.  Patient: Rachael Jordan  Service: E/M Encounter  PCP: Zachary Rachael LABOR, MD  DOB: 10-05-1955  DOS: 12/01/2023  Provider: Emmy MARLA Blanch, NP  MRN: [id number]  Delivery: Face-to-face  Specialty: Interventional Pain Management  Type: Established Patient  Setting: Ambulatory outpatient facility  Specialty designation: 09  Referring Prov.: Zachary Rachael LABOR, MD  Location: Outpatient office facility       Purpose:  The patient comes in today for IM therapy. Case was discussed with attending physician.  Subjective:  [name] is a 68 y.o. year old, female patient, who comes today for a nurse visit complaining of Back Pain Her last contact with us  was on 12/01/2023. Severity of the pain is described as a 8 /10.   No notes on file  Objective:  Rachael Jordan  height is 5' 5 (1.651 m) and weight is 156 lb (70.8 kg). Her temporal temperature is 97.4 F (36.3 C) (abnormal). Her blood pressure is 124/74 and her pulse is 96. Her respiration is 18 and oxygen  saturation is 100%.  Body mass index is 25.96 kg/m.  Analgesic: Toradol  30 mg/ML IM injection Robaxin  200 mg IM injection  Allergies:  Patient is allergic to grapefruit extract, morphine  and codeine, oxycodone , oxymorphone, and propoxyphene.  Labs:  Lab Results  Component Value Date   BUN 12 09/13/2023   CREATININE 0.88 09/13/2023   GFRAA 58 (L) 06/18/2019   GFRNONAA >60 09/13/2023    Assessment:  The primary encounter diagnosis was Chronic radicular lumbar pain. Diagnoses of Lumbar radiculopathy, Neuropathic pain of right lower extremity, and Chronic pain syndrome  were also pertinent to this visit.  Attestation: Medical screening examination/treatment/procedure(s) were performed by non-physician practitioner and as supervising physician I was immediately available for consultation/collaboration.  Plan of Care (POC)  Orders:  No orders of the defined types were placed in this encounter.       Medications ordered for procedure: Meds ordered this encounter  Medications   ketorolac  (TORADOL ) 30 MG/ML injection 30 mg   methocarbamol  (ROBAXIN ) injection 200 mg   Medications administered: Rachael Jordan had no medications administered during this visit.  See the medical record for exact dosing, route, and time of administration.        Follow-up plan:   No follow-ups on file.     Recent Visits Date Type Provider Dept  11/25/23 Office Visit Zoeann Mol K, NP [facility]  10/27/23 Procedure visit Marcelino Nurse, MD [facility]  09/04/23 Office Visit Marcelino Nurse, MD [facility]  Showing recent visits within past 90 days and meeting all other requirements Today's Visits Date Type Provider Dept  12/01/23 Office Visit Shantele Reller K, NP [facility]  Showing today's visits and meeting all other requirements Future Appointments No visits were found meeting these conditions. Showing future appointments within next 90 days and meeting all other requirements   Disposition: Discharge home  Discharge (Date  Time): 12/01/2023;   hrs.   Primary Care Physician: Zachary Rachael LABOR, MD Location: [facility] Out[facility] Management [facility] Note by: Aidynn Polendo K Kesi Perrow, NP (TTS technology used. I apologize for any typographical errors that were not detected and corrected.) Date: 12/01/2023; Time: 2:02 PM  Disclaimer:  Medicine is not an Visual merchandiser. The only guarantee in medicine is that nothing is guaranteed. It is important to note that the decision to proceed with this intervention was based on the  information collected from the patient. The Data and conclusions were drawn from the patient's questionnaire, the interview, and the physical examination. Because the information was provided in large part by the patient, it cannot be guaranteed that it has not been purposely or unconsciously manipulated. Every effort has been made to obtain as much relevant data as possible for this evaluation. It is important to note that the conclusions that lead to this procedure are derived in large part from the available data. Always take into account that the treatment will also be dependent on availability of resources and existing treatment guidelines, considered by other Pain Management Practitioners as being common knowledge and practice, at the time of the intervention. For Medico-Legal purposes, it is also important to point out that variation in procedural techniques and pharmacological choices are the acceptable norm. The indications, contraindications, technique, and results of the above procedure should only be interpreted and judged by a Board-Certified Interventional Pain Specialist with extensive familiarity and expertise in the same exact procedure and technique.

## 2023-12-01 NOTE — Telephone Encounter (Signed)
 PT called stated that she was inform by [facility] at her last vv appt. That she could come in to get a Toradol  shot. PT wanted to see if she can come in any day this week beside Thursday. TY

## 2023-12-01 NOTE — Telephone Encounter (Signed)
 Voicemail left with patient that okay to come in for toradol /robaxin  injection

## 2023-12-04 ENCOUNTER — Telehealth: Payer: Self-pay | Admitting: Internal Medicine

## 2023-12-04 ENCOUNTER — Other Ambulatory Visit: Payer: Self-pay | Admitting: Family Medicine

## 2023-12-04 DIAGNOSIS — Z1231 Encounter for screening mammogram for malignant neoplasm of breast: Secondary | ICD-10-CM

## 2023-12-04 NOTE — Telephone Encounter (Signed)
 SS appointment 12/01/23 @ [facility]

## 2023-12-08 ENCOUNTER — Telehealth: Payer: Self-pay | Admitting: Internal Medicine

## 2023-12-08 NOTE — Telephone Encounter (Signed)
 Received 12/05/23 SS. Scanned. [facility] reached out to patient with results-[facility]

## 2023-12-10 NOTE — Procedures (Signed)
 [facility]  Polysomnogram Report Part I                                                               Phone: [phone] Fax: [phone]  Patient Name: Rachael Jordan, Rachael Jordan. Acquisition Number: [id number]  Date of Birth: Jan 04, 1956 Acquisition Date: 11/28/2023  Referring Physician: Elfreda RONAL Bathe, MD     History: The patient is a 68 year old  who was referred for re-evaluation of obstructive sleep apnea. Medical History: asthma, OSA, obesity, anxiety, acid reflux.  Medications: aspirin , Tylenol , Proventil , Ventolin , Elavil , Norvasc , Lipitor, Astelin , biotin , Symbicort , Zyrtec , cholecalciferol , cyanocobalamin , Voltaren  gel, Neurontin , Lidoderm , Singular, Centrum, Narcan , Roxicodone , Miralax , phenergan , vitamin B-6, Maxalt , Zanaflex , trazodone , Valtrex , vitamin C , vitamin E .  Procedure: This routine overnight polysomnogram was performed on the [facility] 5 using the standard diagnostic protocol. This included 6 channels of EEG, 2 channels of EOG, chin EMG, bilateral anterior tibialis EMG, nasal/oral thermistor, PTAF (nasal pressure transducer), chest and abdominal wall movements, EKG, and pulse oximetry.  Description: The total recording time was 404.5 minutes. The total sleep time was 366.0 minutes. There were a total of 15.0 minutes of wakefulness after sleep onset for a slightly reducedsleep efficiency of 90.5%. The latency to sleep onset was within normal limitsat 23.5 minutes. The R sleep onset latency was within normal limits at 81.0 minutes. Sleep parameters, as a percentage of the total sleep time, demonstrated 1.4% of sleep was in N1 sleep, 60.1% N2, 11.6% N3 and 26.9% R sleep. There were a total of 6 arousals for an arousal index of 1.0 arousals per hour of sleep that was normal.  Respiratory monitoring demonstrated   snoring . There were 8 apneas and hypopneas for an Apnea Hypopnea Index of 1.3 apneas and hypopneas per hour of sleep. The REM related apnea hypopnea index was 4.9/hr. of  REM sleep compared to a NREM AHI of 0.0/hr.  The average duration of the respiratory events was 13.4 seconds with a maximum duration of 20.0 seconds. The respiratory events occurred in the supine position. The respiratory events were associated with peripheral oxygen  desaturations on the average to 92%. The lowest oxygen  desaturation associated with a respiratory event was 90%. Additionally, the baseline oxygen  saturation during wakefulness was 98%, during NREM sleep averaged 96%, and during REM sleep averaged 96%. The total duration of oxygen  < 90% was 0.0 minutes.  Cardiac monitoring-  demonstrate transient cardiac decelerations associated with the apneas.  significant cardiac rhythm irregularities.   Periodic limb movement monitoring- did not demonstrate periodic limb movements.      Impression: This routine overnight polysomnogram did not demonstrate significant obstructive sleep apnea due to a low Apnea Hypopnea Index of 1.3 apneas and hypopneas per hour with the lowest desaturation to 90%. All respiratory events occurred in supine, REM sleep. The patient reported weight loss since her initial diagnosis of sleep apnea.   slightly reduced sleep efficiency.  Recommendations:     CPAP is not indicated based on current guidelines. Would recommend weight loss in a patient with a BMI of 27.8.     Rachael RONAL Bathe, MD, [facility] Diplomate ABMS-Pulmonary, Critical Care and Sleep Medicine  Electronically reviewed and digitally signed  [facility] Polysomnogram Report Part II  Phone: [phone] Fax: [phone])  [phone]  Patient last name Rachael Jordan 13.5 in. Acquisition [phone]  Patient first name Rachael H. Weight 152.0 lbs. Started 11/28/2023 at 10:23:08 PM  Birth date [birth year] Height 62.0 in. Stopped 11/29/2023 at 5:18:50 AM  Age 22 BMI 27.8 lb./in2 Duration 404.5  Study Type Adult      Rachael Jordan, RPSGT & Rachael Jordan  Reviewed by: Rachael G. Henke, PhD, ABSM, FAASM Sleep  Data: Lights Out: 10:30:38 PM Sleep Onset: 10:54:08 PM  Lights On: 5:15:08 AM Sleep Efficiency: 90.5 %  Total Recording Time: 404.5 min Sleep Latency (from Lights Off) 23.5 min  Total Sleep Time (TST): 366.0 min R Latency (from Sleep Onset): 81.0 min  Sleep Period Time: 376.5 min Total number of awakenings: 5  Wake during sleep: 10.5 min Wake After Sleep Onset (WASO): 15.0 min   Sleep Data:         Arousal Summary: Stage  Latency from lights out (min) Latency from sleep onset (min) Duration (min) % Total Sleep Time  Normal values  N 1 23.5 0.0 5.0 1.4 (5%)  N 2 24.5 1.0 220.0 60.1 (50%)  N 3 26.5 3.0 42.5 11.6 (20%)  R 104.5 81.0 98.5 26.9 (25%)   Number Index  Spontaneous 4 0.7  Apneas & Hypopneas 2 0.3  RERAs 0 0.0       (Apneas & Hypopneas & RERAs)  (2) (0.3)  Limb Movement 0 0.0  Snore 0 0.0  TOTAL 6 1.0     Respiratory Data:  CA OA MA Apnea Hypopnea* A+ H RERA Total  Number 0 0 0 0 8 8 0 8  Mean Dur (sec) 0.0 0.0 0.0 0.0 13.4 13.4 0.0 13.4  Max Dur (sec) 0.0 0.0 0.0 0.0 20.0 20.0 0.0 20.0  Total Dur (min) 0.0 0.0 0.0 0.0 1.8 1.8 0.0 1.8  % of TST 0.0 0.0 0.0 0.0 0.5 0.5 0.0 0.5  Index (#/h TST) 0.0 0.0 0.0 0.0 1.3 1.3 0.0 1.3  *Hypopneas scored based on 4% or greater desaturation.  Sleep Stage:        REM NREM TST  AHI 4.9 0.0 1.3  RDI 4.9 0.0 1.3           Body Position Data:  Sleep (min) TST (%) REM (min) NREM (min) CA (#) OA (#) MA (#) HYP (#) AHI (#/h) RERA (#) RDI (#/h) Desat (#)  Supine 244.4 66.78 65.0 179.4 0 0 0 8 2.0 0 2.0 19  Non-Supine 121.60 33.22 33.50 88.10 0.00 0.00 0.00 0.00 0.00 0 0.00 4.00  Right: 121.6 33.22 33.5 88.1 0 0 0 0 0.0 0 0.00 4     Snoring: Total number of snoring episodes  0  Total time with snoring    min (   % of sleep)   Oximetry Distribution:             WK REM NREM TOTAL  Average (%)   98 96 96 96  < 90% 0.0 0.0 0.0 0.0  < 80% 0.0 0.0 0.0 0.0  < 70% 0.0 0.0 0.0 0.0  # of Desaturations* 2 14 7 23    Desat Index (#/hour) 3.1 8.5 1.6 3.8  Desat Max (%) 6 6 7 7   Desat Max Dur (sec) 17.0 62.0 48.0 62.0  Approx Min O2 during sleep 90  Approx min O2 during a respiratory event 90  Was Oxygen  added (Y/N) and final rate :    LPM  *Desaturations based on 3% or greater drop from baseline.  Cheyne Stokes Breathing: None Present   Heart Rate Summary:  Average Heart Rate During Sleep 74.3 bpm      Highest Heart Rate During Sleep (95th %) 86.0 bpm      Highest Heart Rate During Sleep 105 bpm      Highest Heart Rate During Recording (TIB) 209 bpm (artifact)   Heart Rate Observations: Event Type # Events   Bradycardia 0 Lowest HR Scored: N/A  Sinus Tachycardia During Sleep 0 Highest HR Scored: N/A  Narrow Complex Tachycardia 0 Highest HR Scored: N/A  Wide Complex Tachycardia 0 Highest HR Scored: N/A  Asystole 0 Longest Pause: N/A  Atrial Fibrillation 0 Duration Longest Event: N/A  Other Arrythmias   Type:    Periodic Limb Movement Data: (Primary legs unless otherwise noted) Total # Limb Movement 0 Limb Movement Index 0.0  Total # PLMS    PLMS Index     Total # PLMS Arousals    PLMS Arousal Index     Percentage Sleep Time with PLMS   min (   % sleep)  Mean Duration limb movements (secs)

## 2024-01-01 ENCOUNTER — Ambulatory Visit
Admission: RE | Admit: 2024-01-01 | Discharge: 2024-01-01 | Disposition: A | Discharge status: Home / Self Care | Source: Ambulatory Visit | Attending: Family Medicine | Admitting: Family Medicine

## 2024-01-01 DIAGNOSIS — Z1231 Encounter for screening mammogram for malignant neoplasm of breast: Secondary | ICD-10-CM | POA: Diagnosis present

## 2024-01-29 ENCOUNTER — Ambulatory Visit (INDEPENDENT_AMBULATORY_CARE_PROVIDER_SITE_OTHER)

## 2024-01-29 ENCOUNTER — Encounter: Payer: Self-pay | Admitting: Podiatry

## 2024-01-29 ENCOUNTER — Ambulatory Visit (INDEPENDENT_AMBULATORY_CARE_PROVIDER_SITE_OTHER): Admitting: Podiatry

## 2024-01-29 VITALS — Ht 65.0 in | Wt 156.0 lb

## 2024-01-29 DIAGNOSIS — M21962 Unspecified acquired deformity of left lower leg: Secondary | ICD-10-CM

## 2024-01-29 DIAGNOSIS — M7752 Other enthesopathy of left foot: Secondary | ICD-10-CM

## 2024-01-29 NOTE — Patient Instructions (Signed)

## 2024-02-02 NOTE — Progress Notes (Signed)
 Subjective: Chief Complaint  Patient presents with   Foot Pain    L 2nd toe has been aching and burning. R foot bunion waiting for surgery. No changes.    68 year old female presents the office today above concerns.  She said that she can get pain to the second toe and along the base of the toe.  She is concerned the third toes going to the second toe.  No recent injuries.   Objective: AAO x3, NAD DP/PT pulses palpable bilaterally, CRT less than 3 seconds On the left foot the third toe at the level of the DIPJ does sit in a somewhat rotated position going under the second toe.  There is localized edema along the sulcus between the 2nd and 3rd toes on the interspace.  This is where she has majority discomfort.  There is no erythema or warmth.  There is no other areas of pinpoint tenderness. No pain with calf compression, swelling, warmth, erythema  Assessment: Capsulitis left foot  Plan: -All treatment options discussed with the patient including all alternatives, risks, complications.  -X-rays obtained reviewed.  Multiple views obtained.  There is no evidence of acute fracture.  Status post osteotomy of the third metatarsal. -Discussed steroid injection she wishes to proceed.  Verbal consent obtained.  I injected the area of maximal tenderness in the second interspace with a mixture of 1 cc Kenalog  10, 0.5 cc Marcaine  plain, 0.5 cc lidocaine  plain without any complications.  Postinjection care discussed.  Tolerated well. -Continue offloading.  Metatarsal pads. -Patient encouraged to call the office with any questions, concerns, change in symptoms.   Donnice JONELLE Fees DPM

## 2024-03-22 ENCOUNTER — Ambulatory Visit: Admitting: Internal Medicine

## 2024-03-30 ENCOUNTER — Ambulatory Visit: Admitting: Internal Medicine

## 2024-03-30 ENCOUNTER — Telehealth: Payer: Self-pay | Admitting: Podiatry

## 2024-03-30 NOTE — Telephone Encounter (Signed)
 DOS- 04/07/2024  DOUBLE OSTEOTOMY RT- 28299 2ND METATARSAL OSTEOTOMY RT- 28308 2ND HAMMERTOE REPAIR RT- 28285  [facility] EFFECTIVE DATE- 03/18/2024  DEDUCTIBLE- $283 REMAINING- $148.75 OOP- $9250 REMAINING- $9115.75 COINSURANCE- 20%  PER [employer] PORTAL, PRIOR AUTH IS NOT REQUIRED FOR CPT CODES 71700, [id number], AND 28308. DECISION ID# [id number]

## 2024-04-06 ENCOUNTER — Telehealth: Payer: Self-pay | Admitting: Podiatry

## 2024-04-06 NOTE — Telephone Encounter (Signed)
 Patient, Rachael Jordan, called back because they said they missed a direct call from Dr. Gershon, who had some questions regarding their surgery tmrw

## 2024-04-07 ENCOUNTER — Telehealth: Payer: Self-pay

## 2024-04-07 ENCOUNTER — Telehealth: Payer: Self-pay | Admitting: Podiatry

## 2024-04-07 ENCOUNTER — Other Ambulatory Visit: Payer: Self-pay | Admitting: Podiatry

## 2024-04-07 DIAGNOSIS — M2041 Other hammer toe(s) (acquired), right foot: Secondary | ICD-10-CM | POA: Diagnosis not present

## 2024-04-07 DIAGNOSIS — M7741 Metatarsalgia, right foot: Secondary | ICD-10-CM | POA: Diagnosis not present

## 2024-04-07 DIAGNOSIS — M2011 Hallux valgus (acquired), right foot: Secondary | ICD-10-CM | POA: Diagnosis not present

## 2024-04-07 MED ORDER — OXYCODONE HCL 15 MG PO TABS: 15.0000 mg | ORAL_TABLET | ORAL | 0 refills | Status: AC | PRN

## 2024-04-07 NOTE — Telephone Encounter (Signed)
 PA request received from [facility] for Oxycodone  HCl 15 mg tablet. PA submitted through CoverMyMeds and waiting on response.  Rachael Jordan  (Key: [id number]) Rx #: [phone]

## 2024-04-07 NOTE — Telephone Encounter (Signed)
 Patient called and said that she just had surgery and the pharmacy is saying that she needs prior authorization in order to get her prescription for pain medication.

## 2024-04-09 ENCOUNTER — Encounter: Payer: Self-pay | Admitting: Podiatry

## 2024-04-09 ENCOUNTER — Other Ambulatory Visit: Payer: Self-pay | Admitting: Podiatry

## 2024-04-09 ENCOUNTER — Telehealth: Payer: Self-pay | Admitting: Lab

## 2024-04-09 MED ORDER — JOURNAVX 50 MG PO TABS: 1.0000 | ORAL_TABLET | Freq: Two times a day (BID) | ORAL | 0 refills | Status: AC

## 2024-04-09 NOTE — Telephone Encounter (Signed)
 Patient states is in severe pain medication not helping is stating is in need of help control her pain.Please advise.

## 2024-04-12 ENCOUNTER — Encounter: Admitting: Podiatry

## 2024-04-13 ENCOUNTER — Telehealth: Payer: Self-pay

## 2024-04-13 NOTE — Telephone Encounter (Signed)
 Patient called stating that her appointment was moved to 1/29 and would like to know if her pain medication will last until then.

## 2024-04-15 ENCOUNTER — Ambulatory Visit (INDEPENDENT_AMBULATORY_CARE_PROVIDER_SITE_OTHER)

## 2024-04-15 ENCOUNTER — Ambulatory Visit (INDEPENDENT_AMBULATORY_CARE_PROVIDER_SITE_OTHER): Admitting: Podiatry

## 2024-04-15 VITALS — BP 153/86 | HR 96 | Temp 98.4°F

## 2024-04-15 DIAGNOSIS — M21611 Bunion of right foot: Secondary | ICD-10-CM

## 2024-04-15 DIAGNOSIS — M21961 Unspecified acquired deformity of right lower leg: Secondary | ICD-10-CM

## 2024-04-15 DIAGNOSIS — M2041 Other hammer toe(s) (acquired), right foot: Secondary | ICD-10-CM | POA: Diagnosis not present

## 2024-04-19 ENCOUNTER — Ambulatory Visit: Admitting: Internal Medicine

## 2024-04-22 ENCOUNTER — Encounter: Admitting: Podiatry

## 2024-04-26 ENCOUNTER — Encounter: Admitting: Podiatry

## 2024-05-06 ENCOUNTER — Encounter: Admitting: Podiatry

## 2024-05-17 ENCOUNTER — Ambulatory Visit: Admitting: Internal Medicine

## 2024-10-06 ENCOUNTER — Encounter: Admitting: Internal Medicine
